# Patient Record
Sex: Female | Born: 1960 | Race: Black or African American | Hispanic: No | Marital: Single | Smoking: Never smoker
Health system: Southern US, Community
[De-identification: ages and names within clinical notes are randomized; demographics above are authoritative.]

## PROBLEM LIST (undated history)

## (undated) DIAGNOSIS — I509 Heart failure, unspecified: Secondary | ICD-10-CM

## (undated) DIAGNOSIS — E119 Type 2 diabetes mellitus without complications: Secondary | ICD-10-CM

## (undated) DIAGNOSIS — I4719 Other supraventricular tachycardia: Secondary | ICD-10-CM

## (undated) DIAGNOSIS — E785 Hyperlipidemia, unspecified: Secondary | ICD-10-CM

## (undated) DIAGNOSIS — B009 Herpesviral infection, unspecified: Secondary | ICD-10-CM

## (undated) DIAGNOSIS — I10 Essential (primary) hypertension: Secondary | ICD-10-CM

## (undated) DIAGNOSIS — R197 Diarrhea, unspecified: Secondary | ICD-10-CM

## (undated) DIAGNOSIS — I519 Heart disease, unspecified: Secondary | ICD-10-CM

## (undated) HISTORY — DX: Heart disease, unspecified: I51.9

## (undated) HISTORY — DX: Diarrhea, unspecified: R19.7

## (undated) HISTORY — DX: Hyperlipidemia, unspecified: E78.5

## (undated) HISTORY — PX: CHOLECYSTECTOMY: SHX55

## (undated) HISTORY — DX: Essential (primary) hypertension: I10

## (undated) HISTORY — DX: Type 2 diabetes mellitus without complications: E11.9

---

## 2001-09-28 ENCOUNTER — Encounter: Payer: Self-pay | Admitting: Orthopaedic Surgery

## 2001-09-28 ENCOUNTER — Ambulatory Visit (HOSPITAL_COMMUNITY): Admission: RE | Admit: 2001-09-28 | Discharge: 2001-09-28 | Payer: Self-pay | Admitting: Orthopaedic Surgery

## 2003-09-20 ENCOUNTER — Emergency Department (HOSPITAL_COMMUNITY): Admission: EM | Admit: 2003-09-20 | Discharge: 2003-09-20 | Payer: Self-pay | Admitting: Emergency Medicine

## 2004-10-30 ENCOUNTER — Emergency Department (HOSPITAL_COMMUNITY): Admission: EM | Admit: 2004-10-30 | Discharge: 2004-10-30 | Payer: Self-pay | Admitting: Emergency Medicine

## 2005-03-28 ENCOUNTER — Ambulatory Visit (HOSPITAL_COMMUNITY): Admission: RE | Admit: 2005-03-28 | Discharge: 2005-03-28 | Payer: Self-pay | Admitting: Family Medicine

## 2006-01-12 ENCOUNTER — Emergency Department (HOSPITAL_COMMUNITY): Admission: EM | Admit: 2006-01-12 | Discharge: 2006-01-12 | Payer: Self-pay | Admitting: Emergency Medicine

## 2006-01-14 ENCOUNTER — Ambulatory Visit (HOSPITAL_COMMUNITY): Admission: RE | Admit: 2006-01-14 | Discharge: 2006-01-14 | Payer: Self-pay | Admitting: Internal Medicine

## 2006-03-31 ENCOUNTER — Ambulatory Visit (HOSPITAL_COMMUNITY): Admission: RE | Admit: 2006-03-31 | Discharge: 2006-03-31 | Payer: Self-pay | Admitting: Internal Medicine

## 2007-04-06 ENCOUNTER — Ambulatory Visit (HOSPITAL_COMMUNITY): Admission: RE | Admit: 2007-04-06 | Discharge: 2007-04-06 | Payer: Self-pay | Admitting: Internal Medicine

## 2007-08-06 ENCOUNTER — Emergency Department (HOSPITAL_COMMUNITY): Admission: EM | Admit: 2007-08-06 | Discharge: 2007-08-06 | Payer: Self-pay | Admitting: Emergency Medicine

## 2007-12-03 ENCOUNTER — Ambulatory Visit (HOSPITAL_COMMUNITY): Admission: RE | Admit: 2007-12-03 | Discharge: 2007-12-03 | Payer: Self-pay | Admitting: Internal Medicine

## 2008-04-06 ENCOUNTER — Ambulatory Visit (HOSPITAL_COMMUNITY): Admission: RE | Admit: 2008-04-06 | Discharge: 2008-04-06 | Payer: Self-pay | Admitting: Internal Medicine

## 2009-04-11 ENCOUNTER — Ambulatory Visit (HOSPITAL_COMMUNITY): Admission: RE | Admit: 2009-04-11 | Discharge: 2009-04-11 | Payer: Self-pay | Admitting: Internal Medicine

## 2010-04-26 ENCOUNTER — Ambulatory Visit (HOSPITAL_COMMUNITY): Admission: RE | Admit: 2010-04-26 | Discharge: 2010-04-26 | Payer: Self-pay | Admitting: Internal Medicine

## 2011-03-27 ENCOUNTER — Emergency Department (HOSPITAL_COMMUNITY)
Admission: EM | Admit: 2011-03-27 | Discharge: 2011-03-27 | Disposition: A | Discharge status: Home / Self Care | Payer: Medicare Other | Attending: Emergency Medicine | Admitting: Emergency Medicine

## 2011-03-27 ENCOUNTER — Emergency Department (HOSPITAL_COMMUNITY): Payer: Medicare Other

## 2011-03-27 DIAGNOSIS — R1013 Epigastric pain: Secondary | ICD-10-CM | POA: Insufficient documentation

## 2011-04-04 ENCOUNTER — Other Ambulatory Visit (HOSPITAL_COMMUNITY): Payer: Self-pay | Admitting: "Endocrinology

## 2011-04-04 DIAGNOSIS — K8020 Calculus of gallbladder without cholecystitis without obstruction: Secondary | ICD-10-CM

## 2011-04-09 ENCOUNTER — Ambulatory Visit (HOSPITAL_COMMUNITY)
Admission: RE | Admit: 2011-04-09 | Discharge: 2011-04-09 | Disposition: A | Discharge status: Home / Self Care | Payer: Medicare Other | Source: Ambulatory Visit | Attending: "Endocrinology | Admitting: "Endocrinology

## 2011-04-09 DIAGNOSIS — K8020 Calculus of gallbladder without cholecystitis without obstruction: Secondary | ICD-10-CM | POA: Insufficient documentation

## 2011-04-09 DIAGNOSIS — R109 Unspecified abdominal pain: Secondary | ICD-10-CM | POA: Insufficient documentation

## 2011-04-10 ENCOUNTER — Other Ambulatory Visit: Payer: Self-pay | Admitting: Obstetrics and Gynecology

## 2011-04-10 ENCOUNTER — Other Ambulatory Visit (HOSPITAL_COMMUNITY)
Admission: RE | Admit: 2011-04-10 | Discharge: 2011-04-10 | Disposition: A | Discharge status: Home / Self Care | Payer: Medicare Other | Source: Ambulatory Visit | Attending: Obstetrics and Gynecology | Admitting: Obstetrics and Gynecology

## 2011-04-10 DIAGNOSIS — Z124 Encounter for screening for malignant neoplasm of cervix: Secondary | ICD-10-CM | POA: Insufficient documentation

## 2011-05-03 ENCOUNTER — Other Ambulatory Visit: Payer: Self-pay | Admitting: General Surgery

## 2011-05-03 ENCOUNTER — Encounter (HOSPITAL_COMMUNITY): Payer: Medicare Other

## 2011-05-03 ENCOUNTER — Other Ambulatory Visit: Payer: Self-pay | Admitting: Infectious Diseases

## 2011-05-03 LAB — BASIC METABOLIC PANEL
CO2: 30 mEq/L (ref 19–32)
Calcium: 9.8 mg/dL (ref 8.4–10.5)
Glucose, Bld: 121 mg/dL — ABNORMAL HIGH (ref 70–99)
Potassium: 4.2 mEq/L (ref 3.5–5.1)
Sodium: 138 mEq/L (ref 135–145)

## 2011-05-03 LAB — CBC
MCH: 29.4 pg (ref 26.0–34.0)
MCHC: 33.3 g/dL (ref 30.0–36.0)
Platelets: 308 10*3/uL (ref 150–400)
RDW: 13.3 % (ref 11.5–15.5)

## 2011-05-03 LAB — HEPATIC FUNCTION PANEL
Albumin: 3.8 g/dL (ref 3.5–5.2)
Indirect Bilirubin: 0.1 mg/dL — ABNORMAL LOW (ref 0.3–0.9)
Total Protein: 7.7 g/dL (ref 6.0–8.3)

## 2011-05-03 LAB — HCG, QUANTITATIVE, PREGNANCY: hCG, Beta Chain, Quant, S: 1 m[IU]/mL (ref <5)

## 2011-05-03 LAB — SURGICAL PCR SCREEN
MRSA, PCR: NEGATIVE
Staphylococcus aureus: POSITIVE — AB

## 2011-05-07 NOTE — H&P (Signed)
  NAMELAURINE, Michelle Morrison                ACCOUNT NO.:  [id number]  MEDICAL RECORD NO.:  [id number]  LOCATION:                                 FACILITY:  PHYSICIAN:  Dalia Heading, M.D.  DATE OF BIRTH:  Sep 26, 1961  DATE OF ADMISSION: DATE OF DISCHARGE:  LH                             HISTORY & PHYSICAL   CHIEF COMPLAINTS:  Gallstones.  HISTORY OF PRESENT ILLNESS:  The patient is a 50 year old black female who presents with worsening right upper quadrant abdominal pain.  This is sometimes made worse with fatty foods.  The pain radiates to the right flank.  No fever, chills, or jaundice have been noted.  PAST MEDICAL HISTORY: 1. Hypertension. 2. High cholesterol levels.  PAST SURGICAL HISTORY:  Unremarkable.  CURRENT MEDICATIONS:  Lisinopril, clonidine, and simvastatin.  ALLERGIES:  No known drug allergies.  REVIEW OF SYSTEMS:  The patient denies drinking or smoking.  She denies any other cardiopulmonary difficulties or bleeding disorders.  FAMILY MEDICAL HISTORY:  Noncontributory.  PHYSICAL EXAMINATION:  GENERAL:  The patient is a well-developed, well- nourished black female in no acute distress. VITAL SIGNS:  She is afebrile with blood pressure that is mildly elevated. HEENT:  No scleral icterus. LUNGS:  Clear to auscultation with equal breath sounds bilaterally. HEART:  Regular rate and rhythm without S3, S4, or murmurs. ABDOMEN:  Soft with tenderness to palpation in the right upper quadrant. No hepatosplenomegaly or hernias are noted.  Ultrasound of the gallbladder reveals cholelithiasis with a normal common bile duct.  IMPRESSION:  Biliary colic, cholelithiasis.  PLAN:  The patient is scheduled for laparoscopic cholecystectomy on May 08, 2011.  Risks and benefits of the procedure including bleeding, infection, hepatobiliary injury, and possibility of an open procedure were fully explained to the patient, gave informed consent.     Dalia Heading,  M.D.     MAJ/MEDQ  D:  04/16/2011  T:  04/17/2011  Job:  [id number]  cc:   Purcell Nails, MD Fax: [phone]  Short Stay at [facility]  Electronically Signed by Franky Macho M.D. on 05/07/2011 12:28:06 PM

## 2011-05-08 ENCOUNTER — Ambulatory Visit (HOSPITAL_COMMUNITY)
Admission: RE | Admit: 2011-05-08 | Discharge: 2011-05-08 | Disposition: A | Discharge status: Home / Self Care | Payer: Medicare Other | Source: Ambulatory Visit | Attending: General Surgery | Admitting: General Surgery

## 2011-05-08 ENCOUNTER — Other Ambulatory Visit: Payer: Self-pay | Admitting: General Surgery

## 2011-05-08 DIAGNOSIS — E7800 Pure hypercholesterolemia, unspecified: Secondary | ICD-10-CM | POA: Insufficient documentation

## 2011-05-08 DIAGNOSIS — Z79899 Other long term (current) drug therapy: Secondary | ICD-10-CM | POA: Insufficient documentation

## 2011-05-08 DIAGNOSIS — Z01812 Encounter for preprocedural laboratory examination: Secondary | ICD-10-CM | POA: Insufficient documentation

## 2011-05-08 DIAGNOSIS — K8010 Calculus of gallbladder with chronic cholecystitis without obstruction: Secondary | ICD-10-CM | POA: Insufficient documentation

## 2011-05-08 DIAGNOSIS — I10 Essential (primary) hypertension: Secondary | ICD-10-CM | POA: Insufficient documentation

## 2011-05-08 DIAGNOSIS — Z01810 Encounter for preprocedural cardiovascular examination: Secondary | ICD-10-CM | POA: Insufficient documentation

## 2011-05-20 NOTE — Op Note (Signed)
NAMESHAMARRA, WARDA                ACCOUNT NO.:  [id number]  MEDICAL RECORD NO.:  [id number]  LOCATION:  [facility]                          FACILITY:  [facility]  PHYSICIAN:  Dalia Heading, M.D.  DATE OF BIRTH:  [birth year]  DATE OF PROCEDURE:  05/08/2011 DATE OF DISCHARGE:                              OPERATIVE REPORT   PREOPERATIVE DIAGNOSES: 1. Cholecystitis. 2. Cholelithiasis.  POSTOPERATIVE DIAGNOSES: 1. Cholecystitis. 2. Cholelithiasis.  PROCEDURE:  Laparoscopic cholecystectomy.  SURGEON:  Dalia Heading, MD  ANESTHESIA:  General endotracheal.  INDICATIONS:  The patient is a 50 year old black female, presents with biliary colic secondary to cholelithiasis.  The risks and benefits of the procedure including bleeding, infection, hepatobiliary injury, and the possibility of an open procedure were fully explained to the patient, gave informed consent.  PROCEDURE NOTE:  The patient was placed in the supine position.  After induction of general endotracheal anesthesia, the abdomen was prepped and draped using the usual sterile technique with DuraPrep.  Surgical site confirmation was performed.  An infraumbilical incision was made down to the fascia.  A Veress needle was introduced into the abdominal cavity and confirmation of placement was done using the saline drop test.  The abdomen was then insufflated to 16 mmHg pressure.  An 11-mm trocar was introduced into the abdominal cavity under direct visualization without difficulty.  The patient was placed in reversed Trendelenburg position.  An additional 11-mm trocar was placed in the epigastric region and 5-mm trocars were placed in the right upper quadrant and right flank regions.  Liver was inspected and noted to be within normal limits.  The gallbladder was retracted superiorly and laterally.  The dissection was begun around the infundibulum of the gallbladder.  The cystic duct was first identified. Its juncture to the  infundibulum was fully identified.  EndoClips were placed proximally and distally on the cystic duct and the cystic duct was divided.  This is likewise done in the cystic artery.  The gallbladder was then freed away from the gallbladder fossa using Bovie electrocautery.  The gallbladder was delivered through the epigastric trocar site using EndoCatch bag.  There was some spillage of stones and these were collected using a second EndoCatch bag.  The gallbladder fossa was inspected.  No abnormal bleeding or bile leakage was noted. Surgicel was placed in the gallbladder fossa.  All fluid and air were then evacuated from the abdominal cavity prior to removal of the trocars.  All wounds were irrigated with normal saline.  All wounds were injected with 0.5 Sensorcaine.  The infraumbilical fascia was reapproximated using an 0 Vicryl interrupted suture.  The epigastric fascia was reapproximated using an 0 Vicryl interrupted suture.  All skin incisions were closed using staples.  Betadine ointment and dry sterile dressings were applied.  All tape and needle counts were correct at the end of the procedure. The patient was extubated in the operating room and went back to recovery room, awake in stable condition.  COMPLICATIONS:  None.  SPECIMEN:  Gallbladder.  ESTIMATED BLOOD LOSS:  Minimal.     Dalia Heading, M.D.     MAJ/MEDQ  D:  05/08/2011  T:  05/08/2011  Job:  [id number]  cc:   Purcell Nails, MD Fax: [phone]  Electronically Signed by Franky Macho M.D. on 05/20/2011 07:27:51 AM

## 2011-09-18 ENCOUNTER — Other Ambulatory Visit (HOSPITAL_COMMUNITY): Payer: Self-pay | Admitting: Internal Medicine

## 2011-09-18 DIAGNOSIS — Z139 Encounter for screening, unspecified: Secondary | ICD-10-CM

## 2011-09-23 ENCOUNTER — Ambulatory Visit (HOSPITAL_COMMUNITY): Payer: Medicare Other

## 2011-09-23 ENCOUNTER — Ambulatory Visit (HOSPITAL_COMMUNITY)
Admission: RE | Admit: 2011-09-23 | Discharge: 2011-09-23 | Disposition: A | Discharge status: Home / Self Care | Payer: Medicare Other | Source: Ambulatory Visit | Attending: Internal Medicine | Admitting: Internal Medicine

## 2011-09-23 DIAGNOSIS — Z1231 Encounter for screening mammogram for malignant neoplasm of breast: Secondary | ICD-10-CM | POA: Insufficient documentation

## 2011-09-23 DIAGNOSIS — Z139 Encounter for screening, unspecified: Secondary | ICD-10-CM

## 2012-04-28 ENCOUNTER — Other Ambulatory Visit (HOSPITAL_COMMUNITY): Payer: Self-pay | Admitting: "Endocrinology

## 2012-04-28 DIAGNOSIS — R109 Unspecified abdominal pain: Secondary | ICD-10-CM | POA: Diagnosis not present

## 2012-04-30 ENCOUNTER — Ambulatory Visit (HOSPITAL_COMMUNITY)
Admission: RE | Admit: 2012-04-30 | Discharge: 2012-04-30 | Disposition: A | Discharge status: Home / Self Care | Payer: Medicare Other | Source: Ambulatory Visit | Attending: "Endocrinology | Admitting: "Endocrinology

## 2012-04-30 DIAGNOSIS — K769 Liver disease, unspecified: Secondary | ICD-10-CM | POA: Diagnosis not present

## 2012-04-30 DIAGNOSIS — R935 Abnormal findings on diagnostic imaging of other abdominal regions, including retroperitoneum: Secondary | ICD-10-CM | POA: Insufficient documentation

## 2012-04-30 DIAGNOSIS — R109 Unspecified abdominal pain: Secondary | ICD-10-CM | POA: Insufficient documentation

## 2012-04-30 DIAGNOSIS — Z9089 Acquired absence of other organs: Secondary | ICD-10-CM | POA: Diagnosis not present

## 2012-05-04 ENCOUNTER — Other Ambulatory Visit (HOSPITAL_COMMUNITY): Payer: Self-pay | Admitting: "Endocrinology

## 2012-05-04 DIAGNOSIS — J309 Allergic rhinitis, unspecified: Secondary | ICD-10-CM | POA: Diagnosis not present

## 2012-05-04 DIAGNOSIS — R109 Unspecified abdominal pain: Secondary | ICD-10-CM

## 2012-05-04 DIAGNOSIS — E119 Type 2 diabetes mellitus without complications: Secondary | ICD-10-CM | POA: Diagnosis not present

## 2012-05-07 ENCOUNTER — Ambulatory Visit (HOSPITAL_COMMUNITY)
Admission: RE | Admit: 2012-05-07 | Discharge: 2012-05-07 | Disposition: A | Discharge status: Home / Self Care | Payer: Medicare Other | Source: Ambulatory Visit | Attending: "Endocrinology | Admitting: "Endocrinology

## 2012-05-07 DIAGNOSIS — R109 Unspecified abdominal pain: Secondary | ICD-10-CM

## 2012-05-07 MED ORDER — IOHEXOL 300 MG/ML  SOLN
100.0000 mL | Freq: Once | INTRAMUSCULAR | Status: AC | PRN
  Administered 2012-05-07: 100 mL via INTRAVENOUS

## 2012-05-20 DIAGNOSIS — M25579 Pain in unspecified ankle and joints of unspecified foot: Secondary | ICD-10-CM | POA: Diagnosis not present

## 2012-08-10 DIAGNOSIS — E7800 Pure hypercholesterolemia, unspecified: Secondary | ICD-10-CM | POA: Diagnosis not present

## 2012-08-10 DIAGNOSIS — I10 Essential (primary) hypertension: Secondary | ICD-10-CM | POA: Diagnosis not present

## 2012-08-10 DIAGNOSIS — Z23 Encounter for immunization: Secondary | ICD-10-CM | POA: Diagnosis not present

## 2012-08-10 DIAGNOSIS — E119 Type 2 diabetes mellitus without complications: Secondary | ICD-10-CM | POA: Diagnosis not present

## 2012-08-26 ENCOUNTER — Other Ambulatory Visit (HOSPITAL_COMMUNITY): Payer: Self-pay | Admitting: Internal Medicine

## 2012-08-26 DIAGNOSIS — Z139 Encounter for screening, unspecified: Secondary | ICD-10-CM

## 2012-09-24 ENCOUNTER — Ambulatory Visit (HOSPITAL_COMMUNITY)
Admission: RE | Admit: 2012-09-24 | Discharge: 2012-09-24 | Disposition: A | Discharge status: Home / Self Care | Payer: Medicare Other | Source: Ambulatory Visit | Attending: Internal Medicine | Admitting: Internal Medicine

## 2012-09-24 DIAGNOSIS — Z1231 Encounter for screening mammogram for malignant neoplasm of breast: Secondary | ICD-10-CM | POA: Diagnosis not present

## 2012-09-24 DIAGNOSIS — Z139 Encounter for screening, unspecified: Secondary | ICD-10-CM

## 2012-11-09 DIAGNOSIS — I10 Essential (primary) hypertension: Secondary | ICD-10-CM | POA: Diagnosis not present

## 2012-11-09 DIAGNOSIS — E119 Type 2 diabetes mellitus without complications: Secondary | ICD-10-CM | POA: Diagnosis not present

## 2012-11-09 DIAGNOSIS — E7800 Pure hypercholesterolemia, unspecified: Secondary | ICD-10-CM | POA: Diagnosis not present

## 2012-12-09 DIAGNOSIS — E119 Type 2 diabetes mellitus without complications: Secondary | ICD-10-CM | POA: Diagnosis not present

## 2013-02-08 DIAGNOSIS — E119 Type 2 diabetes mellitus without complications: Secondary | ICD-10-CM | POA: Diagnosis not present

## 2013-02-08 DIAGNOSIS — I10 Essential (primary) hypertension: Secondary | ICD-10-CM | POA: Diagnosis not present

## 2013-02-08 DIAGNOSIS — E7800 Pure hypercholesterolemia, unspecified: Secondary | ICD-10-CM | POA: Diagnosis not present

## 2013-03-31 ENCOUNTER — Encounter: Payer: Self-pay | Admitting: *Deleted

## 2013-04-01 ENCOUNTER — Encounter: Payer: Self-pay | Admitting: Obstetrics and Gynecology

## 2013-04-01 ENCOUNTER — Other Ambulatory Visit (HOSPITAL_COMMUNITY)
Admission: RE | Admit: 2013-04-01 | Discharge: 2013-04-01 | Disposition: A | Discharge status: Home / Self Care | Payer: Medicare Other | Source: Ambulatory Visit | Attending: Obstetrics and Gynecology | Admitting: Obstetrics and Gynecology

## 2013-04-01 ENCOUNTER — Ambulatory Visit (INDEPENDENT_AMBULATORY_CARE_PROVIDER_SITE_OTHER): Payer: Medicare Other | Admitting: Obstetrics and Gynecology

## 2013-04-01 ENCOUNTER — Telehealth: Payer: Self-pay | Admitting: Internal Medicine

## 2013-04-01 VITALS — BP 170/98 | Ht 62.0 in | Wt 172.4 lb

## 2013-04-01 DIAGNOSIS — Z01419 Encounter for gynecological examination (general) (routine) without abnormal findings: Secondary | ICD-10-CM | POA: Diagnosis not present

## 2013-04-01 DIAGNOSIS — R87810 Cervical high risk human papillomavirus (HPV) DNA test positive: Secondary | ICD-10-CM | POA: Diagnosis not present

## 2013-04-01 DIAGNOSIS — Z1212 Encounter for screening for malignant neoplasm of rectum: Secondary | ICD-10-CM | POA: Diagnosis not present

## 2013-04-01 DIAGNOSIS — Z0000 Encounter for general adult medical examination without abnormal findings: Secondary | ICD-10-CM

## 2013-04-01 DIAGNOSIS — Z124 Encounter for screening for malignant neoplasm of cervix: Secondary | ICD-10-CM

## 2013-04-01 DIAGNOSIS — I10 Essential (primary) hypertension: Secondary | ICD-10-CM

## 2013-04-01 DIAGNOSIS — Z1151 Encounter for screening for human papillomavirus (HPV): Secondary | ICD-10-CM | POA: Insufficient documentation

## 2013-04-01 LAB — HEMOCCULT GUIAC POC 1CARD (OFFICE)

## 2013-04-01 NOTE — Progress Notes (Signed)
  Subjective:     Michelle Morrison is a 52 y.o. female here for a routine exam.  Current complaints: taking BP meds.  Personal health questionnaire reviewed: no.   Gynecologic History No LMP recorded. Patient is postmenopausal. Contraception: abstinence Last Pap: . Results were: normal Last mammogram: . Results were:   Obstetric History OB History   Grav Para Term Preterm Abortions TAB SAB Ect Mult Living   2 2             # Outc Date GA Lbr Len/2nd Wgt Sex Del Anes PTL Lv   1 PAR            2 PAR                  Review of Systems Pertinent items are noted in HPI.    Objective:    General appearance: alert, cooperative and no distress Head: Normocephalic, without obvious abnormality, atraumatic Throat: lips, mucosa, and tongue normal; teeth and gums normal Lungs: clear to auscultation bilaterally Breasts: normal appearance, no masses or tenderness Heart: regular rate and rhythm, S1, S2 normal, no murmur, click, rub or gallop Abdomen: normal findings: bowel sounds normal, no masses palpable and no organomegaly Pelvic: cervix normal in appearance, external genitalia normal, no adnexal masses or tenderness, no cervical motion tenderness, rectovaginal septum normal, uterus normal size, shape, and consistency and vagina normal without discharge Extremities: extremities normal, atraumatic, no cyanosis or edema    Assessment:    Healthy female exam.    Plan:    Follow up in: 2 years.

## 2013-04-01 NOTE — Patient Instructions (Addendum)
Be sure you take your bp meds. Today's BP is 170/98 That is too high.

## 2013-04-04 NOTE — Telephone Encounter (Signed)
Opened in error

## 2013-05-31 DIAGNOSIS — I10 Essential (primary) hypertension: Secondary | ICD-10-CM | POA: Diagnosis not present

## 2013-05-31 DIAGNOSIS — E119 Type 2 diabetes mellitus without complications: Secondary | ICD-10-CM | POA: Diagnosis not present

## 2013-08-26 ENCOUNTER — Other Ambulatory Visit (HOSPITAL_COMMUNITY): Payer: Self-pay | Admitting: Internal Medicine

## 2013-08-26 DIAGNOSIS — Z139 Encounter for screening, unspecified: Secondary | ICD-10-CM

## 2013-09-02 DIAGNOSIS — I10 Essential (primary) hypertension: Secondary | ICD-10-CM | POA: Diagnosis not present

## 2013-09-02 DIAGNOSIS — Z23 Encounter for immunization: Secondary | ICD-10-CM | POA: Diagnosis not present

## 2013-09-02 DIAGNOSIS — E119 Type 2 diabetes mellitus without complications: Secondary | ICD-10-CM | POA: Diagnosis not present

## 2013-09-27 ENCOUNTER — Ambulatory Visit (HOSPITAL_COMMUNITY)
Admission: RE | Admit: 2013-09-27 | Discharge: 2013-09-27 | Disposition: A | Discharge status: Home / Self Care | Payer: Medicare Other | Source: Ambulatory Visit | Attending: Internal Medicine | Admitting: Internal Medicine

## 2013-09-27 DIAGNOSIS — Z139 Encounter for screening, unspecified: Secondary | ICD-10-CM

## 2013-09-27 DIAGNOSIS — Z1231 Encounter for screening mammogram for malignant neoplasm of breast: Secondary | ICD-10-CM | POA: Diagnosis not present

## 2013-12-16 DIAGNOSIS — E669 Obesity, unspecified: Secondary | ICD-10-CM | POA: Diagnosis not present

## 2013-12-16 DIAGNOSIS — E7800 Pure hypercholesterolemia, unspecified: Secondary | ICD-10-CM | POA: Diagnosis not present

## 2013-12-16 DIAGNOSIS — E119 Type 2 diabetes mellitus without complications: Secondary | ICD-10-CM | POA: Diagnosis not present

## 2013-12-16 DIAGNOSIS — I10 Essential (primary) hypertension: Secondary | ICD-10-CM | POA: Diagnosis not present

## 2014-03-17 DIAGNOSIS — E119 Type 2 diabetes mellitus without complications: Secondary | ICD-10-CM | POA: Diagnosis not present

## 2014-03-17 DIAGNOSIS — I10 Essential (primary) hypertension: Secondary | ICD-10-CM | POA: Diagnosis not present

## 2014-06-16 DIAGNOSIS — I10 Essential (primary) hypertension: Secondary | ICD-10-CM | POA: Diagnosis not present

## 2014-06-16 DIAGNOSIS — IMO0001 Reserved for inherently not codable concepts without codable children: Secondary | ICD-10-CM | POA: Diagnosis not present

## 2014-06-20 DIAGNOSIS — L259 Unspecified contact dermatitis, unspecified cause: Secondary | ICD-10-CM | POA: Diagnosis not present

## 2014-07-25 ENCOUNTER — Other Ambulatory Visit: Payer: Self-pay

## 2014-07-25 ENCOUNTER — Telehealth: Payer: Self-pay

## 2014-07-25 DIAGNOSIS — Z1211 Encounter for screening for malignant neoplasm of colon: Secondary | ICD-10-CM

## 2014-07-25 NOTE — Telephone Encounter (Signed)
Pt received letter to be triaged. Please call her back at [phone]

## 2014-07-25 NOTE — Telephone Encounter (Signed)
Gastroenterology Pre-Procedure Review  Request Date: 07/25/2014  Requesting Physician: Dr. Legrand Rams  PATIENT REVIEW QUESTIONS: The patient responded to the following health history questions as indicated:    1. Diabetes Melitis: YES 2. Joint replacements in the past 12 months: no 3. Major health problems in the past 3 months: no 4. Has an artificial valve or MVP: no 5. Has a defibrillator: no 6. Has been advised in past to take antibiotics in advance of a procedure like teeth cleaning: no    MEDICATIONS & ALLERGIES:    Patient reports the following regarding taking any blood thinners:   Plavix? no Aspirin? no Coumadin? no  Patient confirms/reports the following medications:  Current Outpatient Prescriptions  Medication Sig Dispense Refill  . amLODipine (NORVASC) 10 MG tablet Take 10 mg by mouth daily.      . hydrochlorothiazide (HYDRODIURIL) 12.5 MG tablet Take 12.5 mg by mouth daily.      Marland Kitchen lisinopril (PRINIVIL,ZESTRIL) 20 MG tablet Take 20 mg by mouth daily.      . metFORMIN (GLUCOPHAGE) 500 MG tablet Take 500 mg by mouth 2 (two) times daily with a meal.      . simvastatin (ZOCOR) 40 MG tablet Take 40 mg by mouth daily.       No current facility-administered medications for this visit.    Patient confirms/reports the following allergies:  No Known Allergies  No orders of the defined types were placed in this encounter.    AUTHORIZATION INFORMATION Primary Insurance:   ID #:   Group #:  Pre-Cert / Auth required:  Pre-Cert / Auth #:   Secondary Insurance:   ID #:  Group #:  Pre-Cert / Auth required:  Pre-Cert / Auth #:   SCHEDULE INFORMATION: Procedure has been scheduled as follows:  Date:  08/26/2014                       Time: 9:30 AM Location: [facility] Short Stay  This Gastroenterology Pre-Precedure Review Form is being routed to the following provider(s): Barney Drain, MD

## 2014-07-26 NOTE — Telephone Encounter (Signed)
MOVI PREP SPLIT DOSING, REGULAR BREAKFAST. CLEAR LIQUIDS AFTER 9 AM.  CONTINUE GLUCOPHAGE.  HOLD HCTZ ON DAY BEFORE AND DAY OF TCS.

## 2014-07-27 MED ORDER — PEG-KCL-NACL-NASULF-NA ASC-C 100 G PO SOLR: 1.0000 | ORAL | Status: DC

## 2014-07-27 NOTE — Telephone Encounter (Signed)
Rx sent to the pharmacy and instructions mailed to pt.  

## 2014-07-27 NOTE — Addendum Note (Signed)
Addended by: Everardo All on: 07/27/2014 11:43 AM   Modules accepted: Orders

## 2014-08-02 ENCOUNTER — Telehealth: Payer: Self-pay

## 2014-08-02 MED ORDER — PEG 3350-KCL-NA BICARB-NACL 420 G PO SOLR: 4000.0000 mL | ORAL | Status: DC

## 2014-08-02 NOTE — Telephone Encounter (Signed)
Pt cannot afford the Movie Prep. I told her I will send in the [facility] and she will call and let me know if she can afford that and I will mail the instructions for that. She said she may have to reshedule to Nov but she will let me know.

## 2014-08-02 NOTE — Addendum Note (Signed)
Addended by: Everardo All on: 08/02/2014 12:16 PM   Modules accepted: Orders

## 2014-08-02 NOTE — Telephone Encounter (Signed)
REVIEWED. AGREE. NO ADDITIONAL RECOMMENDATIONS. 

## 2014-08-02 NOTE — Telephone Encounter (Signed)
Please call patient regarding her colonoscopy .  She called 08/02/14 [phone] cell [phone]  Has question about a medication she was trying to get filled.

## 2014-08-09 NOTE — OR Nursing (Signed)
Patient stated she was scheduled for a colonoscopy on 10/2. Patient brought medication list to the hospital today to make sure we had the correct list. List updated in [facility].

## 2014-08-22 ENCOUNTER — Encounter (HOSPITAL_COMMUNITY): Payer: Self-pay | Admitting: Pharmacy Technician

## 2014-08-26 ENCOUNTER — Ambulatory Visit (HOSPITAL_COMMUNITY)
Admission: RE | Admit: 2014-08-26 | Discharge: 2014-08-26 | Disposition: A | Discharge status: Home / Self Care | Payer: Medicare Other | Source: Ambulatory Visit | Attending: Gastroenterology | Admitting: Gastroenterology

## 2014-08-26 ENCOUNTER — Encounter (HOSPITAL_COMMUNITY): Payer: Self-pay | Admitting: *Deleted

## 2014-08-26 ENCOUNTER — Encounter (HOSPITAL_COMMUNITY)
Admission: RE | Disposition: A | Discharge status: Home / Self Care | Payer: Self-pay | Source: Ambulatory Visit | Attending: Gastroenterology

## 2014-08-26 DIAGNOSIS — Q438 Other specified congenital malformations of intestine: Secondary | ICD-10-CM | POA: Diagnosis not present

## 2014-08-26 DIAGNOSIS — I10 Essential (primary) hypertension: Secondary | ICD-10-CM | POA: Diagnosis not present

## 2014-08-26 DIAGNOSIS — Z79899 Other long term (current) drug therapy: Secondary | ICD-10-CM | POA: Diagnosis not present

## 2014-08-26 DIAGNOSIS — K635 Polyp of colon: Secondary | ICD-10-CM | POA: Diagnosis not present

## 2014-08-26 DIAGNOSIS — E119 Type 2 diabetes mellitus without complications: Secondary | ICD-10-CM | POA: Insufficient documentation

## 2014-08-26 DIAGNOSIS — Z1211 Encounter for screening for malignant neoplasm of colon: Secondary | ICD-10-CM | POA: Diagnosis not present

## 2014-08-26 DIAGNOSIS — E785 Hyperlipidemia, unspecified: Secondary | ICD-10-CM | POA: Diagnosis not present

## 2014-08-26 DIAGNOSIS — D125 Benign neoplasm of sigmoid colon: Secondary | ICD-10-CM | POA: Insufficient documentation

## 2014-08-26 DIAGNOSIS — K648 Other hemorrhoids: Secondary | ICD-10-CM | POA: Diagnosis not present

## 2014-08-26 HISTORY — PX: COLONOSCOPY: SHX5424

## 2014-08-26 LAB — GLUCOSE, CAPILLARY: Glucose-Capillary: 108 mg/dL — ABNORMAL HIGH (ref 70–99)

## 2014-08-26 SURGERY — COLONOSCOPY
Anesthesia: Moderate Sedation

## 2014-08-26 MED ORDER — SODIUM CHLORIDE 0.9 % IV SOLN
INTRAVENOUS | Status: DC
  Administered 2014-08-26: 08:00:00 via INTRAVENOUS

## 2014-08-26 MED ORDER — PROMETHAZINE HCL 25 MG/ML IJ SOLN
INTRAMUSCULAR | Status: DC | PRN
  Administered 2014-08-26: 12.5 mg via INTRAVENOUS

## 2014-08-26 MED ORDER — PROMETHAZINE HCL 25 MG/ML IJ SOLN
INTRAMUSCULAR | Status: AC
  Filled 2014-08-26: qty 1

## 2014-08-26 MED ORDER — HYDRALAZINE HCL 20 MG/ML IJ SOLN
10.0000 mg | Freq: Once | INTRAMUSCULAR | Status: AC
  Administered 2014-08-26: 10 mg via INTRAVENOUS

## 2014-08-26 MED ORDER — STERILE WATER FOR IRRIGATION IR SOLN
Status: DC | PRN
  Administered 2014-08-26: 09:00:00

## 2014-08-26 MED ORDER — MEPERIDINE HCL 100 MG/ML IJ SOLN
INTRAMUSCULAR | Status: DC | PRN
  Administered 2014-08-26 (×4): 25 mg via INTRAVENOUS

## 2014-08-26 MED ORDER — SODIUM CHLORIDE 0.9 % IJ SOLN
INTRAMUSCULAR | Status: AC
  Filled 2014-08-26: qty 10

## 2014-08-26 MED ORDER — HYDRALAZINE HCL 20 MG/ML IJ SOLN
INTRAMUSCULAR | Status: AC
  Filled 2014-08-26: qty 1

## 2014-08-26 MED ORDER — MIDAZOLAM HCL 5 MG/5ML IJ SOLN
INTRAMUSCULAR | Status: AC
  Filled 2014-08-26: qty 10

## 2014-08-26 MED ORDER — MEPERIDINE HCL 100 MG/ML IJ SOLN
INTRAMUSCULAR | Status: DC
Start: 2014-08-26 — End: 2014-08-26
  Filled 2014-08-26: qty 2

## 2014-08-26 MED ORDER — MIDAZOLAM HCL 5 MG/5ML IJ SOLN
INTRAMUSCULAR | Status: DC | PRN
  Administered 2014-08-26: 1 mg via INTRAVENOUS
  Administered 2014-08-26 (×2): 2 mg via INTRAVENOUS

## 2014-08-26 NOTE — Discharge Instructions (Signed)
You had 1 polyp removed. You have internal hemorrhoids.   FOLLOW A HIGH FIBER DIET. AVOID ITEMS THAT CAUSE BLOATING & GAS. SEE INFO BELOW.  YOUR BIOPSY RESULTS SHOULD BE BACK IN 14 DAYS.  Next colonoscopy in 5-10 years.    Colonoscopy Care After Read the instructions outlined below and refer to this sheet in the next week. These discharge instructions provide you with general information on caring for yourself after you leave the hospital. While your treatment has been planned according to the most current medical practices available, unavoidable complications occasionally occur. If you have any problems or questions after discharge, call DR. Zadrian Mccauley, [phone].  ACTIVITY  You may resume your regular activity, but move at a slower pace for the next 24 hours.   Take frequent rest periods for the next 24 hours.   Walking will help get rid of the air and reduce the bloated feeling in your belly (abdomen).   No driving for 24 hours (because of the medicine (anesthesia) used during the test).   You may shower.   Do not sign any important legal documents or operate any machinery for 24 hours (because of the anesthesia used during the test).    NUTRITION  Drink plenty of fluids.   You may resume your normal diet as instructed by your doctor.   Begin with a light meal and progress to your normal diet. Heavy or fried foods are harder to digest and may make you feel sick to your stomach (nauseated).   Avoid alcoholic beverages for 24 hours or as instructed.    MEDICATIONS  You may resume your normal medications.   WHAT YOU CAN EXPECT TODAY  Some feelings of bloating in the abdomen.   Passage of more gas than usual.   Spotting of blood in your stool or on the toilet paper  .  IF YOU HAD POLYPS REMOVED DURING THE COLONOSCOPY:  Eat a soft diet IF YOU HAVE NAUSEA, BLOATING, ABDOMINAL PAIN, OR VOMITING.    FINDING OUT THE RESULTS OF YOUR TEST Not all test results are  available during your visit. DR. Oneida Alar WILL CALL YOU WITHIN 7 DAYS OF YOUR PROCEDUE WITH YOUR RESULTS. Do not assume everything is normal if you have not heard from DR. Gleen Ripberger IN ONE WEEK, CALL HER OFFICE AT [phone].  SEEK IMMEDIATE MEDICAL ATTENTION AND CALL THE OFFICE: [phone] IF:  You have more than a spotting of blood in your stool.   Your belly is swollen (abdominal distention).   You are nauseated or vomiting.   You have a temperature over 101F.   You have abdominal pain or discomfort that is severe or gets worse throughout the day.   High-Fiber Diet A high-fiber diet changes your normal diet to include more whole grains, legumes, fruits, and vegetables. Changes in the diet involve replacing refined carbohydrates with unrefined foods. The calorie level of the diet is essentially unchanged. The Dietary Reference Intake (recommended amount) for adult males is 38 grams per day. For adult females, it is 25 grams per day. Pregnant and lactating women should consume 28 grams of fiber per day. Fiber is the intact part of a plant that is not broken down during digestion. Functional fiber is fiber that has been isolated from the plant to provide a beneficial effect in the body. PURPOSE  Increase stool bulk.   Ease and regulate bowel movements.   Lower cholesterol.  INDICATIONS THAT YOU NEED MORE FIBER  Constipation and hemorrhoids.   Uncomplicated diverticulosis (  intestine condition) and irritable bowel syndrome.   Weight management.   As a protective measure against hardening of the arteries (atherosclerosis), diabetes, and cancer.   GUIDELINES FOR INCREASING FIBER IN THE DIET  Start adding fiber to the diet slowly. A gradual increase of about 5 more grams (2 slices of whole-wheat bread, 2 servings of most fruits or vegetables, or 1 bowl of high-fiber cereal) per day is best. Too rapid an increase in fiber may result in constipation, flatulence, and bloating.   Drink  enough water and fluids to keep your urine clear or pale yellow. Water, juice, or caffeine-free drinks are recommended. Not drinking enough fluid may cause constipation.   Eat a variety of high-fiber foods rather than one type of fiber.   Try to increase your intake of fiber through using high-fiber foods rather than fiber pills or supplements that contain small amounts of fiber.   The goal is to change the types of food eaten. Do not supplement your present diet with high-fiber foods, but replace foods in your present diet.  INCLUDE A VARIETY OF FIBER SOURCES  Replace refined and processed grains with whole grains, canned fruits with fresh fruits, and incorporate other fiber sources. White rice, white breads, and most bakery goods contain little or no fiber.   Brown whole-grain rice, buckwheat oats, and many fruits and vegetables are all good sources of fiber. These include: broccoli, Brussels sprouts, cabbage, cauliflower, beets, sweet potatoes, white potatoes (skin on), carrots, tomatoes, eggplant, squash, berries, fresh fruits, and dried fruits.   Cereals appear to be the richest source of fiber. Cereal fiber is found in whole grains and bran. Bran is the fiber-rich outer coat of cereal grain, which is largely removed in refining. In whole-grain cereals, the bran remains. In breakfast cereals, the largest amount of fiber is found in those with "bran" in their names. The fiber content is sometimes indicated on the label.   You may need to include additional fruits and vegetables each day.   In baking, for 1 cup white flour, you may use the following substitutions:   1 cup whole-wheat flour minus 2 tablespoons.   1/2 cup white flour plus 1/2 cup whole-wheat flour.   Polyps, Colon  A polyp is extra tissue that grows inside your body. Colon polyps grow in the large intestine. The large intestine, also called the colon, is part of your digestive system. It is a long, hollow tube at the end of  your digestive tract where your body makes and stores stool. Most polyps are not dangerous. They are benign. This means they are not cancerous. But over time, some types of polyps can turn into cancer. Polyps that are smaller than a pea are usually not harmful. But larger polyps could someday become or may already be cancerous. To be safe, doctors remove all polyps and test them.   WHO GETS POLYPS? Anyone can get polyps, but certain people are more likely than others. You may have a greater chance of getting polyps if:  You are over 50.   You have had polyps before.   Someone in your family has had polyps.   Someone in your family has had cancer of the large intestine.   Find out if someone in your family has had polyps. You may also be more likely to get polyps if you:   Eat a lot of fatty foods   Smoke   Drink alcohol   Do not exercise  Eat too much  TREATMENT  The caregiver will remove the polyp during sigmoidoscopy or colonoscopy.    PREVENTION There is not one sure way to prevent polyps. You might be able to lower your risk of getting them if you:  Eat more fruits and vegetables and less fatty food.   Do not smoke.   Avoid alcohol.   Exercise every day.   Lose weight if you are overweight.   Eating more calcium and folate can also lower your risk of getting polyps. Some foods that are rich in calcium are milk, cheese, and broccoli. Some foods that are rich in folate are chickpeas, kidney beans, and spinach.   Hemorrhoids Hemorrhoids are dilated (enlarged) veins around the rectum. Sometimes clots will form in the veins. This makes them swollen and painful. These are called thrombosed hemorrhoids. Causes of hemorrhoids include:  Constipation.   Straining to have a bowel movement.   HEAVY LIFTING HOME CARE INSTRUCTIONS  Eat a well balanced diet and drink 6 to 8 glasses of water every day to avoid constipation. You may also use a bulk laxative.   Avoid  straining to have bowel movements.   Keep anal area dry and clean.   Do not use a donut shaped pillow or sit on the toilet for long periods. This increases blood pooling and pain.   Move your bowels when your body has the urge; this will require less straining and will decrease pain and pressure.

## 2014-08-26 NOTE — H&P (Signed)
  Primary Care Physician:  Rosita Fire, MD Primary Gastroenterologist:  Dr. Oneida Alar  Pre-Procedure History & Physical: HPI:  Michelle Morrison is a 53 y.o. female here for COLON CANCER SCREENING.  Past Medical History  Diagnosis Date  . Hyperlipidemia   . Hypertension   . Diabetes mellitus without complication     Past Surgical History  Procedure Laterality Date  . Cesarean section    . Cholecystectomy      Prior to Admission medications   Medication Sig Start Date End Date Taking? Authorizing Provider  amLODipine (NORVASC) 10 MG tablet Take 10 mg by mouth daily.   Yes Historical Provider, MD  hydrochlorothiazide (HYDRODIURIL) 12.5 MG tablet Take 12.5 mg by mouth daily.   Yes Historical Provider, MD  lisinopril (PRINIVIL,ZESTRIL) 40 MG tablet Take 40 mg by mouth daily.   Yes Historical Provider, MD  metFORMIN (GLUCOPHAGE) 500 MG tablet Take 500 mg by mouth 2 (two) times daily with a meal.   Yes Historical Provider, MD  polyethylene glycol-electrolytes (TRILYTE) 420 G solution Take 4,000 mLs by mouth as directed. 08/02/14  Yes [name], MD  simvastatin (ZOCOR) 40 MG tablet Take 40 mg by mouth daily.   Yes Historical Provider, MD  lisinopril (PRINIVIL,ZESTRIL) 20 MG tablet Take 40 mg by mouth daily.     Historical Provider, MD  peg 3350 powder (MOVIPREP) 100 G SOLR Take 1 kit (200 g total) by mouth as directed. 07/27/14   [name], MD    Allergies as of 07/25/2014  . (No Known Allergies)    Family History  Problem Relation Age of Onset  . Hypertension Mother   . Diabetes Mother   . Colon cancer Neg Hx     History   Social History  . Marital Status: Single    Spouse Name: N/A    Number of Children: N/A  . Years of Education: N/A   Occupational History  . Not on file.   Social History Main Topics  . Smoking status: Never Smoker   . Smokeless tobacco: Not on file  . Alcohol Use: No  . Drug Use: No  . Sexual Activity: No   Other Topics Concern  . Not on  file   Social History Narrative  . No narrative on file    Review of Systems: See HPI, otherwise negative ROS   Physical Exam: BP 208/89  Pulse 85  Temp(Src) 98.2 F (36.8 C) (Oral)  Resp 16  Ht _0  (1.575 m)  Wt 172 lb (78.019 kg)  BMI 31.45 kg/m2  SpO2 100% General:   Alert,  pleasant and cooperative in NAD Head:  Normocephalic and atraumatic. Neck:  Supple; Lungs:  Clear throughout to auscultation.    Heart:  Regular rate and rhythm. Abdomen:  Soft, nontender and nondistended. Normal bowel sounds, without guarding, and without rebound.   Neurologic:  Alert and  oriented x4;  grossly normal neurologically.  Impression/Plan:     SCREENING  Plan:  1. TCS TODAY

## 2014-08-26 NOTE — OR Nursing (Signed)
Dr. Oneida Alar notified by Lurline Del RN of increased BP 208/89 in left arm and 215/97 in right arm. Orders received and carried out.

## 2014-08-27 NOTE — Op Note (Signed)
[facility] [address] [place], [address]   COLONOSCOPY PROCEDURE REPORT  PATIENT: Michelle Morrison, Michelle Morrison  MR#: [id number] BIRTHDATE: [birth year] , 63  yrs. old GENDER: female ENDOSCOPIST: Barney Drain, MD REFERRED YT:[id number] [name], M.D. PROCEDURE DATE:  08/26/2014 PROCEDURE:   Colonoscopy with snare polypectomy INDICATIONS:average risk for colon cancer. MEDICATIONS: Promethazine (Phenergan) 12.5 mg IV, Demerol 100 mg IV, and Versed 5 mg IV  DESCRIPTION OF PROCEDURE:    Physical exam was performed.  Informed consent was obtained from the patient after explaining the benefits, risks, and alternatives to procedure.  The patient was connected to monitor and placed in left lateral position. Continuous oxygen was provided by nasal cannula and IV medicine administered through an indwelling cannula.  After administration of sedation and rectal exam, the patients rectum was intubated and the EC-3890Li ([id number])  colonoscope was advanced under direct visualization to the ileum.  The scope was removed slowly by carefully examining the color, texture, anatomy, and integrity mucosa on the way out.  The patient was recovered in endoscopy and discharged home in satisfactory condition.    COLON FINDINGS: The examined terminal ileum appeared to be normal. , A sessile polyp measuring 6 mm in size was found in the sigmoid colon.  A polypectomy was performed using snare cautery.  The resection was complete, the polyp tissue was completely retrieved and sent to histology, The LEFT colon IS REdundant.  Manual abdominal counter-pressure was used to reach the cecum.  The patient was moved on to their back to reach the cecum, and Moderate sized internal hemorrhoids were found.  PREP QUALITY: good.  CECAL W/D TIME: 15 mins          COMPLICATIONS: None  ENDOSCOPIC IMPRESSION: 1.   ONE COLON POLYP REMOVED 2.   The LEFT colon IS redundant 3.   Moderate sized internal  hemorrhoids  RECOMMENDATIONS: FOLLOW A HIGH FIBER DIET.  AVOID ITEMS THAT CAUSE BLOATING & GAS. BIOPSY RESULTS SHOULD BE BACK IN 14 DAYS. Next colonoscopy in 5-10 years with an overtube.      _______________________________ eSigned:  Barney Drain, MD 09-15-2014 7:42 AM   CPT CODES: ICD CODES:  The ICD and CPT codes recommended by this software are interpretations from the data that the clinical staff has captured with the software.  The verification of the translation of this report to the ICD and CPT codes and modifiers is the sole responsibility of the health care institution and practicing physician where this report was generated.  [employer] will not be held responsible for the validity of the ICD and CPT codes included on this report.  AMA assumes no liability for data contained or not contained herein. CPT is a Designer, television/film set of the [employer].

## 2014-08-29 ENCOUNTER — Encounter (HOSPITAL_COMMUNITY): Payer: Self-pay | Admitting: Gastroenterology

## 2014-09-08 ENCOUNTER — Telehealth: Payer: Self-pay | Admitting: Gastroenterology

## 2014-09-08 NOTE — Telephone Encounter (Signed)
Pt had colonoscopy on 10/2 by SF and was checking on her results. Please call [phone]

## 2014-09-09 NOTE — Telephone Encounter (Signed)
Called and told pt I will call her when Dr. Oneida Alar signs off, probably by first of the week.

## 2014-09-12 NOTE — Telephone Encounter (Signed)
Please call pt. She had HYPERPLASTIC POLYPS removed from her colon. TCS in 10 years. High fiber diet.  

## 2014-09-12 NOTE — Telephone Encounter (Signed)
Reminder in epic °

## 2014-09-12 NOTE — Telephone Encounter (Signed)
LMOM to call.

## 2014-09-14 ENCOUNTER — Telehealth: Payer: Self-pay | Admitting: Gastroenterology

## 2014-09-14 NOTE — Telephone Encounter (Signed)
Pt wanted to know if her results were back. Please call [phone]

## 2014-09-16 ENCOUNTER — Other Ambulatory Visit (HOSPITAL_COMMUNITY): Payer: Self-pay | Admitting: Internal Medicine

## 2014-09-16 DIAGNOSIS — Z1231 Encounter for screening mammogram for malignant neoplasm of breast: Secondary | ICD-10-CM

## 2014-09-19 NOTE — Telephone Encounter (Signed)
Pt is aware of results. 

## 2014-09-19 NOTE — Telephone Encounter (Signed)
SEE TC OCT 15.

## 2014-09-20 DIAGNOSIS — E119 Type 2 diabetes mellitus without complications: Secondary | ICD-10-CM | POA: Diagnosis not present

## 2014-09-20 DIAGNOSIS — I10 Essential (primary) hypertension: Secondary | ICD-10-CM | POA: Diagnosis not present

## 2014-09-20 DIAGNOSIS — Z23 Encounter for immunization: Secondary | ICD-10-CM | POA: Diagnosis not present

## 2014-09-22 ENCOUNTER — Ambulatory Visit (HOSPITAL_COMMUNITY)
Admission: RE | Admit: 2014-09-22 | Discharge: 2014-09-22 | Disposition: A | Discharge status: Home / Self Care | Payer: Medicare Other | Source: Ambulatory Visit | Attending: Internal Medicine | Admitting: Internal Medicine

## 2014-09-22 DIAGNOSIS — Z1231 Encounter for screening mammogram for malignant neoplasm of breast: Secondary | ICD-10-CM

## 2014-09-26 ENCOUNTER — Encounter (HOSPITAL_COMMUNITY): Payer: Self-pay | Admitting: Gastroenterology

## 2014-09-27 ENCOUNTER — Ambulatory Visit: Payer: Medicare Other | Admitting: Obstetrics & Gynecology

## 2014-09-29 ENCOUNTER — Ambulatory Visit: Payer: Medicare Other | Admitting: Obstetrics & Gynecology

## 2014-10-04 ENCOUNTER — Ambulatory Visit (INDEPENDENT_AMBULATORY_CARE_PROVIDER_SITE_OTHER): Payer: Medicare Other | Admitting: Obstetrics & Gynecology

## 2014-10-04 ENCOUNTER — Encounter: Payer: Self-pay | Admitting: Obstetrics & Gynecology

## 2014-10-04 VITALS — BP 140/80 | Ht 61.0 in | Wt 174.0 lb

## 2014-10-04 DIAGNOSIS — R300 Dysuria: Secondary | ICD-10-CM

## 2014-10-04 LAB — POCT URINALYSIS DIPSTICK
Glucose, UA: NEGATIVE
KETONES UA: NEGATIVE
Leukocytes, UA: NEGATIVE
Nitrite, UA: NEGATIVE
PROTEIN UA: 1
RBC UA: NEGATIVE

## 2014-10-04 MED ORDER — CIPROFLOXACIN HCL 500 MG PO TABS: 500.0000 mg | ORAL_TABLET | Freq: Two times a day (BID) | ORAL | Status: DC

## 2014-10-04 NOTE — Progress Notes (Signed)
Patient ID: Michelle Morrison, female   DOB: Aug 24, 1961, 54 y.o.   MRN: [id number]   Chief Complaint  Patient presents with  . gyn visit    burn with urination x2 day.    Pt with burning and frequency urgency for the last 2 days Has history of UTI a few in the past Pain is crampy and moderate  UA is negative today  Blood pressure 140/80, height 5\' 1"  (1.549 m), weight 174 lb (78.926 kg). Gen WDWN female NAD  Cervicitis vs cystitis:  Culture pending:    Cipro 500 BID x 7 days which would cover both possible issues

## 2014-10-05 LAB — URINE CULTURE
COLONY COUNT: NO GROWTH
Organism ID, Bacteria: NO GROWTH

## 2014-10-28 DIAGNOSIS — E1165 Type 2 diabetes mellitus with hyperglycemia: Secondary | ICD-10-CM | POA: Diagnosis not present

## 2014-10-28 DIAGNOSIS — I10 Essential (primary) hypertension: Secondary | ICD-10-CM | POA: Diagnosis not present

## 2014-10-28 DIAGNOSIS — E785 Hyperlipidemia, unspecified: Secondary | ICD-10-CM | POA: Diagnosis not present

## 2014-10-28 DIAGNOSIS — E669 Obesity, unspecified: Secondary | ICD-10-CM | POA: Diagnosis not present

## 2014-11-08 DIAGNOSIS — E119 Type 2 diabetes mellitus without complications: Secondary | ICD-10-CM | POA: Diagnosis not present

## 2014-12-05 DIAGNOSIS — Z0000 Encounter for general adult medical examination without abnormal findings: Secondary | ICD-10-CM | POA: Diagnosis not present

## 2014-12-15 ENCOUNTER — Encounter: Payer: Self-pay | Admitting: Obstetrics & Gynecology

## 2014-12-15 ENCOUNTER — Ambulatory Visit (INDEPENDENT_AMBULATORY_CARE_PROVIDER_SITE_OTHER): Payer: Medicare Other | Admitting: Obstetrics & Gynecology

## 2014-12-15 VITALS — BP 150/100 | Wt 175.0 lb

## 2014-12-15 DIAGNOSIS — K611 Rectal abscess: Secondary | ICD-10-CM

## 2014-12-15 MED ORDER — POLYETHYLENE GLYCOL 3350 17 GM/SCOOP PO POWD: 1.0000 | Freq: Once | ORAL | Status: DC

## 2014-12-15 MED ORDER — SULFAMETHOXAZOLE-TRIMETHOPRIM 800-160 MG PO TABS: 1.0000 | ORAL_TABLET | Freq: Two times a day (BID) | ORAL | Status: DC

## 2014-12-15 NOTE — Progress Notes (Signed)
Patient ID: Michelle Morrison, female   DOB: 20-Mar-1961, 54 y.o.   MRN: [id number] Pt presents complaining of an area around her ectum that is tender, never had before No bleeding  Blood pressure 150/100, weight 175 lb (79.379 kg).   No burning with urination, frequency or urgency No nausea, vomiting or diarrhea Nor fever chills or other constitutional symptoms   Exam Perirectal area small infection, not abscess NEFG Vagina normal  Peri rectal infection  Plan Bactrim DS x 10 days

## 2015-01-05 ENCOUNTER — Ambulatory Visit: Payer: Medicare Other | Admitting: Obstetrics & Gynecology

## 2015-01-10 ENCOUNTER — Encounter: Payer: Self-pay | Admitting: Obstetrics & Gynecology

## 2015-01-10 ENCOUNTER — Ambulatory Visit (INDEPENDENT_AMBULATORY_CARE_PROVIDER_SITE_OTHER): Payer: Medicare Other | Admitting: Obstetrics & Gynecology

## 2015-01-10 VITALS — BP 178/80 | Ht 64.0 in | Wt 171.0 lb

## 2015-01-10 DIAGNOSIS — K611 Rectal abscess: Secondary | ICD-10-CM | POA: Diagnosis not present

## 2015-01-10 MED ORDER — METRONIDAZOLE 500 MG PO TABS: 500.0000 mg | ORAL_TABLET | Freq: Two times a day (BID) | ORAL | Status: DC

## 2015-01-10 MED ORDER — CIPROFLOXACIN HCL 500 MG PO TABS: 500.0000 mg | ORAL_TABLET | Freq: Two times a day (BID) | ORAL | Status: DC

## 2015-01-10 NOTE — Progress Notes (Signed)
Patient ID: Michelle Morrison, female   DOB: [birth year], 54 y.o.   MRN: [id number] Patient ID: Michelle Morrison, female   DOB: 1961-11-22, 54 y.o.   MRN: [id number] See previous note:  Pt presents complaining of an area around her ectum that is tender, never had before No bleeding  Blood pressure 178/80, height 5\' 4"  (1.626 m), weight 171 lb (77.565 kg).   No burning with urination, frequency or urgency No nausea, vomiting or diarrhea Nor fever chills or other constitutional symptoms   Exam Perirectal area small infection, not abscess NEFG Vagina normal  Peri rectal infection  Plan Bactrim DS x 10 days  Was doing well came off antibiotic and has gotten worse again, no drainage  Area is noted and definitely smaller and less tender, but still present Will change to cipro and flagyl and will have see dr Arnoldo Morale in 2 weeks

## 2015-01-16 DIAGNOSIS — I10 Essential (primary) hypertension: Secondary | ICD-10-CM | POA: Diagnosis not present

## 2015-01-16 DIAGNOSIS — E1165 Type 2 diabetes mellitus with hyperglycemia: Secondary | ICD-10-CM | POA: Diagnosis not present

## 2015-01-18 DIAGNOSIS — K029 Dental caries, unspecified: Secondary | ICD-10-CM | POA: Diagnosis not present

## 2015-01-26 ENCOUNTER — Ambulatory Visit (INDEPENDENT_AMBULATORY_CARE_PROVIDER_SITE_OTHER): Payer: Medicare Other | Admitting: Obstetrics & Gynecology

## 2015-01-26 ENCOUNTER — Encounter: Payer: Self-pay | Admitting: Obstetrics & Gynecology

## 2015-01-26 VITALS — BP 140/80 | HR 68 | Wt 168.0 lb

## 2015-01-26 DIAGNOSIS — K611 Rectal abscess: Secondary | ICD-10-CM

## 2015-01-26 DIAGNOSIS — K529 Noninfective gastroenteritis and colitis, unspecified: Secondary | ICD-10-CM | POA: Diagnosis not present

## 2015-01-26 DIAGNOSIS — K521 Toxic gastroenteritis and colitis: Secondary | ICD-10-CM

## 2015-01-26 DIAGNOSIS — T3695XA Adverse effect of unspecified systemic antibiotic, initial encounter: Secondary | ICD-10-CM

## 2015-01-26 NOTE — Progress Notes (Signed)
Patient ID: Michelle Morrison, female   DOB: 07/07/1961, 54 y.o.   MRN: [id number]   Chief Complaint  Patient presents with  . Follow-up     HPI:    Pt presents for re examination of peri rectal abscess.  She has had now 2 courses of antibiotics, Bactrim and most recently flagyl and ciprofloxacin New complaints: watery diarrhea for the last few days, several episodes per day Location:  rectum. Quality:  sharp. Severity:  intense. Timing:  Daily several episodes. Duration:  Last few days. Context:  With bowel movements. Modifying factors:   Signs/Symptoms:    Problem Pertinent ROS:       GU:  No burning with urination, frequency or urgency, no discharge GI:  No nausea, vomiting + diarrhea, no abdominal pain except with diarrhea Constitutional:  No fever chills or other constitutional symptoms, no flu like sx    Extended ROS:     Review of Systems   Respiratory: Negative for cough, hemoptysis, sputum production, shortness of breath, wheezing and stridor.   Cardiovascular: Negative for chest pain, palpitations, orthopnea, claudication, leg swelling and PND.   Musculoskeletal: Negative for myalgias, back pain, joint pain and falls.  Skin: Negative for itching and rash.  Neurological: Negative for dizziness, tingling, tremors, sensory change, speech change, focal weakness, seizures, loss of consciousness, weakness and headaches.    [place]:             Past Medical History  Diagnosis Date  . Hyperlipidemia   . Hypertension   . Diabetes mellitus without complication     Past Surgical History  Procedure Laterality Date  . Cesarean section    . Cholecystectomy    . Colonoscopy N/A 08/26/2014    Procedure: COLONOSCOPY;  Surgeon: [name], MD;  Location: [facility];  Service: Endoscopy;  Laterality: N/A;  9:30 AM - moved to 8:30 - Ginger to notify pt    OB History    Gravida Para Term Preterm AB TAB SAB Ectopic Multiple Living   2 2              No Known  Allergies  History   Social History  . Marital Status: Single    Spouse Name: N/A  . Number of Children: N/A  . Years of Education: N/A   Social History Main Topics  . Smoking status: Never Smoker   . Smokeless tobacco: Never Used  . Alcohol Use: No  . Drug Use: No  . Sexual Activity: No   Other Topics Concern  . None   Social History Narrative    Family History  Problem Relation Age of Onset  . Hypertension Mother   . Diabetes Mother   . Colon cancer Neg Hx      Examination:  Vitals:  Blood pressure 140/80, pulse 68, weight 168 lb (76.204 kg).    Physical Examination:     Rectum no masses or lesions non tender Perineum normal no masses non tender Vulva no masses non tender NEFG    Labs were ordered today:  C dificile toxin Imaging studies were not ordered today:    Lab tests were not reviewed today:    Imaging studies were not reviewed today:    Prescription Drug Management:  New Prescriptions: none Renewed Prescriptions:  none Current prescription changes:  none   Impression/Plan(Problem Based): 1.  Peri rectal abscess      (follow up of a pre-existing problem) : Additional workup is not needed:  If recurs  will have Dr Arnoldo Morale see patient for possible surgical intervention  2.  Antibiotic associated diarrhea      (new problem) : Additional workup is needed:  C dificile toxin will be obtained     Follow Up:   Will follow up after results of the C difficile test

## 2015-01-27 NOTE — Addendum Note (Signed)
Addended by: Florian Buff on: 01/27/2015 09:41 AM   Modules accepted: Level of Service

## 2015-01-30 DIAGNOSIS — R197 Diarrhea, unspecified: Secondary | ICD-10-CM | POA: Diagnosis not present

## 2015-01-30 DIAGNOSIS — K611 Rectal abscess: Secondary | ICD-10-CM | POA: Diagnosis not present

## 2015-01-30 DIAGNOSIS — K529 Noninfective gastroenteritis and colitis, unspecified: Secondary | ICD-10-CM | POA: Diagnosis not present

## 2015-02-02 LAB — CLOSTRIDIUM DIFFICILE EIA: C DIFFICILE TOXINS A+ B, EIA: NEGATIVE

## 2015-02-14 DIAGNOSIS — I10 Essential (primary) hypertension: Secondary | ICD-10-CM | POA: Diagnosis not present

## 2015-02-14 DIAGNOSIS — E1165 Type 2 diabetes mellitus with hyperglycemia: Secondary | ICD-10-CM | POA: Diagnosis not present

## 2015-03-25 DIAGNOSIS — N189 Chronic kidney disease, unspecified: Secondary | ICD-10-CM | POA: Diagnosis not present

## 2015-03-25 DIAGNOSIS — I10 Essential (primary) hypertension: Secondary | ICD-10-CM | POA: Diagnosis not present

## 2015-04-20 ENCOUNTER — Encounter: Payer: Self-pay | Admitting: Obstetrics & Gynecology

## 2015-04-20 ENCOUNTER — Other Ambulatory Visit (HOSPITAL_COMMUNITY)
Admission: RE | Admit: 2015-04-20 | Discharge: 2015-04-20 | Disposition: A | Discharge status: Home / Self Care | Payer: Medicare Other | Source: Ambulatory Visit | Attending: Obstetrics & Gynecology | Admitting: Obstetrics & Gynecology

## 2015-04-20 ENCOUNTER — Ambulatory Visit (INDEPENDENT_AMBULATORY_CARE_PROVIDER_SITE_OTHER): Payer: Medicare Other | Admitting: Obstetrics & Gynecology

## 2015-04-20 VITALS — BP 130/70 | HR 64 | Ht 61.0 in | Wt 170.0 lb

## 2015-04-20 DIAGNOSIS — Z124 Encounter for screening for malignant neoplasm of cervix: Secondary | ICD-10-CM | POA: Diagnosis not present

## 2015-04-20 DIAGNOSIS — Z01419 Encounter for gynecological examination (general) (routine) without abnormal findings: Secondary | ICD-10-CM | POA: Diagnosis not present

## 2015-04-20 DIAGNOSIS — Z1211 Encounter for screening for malignant neoplasm of colon: Secondary | ICD-10-CM

## 2015-04-20 DIAGNOSIS — Z1151 Encounter for screening for human papillomavirus (HPV): Secondary | ICD-10-CM | POA: Insufficient documentation

## 2015-04-20 DIAGNOSIS — R87810 Cervical high risk human papillomavirus (HPV) DNA test positive: Secondary | ICD-10-CM | POA: Diagnosis not present

## 2015-04-20 DIAGNOSIS — Z1212 Encounter for screening for malignant neoplasm of rectum: Secondary | ICD-10-CM | POA: Diagnosis not present

## 2015-04-20 NOTE — Progress Notes (Signed)
Patient ID: Michelle Morrison, female   DOB: [birth year], 54 y.o.   MRN: [id number] Subjective:     Michelle Morrison is a 54 y.o. female here for a routine exam.  No LMP recorded. Patient is postmenopausal. G2P2 Birth Control Method:  Is menopausal Menstrual Calendar(currently): postmenopausal  Current complaints: None.   Current acute medical issues:  Diabetes and hypertension   Recent Gynecologic History No LMP recorded. Patient is postmenopausal. Last Pap: 2015,  normal Last mammogram: 2016,  normal  Past Medical History  Diagnosis Date  . Hyperlipidemia   . Hypertension   . Diabetes mellitus without complication     Past Surgical History  Procedure Laterality Date  . Cesarean section    . Cholecystectomy    . Colonoscopy N/A 08/26/2014    Procedure: COLONOSCOPY;  Surgeon: [name], MD;  Location: [facility];  Service: Endoscopy;  Laterality: N/A;  9:30 AM - moved to 8:30 - Ginger to notify pt    OB History    Gravida Para Term Preterm AB TAB SAB Ectopic Multiple Living   2 2              History   Social History  . Marital Status: Single    Spouse Name: N/A  . Number of Children: N/A  . Years of Education: N/A   Social History Main Topics  . Smoking status: Never Smoker   . Smokeless tobacco: Never Used  . Alcohol Use: No  . Drug Use: No  . Sexual Activity: No   Other Topics Concern  . None   Social History Narrative    Family History  Problem Relation Age of Onset  . Hypertension Mother   . Diabetes Mother   . Colon cancer Neg Hx      Current outpatient prescriptions:  .  amLODipine (NORVASC) 10 MG tablet, Take 10 mg by mouth daily., Disp: , Rfl:  .  hydrochlorothiazide (HYDRODIURIL) 12.5 MG tablet, Take 12.5 mg by mouth daily., Disp: , Rfl:  .  lisinopril (PRINIVIL,ZESTRIL) 40 MG tablet, Take 40 mg by mouth daily., Disp: , Rfl:  .  metFORMIN (GLUCOPHAGE) 500 MG tablet, Take 500 mg by mouth 2 (two) times daily with a meal., Disp: , Rfl:  .   simvastatin (ZOCOR) 40 MG tablet, Take 40 mg by mouth daily., Disp: , Rfl:  .  ciprofloxacin (CIPRO) 500 MG tablet, Take 1 tablet (500 mg total) by mouth 2 (two) times daily. (Patient not taking: Reported on 12/15/2014), Disp: 14 tablet, Rfl: 0 .  ciprofloxacin (CIPRO) 500 MG tablet, Take 1 tablet (500 mg total) by mouth 2 (two) times daily. (Patient not taking: Reported on 01/26/2015), Disp: 20 tablet, Rfl: 0 .  lisinopril (PRINIVIL,ZESTRIL) 20 MG tablet, Take 40 mg by mouth daily. , Disp: , Rfl:  .  metroNIDAZOLE (FLAGYL) 500 MG tablet, Take 1 tablet (500 mg total) by mouth 2 (two) times daily. (Patient not taking: Reported on 01/26/2015), Disp: 20 tablet, Rfl: 0 .  peg 3350 powder (MOVIPREP) 100 G SOLR, Take 1 kit (200 g total) by mouth as directed. (Patient not taking: Reported on 04/20/2015), Disp: 1 kit, Rfl: 0 .  polyethylene glycol powder (GLYCOLAX/MIRALAX) powder, Take 255 g by mouth once. Take 1 scoop daily (Patient not taking: Reported on 01/26/2015), Disp: 255 g, Rfl: 0 .  sulfamethoxazole-trimethoprim (BACTRIM DS,SEPTRA DS) 800-160 MG per tablet, Take 1 tablet by mouth 2 (two) times daily. (Patient not taking: Reported on 04/20/2015), Disp: 20 tablet,  Rfl: 1  Review of Systems  Review of Systems  Constitutional: Negative for fever, chills, weight loss, malaise/fatigue and diaphoresis.  HENT: Negative for hearing loss, ear pain, nosebleeds, congestion, sore throat, neck pain, tinnitus and ear discharge.   Eyes: Negative for blurred vision, double vision, photophobia, pain, discharge and redness.  Respiratory: Negative for cough, hemoptysis, sputum production, shortness of breath, wheezing and stridor.   Cardiovascular: Negative for chest pain, palpitations, orthopnea, claudication, leg swelling and PND.  Gastrointestinal: negative for abdominal pain. Negative for heartburn, nausea, vomiting, diarrhea, constipation, blood in stool and melena.  Genitourinary: Negative for dysuria, urgency,  frequency, hematuria and flank pain.  Musculoskeletal: Negative for myalgias, back pain, joint pain and falls.  Skin: Negative for itching and rash.  Neurological: Negative for dizziness, tingling, tremors, sensory change, speech change, focal weakness, seizures, loss of consciousness, weakness and headaches.  Endo/Heme/Allergies: Negative for environmental allergies and polydipsia. Does not bruise/bleed easily.  Psychiatric/Behavioral: Negative for depression, suicidal ideas, hallucinations, memory loss and substance abuse. The patient is not nervous/anxious and does not have insomnia.        Objective:  Blood pressure 130/70, pulse 64, height $RemoveBe'5\' 1"'[id number]$  (1.549 m), weight 170 lb (77.111 kg).   Physical Exam  Vitals reviewed. Constitutional: She is oriented to person, place, and time. She appears well-developed and well-nourished.  HENT:  Head: Normocephalic and atraumatic.        Right Ear: External ear normal.  Left Ear: External ear normal.  Nose: Nose normal.  Mouth/Throat: Oropharynx is clear and moist.  Eyes: Conjunctivae and EOM are normal. Pupils are equal, round, and reactive to light. Right eye exhibits no discharge. Left eye exhibits no discharge. No scleral icterus.  Neck: Normal range of motion. Neck supple. No tracheal deviation present. No thyromegaly present.  Cardiovascular: Normal rate, regular rhythm, normal heart sounds and intact distal pulses.  Exam reveals no gallop and no friction rub.   No murmur heard. Respiratory: Effort normal and breath sounds normal. No respiratory distress. She has no wheezes. She has no rales. She exhibits no tenderness.  GI: Soft. Bowel sounds are normal. She exhibits no distension and no mass. There is no tenderness. There is no rebound and no guarding.  Genitourinary:  Breasts no masses skin changes or nipple changes bilaterally      Vulva is normal without lesions Vagina is pink moist without discharge Cervix normal in appearance and pap  is done  Uterus is normal size shape and contour Adnexa is negative with normal sized ovaries  {Rectal    hemoccult negative, normal tone, no masses  Musculoskeletal: Normal range of motion. She exhibits no edema and no tenderness.  Neurological: She is alert and oriented to person, place, and time. She has normal reflexes. She displays normal reflexes. No cranial nerve deficit. She exhibits normal muscle tone. Coordination normal.  Skin: Skin is warm and dry. No rash noted. No erythema. No pallor.  Psychiatric: She has a normal mood and affect. Her behavior is normal. Judgment and thought content normal.       Assessment:    Healthy female exam.    Plan:    Mammogram ordered. Follow up in: 1 year.

## 2015-04-20 NOTE — Addendum Note (Signed)
Addended by: Farley Ly on: 04/20/2015 11:41 AM   Modules accepted: Orders

## 2015-04-24 DIAGNOSIS — I10 Essential (primary) hypertension: Secondary | ICD-10-CM | POA: Diagnosis not present

## 2015-04-24 DIAGNOSIS — E1165 Type 2 diabetes mellitus with hyperglycemia: Secondary | ICD-10-CM | POA: Diagnosis not present

## 2015-04-26 LAB — CYTOLOGY - PAP

## 2015-05-02 ENCOUNTER — Telehealth: Payer: Self-pay | Admitting: *Deleted

## 2015-05-02 NOTE — Telephone Encounter (Signed)
-----   Message from Florian Buff, MD sent at 05/01/2015  2:46 PM EDT ----- Please schedule a colposcopy:  ASCUS + HPV  Thanks,  Dr Elonda Husky ----- Message -----    From: [facility] Interface    Sent: 04/28/2015   1:49 PM      To: Florian Buff, MD

## 2015-05-08 ENCOUNTER — Telehealth: Payer: Self-pay | Admitting: Obstetrics and Gynecology

## 2015-05-08 NOTE — Telephone Encounter (Signed)
-----   Message from Florian Buff, MD sent at 05/01/2015  2:46 PM EDT ----- Please schedule a colposcopy:  ASCUS + HPV  Thanks,  Dr Elonda Husky ----- Message -----    From: [facility] Interface    Sent: 04/28/2015   1:49 PM      To: Florian Buff, MD

## 2015-05-08 NOTE — Telephone Encounter (Signed)
Pt informed of abnormal pap (ASCUS and + HPV) from 04/20/2015. Pt informed of Dr. Chipper Oman' recommendation for Colposcopy and call was transferred to front staff to schedule appt. Pt was instructed in the colposcopy procedure and all questions answered.

## 2015-05-09 ENCOUNTER — Encounter: Payer: Self-pay | Admitting: Obstetrics & Gynecology

## 2015-05-09 ENCOUNTER — Ambulatory Visit (INDEPENDENT_AMBULATORY_CARE_PROVIDER_SITE_OTHER): Payer: Medicare Other | Admitting: Obstetrics & Gynecology

## 2015-05-09 VITALS — BP 170/70 | HR 74 | Wt 169.0 lb

## 2015-05-09 DIAGNOSIS — R87611 Atypical squamous cells cannot exclude high grade squamous intraepithelial lesion on cytologic smear of cervix (ASC-H): Secondary | ICD-10-CM | POA: Diagnosis not present

## 2015-05-09 DIAGNOSIS — M79672 Pain in left foot: Secondary | ICD-10-CM | POA: Diagnosis not present

## 2015-05-09 DIAGNOSIS — M7662 Achilles tendinitis, left leg: Secondary | ICD-10-CM | POA: Diagnosis not present

## 2015-05-09 DIAGNOSIS — IMO0002 Reserved for concepts with insufficient information to code with codable children: Secondary | ICD-10-CM

## 2015-05-09 NOTE — Progress Notes (Signed)
Patient ID: Michelle Morrison, female   DOB: [birth year], 54 y.o.   MRN: [id number]   Colposcopy Procedure Note  Indications: Pap smear 1 months ago showed: ASCUS with POSITIVE high risk HPV. The prior pap showed no abnormalities.  Prior cervical/vaginal disease: n/a. Prior cervical treatment: n/a.  Smoker:  Yes.   New sexual partner:  Yes.    : time frame:  Stared 9 months ago  History of abnormal Pap: no  Procedure Details  The risks and benefits of the procedure and Written informed consent obtained.  Speculum placed in vagina and excellent visualization of cervix achieved, cervix swabbed x 3 with acetic acid solution.  Findings: Cervix: no visible lesions, no mosaicism, no punctation and no abnormal vasculature; Marland Kitchen Vaginal inspection: normal without visible lesions. Vulvar colposcopy: vulvar colposcopy not performed.  Specimens: none  Complications: none.  Plan: Follow up pap smear 1 year

## 2015-05-23 ENCOUNTER — Encounter (HOSPITAL_COMMUNITY): Payer: Self-pay | Admitting: *Deleted

## 2015-05-23 ENCOUNTER — Emergency Department (HOSPITAL_COMMUNITY)
Admission: EM | Admit: 2015-05-23 | Discharge: 2015-05-23 | Disposition: A | Discharge status: Home / Self Care | Payer: No Typology Code available for payment source | Attending: Emergency Medicine | Admitting: Emergency Medicine

## 2015-05-23 ENCOUNTER — Emergency Department (HOSPITAL_COMMUNITY): Payer: No Typology Code available for payment source

## 2015-05-23 DIAGNOSIS — Z79899 Other long term (current) drug therapy: Secondary | ICD-10-CM | POA: Insufficient documentation

## 2015-05-23 DIAGNOSIS — M545 Low back pain: Secondary | ICD-10-CM | POA: Diagnosis not present

## 2015-05-23 DIAGNOSIS — Y92410 Unspecified street and highway as the place of occurrence of the external cause: Secondary | ICD-10-CM | POA: Insufficient documentation

## 2015-05-23 DIAGNOSIS — Y998 Other external cause status: Secondary | ICD-10-CM | POA: Diagnosis not present

## 2015-05-23 DIAGNOSIS — E785 Hyperlipidemia, unspecified: Secondary | ICD-10-CM | POA: Diagnosis not present

## 2015-05-23 DIAGNOSIS — E119 Type 2 diabetes mellitus without complications: Secondary | ICD-10-CM | POA: Diagnosis not present

## 2015-05-23 DIAGNOSIS — S233XXA Sprain of ligaments of thoracic spine, initial encounter: Secondary | ICD-10-CM | POA: Diagnosis not present

## 2015-05-23 DIAGNOSIS — S29012A Strain of muscle and tendon of back wall of thorax, initial encounter: Secondary | ICD-10-CM | POA: Diagnosis not present

## 2015-05-23 DIAGNOSIS — I10 Essential (primary) hypertension: Secondary | ICD-10-CM | POA: Diagnosis not present

## 2015-05-23 DIAGNOSIS — M549 Dorsalgia, unspecified: Secondary | ICD-10-CM | POA: Diagnosis not present

## 2015-05-23 DIAGNOSIS — S39012A Strain of muscle, fascia and tendon of lower back, initial encounter: Secondary | ICD-10-CM | POA: Insufficient documentation

## 2015-05-23 DIAGNOSIS — S3992XA Unspecified injury of lower back, initial encounter: Secondary | ICD-10-CM | POA: Diagnosis present

## 2015-05-23 DIAGNOSIS — S199XXA Unspecified injury of neck, initial encounter: Secondary | ICD-10-CM | POA: Diagnosis not present

## 2015-05-23 DIAGNOSIS — S239XXA Sprain of unspecified parts of thorax, initial encounter: Secondary | ICD-10-CM | POA: Diagnosis not present

## 2015-05-23 DIAGNOSIS — S299XXA Unspecified injury of thorax, initial encounter: Secondary | ICD-10-CM | POA: Diagnosis not present

## 2015-05-23 DIAGNOSIS — Y9389 Activity, other specified: Secondary | ICD-10-CM | POA: Insufficient documentation

## 2015-05-23 MED ORDER — IBUPROFEN 800 MG PO TABS
800.0000 mg | ORAL_TABLET | Freq: Once | ORAL | Status: AC
  Administered 2015-05-23: 800 mg via ORAL
  Filled 2015-05-23: qty 1

## 2015-05-23 MED ORDER — OXYCODONE-ACETAMINOPHEN 5-325 MG PO TABS
1.0000 | ORAL_TABLET | Freq: Once | ORAL | Status: AC
  Administered 2015-05-23: 1 via ORAL
  Filled 2015-05-23: qty 1
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
## 2015-05-23 NOTE — Discharge Instructions (Signed)
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
## 2015-05-25 DIAGNOSIS — E1165 Type 2 diabetes mellitus with hyperglycemia: Secondary | ICD-10-CM | POA: Diagnosis not present

## 2015-05-25 DIAGNOSIS — I10 Essential (primary) hypertension: Secondary | ICD-10-CM | POA: Diagnosis not present

## 2015-06-02 DIAGNOSIS — I10 Essential (primary) hypertension: Secondary | ICD-10-CM | POA: Diagnosis not present

## 2015-06-02 DIAGNOSIS — E1165 Type 2 diabetes mellitus with hyperglycemia: Secondary | ICD-10-CM | POA: Diagnosis not present

## 2015-06-27 ENCOUNTER — Ambulatory Visit (HOSPITAL_COMMUNITY): Payer: No Typology Code available for payment source | Attending: Internal Medicine | Admitting: Physical Therapy

## 2015-06-27 DIAGNOSIS — Z658 Other specified problems related to psychosocial circumstances: Secondary | ICD-10-CM | POA: Insufficient documentation

## 2015-06-27 DIAGNOSIS — Z7409 Other reduced mobility: Secondary | ICD-10-CM | POA: Insufficient documentation

## 2015-06-27 DIAGNOSIS — M545 Low back pain: Secondary | ICD-10-CM | POA: Insufficient documentation

## 2015-06-27 DIAGNOSIS — R29898 Other symptoms and signs involving the musculoskeletal system: Secondary | ICD-10-CM | POA: Insufficient documentation

## 2015-07-17 DIAGNOSIS — E669 Obesity, unspecified: Secondary | ICD-10-CM | POA: Diagnosis not present

## 2015-07-17 DIAGNOSIS — M549 Dorsalgia, unspecified: Secondary | ICD-10-CM | POA: Diagnosis not present

## 2015-07-17 DIAGNOSIS — E1165 Type 2 diabetes mellitus with hyperglycemia: Secondary | ICD-10-CM | POA: Diagnosis not present

## 2015-07-17 DIAGNOSIS — E785 Hyperlipidemia, unspecified: Secondary | ICD-10-CM | POA: Diagnosis not present

## 2015-07-17 DIAGNOSIS — I10 Essential (primary) hypertension: Secondary | ICD-10-CM | POA: Diagnosis not present

## 2015-07-20 ENCOUNTER — Ambulatory Visit (HOSPITAL_COMMUNITY): Payer: No Typology Code available for payment source | Admitting: Physical Therapy

## 2015-07-20 DIAGNOSIS — M545 Low back pain, unspecified: Secondary | ICD-10-CM

## 2015-07-20 DIAGNOSIS — Z7409 Other reduced mobility: Secondary | ICD-10-CM

## 2015-07-20 DIAGNOSIS — R6889 Other general symptoms and signs: Secondary | ICD-10-CM

## 2015-07-20 DIAGNOSIS — Z658 Other specified problems related to psychosocial circumstances: Secondary | ICD-10-CM | POA: Diagnosis not present

## 2015-07-20 DIAGNOSIS — R29898 Other symptoms and signs involving the musculoskeletal system: Secondary | ICD-10-CM

## 2015-07-20 DIAGNOSIS — Z789 Other specified health status: Secondary | ICD-10-CM
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
## 2015-07-20 NOTE — Patient Instructions (Signed)
HIP: Abduction - Side-Lying   Lie on side, legs straight and in line with trunk. Squeeze glutes. Raise top leg up and slightly back. Point toes forward. __10_ reps per set, _2__ sets per day, __5_ days per week Bend bottom leg to stabilize pelvis.  Copyright  VHI. All rights reserved.  Knee to Chest   Lying supine, bend involved knee to chest __10_ times, holding for 10 seconds. . Repeat with other leg. Do __2_ times per day.  Copyright  VHI. All rights reserved.  Lower Trunk Rotation Stretch   Keeping back flat and feet together, rotate knees to left side. Hold __3__ seconds. Repeat _10___ times per set. Do __1__ sets per session. Do __2__ sessions per day.  http://orth.exer.us/122   Copyright  VHI. All rights reserved.  Bridging   Slowly raise buttocks from floor, keeping stomach tight. Repeat 10 times per set, 1 set per session, 2 times per day.  http://orth.exer.us/1097   Copyright  VHI. All rights reserved.

## 2015-07-25 ENCOUNTER — Ambulatory Visit (HOSPITAL_COMMUNITY): Payer: No Typology Code available for payment source

## 2015-07-28 ENCOUNTER — Ambulatory Visit (HOSPITAL_COMMUNITY): Payer: No Typology Code available for payment source | Attending: Internal Medicine | Admitting: Physical Therapy

## 2015-08-02 ENCOUNTER — Ambulatory Visit (HOSPITAL_COMMUNITY): Payer: No Typology Code available for payment source | Admitting: Physical Therapy

## 2015-08-04 ENCOUNTER — Encounter (HOSPITAL_COMMUNITY): Payer: Medicare Other

## 2015-08-08 ENCOUNTER — Encounter (HOSPITAL_COMMUNITY): Payer: Medicare Other | Admitting: Physical Therapy

## 2015-08-10 ENCOUNTER — Encounter (HOSPITAL_COMMUNITY): Payer: Medicare Other | Admitting: Physical Therapy

## 2015-08-15 ENCOUNTER — Encounter (HOSPITAL_COMMUNITY): Payer: Medicare Other

## 2015-08-17 ENCOUNTER — Encounter (HOSPITAL_COMMUNITY): Payer: Medicare Other

## 2015-08-18 DIAGNOSIS — I10 Essential (primary) hypertension: Secondary | ICD-10-CM | POA: Diagnosis not present

## 2015-08-18 DIAGNOSIS — E785 Hyperlipidemia, unspecified: Secondary | ICD-10-CM | POA: Diagnosis not present

## 2015-08-18 DIAGNOSIS — E1165 Type 2 diabetes mellitus with hyperglycemia: Secondary | ICD-10-CM | POA: Diagnosis not present

## 2015-08-18 DIAGNOSIS — Z23 Encounter for immunization: Secondary | ICD-10-CM | POA: Diagnosis not present

## 2015-08-22 ENCOUNTER — Encounter (HOSPITAL_COMMUNITY): Payer: Medicare Other | Admitting: Physical Therapy

## 2015-08-22 DIAGNOSIS — M79672 Pain in left foot: Secondary | ICD-10-CM | POA: Diagnosis not present

## 2015-08-22 DIAGNOSIS — M7662 Achilles tendinitis, left leg: Secondary | ICD-10-CM | POA: Diagnosis not present

## 2015-08-28 ENCOUNTER — Other Ambulatory Visit (HOSPITAL_COMMUNITY): Payer: Self-pay | Admitting: Internal Medicine

## 2015-08-28 DIAGNOSIS — Z1231 Encounter for screening mammogram for malignant neoplasm of breast: Secondary | ICD-10-CM

## 2015-09-17 DIAGNOSIS — I10 Essential (primary) hypertension: Secondary | ICD-10-CM | POA: Diagnosis not present

## 2015-09-17 DIAGNOSIS — E1165 Type 2 diabetes mellitus with hyperglycemia: Secondary | ICD-10-CM | POA: Diagnosis not present

## 2015-09-22 ENCOUNTER — Encounter: Payer: Self-pay | Admitting: Obstetrics & Gynecology

## 2015-09-22 ENCOUNTER — Ambulatory Visit (INDEPENDENT_AMBULATORY_CARE_PROVIDER_SITE_OTHER): Payer: Medicare Other | Admitting: Obstetrics & Gynecology

## 2015-09-22 VITALS — BP 190/120 | HR 74 | Wt 166.0 lb

## 2015-09-22 DIAGNOSIS — Z1159 Encounter for screening for other viral diseases: Secondary | ICD-10-CM

## 2015-09-22 DIAGNOSIS — Z113 Encounter for screening for infections with a predominantly sexual mode of transmission: Secondary | ICD-10-CM

## 2015-09-22 DIAGNOSIS — Z118 Encounter for screening for other infectious and parasitic diseases: Secondary | ICD-10-CM | POA: Diagnosis not present

## 2015-09-23 NOTE — Progress Notes (Signed)
Patient ID: Michelle Morrison, female   DOB: 09/23/1961, 54 y.o.   MRN: [id number] Sexually Transmitted Disease Check Patient presents for sexually transmitted disease check. Sexual history reviewed with the patient. STD exposure: contact with individual with STD of unknown type 3 days ago.  Previous history of STD:  trichomonas. Current symptoms include none.  Contraception: post menopausal status.     Face to face time:  10 minutes  Greater than 50% of the visit time was spent in counseling and coordination of care with the patient.  The summary and outline of the counseling and care coordination is summarized in the note above.   All questions were answered.

## 2015-09-25 ENCOUNTER — Ambulatory Visit (HOSPITAL_COMMUNITY)
Admission: RE | Admit: 2015-09-25 | Discharge: 2015-09-25 | Disposition: A | Discharge status: Home / Self Care | Payer: Medicare Other | Source: Ambulatory Visit | Attending: Internal Medicine | Admitting: Internal Medicine

## 2015-09-25 ENCOUNTER — Other Ambulatory Visit: Payer: Medicare Other

## 2015-09-25 DIAGNOSIS — R300 Dysuria: Secondary | ICD-10-CM

## 2015-09-25 DIAGNOSIS — Z1231 Encounter for screening mammogram for malignant neoplasm of breast: Secondary | ICD-10-CM | POA: Diagnosis not present

## 2015-09-25 LAB — GC/CHLAMYDIA PROBE AMP
CHLAMYDIA, DNA PROBE: NEGATIVE
NEISSERIA GONORRHOEAE BY PCR: NEGATIVE

## 2015-09-26 LAB — MICROSCOPIC EXAMINATION
Epithelial Cells (non renal): >10 /hpf — AB (ref 0–10)
RBC, UA: >30 /hpf — AB (ref 0–?)
WBC, UA: >30 /hpf — AB (ref 0–?)

## 2015-09-26 LAB — URINALYSIS, ROUTINE W REFLEX MICROSCOPIC
Bilirubin, UA: NEGATIVE
GLUCOSE, UA: NEGATIVE
KETONES UA: NEGATIVE
Nitrite, UA: NEGATIVE
SPEC GRAV UA: 1.022 (ref 1.005–1.030)
Urobilinogen, Ur: 1 mg/dL (ref 0.2–1.0)
pH, UA: 6.5 (ref 5.0–7.5)

## 2015-09-27 LAB — URINE CULTURE

## 2015-09-28 ENCOUNTER — Telehealth: Payer: Self-pay | Admitting: Adult Health

## 2015-09-28 NOTE — Telephone Encounter (Signed)
Pt aware no growth but has blood in urine make F/U appt with Dr Elonda Husky

## 2015-09-29 ENCOUNTER — Encounter: Payer: Self-pay | Admitting: Obstetrics & Gynecology

## 2015-09-29 ENCOUNTER — Ambulatory Visit (INDEPENDENT_AMBULATORY_CARE_PROVIDER_SITE_OTHER): Payer: Medicare Other | Admitting: Obstetrics & Gynecology

## 2015-09-29 VITALS — BP 150/80 | HR 60 | Wt 166.0 lb

## 2015-09-29 DIAGNOSIS — A6000 Herpesviral infection of urogenital system, unspecified: Secondary | ICD-10-CM | POA: Diagnosis not present

## 2015-09-29 MED ORDER — ACYCLOVIR 400 MG PO TABS: 400.0000 mg | ORAL_TABLET | Freq: Every day | ORAL | Status: DC

## 2015-09-29 NOTE — Progress Notes (Signed)
Patient ID: Michelle Morrison, female   DOB: 16-May-1961, 54 y.o.   MRN: [id number] Chief Complaint  Patient presents with  . Follow-up    urine culture/ had blood in urine.c/c boil inner thigh.    Blood pressure 150/80, pulse 60, weight 166 lb (75.297 kg), last menstrual period 05/23/2007.  54 y.o. G2P2 Patient's last menstrual period was 05/23/2007. The current method of family planning is post menopausal status.  Subjective Pt with lesion on her right labia for a few days Tender and hurts when urinates  Objective Ulcerative weepy lesion right vulva, HSV culture done  Pertinent ROS No burning with urination, frequency or urgency No nausea, vomiting or diarrhea Nor fever chills or other constitutional symptoms   Labs or studies HSV culture    Impression Diagnoses this Encounter::   ICD-9-CM ICD-10-CM   1. Genital herpes 054.10 A60.00 Herpes simplex virus culture    Established relevant diagnosis(es):   Plan/Recommendations: Meds ordered this encounter  Medications  . acyclovir (ZOVIRAX) 400 MG tablet    Sig: Take 1 tablet (400 mg total) by mouth 5 (five) times daily.    Dispense:  50 tablet    Refill:  2    Labs or Scans Ordered: Orders Placed This Encounter  Procedures  . Herpes simplex virus culture      Follow up Return in about 2 weeks (around 10/13/2015) for Follow up, with Dr Elonda Husky.       All questions were answered.

## 2015-10-02 LAB — HERPES SIMPLEX VIRUS CULTURE

## 2015-10-04 ENCOUNTER — Ambulatory Visit (HOSPITAL_COMMUNITY): Payer: No Typology Code available for payment source | Attending: Internal Medicine | Admitting: Physical Therapy

## 2015-10-04 DIAGNOSIS — R262 Difficulty in walking, not elsewhere classified: Secondary | ICD-10-CM

## 2015-10-04 DIAGNOSIS — Z789 Other specified health status: Secondary | ICD-10-CM

## 2015-10-04 DIAGNOSIS — R29898 Other symptoms and signs involving the musculoskeletal system: Secondary | ICD-10-CM | POA: Insufficient documentation

## 2015-10-04 DIAGNOSIS — Z7409 Other reduced mobility: Secondary | ICD-10-CM | POA: Insufficient documentation

## 2015-10-04 DIAGNOSIS — M545 Low back pain, unspecified: Secondary | ICD-10-CM

## 2015-10-04 DIAGNOSIS — Z658 Other specified problems related to psychosocial circumstances: Secondary | ICD-10-CM | POA: Insufficient documentation

## 2015-10-04 DIAGNOSIS — R6889 Other general symptoms and signs: Secondary | ICD-10-CM
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
## 2015-10-10 ENCOUNTER — Ambulatory Visit (INDEPENDENT_AMBULATORY_CARE_PROVIDER_SITE_OTHER): Payer: Medicare Other | Admitting: Obstetrics & Gynecology

## 2015-10-10 ENCOUNTER — Ambulatory Visit (HOSPITAL_COMMUNITY): Payer: No Typology Code available for payment source

## 2015-10-10 ENCOUNTER — Encounter: Payer: Self-pay | Admitting: Obstetrics & Gynecology

## 2015-10-10 VITALS — BP 160/90 | HR 74 | Wt 168.0 lb

## 2015-10-10 DIAGNOSIS — M545 Low back pain, unspecified: Secondary | ICD-10-CM

## 2015-10-10 DIAGNOSIS — R29898 Other symptoms and signs involving the musculoskeletal system: Secondary | ICD-10-CM | POA: Diagnosis not present

## 2015-10-10 DIAGNOSIS — A6000 Herpesviral infection of urogenital system, unspecified: Secondary | ICD-10-CM | POA: Diagnosis not present

## 2015-10-10 DIAGNOSIS — R262 Difficulty in walking, not elsewhere classified: Secondary | ICD-10-CM

## 2015-10-10 DIAGNOSIS — Z789 Other specified health status: Secondary | ICD-10-CM

## 2015-10-10 DIAGNOSIS — Z658 Other specified problems related to psychosocial circumstances: Secondary | ICD-10-CM | POA: Diagnosis not present

## 2015-10-10 DIAGNOSIS — Z7409 Other reduced mobility: Secondary | ICD-10-CM

## 2015-10-10 DIAGNOSIS — R6889 Other general symptoms and signs: Secondary | ICD-10-CM

## 2015-10-10 NOTE — Therapy (Signed)
[place], [place], [address] Phone: [phone]   Fax:  [phone]  Physical Therapy Treatment  Patient Details  Name: Michelle Morrison MRN: [id number] Date of Birth: [birth year] Referring Provider: Legrand Rams  Encounter Date: 10/10/2015      PT End of Session - 10/10/15 0908    Visit Number 2   Number of Visits 8   Date for PT Re-Evaluation 11/03/15   Authorization Type Medicare   Authorization Time Period 10/04/15-12/04/15   Authorization - Visit Number 2   Authorization - Number of Visits 10   PT Start Time 0845   PT Stop Time 0923   PT Time Calculation (min) 38 min   Activity Tolerance Patient tolerated treatment well;No increased pain   Behavior During Therapy [facility] for tasks assessed/performed      Past Medical History  Diagnosis Date  . Hyperlipidemia   . Hypertension   . Diabetes mellitus without complication [facility])     Past Surgical History  Procedure Laterality Date  . Cesarean section    . Cholecystectomy    . Colonoscopy N/A 08/26/2014    Procedure: COLONOSCOPY;  Surgeon: [name], MD;  [facility];  Service: Endoscopy;  Laterality: N/A;  9:30 AM - moved to 8:30 - Ginger to notify pt    There were no vitals filed for this visit.  Visit Diagnosis:  Right-sided low back pain without sciatica  Weakness of both legs  Difficulty navigating stairs  Impaired functional mobility and activity tolerance  Difficulty walking      Subjective Assessment - 10/10/15 0847    Subjective Pt reports she is doing ok today, 8/10 pain, which is typical for her. She reports she has been working on [employer] and has no questions about it at this time. Denies feeling any worse after last session.    Pain Score 8    Pain Location Hip   Pain Orientation Right;Posterior                         [facility] Adult PT Treatment/Exercise - 10/10/15 0001    Exercises   Exercises Lumbar   Lumbar  Exercises: Stretches   Single Knee to Chest Stretch 3 reps;30 seconds  bilat; needs help c counting.    Double Knee to Chest Stretch Other (comment)  AA/ROM rocking 20x   Lower Trunk Rotation 4 reps;20 seconds  AA/ROM    Lumbar Exercises: Seated   Sit to Stand Other (comment)  3x10   Lumbar Exercises: Supine   Bridge Other (comment)  2x10   Lumbar Exercises: Prone   Other Prone Lumbar Exercises abduction heel slides AROM 2x10   Other Prone Lumbar Exercises Prone Hip Ext with knee at 90  1x10 bilat     Seated Marching  2x10 bilat           PT Education - 10/10/15 0907    Education provided Yes   Education Details reviewed goals and findings from exam   Person(s) Educated Patient   Methods Explanation   Comprehension Verbalized understanding          PT Short Term Goals - 10/04/15 1213    PT SHORT TERM GOAL #1   Title Pt will be independent with HEP.    Time 2   Period Weeks   Status New   PT SHORT TERM GOAL #2   Title Pt will complete five time sit to stand  in 35 seconds or less to demonstrate improved BLE strength.    Time 2   Period Weeks   Status New   PT SHORT TERM GOAL #3   Title Improve lumbar ROM by 10 degrees to normalize spinal motion and demonstrate decreased spasm in low back   Time 2   Period Weeks   Status New           PT Long Term Goals - 10/04/15 1213    PT LONG TERM GOAL #1   Title Pt will be independent in advanced HEP.    Time 4   Period Weeks   PT LONG TERM GOAL #2   Title Pt will complete five time sit to stand in 15 seconds to demonstrate improved BLE strength.    Time 4   Period Weeks   Status New   PT LONG TERM GOAL #3   Title Lumbar ROM will be full and painfree to allow for pt to complete functional tasks.    Time 4   Period Weeks   Status New   PT LONG TERM GOAL #4   Title Pt will ascend/descend 12 stairs while carrying 8 pounds with <2/10 pain to simulate carrying groceries upstairs to apartment.    Time 4    Period Weeks   Status New   PT LONG TERM GOAL #5   Title Pt will ambulate 450 feet during 3 minute walk test to demonstrate improved functional activity tolerance.    Time 4   Period Weeks   Status New               Plan - 10/10/15 0910    Clinical Impression Statement Pt responding well today to stretching activities with an initial reduction in soem pain. Movements remains fairly guarded, weak and painful with exercises bu tpatient is abel to complete al slowly and cautiously, with occasional help to assure stretches are meeting the times critera. Will continue to monitor progress and  add soem stretches to HEP if they remain benefitial.    Pt will benefit from skilled therapeutic intervention in order to improve on the following deficits Decreased activity tolerance;Decreased endurance;Decreased range of motion;Decreased strength;Difficulty walking;Postural dysfunction;Pain   Rehab Potential Fair   Clinical Impairments Affecting Rehab Potential Fair due to history of noncompliance   PT Frequency 2x / week   PT Duration 4 weeks   PT Treatment/Interventions ADLs/Self Care Home Management;Moist Heat;Gait training;Stair training;Functional mobility training;Therapeutic activities;Therapeutic exercise;Balance training;Neuromuscular re-education;Patient/family education;Manual techniques   PT Next Visit Plan start ab set progression next visit and review lasting effects of stretches. Add stretches ot HEP if applicable.    PT Home Exercise Plan No change this session.    Consulted and Agree with Plan of Care Patient        Problem List Patient Active Problem List   Diagnosis Date Noted  . HTN (hypertension), benign 04/01/2013    Buccola,Allan C 10/10/2015, 9:18 AM  9:18 AM  Etta Grandchild, PT, DPT [place] License # [id number]       [facility] [place], [place], [address] Phone: [phone]   Fax:  [phone]  Name:  Michelle Morrison MRN: [id number] Date of Birth: [birth year]

## 2015-10-10 NOTE — Progress Notes (Signed)
Patient ID: Michelle Morrison, female   DOB: [birth year], 54 y.o.   MRN: [id number] Chief Complaint  Patient presents with  . Follow-up    Blood pressure 160/90, pulse 74, weight 168 lb (76.204 kg), last menstrual period 05/23/2007.  54 y.o. G2P2 Patient's last menstrual period was 05/23/2007. The current method of family planning is post menopausal status.  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 Genital herpes 054.10 A60.00     Established relevant diagnosis(es):   Plan/Recommendations: No orders of the defined types were placed in this encounter.    Labs or Scans Ordered: No orders of the defined types were placed in this encounter.      Follow up     Face to face time:  10 minutes  Greater than 50% of the visit time was spent in counseling and coordination of care with the patient.  The summary and outline of the counseling and care coordination is summarized in the note above.   All questions were answered.

## 2015-10-12 ENCOUNTER — Ambulatory Visit (HOSPITAL_COMMUNITY): Payer: No Typology Code available for payment source | Admitting: Physical Therapy

## 2015-10-13 ENCOUNTER — Ambulatory Visit: Payer: Medicare Other | Admitting: Obstetrics & Gynecology

## 2015-10-16 ENCOUNTER — Ambulatory Visit (HOSPITAL_COMMUNITY): Payer: No Typology Code available for payment source | Admitting: Physical Therapy

## 2015-10-16 DIAGNOSIS — Z789 Other specified health status: Secondary | ICD-10-CM

## 2015-10-16 DIAGNOSIS — R29898 Other symptoms and signs involving the musculoskeletal system: Secondary | ICD-10-CM

## 2015-10-16 DIAGNOSIS — Z7409 Other reduced mobility: Secondary | ICD-10-CM

## 2015-10-16 DIAGNOSIS — R262 Difficulty in walking, not elsewhere classified: Secondary | ICD-10-CM | POA: Diagnosis not present

## 2015-10-16 DIAGNOSIS — Z658 Other specified problems related to psychosocial circumstances: Secondary | ICD-10-CM | POA: Diagnosis not present

## 2015-10-16 DIAGNOSIS — R6889 Other general symptoms and signs: Secondary | ICD-10-CM

## 2015-10-16 DIAGNOSIS — M545 Low back pain, unspecified: Secondary | ICD-10-CM

## 2015-10-16 NOTE — Therapy (Signed)
[place], [place], [address] Phone: [phone]   Fax:  [phone]  Physical Therapy Treatment  Patient Details  Name: Michelle Morrison MRN: [id number] Date of Birth: [birth year] Referring Provider: Legrand Rams  Encounter Date: 10/16/2015      PT End of Session - 10/16/15 0927    Visit Number 3   Number of Visits 8   Date for PT Re-Evaluation 11/03/15   Authorization Type Medicare   Authorization Time Period 10/04/15-12/04/15   Authorization - Visit Number 3   Authorization - Number of Visits 10   PT Start Time 0845   PT Stop Time 0931   PT Time Calculation (min) 46 min   Activity Tolerance Patient tolerated treatment well;No increased pain   Behavior During Therapy [facility] for tasks assessed/performed      Past Medical History  Diagnosis Date  . Hyperlipidemia   . Hypertension   . Diabetes mellitus without complication [facility])     Past Surgical History  Procedure Laterality Date  . Cesarean section    . Cholecystectomy    . Colonoscopy N/A 08/26/2014    Procedure: COLONOSCOPY;  Surgeon: [name], MD;  [facility];  Service: Endoscopy;  Laterality: N/A;  9:30 AM - moved to 8:30 - Ginger to notify pt    There were no vitals filed for this visit.  Visit Diagnosis:  No diagnosis found.      Subjective Assessment - 10/16/15 0934    Subjective Pt states she is having some pain rated at 6/10 today.  States she's been walking some and doing her exercises.    Currently in Pain? Yes   Pain Score 6    Pain Location Hip                         [facility] Adult PT Treatment/Exercise - 10/16/15 0855    Lumbar Exercises: Stretches   Active Hamstring Stretch 3 reps;20 seconds   Active Hamstring Stretch Limitations with rope in supine   Single Knee to Chest Stretch 3 reps;30 seconds   Lower Trunk Rotation 4 reps;20 seconds   Prone on Elbows Stretch Limitations   Prone on Elbows Stretch  Limitations 1 minute   Press Ups 5 reps   Piriformis Stretch 3 reps;30 seconds;Limitations   Piriformis Stretch Limitations seated bilateral   Lumbar Exercises: Aerobic   Stationary Bike nustep 8 minutes hills level 2 UE/LE    Lumbar Exercises: Supine   Ab Set 10 reps;5 seconds   Bridge 10 reps   Straight Leg Raise 10 reps   Lumbar Exercises: Prone   Straight Leg Raise 10 reps                  PT Short Term Goals - 10/04/15 1213    PT SHORT TERM GOAL #1   Title Pt will be independent with HEP.    Time 2   Period Weeks   Status New   PT SHORT TERM GOAL #2   Title Pt will complete five time sit to stand in 35 seconds or less to demonstrate improved BLE strength.    Time 2   Period Weeks   Status New   PT SHORT TERM GOAL #3   Title Improve lumbar ROM by 10 degrees to normalize spinal motion and demonstrate decreased spasm in low back   Time 2   Period Weeks   Status New  PT Long Term Goals - 10/04/15 1213    PT LONG TERM GOAL #1   Title Pt will be independent in advanced HEP.    Time 4   Period Weeks   PT LONG TERM GOAL #2   Title Pt will complete five time sit to stand in 15 seconds to demonstrate improved BLE strength.    Time 4   Period Weeks   Status New   PT LONG TERM GOAL #3   Title Lumbar ROM will be full and painfree to allow for pt to complete functional tasks.    Time 4   Period Weeks   Status New   PT LONG TERM GOAL #4   Title Pt will ascend/descend 12 stairs while carrying 8 pounds with <2/10 pain to simulate carrying groceries upstairs to apartment.    Time 4   Period Weeks   Status New   PT LONG TERM GOAL #5   Title Pt will ambulate 450 feet during 3 minute walk test to demonstrate improved functional activity tolerance.    Time 4   Period Weeks   Status New               Plan - 10/16/15 [id number]    Clinical Impression Statement Able to progress today with additional stabilization and strengthening exericses.  Pt  reported overall improvement following prone extension/press ups.  Added nustep at end of session to increase actvitiy tolerance and moibility.  Pt without complaints during therex, however required cues to complete in good form/stabilzation.     Clinical Impairments Affecting Rehab Potential Fair due to history of noncompliance   PT Next Visit Plan Continue to progress strength and stability.  Add theraband postural 3 activity next session.   PT Home Exercise Plan No change this session.    Consulted and Agree with Plan of Care Patient        Problem List Patient Active Problem List   Diagnosis Date Noted  . HTN (hypertension), benign 04/01/2013    Teena Irani, PTA/CLT [phone]  10/16/2015, 9:42 AM  [place] [address] [place], [place], [address] Phone: [phone]   Fax:  [phone]  Name: Michelle Morrison MRN: [id number] Date of Birth: Jan 18, 1961

## 2015-10-18 ENCOUNTER — Ambulatory Visit (HOSPITAL_COMMUNITY): Payer: No Typology Code available for payment source | Admitting: Physical Therapy

## 2015-10-20 ENCOUNTER — Encounter (HOSPITAL_COMMUNITY): Payer: Self-pay | Admitting: Emergency Medicine

## 2015-10-20 ENCOUNTER — Emergency Department (HOSPITAL_COMMUNITY)
Admission: EM | Admit: 2015-10-20 | Discharge: 2015-10-20 | Disposition: A | Discharge status: Home / Self Care | Payer: Medicare Other | Attending: Emergency Medicine | Admitting: Emergency Medicine

## 2015-10-20 DIAGNOSIS — E785 Hyperlipidemia, unspecified: Secondary | ICD-10-CM | POA: Diagnosis not present

## 2015-10-20 DIAGNOSIS — A6009 Herpesviral infection of other urogenital tract: Secondary | ICD-10-CM | POA: Insufficient documentation

## 2015-10-20 DIAGNOSIS — Z79899 Other long term (current) drug therapy: Secondary | ICD-10-CM | POA: Diagnosis not present

## 2015-10-20 DIAGNOSIS — Z7984 Long term (current) use of oral hypoglycemic drugs: Secondary | ICD-10-CM | POA: Insufficient documentation

## 2015-10-20 DIAGNOSIS — R102 Pelvic and perineal pain: Secondary | ICD-10-CM | POA: Diagnosis present

## 2015-10-20 DIAGNOSIS — E119 Type 2 diabetes mellitus without complications: Secondary | ICD-10-CM | POA: Diagnosis not present

## 2015-10-20 DIAGNOSIS — A6000 Herpesviral infection of urogenital system, unspecified: Secondary | ICD-10-CM

## 2015-10-20 DIAGNOSIS — I10 Essential (primary) hypertension: Secondary | ICD-10-CM | POA: Insufficient documentation

## 2015-10-20 HISTORY — DX: Herpesviral infection, unspecified: B00.9

## 2015-10-20 LAB — URINALYSIS, ROUTINE W REFLEX MICROSCOPIC
BILIRUBIN URINE: NEGATIVE
Glucose, UA: NEGATIVE mg/dL
KETONES UR: NEGATIVE mg/dL
LEUKOCYTES UA: NEGATIVE
NITRITE: NEGATIVE
PROTEIN: 30 mg/dL — AB
Specific Gravity, Urine: 1.025 (ref 1.005–1.030)
pH: 6 (ref 5.0–8.0)

## 2015-10-20 LAB — URINE MICROSCOPIC-ADD ON: WBC, UA: NONE SEEN WBC/hpf (ref 0–5)

## 2015-10-20 MED ORDER — IBUPROFEN 800 MG PO TABS
800.0000 mg | ORAL_TABLET | Freq: Once | ORAL | Status: AC
  Administered 2015-10-20: 800 mg via ORAL
  Filled 2015-10-20: qty 1

## 2015-10-20 MED ORDER — TRAMADOL HCL 50 MG PO TABS: 50.0000 mg | ORAL_TABLET | Freq: Four times a day (QID) | ORAL | Status: DC | PRN

## 2015-10-20 NOTE — ED Provider Notes (Signed)
CSN: [id number]     Arrival date & time 10/20/15  [id number] History   First MD Initiated Contact with Patient 10/20/15 0801     Chief Complaint  Patient presents with  . Vaginal Pain     (Consider location/radiation/quality/duration/timing/severity/associated sxs/prior Treatment) HPI   Michelle Morrison is a 54 y.o. female who presents to the Emergency Department complaining of persistent burning with urination for nearly one month. She states that she was seen by her GYN and was recently diagnosed with herpes and currently has a lesion on her left labia for which she is taking acyclovir. She comes to the ER for evaluation of her urine and recheck of the lesion. She states initially she was not taking her medicine correctly, but now taking it 5 times a day as prescribed. She denies abdominal pain, fever, back pain, rash or vaginal discharge.  Patient is hypertensive, but states that she just took her blood pressure medications prior to coming into the emergency department.   Past Medical History  Diagnosis Date  . Hyperlipidemia   . Hypertension   . Diabetes mellitus without complication ([place])   . Herpes simplex infection    Past Surgical History  Procedure Laterality Date  . Cesarean section    . Cholecystectomy    . Colonoscopy N/A 08/26/2014    Procedure: COLONOSCOPY;  Surgeon: [name], MD;  [facility];  Service: Endoscopy;  Laterality: N/A;  9:30 AM - moved to 8:30 - Ginger to notify pt   Family History  Problem Relation Age of Onset  . Hypertension Mother   . Diabetes Mother   . Colon cancer Neg Hx    Social History  Substance Use Topics  . Smoking status: Never Smoker   . Smokeless tobacco: Never Used  . Alcohol Use: No   OB History    Gravida Para Term Preterm AB TAB SAB Ectopic Multiple Living   2 2             Review of Systems  Constitutional: Negative for fever, chills, activity change and appetite change.  Respiratory: Negative for chest  tightness and shortness of breath.   Cardiovascular: Negative for chest pain.  Gastrointestinal: Negative for nausea, vomiting and abdominal pain.  Genitourinary: Positive for genital sores and vaginal pain (sharp burning pain to left labia). Negative for frequency, vaginal bleeding, vaginal discharge and difficulty urinating.  Musculoskeletal: Negative for back pain.  Skin: Negative for rash.  Neurological: Negative for dizziness and headaches.      Allergies  Review of patient's allergies indicates no known allergies.  Home Medications   Prior to Admission medications   Medication Sig Start Date End Date Taking? Authorizing Provider  acyclovir (ZOVIRAX) 400 MG tablet Take 1 tablet (400 mg total) by mouth 5 (five) times daily. 09/29/15   Florian Buff, MD  amLODipine (NORVASC) 10 MG tablet Take 10 mg by mouth daily.    Historical Provider, MD  hydrochlorothiazide (MICROZIDE) 12.5 MG capsule Take 12.5 mg by mouth daily.    Historical Provider, MD  lisinopril (PRINIVIL,ZESTRIL) 40 MG tablet Take 40 mg by mouth daily.    Historical Provider, MD  metFORMIN (GLUCOPHAGE) 500 MG tablet Take 500 mg by mouth 2 (two) times daily with a meal.    Historical Provider, MD  simvastatin (ZOCOR) 40 MG tablet Take 40 mg by mouth daily.    Historical Provider, MD   BP 224/104 mmHg  Pulse 66  Temp(Src) 97.6 F (36.4 C) (Oral)  Resp 16  Ht 5\' 1"  (1.549 m)  Wt 75.297 kg  BMI 31.38 kg/m2  SpO2 100%  LMP 05/23/2007 Physical Exam  Constitutional: She is oriented to person, place, and time. She appears well-developed and well-nourished. No distress.  HENT:  Head: Normocephalic.  Mouth/Throat: Oropharynx is clear and moist.  Neck: Normal range of motion. Neck supple.  Cardiovascular: Normal rate, regular rhythm and normal heart sounds.   No murmur heard. Pulmonary/Chest: Effort normal and breath sounds normal. No respiratory distress.  Abdominal: Soft. She exhibits no distension. There is no  tenderness. There is no rebound.  Genitourinary:  Small open lesion to the left labia majora. No erythema, drainage or edema  Musculoskeletal: Normal range of motion.  Neurological: She is alert and oriented to person, place, and time. She exhibits normal muscle tone. Coordination normal.  Skin: Skin is warm and dry.  Psychiatric: She has a normal mood and affect.  Nursing note and vitals reviewed.   ED Course  Procedures (including critical care time) Labs Review Labs Reviewed  URINALYSIS, ROUTINE W REFLEX MICROSCOPIC (NOT AT [facility]) - Abnormal; Notable for the following:    Hgb urine dipstick TRACE (*)    Protein, ur 30 (*)    All other components within normal limits  URINE MICROSCOPIC-ADD ON - Abnormal; Notable for the following:    Squamous Epithelial / LPF 0-5 (*)    Bacteria, UA FEW (*)    All other components within normal limits    I have personally reviewed and evaluated these images and lab results as part of my medical decision-making.     Reviewed pt's herpes culture report from 09/29/15.  Positive for HSV-2   MDM   Final diagnoses:  Herpes genitalis  Essential hypertension   Pt is well appearing, vitals stable.  BP improved.  Pt had taken her BP meds just prior to arrival.  U/A wnml, dysuria sx likely related to pain associated with the vaginal lesion.  Pt was advised to take her medications daily as directed.  She agrees to plan and appears stable for d/c     Kem Parkinson, PA-C 10/22/15 2234  Milton Ferguson, MD 10/31/15 2316

## 2015-10-20 NOTE — ED Notes (Signed)
Pt states that she has had burning with urination for over a month.  Has herpes lesion on left labia and is currently being treated for that.  Misunderstood the instructions on the medication and was not taking enough.  Took bp meds just before walking into ER.

## 2015-10-20 NOTE — Discharge Instructions (Signed)

## 2015-10-25 ENCOUNTER — Ambulatory Visit (HOSPITAL_COMMUNITY): Payer: No Typology Code available for payment source | Admitting: Physical Therapy

## 2015-10-25 DIAGNOSIS — R6889 Other general symptoms and signs: Secondary | ICD-10-CM

## 2015-10-25 DIAGNOSIS — M545 Low back pain, unspecified: Secondary | ICD-10-CM

## 2015-10-25 DIAGNOSIS — Z7409 Other reduced mobility: Secondary | ICD-10-CM | POA: Diagnosis not present

## 2015-10-25 DIAGNOSIS — R262 Difficulty in walking, not elsewhere classified: Secondary | ICD-10-CM | POA: Diagnosis not present

## 2015-10-25 DIAGNOSIS — R29898 Other symptoms and signs involving the musculoskeletal system: Secondary | ICD-10-CM

## 2015-10-25 DIAGNOSIS — Z789 Other specified health status: Secondary | ICD-10-CM

## 2015-10-25 DIAGNOSIS — Z658 Other specified problems related to psychosocial circumstances: Secondary | ICD-10-CM | POA: Diagnosis not present

## 2015-10-25 NOTE — Therapy (Signed)
[place], [place], [address] Phone: [phone]   Fax:  [phone]  Physical Therapy Treatment  Patient Details  Name: Michelle Morrison MRN: [id number] Date of Birth: [birth year] Referring Provider: Legrand Rams  Encounter Date: 10/25/2015      PT End of Session - 10/25/15 1059    Visit Number 4   Number of Visits 8   Date for PT Re-Evaluation 11/03/15   Authorization Type Medicare   Authorization Time Period 10/04/15-12/04/15   Authorization - Visit Number 4   Authorization - Number of Visits 10   PT Start Time 1022   PT Stop Time 1100   PT Time Calculation (min) 38 min   Activity Tolerance Patient tolerated treatment well;No increased pain   Behavior During Therapy [facility] for tasks assessed/performed      Past Medical History  Diagnosis Date  . Hyperlipidemia   . Hypertension   . Diabetes mellitus without complication ([place])   . Herpes simplex infection     Past Surgical History  Procedure Laterality Date  . Cesarean section    . Cholecystectomy    . Colonoscopy N/A 08/26/2014    Procedure: COLONOSCOPY;  Surgeon: [name], MD;  [facility];  Service: Endoscopy;  Laterality: N/A;  9:30 AM - moved to 8:30 - Ginger to notify pt    There were no vitals filed for this visit.  Visit Diagnosis:  Right-sided low back pain without sciatica  Weakness of both legs  Difficulty navigating stairs  Impaired functional mobility and activity tolerance  Difficulty walking      Subjective Assessment - 10/25/15 1047    Subjective Pt states she is having 6/10 pain today.  compliance reported with therex and walking 1 mile every other day.    Currently in Pain? Yes   Pain Score 6    Pain Location Hip   Pain Orientation Right   Pain Descriptors / Indicators Aching;Throbbing                         [facility] Adult PT Treatment/Exercise - 10/25/15 1024    Lumbar Exercises: Stretches   Active Hamstring Stretch 3 reps;20 seconds   Active Hamstring Stretch Limitations with rope in supine   Single Knee to Chest Stretch 3 reps;30 seconds   Lower Trunk Rotation 4 reps;20 seconds   Prone on Elbows Stretch Limitations   Prone on Elbows Stretch Limitations 1 minute   Press Ups 5 reps   Lumbar Exercises: Standing   Scapular Retraction Both;10 reps   Theraband Level (Scapular Retraction) Level 3 (Green)   Row Both;10 reps   Theraband Level (Row) Level 3 (Green)   Shoulder Extension Both;10 reps   Theraband Level (Shoulder Extension) Level 3 (Green)   Lumbar Exercises: Seated   Sit to Stand 10 reps   Sit to Stand Limitations no UE's   Lumbar Exercises: Supine   Bridge 15 reps   Straight Leg Raise 15 reps   Lumbar Exercises: Sidelying   Hip Abduction 10 reps   Lumbar Exercises: Prone   Straight Leg Raise 15 reps                  PT Short Term Goals - 10/04/15 1213    PT SHORT TERM GOAL #1   Title Pt will be independent with HEP.    Time 2   Period Weeks   Status New   PT SHORT  TERM GOAL #2   Title Pt will complete five time sit to stand in 35 seconds or less to demonstrate improved BLE strength.    Time 2   Period Weeks   Status New   PT SHORT TERM GOAL #3   Title Improve lumbar ROM by 10 degrees to normalize spinal motion and demonstrate decreased spasm in low back   Time 2   Period Weeks   Status New           PT Long Term Goals - 10/04/15 1213    PT LONG TERM GOAL #1   Title Pt will be independent in advanced HEP.    Time 4   Period Weeks   PT LONG TERM GOAL #2   Title Pt will complete five time sit to stand in 15 seconds to demonstrate improved BLE strength.    Time 4   Period Weeks   Status New   PT LONG TERM GOAL #3   Title Lumbar ROM will be full and painfree to allow for pt to complete functional tasks.    Time 4   Period Weeks   Status New   PT LONG TERM GOAL #4   Title Pt will ascend/descend 12 stairs while carrying 8 pounds  with <2/10 pain to simulate carrying groceries upstairs to apartment.    Time 4   Period Weeks   Status New   PT LONG TERM GOAL #5   Title Pt will ambulate 450 feet during 3 minute walk test to demonstrate improved functional activity tolerance.    Time 4   Period Weeks   Status New               Plan - 10/25/15 1059    Clinical Impression Statement IMproving form with decreased need for cues today.  Progressed reps and added postural 3 theraband exercises.  Also added sidelying hip abduction with noted weakness in hip abductors.  Pt declined nustep at end of session today.  Pt without increased pain or complaints with any actvitieis today.  REady to progress to standing functinoal strengthening.    Clinical Impairments Affecting Rehab Potential Fair due to history of noncompliance   PT Next Visit Plan Continue to progress strength and stability.  Progress standing functional strengthening   PT Home Exercise Plan No change this session.    Consulted and Agree with Plan of Care Patient        Problem List Patient Active Problem List   Diagnosis Date Noted  . HTN (hypertension), benign 04/01/2013    Teena Irani, PTA/CLT [phone]  10/25/2015, 11:02 AM  [facility] [address] [place], [place], [address] Phone: [phone]   Fax:  [phone]  Name: Michelle Morrison MRN: [id number] Date of Birth: 09-02-1961

## 2015-10-26 DIAGNOSIS — E1165 Type 2 diabetes mellitus with hyperglycemia: Secondary | ICD-10-CM | POA: Diagnosis not present

## 2015-10-26 DIAGNOSIS — L02511 Cutaneous abscess of right hand: Secondary | ICD-10-CM | POA: Diagnosis not present

## 2015-10-27 ENCOUNTER — Ambulatory Visit (HOSPITAL_COMMUNITY): Payer: No Typology Code available for payment source | Admitting: Physical Therapy

## 2015-11-01 ENCOUNTER — Ambulatory Visit (HOSPITAL_COMMUNITY): Payer: No Typology Code available for payment source | Admitting: Physical Therapy

## 2015-11-03 ENCOUNTER — Ambulatory Visit (HOSPITAL_COMMUNITY): Payer: No Typology Code available for payment source | Admitting: Physical Therapy

## 2015-11-06 ENCOUNTER — Ambulatory Visit (HOSPITAL_COMMUNITY): Payer: No Typology Code available for payment source | Attending: Internal Medicine | Admitting: Physical Therapy

## 2015-11-06 DIAGNOSIS — M545 Low back pain, unspecified: Secondary | ICD-10-CM

## 2015-11-06 DIAGNOSIS — Z658 Other specified problems related to psychosocial circumstances: Secondary | ICD-10-CM | POA: Diagnosis not present

## 2015-11-06 DIAGNOSIS — Z789 Other specified health status: Secondary | ICD-10-CM

## 2015-11-06 DIAGNOSIS — R262 Difficulty in walking, not elsewhere classified: Secondary | ICD-10-CM | POA: Diagnosis not present

## 2015-11-06 DIAGNOSIS — R29898 Other symptoms and signs involving the musculoskeletal system: Secondary | ICD-10-CM | POA: Insufficient documentation

## 2015-11-06 DIAGNOSIS — R6889 Other general symptoms and signs: Secondary | ICD-10-CM

## 2015-11-06 DIAGNOSIS — Z7409 Other reduced mobility: Secondary | ICD-10-CM | POA: Diagnosis not present

## 2015-11-06 NOTE — Therapy (Signed)
[place], [place], [address] Phone: [phone]   Fax:  [phone]  Physical Therapy Treatment  Patient Details  Name: Michelle Morrison MRN: [id number] Date of Birth: 1961/06/15 Referring Provider: Legrand Rams  Encounter Date: 11/06/2015      PT End of Session - 11/06/15 1217    Visit Number 5   Number of Visits 8   Authorization Type Medicare   Authorization Time Period 10/04/15-12/04/15   Authorization - Visit Number 5   Authorization - Number of Visits 10   PT Start Time 0856  pt 9 minutes late for treatment   PT Stop Time 0929   PT Time Calculation (min) 33 min   Activity Tolerance Patient tolerated treatment well   Behavior During Therapy [facility] for tasks assessed/performed      Past Medical History  Diagnosis Date  . Hyperlipidemia   . Hypertension   . Diabetes mellitus without complication ([place])   . Herpes simplex infection     Past Surgical History  Procedure Laterality Date  . Cesarean section    . Cholecystectomy    . Colonoscopy N/A 08/26/2014    Procedure: COLONOSCOPY;  Surgeon: [name], MD;  [facility];  Service: Endoscopy;  Laterality: N/A;  9:30 AM - moved to 8:30 - Ginger to notify pt    There were no vitals filed for this visit.  Visit Diagnosis:  Right-sided low back pain without sciatica  Weakness of both legs  Difficulty navigating stairs  Impaired functional mobility and activity tolerance  Difficulty walking      Subjective Assessment - 11/06/15 0900    Subjective Pt reports that she has been feeling pretty good. She has noticed improvements in her walking, and has had decreased pain levels. She reports that she still has some pain when the weather gets cold.    How long can you sit comfortably? 2-3 hours   How long can you stand comfortably? 2 hours   How long can you walk comfortably? no limitations   Currently in Pain? Yes   Pain Score 6    Pain  Location Hip   Pain Orientation Right            [facility] PT Assessment - 11/06/15 0001    Observation/Other Assessments   Focus on Therapeutic Outcomes (FOTO)  28% limited   AROM   Lumbar Flexion 86   Lumbar Extension 26   Lumbar - Right Side Bend 15   Lumbar - Left Side Bend 25   PROM   Right Hip External Rotation  28   Right Hip Internal Rotation  38   Left Hip External Rotation  32   Left Hip Internal Rotation  28   Strength   Right Hip Flexion 4-/5   Right Hip Extension 4-/5   Right Hip ABduction 4-/5   Left Hip Flexion 4-/5   Left Hip Extension 4-/5   Left Hip ABduction 4-/5   Right Knee Flexion 4/5   Right Knee Extension 4/5   Left Knee Flexion 4/5   Left Knee Extension 4/5   Flexibility   Hamstrings 56 degrees LLE, 52 degrees RLE   Transfers   Five time sit to stand comments  18.32"   6 minute walk test results    Aerobic Endurance Distance Walked 631   Endurance additional comments 3 minute walk test  [facility] Adult PT Treatment/Exercise - 11-29-15 0001    Lumbar Exercises: Stretches   Active Hamstring Stretch 2 reps;30 seconds   Active Hamstring Stretch Limitations 12" step                PT Education - 11-29-2015 1217    Education provided Yes   Education Details reviewed goals and HEP   Person(s) Educated Patient   Methods Explanation   Comprehension Verbalized understanding          PT Short Term Goals - November 29, 2015 1220    PT SHORT TERM GOAL #1   Title Pt will be independent with HEP.    Time 2   Period Weeks   Status Achieved   PT SHORT TERM GOAL #2   Title Pt will complete five time sit to stand in 35 seconds or less to demonstrate improved BLE strength.    Time 2   Period Weeks   Status Achieved   PT SHORT TERM GOAL #3   Title Improve lumbar ROM by 10 degrees to normalize spinal motion and demonstrate decreased spasm in low back   Time 2   Period Weeks   Status Achieved           PT Long  Term Goals - 2015/11/29 1220    PT LONG TERM GOAL #1   Title Pt will be independent in advanced HEP.    Time 4   Period Weeks   Status Achieved   PT LONG TERM GOAL #2   Title Pt will complete five time sit to stand in 15 seconds to demonstrate improved BLE strength.    Time 4   Period Weeks   Status Achieved   PT LONG TERM GOAL #3   Title Lumbar ROM will be full and painfree to allow for pt to complete functional tasks.    Time 4   Period Weeks   Status Achieved   PT LONG TERM GOAL #4   Title Pt will ascend/descend 12 stairs while carrying 8 pounds with <2/10 pain to simulate carrying groceries upstairs to apartment.    Time 4   Period Weeks   Status Achieved   PT LONG TERM GOAL #5   Title Pt will ambulate 450 feet during 3 minute walk test to demonstrate improved functional activity tolerance.    Time 4   Period Weeks   Status Achieved               Plan - 2015/11/29 1218    Clinical Impression Statement Pt late for treatment session today. Reassessment was completed in today's session. Pt demonstrates improvements in lumbar ROM, hip ROM, BLE strength, functional mobility, and functional activity tolerance. She has met all LTGs, and reports that she is compliant with her HEP at home. Pt is being d/c at this time to continue independently with HEP.    PT Next Visit Plan Pt d/c to HEP          G-Codes - 11/29/15 1222    Functional Assessment Tool Used FOTO   Functional Limitation Mobility: Walking and moving around   Mobility: Walking and Moving Around Goal Status [phone]) At least 20 percent but less than 40 percent impaired, limited or restricted   Mobility: Walking and Moving Around Discharge Status [phone]) At least 20 percent but less than 40 percent impaired, limited or restricted      Problem List Patient Active Problem List   Diagnosis Date Noted  . HTN (hypertension), benign 04/01/2013  PHYSICAL THERAPY DISCHARGE SUMMARY  Visits from Start of Care:  5  Current functional level related to goals / functional outcomes: Pt demonstrates improvements in BLE strength, hip and lumbar ROM, functional mobility, and functional activity tolerance.   Remaining deficits: None at this time   Education / Equipment: HEP  Plan: Patient agrees to discharge.  Patient goals were met. Patient is being discharged due to meeting the stated rehab goals.  ?????        Hilma Favors, PT, DPT [phone] 11/06/2015, 12:23 PM  [place] [address] [place], [place], [address] Phone: [phone]   Fax:  [phone]  Name: Michelle Morrison MRN: [id number] Date of Birth: Jul 13, 1961

## 2015-11-08 ENCOUNTER — Encounter (HOSPITAL_COMMUNITY): Payer: Medicare Other | Admitting: Physical Therapy

## 2015-11-09 ENCOUNTER — Encounter (HOSPITAL_COMMUNITY): Payer: Medicare Other | Admitting: Physical Therapy

## 2015-11-10 ENCOUNTER — Ambulatory Visit (HOSPITAL_COMMUNITY): Payer: No Typology Code available for payment source

## 2015-11-15 ENCOUNTER — Encounter (HOSPITAL_COMMUNITY): Payer: Medicare Other | Admitting: Physical Therapy

## 2015-11-21 ENCOUNTER — Telehealth: Payer: Self-pay | Admitting: *Deleted

## 2015-11-21 ENCOUNTER — Other Ambulatory Visit: Payer: Self-pay | Admitting: Obstetrics & Gynecology

## 2015-11-21 DIAGNOSIS — B009 Herpesviral infection, unspecified: Secondary | ICD-10-CM | POA: Insufficient documentation

## 2015-11-21 NOTE — Telephone Encounter (Signed)
RX done. 

## 2015-11-22 ENCOUNTER — Encounter (HOSPITAL_COMMUNITY): Payer: Medicare Other | Admitting: Occupational Therapy

## 2015-11-22 DIAGNOSIS — L03116 Cellulitis of left lower limb: Secondary | ICD-10-CM | POA: Diagnosis not present

## 2015-11-28 DIAGNOSIS — L03116 Cellulitis of left lower limb: Secondary | ICD-10-CM | POA: Diagnosis not present

## 2015-11-28 DIAGNOSIS — E1165 Type 2 diabetes mellitus with hyperglycemia: Secondary | ICD-10-CM | POA: Diagnosis not present

## 2016-01-16 DIAGNOSIS — E119 Type 2 diabetes mellitus without complications: Secondary | ICD-10-CM | POA: Diagnosis not present

## 2016-02-13 DIAGNOSIS — M7662 Achilles tendinitis, left leg: Secondary | ICD-10-CM | POA: Diagnosis not present

## 2016-02-13 DIAGNOSIS — M79672 Pain in left foot: Secondary | ICD-10-CM | POA: Diagnosis not present

## 2016-04-30 ENCOUNTER — Other Ambulatory Visit (HOSPITAL_COMMUNITY)
Admission: RE | Admit: 2016-04-30 | Discharge: 2016-04-30 | Disposition: A | Discharge status: Home / Self Care | Payer: Medicare Other | Source: Ambulatory Visit | Attending: Obstetrics & Gynecology | Admitting: Obstetrics & Gynecology

## 2016-04-30 ENCOUNTER — Ambulatory Visit (INDEPENDENT_AMBULATORY_CARE_PROVIDER_SITE_OTHER): Payer: Medicare Other | Admitting: Obstetrics & Gynecology

## 2016-04-30 ENCOUNTER — Encounter: Payer: Self-pay | Admitting: Obstetrics & Gynecology

## 2016-04-30 VITALS — BP 170/90 | HR 72 | Ht 61.0 in | Wt 167.0 lb

## 2016-04-30 DIAGNOSIS — Z01419 Encounter for gynecological examination (general) (routine) without abnormal findings: Secondary | ICD-10-CM | POA: Diagnosis not present

## 2016-04-30 DIAGNOSIS — Z1211 Encounter for screening for malignant neoplasm of colon: Secondary | ICD-10-CM

## 2016-04-30 DIAGNOSIS — Z01411 Encounter for gynecological examination (general) (routine) with abnormal findings: Secondary | ICD-10-CM | POA: Diagnosis not present

## 2016-04-30 DIAGNOSIS — Z1151 Encounter for screening for human papillomavirus (HPV): Secondary | ICD-10-CM | POA: Insufficient documentation

## 2016-04-30 DIAGNOSIS — Z1212 Encounter for screening for malignant neoplasm of rectum: Secondary | ICD-10-CM | POA: Diagnosis not present

## 2016-04-30 DIAGNOSIS — Z0000 Encounter for general adult medical examination without abnormal findings: Secondary | ICD-10-CM | POA: Diagnosis not present

## 2016-04-30 NOTE — Progress Notes (Signed)
Patient ID: Michelle Morrison, female   DOB: 1961-08-18, 55 y.o.   MRN: [id number] Subjective:     Michelle Morrison is a 55 y.o. female here for a routine exam.  Patient's last menstrual period was 05/23/2007. G2P2 Birth Control Method:  menoapausal Menstrual Calendar(currently): amenorrhea x 10 years  Current complaints: none.   Current acute medical issues:  none   Recent Gynecologic History Patient's last menstrual period was 05/23/2007. Last Pap: 2016,  normal Last mammogram: 2016,  normal       Past Medical History  Diagnosis Date  . Hyperlipidemia   . Hypertension   . Diabetes mellitus without complication ([place])   . Herpes simplex infection     Past Surgical History  Procedure Laterality Date  . Cesarean section    . Cholecystectomy    . Colonoscopy N/A 08/26/2014    Procedure: COLONOSCOPY;  Surgeon: [name], MD;  [facility];  Service: Endoscopy;  Laterality: N/A;  9:30 AM - moved to 8:30 - Ginger to notify pt    OB History    Gravida Para Term Preterm AB TAB SAB Ectopic Multiple Living   2 2              Social History   Social History  . Marital Status: Single    Spouse Name: N/A  . Number of Children: N/A  . Years of Education: N/A   Social History Main Topics  . Smoking status: Never Smoker   . Smokeless tobacco: Never Used  . Alcohol Use: No  . Drug Use: No  . Sexual Activity: No   Other Topics Concern  . None   Social History Narrative    Family History  Problem Relation Age of Onset  . Hypertension Mother   . Diabetes Mother   . Colon cancer Neg Hx      Current outpatient prescriptions:  .  acyclovir (ZOVIRAX) 400 MG tablet, TAKE 1 TABLET(400 MG) BY MOUTH FIVE TIMES DAILY, Disp: 50 tablet, Rfl: 0 .  amLODipine (NORVASC) 10 MG tablet, Take 10 mg by mouth daily., Disp: , Rfl:  .  hydrochlorothiazide (MICROZIDE) 12.5 MG capsule, Take 12.5 mg by mouth daily., Disp: , Rfl:  .  lisinopril (PRINIVIL,ZESTRIL) 40 MG tablet,  Take 40 mg by mouth daily., Disp: , Rfl:  .  metFORMIN (GLUCOPHAGE) 500 MG tablet, Take 500 mg by mouth 2 (two) times daily with a meal., Disp: , Rfl:  .  simvastatin (ZOCOR) 40 MG tablet, Take 40 mg by mouth daily., Disp: , Rfl:  .  traMADol (ULTRAM) 50 MG tablet, Take 1 tablet (50 mg total) by mouth every 6 (six) hours as needed., Disp: 15 tablet, Rfl: 0  Review of Systems  Review of Systems  Constitutional: Negative for fever, chills, weight loss, malaise/fatigue and diaphoresis.  HENT: Negative for hearing loss, ear pain, nosebleeds, congestion, sore throat, neck pain, tinnitus and ear discharge.   Eyes: Negative for blurred vision, double vision, photophobia, pain, discharge and redness.  Respiratory: Negative for cough, hemoptysis, sputum production, shortness of breath, wheezing and stridor.   Cardiovascular: Negative for chest pain, palpitations, orthopnea, claudication, leg swelling and PND.  Gastrointestinal: negative for abdominal pain. Negative for heartburn, nausea, vomiting, diarrhea, constipation, blood in stool and melena.  Genitourinary: Negative for dysuria, urgency, frequency, hematuria and flank pain.  Musculoskeletal: Negative for myalgias, back pain, joint pain and falls.  Skin: Negative for itching and rash.  Neurological: Negative for dizziness, tingling, tremors, sensory  change, speech change, focal weakness, seizures, loss of consciousness, weakness and headaches.  Endo/Heme/Allergies: Negative for environmental allergies and polydipsia. Does not bruise/bleed easily.  Psychiatric/Behavioral: Negative for depression, suicidal ideas, hallucinations, memory loss and substance abuse. The patient is not nervous/anxious and does not have insomnia.        Objective:  Blood pressure 170/90, pulse 72, height 5\' 1"  (1.549 m), weight 167 lb (75.751 kg), last menstrual period 05/23/2007.   Physical Exam  Vitals reviewed. Constitutional: She is oriented to person, place, and  time. She appears well-developed and well-nourished.  HENT:  Head: Normocephalic and atraumatic.        Right Ear: External ear normal.  Left Ear: External ear normal.  Nose: Nose normal.  Mouth/Throat: Oropharynx is clear and moist.  Eyes: Conjunctivae and EOM are normal. Pupils are equal, round, and reactive to light. Right eye exhibits no discharge. Left eye exhibits no discharge. No scleral icterus.  Neck: Normal range of motion. Neck supple. No tracheal deviation present. No thyromegaly present.  Cardiovascular: Normal rate, regular rhythm, normal heart sounds and intact distal pulses.  Exam reveals no gallop and no friction rub.   No murmur heard. Respiratory: Effort normal and breath sounds normal. No respiratory distress. She has no wheezes. She has no rales. She exhibits no tenderness.  GI: Soft. Bowel sounds are normal. She exhibits no distension and no mass. There is no tenderness. There is no rebound and no guarding.  Genitourinary:  Breasts no masses skin changes or nipple changes bilaterally      Vulva is normal without lesions Vagina is pink moist without discharge Cervix normal in appearance and pap is done Uterus is normal size shape and contour Adnexa is negative with normal sized ovaries  {Rectal    hemoccult negative, normal tone, no masses  Musculoskeletal: Normal range of motion. She exhibits no edema and no tenderness.  Neurological: She is alert and oriented to person, place, and time. She has normal reflexes. She displays normal reflexes. No cranial nerve deficit. She exhibits normal muscle tone. Coordination normal.  Skin: Skin is warm and dry. No rash noted. No erythema. No pallor.  Psychiatric: She has a normal mood and affect. Her behavior is normal. Judgment and thought content normal.       Medications Ordered at today's visit: No orders of the defined types were placed in this encounter.    Other orders placed at today's visit: No orders of the defined  types were placed in this encounter.      Assessment:    Healthy female exam.    Plan:    mammogram to be done in October      Return in about 1 year (around 04/30/2017) for yearly, with Dr Elonda Husky.

## 2016-05-01 LAB — CYTOLOGY - PAP

## 2016-05-02 DIAGNOSIS — E785 Hyperlipidemia, unspecified: Secondary | ICD-10-CM | POA: Diagnosis not present

## 2016-05-02 DIAGNOSIS — Z6828 Body mass index (BMI) 28.0-28.9, adult: Secondary | ICD-10-CM | POA: Diagnosis not present

## 2016-05-02 DIAGNOSIS — E1165 Type 2 diabetes mellitus with hyperglycemia: Secondary | ICD-10-CM | POA: Diagnosis not present

## 2016-05-02 DIAGNOSIS — I10 Essential (primary) hypertension: Secondary | ICD-10-CM | POA: Diagnosis not present

## 2016-05-03 DIAGNOSIS — I10 Essential (primary) hypertension: Secondary | ICD-10-CM | POA: Diagnosis not present

## 2016-05-03 DIAGNOSIS — Z0000 Encounter for general adult medical examination without abnormal findings: Secondary | ICD-10-CM | POA: Diagnosis not present

## 2016-05-03 DIAGNOSIS — E1165 Type 2 diabetes mellitus with hyperglycemia: Secondary | ICD-10-CM | POA: Diagnosis not present

## 2016-05-09 ENCOUNTER — Telehealth: Payer: Self-pay | Admitting: *Deleted

## 2016-05-09 NOTE — Telephone Encounter (Signed)
Pt informed of abnormal pap results (ASCUS with +HPV) from 04/30/2016. Colposcopy procedure scheduled and discussed and all questions answered with pt verbalizing understanding.

## 2016-05-20 ENCOUNTER — Encounter: Payer: Self-pay | Admitting: Obstetrics & Gynecology

## 2016-05-31 ENCOUNTER — Encounter: Payer: Self-pay | Admitting: Obstetrics & Gynecology

## 2016-05-31 ENCOUNTER — Ambulatory Visit (INDEPENDENT_AMBULATORY_CARE_PROVIDER_SITE_OTHER): Payer: Medicare Other | Admitting: Obstetrics & Gynecology

## 2016-05-31 VITALS — BP 150/100 | HR 76 | Wt 164.0 lb

## 2016-05-31 DIAGNOSIS — R896 Abnormal cytological findings in specimens from other organs, systems and tissues: Secondary | ICD-10-CM | POA: Diagnosis not present

## 2016-05-31 DIAGNOSIS — IMO0002 Reserved for concepts with insufficient information to code with codable children: Secondary | ICD-10-CM

## 2016-05-31 DIAGNOSIS — R87610 Atypical squamous cells of undetermined significance on cytologic smear of cervix (ASC-US): Secondary | ICD-10-CM | POA: Diagnosis not present

## 2016-05-31 DIAGNOSIS — R87810 Cervical high risk human papillomavirus (HPV) DNA test positive: Secondary | ICD-10-CM

## 2016-05-31 NOTE — Progress Notes (Signed)
Patient ID: Michelle Morrison, female   DOB: 08/24/1961, 55 y.o.   MRN: [id number] Colposcopy Procedure Note:  Colposcopy Procedure Note  Indications: Pap smear 1 months ago showed: ASCUS with POSITIVE high risk HPV. The prior pap showed ASCUS with POSITIVE high risk HPV.  Prior cervical/vaginal disease: normal exam without visible pathology. Prior cervical treatment: no treatment.  Smoker:  No. New sexual partner:  Yes.    : time frame:  6 months  History of abnormal Pap: yes  Procedure Details  The risks and benefits of the procedure and Written informed consent obtained.  Speculum placed in vagina and excellent visualization of cervix achieved, cervix swabbed x 3 with acetic acid solution.  Findings: Cervix: no visible lesions, no mosaicism, no punctation and no abnormal vasculature; no biopsies taken. Vaginal inspection: normal without visible lesions. Vulvar colposcopy: vulvar colposcopy not performed.  Specimens: none  Complications: none.  Plan: Follow up Pap in 1 year

## 2016-06-18 DIAGNOSIS — E1165 Type 2 diabetes mellitus with hyperglycemia: Secondary | ICD-10-CM | POA: Diagnosis not present

## 2016-06-18 DIAGNOSIS — I10 Essential (primary) hypertension: Secondary | ICD-10-CM | POA: Diagnosis not present

## 2016-06-21 DIAGNOSIS — E785 Hyperlipidemia, unspecified: Secondary | ICD-10-CM | POA: Diagnosis not present

## 2016-06-21 DIAGNOSIS — Z7984 Long term (current) use of oral hypoglycemic drugs: Secondary | ICD-10-CM | POA: Diagnosis not present

## 2016-06-21 DIAGNOSIS — E669 Obesity, unspecified: Secondary | ICD-10-CM | POA: Diagnosis not present

## 2016-06-21 DIAGNOSIS — E1165 Type 2 diabetes mellitus with hyperglycemia: Secondary | ICD-10-CM | POA: Diagnosis not present

## 2016-06-21 DIAGNOSIS — I10 Essential (primary) hypertension: Secondary | ICD-10-CM | POA: Diagnosis not present

## 2016-06-21 DIAGNOSIS — Z6828 Body mass index (BMI) 28.0-28.9, adult: Secondary | ICD-10-CM | POA: Diagnosis not present

## 2016-06-26 DIAGNOSIS — E785 Hyperlipidemia, unspecified: Secondary | ICD-10-CM | POA: Diagnosis not present

## 2016-06-26 DIAGNOSIS — Z6828 Body mass index (BMI) 28.0-28.9, adult: Secondary | ICD-10-CM | POA: Diagnosis not present

## 2016-06-26 DIAGNOSIS — E669 Obesity, unspecified: Secondary | ICD-10-CM | POA: Diagnosis not present

## 2016-06-26 DIAGNOSIS — I10 Essential (primary) hypertension: Secondary | ICD-10-CM | POA: Diagnosis not present

## 2016-06-26 DIAGNOSIS — E1165 Type 2 diabetes mellitus with hyperglycemia: Secondary | ICD-10-CM | POA: Diagnosis not present

## 2016-06-26 DIAGNOSIS — Z7984 Long term (current) use of oral hypoglycemic drugs: Secondary | ICD-10-CM | POA: Diagnosis not present

## 2016-07-03 DIAGNOSIS — Z7984 Long term (current) use of oral hypoglycemic drugs: Secondary | ICD-10-CM | POA: Diagnosis not present

## 2016-07-03 DIAGNOSIS — E669 Obesity, unspecified: Secondary | ICD-10-CM | POA: Diagnosis not present

## 2016-07-03 DIAGNOSIS — E785 Hyperlipidemia, unspecified: Secondary | ICD-10-CM | POA: Diagnosis not present

## 2016-07-03 DIAGNOSIS — E1165 Type 2 diabetes mellitus with hyperglycemia: Secondary | ICD-10-CM | POA: Diagnosis not present

## 2016-07-03 DIAGNOSIS — Z6828 Body mass index (BMI) 28.0-28.9, adult: Secondary | ICD-10-CM | POA: Diagnosis not present

## 2016-07-03 DIAGNOSIS — I10 Essential (primary) hypertension: Secondary | ICD-10-CM | POA: Diagnosis not present

## 2016-07-05 DIAGNOSIS — E1165 Type 2 diabetes mellitus with hyperglycemia: Secondary | ICD-10-CM | POA: Diagnosis not present

## 2016-07-05 DIAGNOSIS — Z6828 Body mass index (BMI) 28.0-28.9, adult: Secondary | ICD-10-CM | POA: Diagnosis not present

## 2016-07-05 DIAGNOSIS — E669 Obesity, unspecified: Secondary | ICD-10-CM | POA: Diagnosis not present

## 2016-07-05 DIAGNOSIS — Z7984 Long term (current) use of oral hypoglycemic drugs: Secondary | ICD-10-CM | POA: Diagnosis not present

## 2016-07-05 DIAGNOSIS — I10 Essential (primary) hypertension: Secondary | ICD-10-CM | POA: Diagnosis not present

## 2016-07-05 DIAGNOSIS — E785 Hyperlipidemia, unspecified: Secondary | ICD-10-CM | POA: Diagnosis not present

## 2016-07-09 DIAGNOSIS — E1165 Type 2 diabetes mellitus with hyperglycemia: Secondary | ICD-10-CM | POA: Diagnosis not present

## 2016-07-09 DIAGNOSIS — Z7984 Long term (current) use of oral hypoglycemic drugs: Secondary | ICD-10-CM | POA: Diagnosis not present

## 2016-07-09 DIAGNOSIS — Z6828 Body mass index (BMI) 28.0-28.9, adult: Secondary | ICD-10-CM | POA: Diagnosis not present

## 2016-07-09 DIAGNOSIS — E669 Obesity, unspecified: Secondary | ICD-10-CM | POA: Diagnosis not present

## 2016-07-09 DIAGNOSIS — E785 Hyperlipidemia, unspecified: Secondary | ICD-10-CM | POA: Diagnosis not present

## 2016-07-09 DIAGNOSIS — I10 Essential (primary) hypertension: Secondary | ICD-10-CM | POA: Diagnosis not present

## 2016-07-15 DIAGNOSIS — Z6828 Body mass index (BMI) 28.0-28.9, adult: Secondary | ICD-10-CM | POA: Diagnosis not present

## 2016-07-15 DIAGNOSIS — E1100 Type 2 diabetes mellitus with hyperosmolarity without nonketotic hyperglycemic-hyperosmolar coma (NKHHC): Secondary | ICD-10-CM | POA: Diagnosis not present

## 2016-07-15 DIAGNOSIS — E785 Hyperlipidemia, unspecified: Secondary | ICD-10-CM | POA: Diagnosis not present

## 2016-07-15 DIAGNOSIS — E119 Type 2 diabetes mellitus without complications: Secondary | ICD-10-CM | POA: Diagnosis not present

## 2016-07-15 DIAGNOSIS — I10 Essential (primary) hypertension: Secondary | ICD-10-CM | POA: Diagnosis not present

## 2016-09-09 ENCOUNTER — Other Ambulatory Visit: Payer: Self-pay | Admitting: Internal Medicine

## 2016-09-09 DIAGNOSIS — Z1231 Encounter for screening mammogram for malignant neoplasm of breast: Secondary | ICD-10-CM

## 2016-09-18 DIAGNOSIS — J069 Acute upper respiratory infection, unspecified: Secondary | ICD-10-CM | POA: Diagnosis not present

## 2016-09-18 DIAGNOSIS — J309 Allergic rhinitis, unspecified: Secondary | ICD-10-CM | POA: Diagnosis not present

## 2016-09-18 DIAGNOSIS — E1165 Type 2 diabetes mellitus with hyperglycemia: Secondary | ICD-10-CM | POA: Diagnosis not present

## 2016-09-24 ENCOUNTER — Encounter: Payer: Self-pay | Admitting: Adult Health

## 2016-09-24 ENCOUNTER — Ambulatory Visit: Payer: Medicare Other | Admitting: Adult Health

## 2016-09-24 ENCOUNTER — Encounter (INDEPENDENT_AMBULATORY_CARE_PROVIDER_SITE_OTHER): Payer: Self-pay

## 2016-09-24 ENCOUNTER — Ambulatory Visit (INDEPENDENT_AMBULATORY_CARE_PROVIDER_SITE_OTHER): Payer: Medicare Other | Admitting: Adult Health

## 2016-09-24 VITALS — BP 150/62 | HR 64 | Ht 61.0 in | Wt 162.5 lb

## 2016-09-24 DIAGNOSIS — R109 Unspecified abdominal pain: Secondary | ICD-10-CM

## 2016-09-24 DIAGNOSIS — R159 Full incontinence of feces: Secondary | ICD-10-CM

## 2016-09-24 DIAGNOSIS — K649 Unspecified hemorrhoids: Secondary | ICD-10-CM | POA: Diagnosis not present

## 2016-09-24 MED ORDER — HYDROCORTISONE ACE-PRAMOXINE 1-1 % RE FOAM: 1.0000 | Freq: Three times a day (TID) | RECTAL | 1 refills | Status: DC

## 2016-09-24 MED ORDER — DICYCLOMINE HCL 10 MG PO CAPS: 10.0000 mg | ORAL_CAPSULE | Freq: Three times a day (TID) | ORAL | 1 refills | Status: DC

## 2016-09-24 NOTE — Progress Notes (Signed)
Subjective:     Patient ID: Michelle Morrison, female   DOB: 08-09-1961, 55 y.o.   MRN: [id number]  HPI Michelle Morrison is a 55 year old black female in complaining of stomach cramps and loss of stool at times with sex and orgasm and if stomach cramps really bad. Had colonoscopy 08/27/14, by Dr Eden Lathe and GYN physical in June with Dr Elonda Husky, and this problem has started since then.   Review of Systems + stomach cramps + loss of stool at times with sex/orgasm and when stomach cramps bad Reviewed past medical,surgical, social and family history. Reviewed medications and allergies.     Objective:   Physical Exam BP (!) 150/62 (BP Location: Left Arm, Patient Position: Sitting, Cuff Size: Normal)   Pulse 64   Ht 5\' 1"  (1.549 m)   Wt 162 lb 8 oz (73.7 kg)   LMP 05/23/2007   BMI 30.70 kg/m PHQ 2 score 1. Skin warm and dry.Pelvic: external genitalia is normal in appearance no lesions, vagina: scant discharge without odor,urethra has no lesions or masses noted, cervix:smooth, uterus: normal size, shape and contour, non tender, no masses felt, adnexa: no masses or tenderness noted. Bladder is non tender and no masses felt.   On rectal exam has good tone, no polyps, has + hemorrhoid internal and external.  Assessment:     1. Stomach cramps   2. Incontinence of feces, unspecified fecal incontinence type   3. Hemorrhoids, unspecified hemorrhoid type       Plan:     Rx bentyl 10 mg #90 take with meals and HS with 1 refill Rx proctofoam HC use tid prn with 1 refill No sex Follow up in 2 weeks,if not better refer to Dr Oneida Alar

## 2016-09-24 NOTE — Patient Instructions (Addendum)
Take bentyl 10 mg with meals and bedtime Use proctofoam on hemorrhoids Follow up in 2 weeks  No sex

## 2016-09-27 ENCOUNTER — Ambulatory Visit: Payer: Medicare Other

## 2016-09-30 ENCOUNTER — Telehealth: Payer: Self-pay | Admitting: *Deleted

## 2016-09-30 NOTE — Telephone Encounter (Signed)
I spoke with pt letting her know her insurance has denied covering Proctofoam. I spoke with JAG and let her know. JAG advised pt can pay cash for Proctofoam, she can come by and pick up sample of Analpram, or she can try Prep H. Pt to come by and pick up sample of Analpram, we just have 1 tube, and then I advised to try Prep H. Pt voiced understanding. Analpram lot # [id number] exp 6/18. Pt to pick up at front desk. [place]

## 2016-09-30 NOTE — Telephone Encounter (Signed)
Spoke with pt letting her know insurance has denied covering Proctofoam. Pt to come by and pick up sample of Analpram and then try Prep H per JAG. Pt voiced understanding. Michelle Morrison

## 2016-10-08 ENCOUNTER — Encounter: Payer: Self-pay | Admitting: Adult Health

## 2016-10-08 ENCOUNTER — Ambulatory Visit (INDEPENDENT_AMBULATORY_CARE_PROVIDER_SITE_OTHER): Payer: Medicare Other | Admitting: Adult Health

## 2016-10-08 VITALS — BP 140/62 | HR 76 | Ht 61.0 in | Wt 160.0 lb

## 2016-10-08 DIAGNOSIS — K649 Unspecified hemorrhoids: Secondary | ICD-10-CM

## 2016-10-08 DIAGNOSIS — R252 Cramp and spasm: Secondary | ICD-10-CM | POA: Diagnosis not present

## 2016-10-08 MED ORDER — DICYCLOMINE HCL 10 MG PO CAPS: 10.0000 mg | ORAL_CAPSULE | Freq: Three times a day (TID) | ORAL | 3 refills | Status: DC

## 2016-10-08 NOTE — Patient Instructions (Addendum)
Continue bentyl  Follow up with me in 3 months

## 2016-10-08 NOTE — Progress Notes (Signed)
Subjective:     Patient ID: Michelle Morrison, female   DOB: [birth year], 55 y.o.   MRN: [id number]  HPI Michelle Morrison is a 55 year old black female in for follow up on starting bentyl  For stomach cramp and using cortisone cream on hemorrhoids, and has not had sex.  Review of Systems  Stomach cramps better no fecal incontinence since last visit  Reviewed past medical,surgical, social and family history. Reviewed medications and allergies.     Objective:   Physical Exam BP 140/62 (BP Location: Left Arm, Patient Position: Sitting, Cuff Size: Normal)   Pulse 76   Ht 5\' 1"  (1.549 m)   Wt 160 lb (72.6 kg)   LMP 05/23/2007   BMI 30.23 kg/m Skin warm and dry, hemorrhoid, not swollen or irritated now,PHQ 2 score 1.    Assessment:     Hemorrhoids     Plan:    Use cream on hemorrhoids  If has sex, no rectal sex, and have him withdraw before ejaculation  Refilled bentyl 10 mg 1 qid #90 with 3 refills Follow up in 3 months or before if needed

## 2016-10-09 ENCOUNTER — Ambulatory Visit (HOSPITAL_COMMUNITY): Payer: Medicare Other

## 2016-10-09 ENCOUNTER — Telehealth: Payer: Self-pay | Admitting: Adult Health

## 2016-10-09 DIAGNOSIS — R159 Full incontinence of feces: Secondary | ICD-10-CM

## 2016-10-09 DIAGNOSIS — R197 Diarrhea, unspecified: Secondary | ICD-10-CM

## 2016-10-09 NOTE — Telephone Encounter (Signed)
Pt had episode last night and this morning of diarrhea and fecal incontinence  will refer to Dr Oneida Alar

## 2016-10-10 ENCOUNTER — Encounter: Payer: Self-pay | Admitting: Gastroenterology

## 2016-10-14 ENCOUNTER — Telehealth: Payer: Self-pay | Admitting: Adult Health

## 2016-10-14 NOTE — Telephone Encounter (Signed)
Pt called stating that she would like a call back from [place], [facility] did not state the reason why,please contact pt.

## 2016-10-14 NOTE — Telephone Encounter (Signed)
busy

## 2016-10-28 ENCOUNTER — Ambulatory Visit: Payer: Medicare Other | Admitting: Nurse Practitioner

## 2016-10-30 ENCOUNTER — Ambulatory Visit (HOSPITAL_COMMUNITY): Payer: Medicare Other

## 2016-11-04 ENCOUNTER — Ambulatory Visit: Payer: Medicare Other | Admitting: Nurse Practitioner

## 2016-11-04 NOTE — Progress Notes (Unsigned)
Referring Provider: Estill Dooms, NP Primary Care Physician:  Rosita Fire, MD Primary GI:  Dr. Oneida Alar  No chief complaint on file.   HPI:   Michelle Morrison is a 55 y.o. female who presents for evaluation of diarrhea and incontinence. The patient was last seen by our service for screening colonoscopy on 08/26/2014. Colonoscopy found 6 mm sessile polyp in the sigmoid colon, left colon redundant, moderate size internal hemorrhoids. Surgical pathology found the polyp to be hyperplastic. Recommended high-fiber diet, repeat screening colonoscopy in 10 years.  Today she states   Past Medical History:  Diagnosis Date  . Diabetes mellitus without complication ([place])   . Herpes simplex infection   . Hyperlipidemia   . Hypertension     Past Surgical History:  Procedure Laterality Date  . CESAREAN SECTION    . CHOLECYSTECTOMY    . COLONOSCOPY N/A 08/26/2014   Procedure: COLONOSCOPY;  Surgeon: [name], MD;  [facility]: [facility];  Service: Endoscopy;  Laterality: N/A;  9:30 AM - moved to 8:30 - Ginger to notify pt    Current Outpatient Prescriptions  Medication Sig Dispense Refill  . amLODipine (NORVASC) 10 MG tablet Take 10 mg by mouth daily.    Marland Kitchen dicyclomine (BENTYL) 10 MG capsule Take 1 capsule (10 mg total) by mouth 4 (four) times daily -  before meals and at bedtime. 90 capsule 3  . fluticasone (FLONASE) 50 MCG/ACT nasal spray Place 1 spray into both nostrils daily.     . hydrochlorothiazide (MICROZIDE) 12.5 MG capsule Take 12.5 mg by mouth daily.    . hydrocortisone-pramoxine (ANALPRAM-HC) 2.5-1 % rectal cream Place 1 application rectally daily.    Marland Kitchen lisinopril (PRINIVIL,ZESTRIL) 40 MG tablet Take 40 mg by mouth daily.    . predniSONE (DELTASONE) 10 MG tablet Take 10 mg by mouth daily with breakfast.     . simvastatin (ZOCOR) 40 MG tablet Take 40 mg by mouth daily.    . TRADJENTA 5 MG TABS tablet Take 5 mg by mouth daily.      No current facility-administered  medications for this visit.     Allergies as of 11/04/2016  . (No Known Allergies)    Family History  Problem Relation Age of Onset  . Hypertension Mother   . Diabetes Mother   . Colon cancer Neg Hx     Social History   Social History  . Marital status: Single    Spouse name: N/A  . Number of children: N/A  . Years of education: N/A   Social History Main Topics  . Smoking status: Former Smoker    Types: Cigarettes  . Smokeless tobacco: Never Used  . Alcohol use No  . Drug use: No  . Sexual activity: Not Currently    Birth control/ protection: Post-menopausal   Other Topics Concern  . Not on file   Social History Narrative  . No narrative on file    Review of Systems: Complete ROS negative except as per HPI.   Physical Exam: LMP 05/23/2007  General:   Alert and oriented. Pleasant and cooperative. Well-nourished and well-developed.  Head:  Normocephalic and atraumatic. Eyes:  Without icterus, sclera clear and conjunctiva pink.  Ears:  Normal auditory acuity. Mouth:  No deformity or lesions, oral mucosa pink.  Throat/Neck:  Supple, without mass or thyromegaly. Cardiovascular:  S1, S2 present without murmurs appreciated. Normal pulses noted. Extremities without clubbing or edema. Respiratory:  Clear to auscultation bilaterally. No wheezes, rales, or rhonchi.  No distress.  Gastrointestinal:  +BS, soft, non-tender and non-distended. No HSM noted. No guarding or rebound. No masses appreciated.  Rectal:  Deferred  Musculoskalatal:  Symmetrical without gross deformities. Normal posture. Skin:  Intact without significant lesions or rashes. Neurologic:  Alert and oriented x4;  grossly normal neurologically. Psych:  Alert and cooperative. Normal mood and affect. Heme/Lymph/Immune: No significant cervical adenopathy. No excessive bruising noted.    11/04/2016 9:13 AM   Disclaimer: This note was dictated with voice recognition software. Similar sounding words can  inadvertently be transcribed and may not be corrected upon review.

## 2016-11-06 ENCOUNTER — Ambulatory Visit (HOSPITAL_COMMUNITY)
Admission: RE | Admit: 2016-11-06 | Discharge: 2016-11-06 | Disposition: A | Discharge status: Home / Self Care | Payer: Medicare Other | Source: Ambulatory Visit | Attending: Internal Medicine | Admitting: Internal Medicine

## 2016-11-06 DIAGNOSIS — Z1231 Encounter for screening mammogram for malignant neoplasm of breast: Secondary | ICD-10-CM | POA: Diagnosis not present

## 2016-12-09 ENCOUNTER — Encounter: Payer: Self-pay | Admitting: Nurse Practitioner

## 2016-12-09 ENCOUNTER — Ambulatory Visit (HOSPITAL_COMMUNITY)
Admission: RE | Admit: 2016-12-09 | Discharge: 2016-12-09 | Disposition: A | Discharge status: Home / Self Care | Payer: Medicare Other | Source: Ambulatory Visit | Attending: Nurse Practitioner | Admitting: Nurse Practitioner

## 2016-12-09 ENCOUNTER — Ambulatory Visit (INDEPENDENT_AMBULATORY_CARE_PROVIDER_SITE_OTHER): Payer: Medicare Other | Admitting: Nurse Practitioner

## 2016-12-09 DIAGNOSIS — R1030 Lower abdominal pain, unspecified: Secondary | ICD-10-CM | POA: Diagnosis not present

## 2016-12-09 DIAGNOSIS — R197 Diarrhea, unspecified: Secondary | ICD-10-CM | POA: Diagnosis not present

## 2016-12-09 DIAGNOSIS — K9089 Other intestinal malabsorption: Secondary | ICD-10-CM | POA: Insufficient documentation

## 2016-12-09 DIAGNOSIS — R109 Unspecified abdominal pain: Secondary | ICD-10-CM | POA: Insufficient documentation

## 2016-12-09 NOTE — Progress Notes (Signed)
Referring Provider: Estill Dooms, NP Primary Care Physician:  Rosita Fire, MD Primary GI:  Dr. Oneida Alar  Chief Complaint  Patient presents with  . Diarrhea    incontinence at times  . Abdominal Pain    mid lower abd cramps    HPI:   Michelle Morrison is a 56 y.o. female who presents With complaints of abdominal pain and diarrhea. The patient was last seen by our service on 08/26/2014 for colonoscopy for average risk colon cancer screening. Findings included normal terminal ileum, sessile polyp 6 mm in the sigmoid colon, redundant colon, moderate sized internal hemorrhoids. Recommended high-fiber diet, avoid foods that cause bloating and gas, next colonoscopy in 10 years with an overtube. Surgical pathology as per below.   Reviewed PCP notes, last saw primary care 10/08/2016 at which point she was seen for follow-up for Bentyl for stomach cramps and hydrocortisone rectal cream. Noted stomach cramps better, no fecal incontinence since previous visit. Recommended 3 month follow-up. A review of her med list today indicates she is not currently taking Bentyl or rectal cream for hemorrhoids.  Patient is a difficult historian. Today she states she's having diarrhea, abdominal pain, and fecal incontinence. This has been "going on for a while" and unable to quantify. Abdominal pain is lower and described as crampy, intermittent. Abdominal pain improves with bowel movement. Most of the time soft stools to diarrhea. She has had these symptoms since having her gallbladder out. Bentyl helped the pain for a little bit but then stopped working. Denies hematochezia, melena, nausea, vomiting, fever, chills, unintentional weight loss. Denies chest pain, dyspnea, dizziness, lightheadedness, syncope, near syncope. Denies any other upper or lower GI symptoms.     Past Medical History:  Diagnosis Date  . Diabetes mellitus without complication ([place])   . Herpes simplex infection   . Hyperlipidemia   .  Hypertension     Past Surgical History:  Procedure Laterality Date  . CESAREAN SECTION    . CHOLECYSTECTOMY    . COLONOSCOPY N/A 08/26/2014   Procedure: COLONOSCOPY;  Surgeon: [name], MD;  [facility];  Service: Endoscopy;  Laterality: N/A;  9:30 AM - moved to 8:30 - Ginger to notify pt    Current Outpatient Prescriptions  Medication Sig Dispense Refill  . amLODipine (NORVASC) 10 MG tablet Take 10 mg by mouth daily.    . hydrochlorothiazide (MICROZIDE) 12.5 MG capsule Take 12.5 mg by mouth daily.    Marland Kitchen lisinopril (PRINIVIL,ZESTRIL) 40 MG tablet Take 40 mg by mouth daily.    . simvastatin (ZOCOR) 40 MG tablet Take 40 mg by mouth daily.    . TRADJENTA 5 MG TABS tablet Take 5 mg by mouth daily.     Marland Kitchen dicyclomine (BENTYL) 10 MG capsule Take 1 capsule (10 mg total) by mouth 4 (four) times daily -  before meals and at bedtime. (Patient not taking: Reported on 12/09/2016) 90 capsule 3  . fluticasone (FLONASE) 50 MCG/ACT nasal spray Place 1 spray into both nostrils daily.     . hydrocortisone-pramoxine (ANALPRAM-HC) 2.5-1 % rectal cream Place 1 application rectally daily.    . predniSONE (DELTASONE) 10 MG tablet Take 10 mg by mouth daily with breakfast.      No current facility-administered medications for this visit.     Allergies as of 12/09/2016  . (No Known Allergies)    Family History  Problem Relation Age of Onset  . Hypertension Mother   . Diabetes Mother   .  Colon cancer Neg Hx     Social History   Social History  . Marital status: Single    Spouse name: N/A  . Number of children: N/A  . Years of education: N/A   Social History Main Topics  . Smoking status: Former Smoker    Types: Cigarettes  . Smokeless tobacco: Never Used  . Alcohol use No  . Drug use: No  . Sexual activity: Not Currently    Birth control/ protection: Post-menopausal   Other Topics Concern  . None   Social History Narrative  . None    Review of Systems: Complete ROS  negative except as per HPI.   Physical Exam: BP (!) 177/73   Pulse (!) 59   Temp 97.6 F (36.4 C) (Oral)   Ht 5\' 1"  (1.549 m)   Wt 158 lb 12.8 oz (72 kg)   LMP 05/23/2007   BMI 30.00 kg/m  General:   Alert and oriented. Pleasant and cooperative. Well-nourished and well-developed.  Head:  Normocephalic and atraumatic. Eyes:  Without icterus, sclera clear and conjunctiva pink.  Ears:  Normal auditory acuity. Cardiovascular:  S1, S2 present without murmurs appreciated. Extremities without clubbing or edema. Respiratory:  Clear to auscultation bilaterally. No wheezes, rales, or rhonchi. No distress.  Gastrointestinal:  +BS, soft, and non-distended. Minimal lower abdominal TTP. No HSM noted. No guarding or rebound. No masses appreciated.  Rectal:  Deferred  Musculoskalatal:  Symmetrical without gross deformities. Neurologic:  Alert and oriented x4;  grossly normal neurologically. Psych:  Alert and cooperative. Normal mood and affect. Heme/Lymph/Immune: No excessive bruising noted.    12/09/2016 11:27 AM   Disclaimer: This note was dictated with voice recognition software. Similar sounding words can inadvertently be transcribed and may not be corrected upon review.

## 2016-12-09 NOTE — Patient Instructions (Signed)
1. Have your x-ray done when you're able to. 2. We will give you cups to collect a stool sample. When you collect them bring it to the lab, not to our office. 3. When we get the results of your tests we can decide what medication to try to help with your symptoms. 4. Return for follow-up in 6-8 weeks.

## 2016-12-13 NOTE — Assessment & Plan Note (Signed)
The patient has persistent/chronic soft/pasty stools to diarrhea. She states 203 times this has been going on since her cholecystectomy. Also associated lower abdominal cramping relieved after bowel movement. No red flag/warning signs. Differentials include exocrine pancreatic insufficiency, IBS, overflow diarrhea, bile salt diarrhea; much less likely infection given length of chronic symptoms. I will check abdominal XRay, pancreatic fecal elastase. Treatment options based on results. Return for follow-up in 6-8 weeks.

## 2016-12-13 NOTE — Assessment & Plan Note (Signed)
Abdominal pain in conjunction with her soft to loose stools, relieved after bowel movement. Differentials as per above, further workup as per above. Return for follow up in 6 weeks. Call with any worsening/severe symptoms. No red flag/warning signs or symptoms today.

## 2016-12-16 NOTE — Progress Notes (Signed)
LMOM to call.

## 2016-12-16 NOTE — Progress Notes (Signed)
Pt is aware.  

## 2016-12-16 NOTE — Progress Notes (Signed)
CC'ED TO PCP 

## 2016-12-16 NOTE — Progress Notes (Signed)
Pt called and left VM , I called her and got VM and I left the result message on her vm and told her to do the stool test and call with questions.

## 2016-12-17 ENCOUNTER — Other Ambulatory Visit: Payer: Self-pay | Admitting: Nurse Practitioner

## 2016-12-17 DIAGNOSIS — R1030 Lower abdominal pain, unspecified: Secondary | ICD-10-CM | POA: Diagnosis not present

## 2016-12-17 DIAGNOSIS — R197 Diarrhea, unspecified: Secondary | ICD-10-CM | POA: Diagnosis not present

## 2016-12-20 LAB — PANCREATIC ELASTASE, FECAL

## 2016-12-24 NOTE — Progress Notes (Signed)
PT is aware.

## 2017-01-08 ENCOUNTER — Encounter: Payer: Self-pay | Admitting: Adult Health

## 2017-01-08 ENCOUNTER — Ambulatory Visit (INDEPENDENT_AMBULATORY_CARE_PROVIDER_SITE_OTHER): Payer: Medicare Other | Admitting: Adult Health

## 2017-01-08 VITALS — BP 160/80 | HR 62 | Ht 61.0 in | Wt 157.0 lb

## 2017-01-08 DIAGNOSIS — R109 Unspecified abdominal pain: Secondary | ICD-10-CM

## 2017-01-08 DIAGNOSIS — R197 Diarrhea, unspecified: Secondary | ICD-10-CM

## 2017-01-08 NOTE — Progress Notes (Signed)
Subjective:     Patient ID: Michelle Morrison, female   DOB: May 22, 1961, 56 y.o.   MRN: [id number]  HPI Michelle Morrison is a 56 year old black female in complaining of stomach cramps and diarrhea at times and gets on panties.She is seeing Walden Field, NP and has had labs.This is chronic for her since having cholecystectomy. PCP is Dr Legrand Rams.   Review of Systems +cramps and diarrhea at times,and gets on panties  Reviewed past medical,surgical, social and family history. Reviewed medications and allergies.     Objective:   Physical Exam BP (!) 160/80 (BP Location: Right Arm, Patient Position: Sitting, Cuff Size: Normal)   Pulse 62   Ht 5\' 1"  (1.549 m)   Wt 157 lb (71.2 kg)   LMP 05/23/2007   BMI 29.66 kg/m    Skin warm and dry.Pelvic: external genitalia is normal in appearance no lesions, vagina: pink, with loss of moisture and rugae,urethra has no lesions or masses noted, cervix:smooth and bulbous, uterus: normal size, shape and contour, non tender, no masses felt, adnexa: no masses or tenderness noted. Bladder is non tender and no masses felt.  PHQ 2 score 1.  Assessment:     1. Stomach cramps   2. Diarrhea, unspecified type       Plan:     Try cotton ball at anal area for seepage  Follow up prn F/U with Eliberto Ivory as scheduled

## 2017-01-08 NOTE — Patient Instructions (Signed)
F/U with Michelle Morrison as scheduled

## 2017-01-20 ENCOUNTER — Ambulatory Visit (INDEPENDENT_AMBULATORY_CARE_PROVIDER_SITE_OTHER): Payer: Medicare Other | Admitting: Nurse Practitioner

## 2017-01-20 ENCOUNTER — Encounter: Payer: Self-pay | Admitting: Nurse Practitioner

## 2017-01-20 VITALS — BP 205/80 | HR 52 | Temp 97.9°F | Ht 61.0 in | Wt 158.4 lb

## 2017-01-20 DIAGNOSIS — R1030 Lower abdominal pain, unspecified: Secondary | ICD-10-CM | POA: Diagnosis not present

## 2017-01-20 DIAGNOSIS — R197 Diarrhea, unspecified: Secondary | ICD-10-CM

## 2017-01-20 MED ORDER — CHOLESTYRAMINE 4 G PO PACK: 4.0000 g | PACK | Freq: Three times a day (TID) | ORAL | 12 refills | Status: DC

## 2017-01-20 NOTE — Patient Instructions (Addendum)
1. We will help you schedule your CT scan of your abdomen. We will work on getting this approved by your insurance. 2. You can start taking cholestyramine 4 g, 3 times a day as needed for diarrhea. 3. Return for follow-up in 4 weeks. 4. Call us and let us know if the new medicine is not working after at least 2 weeks of trying it.

## 2017-01-20 NOTE — Assessment & Plan Note (Addendum)
Into knees with diarrhea most bowel movements. States this started after her cholecystectomy. Because of cholecystectomy she is not a candidate for Viberzi. Bentyl has failed. 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 I will have her follow-up in 6 weeks to further evaluate. ER precautions given.

## 2017-01-20 NOTE — Progress Notes (Signed)
Referring Provider: Rosita Fire, MD Primary Care Physician:  Rosita Fire, MD Primary GI:  Dr. Oneida Alar  Chief Complaint  Patient presents with  . Diarrhea  . Abdominal Pain    lower abd cramps    HPI:   Michelle Morrison is a 56 y.o. female who presents For follow-up on diarrhea and lower abdominal pain. The patient was last seen in our office 12/09/2016 for the same. At that time she was noted to be a difficult historian. However, she stated she was having diarrhea, abdominal pain, and fecal incontinence "for some while" and unable to quantify. Abdominal pain was lower in crampy, intermittent but improves a bowel movement. Most the time soft stools to diarrhea. Has had these symptoms and having her gallbladder out, Bentyl helped a little initially but then stopped working. At that time we checked an abdominal x-ray, pancreatic fecal elastase. Fecal elastase was found to be normal. X-ray found nonobstructed bowel gas pattern and mild colonic stool burden.  Colonoscopy up-to-date completed 08/26/2014 with a single polyp removed and hyperplastic on pathology recommended 10 year repeat.  Patient is a difficult historian. Today she states she's doing well. Still with diarrhea most of the time, but most of the times. Also with lower abdominal pain, sometimes improves with bowel movement. States symptoms started after cholecystectomy. Denies hematochezia, melena, fever, chills, unintentional weight loss. Objectively weight is stable. Bentyl not effective anymore. Denies chest pain, dyspnea, dizziness, lightheadedness, syncope, near syncope. Denies any other upper or lower GI symptoms.  Past Medical History:  Diagnosis Date  . Diabetes mellitus without complication ([place])   . Diarrhea   . Herpes simplex infection   . Hyperlipidemia   . Hypertension     Past Surgical History:  Procedure Laterality Date  . CESAREAN SECTION    . CHOLECYSTECTOMY    . COLONOSCOPY N/A 08/26/2014   Procedure:  COLONOSCOPY;  Surgeon: [name], MD;  [facility];  Service: Endoscopy;  Laterality: N/A;  9:30 AM - moved to 8:30 - Ginger to notify pt    Current Outpatient Prescriptions  Medication Sig Dispense Refill  . amLODipine (NORVASC) 10 MG tablet Take 10 mg by mouth daily.    . hydrochlorothiazide (MICROZIDE) 12.5 MG capsule Take 12.5 mg by mouth daily.    Marland Kitchen lisinopril (PRINIVIL,ZESTRIL) 40 MG tablet Take 40 mg by mouth daily.    . simvastatin (ZOCOR) 40 MG tablet Take 40 mg by mouth daily.    . TRADJENTA 5 MG TABS tablet Take 5 mg by mouth daily.      No current facility-administered medications for this visit.     Allergies as of 01/20/2017  . (No Known Allergies)    Family History  Problem Relation Age of Onset  . Hypertension Mother   . Diabetes Mother   . Colon cancer Neg Hx     Social History   Social History  . Marital status: Single    Spouse name: N/A  . Number of children: N/A  . Years of education: N/A   Social History Main Topics  . Smoking status: Former Smoker    Types: Cigarettes  . Smokeless tobacco: Never Used  . Alcohol use No  . Drug use: No  . Sexual activity: Not Currently    Birth control/ protection: Post-menopausal   Other Topics Concern  . None   Social History Narrative  . None    Review of Systems: Complete ROS negative except as per HPI.   Physical  Exam: BP (!) 205/80   Pulse (!) 52   Temp 97.9 F (36.6 C) (Oral)   Ht 5\' 1"  (1.549 m)   Wt 158 lb 6.4 oz (71.8 kg)   LMP 05/23/2007   BMI 29.93 kg/m  General:   Obese female. Alert and oriented. Pleasant and cooperative. Well-nourished and well-developed.  Head:  Normocephalic and atraumatic. Eyes:  Without icterus, sclera clear and conjunctiva pink.  Ears:  Normal auditory acuity. Cardiovascular:  S1, S2 present without murmurs appreciated. Extremities without clubbing or edema. Respiratory:  Clear to auscultation bilaterally. No wheezes, rales, or rhonchi. No  distress.  Gastrointestinal:  +BS, soft, and non-distended. Moderate TTP lower abdomen and mid-abdomen. No HSM noted. No guarding or rebound. No masses appreciated.  Rectal:  Deferred  Musculoskalatal:  Symmetrical without gross deformities. Neurologic:  Alert and oriented x4;  grossly normal neurologically. Psych:  Alert and cooperative. Normal mood and affect. Heme/Lymph/Immune: No excessive bruising noted.    01/20/2017 10:03 AM   Disclaimer: This note was dictated with voice recognition software. Similar sounding words can inadvertently be transcribed and may not be corrected upon review.

## 2017-01-20 NOTE — Assessment & Plan Note (Signed)
Continues with lower abdominal pain and mid abdominal pain/periumbilical region. On exam her pain seems worse and she describes with noted wincing and other objective findings to suggest significant pain. Abdominal x-ray has been completed without obvious pathology. Fecal pancreatic elastase normal. Continues with diarrhea as per above. At this time I feel she would be best served with a CT of the abdomen and pelvis to ensure no significant pathology, given the severity of her pain on exam. Colonoscopy is up-to-date in 2015 and not due again until 2025. Return for follow-up in.

## 2017-01-22 ENCOUNTER — Encounter: Payer: Self-pay | Admitting: Gastroenterology

## 2017-01-23 ENCOUNTER — Ambulatory Visit (HOSPITAL_COMMUNITY): Payer: Medicare Other

## 2017-01-23 NOTE — Progress Notes (Signed)
cc'ed to pcp °

## 2017-02-04 ENCOUNTER — Ambulatory Visit (HOSPITAL_COMMUNITY): Payer: Medicare Other

## 2017-02-17 ENCOUNTER — Ambulatory Visit (HOSPITAL_COMMUNITY)
Admission: RE | Admit: 2017-02-17 | Discharge: 2017-02-17 | Disposition: A | Discharge status: Home / Self Care | Payer: Medicare Other | Source: Ambulatory Visit | Attending: Nurse Practitioner | Admitting: Nurse Practitioner

## 2017-02-17 DIAGNOSIS — I700 Atherosclerosis of aorta: Secondary | ICD-10-CM | POA: Diagnosis not present

## 2017-02-17 DIAGNOSIS — R1030 Lower abdominal pain, unspecified: Secondary | ICD-10-CM | POA: Diagnosis not present

## 2017-02-17 DIAGNOSIS — R197 Diarrhea, unspecified: Secondary | ICD-10-CM | POA: Insufficient documentation

## 2017-02-17 DIAGNOSIS — R109 Unspecified abdominal pain: Secondary | ICD-10-CM | POA: Diagnosis not present

## 2017-02-17 LAB — POCT I-STAT CREATININE: CREATININE: 0.7 mg/dL (ref 0.44–1.00)

## 2017-02-17 MED ORDER — IOPAMIDOL (ISOVUE-300) INJECTION 61%
100.0000 mL | Freq: Once | INTRAVENOUS | Status: AC | PRN
  Administered 2017-02-17: 100 mL via INTRAVENOUS

## 2017-02-24 NOTE — Progress Notes (Signed)
Called, phone busy. Mailing a letter with results.

## 2017-02-26 ENCOUNTER — Ambulatory Visit: Payer: Medicare Other | Admitting: Gastroenterology

## 2017-03-05 ENCOUNTER — Ambulatory Visit (INDEPENDENT_AMBULATORY_CARE_PROVIDER_SITE_OTHER): Payer: Medicare Other | Admitting: Gastroenterology

## 2017-03-05 ENCOUNTER — Encounter: Payer: Self-pay | Admitting: Gastroenterology

## 2017-03-05 DIAGNOSIS — K9089 Other intestinal malabsorption: Secondary | ICD-10-CM

## 2017-03-05 MED ORDER — DICYCLOMINE HCL 10 MG PO CAPS: ORAL_CAPSULE | ORAL | 11 refills | Status: DC

## 2017-03-05 NOTE — Progress Notes (Signed)
cc'ed to pcp °

## 2017-03-05 NOTE — Assessment & Plan Note (Signed)
SYMPTOMS NOT IDEALLY CONTROLLED. [facility]STR[facility] HELPED SOME.  DRINK WATER TO KEEP YOUR URINE LIGHT YELLOW. DO NOT DRINK JUICE OR FRUIT PUNCH.  1.STRESSED NEED TO FOLLOW A LOW FAT DIET. MEATS SHOULD BE BAKED, BROILED, OR BOILED. AVOID FRIED FOODS.  HANDOUT GIVEN.  2. CHEW ONE TUMS WITH MEALS UP TO FOUR TIMES A  DAY TO PREVENT DIARRHEA.  3. START BENTYL 10 MG 30 MINUTES PRIOR TO BREAKFAST AND LUNCH. IT MAY CAUSE DRY MOUTH/EYES, DROWSINESS, DIFFICULTY URINATING, OR BLURRY VISION.  4. BEFORE INTERCOURSE TAKE 2 BENTYL.  FOLLOW UP IN 3 MOS.

## 2017-03-05 NOTE — Progress Notes (Addendum)
   Subjective:    Patient ID: Michelle Morrison, female    DOB: 12-25-1960, 56 y.o.   MRN: [id number]  [name],TESFAYE, MD  HPI Concerned due to stool on panties and watery stools. May accidents: 2-3 times a a week. Milk: not much. CHEESE: NOT THAT OFTEN: < 2-3X/WEEK. ICE CREAM; RARE. BREAKFAST YESTERDAY: NONE LUNCH: POTATOES/RICE/FRIED CHICKEN-?GRAVY/GREEN BEANS/CORNBREAD DINNER: GRAPES/[place] & CHEESE/GREEN BEANS.-NOT MUCH. DRINKS: JUICE (FRUIT PUNCH, OJ, SPRITE, COKE). QUESTRAN HELPED A LITTLE. NO TUMS OR COLESTID.  LAST NL FORMED BM: THIS AM(X2). BOWEL HAVE BEEN IRREGULAR FOR OVER A YEAR. MAY HAVE ABDOMINAL PAIN(TWINGE, @NAVEL  AND IN LOWER PARTS, EVER NOW AND THEN)PAIN AND THEN HAS A BM. PAIN GETS A LITTLE BETTER AFTER BM. CONCERNED ABOUT LOSING CONTROL OF HER BOWELS DURING INTERCOURSE.  PT DENIES FEVER, CHILLS, HEMATOCHEZIA, HEMATEMESIS, nausea, vomiting, melena, CHEST PAIN, SHORTNESS OF BREATH, CHANGE IN BOWEL IN HABITS, constipation,  problems swallowing, OR heartburn or indigestion.  Past Medical History:  Diagnosis Date  . Diabetes mellitus without complication ([place])   . Diarrhea   . Herpes simplex infection   . Hyperlipidemia   . Hypertension     Past Surgical History:  Procedure Laterality Date  . CESAREAN SECTION    . CHOLECYSTECTOMY    . COLONOSCOPY N/A 08/26/2014   Procedure: COLONOSCOPY;  Surgeon: [name], MD;  Location: [facility];  Service: Endoscopy;  Laterality: N/A;  9:30 AM - moved to 8:30 - Ginger to notify pt   No Known Allergies  Current Outpatient Prescriptions  Medication Sig Dispense Refill  . amLODipine (NORVASC) 10 MG tablet Take 10 mg by mouth daily.    . hydrochlorothiazide (MICROZIDE) 12.5 MG capsule Take 12.5 mg by mouth daily.    Marland Kitchen lisinopril (PRINIVIL,ZESTRIL) 40 MG tablet Take 40 mg by mouth daily.    . simvastatin (ZOCOR) 40 MG tablet Take 40 mg by mouth daily.    . TRADJENTA 5 MG TABS tablet Take 5 mg by mouth daily.     .       Review  of Systems PER HPI OTHERWISE ALL SYSTEMS ARE NEGATIVE.    Objective:   Physical Exam  Constitutional: She is oriented to person, place, and time. She appears well-developed and well-nourished. No distress.  HENT:  Head: Normocephalic and atraumatic.  Mouth/Throat: Oropharynx is clear and moist. No oropharyngeal exudate.  Eyes: Pupils are equal, round, and reactive to light. No scleral icterus.  Neck: Normal range of motion. Neck supple.  Cardiovascular: Normal rate, regular rhythm and normal heart sounds.   Pulmonary/Chest: Effort normal and breath sounds normal. No respiratory distress.  Abdominal: Soft. Bowel sounds are normal. She exhibits no distension. There is tenderness. There is no rebound and no guarding.  MILD TTP IN THE EPIGASTRIUM, MILD TTP IN THE EPIGASTRIUM & BUQS.  Musculoskeletal: She exhibits no edema.  Lymphadenopathy:    She has no cervical adenopathy.  Neurological: She is alert and oriented to person, place, and time.  NO FOCAL DEFICITS  Psychiatric: She has a normal mood and affect.  Vitals reviewed.     Assessment & Plan:

## 2017-03-05 NOTE — Progress Notes (Signed)
ON RECALL  °

## 2017-03-05 NOTE — Patient Instructions (Addendum)
DRINK WATER TO KEEP YOUR URINE LIGHT YELLOW. DO NOT DRINK JUICE OR FRUIT PUNCH.  TO PREVENT DIARRHEA:  1.FOLLOW A LOW FAT DIET. MEATS SHOULD BE BAKED, BROILED, OR BOILED. AVOID FRIED FOODS. SEE INFO BELOW.  2. CHEW ONE TUMS WITH MEALS UP TO FOUR TIMES A  DAY TO PREVENT DIARRHEA.  3. START BENTYL 10 MG 30 MINUTES PRIOR TO BREAKFAST AND LUNCH. IT MAY CAUSE DRY MOUTH/EYES, DROWSINESS, DIFFICULTY URINATING, OR BLURRY VISION.  4. BEFORE INTERCOURSE TAKE 2 BENTYL.  FOLLOW UP IN 3 MOS.   Low-Fat Diet BREADS, CEREALS, PASTA, RICE, DRIED PEAS, AND BEANS These products are high in carbohydrates and most are low in fat. Therefore, they can be increased in the diet as substitutes for fatty foods. They too, however, contain calories and should not be eaten in excess. Cereals can be eaten for snacks as well as for breakfast.   FRUITS AND VEGETABLES It is good to eat fruits and vegetables. Besides being sources of fiber, both are rich in vitamins and some minerals. They help you get the daily allowances of these nutrients. Fruits and vegetables can be used for snacks and desserts.  MEATS Limit lean meat, chicken, [place], and fish to no more than 6 ounces per day. Beef, Pork, and Lamb Use lean cuts of beef, pork, and lamb. Lean cuts include:  Extra-lean ground beef.  Arm roast.  Sirloin tip.  Center-cut ham.  Round steak.  Loin chops.  Rump roast.  Tenderloin.  Trim all fat off the outside of meats before cooking. It is not necessary to severely decrease the intake of red meat, but lean choices should be made. Lean meat is rich in protein and contains a highly absorbable form of iron. Premenopausal women, in particular, should avoid reducing lean red meat because this could increase the risk for low red blood cells (iron-deficiency anemia).  Chicken and [place] These are good sources of protein. The fat of poultry can be reduced by removing the skin and underlying fat layers before cooking. Chicken  and [place] can be substituted for lean red meat in the diet. Poultry should not be fried or covered with high-fat sauces. Fish and Shellfish Fish is a good source of protein. Shellfish contain cholesterol, but they usually are low in saturated fatty acids. The preparation of fish is important. Like chicken and [place], they should not be fried or covered with high-fat sauces. EGGS Egg whites contain no fat or cholesterol. They can be eaten often. Try 1 to 2 egg whites instead of whole eggs in recipes or use egg substitutes that do not contain yolk. MILK AND DAIRY PRODUCTS Use skim or 1% milk instead of 2% or whole milk. Decrease whole milk, natural, and processed cheeses. Use nonfat or low-fat (2%) cottage cheese or low-fat cheeses made from vegetable oils. Choose nonfat or low-fat (1 to 2%) yogurt. Experiment with evaporated skim milk in recipes that call for heavy cream. Substitute low-fat yogurt or low-fat cottage cheese for sour cream in dips and salad dressings. Have at least 2 servings of low-fat dairy products, such as 2 glasses of skim (or 1%) milk each day to help get your daily calcium intake. FATS AND OILS Reduce the total intake of fats, especially saturated fat. Butterfat, lard, and beef fats are high in saturated fat and cholesterol. These should be avoided as much as possible. Vegetable fats do not contain cholesterol, but certain vegetable fats, such as coconut oil, palm oil, and palm kernel oil are very high  in saturated fats. These should be limited. These fats are often used in bakery goods, processed foods, popcorn, oils, and nondairy creamers. Vegetable shortenings and some peanut butters contain hydrogenated oils, which are also saturated fats. Read the labels on these foods and check for saturated vegetable oils. Unsaturated vegetable oils and fats do not raise blood cholesterol. However, they should be limited because they are fats and are high in calories. Total fat should still be  limited to 30% of your daily caloric intake. Desirable liquid vegetable oils are corn oil, cottonseed oil, olive oil, canola oil, safflower oil, soybean oil, and sunflower oil. Peanut oil is not as good, but small amounts are acceptable. Buy a heart-healthy tub margarine that has no partially hydrogenated oils in the ingredients. Mayonnaise and salad dressings often are made from unsaturated fats, but they should also be limited because of their high calorie and fat content. Seeds, nuts, peanut butter, olives, and avocados are high in fat, but the fat is mainly the unsaturated type. These foods should be limited mainly to avoid excess calories and fat. OTHER EATING TIPS Snacks  Most sweets should be limited as snacks. They tend to be rich in calories and fats, and their caloric content outweighs their nutritional value. Some good choices in snacks are graham crackers, melba toast, soda crackers, bagels (no egg), English muffins, fruits, and vegetables. These snacks are preferable to snack crackers, [place] fries, TORTILLA CHIPS, and POTATO chips. Popcorn should be air-popped or cooked in small amounts of liquid vegetable oil. Desserts Eat fruit, low-fat yogurt, and fruit ices instead of pastries, cake, and cookies. Sherbet, angel food cake, gelatin dessert, frozen low-fat yogurt, or other frozen products that do not contain saturated fat (pure fruit juice bars, frozen ice pops) are also acceptable.  COOKING METHODS Choose those methods that use little or no fat. They include: Poaching.  Braising.  Steaming.  Grilling.  Baking.  Stir-frying.  Broiling.  Microwaving.  Foods can be cooked in a nonstick pan without added fat, or use a nonfat cooking spray in regular cookware. Limit fried foods and avoid frying in saturated fat. Add moisture to lean meats by using water, broth, cooking wines, and other nonfat or low-fat sauces along with the cooking methods mentioned above. Soups and stews should be  chilled after cooking. The fat that forms on top after a few hours in the refrigerator should be skimmed off. When preparing meals, avoid using excess salt. Salt can contribute to raising blood pressure in some people.  EATING AWAY FROM HOME Order entres, potatoes, and vegetables without sauces or butter. When meat exceeds the size of a deck of cards (3 to 4 ounces), the rest can be taken home for another meal. Choose vegetable or fruit salads and ask for low-calorie salad dressings to be served on the side. Use dressings sparingly. Limit high-fat toppings, such as bacon, crumbled eggs, cheese, sunflower seeds, and olives. Ask for heart-healthy tub margarine instead of butter.

## 2017-03-20 NOTE — Progress Notes (Signed)
REVIEWED.  

## 2017-04-28 ENCOUNTER — Encounter: Payer: Self-pay | Admitting: Gastroenterology

## 2017-05-01 ENCOUNTER — Encounter: Payer: Self-pay | Admitting: Obstetrics & Gynecology

## 2017-05-01 ENCOUNTER — Other Ambulatory Visit (HOSPITAL_COMMUNITY)
Admission: RE | Admit: 2017-05-01 | Discharge: 2017-05-01 | Disposition: A | Discharge status: Home / Self Care | Payer: Medicare Other | Source: Ambulatory Visit | Attending: Obstetrics & Gynecology | Admitting: Obstetrics & Gynecology

## 2017-05-01 ENCOUNTER — Ambulatory Visit (INDEPENDENT_AMBULATORY_CARE_PROVIDER_SITE_OTHER): Payer: Medicare Other | Admitting: Obstetrics & Gynecology

## 2017-05-01 VITALS — BP 160/110 | HR 52 | Ht 61.0 in | Wt 167.0 lb

## 2017-05-01 DIAGNOSIS — Z1212 Encounter for screening for malignant neoplasm of rectum: Secondary | ICD-10-CM | POA: Insufficient documentation

## 2017-05-01 DIAGNOSIS — Z01419 Encounter for gynecological examination (general) (routine) without abnormal findings: Secondary | ICD-10-CM | POA: Insufficient documentation

## 2017-05-01 DIAGNOSIS — Z0000 Encounter for general adult medical examination without abnormal findings: Secondary | ICD-10-CM | POA: Diagnosis not present

## 2017-05-01 DIAGNOSIS — Z1211 Encounter for screening for malignant neoplasm of colon: Secondary | ICD-10-CM | POA: Diagnosis not present

## 2017-05-01 LAB — HEMOCCULT GUIAC POC 1CARD (OFFICE): FECAL OCCULT BLD: NEGATIVE

## 2017-05-01 NOTE — Progress Notes (Signed)
Subjective:     Michelle Morrison is a 56 y.o. female here for a routine exam.  Patient's last menstrual period was 05/23/2007. [id number] Birth Control Method:  menopausal Menstrual Calendar(currently): amenorrheic  Current complaints: none.   Current acute medical issues:  none   Recent Gynecologic History Patient's last menstrual period was 05/23/2007. Last Pap: 04/30/2016,  ASCUS + HPV Last mammogram: 11/06/2017,  normal  Past Medical History:  Diagnosis Date  . Diabetes mellitus without complication ([place])   . Diarrhea   . Herpes simplex infection   . Hyperlipidemia   . Hypertension     Past Surgical History:  Procedure Laterality Date  . CESAREAN SECTION    . CHOLECYSTECTOMY    . COLONOSCOPY N/A 08/26/2014   Procedure: COLONOSCOPY;  Surgeon: [name], MD;  Location: [facility];  Service: Endoscopy;  Laterality: N/A;  9:30 AM - moved to 8:30 - Ginger to notify pt    OB History    Gravida Para Term Preterm AB Living   2 2 1 1   2    SAB TAB Ectopic Multiple Live Births           2      Social History   Social History  . Marital status: Single    Spouse name: N/A  . Number of children: N/A  . Years of education: N/A   Social History Main Topics  . Smoking status: Former Smoker    Types: Cigarettes  . Smokeless tobacco: Never Used  . Alcohol use No  . Drug use: No  . Sexual activity: Not Currently    Birth control/ protection: Post-menopausal   Other Topics Concern  . None   Social History Narrative  . None    Family History  Problem Relation Age of Onset  . Hypertension Mother   . Diabetes Mother   . Colon cancer Neg Hx      Current Outpatient Prescriptions:  .  amLODipine (NORVASC) 10 MG tablet, Take 10 mg by mouth daily., Disp: , Rfl:  .  dicyclomine (BENTYL) 10 MG capsule, 1 PO 30 MINUTES PRIOR TO BREAKFAST AND LUNCH AND 2 IF NEEDED, Disp: 90 capsule, Rfl: 11 .  hydrochlorothiazide (MICROZIDE) 12.5 MG capsule, Take 12.5 mg by mouth daily.,  Disp: , Rfl:  .  lisinopril (PRINIVIL,ZESTRIL) 40 MG tablet, Take 40 mg by mouth daily., Disp: , Rfl:  .  simvastatin (ZOCOR) 40 MG tablet, Take 40 mg by mouth daily., Disp: , Rfl:  .  TRADJENTA 5 MG TABS tablet, Take 5 mg by mouth daily. , Disp: , Rfl:  .  cholestyramine (QUESTRAN) 4 g packet, Take 1 packet (4 g total) by mouth 3 (three) times daily with meals. (Patient not taking: Reported on 03/05/2017), Disp: 60 each, Rfl: 12  Review of Systems  Review of Systems  Constitutional: Negative for fever, chills, weight loss, malaise/fatigue and diaphoresis.  HENT: Negative for hearing loss, ear pain, nosebleeds, congestion, sore throat, neck pain, tinnitus and ear discharge.   Eyes: Negative for blurred vision, double vision, photophobia, pain, discharge and redness.  Respiratory: Negative for cough, hemoptysis, sputum production, shortness of breath, wheezing and stridor.   Cardiovascular: Negative for chest pain, palpitations, orthopnea, claudication, leg swelling and PND.  Gastrointestinal: negative for abdominal pain. Negative for heartburn, nausea, vomiting, diarrhea, constipation, blood in stool and melena.  Genitourinary: Negative for dysuria, urgency, frequency, hematuria and flank pain.  Musculoskeletal: Negative for myalgias, back pain, joint pain and falls.  Skin:  Negative for itching and rash.  Neurological: Negative for dizziness, tingling, tremors, sensory change, speech change, focal weakness, seizures, loss of consciousness, weakness and headaches.  Endo/Heme/Allergies: Negative for environmental allergies and polydipsia. Does not bruise/bleed easily.  Psychiatric/Behavioral: Negative for depression, suicidal ideas, hallucinations, memory loss and substance abuse. The patient is not nervous/anxious and does not have insomnia.        Objective:  Blood pressure (!) 160/110, pulse (!) 52, height 5\' 1"  (1.549 m), weight 167 lb (75.8 kg), last menstrual period 05/23/2007.    Physical Exam  Vitals reviewed. Constitutional: She is oriented to person, place, and time. She appears well-developed and well-nourished.  HENT:  Head: Normocephalic and atraumatic.        Right Ear: External ear normal.  Left Ear: External ear normal.  Nose: Nose normal.  Mouth/Throat: Oropharynx is clear and moist.  Eyes: Conjunctivae and EOM are normal. Pupils are equal, round, and reactive to light. Right eye exhibits no discharge. Left eye exhibits no discharge. No scleral icterus.  Neck: Normal range of motion. Neck supple. No tracheal deviation present. No thyromegaly present.  Cardiovascular: Normal rate, regular rhythm, normal heart sounds and intact distal pulses.  Exam reveals no gallop and no friction rub.   No murmur heard. Respiratory: Effort normal and breath sounds normal. No respiratory distress. She has no wheezes. She has no rales. She exhibits no tenderness.  GI: Soft. Bowel sounds are normal. She exhibits no distension and no mass. There is no tenderness. There is no rebound and no guarding.  Genitourinary:  Breasts no masses skin changes or nipple changes bilaterally      Vulva is normal without lesions Vagina is pink moist without discharge Cervix normal in appearance and pap is done Uterus is normal size shape and contour Adnexa is negative with normal sized ovaries  {Rectal    hemoccult negative, normal tone, no masses  Musculoskeletal: Normal range of motion. She exhibits no edema and no tenderness.  Neurological: She is alert and oriented to person, place, and time. She has normal reflexes. She displays normal reflexes. No cranial nerve deficit. She exhibits normal muscle tone. Coordination normal.  Skin: Skin is warm and dry. No rash noted. No erythema. No pallor.  Psychiatric: She has a normal mood and affect. Her behavior is normal. Judgment and thought content normal.       Medications Ordered at today's visit: No orders of the defined types were placed  in this encounter.   Other orders placed at today's visit: Orders Placed This Encounter  Procedures  . POCT occult blood stool      Assessment:    Healthy female exam.    Plan:    Mammogram ordered. Follow up in: 1 year. Pap pending     Return in about 1 year (around 05/01/2018) for yearly, with Dr Elonda Husky.

## 2017-05-06 LAB — CYTOLOGY - PAP
DIAGNOSIS: NEGATIVE
HPV (WINDOPATH): DETECTED — AB

## 2017-05-13 DIAGNOSIS — E119 Type 2 diabetes mellitus without complications: Secondary | ICD-10-CM | POA: Diagnosis not present

## 2017-06-09 DIAGNOSIS — E1165 Type 2 diabetes mellitus with hyperglycemia: Secondary | ICD-10-CM | POA: Diagnosis not present

## 2017-06-09 DIAGNOSIS — I10 Essential (primary) hypertension: Secondary | ICD-10-CM | POA: Diagnosis not present

## 2017-06-09 DIAGNOSIS — E669 Obesity, unspecified: Secondary | ICD-10-CM | POA: Diagnosis not present

## 2017-06-09 DIAGNOSIS — Z0000 Encounter for general adult medical examination without abnormal findings: Secondary | ICD-10-CM | POA: Diagnosis not present

## 2017-06-09 DIAGNOSIS — E785 Hyperlipidemia, unspecified: Secondary | ICD-10-CM | POA: Diagnosis not present

## 2017-06-09 DIAGNOSIS — Z6828 Body mass index (BMI) 28.0-28.9, adult: Secondary | ICD-10-CM | POA: Diagnosis not present

## 2017-06-09 DIAGNOSIS — Z1389 Encounter for screening for other disorder: Secondary | ICD-10-CM | POA: Diagnosis not present

## 2017-09-08 DIAGNOSIS — I10 Essential (primary) hypertension: Secondary | ICD-10-CM | POA: Diagnosis not present

## 2017-09-08 DIAGNOSIS — E1165 Type 2 diabetes mellitus with hyperglycemia: Secondary | ICD-10-CM | POA: Diagnosis not present

## 2017-09-08 DIAGNOSIS — J0100 Acute maxillary sinusitis, unspecified: Secondary | ICD-10-CM | POA: Diagnosis not present

## 2017-09-22 ENCOUNTER — Other Ambulatory Visit (HOSPITAL_COMMUNITY): Payer: Self-pay | Admitting: Internal Medicine

## 2017-09-22 DIAGNOSIS — Z1231 Encounter for screening mammogram for malignant neoplasm of breast: Secondary | ICD-10-CM

## 2017-11-10 ENCOUNTER — Ambulatory Visit (HOSPITAL_COMMUNITY): Payer: Medicare Other

## 2017-11-12 ENCOUNTER — Ambulatory Visit (HOSPITAL_COMMUNITY): Payer: Medicare Other

## 2017-11-17 ENCOUNTER — Ambulatory Visit (HOSPITAL_COMMUNITY): Payer: Medicare Other

## 2017-11-19 ENCOUNTER — Ambulatory Visit (HOSPITAL_COMMUNITY)
Admission: RE | Admit: 2017-11-19 | Discharge: 2017-11-19 | Disposition: A | Discharge status: Home / Self Care | Payer: Medicare Other | Source: Ambulatory Visit | Attending: Internal Medicine | Admitting: Internal Medicine

## 2017-11-19 DIAGNOSIS — Z1231 Encounter for screening mammogram for malignant neoplasm of breast: Secondary | ICD-10-CM | POA: Insufficient documentation

## 2017-12-08 DIAGNOSIS — E11610 Type 2 diabetes mellitus with diabetic neuropathic arthropathy: Secondary | ICD-10-CM | POA: Diagnosis not present

## 2017-12-08 DIAGNOSIS — E785 Hyperlipidemia, unspecified: Secondary | ICD-10-CM | POA: Diagnosis not present

## 2017-12-08 DIAGNOSIS — Z6828 Body mass index (BMI) 28.0-28.9, adult: Secondary | ICD-10-CM | POA: Diagnosis not present

## 2017-12-08 DIAGNOSIS — E1165 Type 2 diabetes mellitus with hyperglycemia: Secondary | ICD-10-CM | POA: Diagnosis not present

## 2017-12-08 DIAGNOSIS — Z23 Encounter for immunization: Secondary | ICD-10-CM | POA: Diagnosis not present

## 2017-12-08 DIAGNOSIS — I10 Essential (primary) hypertension: Secondary | ICD-10-CM | POA: Diagnosis not present

## 2018-03-09 DIAGNOSIS — I10 Essential (primary) hypertension: Secondary | ICD-10-CM | POA: Diagnosis not present

## 2018-03-09 DIAGNOSIS — E785 Hyperlipidemia, unspecified: Secondary | ICD-10-CM | POA: Diagnosis not present

## 2018-03-09 DIAGNOSIS — E1165 Type 2 diabetes mellitus with hyperglycemia: Secondary | ICD-10-CM | POA: Diagnosis not present

## 2018-06-09 DIAGNOSIS — E669 Obesity, unspecified: Secondary | ICD-10-CM | POA: Diagnosis not present

## 2018-06-09 DIAGNOSIS — E785 Hyperlipidemia, unspecified: Secondary | ICD-10-CM | POA: Diagnosis not present

## 2018-06-09 DIAGNOSIS — I10 Essential (primary) hypertension: Secondary | ICD-10-CM | POA: Diagnosis not present

## 2018-06-09 DIAGNOSIS — Z1389 Encounter for screening for other disorder: Secondary | ICD-10-CM | POA: Diagnosis not present

## 2018-06-09 DIAGNOSIS — E1165 Type 2 diabetes mellitus with hyperglycemia: Secondary | ICD-10-CM | POA: Diagnosis not present

## 2018-06-09 DIAGNOSIS — Z6830 Body mass index (BMI) 30.0-30.9, adult: Secondary | ICD-10-CM | POA: Diagnosis not present

## 2018-06-09 DIAGNOSIS — Z1331 Encounter for screening for depression: Secondary | ICD-10-CM | POA: Diagnosis not present

## 2018-06-09 DIAGNOSIS — Z0000 Encounter for general adult medical examination without abnormal findings: Secondary | ICD-10-CM | POA: Diagnosis not present

## 2018-09-09 DIAGNOSIS — Z23 Encounter for immunization: Secondary | ICD-10-CM | POA: Diagnosis not present

## 2018-09-09 DIAGNOSIS — E785 Hyperlipidemia, unspecified: Secondary | ICD-10-CM | POA: Diagnosis not present

## 2018-09-09 DIAGNOSIS — E1165 Type 2 diabetes mellitus with hyperglycemia: Secondary | ICD-10-CM | POA: Diagnosis not present

## 2018-09-09 DIAGNOSIS — I10 Essential (primary) hypertension: Secondary | ICD-10-CM | POA: Diagnosis not present

## 2018-09-10 ENCOUNTER — Encounter

## 2018-09-10 ENCOUNTER — Ambulatory Visit: Payer: Medicare Other | Admitting: Nurse Practitioner

## 2018-10-02 DIAGNOSIS — I10 Essential (primary) hypertension: Secondary | ICD-10-CM | POA: Diagnosis not present

## 2018-10-02 DIAGNOSIS — E1165 Type 2 diabetes mellitus with hyperglycemia: Secondary | ICD-10-CM | POA: Diagnosis not present

## 2018-10-02 DIAGNOSIS — H6091 Unspecified otitis externa, right ear: Secondary | ICD-10-CM | POA: Diagnosis not present

## 2018-10-26 ENCOUNTER — Other Ambulatory Visit (HOSPITAL_COMMUNITY): Payer: Self-pay | Admitting: Internal Medicine

## 2018-10-26 DIAGNOSIS — Z1231 Encounter for screening mammogram for malignant neoplasm of breast: Secondary | ICD-10-CM

## 2018-11-23 ENCOUNTER — Encounter (HOSPITAL_COMMUNITY): Payer: Self-pay

## 2018-11-23 ENCOUNTER — Ambulatory Visit (HOSPITAL_COMMUNITY)
Admission: RE | Admit: 2018-11-23 | Discharge: 2018-11-23 | Disposition: A | Discharge status: Home / Self Care | Payer: Medicare Other | Source: Ambulatory Visit | Attending: Internal Medicine | Admitting: Internal Medicine

## 2018-11-23 DIAGNOSIS — Z1231 Encounter for screening mammogram for malignant neoplasm of breast: Secondary | ICD-10-CM

## 2018-12-10 DIAGNOSIS — E785 Hyperlipidemia, unspecified: Secondary | ICD-10-CM | POA: Diagnosis not present

## 2018-12-10 DIAGNOSIS — E1165 Type 2 diabetes mellitus with hyperglycemia: Secondary | ICD-10-CM | POA: Diagnosis not present

## 2018-12-10 DIAGNOSIS — I10 Essential (primary) hypertension: Secondary | ICD-10-CM | POA: Diagnosis not present

## 2018-12-18 ENCOUNTER — Encounter: Payer: Self-pay | Admitting: Obstetrics & Gynecology

## 2018-12-18 ENCOUNTER — Other Ambulatory Visit (HOSPITAL_COMMUNITY)
Admission: RE | Admit: 2018-12-18 | Discharge: 2018-12-18 | Disposition: A | Discharge status: Home / Self Care | Payer: Medicare Other | Source: Ambulatory Visit | Attending: Obstetrics & Gynecology | Admitting: Obstetrics & Gynecology

## 2018-12-18 ENCOUNTER — Ambulatory Visit (INDEPENDENT_AMBULATORY_CARE_PROVIDER_SITE_OTHER): Payer: Medicare Other | Admitting: Obstetrics & Gynecology

## 2018-12-18 ENCOUNTER — Other Ambulatory Visit: Payer: Self-pay

## 2018-12-18 ENCOUNTER — Encounter (INDEPENDENT_AMBULATORY_CARE_PROVIDER_SITE_OTHER): Payer: Self-pay

## 2018-12-18 VITALS — BP 160/69 | HR 56 | Ht 62.0 in | Wt 174.0 lb

## 2018-12-18 DIAGNOSIS — Z124 Encounter for screening for malignant neoplasm of cervix: Secondary | ICD-10-CM | POA: Insufficient documentation

## 2018-12-18 DIAGNOSIS — Z8619 Personal history of other infectious and parasitic diseases: Secondary | ICD-10-CM

## 2018-12-18 NOTE — Progress Notes (Signed)
Chief Complaint  Patient presents with  . repeat pap      58 y.o. [id number] Patient's last menstrual period was 05/23/2007. The current method of family planning is post menopausal status.  Outpatient Encounter Medications as of 12/18/2018  Medication Sig Note  . amLODipine (NORVASC) 10 MG tablet Take 10 mg by mouth daily.   Marland Kitchen dicyclomine (BENTYL) 10 MG capsule 1 PO 30 MINUTES PRIOR TO BREAKFAST AND LUNCH AND 2 IF NEEDED   . hydrochlorothiazide (MICROZIDE) 12.5 MG capsule Take 12.5 mg by mouth daily.   Marland Kitchen lisinopril (PRINIVIL,ZESTRIL) 40 MG tablet Take 40 mg by mouth daily.   . simvastatin (ZOCOR) 40 MG tablet Take 40 mg by mouth daily.   . TRADJENTA 5 MG TABS tablet Take 5 mg by mouth daily.  09/24/2016: Received from: External Pharmacy  . [DISCONTINUED] cholestyramine (QUESTRAN) 4 g packet Take 1 packet (4 g total) by mouth 3 (three) times daily with meals. (Patient not taking: Reported on 03/05/2017)    No facility-administered encounter medications on file as of 12/18/2018.     Subjective Pt is here for follow up Pap 6/18 Negative cytology + HPV No complaints today Past Medical History:  Diagnosis Date  . Diabetes mellitus without complication ([place])   . Diarrhea   . Herpes simplex infection   . Hyperlipidemia   . Hypertension     Past Surgical History:  Procedure Laterality Date  . CESAREAN SECTION    . CHOLECYSTECTOMY    . COLONOSCOPY N/A 08/26/2014   Procedure: COLONOSCOPY;  Surgeon: [name], MD;  [facility]: [facility];  Service: Endoscopy;  Laterality: N/A;  9:30 AM - moved to 8:30 - Ginger to notify pt    OB History    Gravida  2   Para  2   Term  1   Preterm  1   AB      Living  2     SAB      TAB      Ectopic      Multiple      Live Births  2           No Known Allergies  Social History   Socioeconomic History  . Marital status: Single    Spouse name: Not on file  . Number of children: Not on file  . Years of  education: Not on file  . Highest education level: Not on file  Occupational History  . Not on file  Social Needs  . Financial resource strain: Not on file  . Food insecurity:    Worry: Not on file    Inability: Not on file  . Transportation needs:    Medical: Not on file    Non-medical: Not on file  Tobacco Use  . Smoking status: Former Smoker    Types: Cigarettes  . Smokeless tobacco: Never Used  Substance and Sexual Activity  . Alcohol use: No    Alcohol/week: 0.0 standard drinks  . Drug use: No  . Sexual activity: Not Currently    Birth control/protection: Post-menopausal  Lifestyle  . Physical activity:    Days per week: Not on file    Minutes per session: Not on file  . Stress: Not on file  Relationships  . Social connections:    Talks on phone: Not on file    Gets together: Not on file    Attends religious service: Not on file    Active member of  club or organization: Not on file    Attends meetings of clubs or organizations: Not on file    Relationship status: Not on file  Other Topics Concern  . Not on file  Social History Narrative  . Not on file    Family History  Problem Relation Age of Onset  . Hypertension Mother   . Diabetes Mother   . Colon cancer Neg Hx     Medications:       Current Outpatient Medications:  .  amLODipine (NORVASC) 10 MG tablet, Take 10 mg by mouth daily., Disp: , Rfl:  .  dicyclomine (BENTYL) 10 MG capsule, 1 PO 30 MINUTES PRIOR TO BREAKFAST AND LUNCH AND 2 IF NEEDED, Disp: 90 capsule, Rfl: 11 .  hydrochlorothiazide (MICROZIDE) 12.5 MG capsule, Take 12.5 mg by mouth daily., Disp: , Rfl:  .  lisinopril (PRINIVIL,ZESTRIL) 40 MG tablet, Take 40 mg by mouth daily., Disp: , Rfl:  .  simvastatin (ZOCOR) 40 MG tablet, Take 40 mg by mouth daily., Disp: , Rfl:  .  TRADJENTA 5 MG TABS tablet, Take 5 mg by mouth daily. , Disp: , Rfl:   Objective Blood pressure (!) 160/69, pulse (!) 56, height 5\' 2"  (1.575 m), weight 174 lb (78.9 kg),  last menstrual period 05/23/2007.  General WDWN female NAD Vulva:  Normal no lesions or abnormalities Vagina:  normal mucosa, no discharge Cervix:  no bleeding following Pap, no cervical motion tenderness and no lesions Uterus:  normal size, contour, position, consistency, mobility, non-tender Adnexa: ovaries:present,  normal adnexa in size, nontender and no masses   Pertinent ROS No burning with urination, frequency or urgency No nausea, vomiting or diarrhea Nor fever chills or other constitutional symptoms   Labs or studies     Impression Diagnoses this Encounter::   ICD-10-CM   1. 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 Routine cervical smear Z12.4 Cytology - PAP    Established relevant diagnosis(es):   Plan/Recommendations: No orders of the defined types were placed in this encounter.   Labs or Scans Ordered: No orders of the defined types were placed in this encounter.   Management:: >repeat Pap is done  Follow up Return in about 1 year (around 12/19/2019), or if symptoms worsen or fail to improve.       All questions were answered.

## 2018-12-22 LAB — CYTOLOGY - PAP
ADEQUACY: ABSENT
Diagnosis: NEGATIVE
HPV: DETECTED — AB

## 2018-12-23 ENCOUNTER — Telehealth: Payer: Self-pay | Admitting: *Deleted

## 2018-12-23 NOTE — Telephone Encounter (Signed)
Patient informed Pap normal but HPV last 2 paps thus requiring colpo.  All questions answered and patient scheduled.

## 2019-01-05 ENCOUNTER — Ambulatory Visit (INDEPENDENT_AMBULATORY_CARE_PROVIDER_SITE_OTHER): Payer: Medicare Other | Admitting: Obstetrics & Gynecology

## 2019-01-05 VITALS — Ht 63.0 in | Wt 174.0 lb

## 2019-01-05 DIAGNOSIS — R87810 Cervical high risk human papillomavirus (HPV) DNA test positive: Secondary | ICD-10-CM | POA: Diagnosis not present

## 2019-01-05 NOTE — Progress Notes (Signed)
Pap test 2 consecutive years negative cytology with +HPV  Colposcopy Procedure Note:  Colposcopy Procedure Note  Indications: Pap smear 1 months ago showed: +HPV. The prior pap showed +HPV.  Prior cervical/vaginal disease: . Prior cervical treatment: .  Smoker:  No. New sexual partner:  no    History of abnormal Pap: yes  Procedure Details  The risks and benefits of the procedure and Written informed consent obtained.  Speculum placed in vagina and excellent visualization of cervix achieved, cervix swabbed x 3 with acetic acid solution.  Findings: Cervix: no visible lesions, no mosaicism, no punctation and no abnormal vasculature; SCJ visualized 360 degrees without lesions and no biopsies taken. Vaginal inspection: normal without visible lesions. Vulvar colposcopy: vulvar colposcopy not performed.  Specimens: none  Complications: none.  Plan: Follow up 1 year

## 2019-01-12 ENCOUNTER — Encounter: Payer: Self-pay | Admitting: Obstetrics & Gynecology

## 2019-03-11 DIAGNOSIS — E1165 Type 2 diabetes mellitus with hyperglycemia: Secondary | ICD-10-CM | POA: Diagnosis not present

## 2019-03-11 DIAGNOSIS — E785 Hyperlipidemia, unspecified: Secondary | ICD-10-CM | POA: Diagnosis not present

## 2019-03-11 DIAGNOSIS — I10 Essential (primary) hypertension: Secondary | ICD-10-CM | POA: Diagnosis not present

## 2019-05-17 DIAGNOSIS — E785 Hyperlipidemia, unspecified: Secondary | ICD-10-CM | POA: Diagnosis not present

## 2019-05-17 DIAGNOSIS — E1165 Type 2 diabetes mellitus with hyperglycemia: Secondary | ICD-10-CM | POA: Diagnosis not present

## 2019-05-17 DIAGNOSIS — Z1389 Encounter for screening for other disorder: Secondary | ICD-10-CM | POA: Diagnosis not present

## 2019-05-17 DIAGNOSIS — Z1331 Encounter for screening for depression: Secondary | ICD-10-CM | POA: Diagnosis not present

## 2019-05-17 DIAGNOSIS — I10 Essential (primary) hypertension: Secondary | ICD-10-CM | POA: Diagnosis not present

## 2019-05-19 DIAGNOSIS — Z6828 Body mass index (BMI) 28.0-28.9, adult: Secondary | ICD-10-CM | POA: Diagnosis not present

## 2019-05-19 DIAGNOSIS — I10 Essential (primary) hypertension: Secondary | ICD-10-CM | POA: Diagnosis not present

## 2019-05-19 DIAGNOSIS — E1165 Type 2 diabetes mellitus with hyperglycemia: Secondary | ICD-10-CM | POA: Diagnosis not present

## 2019-05-19 DIAGNOSIS — E785 Hyperlipidemia, unspecified: Secondary | ICD-10-CM | POA: Diagnosis not present

## 2019-05-19 DIAGNOSIS — Z0001 Encounter for general adult medical examination with abnormal findings: Secondary | ICD-10-CM | POA: Diagnosis not present

## 2019-06-16 DIAGNOSIS — E1165 Type 2 diabetes mellitus with hyperglycemia: Secondary | ICD-10-CM | POA: Diagnosis not present

## 2019-06-16 DIAGNOSIS — I10 Essential (primary) hypertension: Secondary | ICD-10-CM | POA: Diagnosis not present

## 2019-07-17 DIAGNOSIS — E1165 Type 2 diabetes mellitus with hyperglycemia: Secondary | ICD-10-CM | POA: Diagnosis not present

## 2019-07-17 DIAGNOSIS — I10 Essential (primary) hypertension: Secondary | ICD-10-CM | POA: Diagnosis not present

## 2019-08-13 ENCOUNTER — Other Ambulatory Visit: Payer: Self-pay

## 2019-08-13 DIAGNOSIS — Z20822 Contact with and (suspected) exposure to covid-19: Secondary | ICD-10-CM

## 2019-08-13 DIAGNOSIS — R6889 Other general symptoms and signs: Secondary | ICD-10-CM | POA: Diagnosis not present

## 2019-08-14 LAB — NOVEL CORONAVIRUS, NAA: SARS-CoV-2, NAA: NOT DETECTED

## 2019-08-17 DIAGNOSIS — E1165 Type 2 diabetes mellitus with hyperglycemia: Secondary | ICD-10-CM | POA: Diagnosis not present

## 2019-08-17 DIAGNOSIS — E785 Hyperlipidemia, unspecified: Secondary | ICD-10-CM | POA: Diagnosis not present

## 2019-09-16 DIAGNOSIS — I10 Essential (primary) hypertension: Secondary | ICD-10-CM | POA: Diagnosis not present

## 2019-09-16 DIAGNOSIS — E1165 Type 2 diabetes mellitus with hyperglycemia: Secondary | ICD-10-CM | POA: Diagnosis not present

## 2019-10-18 DIAGNOSIS — E785 Hyperlipidemia, unspecified: Secondary | ICD-10-CM | POA: Diagnosis not present

## 2019-10-18 DIAGNOSIS — I10 Essential (primary) hypertension: Secondary | ICD-10-CM | POA: Diagnosis not present

## 2019-10-18 DIAGNOSIS — E1165 Type 2 diabetes mellitus with hyperglycemia: Secondary | ICD-10-CM | POA: Diagnosis not present

## 2019-10-18 DIAGNOSIS — Z23 Encounter for immunization: Secondary | ICD-10-CM | POA: Diagnosis not present

## 2019-11-02 DIAGNOSIS — E113293 Type 2 diabetes mellitus with mild nonproliferative diabetic retinopathy without macular edema, bilateral: Secondary | ICD-10-CM | POA: Diagnosis not present

## 2019-11-10 ENCOUNTER — Other Ambulatory Visit (HOSPITAL_COMMUNITY): Payer: Self-pay | Admitting: Internal Medicine

## 2019-11-10 DIAGNOSIS — Z1231 Encounter for screening mammogram for malignant neoplasm of breast: Secondary | ICD-10-CM

## 2019-11-17 DIAGNOSIS — I10 Essential (primary) hypertension: Secondary | ICD-10-CM | POA: Diagnosis not present

## 2019-11-17 DIAGNOSIS — E1165 Type 2 diabetes mellitus with hyperglycemia: Secondary | ICD-10-CM | POA: Diagnosis not present

## 2019-11-25 ENCOUNTER — Other Ambulatory Visit: Payer: Self-pay

## 2019-11-25 ENCOUNTER — Ambulatory Visit (HOSPITAL_COMMUNITY)
Admission: RE | Admit: 2019-11-25 | Discharge: 2019-11-25 | Disposition: A | Discharge status: Home / Self Care | Payer: Medicare Other | Source: Ambulatory Visit | Attending: Internal Medicine | Admitting: Internal Medicine

## 2019-11-25 DIAGNOSIS — Z1231 Encounter for screening mammogram for malignant neoplasm of breast: Secondary | ICD-10-CM | POA: Insufficient documentation

## 2019-12-28 DIAGNOSIS — E1165 Type 2 diabetes mellitus with hyperglycemia: Secondary | ICD-10-CM | POA: Diagnosis not present

## 2019-12-28 DIAGNOSIS — Z1389 Encounter for screening for other disorder: Secondary | ICD-10-CM | POA: Diagnosis not present

## 2019-12-28 DIAGNOSIS — I10 Essential (primary) hypertension: Secondary | ICD-10-CM | POA: Diagnosis not present

## 2019-12-28 DIAGNOSIS — Z0001 Encounter for general adult medical examination with abnormal findings: Secondary | ICD-10-CM | POA: Diagnosis not present

## 2020-01-18 DIAGNOSIS — E1165 Type 2 diabetes mellitus with hyperglycemia: Secondary | ICD-10-CM | POA: Diagnosis not present

## 2020-01-18 DIAGNOSIS — I10 Essential (primary) hypertension: Secondary | ICD-10-CM | POA: Diagnosis not present

## 2020-01-18 DIAGNOSIS — E785 Hyperlipidemia, unspecified: Secondary | ICD-10-CM | POA: Diagnosis not present

## 2020-01-25 DIAGNOSIS — E1165 Type 2 diabetes mellitus with hyperglycemia: Secondary | ICD-10-CM | POA: Diagnosis not present

## 2020-01-25 DIAGNOSIS — I10 Essential (primary) hypertension: Secondary | ICD-10-CM | POA: Diagnosis not present

## 2020-02-17 ENCOUNTER — Ambulatory Visit: Payer: Medicare Other | Attending: Internal Medicine

## 2020-02-17 DIAGNOSIS — Z23 Encounter for immunization: Secondary | ICD-10-CM

## 2020-02-17 NOTE — Progress Notes (Signed)
   Covid-19 Vaccination Clinic  Name:  Michelle Morrison    MRN: [id number] DOB: 20-Jan-1961  02/17/2020  Michelle Morrison was observed post Covid-19 immunization for 15 minutes without incident. She was provided with Vaccine Information Sheet and instruction to access the V-Safe system.   Michelle Morrison was instructed to call 911 with any severe reactions post vaccine: Marland Kitchen Difficulty breathing  . Swelling of face and throat  . A fast heartbeat  . A bad rash all over body  . Dizziness and weakness   Immunizations Administered    Name Date Dose VIS Date Route   Moderna COVID-19 Vaccine 02/17/2020 10:29 AM 0.5 mL 10/26/2019 Intramuscular   Manufacturer: Moderna   Lot: [id number]   [id number]

## 2020-02-25 DIAGNOSIS — E1165 Type 2 diabetes mellitus with hyperglycemia: Secondary | ICD-10-CM | POA: Diagnosis not present

## 2020-02-25 DIAGNOSIS — I10 Essential (primary) hypertension: Secondary | ICD-10-CM | POA: Diagnosis not present

## 2020-03-16 ENCOUNTER — Ambulatory Visit: Payer: Medicare Other | Attending: Internal Medicine

## 2020-03-16 DIAGNOSIS — Z23 Encounter for immunization: Secondary | ICD-10-CM

## 2020-03-16 NOTE — Progress Notes (Signed)
   Covid-19 Vaccination Clinic  Name:  CHAKARA IRUEGAS    MRN: [id number] DOB: 1961-06-12  03/16/2020  Ms. Lazaro was observed post Covid-19 immunization for 15 minutes without incident. She was provided with Vaccine Information Sheet and instruction to access the V-Safe system.   Ms. Joffe was instructed to call 911 with any severe reactions post vaccine: Marland Kitchen Difficulty breathing  . Swelling of face and throat  . A fast heartbeat  . A bad rash all over body  . Dizziness and weakness   Immunizations Administered    Name Date Dose VIS Date Route   Moderna COVID-19 Vaccine 03/16/2020 10:02 AM 0.5 mL 10/2019 Intramuscular   Manufacturer: Moderna   Lot: [id number]   [id number]

## 2020-03-26 DIAGNOSIS — E1165 Type 2 diabetes mellitus with hyperglycemia: Secondary | ICD-10-CM | POA: Diagnosis not present

## 2020-03-26 DIAGNOSIS — I10 Essential (primary) hypertension: Secondary | ICD-10-CM | POA: Diagnosis not present

## 2020-04-13 ENCOUNTER — Encounter (HOSPITAL_COMMUNITY): Payer: Self-pay | Admitting: Emergency Medicine

## 2020-04-13 ENCOUNTER — Inpatient Hospital Stay (HOSPITAL_COMMUNITY)
Admission: EM | Admit: 2020-04-13 | Discharge: 2020-04-15 | DRG: 286 | Disposition: A | Discharge status: Home / Self Care | Payer: Medicare Other | Attending: Internal Medicine | Admitting: Internal Medicine

## 2020-04-13 ENCOUNTER — Emergency Department (HOSPITAL_COMMUNITY): Payer: Medicare Other

## 2020-04-13 ENCOUNTER — Observation Stay (HOSPITAL_BASED_OUTPATIENT_CLINIC_OR_DEPARTMENT_OTHER): Payer: Medicare Other

## 2020-04-13 ENCOUNTER — Other Ambulatory Visit: Payer: Self-pay

## 2020-04-13 DIAGNOSIS — I110 Hypertensive heart disease with heart failure: Principal | ICD-10-CM | POA: Diagnosis present

## 2020-04-13 DIAGNOSIS — I509 Heart failure, unspecified: Secondary | ICD-10-CM | POA: Diagnosis not present

## 2020-04-13 DIAGNOSIS — R092 Respiratory arrest: Secondary | ICD-10-CM | POA: Diagnosis not present

## 2020-04-13 DIAGNOSIS — I428 Other cardiomyopathies: Secondary | ICD-10-CM | POA: Diagnosis present

## 2020-04-13 DIAGNOSIS — I471 Supraventricular tachycardia: Secondary | ICD-10-CM | POA: Diagnosis present

## 2020-04-13 DIAGNOSIS — Z20822 Contact with and (suspected) exposure to covid-19: Secondary | ICD-10-CM | POA: Diagnosis not present

## 2020-04-13 DIAGNOSIS — Z743 Need for continuous supervision: Secondary | ICD-10-CM | POA: Diagnosis not present

## 2020-04-13 DIAGNOSIS — Z87891 Personal history of nicotine dependence: Secondary | ICD-10-CM | POA: Diagnosis not present

## 2020-04-13 DIAGNOSIS — I10 Essential (primary) hypertension: Secondary | ICD-10-CM | POA: Diagnosis present

## 2020-04-13 DIAGNOSIS — R778 Other specified abnormalities of plasma proteins: Secondary | ICD-10-CM

## 2020-04-13 DIAGNOSIS — I169 Hypertensive crisis, unspecified: Secondary | ICD-10-CM

## 2020-04-13 DIAGNOSIS — N179 Acute kidney failure, unspecified: Secondary | ICD-10-CM | POA: Diagnosis not present

## 2020-04-13 DIAGNOSIS — Z8249 Family history of ischemic heart disease and other diseases of the circulatory system: Secondary | ICD-10-CM | POA: Diagnosis not present

## 2020-04-13 DIAGNOSIS — J9601 Acute respiratory failure with hypoxia: Secondary | ICD-10-CM | POA: Diagnosis present

## 2020-04-13 DIAGNOSIS — E1165 Type 2 diabetes mellitus with hyperglycemia: Secondary | ICD-10-CM | POA: Diagnosis present

## 2020-04-13 DIAGNOSIS — Z833 Family history of diabetes mellitus: Secondary | ICD-10-CM | POA: Diagnosis not present

## 2020-04-13 DIAGNOSIS — R0602 Shortness of breath: Secondary | ICD-10-CM | POA: Diagnosis not present

## 2020-04-13 DIAGNOSIS — R7989 Other specified abnormal findings of blood chemistry: Secondary | ICD-10-CM

## 2020-04-13 DIAGNOSIS — I447 Left bundle-branch block, unspecified: Secondary | ICD-10-CM | POA: Diagnosis not present

## 2020-04-13 DIAGNOSIS — I5043 Acute on chronic combined systolic (congestive) and diastolic (congestive) heart failure: Secondary | ICD-10-CM | POA: Diagnosis present

## 2020-04-13 DIAGNOSIS — Z79899 Other long term (current) drug therapy: Secondary | ICD-10-CM

## 2020-04-13 DIAGNOSIS — R197 Diarrhea, unspecified: Secondary | ICD-10-CM | POA: Diagnosis not present

## 2020-04-13 DIAGNOSIS — E785 Hyperlipidemia, unspecified: Secondary | ICD-10-CM | POA: Diagnosis not present

## 2020-04-13 DIAGNOSIS — R0902 Hypoxemia: Secondary | ICD-10-CM | POA: Diagnosis not present

## 2020-04-13 DIAGNOSIS — I5022 Chronic systolic (congestive) heart failure: Secondary | ICD-10-CM | POA: Diagnosis not present

## 2020-04-13 DIAGNOSIS — R404 Transient alteration of awareness: Secondary | ICD-10-CM | POA: Diagnosis not present

## 2020-04-13 LAB — BASIC METABOLIC PANEL
Anion gap: 13 (ref 5–15)
BUN: 19 mg/dL (ref 6–20)
CO2: 27 mmol/L (ref 22–32)
Calcium: 8.9 mg/dL (ref 8.9–10.3)
Chloride: 98 mmol/L (ref 98–111)
Creatinine, Ser: 0.97 mg/dL (ref 0.44–1.00)
GFR calc Af Amer: >60 mL/min (ref >60)
GFR calc non Af Amer: >60 mL/min (ref >60)
Glucose, Bld: 157 mg/dL — ABNORMAL HIGH (ref 70–99)
Potassium: 4.5 mmol/L (ref 3.5–5.1)
Sodium: 138 mmol/L (ref 135–145)

## 2020-04-13 LAB — TROPONIN I (HIGH SENSITIVITY)
Troponin I (High Sensitivity): 39 ng/L — ABNORMAL HIGH (ref <18)
Troponin I (High Sensitivity): 82 ng/L — ABNORMAL HIGH (ref <18)
Troponin I (High Sensitivity): 344 ng/L (ref <18)

## 2020-04-13 LAB — COMPREHENSIVE METABOLIC PANEL
ALT: 42 U/L (ref 0–44)
AST: 62 U/L — ABNORMAL HIGH (ref 15–41)
Albumin: 3.7 g/dL (ref 3.5–5.0)
Alkaline Phosphatase: 115 U/L (ref 38–126)
Anion gap: 13 (ref 5–15)
BUN: 15 mg/dL (ref 6–20)
CO2: 21 mmol/L — ABNORMAL LOW (ref 22–32)
Calcium: 8.3 mg/dL — ABNORMAL LOW (ref 8.9–10.3)
Chloride: 100 mmol/L (ref 98–111)
Creatinine, Ser: 1.11 mg/dL — ABNORMAL HIGH (ref 0.44–1.00)
GFR calc Af Amer: >60 mL/min (ref >60)
GFR calc non Af Amer: 55 mL/min — ABNORMAL LOW (ref >60)
Glucose, Bld: 382 mg/dL — ABNORMAL HIGH (ref 70–99)
Potassium: 3.7 mmol/L (ref 3.5–5.1)
Sodium: 134 mmol/L — ABNORMAL LOW (ref 135–145)
Total Bilirubin: 0.6 mg/dL (ref 0.3–1.2)
Total Protein: 7.9 g/dL (ref 6.5–8.1)

## 2020-04-13 LAB — CBC WITH DIFFERENTIAL/PLATELET
Abs Immature Granulocytes: 0.12 10*3/uL — ABNORMAL HIGH (ref 0.00–0.07)
Basophils Absolute: 0.1 10*3/uL (ref 0.0–0.1)
Basophils Relative: 1 %
Eosinophils Absolute: 0.2 10*3/uL (ref 0.0–0.5)
Eosinophils Relative: 2 %
HCT: 42.8 % (ref 36.0–46.0)
Hemoglobin: 13.5 g/dL (ref 12.0–15.0)
Immature Granulocytes: 1 %
Lymphocytes Relative: 39 %
Lymphs Abs: 4.7 10*3/uL — ABNORMAL HIGH (ref 0.7–4.0)
MCH: 29.3 pg (ref 26.0–34.0)
MCHC: 31.5 g/dL (ref 30.0–36.0)
MCV: 92.8 fL (ref 80.0–100.0)
Monocytes Absolute: 0.5 10*3/uL (ref 0.1–1.0)
Monocytes Relative: 4 %
Neutro Abs: 6.4 10*3/uL (ref 1.7–7.7)
Neutrophils Relative %: 53 %
Platelets: 314 10*3/uL (ref 150–400)
RBC: 4.61 MIL/uL (ref 3.87–5.11)
RDW: 14.6 % (ref 11.5–15.5)
WBC: 12 10*3/uL — ABNORMAL HIGH (ref 4.0–10.5)
nRBC: 0 % (ref 0.0–0.2)

## 2020-04-13 LAB — GLUCOSE, CAPILLARY
Glucose-Capillary: 134 mg/dL — ABNORMAL HIGH (ref 70–99)
Glucose-Capillary: 144 mg/dL — ABNORMAL HIGH (ref 70–99)
Glucose-Capillary: 123 mg/dL — ABNORMAL HIGH (ref 70–99)
Glucose-Capillary: 166 mg/dL — ABNORMAL HIGH (ref 70–99)

## 2020-04-13 LAB — URINALYSIS, ROUTINE W REFLEX MICROSCOPIC
Bilirubin Urine: NEGATIVE
Glucose, UA: NEGATIVE mg/dL
Ketones, ur: NEGATIVE mg/dL
Leukocytes,Ua: NEGATIVE
Nitrite: NEGATIVE
Protein, ur: 30 mg/dL — AB
Specific Gravity, Urine: 1.012 (ref 1.005–1.030)
pH: 7 (ref 5.0–8.0)

## 2020-04-13 LAB — HEPARIN LEVEL (UNFRACTIONATED): Heparin Unfractionated: 0.71 IU/mL — ABNORMAL HIGH (ref 0.30–0.70)

## 2020-04-13 LAB — SARS CORONAVIRUS 2 BY RT PCR (HOSPITAL ORDER, PERFORMED IN ~~LOC~~ HOSPITAL LAB): SARS Coronavirus 2: NEGATIVE

## 2020-04-13 LAB — LACTIC ACID, PLASMA
Lactic Acid, Venous: 5.2 mmol/L (ref 0.5–1.9)
Lactic Acid, Venous: 3.7 mmol/L (ref 0.5–1.9)

## 2020-04-13 LAB — MAGNESIUM: Magnesium: 2 mg/dL (ref 1.7–2.4)

## 2020-04-13 LAB — HEMOGLOBIN A1C
Hgb A1c MFr Bld: 7.3 % — ABNORMAL HIGH (ref 4.8–5.6)
Mean Plasma Glucose: 162.81 mg/dL

## 2020-04-13 LAB — RAPID URINE DRUG SCREEN, HOSP PERFORMED
Amphetamines: NOT DETECTED
Barbiturates: NOT DETECTED
Benzodiazepines: NOT DETECTED
Cocaine: NOT DETECTED
Opiates: NOT DETECTED
Tetrahydrocannabinol: NOT DETECTED

## 2020-04-13 LAB — MRSA PCR SCREENING: MRSA by PCR: NEGATIVE

## 2020-04-13 LAB — ECHOCARDIOGRAM COMPLETE
Height: 64 in
Weight: 2790.14 oz

## 2020-04-13 LAB — BRAIN NATRIURETIC PEPTIDE: B Natriuretic Peptide: 364 pg/mL — ABNORMAL HIGH (ref 0.0–100.0)

## 2020-04-13 LAB — HIV ANTIBODY (ROUTINE TESTING W REFLEX): HIV Screen 4th Generation wRfx: NONREACTIVE

## 2020-04-13 LAB — TSH: TSH: 1.277 u[IU]/mL (ref 0.350–4.500)

## 2020-04-13 MED ORDER — LABETALOL HCL 5 MG/ML IV SOLN: 10.0000 mg | INTRAVENOUS | Status: DC | PRN

## 2020-04-13 MED ORDER — SODIUM CHLORIDE 0.9% FLUSH: 3.0000 mL | INTRAVENOUS | Status: DC | PRN

## 2020-04-13 MED ORDER — ORAL CARE MOUTH RINSE
15.0000 mL | Freq: Two times a day (BID) | OROMUCOSAL | Status: DC
  Administered 2020-04-13 – 2020-04-14 (×3): 15 mL via OROMUCOSAL

## 2020-04-13 MED ORDER — CARVEDILOL 3.125 MG PO TABS
3.1250 mg | ORAL_TABLET | Freq: Two times a day (BID) | ORAL | Status: DC
  Administered 2020-04-13 – 2020-04-15 (×3): 3.125 mg via ORAL
  Filled 2020-04-13 (×3): qty 1

## 2020-04-13 MED ORDER — INSULIN ASPART 100 UNIT/ML ~~LOC~~ SOLN: 0.0000 [IU] | Freq: Every day | SUBCUTANEOUS | Status: DC

## 2020-04-13 MED ORDER — ASPIRIN 81 MG PO CHEW
81.0000 mg | CHEWABLE_TABLET | Freq: Every day | ORAL | Status: DC
  Administered 2020-04-14 – 2020-04-15 (×2): 81 mg via ORAL
  Filled 2020-04-13 (×2): qty 1

## 2020-04-13 MED ORDER — CHLORHEXIDINE GLUCONATE CLOTH 2 % EX PADS
6.0000 | MEDICATED_PAD | Freq: Every day | CUTANEOUS | Status: DC
  Administered 2020-04-13 – 2020-04-14 (×2): 6 via TOPICAL

## 2020-04-13 MED ORDER — ONDANSETRON HCL 4 MG/2ML IJ SOLN: 4.0000 mg | Freq: Four times a day (QID) | INTRAMUSCULAR | Status: DC | PRN

## 2020-04-13 MED ORDER — NITROGLYCERIN IN D5W 200-5 MCG/ML-% IV SOLN
5.0000 ug/min | INTRAVENOUS | Status: DC
  Administered 2020-04-13: 5 ug/min via INTRAVENOUS

## 2020-04-13 MED ORDER — SODIUM CHLORIDE 0.9% FLUSH
3.0000 mL | Freq: Two times a day (BID) | INTRAVENOUS | Status: DC
  Administered 2020-04-13 – 2020-04-14 (×3): 3 mL via INTRAVENOUS

## 2020-04-13 MED ORDER — SODIUM CHLORIDE 0.9 % IV SOLN: INTRAVENOUS | Status: DC

## 2020-04-13 MED ORDER — LISINOPRIL 10 MG PO TABS
40.0000 mg | ORAL_TABLET | Freq: Every day | ORAL | Status: DC
  Administered 2020-04-13: 40 mg via ORAL
  Filled 2020-04-13: qty 4

## 2020-04-13 MED ORDER — SODIUM CHLORIDE 0.9 % IV SOLN: 250.0000 mL | INTRAVENOUS | Status: DC | PRN

## 2020-04-13 MED ORDER — CHLORTHALIDONE 25 MG PO TABS
12.5000 mg | ORAL_TABLET | Freq: Every day | ORAL | Status: DC
  Administered 2020-04-13: 12.5 mg via ORAL
  Filled 2020-04-13: qty 0.5
  Filled 2020-04-13: qty 1
  Filled 2020-04-13: qty 0.5

## 2020-04-13 MED ORDER — HEPARIN (PORCINE) 25000 UT/250ML-% IV SOLN
750.0000 [IU]/h | INTRAVENOUS | Status: DC
  Administered 2020-04-13: 900 [IU]/h via INTRAVENOUS
  Filled 2020-04-13: qty 250

## 2020-04-13 MED ORDER — ISOSORBIDE MONONITRATE ER 30 MG PO TB24
30.0000 mg | ORAL_TABLET | Freq: Every day | ORAL | Status: DC
  Administered 2020-04-13 – 2020-04-14 (×2): 30 mg via ORAL
  Filled 2020-04-13 (×2): qty 1

## 2020-04-13 MED ORDER — ATORVASTATIN CALCIUM 80 MG PO TABS
80.0000 mg | ORAL_TABLET | Freq: Every day | ORAL | Status: DC
  Administered 2020-04-13 – 2020-04-15 (×3): 80 mg via ORAL
  Filled 2020-04-13: qty 1
  Filled 2020-04-13: qty 2
  Filled 2020-04-13: qty 1

## 2020-04-13 MED ORDER — ASPIRIN 81 MG PO CHEW
81.0000 mg | CHEWABLE_TABLET | ORAL | Status: AC
  Administered 2020-04-14: 81 mg via ORAL
  Filled 2020-04-13: qty 1

## 2020-04-13 MED ORDER — HEPARIN BOLUS VIA INFUSION
4000.0000 [IU] | Freq: Once | INTRAVENOUS | Status: AC
  Administered 2020-04-13: 4000 [IU] via INTRAVENOUS
  Filled 2020-04-13: qty 4000

## 2020-04-13 MED ORDER — FUROSEMIDE 10 MG/ML IJ SOLN
80.0000 mg | Freq: Once | INTRAMUSCULAR | Status: AC
  Administered 2020-04-13: 80 mg via INTRAVENOUS

## 2020-04-13 MED ORDER — ACETAMINOPHEN 325 MG PO TABS
650.0000 mg | ORAL_TABLET | ORAL | Status: DC | PRN
  Administered 2020-04-13 – 2020-04-14 (×2): 650 mg via ORAL
  Filled 2020-04-13 (×2): qty 2

## 2020-04-13 MED ORDER — INSULIN ASPART 100 UNIT/ML ~~LOC~~ SOLN
0.0000 [IU] | Freq: Three times a day (TID) | SUBCUTANEOUS | Status: DC
  Administered 2020-04-13 – 2020-04-15 (×5): 2 [IU] via SUBCUTANEOUS

## 2020-04-13 MED ORDER — AMLODIPINE BESYLATE 10 MG PO TABS
10.0000 mg | ORAL_TABLET | Freq: Every day | ORAL | Status: DC
  Administered 2020-04-13: 10 mg via ORAL
  Filled 2020-04-13: qty 2

## 2020-04-13 MED ORDER — ASPIRIN 81 MG PO CHEW
324.0000 mg | CHEWABLE_TABLET | Freq: Once | ORAL | Status: AC
  Administered 2020-04-13: 324 mg via ORAL
  Filled 2020-04-13: qty 4

## 2020-04-13 MED ORDER — HEPARIN SODIUM (PORCINE) 5000 UNIT/ML IJ SOLN
5000.0000 [IU] | Freq: Three times a day (TID) | INTRAMUSCULAR | Status: DC
  Administered 2020-04-13: 5000 [IU] via SUBCUTANEOUS
  Filled 2020-04-13: qty 1

## 2020-04-13 NOTE — ED Notes (Signed)
Date and time results received: 04/13/20 0150 (use smartphrase ".now" to insert current time)  Test: Lactic acid Critical Value: 5.2  Name of Provider Notified: Dr Tomi Bamberger  Orders Received? Or Actions Taken?: Actions Taken: no orders received

## 2020-04-13 NOTE — Progress Notes (Signed)
  Echocardiogram 2D Echocardiogram has been performed.  Bobbye [place] 04/13/2020, 9:49 AM

## 2020-04-13 NOTE — Progress Notes (Addendum)
Spoke with patient. Risks and benefits of cardiac catheterization have been discussed with the patient.  These include bleeding, infection, kidney damage, stroke, heart attack, death.  The patient understands these risks and is willing to proceed. Have scheduled @ [facility] tomorrow for 10am with Dr. Fletcher Anon. Confirmed with Dr. Harl Bowie he wants [facility] only. Orders written per our discussion.  He indicated IM plans to keep patient on their service and they will facilitate transfer. We discussed current medication regimen and he recommends to continue present meds pre-cath, and add low dose carvedilol starting this evening.  Sarabeth Benton PA-C

## 2020-04-13 NOTE — Progress Notes (Signed)
ANTICOAGULATION CONSULT NOTE  Pharmacy Consult for heparin Indication: chest pain/ACS  Patient Measurements: Height: 5\' 4"  (162.6 cm) Weight: 79.1 kg (174 lb 6.1 oz) IBW/kg (Calculated) : 54.7 Heparin Dosing Weight: 71.6 kg  Vital Signs: Temp: 98.8 F (37.1 C) (05/20 1800) Temp Source: Oral (05/20 1631) BP: 142/75 (05/20 1800) Pulse Rate: 78 (05/20 1800)  Labs: Recent Labs    04/13/20 0053 04/13/20 0232 04/13/20 0805 04/13/20 1839  HGB 13.5  --   --   --   HCT 42.8  --   --   --   PLT 314  --   --   --   HEPARINUNFRC  --   --   --  0.71*  CREATININE 1.11*  --  0.97  --   TROPONINIHS 39* 82* 344*  --     Medical History: Past Medical History:  Diagnosis Date  . Diabetes mellitus without complication ([place])   . Diarrhea   . Herpes simplex infection   . Hyperlipidemia   . Hypertension     Assessment: Pharmacy consulted to dose heparin in patient with chest pain/ACS.  Patient is not on anticoagulation prior to admission. The initial heparin rate returned slightly supratherapeutic.   Goal of Therapy:  Heparin level 0.3-0.7 units/ml Monitor platelets by anticoagulation protocol: Yes   Plan:  -Reduce heparin to 750 units/hr -Daily HL, CBC  -Next level with AM labs   Tashonna Descoteaux, Jake Church 04/13/2020,7:31 PM

## 2020-04-13 NOTE — ED Provider Notes (Signed)
[facility] EMERGENCY DEPARTMENT Provider Note   CSN: [id number] Arrival date & time: 04/13/20  0050   Time seen 12:44 AM on arrival  History Chief Complaint  Patient presents with  . Respiratory Distress   Level 5 caveat for respiratory distress  Michelle Morrison is a 59 y.o. female.  HPI   EMS states they were called for respiratory distress.  They report her initial pulse ox was 38%.  They describe rales in all lung fields.  She was sweaty and cyanotic.  They put her on CPAP and her pulse ox improved to 94%.  They gave her 3 nitroglycerin and the last blood pressure they got before the third nitroglycerin was blood pressure of [id number] systolic.  They states she had shook her head no that she was not having chest pain.  PCP Rosita Fire, MD   Past Medical History:  Diagnosis Date  . Diabetes mellitus without complication ([place])   . Diarrhea   . Herpes simplex infection   . Hyperlipidemia   . Hypertension     Patient Active Problem List   Diagnosis Date Noted  . 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 Herpes simplex infection 11/21/2015  . HTN (hypertension), benign 04/01/2013    Past Surgical History:  Procedure Laterality Date  . CESAREAN SECTION    . CHOLECYSTECTOMY    . COLONOSCOPY N/A 08/26/2014   Procedure: COLONOSCOPY;  Surgeon: [name], MD;  Location: [facility];  Service: Endoscopy;  Laterality: N/A;  9:30 AM - moved to 8:30 - Ginger to notify pt     OB History    Gravida  2   Para  2   Term  1   Preterm  1   AB      Living  2     SAB      TAB      Ectopic      Multiple      Live Births  2           Family History  Problem Relation Age of Onset  . Hypertension Mother   . Diabetes Mother   . Colon cancer Neg Hx     Social History   Tobacco Use  . Smoking status: Former Smoker    Types: Cigarettes  . Smokeless tobacco: Never Used  Substance Use Topics  . Alcohol use: No    Alcohol/week: 0.0 standard drinks  .  Drug use: No  lives at home  Home Medications Prior to Admission medications   Medication Sig Start Date End Date Taking? Authorizing Provider  amLODipine (NORVASC) 10 MG tablet Take 10 mg by mouth daily.    [provider]  dicyclomine (BENTYL) 10 MG capsule 1 PO 30 MINUTES PRIOR TO BREAKFAST AND LUNCH AND 2 IF NEEDED 03/05/17   Fields, Sandi L, MD  hydrochlorothiazide (MICROZIDE) 12.5 MG capsule Take 12.5 mg by mouth daily.    [provider]  lisinopril (PRINIVIL,ZESTRIL) 40 MG tablet Take 40 mg by mouth daily.    [provider]  simvastatin (ZOCOR) 40 MG tablet Take 40 mg by mouth daily.    [provider]  TRADJENTA 5 MG TABS tablet Take 5 mg by mouth daily.  07/28/16   [provider]    Allergies    Patient has no known allergies.  Review of Systems   Review of Systems  Unable to perform ROS: Severe respiratory distress    Physical Exam Updated Vital Signs BP Marland Kitchen)  141/79   Pulse 90   Resp (!) 23   Wt 79 kg   LMP 05/23/2007   SpO2 98%   BMI 30.85 kg/m   Physical Exam Vitals and nursing note reviewed.  Constitutional:      General: She is in acute distress.     Appearance: She is obese.  HENT:     Head: Normocephalic and atraumatic.  Eyes:     Extraocular Movements: Extraocular movements intact.     Conjunctiva/sclera: Conjunctivae normal.     Pupils: Pupils are equal, round, and reactive to light.     Comments: Eyes are open, patient does not make eye contact  Cardiovascular:     Rate and Rhythm: Regular rhythm. Tachycardia present.  Pulmonary:     Effort: Tachypnea, accessory muscle usage, prolonged expiration, respiratory distress and retractions present.     Breath sounds: Decreased air movement present. Examination of the right-upper field reveals rhonchi. Examination of the left-upper field reveals rhonchi. Examination of the right-middle field reveals rhonchi. Examination of the left-middle field reveals rhonchi.  Examination of the right-lower field reveals rhonchi. Examination of the left-lower field reveals rhonchi. Rhonchi present.     Comments: Rales were audibly heard Musculoskeletal:     Cervical back: Normal range of motion.     Right lower leg: No edema.     Left lower leg: No edema.  Skin:    General: Skin is warm and dry.  Neurological:     Mental Status: She is alert.     Comments: Unable to assess  Psychiatric:        Speech: She is noncommunicative.     ED Results / Procedures / Treatments   Labs (all labs ordered are listed, but only abnormal results are displayed) Results for orders placed or performed during the hospital encounter of 04/13/20  SARS Coronavirus 2 by RT PCR (hospital order, performed in [place] hospital lab) Nasopharyngeal Nasopharyngeal Swab   Specimen: Nasopharyngeal Swab  Result Value Ref Range   SARS Coronavirus 2 NEGATIVE NEGATIVE  Comprehensive metabolic panel  Result Value Ref Range   Sodium 134 (L) 135 - 145 mmol/L   Potassium 3.7 3.5 - 5.1 mmol/L   Chloride 100 98 - 111 mmol/L   CO2 21 (L) 22 - 32 mmol/L   Glucose, Bld 382 (H) 70 - 99 mg/dL   BUN 15 6 - 20 mg/dL   Creatinine, Ser 1.11 (H) 0.44 - 1.00 mg/dL   Calcium 8.3 (L) 8.9 - 10.3 mg/dL   Total Protein 7.9 6.5 - 8.1 g/dL   Albumin 3.7 3.5 - 5.0 g/dL   AST 62 (H) 15 - 41 U/L   ALT 42 0 - 44 U/L   Alkaline Phosphatase 115 38 - 126 U/L   Total Bilirubin 0.6 0.3 - 1.2 mg/dL   GFR calc non Af Amer 55 (L) >60 mL/min   GFR calc Af Amer >60 >60 mL/min   Anion gap 13 5 - 15  Brain natriuretic peptide  Result Value Ref Range   B Natriuretic Peptide 364.0 (H) 0.0 - 100.0 pg/mL  Lactic acid, plasma  Result Value Ref Range   Lactic Acid, Venous 5.2 (HH) 0.5 - 1.9 mmol/L  CBC with Differential  Result Value Ref Range   WBC 12.0 (H) 4.0 - 10.5 K/uL   RBC 4.61 3.87 - 5.11 MIL/uL   Hemoglobin 13.5 12.0 - 15.0 g/dL   HCT 42.8 36.0 - 46.0 %   MCV 92.8 80.0 - 100.0  fL   MCH 29.3 26.0 - 34.0  pg   MCHC 31.5 30.0 - 36.0 g/dL   RDW 14.6 11.5 - 15.5 %   Platelets 314 150 - 400 K/uL   nRBC 0.0 0.0 - 0.2 %   Neutrophils Relative % 53 %   Neutro Abs 6.4 1.7 - 7.7 K/uL   Lymphocytes Relative 39 %   Lymphs Abs 4.7 (H) 0.7 - 4.0 K/uL   Monocytes Relative 4 %   Monocytes Absolute 0.5 0.1 - 1.0 K/uL   Eosinophils Relative 2 %   Eosinophils Absolute 0.2 0.0 - 0.5 K/uL   Basophils Relative 1 %   Basophils Absolute 0.1 0.0 - 0.1 K/uL   Immature Granulocytes 1 %   Abs Immature Granulocytes 0.12 (H) 0.00 - 0.07 K/uL  Troponin I (High Sensitivity)  Result Value Ref Range   Troponin I (High Sensitivity) 39 (H) <18 ng/L   Laboratory interpretation all normal except hyperglycemia, leukocytosis, initial troponin positive, mild elevation of BNP, renal insufficiency    EKG EKG Interpretation  Date/Time:  Thursday Apr 13 2020 00:52:28 EDT Ventricular Rate:  119 PR Interval:    QRS Duration: 135 QT Interval:  397 QTC Calculation: 559 R Axis:   112 Text Interpretation: Sinus tachycardia Left atrial enlargement Consider left ventricular hypertrophy Since last tracing rate faster 03 May 2011 Lateral infarct, acute Anterior ST elevation, probably due to LVH Prolonged QT interval Confirmed by Rolland Porter [phone]) on 04/13/2020 12:55:05 AM   Radiology DG Chest Port 1 View  Result Date: 04/13/2020 CLINICAL DATA:  Hypoxia. EXAM: PORTABLE CHEST 1 VIEW COMPARISON:  01/14/2006 FINDINGS: There are prominent interstitial lung markings bilaterally with hazy airspace opacities lung bases bilaterally as well as in the right upper lung zone. The heart size is enlarged. There are probable small bilateral pleural effusions without evidence for pneumothorax. Aortic calcifications are noted. There is no acute osseous abnormality. IMPRESSION: Constellation of findings which can be seen in patients with pulmonary edema or an atypical infectious process. Electronically Signed   By: Constance Holster M.D.   On:  04/13/2020 01:36    Procedures .Critical Care Performed by: Rolland Porter, MD Authorized by: Rolland Porter, MD   Critical care provider statement:    Critical care time (minutes):  40   Critical care was necessary to treat or prevent imminent or life-threatening deterioration of the following conditions:  Respiratory failure   Critical care was time spent personally by me on the following activities:  Discussions with consultants, examination of patient, obtaining history from patient or surrogate, ordering and review of laboratory studies, ordering and review of radiographic studies, pulse oximetry and re-evaluation of patient's condition   (including critical care time)  Medications Ordered in ED Medications  nitroGLYCERIN 50 mg in dextrose 5 % 250 mL (0.2 mg/mL) infusion (20 mcg/min Intravenous Rate/Dose Verify 04/13/20 0138)  furosemide (LASIX) injection 80 mg (80 mg Intravenous Given 04/13/20 0059)    ED Course  I have reviewed the triage vital signs and the nursing notes.  Pertinent labs & imaging results that were available during my care of the patient were reviewed by me and considered in my medical decision making (see chart for details).    MDM Rules/Calculators/A&P                      Initial vital signs were blood pressure 202/124, heart rate 122, pulse ox 93% on BiPAP, respiratory rate 29  Patient was placed on the  monitor.  She was placed on BiPAP.  She was given Lasix 80 mg IV, on exam she appears to be in congestive heart failure.  She also was started on a nitroglycerin drip due to her hypotension.  Recheck at 1:00 AM patient now is able to communicate.  She indicates she did not have any chest pain.  She states the shortness of breath happened suddenly.  She denies being on any medications for hypertension.  Her blood pressure now is 186/129 on nitroglycerin drip, heart rate 107, pulse ox 97% on BiPAP.  Recheck 2:00 AM blood pressure is 141/79 on nitroglycerin drip,  heart rate 90, pulse ox 98% on BiPAP.  Her respiratory rate is 23.  Patient states she is feeling much better.  When I listen to her she still has rales at the bases.  She has 350 cc urinary output in her catheter.  Mother is at bedside and explained that if feel like she probably had some uncontrolled hypertension which put her into congestive heart failure.  We also discussed need for admission.  2:21 AM Dr. Humphrey Rolls, hospitalist will admit.  Review of patient's chart shows she got the Materna vaccine on March 25 and April 22 Final Clinical Impression(s) / ED Diagnoses Final diagnoses:  Acute respiratory failure with hypoxia ([place])  Hypertensive crisis  Acute congestive heart failure, unspecified heart failure type [facility])    Rx / DC Orders  Plan admission   Rolland Porter, MD, Barbette Or, MD 04/13/20 [phone]

## 2020-04-13 NOTE — Progress Notes (Signed)
PT Cancellation Note  Patient Details Name: Michelle Morrison MRN: [id number] DOB: 1961/06/12   Cancelled Treatment:    Reason Eval/Treat Not Completed: Medical issues which prohibited therapy.  PT held per RN and cardiologist MD request due to high troponin.  Will check back when patient medically stable.   12:34 PM, 04/13/20 Lonell Grandchild, MPT Physical Therapist with [facility] [phone] office [phone] mobile phone

## 2020-04-13 NOTE — H&P (Addendum)
History and Physical    Michelle Morrison [id number] DOB: 08/29/1961 DOA: 04/13/2020  PCP: Rosita Fire, MD (Confirm with patient/family/NH records and if not entered, this has to be entered at [facility] point of entry) Patient coming from: Home  I have personally briefly reviewed patient's old medical records in [place]  Chief Complaint: Dyspnea  HPI: Michelle Morrison is a 59 y.o. female with medical history significant of hypertension, hyperlipidemia and diabetes mellitus brought to ED by EMS for evaluation of severe respiratory distress.  Patient states that the problem started earlier today suddenly and she also felt heaviness in her lower extremities.  Patient reported that she is having severe shortness of breath with exertion and she was feeling that she is going very low in oxygen therefore she called EMS for help.  Patient otherwise denies fever, chills, cough, chest pain, nausea, vomiting, abdominal pain and urinary symptoms.  Patient denies previous history of heart failure and also denied history of COPD.  Patient states that she quit smoking many years ago.  When the EMS arrived, patient was severely hypoxic with oxygen saturation of 38% and she was started on oxygen supplementation with CPAP and they also gave her glycine because of very high blood pressure.  During my evaluation patient was on BiPAP but was able to talk and requested that BiPAP should be removed.  RT was called and BiPAP deescalated to nasal cannula and patient was in no respiratory distress and saturated well on 2 L of oxygen with nasal cannula  ED Course: On arrival to ED patient had blood pressure of 202/124, heart rate 115, respiratory rate 26 and started on BiPAP with FiO2 of 40.  Blood work showed WBC 12, hemoglobin 13.5, sodium 134, potassium 3.7, BUN 15, creatinine 1.1, blood glucose 382, BNP 364.  First set of troponin was 39.  Chest x-ray showed enlarged heart consistent with pulmonary edema or an atypical  infectious process.  Started on nitroglycerin drip in the ED and given 1 dose of IV Lasix 80 mg.  Patient produced about 600 mL of urine in the ED.  Review of Systems: As per HPI otherwise 10 point review of systems negative.  Unacceptable ROS statements: "10 systems reviewed," "Extensive" (without elaboration).  Acceptable ROS statements: "All others negative," "All others reviewed and are negative," and "All others unremarkable," with at [place] documented Can't double dip - if using for HPI can't use for ROS  Past Medical History:  Diagnosis Date  . Diabetes mellitus without complication ([place])   . Diarrhea   . Herpes simplex infection   . Hyperlipidemia   . Hypertension     Past Surgical History:  Procedure Laterality Date  . CESAREAN SECTION    . CHOLECYSTECTOMY    . COLONOSCOPY N/A 08/26/2014   Procedure: COLONOSCOPY;  Surgeon: [name], MD;  Location: [facility];  Service: Endoscopy;  Laterality: N/A;  9:30 AM - moved to 8:30 - Ginger to notify pt     reports that she has quit smoking. Her smoking use included cigarettes. She has never used smokeless tobacco. She reports that she does not drink alcohol or use drugs.  No Known Allergies  Family History  Problem Relation Age of Onset  . Hypertension Mother   . Diabetes Mother   . Colon cancer Neg Hx     Unacceptable: Noncontributory, unremarkable, or negative. Acceptable: (example)Family history negative for heart disease  Prior to Admission medications   Medication Sig Start  Date End Date Taking? Authorizing Provider  amLODipine (NORVASC) 10 MG tablet Take 10 mg by mouth daily.    [provider]  dicyclomine (BENTYL) 10 MG capsule 1 PO 30 MINUTES PRIOR TO BREAKFAST AND LUNCH AND 2 IF NEEDED 03/05/17   Fields, Sandi L, MD  hydrochlorothiazide (MICROZIDE) 12.5 MG capsule Take 12.5 mg by mouth daily.    [provider]  lisinopril (PRINIVIL,ZESTRIL) 40 MG tablet Take 40 mg by mouth daily.     [provider]  simvastatin (ZOCOR) 40 MG tablet Take 40 mg by mouth daily.    [provider]  TRADJENTA 5 MG TABS tablet Take 5 mg by mouth daily.  07/28/16   [provider]    Physical Exam: Vitals:   04/13/20 0530 04/13/20 0600 04/13/20 0630 04/13/20 0700  BP: 120/70 112/63 126/75 (!) 141/73  Pulse: 84 81 77 77  Resp: 20 16 16 16   Temp: 97.7 F (36.5 C) (!) 97.5 F (36.4 C) (!) 97.5 F (36.4 C) (!) 97.5 F (36.4 C)  TempSrc: Bladder Bladder Bladder   SpO2: 95% 96% 98% 99%  Weight:      Height:        Constitutional: NAD, calm, comfortable Vitals:   04/13/20 0530 04/13/20 0600 04/13/20 0630 04/13/20 0700  BP: 120/70 112/63 126/75 (!) 141/73  Pulse: 84 81 77 77  Resp: 20 16 16 16   Temp: 97.7 F (36.5 C) (!) 97.5 F (36.4 C) (!) 97.5 F (36.4 C) (!) 97.5 F (36.4 C)  TempSrc: Bladder Bladder Bladder   SpO2: 95% 96% 98% 99%  Weight:      Height:        General: Obese 59 year old African-American female on BiPAP. Eyes: PERRL, lids and conjunctivae normal ENMT: Mucous membranes are moist. Posterior pharynx clear of any exudate or lesions.Normal dentition.  Neck: normal, supple, no masses, no thyromegaly Respiratory: Patient is on BiPAP at this time but requesting to remove the BiPAP.  Chest is clear to auscultation bilaterally, no wheezing, no crackles. Normal respiratory effort. No accessory muscle use.  Cardiovascular: Regular rate and rhythm, no murmurs / rubs / gallops. No extremity edema. 2+ pedal pulses. No carotid bruits.  Abdomen: no tenderness, no masses palpated. No hepatosplenomegaly. Bowel sounds positive.  Musculoskeletal: no clubbing / cyanosis. No joint deformity upper and lower extremities. Good ROM, no contractures. Normal muscle tone.  Skin: no rashes, lesions, ulcers. No induration Neurologic: CN 2-12 grossly intact. Sensation intact, DTR normal. Strength 5/5 in all 4.  Psychiatric: Normal judgment and insight. Alert and  oriented x 3. Normal mood.   (Anything < 9 systems with 2 bullets each down codes to level 1) (If patient refuses exam can't bill higher level) (Make sure to document decubitus ulcers present on admission -- if possible -- and whether patient has chronic indwelling catheter at time of admission)  Labs on Admission: I have personally reviewed following labs and imaging studies  CBC: Recent Labs  Lab 04/13/20 0053  WBC 12.0*  NEUTROABS 6.4  HGB 13.5  HCT 42.8  MCV 92.8  PLT [id number]   Basic Metabolic Panel: Recent Labs  Lab 04/13/20 0053  NA 134*  K 3.7  CL 100  CO2 21*  GLUCOSE 382*  BUN 15  CREATININE 1.11*  CALCIUM 8.3*   GFR: Estimated Creatinine Clearance: 56.3 mL/min (A) (by C-G formula based on SCr of 1.11 mg/dL (H)). Liver Function Tests: Recent Labs  Lab 04/13/20 0053  AST 62*  ALT 42  ALKPHOS 115  BILITOT 0.6  PROT 7.9  ALBUMIN 3.7   No results for input(s): LIPASE, AMYLASE in the last 168 hours. No results for input(s): AMMONIA in the last 168 hours. Coagulation Profile: No results for input(s): INR, PROTIME in the last 168 hours. Cardiac Enzymes: No results for input(s): CKTOTAL, CKMB, CKMBINDEX, TROPONINI in the last 168 hours. BNP (last 3 results) No results for input(s): PROBNP in the last 8760 hours. HbA1C: No results for input(s): HGBA1C in the last 72 hours. CBG: No results for input(s): GLUCAP in the last 168 hours. Lipid Profile: No results for input(s): CHOL, HDL, LDLCALC, TRIG, CHOLHDL, LDLDIRECT in the last 72 hours. Thyroid Function Tests: No results for input(s): TSH, T4TOTAL, FREET4, T3FREE, THYROIDAB in the last 72 hours. Anemia Panel: No results for input(s): VITAMINB12, FOLATE, FERRITIN, TIBC, IRON, RETICCTPCT in the last 72 hours. Urine analysis:    Component Value Date/Time   COLORURINE YELLOW 10/20/2015 0827   APPEARANCEUR CLEAR 10/20/2015 0827   APPEARANCEUR Cloudy (A) 09/25/2015 1230   LABSPEC 1.025 10/20/2015 0827    PHURINE 6.0 10/20/2015 0827   GLUCOSEU NEGATIVE 10/20/2015 0827   HGBUR TRACE (A) 10/20/2015 0827   BILIRUBINUR NEGATIVE 10/20/2015 0827   BILIRUBINUR Negative 09/25/2015 1230   KETONESUR NEGATIVE 10/20/2015 0827   PROTEINUR 30 (A) 10/20/2015 0827   NITRITE NEGATIVE 10/20/2015 0827   LEUKOCYTESUR NEGATIVE 10/20/2015 0827   LEUKOCYTESUR 3+ (A) 09/25/2015 1230    Radiological Exams on Admission: DG Chest Port 1 View  Result Date: 04/13/2020 CLINICAL DATA:  Hypoxia. EXAM: PORTABLE CHEST 1 VIEW COMPARISON:  01/14/2006 FINDINGS: There are prominent interstitial lung markings bilaterally with hazy airspace opacities lung bases bilaterally as well as in the right upper lung zone. The heart size is enlarged. There are probable small bilateral pleural effusions without evidence for pneumothorax. Aortic calcifications are noted. There is no acute osseous abnormality. IMPRESSION: Constellation of findings which can be seen in patients with pulmonary edema or an atypical infectious process. Electronically Signed   By: Constance Holster M.D.   On: 04/13/2020 01:36    EKG: I  Assessment/Plan Principal Problem:   Dyspnea due to congestive heart failure (HCC) Dyspnea with exertion in the setting of chest x-ray showing cardiomegaly and suspected pulmonary edema was most likely secondary to acute CHF exacerbation because of breath and dyspnea improved after patient was given 80 mg of IV Lasix.  Ejection fraction was 364.  Patient has no history of heart failure in the past and never had an echocardiogram done. Cardiology consult ordered. Echocardiogram ordered Patient also counseled about using low-salt diet and encouraged to lose some weight. Patient was also having accelerated hypertension and was started on nitroglycerin drip.  Active Problems:   Accelerated hypertension Patient had a blood pressure of 202/124 on arrival to the hospital and she was started on nitroglycerin drip and given a dose of  IV Lasix 80 mg.  Blood pressure during my evaluation is 156/90.  Continue nitroglycerin drip at this time.  Patient will be started on her home medication after nitro drip is discontinued and confirmation of home medications by pharmacy. . Elevated troponin First set of troponin was 39 and the repeated troponin was 82 but patient is continuously denying any chest pain.  Showing sinus tachycardia with left atrial enlargement and left ventricular hypertrophy.  Continue to trend the troponin. Cardiology consult ordered  Hyperglycemia Moderate dose sliding scale insulin ordered. Blood glucose monitoring and hypoglycemic protocol in place.  Acute hypoxic respiratory  failure Patient was severely hypoxic on arrival to the hospital and needed BiPAP.  Acute hypoxia is improved at this time and patient is saturating well on 2 L of oxygen with nasal cannula.  Patient does not use any oxygen at home.  Code Status: Full code Family Communication: Mother is present at the bedside Consults called: Cardiology Admission status: Observation/stepdown   Edmonia Lynch MD [facility] Pager [phone]   If 7PM-7AM, please contact night-coverage [email or web address] Password [email or web address]  04/13/2020, 7:21 AM

## 2020-04-13 NOTE — Progress Notes (Signed)
ANTICOAGULATION CONSULT NOTE - Initial Consult  Pharmacy Consult for heparin Indication: chest pain/ACS  No Known Allergies  Patient Measurements: Height: 5\' 4"  (162.6 cm) Weight: 79.1 kg (174 lb 6.1 oz) IBW/kg (Calculated) : 54.7 Heparin Dosing Weight: 71.6 kg  Vital Signs: Temp: 98 F (36.7 C) (05/20 1141) Temp Source: Oral (05/20 1141) BP: 149/71 (05/20 1130) Pulse Rate: 72 (05/20 1130)  Labs: Recent Labs    04/13/20 0053 04/13/20 0232 04/13/20 0805  HGB 13.5  --   --   HCT 42.8  --   --   PLT 314  --   --   CREATININE 1.11*  --  0.97  TROPONINIHS 39* 82* 344*    Estimated Creatinine Clearance: 64.4 mL/min (by C-G formula based on SCr of 0.97 mg/dL).   Medical History: Past Medical History:  Diagnosis Date  . Diabetes mellitus without complication ([place])   . Diarrhea   . Herpes simplex infection   . Hyperlipidemia   . Hypertension     Assessment: Pharmacy consulted to dose heparin in patient with chest pain/ACS.  Patient is not on anticoagulation prior to admission.  Troponin 82 > 344 CBC WNL  Goal of Therapy:  Heparin level 0.3-0.7 units/ml Monitor platelets by anticoagulation protocol: Yes   Plan:  Give 4000 units bolus x 1 Start heparin infusion at 900 units/hr Check anti-Xa level in 6 hours and daily while on heparin Continue to monitor H&H and platelets  Revonda Standard Hurth 04/13/2020,11:55 AM

## 2020-04-13 NOTE — Progress Notes (Signed)
Foley catheter noted to have some leaking. Dr Joesph Fillers made aware. Foley catheter discontinued per verbal order from Dr Joesph Fillers. Nurse to Nurse report called and given to M. Hawk on [facility]. Patient and family updated on transfer to [facility]. [facility] called. Patient alert and oriented x4 with no complaints of pain, shortness of breath, chest pain, dizziness, nausea or vomiting.

## 2020-04-13 NOTE — Progress Notes (Signed)
Dr. Chancy Milroy paged and made aware that patient is diabetic- glucose on lab work was 382 and no orders for accu checks or insulin ordered. Waiting for call back/orders.

## 2020-04-13 NOTE — Progress Notes (Signed)
CRITICAL VALUE ALERT  Critical Value:  High sensitivity Troponin 344  Date & Time Notied:  04/13/20 @ [id number]  Provider Notified: Joesph Fillers, MD and Cardiologist Branch, MD. Attending RN also made aware.   Orders Received/Actions taken: Awaiting new orders.

## 2020-04-13 NOTE — Progress Notes (Addendum)
Echo shows LVEF 45%, anteroseptal wall severely hypokinetic to kinetic, more pronounced finding than would be seen due to her LBBB alone.. Initially given her severe HTN thought to be HTN emergency with acute pulm edema, however with LBBB, trop uptrend, and echo findings increased concern for ACS. Perhaps the severe HTN was a systemic response to her acute symptoms as opposed to the acute inciting event. Start hep gtt, discus with primary team transfer to [facility] and cath likely tomorrow   Zandra Abts MD

## 2020-04-13 NOTE — Consult Note (Addendum)
Cardiology Consultation:   Patient ID: Michelle Morrison MRN: [id number]; DOB: 02-11-1961  Admit date: 04/13/2020 Date of Consult: 04/13/2020  Primary Care Provider: Rosita Fire, MD Primary Cardiologist: New to Dr. Harl Bowie Primary Electrophysiologist:  None    Patient Profile:   Michelle Morrison is a 59 y.o. female with a hx of DM, HTN, HLD, former tobacco abuse who is being seen today for the evaluation of suspected heart failure at the request of Dr. Humphrey Rolls.  History of Present Illness:   Ms. Fiedor has no prior cardiac history and has been in [place] recently. She went to work and came home as usual yesterday. She was resting talking to a friend on the phone when she suddenly became very hot, short of breath with labored breathing. She felt tight across her chest associated with the dyspnea but no overt chest pain. She tried to breathe through it in hopes that it would resolve but it did not so she called EMS. When they arrived her initial pulse ox was reportedly 38% on RA with diaphoresis and cyanosis. They put her on CPAP and pulse ox improved to 94%. She was markedly hypertensive so received 3 SL NTG. Labs reveal Cr of 1.11, hyponatremia of 134 (corrects to normal given glucose 382), BNP 364, hsTroponin 39-82, lactic acidosis of 5.2, leukocytosis of 12.0, Covid swab negative. CXR with constellation of findings which can be seen in patients with pulmonary edema or an atypical infectious process; enlarged heart and probable small bilateral pleural effusions with aortic calcifications. She was placed on NTG drip and received one dose of IV Lasix 80mg . She is now much more comfortable with normal O2 sat on 2L of [place] - BP nadir was 112/63 overnight and uptrending to the 160s. She otherwise denies any recent ominous symptoms. No exertional angina, fever, chills, cough, weight changes or anything else out of the ordinary. Denies tobacco or illicit drug use.  Initial EKG showed sinus tach with LBBB pattern,  diffuse STTW changes and prolonged QT. EKG in 2012 was also abnormal with prolonged QT although QRS is now wider and there is TWI in I, avL.   Past Medical History:  Diagnosis Date  . Diabetes mellitus without complication ([place])   . Diarrhea   . Herpes simplex infection   . Hyperlipidemia   . Hypertension     Past Surgical History:  Procedure Laterality Date  . CESAREAN SECTION    . CHOLECYSTECTOMY    . COLONOSCOPY N/A 08/26/2014   Procedure: COLONOSCOPY;  Surgeon: [name], MD;  Location: [facility];  Service: Endoscopy;  Laterality: N/A;  9:30 AM - moved to 8:30 - Ginger to notify pt     Home Medications:  Prior to Admission medications   Medication Sig Start Date End Date Taking? Authorizing Provider  amLODipine (NORVASC) 10 MG tablet Take 10 mg by mouth daily.    [provider]  dicyclomine (BENTYL) 10 MG capsule 1 PO 30 MINUTES PRIOR TO BREAKFAST AND LUNCH AND 2 IF NEEDED 03/05/17   Fields, Sandi L, MD  hydrochlorothiazide (MICROZIDE) 12.5 MG capsule Take 12.5 mg by mouth daily.    [provider]  lisinopril (PRINIVIL,ZESTRIL) 40 MG tablet Take 40 mg by mouth daily.    [provider]  simvastatin (ZOCOR) 40 MG tablet Take 40 mg by mouth daily.    [provider]  TRADJENTA 5 MG TABS tablet Take 5 mg by mouth daily.  07/28/16   [provider]  Inpatient Medications: Scheduled Meds: . heparin  5,000 Units Subcutaneous Q8H  . insulin aspart  0-15 Units Subcutaneous TID WC  . insulin aspart  0-5 Units Subcutaneous QHS  . lisinopril  40 mg Oral Daily  . mouth rinse  15 mL Mouth Rinse BID  . sodium chloride flush  3 mL Intravenous Q12H   Continuous Infusions: . sodium chloride    . nitroGLYCERIN Stopped (04/13/20 0651)   PRN Meds: sodium chloride, acetaminophen, ondansetron (ZOFRAN) IV, sodium chloride flush  Allergies:   No Known Allergies  Social History:   Social History   Socioeconomic History  . Marital  status: Single    Spouse name: Not on file  . Number of children: Not on file  . Years of education: Not on file  . Highest education level: Not on file  Occupational History  . Not on file  Tobacco Use  . Smoking status: Former Smoker    Types: Cigarettes  . Smokeless tobacco: Never Used  Substance and Sexual Activity  . Alcohol use: No    Alcohol/week: 0.0 standard drinks  . Drug use: No  . Sexual activity: Not Currently    Birth control/protection: Post-menopausal  Other Topics Concern  . Not on file  Social History Narrative  . Not on file   Social Determinants of Health   Financial Resource Strain:   . Difficulty of Paying Living Expenses:   Food Insecurity:   . Worried About Charity fundraiser in the Last Year:   . Arboriculturist in the Last Year:   Transportation Needs:   . Film/video editor (Medical):   Marland Kitchen Lack of Transportation (Non-Medical):   Physical Activity:   . Days of Exercise per Week:   . Minutes of Exercise per Session:   Stress:   . Feeling of Stress :   Social Connections:   . Frequency of Communication with Friends and Family:   . Frequency of Social Gatherings with Friends and Family:   . Attends Religious Services:   . Active Member of Clubs or Organizations:   . Attends Archivist Meetings:   Marland Kitchen Marital Status:   Intimate Partner Violence:   . Fear of Current or Ex-Partner:   . Emotionally Abused:   Marland Kitchen Physically Abused:   . Sexually Abused:     Family History:    Family History  Problem Relation Age of Onset  . Hypertension Mother   . Diabetes Mother   . Colon cancer Neg Hx      ROS:  Please see the history of present illness.   All other ROS reviewed and negative.     Physical Exam/Data:   Vitals:   04/13/20 0600 04/13/20 0630 04/13/20 0700 04/13/20 0800  BP: 112/63 126/75 (!) 141/73   Pulse: 81 77 77   Resp: 16 16 16    Temp: (!) 97.5 F (36.4 C) (!) 97.5 F (36.4 C) (!) 97.5 F (36.4 C) 97.6 F (36.4  C)  TempSrc: Bladder Bladder  Oral  SpO2: 96% 98% 99%   Weight:      Height:        Intake/Output Summary (Last 24 hours) at 04/13/2020 0814 Last data filed at 04/13/2020 0400 Gross per 24 hour  Intake 242.5 ml  Output 850 ml  Net -607.5 ml   Last 3 Weights 04/13/2020 04/13/2020 01/05/2019  Weight (lbs) 174 lb 6.1 oz 174 lb 2.6 oz 174 lb  Weight (kg) 79.1 kg 79 kg 78.926 kg  Body mass index is 29.93 kg/m.  General: Well developed, well nourished AAF, in no acute distress. Head: Normocephalic, atraumatic, sclera non-icteric, no xanthomas, nares are without discharge. Neck: Negative for carotid bruits. JVP not elevated. Lungs: Diffusely diminished without wheezes, rales, or rhonchi. Breathing is unlabored. Heart: RRR S1 S2 without murmurs, rubs, or gallops.  Abdomen: Soft, non-tender, non-distended with normoactive bowel sounds. No rebound/guarding. Extremities: No clubbing or cyanosis. No edema. Distal pedal pulses are 2+ and equal bilaterally. Neuro: Alert and oriented X 3. Moves all extremities spontaneously. Psych:  Responds to questions appropriately with a normal affect.   EKG:  The EKG was personally reviewed and demonstrates:  sinus tach with LBBB pattern, diffuse STTW changes with probable LVH, J point elevation in I, avL, V1, V2 with deep TWI inferiorly and V4-V6 with QTc 570ms  Telemetry:  Telemetry was personally reviewed and demonstrates:  NSR with brief run of SVT  Relevant CV Studies: None  Laboratory Data:  High Sensitivity Troponin:   Recent Labs  Lab 04/13/20 0053 04/13/20 0232  TROPONINIHS 39* 82*     Chemistry Recent Labs  Lab 04/13/20 0053  NA 134*  K 3.7  CL 100  CO2 21*  GLUCOSE 382*  BUN 15  CREATININE 1.11*  CALCIUM 8.3*  GFRNONAA 55*  GFRAA >60  ANIONGAP 13    Recent Labs  Lab 04/13/20 0053  PROT 7.9  ALBUMIN 3.7  AST 62*  ALT 42  ALKPHOS 115  BILITOT 0.6   Hematology Recent Labs  Lab 04/13/20 0053  WBC 12.0*  RBC  4.61  HGB 13.5  HCT 42.8  MCV 92.8  MCH 29.3  MCHC 31.5  RDW 14.6  PLT 314   BNP Recent Labs  Lab 04/13/20 0053  BNP 364.0*    DDimer No results for input(s): DDIMER in the last 168 hours.   Radiology/Studies:  DG Chest Port 1 View  Result Date: 04/13/2020 CLINICAL DATA:  Hypoxia. EXAM: PORTABLE CHEST 1 VIEW COMPARISON:  01/14/2006 FINDINGS: There are prominent interstitial lung markings bilaterally with hazy airspace opacities lung bases bilaterally as well as in the right upper lung zone. The heart size is enlarged. There are probable small bilateral pleural effusions without evidence for pneumothorax. Aortic calcifications are noted. There is no acute osseous abnormality. IMPRESSION: Constellation of findings which can be seen in patients with pulmonary edema or an atypical infectious process. Electronically Signed   By: Constance Holster M.D.   On: 04/13/2020 01:36    Assessment and Plan:   1. Acute hypoxic respiratory failure suspected secondary to acute heart failure (type not yet determined) - improved quickly with supplemental O2, NTG (SL + drip) then one dose of IV Lasix. 2D echocardiogram is pending. Etiology of abrupt nature is unknown. Will check TSH with labs and discuss renal duplex with MD. Check UA for proteinuria and UDS for completeness. No specific signs of VTE on exam otherwise but will need to keep in differential if she is unable to wean O2 completely. Will discuss next steps with MD.   2. Elevated troponin with new LBBB - she had chest tightness with her acute dyspnea/hypoxia but otherwise does not complain of any recent angina. Given DM and aortic calcification seen on CXR she is at increased risk of underlying CAD. EKG shows progressive LBBB and diffuse STTW changes. Thus far however, troponin seems out of proportion to initial event but f/u value pending. Will give ASA 324mg  this AM and start 81mg  daily otherwise. Check lipids  in AM. Start high intensity statin  given DM and aortic calcification. Pending further review with MD, have relayed EKG result. She did have some degree of QRS widening back in 2012 as well.  3. Accelerated HTN - BP improved but likely needs augmented control - consider beta blocker therapy given other presenting issues. Await echocardiogram to help guide next steps. Remains on NTG drip and lisinopril at this time. See above regarding HTN workup.  4. Prolonged QTc - question due to acute metabolic shifts with distress, although was noted on EKG in 2012 as well. Appeared improved to 417ms by telemetry but EKG shows value is still 534ms. Will obtain f/u BMET/Mg this morning. D/c Zofran - has not received yet. Avoid QT prolonging agents.  5. Diabetes mellitus - per primary team.  6. Brief run of SVT on telemetry - labs pending as above. Consider addition of carvedilol to regimen.   7. AKI - Cr 1.11. Previous value 2018 was 0.70. F/u labs in process.  For questions or updates, please contact [place] Please consult [email or web address] for contact info under     Signed, Charlie Pitter, PA-C  04/13/2020 8:14 AM  Attending note  Patient seen and discussed with PA Dunn, I agree with her documentation. 59 yo female history of HTN, HL< DM2 admitted with SOB. Sudden onset of SOB and some chest heaviness. Reports had been feeling fine over the last several weeks. Yesterday sudden onset of wheezing, SOB. Had some central chest heaviness lasting just a few seconds that resolved, but SOB progressed.   From ER notes severely hypoxic on evaluation by EMS, reportedly O2 sats 38%. In ER initially on bipap, however quickly was able to be weaned to 2L [place]. In ER SBP 200s   K 3.7 Cr 1.11 BUN 15 BNP 364 Lactic acid 5.2-->3.7 WBC 12 Hgb 13.5 Plt 314 TSH 1.277 hstrop 39-->82-->344 EKG initially nonspecific conduction delay, f/u more traditional LBBB COVID neg CXR pulm edema vs atypical infection  Patient presents with flash pulmonary edema in  setting of severe HTN. She reports strict medication compliance Severe hypoxia on presentation that rapidly improved with bp controle arguing against a PE, she is actually on RA now. Was on NG gtt now off. She got 80mg  of IV lasix early this AM and neg 677mL thus far. R estart her home oral regimen with lisinopril 40, norvasc 10, but change HCTZ to chlorthlaidone 12.5mg  daily.. I think this is likely HTN emergency with flash pulmonary edema, from presentation and trop trend do not think her LBBB is acute. Follow trop trend and echo, depending on findings may raise concern for possible ACS. I do not see strong indication for heparin at this time, would start ASA. Pending bp trends on home regimen may warrant further evaluation for secondary HTN, add renin/aldo to labs.    Carlyle Dolly MD

## 2020-04-13 NOTE — Progress Notes (Deleted)
Pt stated, "Give me a beer. As soon as I leave here I am going to stop at the first place I see and get a beer."

## 2020-04-13 NOTE — Progress Notes (Addendum)
  Patient seen and evaluated, chart reviewed, please see EMR for updated orders. Please see full H&P dictated by admitting physician Dr. Humphrey Rolls for same date of service.    Brief Summary:- 59 y.o. female with medical history significant of hypertension, hyperlipidemia and diabetes mellitus admitted on 04/13/2020 with dyspnea and severely elevated BP as well as acute hypoxic respiratory failure--initially required BiPAP    A/p  1)Possible NSTEMI---- Echo shows LVEF 45%, anteroseptal wall severely hypokinetic to kinetic, more pronounced finding than would be seen due to her LBBB alone.. Initially given her severe HTN thought to be HTN emergency with acute pulm edema, however with LBBB, trop uptrend, and echo findings increased concern for ACS. Perhaps the severe HTN was a systemic response to her acute symptoms as opposed to the acute inciting event. Start hep gtt, discus with Dr. Harl Bowie from cardiology service, transfer to [facility] and cath likely 04/14/20 -Continue aspirin and IV heparin, Lipitor and Coreg  2)Hypertensive urgency----initially required nitro drip, BP improved off drip now -Continue amlodipine and Coreg -Hold chlorthalidone -Hold lisinopril pending LHC  3) acute hypoxic respiratory failure--- initially required BiPAP overall improved currently on nasal cannula  4)HFrEF--patient with combined systolic and diastolic dysfunction CHF--Echo with grade 2 diastolic dysfunction and EF of 45% with significant wall motion abnormalities---LHC at [facility] on 04/14/20 --Hold chlorthalidone -Hold lisinopril pending LHC   Social/Ethics--- Discussed with pt and mom , questions answered -Patient is a full code  Patient seen and evaluated, chart reviewed, please see EMR for updated orders. Please see full H&P dictated by admitting physician Dr. Humphrey Rolls for same date of service.    Roxan Hockey, MD

## 2020-04-13 NOTE — H&P (View-Only) (Signed)
Cardiology Consultation:   Patient ID: Michelle Morrison MRN: [id number]; DOB: 09-Aug-1961  Admit date: 04/13/2020 Date of Consult: 04/13/2020  Primary Care Provider: Rosita Fire, MD Primary Cardiologist: New to Dr. Harl Bowie Primary Electrophysiologist:  None    Patient Profile:   Michelle Morrison is a 59 y.o. female with a hx of DM, HTN, HLD, former tobacco abuse who is being seen today for the evaluation of suspected heart failure at the request of Dr. Humphrey Rolls.  History of Present Illness:   Michelle Morrison has no prior cardiac history and has been in [place] recently. She went to work and came home as usual yesterday. She was resting talking to a friend on the phone when she suddenly became very hot, short of breath with labored breathing. She felt tight across her chest associated with the dyspnea but no overt chest pain. She tried to breathe through it in hopes that it would resolve but it did not so she called EMS. When they arrived her initial pulse ox was reportedly 38% on RA with diaphoresis and cyanosis. They put her on CPAP and pulse ox improved to 94%. She was markedly hypertensive so received 3 SL NTG. Labs reveal Cr of 1.11, hyponatremia of 134 (corrects to normal given glucose 382), BNP 364, hsTroponin 39-82, lactic acidosis of 5.2, leukocytosis of 12.0, Covid swab negative. CXR with constellation of findings which can be seen in patients with pulmonary edema or an atypical infectious process; enlarged heart and probable small bilateral pleural effusions with aortic calcifications. She was placed on NTG drip and received one dose of IV Lasix 80mg . She is now much more comfortable with normal O2 sat on 2L of [place] - BP nadir was 112/63 overnight and uptrending to the 160s. She otherwise denies any recent ominous symptoms. No exertional angina, fever, chills, cough, weight changes or anything else out of the ordinary. Denies tobacco or illicit drug use.  Initial EKG showed sinus tach with LBBB pattern,  diffuse STTW changes and prolonged QT. EKG in 2012 was also abnormal with prolonged QT although QRS is now wider and there is TWI in I, avL.   Past Medical History:  Diagnosis Date  . Diabetes mellitus without complication ([place])   . Diarrhea   . Herpes simplex infection   . Hyperlipidemia   . Hypertension     Past Surgical History:  Procedure Laterality Date  . CESAREAN SECTION    . CHOLECYSTECTOMY    . COLONOSCOPY N/A 08/26/2014   Procedure: COLONOSCOPY;  Surgeon: [name], MD;  Location: [facility];  Service: Endoscopy;  Laterality: N/A;  9:30 AM - moved to 8:30 - Ginger to notify pt     Home Medications:  Prior to Admission medications   Medication Sig Start Date End Date Taking? Authorizing Provider  amLODipine (NORVASC) 10 MG tablet Take 10 mg by mouth daily.    [provider]  dicyclomine (BENTYL) 10 MG capsule 1 PO 30 MINUTES PRIOR TO BREAKFAST AND LUNCH AND 2 IF NEEDED 03/05/17   Fields, Sandi L, MD  hydrochlorothiazide (MICROZIDE) 12.5 MG capsule Take 12.5 mg by mouth daily.    [provider]  lisinopril (PRINIVIL,ZESTRIL) 40 MG tablet Take 40 mg by mouth daily.    [provider]  simvastatin (ZOCOR) 40 MG tablet Take 40 mg by mouth daily.    [provider]  TRADJENTA 5 MG TABS tablet Take 5 mg by mouth daily.  07/28/16   [provider]  Inpatient Medications: Scheduled Meds: . heparin  5,000 Units Subcutaneous Q8H  . insulin aspart  0-15 Units Subcutaneous TID WC  . insulin aspart  0-5 Units Subcutaneous QHS  . lisinopril  40 mg Oral Daily  . mouth rinse  15 mL Mouth Rinse BID  . sodium chloride flush  3 mL Intravenous Q12H   Continuous Infusions: . sodium chloride    . nitroGLYCERIN Stopped (04/13/20 0651)   PRN Meds: sodium chloride, acetaminophen, ondansetron (ZOFRAN) IV, sodium chloride flush  Allergies:   No Known Allergies  Social History:   Social History   Socioeconomic History  . Marital  status: Single    Spouse name: Not on file  . Number of children: Not on file  . Years of education: Not on file  . Highest education level: Not on file  Occupational History  . Not on file  Tobacco Use  . Smoking status: Former Smoker    Types: Cigarettes  . Smokeless tobacco: Never Used  Substance and Sexual Activity  . Alcohol use: No    Alcohol/week: 0.0 standard drinks  . Drug use: No  . Sexual activity: Not Currently    Birth control/protection: Post-menopausal  Other Topics Concern  . Not on file  Social History Narrative  . Not on file   Social Determinants of Health   Financial Resource Strain:   . Difficulty of Paying Living Expenses:   Food Insecurity:   . Worried About Charity fundraiser in the Last Year:   . Arboriculturist in the Last Year:   Transportation Needs:   . Film/video editor (Medical):   Marland Kitchen Lack of Transportation (Non-Medical):   Physical Activity:   . Days of Exercise per Week:   . Minutes of Exercise per Session:   Stress:   . Feeling of Stress :   Social Connections:   . Frequency of Communication with Friends and Family:   . Frequency of Social Gatherings with Friends and Family:   . Attends Religious Services:   . Active Member of Clubs or Organizations:   . Attends Archivist Meetings:   Marland Kitchen Marital Status:   Intimate Partner Violence:   . Fear of Current or Ex-Partner:   . Emotionally Abused:   Marland Kitchen Physically Abused:   . Sexually Abused:     Family History:    Family History  Problem Relation Age of Onset  . Hypertension Mother   . Diabetes Mother   . Colon cancer Neg Hx      ROS:  Please see the history of present illness.   All other ROS reviewed and negative.     Physical Exam/Data:   Vitals:   04/13/20 0600 04/13/20 0630 04/13/20 0700 04/13/20 0800  BP: 112/63 126/75 (!) 141/73   Pulse: 81 77 77   Resp: 16 16 16    Temp: (!) 97.5 F (36.4 C) (!) 97.5 F (36.4 C) (!) 97.5 F (36.4 C) 97.6 F (36.4  C)  TempSrc: Bladder Bladder  Oral  SpO2: 96% 98% 99%   Weight:      Height:        Intake/Output Summary (Last 24 hours) at 04/13/2020 0814 Last data filed at 04/13/2020 0400 Gross per 24 hour  Intake 242.5 ml  Output 850 ml  Net -607.5 ml   Last 3 Weights 04/13/2020 04/13/2020 01/05/2019  Weight (lbs) 174 lb 6.1 oz 174 lb 2.6 oz 174 lb  Weight (kg) 79.1 kg 79 kg 78.926 kg  Body mass index is 29.93 kg/m.  General: Well developed, well nourished AAF, in no acute distress. Head: Normocephalic, atraumatic, sclera non-icteric, no xanthomas, nares are without discharge. Neck: Negative for carotid bruits. JVP not elevated. Lungs: Diffusely diminished without wheezes, rales, or rhonchi. Breathing is unlabored. Heart: RRR S1 S2 without murmurs, rubs, or gallops.  Abdomen: Soft, non-tender, non-distended with normoactive bowel sounds. No rebound/guarding. Extremities: No clubbing or cyanosis. No edema. Distal pedal pulses are 2+ and equal bilaterally. Neuro: Alert and oriented X 3. Moves all extremities spontaneously. Psych:  Responds to questions appropriately with a normal affect.   EKG:  The EKG was personally reviewed and demonstrates:  sinus tach with LBBB pattern, diffuse STTW changes with probable LVH, J point elevation in I, avL, V1, V2 with deep TWI inferiorly and V4-V6 with QTc 557ms  Telemetry:  Telemetry was personally reviewed and demonstrates:  NSR with brief run of SVT  Relevant CV Studies: None  Laboratory Data:  High Sensitivity Troponin:   Recent Labs  Lab 04/13/20 0053 04/13/20 0232  TROPONINIHS 39* 82*     Chemistry Recent Labs  Lab 04/13/20 0053  NA 134*  K 3.7  CL 100  CO2 21*  GLUCOSE 382*  BUN 15  CREATININE 1.11*  CALCIUM 8.3*  GFRNONAA 55*  GFRAA >60  ANIONGAP 13    Recent Labs  Lab 04/13/20 0053  PROT 7.9  ALBUMIN 3.7  AST 62*  ALT 42  ALKPHOS 115  BILITOT 0.6   Hematology Recent Labs  Lab 04/13/20 0053  WBC 12.0*  RBC  4.61  HGB 13.5  HCT 42.8  MCV 92.8  MCH 29.3  MCHC 31.5  RDW 14.6  PLT 314   BNP Recent Labs  Lab 04/13/20 0053  BNP 364.0*    DDimer No results for input(s): DDIMER in the last 168 hours.   Radiology/Studies:  DG Chest Port 1 View  Result Date: 04/13/2020 CLINICAL DATA:  Hypoxia. EXAM: PORTABLE CHEST 1 VIEW COMPARISON:  01/14/2006 FINDINGS: There are prominent interstitial lung markings bilaterally with hazy airspace opacities lung bases bilaterally as well as in the right upper lung zone. The heart size is enlarged. There are probable small bilateral pleural effusions without evidence for pneumothorax. Aortic calcifications are noted. There is no acute osseous abnormality. IMPRESSION: Constellation of findings which can be seen in patients with pulmonary edema or an atypical infectious process. Electronically Signed   By: Constance Holster M.D.   On: 04/13/2020 01:36    Assessment and Plan:   1. Acute hypoxic respiratory failure suspected secondary to acute heart failure (type not yet determined) - improved quickly with supplemental O2, NTG (SL + drip) then one dose of IV Lasix. 2D echocardiogram is pending. Etiology of abrupt nature is unknown. Will check TSH with labs and discuss renal duplex with MD. Check UA for proteinuria and UDS for completeness. No specific signs of VTE on exam otherwise but will need to keep in differential if she is unable to wean O2 completely. Will discuss next steps with MD.   2. Elevated troponin with new LBBB - she had chest tightness with her acute dyspnea/hypoxia but otherwise does not complain of any recent angina. Given DM and aortic calcification seen on CXR she is at increased risk of underlying CAD. EKG shows progressive LBBB and diffuse STTW changes. Thus far however, troponin seems out of proportion to initial event but f/u value pending. Will give ASA 324mg  this AM and start 81mg  daily otherwise. Check lipids  in AM. Start high intensity statin  given DM and aortic calcification. Pending further review with MD, have relayed EKG result. She did have some degree of QRS widening back in 2012 as well.  3. Accelerated HTN - BP improved but likely needs augmented control - consider beta blocker therapy given other presenting issues. Await echocardiogram to help guide next steps. Remains on NTG drip and lisinopril at this time. See above regarding HTN workup.  4. Prolonged QTc - question due to acute metabolic shifts with distress, although was noted on EKG in 2012 as well. Appeared improved to 464ms by telemetry but EKG shows value is still 559ms. Will obtain f/u BMET/Mg this morning. D/c Zofran - has not received yet. Avoid QT prolonging agents.  5. Diabetes mellitus - per primary team.  6. Brief run of SVT on telemetry - labs pending as above. Consider addition of carvedilol to regimen.   7. AKI - Cr 1.11. Previous value 2018 was 0.70. F/u labs in process.  For questions or updates, please contact [place] Please consult [email or web address] for contact info under     Signed, Michelle Pitter, Michelle Morrison  04/13/2020 8:14 AM  Attending note  Patient seen and discussed with PA Dunn, I agree with her documentation. 59 yo female history of HTN, HL< DM2 admitted with SOB. Sudden onset of SOB and some chest heaviness. Reports had been feeling fine over the last several weeks. Yesterday sudden onset of wheezing, SOB. Had some central chest heaviness lasting just a few seconds that resolved, but SOB progressed.   From ER notes severely hypoxic on evaluation by EMS, reportedly O2 sats 38%. In ER initially on bipap, however quickly was able to be weaned to 2L [place]. In ER SBP 200s   K 3.7 Cr 1.11 BUN 15 BNP 364 Lactic acid 5.2-->3.7 WBC 12 Hgb 13.5 Plt 314 TSH 1.277 hstrop 39-->82-->344 EKG initially nonspecific conduction delay, f/u more traditional LBBB COVID neg CXR pulm edema vs atypical infection  Patient presents with flash pulmonary edema in  setting of severe HTN. She reports strict medication compliance Severe hypoxia on presentation that rapidly improved with bp controle arguing against a PE, she is actually on RA now. Was on NG gtt now off. She got 80mg  of IV lasix early this AM and neg 682mL thus far. R estart her home oral regimen with lisinopril 40, norvasc 10, but change HCTZ to chlorthlaidone 12.5mg  daily.. I think this is likely HTN emergency with flash pulmonary edema, from presentation and trop trend do not think her LBBB is acute. Follow trop trend and echo, depending on findings may raise concern for possible ACS. I do not see strong indication for heparin at this time, would start ASA. Pending bp trends on home regimen may warrant further evaluation for secondary HTN, add renin/aldo to labs.    Carlyle Dolly MD

## 2020-04-13 NOTE — ED Triage Notes (Signed)
Pt called ems for sob on arrival pt was 38% on room air. Pt was placed on c-pap then 94%. Pt was given 3 nitroglycerin

## 2020-04-14 ENCOUNTER — Encounter (HOSPITAL_COMMUNITY)
Admission: EM | Disposition: A | Discharge status: Home / Self Care | Payer: Self-pay | Source: Home / Self Care | Attending: Internal Medicine

## 2020-04-14 DIAGNOSIS — Z8249 Family history of ischemic heart disease and other diseases of the circulatory system: Secondary | ICD-10-CM | POA: Diagnosis not present

## 2020-04-14 DIAGNOSIS — I509 Heart failure, unspecified: Secondary | ICD-10-CM | POA: Diagnosis not present

## 2020-04-14 DIAGNOSIS — I471 Supraventricular tachycardia: Secondary | ICD-10-CM | POA: Diagnosis not present

## 2020-04-14 DIAGNOSIS — N179 Acute kidney failure, unspecified: Secondary | ICD-10-CM | POA: Diagnosis not present

## 2020-04-14 DIAGNOSIS — J9601 Acute respiratory failure with hypoxia: Secondary | ICD-10-CM

## 2020-04-14 DIAGNOSIS — Z833 Family history of diabetes mellitus: Secondary | ICD-10-CM | POA: Diagnosis not present

## 2020-04-14 DIAGNOSIS — I428 Other cardiomyopathies: Secondary | ICD-10-CM | POA: Diagnosis present

## 2020-04-14 DIAGNOSIS — Z87891 Personal history of nicotine dependence: Secondary | ICD-10-CM | POA: Diagnosis not present

## 2020-04-14 DIAGNOSIS — E785 Hyperlipidemia, unspecified: Secondary | ICD-10-CM | POA: Diagnosis present

## 2020-04-14 DIAGNOSIS — I10 Essential (primary) hypertension: Secondary | ICD-10-CM

## 2020-04-14 DIAGNOSIS — I447 Left bundle-branch block, unspecified: Secondary | ICD-10-CM | POA: Diagnosis present

## 2020-04-14 DIAGNOSIS — R778 Other specified abnormalities of plasma proteins: Secondary | ICD-10-CM | POA: Diagnosis not present

## 2020-04-14 DIAGNOSIS — I5022 Chronic systolic (congestive) heart failure: Secondary | ICD-10-CM

## 2020-04-14 DIAGNOSIS — Z79899 Other long term (current) drug therapy: Secondary | ICD-10-CM | POA: Diagnosis not present

## 2020-04-14 DIAGNOSIS — I5043 Acute on chronic combined systolic (congestive) and diastolic (congestive) heart failure: Secondary | ICD-10-CM | POA: Diagnosis present

## 2020-04-14 DIAGNOSIS — E1165 Type 2 diabetes mellitus with hyperglycemia: Secondary | ICD-10-CM | POA: Diagnosis present

## 2020-04-14 DIAGNOSIS — Z20822 Contact with and (suspected) exposure to covid-19: Secondary | ICD-10-CM | POA: Diagnosis not present

## 2020-04-14 DIAGNOSIS — I110 Hypertensive heart disease with heart failure: Secondary | ICD-10-CM | POA: Diagnosis not present

## 2020-04-14 HISTORY — PX: LEFT HEART CATH AND CORONARY ANGIOGRAPHY: CATH118249

## 2020-04-14 LAB — CBC
HCT: 37.9 % (ref 36.0–46.0)
Hemoglobin: 12.6 g/dL (ref 12.0–15.0)
MCH: 29.9 pg (ref 26.0–34.0)
MCHC: 33.2 g/dL (ref 30.0–36.0)
MCV: 90 fL (ref 80.0–100.0)
Platelets: 281 10*3/uL (ref 150–400)
RBC: 4.21 MIL/uL (ref 3.87–5.11)
RDW: 14.6 % (ref 11.5–15.5)
WBC: 10.1 10*3/uL (ref 4.0–10.5)
nRBC: 0 % (ref 0.0–0.2)

## 2020-04-14 LAB — GLUCOSE, CAPILLARY
Glucose-Capillary: 148 mg/dL — ABNORMAL HIGH (ref 70–99)
Glucose-Capillary: 98 mg/dL (ref 70–99)
Glucose-Capillary: 109 mg/dL — ABNORMAL HIGH (ref 70–99)
Glucose-Capillary: 161 mg/dL — ABNORMAL HIGH (ref 70–99)

## 2020-04-14 LAB — LIPID PANEL
Cholesterol: 172 mg/dL (ref 0–200)
HDL: 62 mg/dL (ref >40)
LDL Cholesterol: 100 mg/dL — ABNORMAL HIGH (ref 0–99)
Total CHOL/HDL Ratio: 2.8 RATIO
Triglycerides: 52 mg/dL (ref <150)
VLDL: 10 mg/dL (ref 0–40)

## 2020-04-14 LAB — BASIC METABOLIC PANEL
Anion gap: 10 (ref 5–15)
BUN: 17 mg/dL (ref 6–20)
CO2: 25 mmol/L (ref 22–32)
Calcium: 8.3 mg/dL — ABNORMAL LOW (ref 8.9–10.3)
Chloride: 95 mmol/L — ABNORMAL LOW (ref 98–111)
Creatinine, Ser: 1.03 mg/dL — ABNORMAL HIGH (ref 0.44–1.00)
GFR calc Af Amer: >60 mL/min (ref >60)
GFR calc non Af Amer: 60 mL/min — ABNORMAL LOW (ref >60)
Glucose, Bld: 149 mg/dL — ABNORMAL HIGH (ref 70–99)
Potassium: 3.6 mmol/L (ref 3.5–5.1)
Sodium: 130 mmol/L — ABNORMAL LOW (ref 135–145)

## 2020-04-14 LAB — TROPONIN I (HIGH SENSITIVITY): Troponin I (High Sensitivity): 159 ng/L (ref <18)

## 2020-04-14 SURGERY — LEFT HEART CATH AND CORONARY ANGIOGRAPHY
Anesthesia: LOCAL

## 2020-04-14 MED ORDER — HEPARIN (PORCINE) IN NACL 1000-0.9 UT/500ML-% IV SOLN
INTRAVENOUS | Status: AC
  Filled 2020-04-14: qty 1000

## 2020-04-14 MED ORDER — LIDOCAINE HCL (PF) 1 % IJ SOLN
INTRAMUSCULAR | Status: DC | PRN
  Administered 2020-04-14: 2 mL

## 2020-04-14 MED ORDER — HEPARIN SODIUM (PORCINE) 1000 UNIT/ML IJ SOLN
INTRAMUSCULAR | Status: DC | PRN
  Administered 2020-04-14: 4000 [IU] via INTRAVENOUS

## 2020-04-14 MED ORDER — HEPARIN (PORCINE) IN NACL 1000-0.9 UT/500ML-% IV SOLN
INTRAVENOUS | Status: DC | PRN
  Administered 2020-04-14 (×2): 500 mL

## 2020-04-14 MED ORDER — ONDANSETRON HCL 4 MG/2ML IJ SOLN: 4.0000 mg | Freq: Four times a day (QID) | INTRAMUSCULAR | Status: DC | PRN

## 2020-04-14 MED ORDER — FENTANYL CITRATE (PF) 100 MCG/2ML IJ SOLN
INTRAMUSCULAR | Status: DC | PRN
  Administered 2020-04-14: 25 ug via INTRAVENOUS

## 2020-04-14 MED ORDER — LOSARTAN POTASSIUM 50 MG PO TABS
50.0000 mg | ORAL_TABLET | Freq: Every day | ORAL | Status: DC
  Administered 2020-04-15: 50 mg via ORAL
  Filled 2020-04-14: qty 1

## 2020-04-14 MED ORDER — ACETAMINOPHEN 325 MG PO TABS: 650.0000 mg | ORAL_TABLET | ORAL | Status: DC | PRN

## 2020-04-14 MED ORDER — HEPARIN SODIUM (PORCINE) 1000 UNIT/ML IJ SOLN
INTRAMUSCULAR | Status: AC
  Filled 2020-04-14: qty 1

## 2020-04-14 MED ORDER — SODIUM CHLORIDE 0.9% FLUSH: 3.0000 mL | INTRAVENOUS | Status: DC | PRN

## 2020-04-14 MED ORDER — VERAPAMIL HCL 2.5 MG/ML IV SOLN
INTRAVENOUS | Status: DC | PRN
  Administered 2020-04-14: 10 mL via INTRA_ARTERIAL

## 2020-04-14 MED ORDER — SODIUM CHLORIDE 0.9% FLUSH
3.0000 mL | Freq: Two times a day (BID) | INTRAVENOUS | Status: DC
  Administered 2020-04-14: 3 mL via INTRAVENOUS

## 2020-04-14 MED ORDER — IOHEXOL 350 MG/ML SOLN
INTRAVENOUS | Status: DC | PRN
  Administered 2020-04-14: 70 mL

## 2020-04-14 MED ORDER — SODIUM CHLORIDE 0.9 % WEIGHT BASED INFUSION
1.0000 mL/kg/h | INTRAVENOUS | Status: AC
  Administered 2020-04-14: 1 mL/kg/h via INTRAVENOUS

## 2020-04-14 MED ORDER — VERAPAMIL HCL 2.5 MG/ML IV SOLN
INTRAVENOUS | Status: AC
  Filled 2020-04-14: qty 2

## 2020-04-14 MED ORDER — SODIUM CHLORIDE 0.9 % IV SOLN: 250.0000 mL | INTRAVENOUS | Status: DC | PRN

## 2020-04-14 MED ORDER — POTASSIUM CHLORIDE CRYS ER 20 MEQ PO TBCR
20.0000 meq | EXTENDED_RELEASE_TABLET | Freq: Once | ORAL | Status: AC
  Administered 2020-04-14: 20 meq via ORAL
  Filled 2020-04-14: qty 1

## 2020-04-14 MED ORDER — FENTANYL CITRATE (PF) 100 MCG/2ML IJ SOLN
INTRAMUSCULAR | Status: AC
  Filled 2020-04-14: qty 2

## 2020-04-14 MED ORDER — LIDOCAINE HCL (PF) 1 % IJ SOLN
INTRAMUSCULAR | Status: AC
  Filled 2020-04-14: qty 30

## 2020-04-14 MED ORDER — MIDAZOLAM HCL 2 MG/2ML IJ SOLN
INTRAMUSCULAR | Status: DC | PRN
  Administered 2020-04-14: 1 mg via INTRAVENOUS

## 2020-04-14 MED ORDER — MIDAZOLAM HCL 2 MG/2ML IJ SOLN
INTRAMUSCULAR | Status: AC
  Filled 2020-04-14: qty 2

## 2020-04-14 SURGICAL SUPPLY — 11 items
CATH 5FR JL3.5 JR4 ANG PIG MP (CATHETERS) ×1 IMPLANT
CATH LAUNCHER 5F JL3 (CATHETERS) IMPLANT
CATHETER LAUNCHER 5F JL3 (CATHETERS) ×2
DEVICE RAD COMP TR BAND LRG (VASCULAR PRODUCTS) ×1 IMPLANT
GLIDESHEATH SLEND SS 6F .021 (SHEATH) ×1 IMPLANT
GUIDEWIRE INQWIRE 1.5J.035X260 (WIRE) IMPLANT
INQWIRE 1.5J .035X260CM (WIRE) ×2
KIT HEART LEFT (KITS) ×2 IMPLANT
PACK CARDIAC CATHETERIZATION (CUSTOM PROCEDURE TRAY) ×2 IMPLANT
TRANSDUCER W/STOPCOCK (MISCELLANEOUS) ×2 IMPLANT
TUBING CIL FLEX 10 FLL-RA (TUBING) ×2 IMPLANT

## 2020-04-14 NOTE — Progress Notes (Signed)
Pt arrived on the unit from [facility]. AOx4. No complains of chest pain. VS taken. Tele monitor in place. Pt's brother updated. All questions answered. Will monitor.

## 2020-04-14 NOTE — Progress Notes (Signed)
PROGRESS NOTE    Michelle Morrison  [id number] DOB: October 18, 1961 DOA: 04/13/2020 PCP: Rosita Fire, MD    Chief Complaint  Patient presents with  . Respiratory Distress    Brief Narrative:   59 year old lady with prior history of hypertension, hyperlipidemia, former tobacco abuse, diabetes mellitus she was initially admitted to [facility] for shortness of breath was found to have acute respiratory failure with hypoxia secondary to acute heart failure in addition to accelerated hypertension.  Further work-up showed elevated troponin.  Left bundle branch block EKG.  Echocardiogram was done and showed left ventricular ejection fraction of 45% with moderate LVH and grade 2 diastolic dysfunction.  Patient was transferred to health  Assessment & Plan:   Principal Problem:   Dyspnea due to congestive heart failure (HCC) Active Problems:   Accelerated hypertension   Hyperglycemia due to diabetes mellitus (HCC)   Elevated troponin      Acute on chronic diastolic heart failure Much improved.    Patient underwent cardiac catheterization on 04/14/2020 reveals LV end diastolic pressure is normal.   Normal coronary anatomy. Normal LVEDP 10 mm Hg Cardiology recommends medical management with BP control.  Resume aspirin 81 mg, Lipitor 80 mg daily, Coreg 3.125 mg twice daily   Accelerated hypertension Blood pressure parameters improved. Resume Coreg and Cozaar 50 mg daily.  DC amlodipine.     Diabetes mellitus with hyperglycemia Continue with sliding scale insulin. Hemoglobin A1c at 7.3. CBG (last 3)  Recent Labs    04/13/20 1636 04/13/20 2155 04/14/20 0607  GLUCAP 123* 166* 148*    AKI Probably from IV diuresis.    Prolonged QTC Keep potassium greater than 4 and magnesium greater than 2.   Hyperlipidemia:  Resume lipitor.  LDL is 100.    DVT prophylaxis: Lovenox  code Status: Full code Family Communication: None at bedside Disposition:   Status is:  Observation  The patient will require care spanning > 2 midnights and should be moved to inpatient because: Unsafe d/c plan  Dispo: The patient is from: Home              Anticipated d/c is to: Home              Anticipated d/c date is: 1 day              Patient currently is not medically stable to d/c.       Consultants:   Cardiology   Procedures: Cardiac catheterization 04/14/2020 showing normal coronaries  Antimicrobials: none.   Subjective: No chest pain or sob, no nausea, or vomiting.   Objective: Vitals:   04/14/20 0804 04/14/20 0809 04/14/20 0829 04/14/20 0903  BP: (!) 145/67 125/63 (!) 101/44 (!) 110/54  Pulse: 76 74 65   Resp: (!) 35 18 16   Temp:   97.6 F (36.4 C)   TempSrc:   Oral   SpO2: 96% (!) 89% 94%   Weight:      Height:        Intake/Output Summary (Last 24 hours) at 04/14/2020 1158 Last data filed at 04/14/2020 0959 Gross per 24 hour  Intake 680.17 ml  Output 1500 ml  Net -819.83 ml   Filed Weights   04/13/20 0055 04/13/20 0342 04/14/20 0436  Weight: 79 kg 79.1 kg 53.1 kg    Examination:  General exam: Appears calm and comfortable  Respiratory system: Clear to auscultation. Respiratory effort normal. Cardiovascular system: S1 & S2 heard, RRR. No JVD,. No pedal edema. Gastrointestinal  system: Abdomen is nondistended, soft and nontender.  Normal bowel sounds heard. Central nervous system: Alert and oriented. No focal neurological deficits. Extremities: Symmetric 5 x 5 power. Skin: No rashes, lesions or ulcers Psychiatry:  Mood & affect appropriate.     Data Reviewed: I have personally reviewed following labs and imaging studies  CBC: Recent Labs  Lab 04/13/20 0053 04/14/20 0903  WBC 12.0* 10.1  NEUTROABS 6.4  --   HGB 13.5 12.6  HCT 42.8 37.9  MCV 92.8 90.0  PLT 314 [id number]    Basic Metabolic Panel: Recent Labs  Lab 04/13/20 0053 04/13/20 0805 04/14/20 0903  NA 134* 138 130*  K 3.7 4.5 3.6  CL 100 98 95*  CO2 21* 27  25  GLUCOSE 382* 157* 149*  BUN 15 19 17   CREATININE 1.11* 0.97 1.03*  CALCIUM 8.3* 8.9 8.3*  MG  --  2.0  --     GFR: Estimated Creatinine Clearance: 49.9 mL/min (A) (by C-G formula based on SCr of 1.03 mg/dL (H)).  Liver Function Tests: Recent Labs  Lab 04/13/20 0053  AST 62*  ALT 42  ALKPHOS 115  BILITOT 0.6  PROT 7.9  ALBUMIN 3.7    CBG: Recent Labs  Lab 04/13/20 0812 04/13/20 1114 04/13/20 1636 04/13/20 2155 04/14/20 0607  GLUCAP 134* 144* 123* 166* 148*     Recent Results (from the past 240 hour(s))  SARS Coronavirus 2 by RT PCR (hospital order, performed in [facility] hospital lab) Nasopharyngeal Nasopharyngeal Swab     Status: None   Collection Time: 04/13/20 12:54 AM   Specimen: Nasopharyngeal Swab  Result Value Ref Range Status   SARS Coronavirus 2 NEGATIVE NEGATIVE Final    Comment: (NOTE) SARS-CoV-2 target nucleic acids are NOT DETECTED. The SARS-CoV-2 RNA is generally detectable in upper and lower respiratory specimens during the acute phase of infection. The lowest concentration of SARS-CoV-2 viral copies this assay can detect is 250 copies / mL. A negative result does not preclude SARS-CoV-2 infection and should not be used as the sole basis for treatment or other patient management decisions.  A negative result may occur with improper specimen collection / handling, submission of specimen other than nasopharyngeal swab, presence of viral mutation(s) within the areas targeted by this assay, and inadequate number of viral copies (<250 copies / mL). A negative result must be combined with clinical observations, patient history, and epidemiological information. Fact Sheet for Patients:   StrictlyIdeas.no Fact Sheet for Healthcare Providers: BankingDealers.co.za This test is not yet approved or cleared  by the [place] FDA and has been authorized for detection and/or diagnosis of SARS-CoV-2  by FDA under an Emergency Use Authorization (EUA).  This EUA will remain in effect (meaning this test can be used) for the duration of the COVID-19 declaration under Section 564(b)(1) of the Act, 21 U.S.C. section 360bbb-3(b)(1), unless the authorization is terminated or revoked sooner. Performed at [facility], [address] [place], [place] [address]   MRSA PCR Screening     Status: None   Collection Time: 04/13/20  3:33 AM   Specimen: Nasal Mucosa; Nasopharyngeal  Result Value Ref Range Status   MRSA by PCR NEGATIVE NEGATIVE Final    Comment:        The GeneXpert MRSA Assay (FDA approved for NASAL specimens only), is one component of a comprehensive MRSA colonization surveillance program. It is not intended to diagnose MRSA infection nor to guide or monitor treatment for MRSA infections. Performed at [facility]  [facility], [address] [place], [place] [address]          Radiology Studies: CARDIAC CATHETERIZATION  Result Date: 0000000  LV end diastolic pressure is normal.  1. Normal coronary anatomy. 2. Normal LVEDP 10 mm Hg Plan: medical management with BP control. Anticipate DC today.   DG Chest Port 1 View  Result Date: 04/13/2020 CLINICAL DATA:  Hypoxia. EXAM: PORTABLE CHEST 1 VIEW COMPARISON:  01/14/2006 FINDINGS: There are prominent interstitial lung markings bilaterally with hazy airspace opacities lung bases bilaterally as well as in the right upper lung zone. The heart size is enlarged. There are probable small bilateral pleural effusions without evidence for pneumothorax. Aortic calcifications are noted. There is no acute osseous abnormality. IMPRESSION: Constellation of findings which can be seen in patients with pulmonary edema or an atypical infectious process. Electronically Signed   By: Constance Holster M.D.   On: 04/13/2020 01:36   ECHOCARDIOGRAM COMPLETE  Result Date: 04/13/2020    ECHOCARDIOGRAM REPORT   [name] Date of Exam:  04/13/2020 Medical Rec #:  [id number]      Height:       64.0 in Accession #:    [id number]     Weight:       174.4 lb Date of Birth:  1960/12/02      BSA:          1.846 m Patient Age:    37 years       BP:           141/73 mmHg Patient Gender: F              HR:           86 bpm. Exam Location:  [facility] Procedure: 2D Echo, Cardiac Doppler and Color Doppler Indications:    R06.02 SOB  History:        Patient has no prior history of Echocardiogram examinations.                 CHF, Abnormal ECG, Arrythmias:LBBB, Signs/Symptoms:Dyspnea; Risk                 Factors:Hypertension, Diabetes and Former Smoker. Cyanosis.  Sonographer:    Roseanna Rainbow RDCS (AE) Referring Phys: [id number] [facility]  Sonographer Comments: Patient is morbidly obese. Image acquisition challenging due to patient body habitus. IMPRESSIONS  1. Right ventricular systolic function is normal. The right ventricular size is normal.  2. The anteroseptal wall is severely hypokinetic to akinetic.More pronounced finding that would be seen with her LBBB alone. Left ventricular ejection fraction, by estimation, is 45%. The left ventricle has mildly decreased function. The left ventricle demonstrates regional wall motion abnormalities (see scoring diagram/findings for description). There is moderate left ventricular hypertrophy. Left ventricular diastolic parameters are consistent with Grade II diastolic dysfunction (pseudonormalization).  3. Left atrial size was mildly dilated.  4. The mitral valve is normal in structure. No evidence of mitral valve regurgitation. No evidence of mitral stenosis.  5. The aortic valve is tricuspid. Aortic valve regurgitation is not visualized. No aortic stenosis is present.  6. The inferior vena cava is normal in size with greater than 50% respiratory variability, suggesting right atrial pressure of 3 mmHg. FINDINGS  Left Ventricle: The anteroseptal wall is severely hypokinetic to akinetic. Left ventricular ejection  fraction, by estimation, is 45%. The left ventricle has mildly decreased function. The left ventricle demonstrates regional wall motion abnormalities. The left ventricular internal cavity  size was normal in size. There is moderate left ventricular hypertrophy. Left ventricular diastolic parameters are consistent with Grade II diastolic dysfunction (pseudonormalization). Right Ventricle: The right ventricular size is normal. No increase in right ventricular wall thickness. Right ventricular systolic function is normal. Left Atrium: Left atrial size was mildly dilated. Right Atrium: Right atrial size was normal in size. Pericardium: There is no evidence of pericardial effusion. Mitral Valve: The mitral valve is normal in structure. No evidence of mitral valve regurgitation. No evidence of mitral valve stenosis. Tricuspid Valve: The tricuspid valve is normal in structure. Tricuspid valve regurgitation is trivial. No evidence of tricuspid stenosis. Aortic Valve: The aortic valve is tricuspid. Aortic valve regurgitation is not visualized. No aortic stenosis is present. Aortic valve mean gradient measures 4.0 mmHg. Aortic valve peak gradient measures 7.6 mmHg. Aortic valve area, by VTI measures 1.87 cm. Pulmonic Valve: The pulmonic valve was not well visualized. Pulmonic valve regurgitation is not visualized. No evidence of pulmonic stenosis. Aorta: The aortic root is normal in size and structure. Pulmonary Artery: Indeterminant PASP, inadequate TR jet. Venous: The inferior vena cava is normal in size with greater than 50% respiratory variability, suggesting right atrial pressure of 3 mmHg. IAS/Shunts: No atrial level shunt detected by color flow Doppler.  LEFT VENTRICLE PLAX 2D LVIDd:         3.86 cm     Diastology LVIDs:         2.92 cm     LV e' lateral:   5.22 cm/s LV PW:         1.30 cm     LV E/e' lateral: 17.4 LV IVS:        1.30 cm     LV e' medial:    4.05 cm/s LVOT diam:     1.90 cm     LV E/e' medial:  22.4 LV  SV:         56 LV SV Index:   30 LVOT Area:     2.84 cm  LV Volumes (MOD) LV vol d, MOD A2C: 61.5 ml LV vol d, MOD A4C: 69.2 ml LV vol s, MOD A2C: 44.0 ml LV vol s, MOD A4C: 34.9 ml LV SV MOD A2C:     17.5 ml LV SV MOD A4C:     69.2 ml LV SV MOD BP:      28.5 ml RIGHT VENTRICLE            IVC RV S prime:     8.59 cm/s  IVC diam: 1.28 cm TAPSE (M-mode): 1.8 cm LEFT ATRIUM             Index       RIGHT ATRIUM           Index LA diam:        4.50 cm 2.44 cm/m  RA Area:     11.20 cm LA Vol (A2C):   46.6 ml 25.25 ml/m RA Volume:   19.50 ml  10.57 ml/m LA Vol (A4C):   62.7 ml 33.97 ml/m LA Biplane Vol: 53.8 ml 29.15 ml/m  AORTIC VALVE AV Area (Vmax):    2.04 cm AV Area (Vmean):   1.98 cm AV Area (VTI):     1.87 cm AV Vmax:           138.16 cm/s AV Vmean:          94.858 cm/s AV VTI:            0.297  m AV Peak Grad:      7.6 mmHg AV Mean Grad:      4.0 mmHg LVOT Vmax:         99.20 cm/s LVOT Vmean:        66.300 cm/s LVOT VTI:          0.196 m LVOT/AV VTI ratio: 0.66  AORTA Ao Root diam: 2.70 cm Ao Asc diam:  2.70 cm MITRAL VALVE MV Area (PHT): 4.89 cm    SHUNTS MV Decel Time: 155 msec    Systemic VTI:  0.20 m MV E velocity: 90.80 cm/s  Systemic Diam: 1.90 cm MV A velocity: 68.90 cm/s MV E/A ratio:  1.32 Carlyle Dolly MD Electronically signed by Carlyle Dolly MD Signature Date/Time: 04/13/2020/11:50:58 AM    Final         Scheduled Meds: . aspirin  81 mg Oral Daily  . atorvastatin  80 mg Oral Daily  . carvedilol  3.125 mg Oral BID WC  . Chlorhexidine Gluconate Cloth  6 each Topical Daily  . insulin aspart  0-15 Units Subcutaneous TID WC  . insulin aspart  0-5 Units Subcutaneous QHS  . isosorbide mononitrate  30 mg Oral Daily  . [START ON 04/15/2020] losartan  50 mg Oral Daily  . mouth rinse  15 mL Mouth Rinse BID  . sodium chloride flush  3 mL Intravenous Q12H  . sodium chloride flush  3 mL Intravenous Q12H  . sodium chloride flush  3 mL Intravenous Q12H   Continuous Infusions: . sodium  chloride    . sodium chloride    . sodium chloride 1 mL/kg/hr (04/14/20 0857)     LOS: 0 days        Hosie Poisson, MD [facility]   To contact the attending provider between 7A-7P or the covering provider during after hours 7P-7A, please log into the web site [email or web address] and access using universal  password for that web site. If you do not have the password, please call the hospital operator.  04/14/2020, 11:58 AM

## 2020-04-14 NOTE — Progress Notes (Signed)
Per CCMD, pt had a non-sustained HR of 45, pt asymptomatic. Current HR 69. No complains at this time. Will continue to monitor

## 2020-04-14 NOTE — Progress Notes (Signed)
Troponin 159 this am. Pt has no complains of chest pain overnight. Will relay to day team.

## 2020-04-14 NOTE — Plan of Care (Signed)

## 2020-04-14 NOTE — Evaluation (Signed)
Physical Therapy Evaluation Patient Details Name: Michelle Morrison MRN: [id number] DOB: 21-Jul-1961 Today's Date: 04/14/2020   History of Present Illness  59 yo female with onset of severe SOB and heaviness in legs was admitted to hosp.  Had hypoxia and low O2 sat 38% upon arrival, covid (-), given bipap with nasal cannula and recovered sats.  Pt received heart cath on 5/21 after dx of heart failure, elevated troponin and new L BBB, EF 45%.  PMHx:  HTN, DM, SVT, AKI, tobacco abuse,   Clinical Impression  Pt was seen for mobility of gait with HHA, and note her stability with BP in pregait check, O2 sats up to 96% with effort.  Her plan is to go home with family to assist her, with all expectations to be doing well.  Her R wrist is restricted for a few days, and will get her up stairs tomorrow with all expectation of a quick therapy session.  Follow up and may be ready to DC after that.    Follow Up Recommendations Home health PT;Supervision for mobility/OOB    Equipment Recommendations  None recommended by PT    Recommendations for Other Services       Precautions / Restrictions Precautions Precautions: Fall Precaution Comments: no flexion of R wrist Restrictions Weight Bearing Restrictions: No Other Position/Activity Restrictions: no flexion R wrist      Mobility  Bed Mobility Overal bed mobility: Needs Assistance Bed Mobility: Supine to Sit;Sit to Supine     Supine to sit: Min assist Sit to supine: Min assist   General bed mobility comments: min assist to avoid using R hand getting in and out of bed  Transfers Overall transfer level: Needs assistance Equipment used: 1 person hand held assist Transfers: Sit to/from Stand Sit to Stand: Min guard         General transfer comment: min guard to support the effort and steady pt  Ambulation/Gait Ambulation/Gait assistance: Min guard Gait Distance (Feet): 140 Feet Assistive device: 1 person hand held assist Gait  Pattern/deviations: Step-through pattern;Decreased stride length;Wide base of support Gait velocity: reduced Gait velocity interpretation: <1.31 ft/sec, indicative of household ambulator General Gait Details: pt is mildly unsteady but no outright LOB, has been to heart cath today  Stairs            Wheelchair Mobility    Modified Rankin (Stroke Patients Only)       Balance Overall balance assessment: Needs assistance Sitting-balance support: Feet supported Sitting balance-Leahy Scale: Good     Standing balance support: Single extremity supported Standing balance-Leahy Scale: Fair Standing balance comment: fair dynamic balance but did not attempt stairs                             Pertinent Vitals/Pain Pain Assessment: No/denies pain    Home Living Family/patient expects to be discharged to:: Private residence Living Arrangements: Children Available Help at Discharge: Family;Available PRN/intermittently Type of Home: Apartment Home Access: Stairs to enter Entrance Stairs-Rails: Right;Left;Can reach both Entrance Stairs-Number of Steps: 13 Home Layout: One level Home Equipment: None Additional Comments: has not needed a walker previously    Prior Function Level of Independence: Independent         Comments: home with daughter who works     Engineer, [employer]   Dominant Hand: Right    Extremity/Trunk Assessment   Upper Extremity Assessment Upper Extremity Assessment: RUE deficits/detail RUE Deficits / Details: R arm restrictions  from cath today RUE Coordination: decreased gross motor    Lower Extremity Assessment Lower Extremity Assessment: RLE deficits/detail RLE Deficits / Details: mild RLE weakness RLE Coordination: decreased gross motor    Cervical / Trunk Assessment Cervical / Trunk Assessment: Normal  Communication   Communication: Expressive difficulties(mild speech changes)  Cognition Arousal/Alertness: Awake/alert Behavior  During Therapy: WFL for tasks assessed/performed Overall Cognitive Status: No family/caregiver present to determine baseline cognitive functioning                                 General Comments: repetitive cues needed to avoid using R wrist in flexed posure      General Comments General comments (skin integrity, edema, etc.): pt was seen for evaluation of mobility with HR and sats controlled during efort, monitored orthostatics with supine 118/63, sittting 124/65, standing 118/67    Exercises     Assessment/Plan    PT Assessment Patient needs continued PT services  PT Problem List Decreased strength;Decreased activity tolerance;Decreased balance;Cardiopulmonary status limiting activity       PT Treatment Interventions DME instruction;Gait training;Stair training;Functional mobility training;Therapeutic activities;Therapeutic exercise;Balance training;Neuromuscular re-education;Patient/family education    PT Goals (Current goals can be found in the Care Plan section)  Acute Rehab PT Goals Patient Stated Goal: to get home tomorrow PT Goal Formulation: With patient/family Time For Goal Achievement: 04/21/20 Potential to Achieve Goals: Good    Frequency Min 3X/week   Barriers to discharge Inaccessible home environment home with full flight of stairs to enter house    Co-evaluation               AM-PAC PT "6 Clicks" Mobility  Outcome Measure Help needed turning from your back to your side while in a flat bed without using bedrails?: None Help needed moving from lying on your back to sitting on the side of a flat bed without using bedrails?: None Help needed moving to and from a bed to a chair (including a wheelchair)?: A Little Help needed standing up from a chair using your arms (e.g., wheelchair or bedside chair)?: A Little Help needed to walk in hospital room?: A Little Help needed climbing 3-5 steps with a railing? : A Lot 6 Click Score: 19    End  of Session Equipment Utilized During Treatment: Gait belt;Other (comment)(O2 sat monitor) Activity Tolerance: Patient limited by fatigue Patient left: in bed;with call bell/phone within reach;with bed alarm set;with family/visitor present Nurse Communication: Mobility status PT Visit Diagnosis: Unsteadiness on feet (R26.81)    Time: [id number] PT Time Calculation (min) (ACUTE ONLY): 32 min   Charges:   PT Evaluation $PT Eval Moderate Complexity: 1 Mod PT Treatments $Gait Training: 8-22 mins       Ramond Dial 04/14/2020, 4:32 PM  Mee Hives, PT MS Acute Rehab Dept. Number: [place] and [place]

## 2020-04-14 NOTE — Interval H&P Note (Signed)
History and Physical Interval Note:  04/14/2020 7:34 AM  Jerrell Mylar  has presented today for surgery, with the diagnosis of systolic hf.  The various methods of treatment have been discussed with the patient and family. After consideration of risks, benefits and other options for treatment, the patient has consented to  Procedure(s): LEFT HEART CATH AND CORONARY ANGIOGRAPHY (N/A) as a surgical intervention.  The patient's history has been reviewed, patient examined, no change in status, stable for surgery.  I have reviewed the patient's chart and labs.  Questions were answered to the patient's satisfaction.    Cath Lab Visit (complete for each Cath Lab visit)  Clinical Evaluation Leading to the Procedure:   ACS: Yes.    Non-ACS:    Anginal Classification: CCS III  Anti-ischemic medical therapy: Minimal Therapy (1 class of medications)  Non-Invasive Test Results: No non-invasive testing performed  Prior CABG: No previous CABG       Collier Salina [facility] 04/14/2020 7:34 AM

## 2020-04-14 NOTE — TOC Progression Note (Signed)
Transition of Care [facility]) - Progression Note    Patient Details  Name: Michelle Morrison MRN: [id number] Date of Birth: [birth year]  [facility]) CM/SW Contact  Zenon Mayo, RN Phone Number: 04/14/2020, 4:18 PM  Clinical Narrative:    NCM spoke with patient, asked if she would like a HHRN for CHF disease management , she states she is not homebound .  She will not qualify for [facility], she is still working and driving and plans to go back to work.  She states she has no issues with getting her medications. She has transportation at discharge.        Expected Discharge Plan and Services                                                 Social Determinants of Health (SDOH) Interventions    Readmission Risk Interventions No flowsheet data found.

## 2020-04-14 NOTE — Progress Notes (Signed)
  Pt was transferred to [facility] campus 04/13/20  -Spoke with Flow Manager Larene Beach who will assign pt to a [place] [facility] at [facility] as pt has Not been at [facility] since 04/13/20   Cardiology is consulting  [place] is Primary  -Pt is currently going to Cath Lab for [facility]   Roxan Hockey, MD

## 2020-04-14 NOTE — Progress Notes (Addendum)
Progress Note  Patient Name: Michelle Morrison Date of Encounter: 04/14/2020  Primary Cardiologist: Carlyle Dolly, MD   Subjective   59 y.o. female with a hx of DM, HTN, HLD, former tobacco abuse, no prior cardiac hx.  Admitted to [facility], with acute hypoxic respiratory failure due to acute HF in combination with accelerated HTN, improved,  She has LBBB and elevated troponin - echo was done and EF 45%, moderate LVH, G2DD.  [facility] for further eval with cardiac cath.   Today post cath no complaints  Inpatient Medications    Scheduled Meds: . amLODipine  10 mg Oral Daily  . aspirin  81 mg Oral Daily  . atorvastatin  80 mg Oral Daily  . carvedilol  3.125 mg Oral BID WC  . Chlorhexidine Gluconate Cloth  6 each Topical Daily  . insulin aspart  0-15 Units Subcutaneous TID WC  . insulin aspart  0-5 Units Subcutaneous QHS  . isosorbide mononitrate  30 mg Oral Daily  . mouth rinse  15 mL Mouth Rinse BID  . sodium chloride flush  3 mL Intravenous Q12H  . sodium chloride flush  3 mL Intravenous Q12H  . sodium chloride flush  3 mL Intravenous Q12H   Continuous Infusions: . sodium chloride    . sodium chloride    . sodium chloride 1 mL/kg/hr (04/14/20 0857)   PRN Meds: sodium chloride, sodium chloride, acetaminophen, labetalol, ondansetron (ZOFRAN) IV, sodium chloride flush, sodium chloride flush   Vital Signs    Vitals:   04/14/20 0759 04/14/20 0804 04/14/20 0809 04/14/20 0829  BP: 138/63 (!) 145/67 125/63 (!) 101/44  Pulse: 81 76 74 65  Resp: 18 (!) 35 18 16  Temp:    97.6 F (36.4 C)  TempSrc:    Oral  SpO2: 96% 96% (!) 89% 94%  Weight:      Height:        Intake/Output Summary (Last 24 hours) at 04/14/2020 0900 Last data filed at 04/14/2020 0436 Gross per 24 hour  Intake 320.17 ml  Output 1500 ml  Net -1179.83 ml   Last 3 Weights 04/14/2020 04/13/2020 04/13/2020  Weight (lbs) 117 lb 1.6 oz 174 lb 6.1 oz 174 lb 2.6 oz  Weight (kg) 53.116 kg 79.1 kg 79 kg       Telemetry    SR to SB -Junct rhythm at 45  - Personally Reviewed  ECG    Improved compared to admit with LVH, LBBB QtC of 531 and LPFB - Personally Reviewed  Physical Exam   GEN: No acute distress.   Neck: No JVD Cardiac: RRR, no murmurs, rubs, or gallops. Rt wrist with clamp post cath Respiratory: Clear to diminished to auscultation bilaterally. GI: Soft, nontender, non-distended  MS: No edema; No deformity. Neuro:  Nonfocal  Psych: Normal affect   Labs    High Sensitivity Troponin:   Recent Labs  Lab 04/13/20 0053 04/13/20 0232 04/13/20 0805 04/14/20 0506  TROPONINIHS 39* 82* 344* 159*      Chemistry Recent Labs  Lab 04/13/20 0053 04/13/20 0805  NA 134* 138  K 3.7 4.5  CL 100 98  CO2 21* 27  GLUCOSE 382* 157*  BUN 15 19  CREATININE 1.11* 0.97  CALCIUM 8.3* 8.9  PROT 7.9  --   ALBUMIN 3.7  --   AST 62*  --   ALT 42  --   ALKPHOS 115  --   BILITOT 0.6  --   GFRNONAA 55* >60  GFRAA >60 >60  ANIONGAP 13 13     Hematology Recent Labs  Lab 04/13/20 0053  WBC 12.0*  RBC 4.61  HGB 13.5  HCT 42.8  MCV 92.8  MCH 29.3  MCHC 31.5  RDW 14.6  PLT 314    BNP Recent Labs  Lab 04/13/20 0053  BNP 364.0*     DDimer No results for input(s): DDIMER in the last 168 hours.   Radiology    CARDIAC CATHETERIZATION  Result Date: 0000000  LV end diastolic pressure is normal.  1. Normal coronary anatomy. 2. Normal LVEDP 10 mm Hg Plan: medical management with BP control. Anticipate DC today.   DG Chest Port 1 View  Result Date: 04/13/2020 CLINICAL DATA:  Hypoxia. EXAM: PORTABLE CHEST 1 VIEW COMPARISON:  01/14/2006 FINDINGS: There are prominent interstitial lung markings bilaterally with hazy airspace opacities lung bases bilaterally as well as in the right upper lung zone. The heart size is enlarged. There are probable small bilateral pleural effusions without evidence for pneumothorax. Aortic calcifications are noted. There is no acute osseous  abnormality. IMPRESSION: Constellation of findings which can be seen in patients with pulmonary edema or an atypical infectious process. Electronically Signed   By: Constance Holster M.D.   On: 04/13/2020 01:36   ECHOCARDIOGRAM COMPLETE  Result Date: 04/13/2020    ECHOCARDIOGRAM REPORT   [name] Date of Exam: 04/13/2020 Medical Rec #:  [id number]      Height:       64.0 in Accession #:    [id number]     Weight:       174.4 lb Date of Birth:  1961-11-24      BSA:          1.846 m Patient Age:    8 years       BP:           141/73 mmHg Patient Gender: F              HR:           86 bpm. Exam Location:  [facility] Procedure: 2D Echo, Cardiac Doppler and Color Doppler Indications:    R06.02 SOB  History:        Patient has no prior history of Echocardiogram examinations.                 CHF, Abnormal ECG, Arrythmias:LBBB, Signs/Symptoms:Dyspnea; Risk                 Factors:Hypertension, Diabetes and Former Smoker. Cyanosis.  Sonographer:    Roseanna Rainbow RDCS (AE) Referring Phys: [id number] [facility]  Sonographer Comments: Patient is morbidly obese. Image acquisition challenging due to patient body habitus. IMPRESSIONS  1. Right ventricular systolic function is normal. The right ventricular size is normal.  2. The anteroseptal wall is severely hypokinetic to akinetic.More pronounced finding that would be seen with her LBBB alone. Left ventricular ejection fraction, by estimation, is 45%. The left ventricle has mildly decreased function. The left ventricle demonstrates regional wall motion abnormalities (see scoring diagram/findings for description). There is moderate left ventricular hypertrophy. Left ventricular diastolic parameters are consistent with Grade II diastolic dysfunction (pseudonormalization).  3. Left atrial size was mildly dilated.  4. The mitral valve is normal in structure. No evidence of mitral valve regurgitation. No evidence of mitral stenosis.  5. The aortic valve is  tricuspid. Aortic valve regurgitation is not visualized. No aortic stenosis is present.  6. The  inferior vena cava is normal in size with greater than 50% respiratory variability, suggesting right atrial pressure of 3 mmHg. FINDINGS  Left Ventricle: The anteroseptal wall is severely hypokinetic to akinetic. Left ventricular ejection fraction, by estimation, is 45%. The left ventricle has mildly decreased function. The left ventricle demonstrates regional wall motion abnormalities. The left ventricular internal cavity size was normal in size. There is moderate left ventricular hypertrophy. Left ventricular diastolic parameters are consistent with Grade II diastolic dysfunction (pseudonormalization). Right Ventricle: The right ventricular size is normal. No increase in right ventricular wall thickness. Right ventricular systolic function is normal. Left Atrium: Left atrial size was mildly dilated. Right Atrium: Right atrial size was normal in size. Pericardium: There is no evidence of pericardial effusion. Mitral Valve: The mitral valve is normal in structure. No evidence of mitral valve regurgitation. No evidence of mitral valve stenosis. Tricuspid Valve: The tricuspid valve is normal in structure. Tricuspid valve regurgitation is trivial. No evidence of tricuspid stenosis. Aortic Valve: The aortic valve is tricuspid. Aortic valve regurgitation is not visualized. No aortic stenosis is present. Aortic valve mean gradient measures 4.0 mmHg. Aortic valve peak gradient measures 7.6 mmHg. Aortic valve area, by VTI measures 1.87 cm. Pulmonic Valve: The pulmonic valve was not well visualized. Pulmonic valve regurgitation is not visualized. No evidence of pulmonic stenosis. Aorta: The aortic root is normal in size and structure. Pulmonary Artery: Indeterminant PASP, inadequate TR jet. Venous: The inferior vena cava is normal in size with greater than 50% respiratory variability, suggesting right atrial pressure of 3 mmHg.  IAS/Shunts: No atrial level shunt detected by color flow Doppler.  LEFT VENTRICLE PLAX 2D LVIDd:         3.86 cm     Diastology LVIDs:         2.92 cm     LV e' lateral:   5.22 cm/s LV PW:         1.30 cm     LV E/e' lateral: 17.4 LV IVS:        1.30 cm     LV e' medial:    4.05 cm/s LVOT diam:     1.90 cm     LV E/e' medial:  22.4 LV SV:         56 LV SV Index:   30 LVOT Area:     2.84 cm  LV Volumes (MOD) LV vol d, MOD A2C: 61.5 ml LV vol d, MOD A4C: 69.2 ml LV vol s, MOD A2C: 44.0 ml LV vol s, MOD A4C: 34.9 ml LV SV MOD A2C:     17.5 ml LV SV MOD A4C:     69.2 ml LV SV MOD BP:      28.5 ml RIGHT VENTRICLE            IVC RV S prime:     8.59 cm/s  IVC diam: 1.28 cm TAPSE (M-mode): 1.8 cm LEFT ATRIUM             Index       RIGHT ATRIUM           Index LA diam:        4.50 cm 2.44 cm/m  RA Area:     11.20 cm LA Vol (A2C):   46.6 ml 25.25 ml/m RA Volume:   19.50 ml  10.57 ml/m LA Vol (A4C):   62.7 ml 33.97 ml/m LA Biplane Vol: 53.8 ml 29.15 ml/m  AORTIC VALVE AV Area (Vmax):  2.04 cm AV Area (Vmean):   1.98 cm AV Area (VTI):     1.87 cm AV Vmax:           138.16 cm/s AV Vmean:          94.858 cm/s AV VTI:            0.297 m AV Peak Grad:      7.6 mmHg AV Mean Grad:      4.0 mmHg LVOT Vmax:         99.20 cm/s LVOT Vmean:        66.300 cm/s LVOT VTI:          0.196 m LVOT/AV VTI ratio: 0.66  AORTA Ao Root diam: 2.70 cm Ao Asc diam:  2.70 cm MITRAL VALVE MV Area (PHT): 4.89 cm    SHUNTS MV Decel Time: 155 msec    Systemic VTI:  0.20 m MV E velocity: 90.80 cm/s  Systemic Diam: 1.90 cm MV A velocity: 68.90 cm/s MV E/A ratio:  1.32 Carlyle Dolly MD Electronically signed by Carlyle Dolly MD Signature Date/Time: 04/13/2020/11:50:58 AM    Final     Cardiac Studies   Echo 04/13/20 FINDINGS  Left Ventricle: The anteroseptal wall is severely hypokinetic to  akinetic. Left ventricular ejection fraction, by estimation, is 45%. The  left ventricle has mildly decreased function. The left ventricle    demonstrates regional wall motion abnormalities.  The left ventricular internal cavity size was normal in size. There is  moderate left ventricular hypertrophy. Left ventricular diastolic  parameters are consistent with Grade II diastolic dysfunction  (pseudonormalization).   Right Ventricle: The right ventricular size is normal. No increase in  right ventricular wall thickness. Right ventricular systolic function is  normal.   Left Atrium: Left atrial size was mildly dilated.   Right Atrium: Right atrial size was normal in size.   Pericardium: There is no evidence of pericardial effusion.   Mitral Valve: The mitral valve is normal in structure. No evidence of  mitral valve regurgitation. No evidence of mitral valve stenosis.   Tricuspid Valve: The tricuspid valve is normal in structure. Tricuspid  valve regurgitation is trivial. No evidence of tricuspid stenosis.   Aortic Valve: The aortic valve is tricuspid. Aortic valve regurgitation is  not visualized. No aortic stenosis is present. Aortic valve mean gradient  measures 4.0 mmHg. Aortic valve peak gradient measures 7.6 mmHg. Aortic  valve area, by VTI measures 1.87  cm.   Pulmonic Valve: The pulmonic valve was not well visualized. Pulmonic valve  regurgitation is not visualized. No evidence of pulmonic stenosis.   Aorta: The aortic root is normal in size and structure.   Pulmonary Artery: Indeterminant PASP, inadequate TR jet.   Venous: The inferior vena cava is normal in size with greater than 50%  respiratory variability, suggesting right atrial pressure of 3 mmHg.   IAS/Shunts: No atrial level shunt detected by color flow Doppler.   Cardiac cath [id number]  LV end diastolic pressure is normal.    Most Recent Value  AO Systolic Pressure [id number] mmHg  AO Diastolic Pressure 64 mmHg  AO Mean 89 mmHg  LV Systolic Pressure [id number] mmHg  LV Diastolic Pressure 6 mmHg  LV EDP 10 mmHg  AOp Systolic Pressure [id number] mmHg  AOp  Diastolic Pressure 65 mmHg  AOp Mean Pressure 95 mmHg  LVp Systolic Pressure [id number] mmHg  LVp Diastolic Pressure 5 mmHg  LVp EDP Pressure 10 mmHg     1. Normal  coronary anatomy.  2. Normal LVEDP 10 mm Hg  Plan: medical management with BP control. Anticipate DC today. Patient Profile     59 y.o. 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  She is neg 1787 wt is inaccurate.  Now off lasix and LVEDP is normal.     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    Possible discharge today but may need medication adjustment and d/c tomorrow  Accelerated HTN now improved but varies from 142/77 to 101/44   LBBB new this admit   HLD on lipitor 80 with normal cors would decrease though waiting on lipid panel.    Prolonged Qtc.  Waiting on BMP and has been long this admit  DM-2 per IM   SVT in [facility]   AKI with prior Cr 0.70  BMP pending       For questions or updates, please contact [place] Please consult [email or web address] for contact info under        Signed, Cecilie Kicks, NP  04/14/2020, 9:00 AM     Attending Note:   The patient was seen and examined.  Agree with assessment and plan as noted above.  Changes made to the above note as needed.  Patient seen and independently examined with Cecilie Kicks, NP .   We discussed all aspects of the encounter. I agree with the assessment and plan as stated above.  1.   Mild systolic CHF:   Has normal coronaries.   Her mild troponin elevation was due to CHF and not ACS. Agree with transitioning form amlodipine to ARB.   BP is low today,  rec holding BP meds for this  am. We can give later today if needed.   She is stable from a cardiac standpoint and can go home. Will DC amlodipine Start losartan 50 mg a day starting tomorrow .   2.  HTN:   See above.  DC amlodipine due to chronic systolic CHF.   Start Losartan 50  Mg a day   3.   SVT:  Cont coreg  4.  LBBB:  Stable    I have spent a total of 40 minutes with patient reviewing hospital  notes , telemetry, EKGs, labs and examining patient as well as establishing an assessment and plan that was discussed with the patient. > 50% of time was spent in direct patient care.   Thayer Headings, Brooke Bonito., MD, [facility] 04/14/2020, 10:41 AM [address]  [place] Pager [phone]

## 2020-04-15 ENCOUNTER — Other Ambulatory Visit: Payer: Self-pay | Admitting: Cardiology

## 2020-04-15 DIAGNOSIS — I471 Supraventricular tachycardia: Secondary | ICD-10-CM

## 2020-04-15 LAB — GLUCOSE, CAPILLARY
Glucose-Capillary: 125 mg/dL — ABNORMAL HIGH (ref 70–99)
Glucose-Capillary: 115 mg/dL — ABNORMAL HIGH (ref 70–99)

## 2020-04-15 LAB — CBC
HCT: 40.6 % (ref 36.0–46.0)
Hemoglobin: 13.3 g/dL (ref 12.0–15.0)
MCH: 29.4 pg (ref 26.0–34.0)
MCHC: 32.8 g/dL (ref 30.0–36.0)
MCV: 89.6 fL (ref 80.0–100.0)
Platelets: 298 10*3/uL (ref 150–400)
RBC: 4.53 MIL/uL (ref 3.87–5.11)
RDW: 15 % (ref 11.5–15.5)
WBC: 8.6 10*3/uL (ref 4.0–10.5)
nRBC: 0 % (ref 0.0–0.2)

## 2020-04-15 LAB — BASIC METABOLIC PANEL
Anion gap: 8 (ref 5–15)
BUN: 19 mg/dL (ref 6–20)
CO2: 28 mmol/L (ref 22–32)
Calcium: 9.2 mg/dL (ref 8.9–10.3)
Chloride: 101 mmol/L (ref 98–111)
Creatinine, Ser: 1.12 mg/dL — ABNORMAL HIGH (ref 0.44–1.00)
GFR calc Af Amer: >60 mL/min (ref >60)
GFR calc non Af Amer: 54 mL/min — ABNORMAL LOW (ref >60)
Glucose, Bld: 127 mg/dL — ABNORMAL HIGH (ref 70–99)
Potassium: 4.3 mmol/L (ref 3.5–5.1)
Sodium: 137 mmol/L (ref 135–145)

## 2020-04-15 MED ORDER — LOSARTAN POTASSIUM 50 MG PO TABS: 50.0000 mg | ORAL_TABLET | Freq: Every day | ORAL | 0 refills | Status: DC

## 2020-04-15 MED ORDER — CARVEDILOL 3.125 MG PO TABS: 3.1250 mg | ORAL_TABLET | Freq: Two times a day (BID) | ORAL | 1 refills | Status: DC

## 2020-04-15 MED ORDER — ISOSORBIDE MONONITRATE ER 30 MG PO TB24: 30.0000 mg | ORAL_TABLET | Freq: Every day | ORAL | 1 refills | Status: DC

## 2020-04-15 MED ORDER — ATORVASTATIN CALCIUM 80 MG PO TABS: 80.0000 mg | ORAL_TABLET | Freq: Every day | ORAL | 1 refills | Status: DC

## 2020-04-15 MED ORDER — ASPIRIN 81 MG PO CHEW: 81.0000 mg | CHEWABLE_TABLET | Freq: Every day | ORAL | 1 refills | Status: DC

## 2020-04-15 NOTE — Discharge Summary (Signed)
Physician Discharge Summary  Michelle Morrison [id number] DOB: 25-Jan-1961 DOA: 04/13/2020  PCP: Rosita Fire, MD  Admit date: 04/13/2020 Discharge date: 04/15/2020  Admitted From: Home. Disposition:  Home.   Recommendations for Outpatient Follow-up:  1. Follow up with PCP in 1-2 weeks 2. Please obtain BMP/CBC in one week Please follow up with cardiology at [facility] cardiology when scheduled.   Discharge Condition:stable.  CODE STATUS:full code.  Diet recommendation: Heart Healthy Brief/Interim Summary: 59 year old lady with prior history of hypertension, hyperlipidemia, former tobacco abuse, diabetes mellitus she was initially admitted to [facility] for shortness of breath was found to have acute respiratory failure with hypoxia secondary to acute heart failure in addition to accelerated hypertension.  Further work-up showed elevated troponin.  Left bundle branch block EKG.  Echocardiogram was done and showed left ventricular ejection fraction of 45% with moderate LVH and grade 2 diastolic dysfunction.  Discharge Diagnoses:  Principal Problem:   Dyspnea due to congestive heart failure (HCC) Active Problems:   Accelerated hypertension   Hyperglycemia due to diabetes mellitus (HCC)   Elevated troponin   Acute on chronic diastolic heart failure Much improved.    Patient underwent cardiac catheterization on 04/14/2020 reveals LV end diastolic pressure is normal.  Normal coronary anatomy. Normal LVEDP 10 mm Hg Cardiology recommends medical management with BP control.  Resume aspirin 81 mg, Coreg 3.125 mg twice daily and cozaar 50 mg daily.   Accelerated hypertension Blood pressure parameters improved. Resume Coreg and Cozaar 50 mg daily.  DC amlodipine.     Diabetes mellitus with hyperglycemia Continue with sliding scale insulin. Hemoglobin A1c at 7.3. Continue with home meds on discharge.   AKI Improved. Recheck BMP in one week.    Prolonged QTC Keep potassium  greater than 4 and magnesium greater than 2.   Hyperlipidemia:  Cardiology recommends stopping the statin.  LDL is 100.    Discharge Instructions  Discharge Instructions    Diet - low sodium heart healthy   Complete by: As directed    Discharge instructions   Complete by: As directed    Please follow up with PCP in one week.     Allergies as of 04/15/2020   No Known Allergies     Medication List    STOP taking these medications   amLODipine 10 MG tablet Commonly known as: NORVASC   dicyclomine 10 MG capsule Commonly known as: BENTYL   hydrochlorothiazide 12.5 MG capsule Commonly known as: MICROZIDE   lisinopril 40 MG tablet Commonly known as: ZESTRIL   simvastatin 40 MG tablet Commonly known as: ZOCOR     TAKE these medications   aspirin 81 MG chewable tablet Chew 1 tablet (81 mg total) by mouth daily. Start taking on: Apr 16, 2020   carvedilol 3.125 MG tablet Commonly known as: COREG Take 1 tablet (3.125 mg total) by mouth 2 (two) times daily with a meal.   isosorbide mononitrate 30 MG 24 hr tablet Commonly known as: IMDUR Take 1 tablet (30 mg total) by mouth daily.   losartan 50 MG tablet Commonly known as: COZAAR Take 1 tablet (50 mg total) by mouth daily. Start taking on: Apr 16, 2020   Tradjenta 5 MG Tabs tablet Generic drug: linagliptin Take 5 mg by mouth daily.      Follow-up Information    Rosita Fire, MD. Schedule an appointment as soon as possible for a visit in 1 week(s).   Specialty: Internal Medicine Contact information: [address] [place] [address]  [id number]        Arnoldo Lenis, MD .   Specialty: Cardiology Contact information: [address] [place] [address] [phone]          No Known Allergies  Consultations:  Cardiology.    Procedures/Studies: CARDIAC CATHETERIZATION  Result Date: 0000000  LV end diastolic pressure is normal.  1. Normal coronary anatomy. 2.  Normal LVEDP 10 mm Hg Plan: medical management with BP control. Anticipate DC today.   DG Chest Port 1 View  Result Date: 04/13/2020 CLINICAL DATA:  Hypoxia. EXAM: PORTABLE CHEST 1 VIEW COMPARISON:  01/14/2006 FINDINGS: There are prominent interstitial lung markings bilaterally with hazy airspace opacities lung bases bilaterally as well as in the right upper lung zone. The heart size is enlarged. There are probable small bilateral pleural effusions without evidence for pneumothorax. Aortic calcifications are noted. There is no acute osseous abnormality. IMPRESSION: Constellation of findings which can be seen in patients with pulmonary edema or an atypical infectious process. Electronically Signed   By: Constance Holster M.D.   On: 04/13/2020 01:36   ECHOCARDIOGRAM COMPLETE  Result Date: 04/13/2020    ECHOCARDIOGRAM REPORT   [name] Date of Exam: 04/13/2020 Medical Rec #:  [id number]      Height:       64.0 in Accession #:    [id number]     Weight:       174.4 lb Date of Birth:  01-23-1961      BSA:          1.846 m Patient Age:    30 years       BP:           141/73 mmHg Patient Gender: F              HR:           86 bpm. Exam Location:  [facility] Procedure: 2D Echo, Cardiac Doppler and Color Doppler Indications:    R06.02 SOB  History:        Patient has no prior history of Echocardiogram examinations.                 CHF, Abnormal ECG, Arrythmias:LBBB, Signs/Symptoms:Dyspnea; Risk                 Factors:Hypertension, Diabetes and Former Smoker. Cyanosis.  Sonographer:    Roseanna Rainbow RDCS (AE) Referring Phys: [id number] [facility]  Sonographer Comments: Patient is morbidly obese. Image acquisition challenging due to patient body habitus. IMPRESSIONS  1. Right ventricular systolic function is normal. The right ventricular size is normal.  2. The anteroseptal wall is severely hypokinetic to akinetic.More pronounced finding that would be seen with her LBBB alone. Left ventricular  ejection fraction, by estimation, is 45%. The left ventricle has mildly decreased function. The left ventricle demonstrates regional wall motion abnormalities (see scoring diagram/findings for description). There is moderate left ventricular hypertrophy. Left ventricular diastolic parameters are consistent with Grade II diastolic dysfunction (pseudonormalization).  3. Left atrial size was mildly dilated.  4. The mitral valve is normal in structure. No evidence of mitral valve regurgitation. No evidence of mitral stenosis.  5. The aortic valve is tricuspid. Aortic valve regurgitation is not visualized. No aortic stenosis is present.  6. The inferior vena cava is normal in size with greater than 50% respiratory variability, suggesting right atrial pressure of 3 mmHg. FINDINGS  Left Ventricle: The anteroseptal wall is severely hypokinetic to akinetic.  Left ventricular ejection fraction, by estimation, is 45%. The left ventricle has mildly decreased function. The left ventricle demonstrates regional wall motion abnormalities. The left ventricular internal cavity size was normal in size. There is moderate left ventricular hypertrophy. Left ventricular diastolic parameters are consistent with Grade II diastolic dysfunction (pseudonormalization). Right Ventricle: The right ventricular size is normal. No increase in right ventricular wall thickness. Right ventricular systolic function is normal. Left Atrium: Left atrial size was mildly dilated. Right Atrium: Right atrial size was normal in size. Pericardium: There is no evidence of pericardial effusion. Mitral Valve: The mitral valve is normal in structure. No evidence of mitral valve regurgitation. No evidence of mitral valve stenosis. Tricuspid Valve: The tricuspid valve is normal in structure. Tricuspid valve regurgitation is trivial. No evidence of tricuspid stenosis. Aortic Valve: The aortic valve is tricuspid. Aortic valve regurgitation is not visualized. No aortic  stenosis is present. Aortic valve mean gradient measures 4.0 mmHg. Aortic valve peak gradient measures 7.6 mmHg. Aortic valve area, by VTI measures 1.87 cm. Pulmonic Valve: The pulmonic valve was not well visualized. Pulmonic valve regurgitation is not visualized. No evidence of pulmonic stenosis. Aorta: The aortic root is normal in size and structure. Pulmonary Artery: Indeterminant PASP, inadequate TR jet. Venous: The inferior vena cava is normal in size with greater than 50% respiratory variability, suggesting right atrial pressure of 3 mmHg. IAS/Shunts: No atrial level shunt detected by color flow Doppler.  LEFT VENTRICLE PLAX 2D LVIDd:         3.86 cm     Diastology LVIDs:         2.92 cm     LV e' lateral:   5.22 cm/s LV PW:         1.30 cm     LV E/e' lateral: 17.4 LV IVS:        1.30 cm     LV e' medial:    4.05 cm/s LVOT diam:     1.90 cm     LV E/e' medial:  22.4 LV SV:         56 LV SV Index:   30 LVOT Area:     2.84 cm  LV Volumes (MOD) LV vol d, MOD A2C: 61.5 ml LV vol d, MOD A4C: 69.2 ml LV vol s, MOD A2C: 44.0 ml LV vol s, MOD A4C: 34.9 ml LV SV MOD A2C:     17.5 ml LV SV MOD A4C:     69.2 ml LV SV MOD BP:      28.5 ml RIGHT VENTRICLE            IVC RV S prime:     8.59 cm/s  IVC diam: 1.28 cm TAPSE (M-mode): 1.8 cm LEFT ATRIUM             Index       RIGHT ATRIUM           Index LA diam:        4.50 cm 2.44 cm/m  RA Area:     11.20 cm LA Vol (A2C):   46.6 ml 25.25 ml/m RA Volume:   19.50 ml  10.57 ml/m LA Vol (A4C):   62.7 ml 33.97 ml/m LA Biplane Vol: 53.8 ml 29.15 ml/m  AORTIC VALVE AV Area (Vmax):    2.04 cm AV Area (Vmean):   1.98 cm AV Area (VTI):     1.87 cm AV Vmax:           138.16  cm/s AV Vmean:          94.858 cm/s AV VTI:            0.297 m AV Peak Grad:      7.6 mmHg AV Mean Grad:      4.0 mmHg LVOT Vmax:         99.20 cm/s LVOT Vmean:        66.300 cm/s LVOT VTI:          0.196 m LVOT/AV VTI ratio: 0.66  AORTA Ao Root diam: 2.70 cm Ao Asc diam:  2.70 cm MITRAL VALVE MV Area  (PHT): 4.89 cm    SHUNTS MV Decel Time: 155 msec    Systemic VTI:  0.20 m MV E velocity: 90.80 cm/s  Systemic Diam: 1.90 cm MV A velocity: 68.90 cm/s MV E/A ratio:  1.32 Carlyle Dolly MD Electronically signed by Carlyle Dolly MD Signature Date/Time: 04/13/2020/11:50:58 AM    Final        Subjective: No chest pain or sob.   Discharge Exam: Vitals:   04/14/20 1922 04/15/20 0430  BP: (!) 117/59 127/73  Pulse: (!) 57 71  Resp: 19 19  Temp: 97.8 F (36.6 C) (!) 97.4 F (36.3 C)  SpO2: 96% 96%   Vitals:   04/14/20 1256 04/14/20 1720 04/14/20 1922 04/15/20 0430  BP: 118/63 135/61 (!) 117/59 127/73  Pulse: 63  (!) 57 71  Resp:   19 19  Temp:   97.8 F (36.6 C) (!) 97.4 F (36.3 C)  TempSrc:   Oral Oral  SpO2: 96%  96% 96%  Weight:    78.1 kg  Height:        General: Pt is alert, awake, not in acute distress Cardiovascular: RRR, S1/S2 +, no rubs, no gallops Respiratory: CTA bilaterally, no wheezing, no rhonchi Abdominal: Soft, NT, ND, bowel sounds + Extremities: no edema, no cyanosis    The results of significant diagnostics from this hospitalization (including imaging, microbiology, ancillary and laboratory) are listed below for reference.     Microbiology: Recent Results (from the past 240 hour(s))  SARS Coronavirus 2 by RT PCR (hospital order, performed in [facility] hospital lab) Nasopharyngeal Nasopharyngeal Swab     Status: None   Collection Time: 04/13/20 12:54 AM   Specimen: Nasopharyngeal Swab  Result Value Ref Range Status   SARS Coronavirus 2 NEGATIVE NEGATIVE Final    Comment: (NOTE) SARS-CoV-2 target nucleic acids are NOT DETECTED. The SARS-CoV-2 RNA is generally detectable in upper and lower respiratory specimens during the acute phase of infection. The lowest concentration of SARS-CoV-2 viral copies this assay can detect is 250 copies / mL. A negative result does not preclude SARS-CoV-2 infection and should not be used as the sole basis for  treatment or other patient management decisions.  A negative result may occur with improper specimen collection / handling, submission of specimen other than nasopharyngeal swab, presence of viral mutation(s) within the areas targeted by this assay, and inadequate number of viral copies (<250 copies / mL). A negative result must be combined with clinical observations, patient history, and epidemiological information. Fact Sheet for Patients:   StrictlyIdeas.no Fact Sheet for Healthcare Providers: BankingDealers.co.za This test is not yet approved or cleared  by the [place] FDA and has been authorized for detection and/or diagnosis of SARS-CoV-2 by FDA under an Emergency Use Authorization (EUA).  This EUA will remain in effect (meaning this test can be used) for the duration of the COVID-19  declaration under Section 564(b)(1) of the Act, 21 U.S.C. section 360bbb-3(b)(1), unless the authorization is terminated or revoked sooner. Performed at [facility], [address], [place], [place] [address]   MRSA PCR Screening     Status: None   Collection Time: 04/13/20  3:33 AM   Specimen: Nasal Mucosa; Nasopharyngeal  Result Value Ref Range Status   MRSA by PCR NEGATIVE NEGATIVE Final    Comment:        The GeneXpert MRSA Assay (FDA approved for NASAL specimens only), is one component of a comprehensive MRSA colonization surveillance program. It is not intended to diagnose MRSA infection nor to guide or monitor treatment for MRSA infections. Performed at [facility], [address] [place], [place] [address]      Labs: BNP (last 3 results) Recent Labs    04/13/20 0053  BNP 123XX123*   Basic Metabolic Panel: Recent Labs  Lab 04/13/20 0053 04/13/20 0805 04/14/20 0903 04/15/20 0546  NA 134* 138 130* 137  K 3.7 4.5 3.6 4.3  CL 100 98 95* 101  CO2 21* 27 25 28   GLUCOSE 382* 157* 149* 127*  BUN 15 19 17 19   CREATININE 1.11*  0.97 1.03* 1.12*  CALCIUM 8.3* 8.9 8.3* 9.2  MG  --  2.0  --   --    Liver Function Tests: Recent Labs  Lab 04/13/20 0053  AST 62*  ALT 42  ALKPHOS 115  BILITOT 0.6  PROT 7.9  ALBUMIN 3.7   No results for input(s): LIPASE, AMYLASE in the last 168 hours. No results for input(s): AMMONIA in the last 168 hours. CBC: Recent Labs  Lab 04/13/20 0053 04/14/20 0903 04/15/20 0546  WBC 12.0* 10.1 8.6  NEUTROABS 6.4  --   --   HGB 13.5 12.6 13.3  HCT 42.8 37.9 40.6  MCV 92.8 90.0 89.6  PLT 314 281 298   Cardiac Enzymes: No results for input(s): CKTOTAL, CKMB, CKMBINDEX, TROPONINI in the last 168 hours. BNP: Invalid input(s): POCBNP CBG: Recent Labs  Lab 04/14/20 1236 04/14/20 1637 04/14/20 2114 04/15/20 0629 04/15/20 1058  GLUCAP 98 109* 161* 125* 115*   D-Dimer No results for input(s): DDIMER in the last 72 hours. Hgb A1c Recent Labs    04/13/20 0053  HGBA1C 7.3*   Lipid Profile Recent Labs    04/14/20 0903  CHOL 172  HDL 62  LDLCALC 100*  TRIG 52  CHOLHDL 2.8   Thyroid function studies Recent Labs    04/13/20 0805  TSH 1.277   Anemia work up No results for input(s): VITAMINB12, FOLATE, FERRITIN, TIBC, IRON, RETICCTPCT in the last 72 hours. Urinalysis    Component Value Date/Time   COLORURINE STRAW (A) 04/13/2020 1113   APPEARANCEUR CLEAR 04/13/2020 1113   APPEARANCEUR Cloudy (A) 09/25/2015 1230   LABSPEC 1.012 04/13/2020 1113   PHURINE 7.0 04/13/2020 1113   GLUCOSEU NEGATIVE 04/13/2020 1113   HGBUR MODERATE (A) 04/13/2020 [place] 04/13/2020 1113   BILIRUBINUR Negative 09/25/2015 1230   KETONESUR NEGATIVE 04/13/2020 1113   PROTEINUR 30 (A) 04/13/2020 1113   NITRITE NEGATIVE 04/13/2020 1113   LEUKOCYTESUR NEGATIVE 04/13/2020 1113   Sepsis Labs Invalid input(s): PROCALCITONIN,  WBC,  LACTICIDVEN Microbiology Recent Results (from the past 240 hour(s))  SARS Coronavirus 2 by RT PCR (hospital order, performed in [place] hospital lab) Nasopharyngeal Nasopharyngeal Swab     Status: None   Collection Time: 04/13/20 12:54 AM   Specimen: Nasopharyngeal Swab  Result Value  Ref Range Status   SARS Coronavirus 2 NEGATIVE NEGATIVE Final    Comment: (NOTE) SARS-CoV-2 target nucleic acids are NOT DETECTED. The SARS-CoV-2 RNA is generally detectable in upper and lower respiratory specimens during the acute phase of infection. The lowest concentration of SARS-CoV-2 viral copies this assay can detect is 250 copies / mL. A negative result does not preclude SARS-CoV-2 infection and should not be used as the sole basis for treatment or other patient management decisions.  A negative result may occur with improper specimen collection / handling, submission of specimen other than nasopharyngeal swab, presence of viral mutation(s) within the areas targeted by this assay, and inadequate number of viral copies (<250 copies / mL). A negative result must be combined with clinical observations, patient history, and epidemiological information. Fact Sheet for Patients:   StrictlyIdeas.no Fact Sheet for Healthcare Providers: BankingDealers.co.za This test is not yet approved or cleared  by the [place] FDA and has been authorized for detection and/or diagnosis of SARS-CoV-2 by FDA under an Emergency Use Authorization (EUA).  This EUA will remain in effect (meaning this test can be used) for the duration of the COVID-19 declaration under Section 564(b)(1) of the Act, 21 U.S.C. section 360bbb-3(b)(1), unless the authorization is terminated or revoked sooner. Performed at [facility], [address], [place], [place] [address]   MRSA PCR Screening     Status: None   Collection Time: 04/13/20  3:33 AM   Specimen: Nasal Mucosa; Nasopharyngeal  Result Value Ref Range Status   MRSA by PCR NEGATIVE NEGATIVE Final    Comment:        The GeneXpert MRSA Assay (FDA approved  for NASAL specimens only), is one component of a comprehensive MRSA colonization surveillance program. It is not intended to diagnose MRSA infection nor to guide or monitor treatment for MRSA infections. Performed at [facility], [address] [place], [place] [address]      Time coordinating discharge: 32 minutes  SIGNED:   Hosie Poisson, MD  [facility] 04/15/2020, 11:46 AM

## 2020-04-15 NOTE — Progress Notes (Signed)
Progress Note  Patient Name: Michelle Morrison Date of Encounter: 04/15/2020  Primary Cardiologist:   Carlyle Dolly, MD   Subjective   Feels good.  No pain.  No SOB.   Inpatient Medications    Scheduled Meds: . aspirin  81 mg Oral Daily  . atorvastatin  80 mg Oral Daily  . carvedilol  3.125 mg Oral BID WC  . Chlorhexidine Gluconate Cloth  6 each Topical Daily  . insulin aspart  0-15 Units Subcutaneous TID WC  . insulin aspart  0-5 Units Subcutaneous QHS  . isosorbide mononitrate  30 mg Oral Daily  . losartan  50 mg Oral Daily  . mouth rinse  15 mL Mouth Rinse BID  . sodium chloride flush  3 mL Intravenous Q12H  . sodium chloride flush  3 mL Intravenous Q12H  . sodium chloride flush  3 mL Intravenous Q12H   Continuous Infusions: . sodium chloride    . sodium chloride     PRN Meds: sodium chloride, sodium chloride, acetaminophen, labetalol, ondansetron (ZOFRAN) IV, sodium chloride flush, sodium chloride flush   Vital Signs    Vitals:   04/14/20 1256 04/14/20 1720 04/14/20 1922 04/15/20 0430  BP: 118/63 135/61 (!) 117/59 127/73  Pulse: 63  (!) 57 71  Resp:   19 19  Temp:   97.8 F (36.6 C) (!) 97.4 F (36.3 C)  TempSrc:   Oral Oral  SpO2: 96%  96% 96%  Weight:    78.1 kg  Height:        Intake/Output Summary (Last 24 hours) at 04/15/2020 0938 Last data filed at 04/15/2020 0835 Gross per 24 hour  Intake 1409.75 ml  Output 1325 ml  Net 84.75 ml   Filed Weights   04/13/20 0342 04/14/20 0436 04/15/20 0430  Weight: 79.1 kg 77.6 kg 78.1 kg    Telemetry    NSR, SVT, runs of junctional bradycardia - Personally Reviewed  ECG    NA - Personally Reviewed  Physical Exam   GEN: No acute distress.   Neck: No  JVD Cardiac: RRR, no murmurs, rubs, or gallops.  Respiratory: Clear  to auscultation bilaterally. GI: Soft, nontender, non-distended  MS: No  edema; No deformity. Neuro:  Nonfocal  Psych: Normal affect   Labs    Chemistry Recent Labs  Lab  04/13/20 0053 04/13/20 0053 04/13/20 0805 04/14/20 0903 04/15/20 0546  NA 134*   < > 138 130* 137  K 3.7   < > 4.5 3.6 4.3  CL 100   < > 98 95* 101  CO2 21*   < > 27 25 28   GLUCOSE 382*   < > 157* 149* 127*  BUN 15   < > 19 17 19   CREATININE 1.11*   < > 0.97 1.03* 1.12*  CALCIUM 8.3*   < > 8.9 8.3* 9.2  PROT 7.9  --   --   --   --   ALBUMIN 3.7  --   --   --   --   AST 62*  --   --   --   --   ALT 42  --   --   --   --   ALKPHOS 115  --   --   --   --   BILITOT 0.6  --   --   --   --   GFRNONAA 55*   < > >60 60* 54*  GFRAA >60   < > >60 >60 >60  ANIONGAP 13   < > 13 10 8    < > = values in this interval not displayed.     Hematology Recent Labs  Lab 04/13/20 0053 04/14/20 0903 04/15/20 0546  WBC 12.0* 10.1 8.6  RBC 4.61 4.21 4.53  HGB 13.5 12.6 13.3  HCT 42.8 37.9 40.6  MCV 92.8 90.0 89.6  MCH 29.3 29.9 29.4  MCHC 31.5 33.2 32.8  RDW 14.6 14.6 15.0  PLT 314 281 298    Cardiac EnzymesNo results for input(s): TROPONINI in the last 168 hours. No results for input(s): TROPIPOC in the last 168 hours.   BNP Recent Labs  Lab 04/13/20 0053  BNP 364.0*     DDimer No results for input(s): DDIMER in the last 168 hours.   Radiology    CARDIAC CATHETERIZATION  Result Date: 0000000  LV end diastolic pressure is normal.  1. Normal coronary anatomy. 2. Normal LVEDP 10 mm Hg Plan: medical management with BP control. Anticipate DC today.   ECHOCARDIOGRAM COMPLETE  Result Date: 04/13/2020    ECHOCARDIOGRAM REPORT   [name] Date of Exam: 04/13/2020 Medical Rec #:  [id number]      Height:       64.0 in Accession #:    [id number]     Weight:       174.4 lb Date of Birth:  07-04-1961      BSA:          1.846 m Patient Age:    59 years       BP:           141/73 mmHg Patient Gender: F              HR:           86 bpm. Exam Location:  [facility] Procedure: 2D Echo, Cardiac Doppler and Color Doppler Indications:    R06.02 SOB  History:        Patient has  no prior history of Echocardiogram examinations.                 CHF, Abnormal ECG, Arrythmias:LBBB, Signs/Symptoms:Dyspnea; Risk                 Factors:Hypertension, Diabetes and Former Smoker. Cyanosis.  Sonographer:    Roseanna Rainbow RDCS (AE) Referring Phys: [id number] [facility]  Sonographer Comments: Patient is morbidly obese. Image acquisition challenging due to patient body habitus. IMPRESSIONS  1. Right ventricular systolic function is normal. The right ventricular size is normal.  2. The anteroseptal wall is severely hypokinetic to akinetic.More pronounced finding that would be seen with her LBBB alone. Left ventricular ejection fraction, by estimation, is 45%. The left ventricle has mildly decreased function. The left ventricle demonstrates regional wall motion abnormalities (see scoring diagram/findings for description). There is moderate left ventricular hypertrophy. Left ventricular diastolic parameters are consistent with Grade II diastolic dysfunction (pseudonormalization).  3. Left atrial size was mildly dilated.  4. The mitral valve is normal in structure. No evidence of mitral valve regurgitation. No evidence of mitral stenosis.  5. The aortic valve is tricuspid. Aortic valve regurgitation is not visualized. No aortic stenosis is present.  6. The inferior vena cava is normal in size with greater than 50% respiratory variability, suggesting right atrial pressure of 3 mmHg. FINDINGS  Left Ventricle: The anteroseptal wall is severely hypokinetic to akinetic. Left ventricular ejection fraction, by estimation, is 45%. The left ventricle has mildly decreased function. The  left ventricle demonstrates regional wall motion abnormalities. The left ventricular internal cavity size was normal in size. There is moderate left ventricular hypertrophy. Left ventricular diastolic parameters are consistent with Grade II diastolic dysfunction (pseudonormalization). Right Ventricle: The right ventricular size is  normal. No increase in right ventricular wall thickness. Right ventricular systolic function is normal. Left Atrium: Left atrial size was mildly dilated. Right Atrium: Right atrial size was normal in size. Pericardium: There is no evidence of pericardial effusion. Mitral Valve: The mitral valve is normal in structure. No evidence of mitral valve regurgitation. No evidence of mitral valve stenosis. Tricuspid Valve: The tricuspid valve is normal in structure. Tricuspid valve regurgitation is trivial. No evidence of tricuspid stenosis. Aortic Valve: The aortic valve is tricuspid. Aortic valve regurgitation is not visualized. No aortic stenosis is present. Aortic valve mean gradient measures 4.0 mmHg. Aortic valve peak gradient measures 7.6 mmHg. Aortic valve area, by VTI measures 1.87 cm. Pulmonic Valve: The pulmonic valve was not well visualized. Pulmonic valve regurgitation is not visualized. No evidence of pulmonic stenosis. Aorta: The aortic root is normal in size and structure. Pulmonary Artery: Indeterminant PASP, inadequate TR jet. Venous: The inferior vena cava is normal in size with greater than 50% respiratory variability, suggesting right atrial pressure of 3 mmHg. IAS/Shunts: No atrial level shunt detected by color flow Doppler.  LEFT VENTRICLE PLAX 2D LVIDd:         3.86 cm     Diastology LVIDs:         2.92 cm     LV e' lateral:   5.22 cm/s LV PW:         1.30 cm     LV E/e' lateral: 17.4 LV IVS:        1.30 cm     LV e' medial:    4.05 cm/s LVOT diam:     1.90 cm     LV E/e' medial:  22.4 LV SV:         56 LV SV Index:   30 LVOT Area:     2.84 cm  LV Volumes (MOD) LV vol d, MOD A2C: 61.5 ml LV vol d, MOD A4C: 69.2 ml LV vol s, MOD A2C: 44.0 ml LV vol s, MOD A4C: 34.9 ml LV SV MOD A2C:     17.5 ml LV SV MOD A4C:     69.2 ml LV SV MOD BP:      28.5 ml RIGHT VENTRICLE            IVC RV S prime:     8.59 cm/s  IVC diam: 1.28 cm TAPSE (M-mode): 1.8 cm LEFT ATRIUM             Index       RIGHT ATRIUM            Index LA diam:        4.50 cm 2.44 cm/m  RA Area:     11.20 cm LA Vol (A2C):   46.6 ml 25.25 ml/m RA Volume:   19.50 ml  10.57 ml/m LA Vol (A4C):   62.7 ml 33.97 ml/m LA Biplane Vol: 53.8 ml 29.15 ml/m  AORTIC VALVE AV Area (Vmax):    2.04 cm AV Area (Vmean):   1.98 cm AV Area (VTI):     1.87 cm AV Vmax:           138.16 cm/s AV Vmean:          94.858 cm/s AV  VTI:            0.297 m AV Peak Grad:      7.6 mmHg AV Mean Grad:      4.0 mmHg LVOT Vmax:         99.20 cm/s LVOT Vmean:        66.300 cm/s LVOT VTI:          0.196 m LVOT/AV VTI ratio: 0.66  AORTA Ao Root diam: 2.70 cm Ao Asc diam:  2.70 cm MITRAL VALVE MV Area (PHT): 4.89 cm    SHUNTS MV Decel Time: 155 msec    Systemic VTI:  0.20 m MV E velocity: 90.80 cm/s  Systemic Diam: 1.90 cm MV A velocity: 68.90 cm/s MV E/A ratio:  1.32 Carlyle Dolly MD Electronically signed by Carlyle Dolly MD Signature Date/Time: 04/13/2020/11:50:58 AM    Final     Cardiac Studies    Diagnostic Dominance: Right      Most Recent Value  AO Systolic Pressure [id number] mmHg  AO Diastolic Pressure 64 mmHg  AO Mean 89 mmHg  LV Systolic Pressure Q000111Q mmHg  LV Diastolic Pressure 6 mmHg  LV EDP 10 mmHg  AOp Systolic Pressure Q000111Q mmHg  AOp Diastolic Pressure 65 mmHg  AOp Mean Pressure 95 mmHg  LVp Systolic Pressure Q000111Q mmHg  LVp Diastolic Pressure 5 mmHg  LVp EDP Pressure 10 mmHg  E  Patient Profile   59 y.o. female with a hx of DM, HTN, HLD, former tobacco abuse, no prior cardiac hx.  Admitted to [facility], with acute hypoxic respiratory failure due to acute HF in combination with accelerated HTN, improved,  She has LBBB and elevated troponin - echo was done and EF 45%, moderate LVH, G2DD.  transferred [facility] for further eval with cardiac cath.  Normal coronaries.    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  Ok to go home on meds as listed from our standpoint.   No further cardiac work up.     Non ischemic Cardiomyopathy:   Now on ACE and beta blocker.  Continue current therapy.    Accelerated HTN:   BP is much improved.  This is being managed in the context of treating his CHF .  No change in therapy.     LBBB:  New.  No further work up.   HLD   Normal coronaries.  I would not suggest a statin at discharge.    SVT:  Brief runs of this and bradycardia on tele.  No sustained arrhythmias.  I will arrange follow up monitoring as an outpatient and follow up in our clinic in [place].     For questions or updates, please contact [place] Please consult [email or web address] for contact info under Cardiology/STEMI.   Signed, Minus Breeding, MD  04/15/2020, 9:38 AM

## 2020-04-17 ENCOUNTER — Encounter: Payer: Self-pay | Admitting: *Deleted

## 2020-04-17 NOTE — Progress Notes (Signed)
Patient ID: Michelle Morrison, female   DOB: 1960/12/07, 59 y.o.   MRN: [id number] Patient enrolled for [employer] to ship a 30 day cardiac event monitor to her home.  Instructions mailed to patient and will also be included in her monitor kit.

## 2020-04-18 LAB — ALDOSTERONE + RENIN ACTIVITY W/ RATIO
ALDO / PRA Ratio: 5.8 (ref 0.0–30.0)
Aldosterone: 3.8 ng/dL (ref 0.0–30.0)
PRA LC/MS/MS: 0.651 ng/mL/hr (ref 0.167–5.380)

## 2020-04-21 ENCOUNTER — Telehealth: Payer: Self-pay | Admitting: Cardiology

## 2020-04-21 ENCOUNTER — Ambulatory Visit (INDEPENDENT_AMBULATORY_CARE_PROVIDER_SITE_OTHER): Payer: Medicare Other

## 2020-04-21 DIAGNOSIS — I471 Supraventricular tachycardia: Secondary | ICD-10-CM

## 2020-04-21 NOTE — Telephone Encounter (Signed)
Pt walked into office, was assisted and left

## 2020-04-21 NOTE — Telephone Encounter (Signed)
Please give pt a call concerning monitor [phone]

## 2020-04-24 NOTE — Progress Notes (Addendum)
Cardiology Office Note  Date: 04/25/2020   ID: Michelle Morrison, DOB 1961/06/21, MRN [id number]  PCP:  Rosita Fire, MD  Cardiologist:  Carlyle Dolly, MD Electrophysiologist:  None   Chief Complaint: F/U CHF, Dyspnea,  HTN, DM2,   History of Present Illness: Michelle Morrison is a 59 y.o. female with a history of hypertension, hyperlipidemia, former tobacco abuse, diabetes.    Admitted to [facility] 04/13/2020 for shortness of breath and found to have acute respiratory failure with hypoxia secondary to acute heart failure with elevated troponins.  EKG showed left bundle branch block, echocardiogram showed LVEF of 45% with moderate LVH and grade 2 diastolic dysfunction.  She had a cardiac catheterization on 04/14/2020 showing LVEDP was normal.  Normal coronary anatomy LVEDP 10 mmHg. She had brief runs of SVT on telemetry and was placed on 30 day event monitor  LDL was 100 and cardiology recommended stopping the statin.  The following medications were discontinued; simvastatin, lisinopril, HCTZ, dicyclomine, amlodipine.  Aspirin, carvedilol, Imdur, losartan, Tradjenta, were continued.  Patient was placed on a 30-day event monitor for suspicion of SVT.  She continues to wear the event monitor . She denies any recent issues with progressive anginal or exertional symptoms.  She only took her blood pressure medication approximately 30 minutes prior to arrival.  Blood pressure remains elevated.  Advised her she needs to take her blood pressure medication earlier in the day assuming she wakes up.  She recently procured a blood pressure cuff and will start taking her blood pressures every day.  She denies any other stroke or TIA-like symptoms, orthostatic symptoms, palpitations, bleeding issues, PND, orthopnea, claudication, DVT or PE-like symptoms, or lower extremity edema.  She states she has been rushing today doing several things prior to arrival which may have contributed to her elevated blood  pressure.  States she is weighing herself every day and watching her fluid intake and salt intake.  Past Medical History:  Diagnosis Date  . Diabetes mellitus without complication ([place])   . Diarrhea   . Herpes simplex infection   . Hyperlipidemia   . Hypertension     Past Surgical History:  Procedure Laterality Date  . CESAREAN SECTION    . CHOLECYSTECTOMY    . COLONOSCOPY N/A 08/26/2014   Procedure: COLONOSCOPY;  Surgeon: [name], MD;  Location: [facility];  Service: Endoscopy;  Laterality: N/A;  9:30 AM - moved to 8:30 - Ginger to notify pt  . LEFT HEART CATH AND CORONARY ANGIOGRAPHY N/A 04/14/2020   Procedure: LEFT HEART CATH AND CORONARY ANGIOGRAPHY;  Surgeon: [name], [name], MD;  Location: [facility] CV LAB;  Service: Cardiovascular;  Laterality: N/A;    Current Outpatient Medications  Medication Sig Dispense Refill  . aspirin 81 MG chewable tablet Chew 1 tablet (81 mg total) by mouth daily. 30 tablet 1  . atorvastatin (LIPITOR) 80 MG tablet Take 80 mg by mouth daily.    . carvedilol (COREG) 3.125 MG tablet Take 1 tablet (3.125 mg total) by mouth 2 (two) times daily with a meal. 60 tablet 1  . isosorbide mononitrate (IMDUR) 30 MG 24 hr tablet Take 1 tablet (30 mg total) by mouth daily. 30 tablet 1  . losartan (COZAAR) 50 MG tablet Take 1 tablet (50 mg total) by mouth daily. 30 tablet 0  . TRADJENTA 5 MG TABS tablet Take 5 mg by mouth daily.      No current facility-administered medications for this visit.  Allergies:  Patient has no known allergies.   Social History: The patient  reports that she has quit smoking. Her smoking use included cigarettes. She has never used smokeless tobacco. She reports that she does not drink alcohol or use drugs.   Family History: The patient's family history includes Diabetes in her mother; Hypertension in her mother.   ROS:  Please see the history of present illness. Otherwise, complete review of systems is positive for none.   All other systems are reviewed and negative.   Physical Exam: VS:  BP (!) 160/96   Pulse 82   Ht 5\' 5"  (1.651 m)   Wt 174 lb (78.9 kg)   LMP 05/23/2007   SpO2 98%   BMI 28.96 kg/m , BMI Body mass index is 28.96 kg/m.  Wt Readings from Last 3 Encounters:  04/25/20 174 lb (78.9 kg)  04/15/20 172 lb 1.6 oz (78.1 kg)  01/05/19 174 lb (78.9 kg)    General: Patient appears comfortable at rest. Neck: Supple, no elevated JVP or carotid bruits, no thyromegaly. Lungs: Clear to auscultation, nonlabored breathing at rest. Cardiac: Regular rate and rhythm, no S3 or significant systolic murmur, no pericardial rub. Extremities: No pitting edema, distal pulses 2+. Skin: Warm and dry. Musculoskeletal: No kyphosis. Neuropsychiatric: Alert and oriented x3, affect grossly appropriate.  ECG:  Recent EKG on Apr 14, 2020 showed normal sinus rhythm rate of 78, left posterior fascicular block, abnormal QRS/T angle, consider primary T wave abnormality.  Axis shift and IVCD instead of LBBB  Recent Labwork: 04/13/2020: ALT 42; AST 62; B Natriuretic Peptide 364.0; Magnesium 2.0; TSH 1.277 04/15/2020: BUN 19; Creatinine, Ser 1.12; Hemoglobin 13.3; Platelets 298; Potassium 4.3; Sodium 137     Component Value Date/Time   CHOL 172 04/14/2020 0903   TRIG 52 04/14/2020 0903   HDL 62 04/14/2020 0903   CHOLHDL 2.8 04/14/2020 0903   VLDL 10 04/14/2020 0903   LDLCALC 100 (H) 04/14/2020 0903    Other Studies Reviewed Today:  Echocardiogram 04/13/2020  1. Right ventricular systolic function is normal. The right ventricular size is normal. 2. The anteroseptal wall is severely hypokinetic to akinetic.More pronounced finding that would be seen with her LBBB alone. Left ventricular ejection fraction, by estimation, is 45%. The left ventricle has mildly decreased function. The left ventricle demonstrates regional wall motion abnormalities (see scoring diagram/findings for description). There is moderate  left ventricular hypertrophy. Left ventricular diastolic parameters are consistent with Grade II diastolic dysfunction (pseudonormalization). 3. Left atrial size was mildly dilated. 4. The mitral valve is normal in structure. No evidence of mitral valve regurgitation. No evidence of mitral stenosis. 5. The aortic valve is tricuspid. Aortic valve regurgitation is not visualized. No aortic stenosis is present. 6. The inferior vena cava is normal in size with greater than 50% respiratory variability, suggesting right atrial pressure of 3 mmHg.   Cardiac catheterization 0000000  LV end diastolic pressure is normal.   1. Normal coronary anatomy.  2. Normal LVEDP 10 mm Hg  Plan: medical management with BP control. Anticipate DC today.   Assessment and Plan:  1. Acute on chronic combined systolic (congestive) and diastolic (congestive) heart failure ([place])   2. NICM (nonischemic cardiomyopathy) ([place])   3. HTN (hypertension), benign   4. SVT (supraventricular tachycardia) ([place])   5. LBBB (left bundle branch block)    1. Acute on chronic combined systolic (congestive) and diastolic (congestive) heart failure ([place]) Recent admission for acute combined systolic and diastolic heart failure.  Recent echo showed EF of 45% anteroseptal wall was severely hypokinetic to akinetic.,  With regional wall motion abnormalities.  Moderate LVH, grade 2 diastolic dysfunction.  Continue carvedilol 3.125 mg p.o. twice daily.  Continue losartan 50 mg daily.  Advised patient to continue to monitor weight and report weight increase of 3 pounds in 24 hours or 5 pounds in 1 week.  Restrict salt intake, fluid intake of at 60 cc/day.  2. NICM (nonischemic cardiomyopathy) ([place]) Recent echo as mentioned above with EF of 45%.  Recent cardiac catheterization showed normal cardiac anatomy.  3. HTN (hypertension), benign Blood pressure elevated today on arrival at 160/96.  Patient states she only took her  antihypertensive medication approximately 30 minutes before arriving.  States she has been rushing to get here.  This may have contributed to her blood pressure being elevated.  Recheck in left arm was 160/97.  Advised patient to start taking her antihypertensive medication earlier in the day.  4. SVT (supraventricular tachycardia) (H[facility]) Patient is currently wearing a 30-day event monitor.  She changes out the first monitor on June 11.  The second placement will occur on the 11th and last through 25 June.  She denies any recent palpitations.  5. LBBB (left bundle branch block) Recent EKG on Apr 14, 2020 showed normal sinus rhythm rate of 78, left posterior fascicular block, abnormal QRS/T angle, consider primary T wave abnormality.  Axis shift and IVCD instead of LBBB   Medication Adjustments/Labs and Tests Ordered: Current medicines are reviewed at length with the patient today.  Concerns regarding medicines are outlined above.   Disposition: Follow-up with Dr. Harl Bowie or APP 3 months  Signed, Levell July, NP 04/25/2020 12:45 PM    [facility] [place], [place], [place] [address] Phone: [phone]; Fax: [phone]

## 2020-04-25 ENCOUNTER — Encounter: Payer: Self-pay | Admitting: Family Medicine

## 2020-04-25 ENCOUNTER — Other Ambulatory Visit: Payer: Self-pay

## 2020-04-25 ENCOUNTER — Ambulatory Visit (INDEPENDENT_AMBULATORY_CARE_PROVIDER_SITE_OTHER): Payer: Medicare Other | Admitting: Family Medicine

## 2020-04-25 VITALS — BP 160/96 | HR 82 | Ht 65.0 in | Wt 174.0 lb

## 2020-04-25 DIAGNOSIS — I471 Supraventricular tachycardia: Secondary | ICD-10-CM

## 2020-04-25 DIAGNOSIS — I428 Other cardiomyopathies: Secondary | ICD-10-CM

## 2020-04-25 DIAGNOSIS — I10 Essential (primary) hypertension: Secondary | ICD-10-CM | POA: Diagnosis not present

## 2020-04-25 DIAGNOSIS — I447 Left bundle-branch block, unspecified: Secondary | ICD-10-CM | POA: Diagnosis not present

## 2020-04-25 DIAGNOSIS — I5043 Acute on chronic combined systolic (congestive) and diastolic (congestive) heart failure: Secondary | ICD-10-CM | POA: Diagnosis not present

## 2020-04-25 NOTE — Patient Instructions (Signed)
Medication Instructions:  Your physician recommends that you continue on your current medications as directed. Please refer to the Current Medication list given to you today.  *If you need a refill on your cardiac medications before your next appointment, please call your pharmacy*   Lab Work: None ordered   If you have labs (blood work) drawn today and your tests are completely normal, you will receive your results only by: Marland Kitchen MyChart Message (if you have MyChart) OR . A paper copy in the mail If you have any lab test that is abnormal or we need to change your treatment, we will call you to review the results.   Testing/Procedures: None ordered    Follow-Up: At [facility], you and your health needs are our priority.  As part of our continuing mission to provide you with exceptional heart care, we have created designated Provider Care Teams.  These Care Teams include your primary Cardiologist (physician) and Advanced Practice Providers (APPs -  Physician Assistants and Nurse Practitioners) who all work together to provide you with the care you need, when you need it.  We recommend signing up for the patient portal called "MyChart".  Sign up information is provided on this After Visit Summary.  MyChart is used to connect with patients for Virtual Visits (Telemedicine).  Patients are able to view lab/test results, encounter notes, upcoming appointments, etc.  Non-urgent messages can be sent to your provider as well.   To learn more about what you can do with MyChart, go to [email or web address].    Your next appointment:   3 month(s)  The format for your next appointment:   In Person  Provider:   You may see Carlyle Dolly, MD or one of the following Advanced Practice Providers on your designated Care Team:    Bernerd Pho, PA-C   Ermalinda Barrios, [place]     Other Instructions None

## 2020-04-27 DIAGNOSIS — E1165 Type 2 diabetes mellitus with hyperglycemia: Secondary | ICD-10-CM | POA: Diagnosis not present

## 2020-04-27 DIAGNOSIS — Z79899 Other long term (current) drug therapy: Secondary | ICD-10-CM | POA: Diagnosis not present

## 2020-04-27 DIAGNOSIS — I5021 Acute systolic (congestive) heart failure: Secondary | ICD-10-CM | POA: Diagnosis not present

## 2020-04-27 DIAGNOSIS — I10 Essential (primary) hypertension: Secondary | ICD-10-CM | POA: Diagnosis not present

## 2020-04-27 DIAGNOSIS — Z0001 Encounter for general adult medical examination with abnormal findings: Secondary | ICD-10-CM | POA: Diagnosis not present

## 2020-05-01 ENCOUNTER — Ambulatory Visit: Payer: Medicare Other | Admitting: Family Medicine

## 2020-05-01 ENCOUNTER — Other Ambulatory Visit: Payer: Self-pay | Admitting: *Deleted

## 2020-05-01 NOTE — Patient Outreach (Addendum)
[facility]) Care Management  05/01/2020  Michelle Morrison Mar 21, 1961 [id number]   Subjective: Telephone call to patient's home / mobile number, spoke with patient, and HIPAA verified.  Patient states she is doing alright, currently at work, requested call back at a later time, before 12:00pm Monday - Friday, or after 6:00pm Monday - Friday.      Objective: Per KPN (Knowledge Performance Now, point of care tool) and chart review, patient hospitalized 04/13/2020 - 04/15/2020 for  Dyspnea due to congestive heart failure, Acute on chronic diastolic heart failure.   Patient has a history of hypertension, hyperlipidemia, former tobacco abuse,  and diabetes ( A1C 7.3 on 04/13/2020) .       Assessment: Received [facility] EMMI General Discharge Red Flag Alert follow up referral on 04/21/2020.  Red Flag Alert Trigger, Day # 4, patient answered yes to the following question: Lost interest in things?  [facility] EMMI follow up pending patient contact.      Plan: RNCM will send unsuccessful outreach letter, [facility] pamphlet, handout: Know Before You Go, will call patient for 2nd telephone outreach attempt within 4 business days, [facility] EMMI follow up, and proceed with case closure, within 10 business days if no return call.      Ezeriah Luty H. Annia Friendly, BSN, [place] [facility] Telephonic CM Phone: [phone] Fax: [phone]

## 2020-05-02 ENCOUNTER — Ambulatory Visit: Payer: Self-pay | Admitting: *Deleted

## 2020-05-03 ENCOUNTER — Other Ambulatory Visit: Payer: Self-pay | Admitting: *Deleted

## 2020-05-03 ENCOUNTER — Encounter: Payer: Self-pay | Admitting: *Deleted

## 2020-05-03 NOTE — Patient Outreach (Signed)
[place] [facility]) Care Management  05/03/2020  Michelle Morrison Jun 17, 1961 [id number]   Subjective: Telephone call to patient's home / mobile number, spoke with patient, and HIPAA verified.  Discussed [place] Medicare EMMI General Discharge Red Flag Alert follow up, patient voiced understanding, and is in agreement to follow up.   Patient states she does not remember receiving EMMI automated calls and does not have a lost interest in things.   Patient states she is doing good, had a follow up appointment with primary MD on 04/27/2020, appointment went well, and next appointment will be on 07/04/2020.   States she also had a follow up appointment with cardiologist on 04/25/2020, appointment went well, and next appointment will be on 07/27/2020. Patient states she is able to manage self care and has assistance as needed. Patient voices understanding of medical diagnosis and treatment plan. States she is accessing her [employer] benefits as needed via member services number on back of card. Patient states she does not have any education material, EMMI follow up, care coordination, care management, disease monitoring, transportation, community resource, or pharmacy needs at this time. States she is very appreciative of the follow up, is in agreement  to receive 1 additional follow up call to assess for further CM needs, and is in agreement to receive [place] Management EMMI follow up calls as needed.  Patient states she will contact RNCM if needed prior to next patient outreach.    Objective: Per KPN (Knowledge Performance Now, point of care tool) and chart review, patient hospitalized 04/13/2020 - 04/15/2020 for  Dyspnea due to congestive heart failure, Acute on chronic diastolic heart failure.   Patient has a history of hypertension, hyperlipidemia, former tobacco abuse,  and diabetes ( A1C 7.3 on 04/13/2020) .       Assessment: Received [employer] EMMI General Discharge Red Flag Alert follow up referral on 04/21/2020.  Red Flag Alert Trigger, Day # 4, patient answered yes to the following question: Lost interest in things?   EMMI follow up completed and will follow up to assess further care management needs.     Plan: RNCM will call patient for telephone outreach attempt, within 21 business days, EMMI follow up, to assess for further CM needs, and proceed with case closure, within 10 business days if no return call, after 4th unsuccessful outreach.      Erynne Kealey H. Annia Friendly, BSN, [place] Management [facility] Telephonic CM Phone: [phone] Fax: [phone]

## 2020-05-19 ENCOUNTER — Telehealth: Payer: Self-pay | Admitting: *Deleted

## 2020-05-19 NOTE — Telephone Encounter (Signed)
Call received from [employer] stating that pt had 14 sec. Run of V-Tach. Strip given to Dr. Harl Bowie to review.

## 2020-05-19 NOTE — Telephone Encounter (Signed)
Received critical report from [employer] cardiac monitoring about an episode of vtach. Telemetry reviewed, shows atrial tachycardia with her known chronic conduction delay, this is not ventricular tachcyardia   Carlyle Dolly MD

## 2020-05-23 ENCOUNTER — Ambulatory Visit: Payer: Self-pay | Admitting: *Deleted

## 2020-05-23 ENCOUNTER — Ambulatory Visit: Payer: Medicare Other

## 2020-05-23 ENCOUNTER — Other Ambulatory Visit: Payer: Self-pay

## 2020-05-23 DIAGNOSIS — I471 Supraventricular tachycardia: Secondary | ICD-10-CM

## 2020-05-24 ENCOUNTER — Other Ambulatory Visit: Payer: Self-pay | Admitting: *Deleted

## 2020-05-24 NOTE — Patient Outreach (Signed)
[name]  05/24/2020  Michelle Morrison June 04, 1961 [id number]   Subjective: Telephone call to patient's home  / mobile number, no answer, left HIPAA compliant voicemail message, and requested call back.    Objective:Per KPN (Knowledge Performance Now, point of care tool) and chart review,patient hospitalized 04/13/2020 - 04/15/2020 forDyspnea due to congestive heart failure,Acute on chronic diastolic heart failure. Patient has a history of hypertension,hyperlipidemia, former tobacco abuse,and diabetes ( A1C 7.3 on 04/13/2020).      Assessment: Received [employer] EMMI General Discharge Red Flag Alert follow up referral on 04/21/2020. Red Flag Alert Trigger, Day # 4, patient answered yes to the following question: Lost interest in things? EMMI follow up completed and will follow up to assess further care management needs.     Plan:RNCM will call patient for 2nd telephone outreach attempt, within 4 business days, EMMI follow up, to assess for further CM needs, and proceed with case closure, within 10 business days if no return call, after 4th unsuccessful outreach.     Michelle Morrison H. Annia Friendly, BSN, [place] Management [facility] Telephonic CM Phone: [phone] Fax: [phone]

## 2020-05-27 DIAGNOSIS — I10 Essential (primary) hypertension: Secondary | ICD-10-CM | POA: Diagnosis not present

## 2020-05-27 DIAGNOSIS — E1165 Type 2 diabetes mellitus with hyperglycemia: Secondary | ICD-10-CM | POA: Diagnosis not present

## 2020-05-31 ENCOUNTER — Ambulatory Visit: Payer: Self-pay | Admitting: *Deleted

## 2020-06-02 ENCOUNTER — Other Ambulatory Visit: Payer: Self-pay | Admitting: *Deleted

## 2020-06-02 NOTE — Patient Outreach (Signed)
[place] [facility]) Care Management  06/02/2020  Michelle Morrison 10-Feb-1961 [id number]  Subjective: Telephone call to patient's home / mobile number, spoke with patient, and HIPAA verified.  Patient states she is fine, is currently at work, and requested call back at a later time.    Objective:Per KPN (Knowledge Performance Now, point of care tool) and chart review,patient hospitalized 04/13/2020 - 04/15/2020 forDyspnea due to congestive heart failure,Acute on chronic diastolic heart failure. Patient has a history of hypertension,hyperlipidemia, former tobacco abuse,and diabetes ( A1C 7.3 on 04/13/2020).      Assessment: Received [employer] EMMI General Discharge Red Flag Alert follow up referral on 04/21/2020. Red Flag Alert Trigger, Day # 4, patient answered yes to the following question: Lost interest in things?EMMI follow up completed and will follow up to assess further care management needs.     Plan:RNCM will call patient for 3rd telephone outreach attempt, within4business days, EMMI follow up, to assess for further CM needs, and proceed with case closure, within 10 business days if no return call, after 4th unsuccessful outreach.     Tyanna Hach H. Annia Friendly, BSN, [place] Management [facility] Telephonic CM Phone: [phone] Fax: [phone]

## 2020-06-07 ENCOUNTER — Other Ambulatory Visit: Payer: Self-pay | Admitting: *Deleted

## 2020-06-07 NOTE — Patient Outreach (Signed)
[place] [facility]) Care Management  06/07/2020  Michelle Morrison 1961-05-15 [id number]   Subjective: Telephone call to patient's home / mobile number, spoke with patient, and HIPAA verified.  Patient states she is doing great, no issues, has gone to all hospital follow up appointments, will see primary MD on 07/04/2020, and cardiologist on 08/15/2020.   Patient states she does not have any education material, EMMI follow up, care coordination, care management, disease monitoring, transportation, community resource, or pharmacy needs at this time.  Patient is aware 30 day EMMI automated calls completed and states she has no further care management needs at this time.  States she is very appreciative of the follow up and will notify [name] if services needed in the future.       Objective:Per KPN (Knowledge Performance Now, point of care tool) and chart review,patient hospitalized 04/13/2020 - 04/15/2020 forDyspnea due to congestive heart failure,Acute on chronic diastolic heart failure. Patient has a history of hypertension,hyperlipidemia, former tobacco abuse,and diabetes ( A1C 7.3 on 04/13/2020).      Assessment: Received [employer] EMMI General Discharge Red Flag Alert follow up referral on 04/21/2020. Red Flag Alert Trigger, Day # 4, patient answered yes to the following question: Lost interest in things?EMMI follow up completed and no further care management needs.      Plan: RNCM will complete case closure due to follow up completed / no care management needs.  RNCM will send MD case closure letter.        [name], BSN, [place] Management [name] Telephonic CM Phone: [phone] Fax: [phone]

## 2020-06-09 ENCOUNTER — Emergency Department (HOSPITAL_COMMUNITY): Payer: Medicare Other

## 2020-06-09 ENCOUNTER — Other Ambulatory Visit: Payer: Self-pay

## 2020-06-09 ENCOUNTER — Encounter (HOSPITAL_COMMUNITY): Payer: Self-pay | Admitting: Emergency Medicine

## 2020-06-09 ENCOUNTER — Observation Stay (HOSPITAL_COMMUNITY)
Admission: EM | Admit: 2020-06-09 | Discharge: 2020-06-10 | Disposition: A | Discharge status: Home / Self Care | Payer: Medicare Other | Attending: Internal Medicine | Admitting: Internal Medicine

## 2020-06-09 DIAGNOSIS — Z79899 Other long term (current) drug therapy: Secondary | ICD-10-CM | POA: Insufficient documentation

## 2020-06-09 DIAGNOSIS — I5042 Chronic combined systolic (congestive) and diastolic (congestive) heart failure: Secondary | ICD-10-CM

## 2020-06-09 DIAGNOSIS — I160 Hypertensive urgency: Secondary | ICD-10-CM | POA: Diagnosis not present

## 2020-06-09 DIAGNOSIS — Z87891 Personal history of nicotine dependence: Secondary | ICD-10-CM | POA: Diagnosis not present

## 2020-06-09 DIAGNOSIS — R000 Tachycardia, unspecified: Secondary | ICD-10-CM

## 2020-06-09 DIAGNOSIS — I471 Supraventricular tachycardia: Secondary | ICD-10-CM | POA: Diagnosis not present

## 2020-06-09 DIAGNOSIS — E119 Type 2 diabetes mellitus without complications: Secondary | ICD-10-CM | POA: Insufficient documentation

## 2020-06-09 DIAGNOSIS — I5043 Acute on chronic combined systolic (congestive) and diastolic (congestive) heart failure: Secondary | ICD-10-CM

## 2020-06-09 DIAGNOSIS — I110 Hypertensive heart disease with heart failure: Secondary | ICD-10-CM | POA: Diagnosis not present

## 2020-06-09 DIAGNOSIS — Z7982 Long term (current) use of aspirin: Secondary | ICD-10-CM | POA: Insufficient documentation

## 2020-06-09 DIAGNOSIS — Z20822 Contact with and (suspected) exposure to covid-19: Secondary | ICD-10-CM | POA: Insufficient documentation

## 2020-06-09 DIAGNOSIS — R0602 Shortness of breath: Secondary | ICD-10-CM | POA: Diagnosis not present

## 2020-06-09 DIAGNOSIS — E1165 Type 2 diabetes mellitus with hyperglycemia: Secondary | ICD-10-CM | POA: Diagnosis not present

## 2020-06-09 DIAGNOSIS — R7989 Other specified abnormal findings of blood chemistry: Secondary | ICD-10-CM

## 2020-06-09 DIAGNOSIS — F17200 Nicotine dependence, unspecified, uncomplicated: Secondary | ICD-10-CM | POA: Diagnosis not present

## 2020-06-09 DIAGNOSIS — R778 Other specified abnormalities of plasma proteins: Secondary | ICD-10-CM

## 2020-06-09 HISTORY — DX: Heart failure, unspecified: I50.9

## 2020-06-09 LAB — CBC WITH DIFFERENTIAL/PLATELET
Abs Immature Granulocytes: 0.04 10*3/uL (ref 0.00–0.07)
Basophils Absolute: 0.1 10*3/uL (ref 0.0–0.1)
Basophils Relative: 0 %
Eosinophils Absolute: 0.1 10*3/uL (ref 0.0–0.5)
Eosinophils Relative: 1 %
HCT: 42.9 % (ref 36.0–46.0)
Hemoglobin: 13.6 g/dL (ref 12.0–15.0)
Immature Granulocytes: 0 %
Lymphocytes Relative: 8 %
Lymphs Abs: 1 10*3/uL (ref 0.7–4.0)
MCH: 29.1 pg (ref 26.0–34.0)
MCHC: 31.7 g/dL (ref 30.0–36.0)
MCV: 91.9 fL (ref 80.0–100.0)
Monocytes Absolute: 0.5 10*3/uL (ref 0.1–1.0)
Monocytes Relative: 4 %
Neutro Abs: 10.7 10*3/uL — ABNORMAL HIGH (ref 1.7–7.7)
Neutrophils Relative %: 87 %
Platelets: 271 10*3/uL (ref 150–400)
RBC: 4.67 MIL/uL (ref 3.87–5.11)
RDW: 13.7 % (ref 11.5–15.5)
WBC: 12.4 10*3/uL — ABNORMAL HIGH (ref 4.0–10.5)
nRBC: 0 % (ref 0.0–0.2)

## 2020-06-09 LAB — URINALYSIS, COMPLETE (UACMP) WITH MICROSCOPIC
Bacteria, UA: NONE SEEN
Bilirubin Urine: NEGATIVE
Glucose, UA: NEGATIVE mg/dL
Ketones, ur: NEGATIVE mg/dL
Nitrite: NEGATIVE
Protein, ur: 100 mg/dL — AB
Specific Gravity, Urine: 1.011 (ref 1.005–1.030)
pH: 5 (ref 5.0–8.0)

## 2020-06-09 LAB — RAPID URINE DRUG SCREEN, HOSP PERFORMED
Amphetamines: NOT DETECTED
Barbiturates: NOT DETECTED
Benzodiazepines: NOT DETECTED
Cocaine: NOT DETECTED
Opiates: NOT DETECTED
Tetrahydrocannabinol: NOT DETECTED

## 2020-06-09 LAB — BASIC METABOLIC PANEL
Anion gap: 10 (ref 5–15)
BUN: 16 mg/dL (ref 6–20)
CO2: 25 mmol/L (ref 22–32)
Calcium: 8.8 mg/dL — ABNORMAL LOW (ref 8.9–10.3)
Chloride: 102 mmol/L (ref 98–111)
Creatinine, Ser: 0.74 mg/dL (ref 0.44–1.00)
GFR calc Af Amer: >60 mL/min (ref >60)
GFR calc non Af Amer: >60 mL/min (ref >60)
Glucose, Bld: 122 mg/dL — ABNORMAL HIGH (ref 70–99)
Potassium: 3.9 mmol/L (ref 3.5–5.1)
Sodium: 137 mmol/L (ref 135–145)

## 2020-06-09 LAB — BRAIN NATRIURETIC PEPTIDE: B Natriuretic Peptide: 260 pg/mL — ABNORMAL HIGH (ref 0.0–100.0)

## 2020-06-09 LAB — SARS CORONAVIRUS 2 BY RT PCR (HOSPITAL ORDER, PERFORMED IN ~~LOC~~ HOSPITAL LAB): SARS Coronavirus 2: NEGATIVE

## 2020-06-09 LAB — GLUCOSE, CAPILLARY
Glucose-Capillary: 89 mg/dL (ref 70–99)
Glucose-Capillary: 98 mg/dL (ref 70–99)
Glucose-Capillary: 120 mg/dL — ABNORMAL HIGH (ref 70–99)

## 2020-06-09 LAB — TROPONIN I (HIGH SENSITIVITY)
Troponin I (High Sensitivity): 34 ng/L — ABNORMAL HIGH (ref <18)
Troponin I (High Sensitivity): 46 ng/L — ABNORMAL HIGH (ref <18)

## 2020-06-09 MED ORDER — LABETALOL HCL 5 MG/ML IV SOLN
20.0000 mg | Freq: Once | INTRAVENOUS | Status: AC
  Administered 2020-06-09: 20 mg via INTRAVENOUS
  Filled 2020-06-09: qty 4

## 2020-06-09 MED ORDER — CARVEDILOL 3.125 MG PO TABS: 3.1250 mg | ORAL_TABLET | Freq: Two times a day (BID) | ORAL | Status: DC

## 2020-06-09 MED ORDER — ISOSORBIDE MONONITRATE ER 60 MG PO TB24
30.0000 mg | ORAL_TABLET | Freq: Every day | ORAL | Status: DC
  Administered 2020-06-09 – 2020-06-10 (×2): 30 mg via ORAL
  Filled 2020-06-09 (×5): qty 1

## 2020-06-09 MED ORDER — SODIUM CHLORIDE 0.9 % IV SOLN: 250.0000 mL | INTRAVENOUS | Status: DC | PRN

## 2020-06-09 MED ORDER — SODIUM CHLORIDE 0.9% FLUSH
3.0000 mL | Freq: Two times a day (BID) | INTRAVENOUS | Status: DC
  Administered 2020-06-09 – 2020-06-10 (×2): 3 mL via INTRAVENOUS

## 2020-06-09 MED ORDER — LOSARTAN POTASSIUM 50 MG PO TABS: 50.0000 mg | ORAL_TABLET | Freq: Every day | ORAL | Status: DC

## 2020-06-09 MED ORDER — FUROSEMIDE 10 MG/ML IJ SOLN
40.0000 mg | Freq: Once | INTRAMUSCULAR | Status: AC
  Administered 2020-06-09: 40 mg via INTRAVENOUS
  Filled 2020-06-09: qty 4

## 2020-06-09 MED ORDER — ISOSORBIDE MONONITRATE ER 60 MG PO TB24: 30.0000 mg | ORAL_TABLET | Freq: Every day | ORAL | Status: DC

## 2020-06-09 MED ORDER — CARVEDILOL 3.125 MG PO TABS
3.1250 mg | ORAL_TABLET | Freq: Two times a day (BID) | ORAL | Status: DC
  Filled 2020-06-09 (×7): qty 1

## 2020-06-09 MED ORDER — CARVEDILOL 3.125 MG PO TABS: 6.2500 mg | ORAL_TABLET | Freq: Two times a day (BID) | ORAL | Status: DC

## 2020-06-09 MED ORDER — INSULIN ASPART 100 UNIT/ML ~~LOC~~ SOLN
0.0000 [IU] | Freq: Three times a day (TID) | SUBCUTANEOUS | Status: DC
  Administered 2020-06-10: 1 [IU] via SUBCUTANEOUS

## 2020-06-09 MED ORDER — FUROSEMIDE 10 MG/ML IJ SOLN
60.0000 mg | Freq: Once | INTRAMUSCULAR | Status: AC
  Administered 2020-06-09: 60 mg via INTRAVENOUS
  Filled 2020-06-09: qty 6

## 2020-06-09 MED ORDER — ASPIRIN 81 MG PO CHEW
81.0000 mg | CHEWABLE_TABLET | Freq: Once | ORAL | Status: AC
  Administered 2020-06-09: 81 mg via ORAL
  Filled 2020-06-09: qty 1

## 2020-06-09 MED ORDER — HYDRALAZINE HCL 20 MG/ML IJ SOLN: 10.0000 mg | Freq: Four times a day (QID) | INTRAMUSCULAR | Status: DC | PRN

## 2020-06-09 MED ORDER — ATORVASTATIN CALCIUM 40 MG PO TABS
80.0000 mg | ORAL_TABLET | Freq: Every day | ORAL | Status: DC
  Administered 2020-06-09 – 2020-06-10 (×2): 80 mg via ORAL
  Filled 2020-06-09: qty 2

## 2020-06-09 MED ORDER — LINAGLIPTIN 5 MG PO TABS
5.0000 mg | ORAL_TABLET | Freq: Every day | ORAL | Status: DC
  Administered 2020-06-09 – 2020-06-10 (×2): 5 mg via ORAL
  Filled 2020-06-09 (×5): qty 1

## 2020-06-09 MED ORDER — LOSARTAN POTASSIUM 50 MG PO TABS
50.0000 mg | ORAL_TABLET | Freq: Every day | ORAL | Status: DC
  Administered 2020-06-09 – 2020-06-10 (×2): 50 mg via ORAL
  Filled 2020-06-09: qty 1
  Filled 2020-06-09: qty 2

## 2020-06-09 MED ORDER — CARVEDILOL 3.125 MG PO TABS
6.2500 mg | ORAL_TABLET | Freq: Two times a day (BID) | ORAL | Status: DC
  Administered 2020-06-09 – 2020-06-10 (×2): 6.25 mg via ORAL
  Filled 2020-06-09 (×2): qty 2

## 2020-06-09 MED ORDER — FUROSEMIDE 10 MG/ML IJ SOLN
20.0000 mg | Freq: Every day | INTRAMUSCULAR | Status: DC
  Administered 2020-06-10: 20 mg via INTRAVENOUS
  Filled 2020-06-09: qty 2

## 2020-06-09 MED ORDER — ENOXAPARIN SODIUM 40 MG/0.4ML ~~LOC~~ SOLN
40.0000 mg | SUBCUTANEOUS | Status: DC
  Administered 2020-06-09: 40 mg via SUBCUTANEOUS
  Filled 2020-06-09 (×2): qty 0.4

## 2020-06-09 MED ORDER — ASPIRIN 81 MG PO CHEW
81.0000 mg | CHEWABLE_TABLET | Freq: Every day | ORAL | Status: DC
  Administered 2020-06-10: 81 mg via ORAL
  Filled 2020-06-09: qty 1

## 2020-06-09 MED ORDER — SODIUM CHLORIDE 0.9% FLUSH: 3.0000 mL | INTRAVENOUS | Status: DC | PRN

## 2020-06-09 MED ORDER — ACETAMINOPHEN 325 MG PO TABS: 650.0000 mg | ORAL_TABLET | ORAL | Status: DC | PRN

## 2020-06-09 MED ORDER — CARVEDILOL 3.125 MG PO TABS: 9.3750 mg | ORAL_TABLET | Freq: Two times a day (BID) | ORAL | Status: DC

## 2020-06-09 MED ORDER — ONDANSETRON HCL 4 MG/2ML IJ SOLN: 4.0000 mg | Freq: Four times a day (QID) | INTRAMUSCULAR | Status: DC | PRN

## 2020-06-09 NOTE — ED Notes (Signed)
Cardiology at bedside.

## 2020-06-09 NOTE — Consult Note (Signed)
Cardiology Consult    Patient ID: DEAN WONDER; [id number]; 10/30/1961   Admit date: 06/09/2020 Date of Consult: 06/09/2020  Primary Care Provider: Rosita Fire, MD Primary Cardiologist: Carlyle Dolly, MD   Patient Profile    Michelle Morrison is a 59 y.o. female with past medical history of chronic combined systolic and diastolic CHF/NICM (EF 40% by echo in 03/2020 with normal cors by cath), atrial tachycardia by recent event monitor, LBBB, HTN, HLD and Type 2 DM who is being seen today for Michelle evaluation of CHF at Michelle request of Dr. [name].   History of Present Illness    Michelle Morrison was examined by Katina Dung, NP on 04/25/2020 for hospital follow-up from her recent CHF exacerbation during which her EF was found to be reduced at 45% and she was found to have a new LBBB. Cardiac catheterization during  that admission showed normal coronary arteries. She was discharged on ASA 81mg  daily, Coreg 3.125mg  BID, Imdur 30mg  daily, and Losartan 50mg  daily. BP was elevated at 160/96 at Michelle time of her visit but she reported having just taken her medications prior to arrival and her medications were not adjusted at Michelle time of her visit.   Michelle Morrison says she had been doing OK   Works in day care  Busy. Takes her BP daily at home   BPs 140s - 150s  (did not take it yesterday when she was feeling bad) ALso weighs herself daily and this has been stable (170lb, was 172 at d/c in May)   Watchs salt  Yesterday she got a little SOB   Took extra inhaler   Still not feeling right    SOB with minmal activity  New  Not as bad as when she went to hsop earlier this spring   She presented to [facility] ED during Michelle early morning hours of 06/09/2020 for evaluation of worsening dyspnea which started yesterday. Denied any associated chest pain or palpitations. No specific orthopnea, PND or edema.     In ED, BP was significantly elevated at 222/115.  . Initial labs show WBC 12.4, Hgb 13.6, platelets 271, Na+ 137, K+ 3.9  and creatinine 0.74 (at 1.12 at Michelle time of hospital discharge in 03/2020). BNP 260. Initial and delta HS Troponin values have been flat at 34 and 46. COVID pending. CXR shows no acute cardiopulmonary abnormalities. EKG shows sinus tachycardia, HR 115 with LVH and known LBBB.   She received IV Labetalol 20mg  and her PTA Coreg 3.125mg  BID, Imdur 30mg  and Losartan 50mg  have been ordered. Also received IV Lasix 40mg . BP improved to 168/76 on most recent check.   Again, she says she has not missed doses   Past Medical History:  Diagnosis Date  . CHF (congestive heart failure) ([place])    a. EF 45% by echo in 03/2020 with normal cors by cath  . Diabetes mellitus without complication ([place])   . Diarrhea   . Herpes simplex infection   . Hyperlipidemia   . Hypertension     Past Surgical History:  Procedure Laterality Date  . CESAREAN SECTION    . CHOLECYSTECTOMY    . COLONOSCOPY N/A 08/26/2014   Procedure: COLONOSCOPY;  Surgeon: [name], MD;  Location: [facility]ITE;  Service: Endoscopy;  Laterality: N/A;  9:30 AM - moved to 8:30 - [place]inger to notify Morrison  . LEFT HEART CATH AND CORONARY ANGIOGRAPHY N/A 04/14/2020   Procedure: LEFT HEART CATH AND CORONARY ANGIOGRAPHY;  Surgeon: [name], [name], MD;  Location: [place] CV LAB;  Service: Cardiovascular;  Laterality: N/A;     Home Medications:  Prior to Admission medications   Medication Sig Start Date End Date Taking? Authorizing Provider  albuterol (VENTOLIN HFA) 108 (90 Base) MCG/ACT inhaler Inhale 1-2 puffs into Michelle lungs every 4 (four) hours as needed. 05/30/20  Yes [provider]  aspirin 81 MG chewable tablet Chew 1 tablet (81 mg total) by mouth daily. 04/16/20  Yes Hosie Poisson, MD  atorvastatin (LIPITOR) 80 MG tablet Take 80 mg by mouth daily. 04/15/20  Yes [provider]  carvedilol (COREG) 3.125 MG tablet Take 1 tablet (3.125 mg total) by mouth 2 (two) times daily with a meal. 04/15/20  Yes Hosie Poisson, MD  isosorbide  mononitrate (IMDUR) 30 MG 24 hr tablet Take 1 tablet (30 mg total) by mouth daily. 04/15/20  Yes Hosie Poisson, MD  losartan (COZAAR) 50 MG tablet Take 1 tablet (50 mg total) by mouth daily. 04/16/20  Yes Hosie Poisson, MD  TRADJENTA 5 MG TABS tablet Take 5 mg by mouth daily.  07/28/16  Yes [provider]    Inpatient Medications: Scheduled Meds: . atorvastatin  80 mg Oral Daily  . carvedilol  3.125 mg Oral BID WC  . isosorbide mononitrate  30 mg Oral Daily  . linagliptin  5 mg Oral Daily  . losartan  50 mg Oral Daily   Continuous Infusions:  PRN Meds:   Allergies:   No Known Allergies  Social History:   Social History   Socioeconomic History  . Marital status: Single    Spouse name: Not on file  . Number of children: Not on file  . Years of education: Not on file  . Highest education level: Not on file  Occupational History  . Not on file  Tobacco Use  . Smoking status: Former Smoker    Types: Cigarettes  . Smokeless tobacco: Never Used  Vaping Use  . Vaping Use: Never used  Substance and Sexual Activity  . Alcohol use: No    Alcohol/week: 0.0 standard drinks  . Drug use: No  . Sexual activity: Not Currently    Birth control/protection: Post-menopausal  Other Topics Concern  . Not on file  Social History Narrative  . Not on file   Social Determinants of Health   Financial Resource Strain:   . Difficulty of Paying Living Expenses:   Food Insecurity:   . Worried About Charity fundraiser in Michelle Last Year:   . Arboriculturist in Michelle Last Year:   Transportation Needs: No Transportation Needs  . Lack of Transportation (Medical): No  . Lack of Transportation (Non-Medical): No  Physical Activity:   . Days of Exercise per Week:   . Minutes of Exercise per Session:   Stress:   . Feeling of Stress :   Social Connections:   . Frequency of Communication with Friends and Family:   . Frequency of Social Gatherings with Friends and Family:   . Attends  Religious Services:   . Active Member of Clubs or Organizations:   . Attends Archivist Meetings:   Marland Kitchen Marital Status:   Intimate Partner Violence:   . Fear of Current or Ex-Partner:   . Emotionally Abused:   Marland Kitchen Physically Abused:   . Sexually Abused:      Family History:    Family History  Problem Relation Age of Onset  . Hypertension Mother   .  Diabetes Mother   . Colon cancer Neg Hx       Review of Systems    General:  No chills, fever, night sweats or weight changes.  Cardiovascular:  No chest pain, edema, orthopnea, palpitations, paroxysmal nocturnal dyspnea. Positive for dyspnea on exertion.  Dermatological: No rash, lesions/masses Respiratory: No cough. Urologic: No hematuria, dysuria Abdominal:   No nausea, vomiting, diarrhea, bright red blood per rectum, melena, or hematemesis Neurologic:  No visual changes, wkns, changes in mental status. All other systems reviewed and are otherwise negative except as noted above.  Physical Exam/Data    Vitals:   06/09/20 0837 06/09/20 0901 06/09/20 0957 06/09/20 1000  BP: (!) 147/69 (!) 168/76 (!) 179/74 (!) 180/94  Pulse: (!) 53 68 62 69  Resp:  16 13 20   Temp:      TempSrc:      SpO2:  96% 96% 100%  Weight:      Height:       No intake or output data in Michelle 24 hours ending 06/09/20 1007 Filed Weights   06/09/20 0222  Weight: 77.1 kg   Body mass index is 32.12 kg/m.   General: Pleasant female appearing in NAD Psych: Normal affect. Neuro: Alert and oriented X 3. Moves all extremities spontaneously. HEENT: Normal  Neck: JVP is normal  NO bruits  Lungs:  MOving air well  Mild wheez  No rales   Heart: RRR no s3, s4, or murmurs. Abdomen: Soft, non-tender, non-distended, BS + x 4.  Extremities: No clubbing, cyanosis or edema. DP/Morrison/Radials 2+ and equal bilaterally.   EKG:  Michelle EKG was personally reviewed and demonstrates: Sinus tachycardia, HR 115 with LVH and known LBBB.   Tele:  SR     Labs/Studies       Relevant CV Studies:  Echocardiogram: 03/2020 IMPRESSIONS    1. Right ventricular systolic function is normal. Michelle right ventricular  size is normal.  2. Michelle anteroseptal wall is severely hypokinetic to akinetic.More  pronounced finding that would be seen with her LBBB alone. Left  ventricular ejection fraction, by estimation, is 45%. Michelle left ventricle  has mildly decreased function. Michelle left ventricle  demonstrates regional wall motion abnormalities (see scoring  diagram/findings for description). There is moderate left ventricular  hypertrophy. Left ventricular diastolic parameters are consistent with  Grade II diastolic dysfunction  (pseudonormalization).  3. Left atrial size was mildly dilated.  4. Michelle mitral valve is normal in structure. No evidence of mitral valve  regurgitation. No evidence of mitral stenosis.  5. Michelle aortic valve is tricuspid. Aortic valve regurgitation is not  visualized. No aortic stenosis is present.  6. Michelle inferior vena cava is normal in size with greater than 50%  respiratory variability, suggesting right atrial pressure of 3 mmHg.   Cardiac Catheterization: 04/2375  LV end diastolic pressure is normal.   1. Normal coronary anatomy.  2. Normal LVEDP 10 mm Hg  Plan: medical management with BP control. Anticipate DC today.  Event Monitor: 03/2020  30 day event monitor  Min HR 38, Max HR 163, Avg HR 72. Min HR occurred in early AM hours presumably while sleeping  Telemetry tracings show sinus rhythm and sinus bradycardia,several runs of atach with aberrancy up to 30 seconds.. Runs of atach followed by postconversion pauses up to 1.4 seconds.  5 beat run of NSVT  No symptoms reported   Laboratory Data:  Chemistry Recent Labs  Lab 06/09/20 0358  NA 137  K 3.9  CL  102  CO2 25  GLUCOSE 122*  BUN 16  CREATININE 0.74  CALCIUM 8.8*  GFRNONAA >60  GFRAA >60  ANIONGAP 10    No results for input(s): PROT, ALBUMIN, AST,  ALT, ALKPHOS, BILITOT in Michelle last 168 hours. Hematology Recent Labs  Lab 06/09/20 0358  WBC 12.4*  RBC 4.67  HGB 13.6  HCT 42.9  MCV 91.9  MCH 29.1  MCHC 31.7  RDW 13.7  PLT 271   Cardiac EnzymesNo results for input(s): TROPONINI in Michelle last 168 hours. No results for input(s): TROPIPOC in Michelle last 168 hours.  BNP Recent Labs  Lab 06/09/20 0358  BNP 260.0*    DDimer No results for input(s): DDIMER in Michelle last 168 hours.  Radiology/Studies:  DG Chest Port 1 View  Result Date: 06/09/2020 CLINICAL DATA:  59 year old female with shortness of breath since yesterday. Former smoker. EXAM: PORTABLE CHEST 1 VIEW COMPARISON:  Portable chest 04/13/2020 and earlier. FINDINGS: Portable AP upright view at 0345 hours. Lung volumes and mediastinal contours are within normal limits. Allowing for portable technique Michelle lungs are clear. No pneumothorax or pleural effusion. Paucity of bowel gas in Michelle upper abdomen. Cholecystectomy clips. No acute osseous abnormality identified. IMPRESSION: No acute cardiopulmonary abnormality. Electronically Signed   By: Genevie Ann M.D.   On: 06/09/2020 03:58     Assessment & Plan    1. Hypertensive Urgency  Morrison reports that he BP had been under fair control at home      Checks daily in AM    Yesterday SOB  When came to ED was severeely elevated   Question if BP lead to presentation or if BP elevated due to SOB Currenly her BP is improving with increased meds (labetalol added intravenously) I would recomm using meds and advancing doses to get tighter control at home (149F systolic) I have also asked Michelle Morrison to take her BP when she has any sysmptoms (SOB)  To see if elevated at home     May be able to treat on a prn basis.    For now would increase Coreg to 6.25 bid  Continue losartin  Not sure how much imdur is adding    Watch HR   May need to work with losartan instead    2. Chronic Combined Systolic and Diastolic CHF - EF was found to be reduced at 45% by  echo in 03/2020 with catheterization at that time showing normal cors as outlined above.  Morrison's still with mild wheeze on exam.   BNP minimally elevatd at 56   May reflect diastolic dysfunction in setting of severe HTN   May be primary I would give additional dose of IV lasix later doday   Follow     Discussed Na and fluid intake  Continue to weigh daily    3. LBBB - Present on last admit  Normal cath   4  Elevated trop   Trivial  Flat  Prob due to diastolic dysfunction in setting of severe HTN   4. Atrial Tachycardia - seen on recnet monitor   Nothing prolonged   COntinue tele    For questions or updates, please contact [place] Please consult [email or web address] for contact info under Cardiology/STEMI.   Dorris Carnes MD

## 2020-06-09 NOTE — Plan of Care (Signed)
  Problem: Education: Goal: Knowledge of General Education information will improve Description Including pain rating scale, medication(s)/side effects and non-pharmacologic comfort measures Outcome: Progressing   Problem: Health Behavior/Discharge Planning: Goal: Ability to manage health-related needs will improve Outcome: Progressing   

## 2020-06-09 NOTE — ED Provider Notes (Signed)
[facility] EMERGENCY DEPARTMENT Provider Note   CSN: [id number] Arrival date & time: 06/09/20  0151   History Chief Complaint  Patient presents with  . Shortness of Breath    Michelle Morrison is a 59 y.o. female.  The history is provided by the patient.  Shortness of Breath She has history of hypertension, diabetes, hyperlipidemia, combined systolic and diastolic heart failure and comes in because of shortness of breath.  She noticed that she was short of breath when talking on the telephone today.  She had been fine during the day and had no difficulty last night.  She denies any chest pain, heaviness, tightness, or pressure.  There has been no cough.  She denies fever or chills.  She has been compliant with her medications.  Past Medical History:  Diagnosis Date  . Diabetes mellitus without complication ([place])   . Diarrhea   . Herpes simplex infection   . Hyperlipidemia   . Hypertension     Patient Active Problem List   Diagnosis Date Noted  . Elevated troponin   . Dyspnea due to congestive heart failure ([place]) 04/13/2020  . Accelerated hypertension 04/13/2020  . Hyperglycemia due to diabetes mellitus ([place]) 04/13/2020  . 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 Herpes simplex infection 11/21/2015  . HTN (hypertension), benign 04/01/2013    Past Surgical History:  Procedure Laterality Date  . CESAREAN SECTION    . CHOLECYSTECTOMY    . COLONOSCOPY N/A 08/26/2014   Procedure: COLONOSCOPY;  Surgeon: [name], MD;  [facility];  Service: Endoscopy;  Laterality: N/A;  9:30 AM - moved to 8:30 - Ginger to notify pt  . LEFT HEART CATH AND CORONARY ANGIOGRAPHY N/A 04/14/2020   Procedure: LEFT HEART CATH AND CORONARY ANGIOGRAPHY;  Surgeon: [name], [name], MD;  Location: [place] CV LAB;  Service: Cardiovascular;  Laterality: N/A;     OB History    Gravida  2   Para  2   Term  1   Preterm  1   AB      Living  2     SAB      TAB      Ectopic       Multiple      Live Births  2           Family History  Problem Relation Age of Onset  . Hypertension Mother   . Diabetes Mother   . Colon cancer Neg Hx     Social History   Tobacco Use  . Smoking status: Former Smoker    Types: Cigarettes  . Smokeless tobacco: Never Used  Vaping Use  . Vaping Use: Never used  Substance Use Topics  . Alcohol use: No    Alcohol/week: 0.0 standard drinks  . Drug use: No    Home Medications Prior to Admission medications   Medication Sig Start Date End Date Taking? Authorizing Provider  aspirin 81 MG chewable tablet Chew 1 tablet (81 mg total) by mouth daily. 04/16/20   Hosie Poisson, MD  atorvastatin (LIPITOR) 80 MG tablet Take 80 mg by mouth daily. 04/15/20   [provider]  carvedilol (COREG) 3.125 MG tablet Take 1 tablet (3.125 mg total) by mouth 2 (two) times daily with a meal. 04/15/20   Hosie Poisson, MD  isosorbide mononitrate (IMDUR) 30 MG 24 hr tablet Take 1 tablet (30 mg total) by mouth daily. 04/15/20   Hosie Poisson, MD  losartan (COZAAR) 50 MG tablet  Take 1 tablet (50 mg total) by mouth daily. 04/16/20   Hosie Poisson, MD  TRADJENTA 5 MG TABS tablet Take 5 mg by mouth daily.  07/28/16   [provider]    Allergies    Patient has no known allergies.  Review of Systems   Review of Systems  Respiratory: Positive for shortness of breath.   All other systems reviewed and are negative.   Physical Exam Updated Vital Signs BP (S) (!) 222/115 Comment: Pt states she took her bp med prior to arrival.  Pulse (!) 106   Temp 98.1 F (36.7 C) (Oral)   Resp (!) 22   Ht 5\' 1"  (1.549 m)   Wt 77.1 kg   LMP 05/23/2007   SpO2 91%   BMI 32.12 kg/m   Physical Exam Vitals and nursing note reviewed.   59 year old female, resting comfortably and in no acute distress. Vital signs are significant for markedly elevated blood pressure and mildly elevated heart rate and respiratory rate. Oxygen saturation is 91%, which  is borderline hypoxic. Head is normocephalic and atraumatic. PERRLA, EOMI. Oropharynx is clear. Neck is nontender and supple without adenopathy or JVD. Back is nontender and there is no CVA tenderness. Lungs have bibasilar rales without wheezes or rhonchi. Chest is nontender. Heart has regular rate and rhythm without murmur. Abdomen is soft, flat, nontender without masses or hepatosplenomegaly and peristalsis is normoactive. Extremities have trace edema, full range of motion is present. Skin is warm and dry without rash. Neurologic: Mental status is normal, cranial nerves are intact, there are no motor or sensory deficits.  ED Results / Procedures / Treatments   Labs (all labs ordered are listed, but only abnormal results are displayed) Labs Reviewed  BASIC METABOLIC PANEL - Abnormal; Notable for the following components:      Result Value   Glucose, Bld 122 (*)    Calcium 8.8 (*)    All other components within normal limits  BRAIN NATRIURETIC PEPTIDE - Abnormal; Notable for the following components:   B Natriuretic Peptide 260.0 (*)    All other components within normal limits  CBC WITH DIFFERENTIAL/PLATELET - Abnormal; Notable for the following components:   WBC 12.4 (*)    Neutro Abs 10.7 (*)    All other components within normal limits  TROPONIN I (HIGH SENSITIVITY) - Abnormal; Notable for the following components:   Troponin I (High Sensitivity) 34 (*)    All other components within normal limits  TROPONIN I (HIGH SENSITIVITY) - Abnormal; Notable for the following components:   Troponin I (High Sensitivity) 46 (*)    All other components within normal limits    EKG EKG Interpretation  Date/Time:  Friday June 09 2020 02:30:09 EDT Ventricular Rate:  115 PR Interval:    QRS Duration: 127 QT Interval:  403 QTC Calculation: 558 R Axis:   89 Text Interpretation: Sinus tachycardia Left bundle branch block Baseline wander in lead(s) III When compared with ECG of 04/14/2020,  Left bundle branch block has replaced Left posterior fasicular block Confirmed by Delora Fuel ([id number]) on 06/09/2020 2:45:54 AM   Radiology DG Chest Port 1 View  Result Date: 06/09/2020 CLINICAL DATA:  59 year old female with shortness of breath since yesterday. Former smoker. EXAM: PORTABLE CHEST 1 VIEW COMPARISON:  Portable chest 04/13/2020 and earlier. FINDINGS: Portable AP upright view at 0345 hours. Lung volumes and mediastinal contours are within normal limits. Allowing for portable technique the lungs are clear. No pneumothorax or pleural effusion.  Paucity of bowel gas in the upper abdomen. Cholecystectomy clips. No acute osseous abnormality identified. IMPRESSION: No acute cardiopulmonary abnormality. Electronically Signed   By: Genevie Ann M.D.   On: 06/09/2020 03:58    Procedures Procedures  CRITICAL CARE Performed by: Delora Fuel Total critical care time: 40 minutes Critical care time was exclusive of separately billable procedures and treating other patients. Critical care was necessary to treat or prevent imminent or life-threatening deterioration. Critical care was time spent personally by me on the following activities: development of treatment plan with patient and/or surrogate as well as nursing, discussions with consultants, evaluation of patient's response to treatment, examination of patient, obtaining history from patient or surrogate, ordering and performing treatments and interventions, ordering and review of laboratory studies, ordering and review of radiographic studies, pulse oximetry and re-evaluation of patient's condition.  Medications Ordered in ED Medications  aspirin chewable tablet 81 mg (has no administration in time range)  carvedilol (COREG) tablet 3.125 mg (has no administration in time range)  isosorbide mononitrate (IMDUR) 24 hr tablet 30 mg (has no administration in time range)  losartan (COZAAR) tablet 50 mg (has no administration in time range)    linagliptin (TRADJENTA) tablet 5 mg (has no administration in time range)  atorvastatin (LIPITOR) tablet 80 mg (has no administration in time range)  furosemide (LASIX) injection 40 mg (40 mg Intravenous Given 06/09/20 0543)  labetalol (NORMODYNE) injection 20 mg (20 mg Intravenous Given 06/09/20 0160)    ED Course  I have reviewed the triage vital signs and the nursing notes.  Pertinent labs & imaging results that were available during my care of the patient were reviewed by me and considered in my medical decision making (see chart for details).  MDM Rules/Calculators/A&P Dyspnea and patient with history of heart failure.  There is minimal edema on exam but bibasilar rales and I suspect that this is CHF exacerbation possibly worsened because of elevated blood pressure.  Patient states that she does take her blood pressure daily and blood pressure this morning was in the 109N systolic.  Will check chest x-ray, ECG, screening labs.  Old records are reviewed confirming history of combined systolic and diastolic heart failure.  She had a hospital admission in May for similar problems.  Labs do show mildly elevated BNP and she is given a dose of furosemide.  Initial troponin is mildly elevated and will need to be trended.  Chest x-ray does not show overt pulmonary edema.  ECG shows left bundle branch block which has been present intermittently.  Patient had good diuresis with above-noted treatment, and she is now maintaining good oxygen saturations off of nasal oxygen.  She has been ambulatory in the department without difficulty.  However, blood pressure continues to stay markedly elevated.  Repeat troponin is trending upward.  This is felt to be due to demand ischemia and not due to ACS.  I do not see this is an indication for anticoagulation.  However, she is given a dose of labetalol for blood pressure control and will need to be admitted.  Case is discussed with Dr. Carles Collet of [facility], who  agrees to admit the patient.  Final Clinical Impression(s) / ED Diagnoses Final diagnoses:  Acute on chronic combined systolic (congestive) and diastolic (congestive) heart failure (HCC)  Hypertensive urgency  Elevated troponin    Rx / DC Orders ED Discharge Orders    None       Delora Fuel, MD 23/55/73 [phone]

## 2020-06-09 NOTE — ED Notes (Signed)
Hospitalist at bedside. Will administer AM medication after hospitalist assessment.

## 2020-06-09 NOTE — H&P (Signed)
History and Physical  Michelle Morrison TKP:[id number] DOB: Dec 19, 1960 DOA: 06/09/2020   PCP: Rosita Fire, MD   Patient coming from: Home  Chief Complaint: sob  HPI:  Michelle Morrison is a 59 y.o. female with medical history of systolic and diastolic CHF/NICM, diabetes mellitus type 2, hypertension, SVT presenting with 1 day history of shortness of breath that began on 06/08/2020.  Patient denied any headache, fevers, chills, cough, hemoptysis, chest pain nausea, vomiting, diarrhea.  She endorses compliance with all her medications.  She states that she has been weighing herself and states that her weight has been fluctuating between 170-172 pounds.  She denies any worsening lower extremity edema but does state that her clothes have been fitting tighter.  She denies any orthopnea or PND.  Patient had recent hospital admission from 04/13/2020 to 04/10/16 for acute systolic and diastolic CHF.  She underwent cardiac catheterization which showed normal coronaries.  The patient was discharged home with carvedilol, losartan, Imdur, and aspirin.  Her discharge weight was 70.1 kg. In the emergency department, the patient was afebrile hemodynamically stable with blood pressure up to 222/115.  The patient was given labetalol.  Oxygen saturation was 98-100% on room air.  BMP was unremarkable.  WBC was 12.4 with hemoglobin 13.6, platelets 271,000.  Troponin was 34>>> 46.  EKG shows sinus rhythm with left bundle branch block.  Chest x-ray showed increased vascular markings.  The patient was given furosemide 40 mg IV x1 with improvement of her shortness of breath.  Assessment/Plan: Acute on chronic systolic and diastolic CHF -Likely precipitated by the patient's uncontrolled hypertension -Continue IV furosemide -Daily weights -Accurate I's and O's -04/13/2020 echo EF 45%, grade 2 DD -Continue carvedilol, Imdur  Leukocytosis -She is afebrile hemodynamically stable -Likely stress demargination -Monitor  clinically -Obtain UA  Hypertensive urgency -Patient endorsed compliance with her medications -Continue carvedilol and Imdur and losartan -Hydralazine prn SBP >180 -May need titration of her antihypertensive medications -Urine drug screen  Diabetes mellitus type 2 -04/13/2020 hemoglobin A1c 7.3 -Holding Tradjenta -NovoLog sliding scale  SVT/atrial tachycardia -Monitor on telemetry -Patient had event monitor interpreted on 05/24/2020 showing several runs of atrial tachycardia and one 5 beat run of SVT        Past Medical History:  Diagnosis Date  . Diabetes mellitus without complication ([place])   . Diarrhea   . Herpes simplex infection   . Hyperlipidemia   . Hypertension    Past Surgical History:  Procedure Laterality Date  . CESAREAN SECTION    . CHOLECYSTECTOMY    . COLONOSCOPY N/A 08/26/2014   Procedure: COLONOSCOPY;  Surgeon: [name], MD;  [facility];  Service: Endoscopy;  Laterality: N/A;  9:30 AM - moved to 8:30 - Ginger to notify pt  . LEFT HEART CATH AND CORONARY ANGIOGRAPHY N/A 04/14/2020   Procedure: LEFT HEART CATH AND CORONARY ANGIOGRAPHY;  Surgeon: [name], [name], MD;  Location: [place] CV LAB;  Service: Cardiovascular;  Laterality: N/A;   Social History:  reports that she has quit smoking. Her smoking use included cigarettes. She has never used smokeless tobacco. She reports that she does not drink alcohol and does not use drugs.   Family History  Problem Relation Age of Onset  . Hypertension Mother   . Diabetes Mother   . Colon cancer Neg Hx      No Known Allergies   Prior to Admission medications   Medication Sig Start Date End Date Taking?  Authorizing Provider  aspirin 81 MG chewable tablet Chew 1 tablet (81 mg total) by mouth daily. 04/16/20   Hosie Poisson, MD  atorvastatin (LIPITOR) 80 MG tablet Take 80 mg by mouth daily. 04/15/20   [provider]  carvedilol (COREG) 3.125 MG tablet Take 1 tablet (3.125 mg  total) by mouth 2 (two) times daily with a meal. 04/15/20   Hosie Poisson, MD  isosorbide mononitrate (IMDUR) 30 MG 24 hr tablet Take 1 tablet (30 mg total) by mouth daily. 04/15/20   Hosie Poisson, MD  losartan (COZAAR) 50 MG tablet Take 1 tablet (50 mg total) by mouth daily. 04/16/20   Hosie Poisson, MD  TRADJENTA 5 MG TABS tablet Take 5 mg by mouth daily.  07/28/16   [provider]    Review of Systems:  Constitutional:  No weight loss, night sweats, Fevers, chills, fatigue.  Head&Eyes: No headache.  No vision loss.  No eye pain or scotoma ENT:  No Difficulty swallowing,Tooth/dental problems,Sore throat,  No ear ache, post nasal drip,  Cardio-vascular:  No chest pain, Orthopnea, PND, swelling in lower extremities,  dizziness, palpitations  GI:  No  abdominal pain, nausea, vomiting, diarrhea, loss of appetite, hematochezia, melena, heartburn, indigestion, Resp:  No cough. No coughing up of blood .No wheezing.No chest wall deformity  Skin:  no rash or lesions.  GU:  no dysuria, change in color of urine, no urgency or frequency. No flank pain.  Musculoskeletal:  No joint pain or swelling. No decreased range of motion. No back pain.  Psych:  No change in mood or affect. No depression or anxiety. Neurologic: No headache, no dysesthesia, no focal weakness, no vision loss. No syncope  Physical Exam: Vitals:   06/09/20 0300 06/09/20 0400 06/09/20 0531 06/09/20 0701  BP: (!) 184/104 (!) 199/110 (!) 214/98 (!) 220/114  Pulse: 97 92 97 86  Resp: 18 19 20 16   Temp:      TempSrc:      SpO2: 98% 100% 100% 95%  Weight:      Height:       General:  A&O x 3, NAD, nontoxic, pleasant/cooperative Head/Eye: No conjunctival hemorrhage, no icterus, [place]/AT, No nystagmus ENT:  No icterus,  No thrush, good dentition, no pharyngeal exudate Neck:  No masses, no lymphadenpathy, no bruits CV:  RRR, no rub, no gallop, no S3 Lung:  Bibasilar crackles. No wheeze Abdomen: soft/NT, +BS,  nondistended, no peritoneal signs Ext: No cyanosis, No rashes, No petechiae, No lymphangitis, No edema Neuro: CNII-XII intact, strength 4/5 in bilateral upper and lower extremities, no dysmetria  Labs on Admission:  Basic Metabolic Panel: Recent Labs  Lab 06/09/20 0358  NA 137  K 3.9  CL 102  CO2 25  GLUCOSE 122*  BUN 16  CREATININE 0.74  CALCIUM 8.8*   Liver Function Tests: No results for input(s): AST, ALT, ALKPHOS, BILITOT, PROT, ALBUMIN in the last 168 hours. No results for input(s): LIPASE, AMYLASE in the last 168 hours. No results for input(s): AMMONIA in the last 168 hours. CBC: Recent Labs  Lab 06/09/20 0358  WBC 12.4*  NEUTROABS 10.7*  HGB 13.6  HCT 42.9  MCV 91.9  PLT 271   Coagulation Profile: No results for input(s): INR, PROTIME in the last 168 hours. Cardiac Enzymes: No results for input(s): CKTOTAL, CKMB, CKMBINDEX, TROPONINI in the last 168 hours. BNP: Invalid input(s): POCBNP CBG: No results for input(s): GLUCAP in the last 168 hours. Urine analysis:    Component Value Date/Time  COLORURINE STRAW (A) 04/13/2020 1113   APPEARANCEUR CLEAR 04/13/2020 1113   APPEARANCEUR Cloudy (A) 09/25/2015 1230   LABSPEC 1.012 04/13/2020 1113   PHURINE 7.0 04/13/2020 1113   GLUCOSEU NEGATIVE 04/13/2020 1113   HGBUR MODERATE (A) 04/13/2020 1113   BILIRUBINUR NEGATIVE 04/13/2020 1113   BILIRUBINUR Negative 09/25/2015 [place] 04/13/2020 1113   PROTEINUR 30 (A) 04/13/2020 1113   NITRITE NEGATIVE 04/13/2020 1113   LEUKOCYTESUR NEGATIVE 04/13/2020 1113   Sepsis Labs: @LABRCNTIP (procalcitonin:4,lacticidven:4) )No results found for this or any previous visit (from the past 240 hour(s)).   Radiological Exams on Admission: DG Chest Port 1 View  Result Date: 06/09/2020 CLINICAL DATA:  59 year old female with shortness of breath since yesterday. Former smoker. EXAM: PORTABLE CHEST 1 VIEW COMPARISON:  Portable chest 04/13/2020 and earlier.  FINDINGS: Portable AP upright view at 0345 hours. Lung volumes and mediastinal contours are within normal limits. Allowing for portable technique the lungs are clear. No pneumothorax or pleural effusion. Paucity of bowel gas in the upper abdomen. Cholecystectomy clips. No acute osseous abnormality identified. IMPRESSION: No acute cardiopulmonary abnormality. Electronically Signed   By: Genevie Ann M.D.   On: 06/09/2020 03:58    EKG: Independently reviewed. Sinus, LBBB    Time spent:60 minutes Code Status:   FULL Family Communication:  No Family at bedside Disposition Plan: expect 1-2 day hospitalization Consults called: cardiology DVT Prophylaxis: [place] Lovenox  Orson Eva, DO  [facility] Pager [phone]  If 7PM-7AM, please contact night-coverage [email or web address] Password [facility] 06/09/2020, 7:48 AM

## 2020-06-09 NOTE — TOC Initial Note (Signed)
Transition of Care [facility]) - Initial/Assessment Note   Patient Details  Name: Michelle Morrison MRN: [id number] Date of Birth: [birth year]  Transition of Care [facility]) CM/SW Contact:    Sherie Don, LCSW Phone Number: 06/09/2020, 10:05 AM  Clinical Narrative: Patient is a 59 year old female who was admitted for acute on chronic combined systolic and diastolic CHF. TOC received consult for CHF screening. CSW met with patient in ED to complete assessment. Per patient, she lives in an apartment with her daughter. Patient reported she does not have any DME in the home nor does she have a history of [facility] services. Patient reported she is independent with her ADLs and is able to afford her medications, which she takes at prescribed. Per CHF screening, patient reported she does not follow a heart healthy diet, but she consumes no salt. Patient stated she does restrict her fluid intake and weighs herself daily to monitor fluid retention. TOC to follow for possible needs.  Expected Discharge Plan: Home/Self Care Barriers to Discharge: Continued Medical Work up  Patient Goals and CMS Choice Patient states their goals for this hospitalization and ongoing recovery are:: Discharge home CMS Medicare.gov Compare Post Acute Care list provided to:: Patient Choice offered to / list presented to : Patient  Expected Discharge Plan and Services Expected Discharge Plan: Home/Self Care In-house Referral: Clinical Social Work Living arrangements for the past 2 months: Apartment  Prior Living Arrangements/Services Living arrangements for the past 2 months: Apartment Lives with:: Adult Children Patient language and need for interpreter reviewed:: Yes Do you feel safe going back to the place where you live?: Yes      Need for Family Participation in Patient Care: No (Comment) Care giver support system in place?: Yes (comment) (Nellie Jenne (mother)) Criminal Activity/Legal Involvement Pertinent to Current  Situation/Hospitalization: No - Comment as needed  Emotional Assessment Appearance:: Appears stated age Attitude/Demeanor/Rapport: Engaged Affect (typically observed): Accepting Orientation: : Oriented to Self, Oriented to Place, Oriented to  Time, Oriented to Situation Alcohol / Substance Use: Not Applicable Psych Involvement: No (comment)  Admission diagnosis:  Acute on chronic combined systolic and diastolic CHF (congestive heart failure) ([place]) [I50.43] Patient Active Problem List   Diagnosis Date Noted  . Acute on chronic combined systolic and diastolic CHF (congestive heart failure) ([place]) 06/09/2020  . Hypertensive urgency   . Elevated troponin   . Dyspnea due to congestive heart failure ([place]) 04/13/2020  . Accelerated hypertension 04/13/2020  . Hyperglycemia due to diabetes mellitus ([place]) 04/13/2020  . 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 Herpes simplex infection 11/21/2015  . HTN (hypertension), benign 04/01/2013   PCP:  Rosita Fire, MD Pharmacy:   [place] [address] [place] [address] Phone: [phone] Fax: [phone]  Readmission Risk Interventions No flowsheet data found.

## 2020-06-09 NOTE — ED Triage Notes (Signed)
Pt c/o sob since yesterday. Pt is winded in triage answering questions.

## 2020-06-09 NOTE — ED Notes (Addendum)
Report given. COVID pending. Per receiving unit, pt can go up once covid results. [facility] ED Charge aware.

## 2020-06-10 DIAGNOSIS — I160 Hypertensive urgency: Secondary | ICD-10-CM | POA: Diagnosis not present

## 2020-06-10 DIAGNOSIS — I110 Hypertensive heart disease with heart failure: Secondary | ICD-10-CM | POA: Diagnosis not present

## 2020-06-10 DIAGNOSIS — I5043 Acute on chronic combined systolic (congestive) and diastolic (congestive) heart failure: Secondary | ICD-10-CM | POA: Diagnosis not present

## 2020-06-10 DIAGNOSIS — R778 Other specified abnormalities of plasma proteins: Secondary | ICD-10-CM | POA: Diagnosis not present

## 2020-06-10 LAB — BASIC METABOLIC PANEL
Anion gap: 11 (ref 5–15)
BUN: 19 mg/dL (ref 6–20)
CO2: 27 mmol/L (ref 22–32)
Calcium: 8.5 mg/dL — ABNORMAL LOW (ref 8.9–10.3)
Chloride: 98 mmol/L (ref 98–111)
Creatinine, Ser: 0.91 mg/dL (ref 0.44–1.00)
GFR calc Af Amer: >60 mL/min (ref >60)
GFR calc non Af Amer: >60 mL/min (ref >60)
Glucose, Bld: 122 mg/dL — ABNORMAL HIGH (ref 70–99)
Potassium: 3.6 mmol/L (ref 3.5–5.1)
Sodium: 136 mmol/L (ref 135–145)

## 2020-06-10 LAB — GLUCOSE, CAPILLARY
Glucose-Capillary: 137 mg/dL — ABNORMAL HIGH (ref 70–99)
Glucose-Capillary: 108 mg/dL — ABNORMAL HIGH (ref 70–99)

## 2020-06-10 MED ORDER — LOSARTAN POTASSIUM 100 MG PO TABS: 100.0000 mg | ORAL_TABLET | Freq: Every day | ORAL | 3 refills | Status: DC

## 2020-06-10 MED ORDER — LOSARTAN POTASSIUM 50 MG PO TABS: 100.0000 mg | ORAL_TABLET | Freq: Every day | ORAL | Status: DC

## 2020-06-10 MED ORDER — FUROSEMIDE 40 MG PO TABS: 40.0000 mg | ORAL_TABLET | Freq: Every day | ORAL | Status: DC

## 2020-06-10 MED ORDER — LOSARTAN POTASSIUM 50 MG PO TABS
50.0000 mg | ORAL_TABLET | Freq: Once | ORAL | Status: AC
  Administered 2020-06-10: 50 mg via ORAL

## 2020-06-10 MED ORDER — FUROSEMIDE 40 MG PO TABS: 40.0000 mg | ORAL_TABLET | Freq: Every day | ORAL | 3 refills | Status: DC

## 2020-06-10 MED ORDER — CARVEDILOL 6.25 MG PO TABS: 6.2500 mg | ORAL_TABLET | Freq: Two times a day (BID) | ORAL | 3 refills | Status: DC

## 2020-06-10 NOTE — Care Management Obs Status (Deleted)
[facility] NOTIFICATION   Patient Details  Name: ILIANA HUTT MRN: [id number] Date of Birth: 10/28/1961   Medicare Observation Status Notification Given:  Yes    Natasha Bence, LCSW 06/10/2020, 12:19 PM

## 2020-06-10 NOTE — Care Management CC44 (Signed)
Condition Code 44 Documentation Completed  Patient Details  Name: PHILANA YOUNIS MRN: [id number] Date of Birth: 05-Aug-1961   Condition Code 44 given:  Yes Patient signature on Condition Code 44 notice:  Yes (verbal signature) Documentation of 2 MD's agreement:  Yes Code 44 added to claim:  Yes    Natasha Bence, LCSW 06/10/2020, 1:39 PM

## 2020-06-10 NOTE — Care Management Obs Status (Signed)
[facility] NOTIFICATION   Patient Details  Name: Michelle Morrison MRN: [id number] Date of Birth: [birth year]   Medicare Observation Status Notification Given:  Yes    Natasha Bence, LCSW 06/10/2020, 12:52 PM

## 2020-06-10 NOTE — Care Management Obs Status (Deleted)
[place] NOTIFICATION   Patient Details  Name: Michelle Morrison MRN: [id number] Date of Birth: July 24, 1961   Medicare Observation Status Notification Given:  Yes    Natasha Bence, LCSW 06/10/2020, 12:15 PM

## 2020-06-10 NOTE — Care Management CC44 (Deleted)
Condition Code 44 Documentation Completed  Patient Details  Name: Michelle Morrison MRN: [id number] Date of Birth: 1961-07-04   Condition Code 44 given:  Yes Patient signature on Condition Code 44 notice:  Yes (verbal signature) Documentation of 2 MD's agreement:  Yes Code 44 added to claim:  Yes    Natasha Bence, LCSW 06/10/2020, 12:16 PM

## 2020-06-10 NOTE — Discharge Summary (Signed)
Physician Discharge Summary   Patient ID: GIANNI FUCHS MRN: [id number] DOB/AGE: [birth year] 58 y.o.  Admit date: 06/09/2020 Discharge date: 06/10/2020  Primary Care Physician:  Rosita Fire, MD   Recommendations for Outpatient Follow-up:  1. Follow up with PCP in 1-2 weeks 2. Patient will need bmet in 1 week 3. Started on Lasix 40 mg daily, Coreg increased to 6.25 mg twice daily 4. Started on Cozaar 100 mg daily  Home Health: Ambulating without any difficulty Equipment/Devices:   Discharge Condition: stable  CODE STATUS: FULL   Diet recommendation: Low-sodium heart healthy diet   Discharge Diagnoses:    . Acute on chronic combined systolic and diastolic CHF (congestive heart failure) (HCC) Leukocytosis Hypertensive urgency Diabetes mellitus type 2, NIDDM SVT/atrial tachycardia   Consults: Cardiology    Allergies:  No Known Allergies   DISCHARGE MEDICATIONS: Allergies as of 06/10/2020   No Known Allergies     Medication List    STOP taking these medications   isosorbide mononitrate 30 MG 24 hr tablet Commonly known as: IMDUR     TAKE these medications   albuterol 108 (90 Base) MCG/ACT inhaler Commonly known as: VENTOLIN HFA Inhale 1-2 puffs into the lungs every 4 (four) hours as needed.   aspirin 81 MG chewable tablet Chew 1 tablet (81 mg total) by mouth daily.   atorvastatin 80 MG tablet Commonly known as: LIPITOR Take 80 mg by mouth daily.   carvedilol 6.25 MG tablet Commonly known as: COREG Take 1 tablet (6.25 mg total) by mouth 2 (two) times daily with a meal. What changed:   medication strength  how much to take   furosemide 40 MG tablet Commonly known as: LASIX Take 1 tablet (40 mg total) by mouth daily. Start taking on: June 11, 2020   losartan 100 MG tablet Commonly known as: COZAAR Take 1 tablet (100 mg total) by mouth daily. Start taking on: June 11, 2020 What changed:   medication strength  how much to take    Tradjenta 5 MG Tabs tablet Generic drug: linagliptin Take 5 mg by mouth daily.        Brief H and P: For complete details please refer to admission H and P, but in brief *PHILOMENA BUTTERMORE is a 59 y.o. female with medical history of systolic and diastolic CHF/NICM, diabetes mellitus type 2, hypertension, SVT presenting with 1 day history of shortness of breath that began on 06/08/2020.  Patient denied any headache, fevers, chills, cough, hemoptysis, chest pain nausea, vomiting, diarrhea.  She endorses compliance with all her medications.  She states that she has been weighing herself and states that her weight has been fluctuating between 170-172 pounds.  She denies any worsening lower extremity edema but does state that her clothes have been fitting tighter.  She denies any orthopnea or PND.  Patient had recent hospital admission from 04/13/2020 to 2/83/6629 for acute systolic and diastolic CHF.  She underwent cardiac catheterization which showed normal coronaries.  The patient was discharged home with carvedilol, losartan, Imdur, and aspirin.  Her discharge weight was 70.1 kg. In the emergency department, the patient was afebrile hemodynamically stable with blood pressure up to 222/115.  The patient was given labetalol.  Oxygen saturation was 98-100% on room air.  BMP was unremarkable.  WBC was 12.4 with hemoglobin 13.6, platelets 271,000.  Troponin was 34>>> 46.  EKG shows sinus rhythm with left bundle branch block.  Chest x-ray showed increased vascular markings.  The patient was given furosemide  40 mg IV x1 with improvement of her shortness of breath.   Hospital Course:     Acute on chronic combined systolic and diastolic CHF (congestive heart failure) ([place]) -Likely precipitated by uncontrolled hypertension.  Patient was placed on IV Lasix for diuresis -2D echo 04/13/2020 had shown EF of 45%, grade 2 DD -Currently no dyspnea, ambulating in the room without any difficulty, discussed with  cardiology per patient's request. -Per Dr. Harrington Challenger, okay to discharge home with Lasix 40 mg daily, Coreg 6.25 mg twice daily, losartan 100 mg daily.  DC'd Imdur.  Patient will need to follow with cardiology in 7 to 10 days for further adjustments of her medications.  Leukocytosis Afebrile, hemodynamically stable, likely stress demargination No symptoms or signs of infection.  Hypertensive urgency, elevated troponin Continue Coreg, increase dose, losartan increased to 100 mg daily Cardiology was consulted, elevated troponin likely due to diastolic dysfunction in setting of severe hypertension Patient had cardiac cath which was normal in 03/2020  Atrial tachycardia -Continue Coreg, no acute issues  Diabetes mellitus type 2 Hemoglobin A1c 7.3 patient was placed on sliding scale insulin while inpatient   Day of Discharge S: No acute complaints, shortness of breath is improved, ambulating in the room without any difficulty, wants to go home  BP (!) 154/79   Pulse (!) 49   Temp 97.8 F (36.6 C) (Oral)   Resp 16   Ht 5\' 1"  (1.549 m)   Wt 78.1 kg   LMP 05/23/2007   SpO2 97%   BMI 32.53 kg/m   Physical Exam: General: Alert and awake oriented x3 not in any acute distress. HEENT: anicteric sclera, pupils reactive to light and accommodation CVS: S1-S2 clear no murmur rubs or gallops Chest: clear to auscultation bilaterally, no wheezing rales or rhonchi Abdomen: soft nontender, nondistended, normal bowel sounds Extremities: no cyanosis, clubbing or edema noted bilaterally Neuro: Cranial nerves II-XII intact, no focal neurological deficits    Get Medicines reviewed and adjusted: Please take all your medications with you for your next visit with your Primary MD  Please request your Primary MD to go over all hospital tests and procedure/radiological results at the follow up. Please ask your Primary MD to get all Hospital records sent to his/her office.  If you experience worsening of  your admission symptoms, develop shortness of breath, life threatening emergency, suicidal or homicidal thoughts you must seek medical attention immediately by calling 911 or calling your MD immediately  if symptoms less severe.  You must read complete instructions/literature along with all the possible adverse reactions/side effects for all the Medicines you take and that have been prescribed to you. Take any new Medicines after you have completely understood and accept all the possible adverse reactions/side effects.   Do not drive when taking pain medications.   Do not take more than prescribed Pain, Sleep and Anxiety Medications  Special Instructions: If you have smoked or chewed Tobacco  in the last 2 yrs please stop smoking, stop any regular Alcohol  and or any Recreational drug use.  Wear Seat belts while driving.  Please note  You were cared for by a hospitalist during your hospital stay. Once you are discharged, your primary care physician will handle any further medical issues. Please note that NO REFILLS for any discharge medications will be authorized once you are discharged, as it is imperative that you return to your primary care physician (or establish a relationship with a primary care physician if you do not have  one) for your aftercare needs so that they can reassess your need for medications and monitor your lab values.   The results of significant diagnostics from this hospitalization (including imaging, microbiology, ancillary and laboratory) are listed below for reference.      Procedures/Studies:  CARDIAC EVENT MONITOR  Result Date: 06/06/2020  30 day event monitor  Min HR 38, Max HR 163, Avg HR 72. Min HR occurred in early AM hours presumably while sleeping  Telemetry tracings show sinus rhythm and sinus bradycardia,several runs of atach with aberrancy up to 30 seconds.. Runs of atach followed by postconversion pauses up to 1.4 seconds.  5 beat run of NSVT  No  symptoms reported    DG Chest Port 1 View  Result Date: 06/09/2020 CLINICAL DATA:  59 year old female with shortness of breath since yesterday. Former smoker. EXAM: PORTABLE CHEST 1 VIEW COMPARISON:  Portable chest 04/13/2020 and earlier. FINDINGS: Portable AP upright view at 0345 hours. Lung volumes and mediastinal contours are within normal limits. Allowing for portable technique the lungs are clear. No pneumothorax or pleural effusion. Paucity of bowel gas in the upper abdomen. Cholecystectomy clips. No acute osseous abnormality identified. IMPRESSION: No acute cardiopulmonary abnormality. Electronically Signed   By: Genevie Ann M.D.   On: 06/09/2020 03:58       LAB RESULTS: Basic Metabolic Panel: Recent Labs  Lab 06/09/20 0358 06/10/20 0640  NA 137 136  K 3.9 3.6  CL 102 98  CO2 25 27  GLUCOSE 122* 122*  BUN 16 19  CREATININE 0.74 0.91  CALCIUM 8.8* 8.5*   Liver Function Tests: No results for input(s): AST, ALT, ALKPHOS, BILITOT, PROT, ALBUMIN in the last 168 hours. No results for input(s): LIPASE, AMYLASE in the last 168 hours. No results for input(s): AMMONIA in the last 168 hours. CBC: Recent Labs  Lab 06/09/20 0358  WBC 12.4*  NEUTROABS 10.7*  HGB 13.6  HCT 42.9  MCV 91.9  PLT 271   Cardiac Enzymes: No results for input(s): CKTOTAL, CKMB, CKMBINDEX, TROPONINI in the last 168 hours. BNP: Invalid input(s): POCBNP CBG: Recent Labs  Lab 06/10/20 0717 06/10/20 1113  GLUCAP 137* 108*       Disposition and Follow-up: Discharge Instructions    (HEART FAILURE PATIENTS) Call MD:  Anytime you have any of the following symptoms: 1) 3 pound weight gain in 24 hours or 5 pounds in 1 week 2) shortness of breath, with or without a dry hacking cough 3) swelling in the hands, feet or stomach 4) if you have to sleep on extra pillows at night in order to breathe.   Complete by: As directed    Diet - low sodium heart healthy   Complete by: As directed    Increase activity  slowly   Complete by: As directed        DISPOSITION: Home  DISCHARGE FOLLOW-UP  Follow-up Information    Rosita Fire, MD. Schedule an appointment as soon as possible for a visit in 2 week(s).   Specialty: Internal Medicine Contact information: [address] [place] [address] [phone]        Arnoldo Lenis, MD. Schedule an appointment as soon as possible for a visit in 1 week(s).   Specialty: Cardiology Contact information: [address] [place] [address] [phone]                Time coordinating discharge:  35 minutes  Signed:   Estill Cotta M.D. [facility] 06/10/2020, 12:26  PM        

## 2020-06-11 LAB — URINE CULTURE

## 2020-06-20 DIAGNOSIS — I5042 Chronic combined systolic (congestive) and diastolic (congestive) heart failure: Secondary | ICD-10-CM | POA: Diagnosis not present

## 2020-06-20 DIAGNOSIS — I10 Essential (primary) hypertension: Secondary | ICD-10-CM | POA: Diagnosis not present

## 2020-06-20 DIAGNOSIS — E1165 Type 2 diabetes mellitus with hyperglycemia: Secondary | ICD-10-CM | POA: Diagnosis not present

## 2020-06-21 DIAGNOSIS — I10 Essential (primary) hypertension: Secondary | ICD-10-CM | POA: Diagnosis not present

## 2020-06-21 DIAGNOSIS — E1165 Type 2 diabetes mellitus with hyperglycemia: Secondary | ICD-10-CM | POA: Diagnosis not present

## 2020-07-04 DIAGNOSIS — E1165 Type 2 diabetes mellitus with hyperglycemia: Secondary | ICD-10-CM | POA: Diagnosis not present

## 2020-07-04 DIAGNOSIS — E785 Hyperlipidemia, unspecified: Secondary | ICD-10-CM | POA: Diagnosis not present

## 2020-07-04 DIAGNOSIS — I10 Essential (primary) hypertension: Secondary | ICD-10-CM | POA: Diagnosis not present

## 2020-07-04 DIAGNOSIS — I5042 Chronic combined systolic (congestive) and diastolic (congestive) heart failure: Secondary | ICD-10-CM | POA: Diagnosis not present

## 2020-08-04 DIAGNOSIS — E1165 Type 2 diabetes mellitus with hyperglycemia: Secondary | ICD-10-CM | POA: Diagnosis not present

## 2020-08-04 DIAGNOSIS — I10 Essential (primary) hypertension: Secondary | ICD-10-CM | POA: Diagnosis not present

## 2020-08-07 ENCOUNTER — Encounter: Payer: Self-pay | Admitting: Cardiology

## 2020-08-07 ENCOUNTER — Other Ambulatory Visit: Payer: Self-pay

## 2020-08-07 ENCOUNTER — Ambulatory Visit (INDEPENDENT_AMBULATORY_CARE_PROVIDER_SITE_OTHER): Payer: Medicare Other | Admitting: Cardiology

## 2020-08-07 VITALS — BP 190/98 | HR 84 | Ht 65.0 in | Wt 172.0 lb

## 2020-08-07 DIAGNOSIS — I10 Essential (primary) hypertension: Secondary | ICD-10-CM | POA: Diagnosis not present

## 2020-08-07 DIAGNOSIS — I5042 Chronic combined systolic (congestive) and diastolic (congestive) heart failure: Secondary | ICD-10-CM

## 2020-08-07 MED ORDER — LOSARTAN POTASSIUM 100 MG PO TABS
100.0000 mg | ORAL_TABLET | Freq: Every day | ORAL | 3 refills | Status: DC
Start: 2020-08-07 — End: 2021-02-20

## 2020-08-07 NOTE — Patient Instructions (Signed)
Medication Instructions:  INCREASE Losartan to 100 mg daily   Take your blood pressure daily and call us Thursday, Sept 16 th with readings   *If you need a refill on your cardiac medications before your next appointment, please call your pharmacy*   Lab Work: None today If you have labs (blood work) drawn today and your tests are completely normal, you will receive your results only by: Marland Kitchen MyChart Message (if you have MyChart) OR . A paper copy in the mail If you have any lab test that is abnormal or we need to change your treatment, we will call you to review the results.   Testing/Procedures: None today   Follow-Up: At [facility], you and your health needs are our priority.  As part of our continuing mission to provide you with exceptional heart care, we have created designated Provider Care Teams.  These Care Teams include your primary Cardiologist (physician) and Advanced Practice Providers (APPs -  Physician Assistants and Nurse Practitioners) who all work together to provide you with the care you need, when you need it.  We recommend signing up for the patient portal called "MyChart".  Sign up information is provided on this After Visit Summary.  MyChart is used to connect with patients for Virtual Visits (Telemedicine).  Patients are able to view lab/test results, encounter notes, upcoming appointments, etc.  Non-urgent messages can be sent to your provider as well.   To learn more about what you can do with MyChart, go to [email or web address].    Your next appointment:   2 month(s)  The format for your next appointment:   In Person  Provider:   Bernerd Pho, PA-C or Ermalinda Barrios, PA-C   Other Instructions None       Thank you for choosing [facility] !

## 2020-08-07 NOTE — Progress Notes (Signed)
Clinical Summary Ms. Dittrich is a 59 y.o.female seen today for follow up of the following medical problem.s   1. Chronic systolic/diastolic HF - admit 12/9935 with pulmonary edema - EKG with LBBB, had WMA on echo referred for cath - 03/2020 cath: normal coronaries, LVEDP 10 - 03/2020 echo: LVEF 45%, anteroseptal wall hypokinesis, grade II diastolic dysfunction  - recurrent admission 05/2020 with HTN, volume overload - no recent edema. No SOB or DOE.   2. HTN - recurrent issues. Significant HTN during 03/2020 and 05/2020 admissions - SBP at home 150 -she reports losartan was lowered to 50mg  daily by pcp due to stomach upset     3. Hyperlipidemia - compliant with meds.    4. PSVT - had some runs during prior hospital admission  - 04/2020 monitor Min HR 38, Max HR 163, Avg HR 72. Min HR occurred in early AM hours presumably while sleeping  Telemetry tracings show sinus rhythm and sinus bradycardia,several runs of atach with aberrancy up to 30 seconds.. Runs of atach followed by postconversion pauses up to 1.4 seconds.  5 beat run of NSVT  No symptoms reported  - no recent palpitations.    Past Medical History:  Diagnosis Date  . CHF (congestive heart failure) ([place])    a. EF 45% by echo in 03/2020 with normal cors by cath  . Diabetes mellitus without complication ([place])   . Diarrhea   . Herpes simplex infection   . Hyperlipidemia   . Hypertension      No Known Allergies   Current Outpatient Medications  Medication Sig Dispense Refill  . albuterol (VENTOLIN HFA) 108 (90 Base) MCG/ACT inhaler Inhale 1-2 puffs into the lungs every 4 (four) hours as needed.    Marland Kitchen aspirin 81 MG chewable tablet Chew 1 tablet (81 mg total) by mouth daily. 30 tablet 1  . atorvastatin (LIPITOR) 80 MG tablet Take 80 mg by mouth daily.    . carvedilol (COREG) 6.25 MG tablet Take 1 tablet (6.25 mg total) by mouth 2 (two) times daily with a meal. 60 tablet 3  . furosemide (LASIX) 40 MG tablet  Take 1 tablet (40 mg total) by mouth daily. 30 tablet 3  . losartan (COZAAR) 100 MG tablet Take 1 tablet (100 mg total) by mouth daily. 30 tablet 3  . TRADJENTA 5 MG TABS tablet Take 5 mg by mouth daily.      No current facility-administered medications for this visit.     Past Surgical History:  Procedure Laterality Date  . CESAREAN SECTION    . CHOLECYSTECTOMY    . COLONOSCOPY N/A 08/26/2014   Procedure: COLONOSCOPY;  Surgeon: [name], MD;  Location[facility];  Service: Endoscopy;  Laterality: N/A;  9:30 AM - moved to 8:30 - Ginger to notify pt  . LEFT HEART CATH AND CORONARY ANGIOGRAPHY N/A 04/14/2020   Procedure: LEFT HEART CATH AND CORONARY ANGIOGRAPHY;  Surgeon: [name], [name], MD;  Location: [facility];  Service: Cardiovascular;  Laterality: N/A;     No Known Allergies    Family History  Problem Relation Age of Onset  . Hypertension Mother   . Diabetes Mother   . Colon cancer Neg Hx      Social History Ms. Javier reports that she has quit smoking. Her smoking use included cigarettes. She has never used smokeless tobacco. Ms. Woodring reports no history of alcohol use.   Review of Systems CONSTITUTIONAL: No weight loss, fever, chills,  weakness or fatigue.  HEENT: Eyes: No visual loss, blurred vision, double vision or yellow sclerae.No hearing loss, sneezing, congestion, runny nose or sore throat.  SKIN: No rash or itching.  CARDIOVASCULAR: per hpi RESPIRATORY: No shortness of breath, cough or sputum.  GASTROINTESTINAL: No anorexia, nausea, vomiting or diarrhea. No abdominal pain or blood.  GENITOURINARY: No burning on urination, no polyuria NEUROLOGICAL: No headache, dizziness, syncope, paralysis, ataxia, numbness or tingling in the extremities. No change in bowel or bladder control.  MUSCULOSKELETAL: No muscle, back pain, joint pain or stiffness.  LYMPHATICS: No enlarged nodes. No history of splenectomy.  PSYCHIATRIC: No history of depression or  anxiety.  ENDOCRINOLOGIC: No reports of sweating, cold or heat intolerance. No polyuria or polydipsia.  Marland Kitchen   Physical Examination Today's Vitals   08/07/20 0854  BP: (!) 190/98  Pulse: 84  SpO2: 98%  Weight: 172 lb (78 kg)  Height: 5\' 5"  (1.651 m)   Body mass index is 28.62 kg/m.  Gen: resting comfortably, no acute distress HEENT: no scleral icterus, pupils equal round and reactive, no palptable cervical adenopathy,  CV Resp: Clear to auscultation bilaterally GI: abdomen is soft, non-tender, non-distended, normal bowel sounds, no hepatosplenomegaly MSK: extremities are warm, no edema.  Skin: warm, no rash Neuro:  no focal deficits Psych: appropriate affect     Assessment and Plan  1. Chronic systolic/diastolic HF - euvolemic, weights stable and no symptoms - will increase her losartan back to 100mg  daily  2. HTN - bp elevated today, manual recheck 160/90 - increase losartan back to 100mg  daily, unclear this was the cause of stomach upset. If recurrent can consider alternative option but certaintly needs better bp control - call Thursday with home bp's       Arnoldo Lenis, M.D

## 2020-08-15 ENCOUNTER — Ambulatory Visit: Payer: Medicare Other | Admitting: Cardiology

## 2020-09-03 DIAGNOSIS — I5042 Chronic combined systolic (congestive) and diastolic (congestive) heart failure: Secondary | ICD-10-CM | POA: Diagnosis not present

## 2020-09-03 DIAGNOSIS — E1165 Type 2 diabetes mellitus with hyperglycemia: Secondary | ICD-10-CM | POA: Diagnosis not present

## 2020-09-06 DIAGNOSIS — Z23 Encounter for immunization: Secondary | ICD-10-CM | POA: Diagnosis not present

## 2020-09-14 ENCOUNTER — Other Ambulatory Visit (HOSPITAL_COMMUNITY): Payer: Self-pay | Admitting: Internal Medicine

## 2020-09-14 DIAGNOSIS — Z1231 Encounter for screening mammogram for malignant neoplasm of breast: Secondary | ICD-10-CM

## 2020-09-26 NOTE — Progress Notes (Signed)
Cardiology Office Note    Date:  10/03/2020   ID:  Michelle Morrison, DOB [birth year], MRN [id number]  PCP:  Rosita Fire, MD  Cardiologist: Carlyle Dolly, MD EPS: None  Chief Complaint  Patient presents with  . Follow-up  . Pre-op Exam    History of Present Illness:  Michelle Morrison is a 59 y.o. female with history of chronic combined systolic and diastolic CHF LVEF 08% with anterolateral wall hypokinesis and grade 2 DD on echo 03/2020, cardiac cath 04/14/2020 normal coronary anatomy, hypertension, HLD, PSVT on monitor 04/2020 see below.  Patient last saw Dr. Harl Bowie 08/07/2020 at which time her blood pressure was elevated and losartan increased to 100 mg daily.   Patient comes in for f/u. Feeling much better. BP normal at home. It was up yesterday at the dentist because she was having a tooth ache. Denies chest pain, dyspnea, dizziness or presyncope. Very active, walks dog about 1 mile daily, takes care of mother, cleans. Needs clearance for dental work and possible root canal.   Past Medical History:  Diagnosis Date  . CHF (congestive heart failure) ([place])    a. EF 45% by echo in 03/2020 with normal cors by cath  . Diabetes mellitus without complication ([place])   . Diarrhea   . Herpes simplex infection   . Hyperlipidemia   . Hypertension     Past Surgical History:  Procedure Laterality Date  . CESAREAN SECTION    . CHOLECYSTECTOMY    . COLONOSCOPY N/A 08/26/2014   Procedure: COLONOSCOPY;  Surgeon: [name], MD;  Location: [facility];  Service: Endoscopy;  Laterality: N/A;  9:30 AM - moved to 8:30 - Ginger to notify pt  . LEFT HEART CATH AND CORONARY ANGIOGRAPHY N/A 04/14/2020   Procedure: LEFT HEART CATH AND CORONARY ANGIOGRAPHY;  Surgeon: [name], [name], MD;  Location: [facility];  Service: Cardiovascular;  Laterality: N/A;    Current Medications: Current Meds  Medication Sig  . albuterol (VENTOLIN HFA) 108 (90 Base) MCG/ACT inhaler Inhale 1-2 puffs into  the lungs every 4 (four) hours as needed.  Marland Kitchen aspirin 81 MG chewable tablet Chew 1 tablet (81 mg total) by mouth daily.  Marland Kitchen atorvastatin (LIPITOR) 80 MG tablet Take 80 mg by mouth daily.  . carvedilol (COREG) 6.25 MG tablet Take 1 tablet (6.25 mg total) by mouth 2 (two) times daily with a meal.  . furosemide (LASIX) 40 MG tablet Take 1 tablet (40 mg total) by mouth daily.  Marland Kitchen losartan (COZAAR) 100 MG tablet Take 1 tablet (100 mg total) by mouth daily.  . TRADJENTA 5 MG TABS tablet Take 5 mg by mouth daily.      Allergies:   Patient has no known allergies.   Social History   Socioeconomic History  . Marital status: Single    Spouse name: Not on file  . Number of children: Not on file  . Years of education: Not on file  . Highest education level: Not on file  Occupational History  . Not on file  Tobacco Use  . Smoking status: Former Smoker    Types: Cigarettes  . Smokeless tobacco: Never Used  Vaping Use  . Vaping Use: Never used  Substance and Sexual Activity  . Alcohol use: No    Alcohol/week: 0.0 standard drinks  . Drug use: No  . Sexual activity: Not Currently    Birth control/protection: Post-menopausal  Other Topics Concern  . Not on file  Social  History Narrative  . Not on file   Social Determinants of Health   Financial Resource Strain:   . Difficulty of Paying Living Expenses: Not on file  Food Insecurity:   . Worried About Charity fundraiser in the Last Year: Not on file  . Ran Out of Food in the Last Year: Not on file  Transportation Needs: No Transportation Needs  . Lack of Transportation (Medical): No  . Lack of Transportation (Non-Medical): No  Physical Activity:   . Days of Exercise per Week: Not on file  . Minutes of Exercise per Session: Not on file  Stress:   . Feeling of Stress : Not on file  Social Connections:   . Frequency of Communication with Friends and Family: Not on file  . Frequency of Social Gatherings with Friends and Family: Not on  file  . Attends Religious Services: Not on file  . Active Member of Clubs or Organizations: Not on file  . Attends Archivist Meetings: Not on file  . Marital Status: Not on file     Family History:  The patient's family history includes Diabetes in her mother; Hypertension in her mother.   ROS:   Please see the history of present illness.    ROS All other systems reviewed and are negative.   PHYSICAL EXAM:   VS:  BP 138/80   Pulse 62   Ht 5\' 3"  (1.6 m)   Wt 173 lb 12.8 oz (78.8 kg)   LMP 05/23/2007   SpO2 96%   BMI 30.79 kg/m   Physical Exam  GEN: Well nourished, well developed, in no acute distress  Neck: no JVD, carotid bruits, or masses Cardiac:RRR; 2/6 systolic murmur at the left sternal border Respiratory:  clear to auscultation bilaterally, normal work of breathing GI: soft, nontender, nondistended, + BS Ext: without cyanosis, clubbing, or edema, Good distal pulses bilaterally Neuro:  Alert and Oriented x 3 Psych: euthymic mood, full affect  Wt Readings from Last 3 Encounters:  10/03/20 173 lb 12.8 oz (78.8 kg)  08/07/20 172 lb (78 kg)  06/10/20 172 lb 2.9 oz (78.1 kg)      Studies/Labs Reviewed:   EKG:  EKG is not ordered today.   Recent Labs: 04/13/2020: ALT 42; Magnesium 2.0; TSH 1.277 06/09/2020: B Natriuretic Peptide 260.0; Hemoglobin 13.6; Platelets 271 06/10/2020: BUN 19; Creatinine, Ser 0.91; Potassium 3.6; Sodium 136   Lipid Panel    Component Value Date/Time   CHOL 172 04/14/2020 0903   TRIG 52 04/14/2020 0903   HDL 62 04/14/2020 0903   CHOLHDL 2.8 04/14/2020 0903   VLDL 10 04/14/2020 0903   LDLCALC 100 (H) 04/14/2020 0903    Additional studies/ records that were reviewed today include:   Holter monitor 05/24/2020  IMPRESSIONS     1. Right ventricular systolic function is normal. The right ventricular  size is normal.   2. The anteroseptal wall is severely hypokinetic to akinetic.More  pronounced finding that would be seen  with her LBBB alone. Left  ventricular ejection fraction, by estimation, is 45%. The left ventricle  has mildly decreased function. The left ventricle  demonstrates regional wall motion abnormalities (see scoring  diagram/findings for description). There is moderate left ventricular  hypertrophy. Left ventricular diastolic parameters are consistent with  Grade II diastolic dysfunction  (pseudonormalization).   3. Left atrial size was mildly dilated.   4. The mitral valve is normal in structure. No evidence of mitral valve  regurgitation. No evidence  of mitral stenosis.   5. The aortic valve is tricuspid. Aortic valve regurgitation is not  visualized. No aortic stenosis is present.   6. The inferior vena cava is normal in size with greater than 50%  respiratory variability, suggesting right atrial pressure of 3 mmHg.   FINDINGS    Echo 5/20/2021IMPRESSIONS     1. Right ventricular systolic function is normal. The right ventricular  size is normal.   2. The anteroseptal wall is severely hypokinetic to akinetic.More  pronounced finding that would be seen with her LBBB alone. Left  ventricular ejection fraction, by estimation, is 45%. The left ventricle  has mildly decreased function. The left ventricle  demonstrates regional wall motion abnormalities (see scoring  diagram/findings for description). There is moderate left ventricular  hypertrophy. Left ventricular diastolic parameters are consistent with  Grade II diastolic dysfunction  (pseudonormalization).   3. Left atrial size was mildly dilated.   4. The mitral valve is normal in structure. No evidence of mitral valve  regurgitation. No evidence of mitral stenosis.   5. The aortic valve is tricuspid. Aortic valve regurgitation is not  visualized. No aortic stenosis is present.   6. The inferior vena cava is normal in size with greater than 50%  respiratory variability, suggesting right atrial pressure of 3 mmHg.     Cardiac  cath 3/26/7124PY end diastolic pressure is normal.   1. Normal coronary anatomy.  2. Normal LVEDP 10 mm Hg   Plan: medical management with BP control. Anticipate DC today.        ASSESSMENT:    1. Chronic combined systolic and diastolic CHF (congestive heart failure) ([place])   2. Ischemic cardiomyopathy   3. Essential hypertension   4. Hyperlipidemia, unspecified hyperlipidemia type   5. PSVT (paroxysmal supraventricular tachycardia) ([facility])      PLAN:  In order of problems listed above:  Preoperative clearance before undergoing dental work with tooth pulling and possible root canal.  She is stable to proceed with dental work without any prior work-up.  Patient is very active and is asymptomatic from a cardiac standpoint. PSVT with post conversion pauses discussed with Dr. Domenic Polite. No problem using lidocaine for dental work.  NICM/chronic combined systolic and diastolic CHF LVEF 09% with anterolateral wall hypokinesis and grade 2 DD 03/2020 compensated without evidence of heart failure  Essential hypertension losartan increased to 100 mg daily for elevated blood pressures at office visit 08/07/2020.  Blood pressure controlled today.  Was up when she had some tooth pain but it is better today.  Lab work drawn by Dr. Legrand Rams recently.  Will not repeat.  HLD continue Lipitor managed by PCP  PSVT monitor 04/2020 several runs of atrial tachycardia with aberrancy up to 30 seconds followed by postconversion pauses up to 1.4 seconds and 5 beat run of NSVT.  Asymptomatic at follow-up 08/07/2020   Medication Adjustments/Labs and Tests Ordered: Current medicines are reviewed at length with the patient today.  Concerns regarding medicines are outlined above.  Medication changes, Labs and Tests ordered today are listed in the Patient Instructions below. Patient Instructions  Medication Instructions:  Your physician recommends that you continue on your current medications as directed. Please refer to  the Current Medication list given to you today.  *If you need a refill on your cardiac medications before your next appointment, please call your pharmacy*   Lab Work: NONE   If you have labs (blood work) drawn today and your tests are completely normal, you will  receive your results only by: Marland Kitchen MyChart Message (if you have MyChart) OR . A paper copy in the mail If you have any lab test that is abnormal or we need to change your treatment, we will call you to review the results.   Testing/Procedures: NONE    Follow-Up: At [facility], you and your health needs are our priority.  As part of our continuing mission to provide you with exceptional heart care, we have created designated Provider Care Teams.  These Care Teams include your primary Cardiologist (physician) and Advanced Practice Providers (APPs -  Physician Assistants and Nurse Practitioners) who all work together to provide you with the care you need, when you need it.  We recommend signing up for the patient portal called "MyChart".  Sign up information is provided on this After Visit Summary.  MyChart is used to connect with patients for Virtual Visits (Telemedicine).  Patients are able to view lab/test results, encounter notes, upcoming appointments, etc.  Non-urgent messages can be sent to your provider as well.   To learn more about what you can do with MyChart, go to [email or web address].    Your next appointment:   6 month(s)  The format for your next appointment:   In Person  Provider:   Carlyle Dolly, MD   Other Instructions Thank you for choosing [facility]!       Sumner Boast, PA-C  10/03/2020 12:07 PM    [facility] [address], [place], [place]  [address] Phone: [phone]; Fax: [phone]

## 2020-10-03 ENCOUNTER — Encounter: Payer: Self-pay | Admitting: Physician Assistant

## 2020-10-03 ENCOUNTER — Other Ambulatory Visit: Payer: Self-pay

## 2020-10-03 ENCOUNTER — Ambulatory Visit (INDEPENDENT_AMBULATORY_CARE_PROVIDER_SITE_OTHER): Payer: Medicare Other | Admitting: Physician Assistant

## 2020-10-03 VITALS — BP 138/80 | HR 62 | Ht 63.0 in | Wt 173.8 lb

## 2020-10-03 DIAGNOSIS — I255 Ischemic cardiomyopathy: Secondary | ICD-10-CM

## 2020-10-03 DIAGNOSIS — I5042 Chronic combined systolic (congestive) and diastolic (congestive) heart failure: Secondary | ICD-10-CM | POA: Diagnosis not present

## 2020-10-03 DIAGNOSIS — E785 Hyperlipidemia, unspecified: Secondary | ICD-10-CM | POA: Diagnosis not present

## 2020-10-03 DIAGNOSIS — I471 Supraventricular tachycardia: Secondary | ICD-10-CM

## 2020-10-03 DIAGNOSIS — I10 Essential (primary) hypertension: Secondary | ICD-10-CM | POA: Diagnosis not present

## 2020-10-03 NOTE — Patient Instructions (Signed)
Medication Instructions:  Your physician recommends that you continue on your current medications as directed. Please refer to the Current Medication list given to you today.  *If you need a refill on your cardiac medications before your next appointment, please call your pharmacy*   Lab Work: NONE   If you have labs (blood work) drawn today and your tests are completely normal, you will receive your results only by: . MyChart Message (if you have MyChart) OR . A paper copy in the mail If you have any lab test that is abnormal or we need to change your treatment, we will call you to review the results.   Testing/Procedures: NONE    Follow-Up: At [facility], you and your health needs are our priority.  As part of our continuing mission to provide you with exceptional heart care, we have created designated Provider Care Teams.  These Care Teams include your primary Cardiologist (physician) and Advanced Practice Providers (APPs -  Physician Assistants and Nurse Practitioners) who all work together to provide you with the care you need, when you need it.  We recommend signing up for the patient portal called "MyChart".  Sign up information is provided on this After Visit Summary.  MyChart is used to connect with patients for Virtual Visits (Telemedicine).  Patients are able to view lab/test results, encounter notes, upcoming appointments, etc.  Non-urgent messages can be sent to your provider as well.   To learn more about what you can do with MyChart, go to [email or web address].    Your next appointment:   6 month(s)  The format for your next appointment:   In Person  Provider:   Jonathan Branch, MD   Other Instructions Thank you for choosing [facility]!    

## 2020-10-04 DIAGNOSIS — E1165 Type 2 diabetes mellitus with hyperglycemia: Secondary | ICD-10-CM | POA: Diagnosis not present

## 2020-10-04 DIAGNOSIS — I10 Essential (primary) hypertension: Secondary | ICD-10-CM | POA: Diagnosis not present

## 2020-10-17 DIAGNOSIS — M2011 Hallux valgus (acquired), right foot: Secondary | ICD-10-CM | POA: Diagnosis not present

## 2020-10-17 DIAGNOSIS — M79671 Pain in right foot: Secondary | ICD-10-CM | POA: Diagnosis not present

## 2020-10-17 DIAGNOSIS — M10079 Idiopathic gout, unspecified ankle and foot: Secondary | ICD-10-CM | POA: Diagnosis not present

## 2020-11-03 DIAGNOSIS — I10 Essential (primary) hypertension: Secondary | ICD-10-CM | POA: Diagnosis not present

## 2020-11-03 DIAGNOSIS — E11319 Type 2 diabetes mellitus with unspecified diabetic retinopathy without macular edema: Secondary | ICD-10-CM | POA: Diagnosis not present

## 2020-11-03 DIAGNOSIS — E1165 Type 2 diabetes mellitus with hyperglycemia: Secondary | ICD-10-CM | POA: Diagnosis not present

## 2020-11-27 ENCOUNTER — Ambulatory Visit (HOSPITAL_COMMUNITY)
Admission: RE | Admit: 2020-11-27 | Discharge: 2020-11-27 | Disposition: A | Discharge status: Home / Self Care | Payer: Medicare Other | Source: Ambulatory Visit | Attending: Internal Medicine | Admitting: Internal Medicine

## 2020-11-27 ENCOUNTER — Other Ambulatory Visit: Payer: Self-pay

## 2020-11-27 DIAGNOSIS — Z1231 Encounter for screening mammogram for malignant neoplasm of breast: Secondary | ICD-10-CM | POA: Diagnosis not present

## 2020-11-30 ENCOUNTER — Other Ambulatory Visit: Payer: Medicare Other

## 2020-11-30 DIAGNOSIS — Z20822 Contact with and (suspected) exposure to covid-19: Secondary | ICD-10-CM | POA: Diagnosis not present

## 2020-12-01 LAB — SARS-COV-2, NAA 2 DAY TAT

## 2020-12-01 LAB — NOVEL CORONAVIRUS, NAA: SARS-CoV-2, NAA: NOT DETECTED

## 2020-12-04 DIAGNOSIS — E1165 Type 2 diabetes mellitus with hyperglycemia: Secondary | ICD-10-CM | POA: Diagnosis not present

## 2020-12-04 DIAGNOSIS — I5042 Chronic combined systolic (congestive) and diastolic (congestive) heart failure: Secondary | ICD-10-CM | POA: Diagnosis not present

## 2020-12-19 DIAGNOSIS — I5042 Chronic combined systolic (congestive) and diastolic (congestive) heart failure: Secondary | ICD-10-CM | POA: Diagnosis not present

## 2020-12-19 DIAGNOSIS — I10 Essential (primary) hypertension: Secondary | ICD-10-CM | POA: Diagnosis not present

## 2020-12-19 DIAGNOSIS — E1165 Type 2 diabetes mellitus with hyperglycemia: Secondary | ICD-10-CM | POA: Diagnosis not present

## 2021-01-04 ENCOUNTER — Other Ambulatory Visit (INDEPENDENT_AMBULATORY_CARE_PROVIDER_SITE_OTHER): Payer: Medicare Other | Admitting: *Deleted

## 2021-01-04 ENCOUNTER — Other Ambulatory Visit: Payer: Self-pay

## 2021-01-04 VITALS — BP 184/85 | HR 69

## 2021-01-04 DIAGNOSIS — R399 Unspecified symptoms and signs involving the genitourinary system: Secondary | ICD-10-CM | POA: Diagnosis not present

## 2021-01-04 LAB — POCT URINALYSIS DIPSTICK OB
Glucose, UA: NEGATIVE
Ketones, UA: NEGATIVE
Nitrite, UA: NEGATIVE
POC,PROTEIN,UA: NEGATIVE

## 2021-01-04 MED ORDER — SULFAMETHOXAZOLE-TRIMETHOPRIM 800-160 MG PO TABS
1.0000 | ORAL_TABLET | Freq: Two times a day (BID) | ORAL | 0 refills | Status: DC
Start: 2021-01-04 — End: 2021-02-20

## 2021-01-04 MED ORDER — PHENAZOPYRIDINE HCL 200 MG PO TABS
200.0000 mg | ORAL_TABLET | Freq: Three times a day (TID) | ORAL | 0 refills | Status: DC | PRN
Start: 2021-01-04 — End: 2021-02-20

## 2021-01-04 NOTE — Progress Notes (Addendum)
   NURSE VISIT- UTI SYMPTOMS   SUBJECTIVE:  Michelle Morrison is a 60 y.o. [id number] female here for UTI symptoms. She is a GYN patient. She reports lower abdominal pain and burning with urination.  OBJECTIVE:  BP (!) 184/85 (BP Location: Left Arm, Patient Position: Sitting, Cuff Size: Normal)   Pulse 69   LMP 05/23/2007   Appears well, in no apparent distress  Results for orders placed or performed in visit on 01/04/21 (from the past 24 hour(s))  POC Urinalysis Dipstick OB   Collection Time: 01/04/21  3:21 PM  Result Value Ref Range   Color, UA     Clarity, UA     Glucose, UA Negative Negative   Bilirubin, UA     Ketones, UA neg    Spec Grav, UA     Blood, UA mod    pH, UA     POC,PROTEIN,UA Negative Negative, Trace, Small (1+), Moderate (2+), Large (3+), 4+   Urobilinogen, UA     Nitrite, UA neg    Leukocytes, UA Large (3+) (A) Negative   Appearance     Odor      ASSESSMENT: GYN patient with UTI symptoms and negative nitrites  PLAN: Discussed with Nigel Berthold, CNM   Rx sent by provider today: Yes Urine culture sent Call or return to clinic prn if these symptoms worsen or fail to improve as anticipated. Follow-up: as needed   Levy Pupa  01/04/2021 3:37 PM    Has a home BP cuff, says BP was "good" this am.  Asked to bring cuff here to make sure it is accurate, will need to let PCP know about high BP.  Given rx for pyridum and septra d/t sx and + blood/leuks.

## 2021-01-07 LAB — URINE CULTURE

## 2021-01-11 DIAGNOSIS — E1136 Type 2 diabetes mellitus with diabetic cataract: Secondary | ICD-10-CM | POA: Diagnosis not present

## 2021-01-11 DIAGNOSIS — H2513 Age-related nuclear cataract, bilateral: Secondary | ICD-10-CM | POA: Diagnosis not present

## 2021-01-11 DIAGNOSIS — H35033 Hypertensive retinopathy, bilateral: Secondary | ICD-10-CM | POA: Diagnosis not present

## 2021-01-11 DIAGNOSIS — Z7984 Long term (current) use of oral hypoglycemic drugs: Secondary | ICD-10-CM | POA: Diagnosis not present

## 2021-01-11 DIAGNOSIS — E119 Type 2 diabetes mellitus without complications: Secondary | ICD-10-CM | POA: Diagnosis not present

## 2021-01-15 DIAGNOSIS — E1165 Type 2 diabetes mellitus with hyperglycemia: Secondary | ICD-10-CM | POA: Diagnosis not present

## 2021-01-15 DIAGNOSIS — I10 Essential (primary) hypertension: Secondary | ICD-10-CM | POA: Diagnosis not present

## 2021-01-15 DIAGNOSIS — Z1389 Encounter for screening for other disorder: Secondary | ICD-10-CM | POA: Diagnosis not present

## 2021-01-15 DIAGNOSIS — I5042 Chronic combined systolic (congestive) and diastolic (congestive) heart failure: Secondary | ICD-10-CM | POA: Diagnosis not present

## 2021-01-15 DIAGNOSIS — Z0001 Encounter for general adult medical examination with abnormal findings: Secondary | ICD-10-CM | POA: Diagnosis not present

## 2021-02-11 ENCOUNTER — Other Ambulatory Visit: Payer: Self-pay

## 2021-02-11 ENCOUNTER — Observation Stay (HOSPITAL_COMMUNITY): Payer: Medicare Other

## 2021-02-11 ENCOUNTER — Encounter (HOSPITAL_COMMUNITY): Payer: Self-pay

## 2021-02-11 ENCOUNTER — Emergency Department (HOSPITAL_COMMUNITY): Payer: Medicare Other

## 2021-02-11 ENCOUNTER — Inpatient Hospital Stay (HOSPITAL_COMMUNITY)
Admission: EM | Admit: 2021-02-11 | Discharge: 2021-02-20 | DRG: 286 | Disposition: A | Discharge status: Home / Self Care | Payer: Medicare Other | Attending: Cardiology | Admitting: Cardiology

## 2021-02-11 DIAGNOSIS — I480 Paroxysmal atrial fibrillation: Secondary | ICD-10-CM | POA: Diagnosis not present

## 2021-02-11 DIAGNOSIS — R110 Nausea: Secondary | ICD-10-CM | POA: Diagnosis not present

## 2021-02-11 DIAGNOSIS — I4892 Unspecified atrial flutter: Secondary | ICD-10-CM

## 2021-02-11 DIAGNOSIS — I4891 Unspecified atrial fibrillation: Secondary | ICD-10-CM | POA: Diagnosis not present

## 2021-02-11 DIAGNOSIS — Z452 Encounter for adjustment and management of vascular access device: Secondary | ICD-10-CM

## 2021-02-11 DIAGNOSIS — I5082 Biventricular heart failure: Secondary | ICD-10-CM | POA: Diagnosis not present

## 2021-02-11 DIAGNOSIS — I10 Essential (primary) hypertension: Secondary | ICD-10-CM | POA: Diagnosis not present

## 2021-02-11 DIAGNOSIS — I472 Ventricular tachycardia: Secondary | ICD-10-CM | POA: Diagnosis not present

## 2021-02-11 DIAGNOSIS — I495 Sick sinus syndrome: Secondary | ICD-10-CM | POA: Diagnosis present

## 2021-02-11 DIAGNOSIS — I071 Rheumatic tricuspid insufficiency: Secondary | ICD-10-CM | POA: Diagnosis present

## 2021-02-11 DIAGNOSIS — E876 Hypokalemia: Secondary | ICD-10-CM | POA: Diagnosis present

## 2021-02-11 DIAGNOSIS — Z8249 Family history of ischemic heart disease and other diseases of the circulatory system: Secondary | ICD-10-CM | POA: Diagnosis not present

## 2021-02-11 DIAGNOSIS — I110 Hypertensive heart disease with heart failure: Principal | ICD-10-CM | POA: Diagnosis present

## 2021-02-11 DIAGNOSIS — I447 Left bundle-branch block, unspecified: Secondary | ICD-10-CM | POA: Diagnosis not present

## 2021-02-11 DIAGNOSIS — Z833 Family history of diabetes mellitus: Secondary | ICD-10-CM

## 2021-02-11 DIAGNOSIS — I5043 Acute on chronic combined systolic (congestive) and diastolic (congestive) heart failure: Secondary | ICD-10-CM | POA: Diagnosis present

## 2021-02-11 DIAGNOSIS — D649 Anemia, unspecified: Secondary | ICD-10-CM | POA: Diagnosis not present

## 2021-02-11 DIAGNOSIS — N179 Acute kidney failure, unspecified: Secondary | ICD-10-CM | POA: Diagnosis not present

## 2021-02-11 DIAGNOSIS — Z20822 Contact with and (suspected) exposure to covid-19: Secondary | ICD-10-CM | POA: Diagnosis present

## 2021-02-11 DIAGNOSIS — R319 Hematuria, unspecified: Secondary | ICD-10-CM | POA: Diagnosis not present

## 2021-02-11 DIAGNOSIS — I471 Supraventricular tachycardia: Secondary | ICD-10-CM | POA: Diagnosis present

## 2021-02-11 DIAGNOSIS — E1165 Type 2 diabetes mellitus with hyperglycemia: Secondary | ICD-10-CM | POA: Diagnosis present

## 2021-02-11 DIAGNOSIS — I959 Hypotension, unspecified: Secondary | ICD-10-CM | POA: Diagnosis present

## 2021-02-11 DIAGNOSIS — R109 Unspecified abdominal pain: Secondary | ICD-10-CM | POA: Diagnosis not present

## 2021-02-11 DIAGNOSIS — E118 Type 2 diabetes mellitus with unspecified complications: Secondary | ICD-10-CM | POA: Diagnosis not present

## 2021-02-11 DIAGNOSIS — E871 Hypo-osmolality and hyponatremia: Secondary | ICD-10-CM | POA: Diagnosis not present

## 2021-02-11 DIAGNOSIS — I5023 Acute on chronic systolic (congestive) heart failure: Secondary | ICD-10-CM | POA: Diagnosis not present

## 2021-02-11 DIAGNOSIS — Z7982 Long term (current) use of aspirin: Secondary | ICD-10-CM | POA: Diagnosis not present

## 2021-02-11 DIAGNOSIS — I428 Other cardiomyopathies: Secondary | ICD-10-CM | POA: Diagnosis present

## 2021-02-11 DIAGNOSIS — I4719 Other supraventricular tachycardia: Secondary | ICD-10-CM | POA: Diagnosis present

## 2021-02-11 DIAGNOSIS — R0602 Shortness of breath: Secondary | ICD-10-CM | POA: Diagnosis not present

## 2021-02-11 DIAGNOSIS — J9811 Atelectasis: Secondary | ICD-10-CM | POA: Diagnosis not present

## 2021-02-11 DIAGNOSIS — R000 Tachycardia, unspecified: Secondary | ICD-10-CM | POA: Diagnosis not present

## 2021-02-11 DIAGNOSIS — Z79899 Other long term (current) drug therapy: Secondary | ICD-10-CM

## 2021-02-11 DIAGNOSIS — E785 Hyperlipidemia, unspecified: Secondary | ICD-10-CM | POA: Diagnosis present

## 2021-02-11 DIAGNOSIS — I517 Cardiomegaly: Secondary | ICD-10-CM | POA: Diagnosis not present

## 2021-02-11 DIAGNOSIS — R0600 Dyspnea, unspecified: Secondary | ICD-10-CM | POA: Diagnosis not present

## 2021-02-11 DIAGNOSIS — I484 Atypical atrial flutter: Secondary | ICD-10-CM | POA: Diagnosis not present

## 2021-02-11 DIAGNOSIS — I509 Heart failure, unspecified: Secondary | ICD-10-CM | POA: Diagnosis not present

## 2021-02-11 LAB — CBC
HCT: 34.6 % — ABNORMAL LOW (ref 36.0–46.0)
Hemoglobin: 11.3 g/dL — ABNORMAL LOW (ref 12.0–15.0)
MCH: 30.1 pg (ref 26.0–34.0)
MCHC: 32.7 g/dL (ref 30.0–36.0)
MCV: 92 fL (ref 80.0–100.0)
Platelets: 272 10*3/uL (ref 150–400)
RBC: 3.76 MIL/uL — ABNORMAL LOW (ref 3.87–5.11)
RDW: 15.5 % (ref 11.5–15.5)
WBC: 9.1 10*3/uL (ref 4.0–10.5)
nRBC: 0 % (ref 0.0–0.2)

## 2021-02-11 LAB — COMPREHENSIVE METABOLIC PANEL
ALT: 29 U/L (ref 0–44)
AST: 29 U/L (ref 15–41)
Albumin: 3.4 g/dL — ABNORMAL LOW (ref 3.5–5.0)
Alkaline Phosphatase: 76 U/L (ref 38–126)
Anion gap: 11 (ref 5–15)
BUN: 18 mg/dL (ref 6–20)
CO2: 29 mmol/L (ref 22–32)
Calcium: 8.3 mg/dL — ABNORMAL LOW (ref 8.9–10.3)
Chloride: 98 mmol/L (ref 98–111)
Creatinine, Ser: 1.04 mg/dL — ABNORMAL HIGH (ref 0.44–1.00)
GFR, Estimated: >60 mL/min (ref >60)
Glucose, Bld: 128 mg/dL — ABNORMAL HIGH (ref 70–99)
Potassium: 3.4 mmol/L — ABNORMAL LOW (ref 3.5–5.1)
Sodium: 138 mmol/L (ref 135–145)
Total Bilirubin: 1.1 mg/dL (ref 0.3–1.2)
Total Protein: 7.1 g/dL (ref 6.5–8.1)

## 2021-02-11 LAB — URINALYSIS, ROUTINE W REFLEX MICROSCOPIC
Bacteria, UA: NONE SEEN
Bilirubin Urine: NEGATIVE
Glucose, UA: NEGATIVE mg/dL
Ketones, ur: NEGATIVE mg/dL
Nitrite: NEGATIVE
Protein, ur: 100 mg/dL — AB
Specific Gravity, Urine: 1.012 (ref 1.005–1.030)
pH: 7 (ref 5.0–8.0)

## 2021-02-11 LAB — RAPID URINE DRUG SCREEN, HOSP PERFORMED
Amphetamines: NOT DETECTED
Barbiturates: NOT DETECTED
Benzodiazepines: NOT DETECTED
Cocaine: NOT DETECTED
Opiates: NOT DETECTED
Tetrahydrocannabinol: NOT DETECTED

## 2021-02-11 LAB — TROPONIN I (HIGH SENSITIVITY): Troponin I (High Sensitivity): 22 ng/L — ABNORMAL HIGH (ref <18)

## 2021-02-11 LAB — GLUCOSE, CAPILLARY: Glucose-Capillary: 120 mg/dL — ABNORMAL HIGH (ref 70–99)

## 2021-02-11 LAB — MAGNESIUM: Magnesium: 1.7 mg/dL (ref 1.7–2.4)

## 2021-02-11 LAB — BRAIN NATRIURETIC PEPTIDE: B Natriuretic Peptide: 1760 pg/mL — ABNORMAL HIGH (ref 0.0–100.0)

## 2021-02-11 LAB — RESP PANEL BY RT-PCR (FLU A&B, COVID) ARPGX2
Influenza A by PCR: NEGATIVE
Influenza B by PCR: NEGATIVE
SARS Coronavirus 2 by RT PCR: NEGATIVE

## 2021-02-11 LAB — TSH: TSH: 3.08 u[IU]/mL (ref 0.350–4.500)

## 2021-02-11 LAB — LIPASE, BLOOD: Lipase: 26 U/L (ref 11–51)

## 2021-02-11 MED ORDER — CHLORHEXIDINE GLUCONATE CLOTH 2 % EX PADS
6.0000 | MEDICATED_PAD | Freq: Every day | CUTANEOUS | Status: DC
  Administered 2021-02-11 – 2021-02-18 (×7): 6 via TOPICAL

## 2021-02-11 MED ORDER — POLYETHYLENE GLYCOL 3350 17 G PO PACK
17.0000 g | PACK | Freq: Every day | ORAL | Status: DC | PRN
  Filled 2021-02-11: qty 1

## 2021-02-11 MED ORDER — PANTOPRAZOLE SODIUM 40 MG PO TBEC
40.0000 mg | DELAYED_RELEASE_TABLET | Freq: Every day | ORAL | Status: DC
  Administered 2021-02-11 – 2021-02-20 (×10): 40 mg via ORAL
  Filled 2021-02-11 (×10): qty 1

## 2021-02-11 MED ORDER — INSULIN ASPART 100 UNIT/ML ~~LOC~~ SOLN
0.0000 [IU] | Freq: Three times a day (TID) | SUBCUTANEOUS | Status: DC
  Administered 2021-02-12 – 2021-02-14 (×3): 2 [IU] via SUBCUTANEOUS
  Administered 2021-02-15 – 2021-02-18 (×8): 1 [IU] via SUBCUTANEOUS

## 2021-02-11 MED ORDER — DILTIAZEM LOAD VIA INFUSION
10.0000 mg | Freq: Once | INTRAVENOUS | Status: AC
  Administered 2021-02-11: 10 mg via INTRAVENOUS
  Filled 2021-02-11: qty 10

## 2021-02-11 MED ORDER — ACETAMINOPHEN 325 MG PO TABS
650.0000 mg | ORAL_TABLET | Freq: Four times a day (QID) | ORAL | Status: DC | PRN
  Administered 2021-02-12 (×2): 650 mg via ORAL
  Filled 2021-02-11 (×2): qty 2

## 2021-02-11 MED ORDER — LOSARTAN POTASSIUM 50 MG PO TABS
100.0000 mg | ORAL_TABLET | Freq: Every day | ORAL | Status: DC
  Administered 2021-02-12: 100 mg via ORAL
  Filled 2021-02-11: qty 2

## 2021-02-11 MED ORDER — ONDANSETRON HCL 4 MG/2ML IJ SOLN
4.0000 mg | Freq: Four times a day (QID) | INTRAMUSCULAR | Status: DC | PRN
  Administered 2021-02-12 – 2021-02-17 (×3): 4 mg via INTRAVENOUS
  Filled 2021-02-11 (×4): qty 2

## 2021-02-11 MED ORDER — SODIUM CHLORIDE 0.9% FLUSH
3.0000 mL | Freq: Two times a day (BID) | INTRAVENOUS | Status: DC
  Administered 2021-02-12 – 2021-02-19 (×11): 3 mL via INTRAVENOUS

## 2021-02-11 MED ORDER — POTASSIUM CHLORIDE CRYS ER 20 MEQ PO TBCR
40.0000 meq | EXTENDED_RELEASE_TABLET | Freq: Once | ORAL | Status: AC
  Administered 2021-02-11: 40 meq via ORAL
  Filled 2021-02-11: qty 2

## 2021-02-11 MED ORDER — SODIUM CHLORIDE 0.9% FLUSH
3.0000 mL | Freq: Two times a day (BID) | INTRAVENOUS | Status: DC
  Administered 2021-02-12 – 2021-02-15 (×6): 3 mL via INTRAVENOUS

## 2021-02-11 MED ORDER — DILTIAZEM HCL-DEXTROSE 125-5 MG/125ML-% IV SOLN (PREMIX)
5.0000 mg/h | INTRAVENOUS | Status: DC
  Administered 2021-02-11: 5 mg/h via INTRAVENOUS
  Filled 2021-02-11 (×2): qty 125

## 2021-02-11 MED ORDER — ASPIRIN 81 MG PO CHEW: 81.0000 mg | CHEWABLE_TABLET | Freq: Every day | ORAL | Status: DC

## 2021-02-11 MED ORDER — METOPROLOL SUCCINATE ER 25 MG PO TB24
25.0000 mg | ORAL_TABLET | Freq: Every day | ORAL | Status: DC
  Administered 2021-02-11: 25 mg via ORAL
  Filled 2021-02-11: qty 1

## 2021-02-11 MED ORDER — TRAZODONE HCL 50 MG PO TABS
50.0000 mg | ORAL_TABLET | Freq: Every evening | ORAL | Status: DC | PRN
  Administered 2021-02-12 – 2021-02-19 (×6): 50 mg via ORAL
  Filled 2021-02-11 (×6): qty 1

## 2021-02-11 MED ORDER — FUROSEMIDE 10 MG/ML IJ SOLN
40.0000 mg | Freq: Two times a day (BID) | INTRAMUSCULAR | Status: DC
  Administered 2021-02-11 – 2021-02-12 (×2): 40 mg via INTRAVENOUS
  Filled 2021-02-11 (×2): qty 4

## 2021-02-11 MED ORDER — ACETAMINOPHEN 650 MG RE SUPP: 650.0000 mg | Freq: Four times a day (QID) | RECTAL | Status: DC | PRN

## 2021-02-11 MED ORDER — INSULIN ASPART 100 UNIT/ML ~~LOC~~ SOLN: 0.0000 [IU] | Freq: Every day | SUBCUTANEOUS | Status: DC

## 2021-02-11 MED ORDER — ONDANSETRON HCL 4 MG PO TABS: 4.0000 mg | ORAL_TABLET | Freq: Four times a day (QID) | ORAL | Status: DC | PRN

## 2021-02-11 MED ORDER — METOPROLOL TARTRATE 5 MG/5ML IV SOLN
5.0000 mg | Freq: Once | INTRAVENOUS | Status: AC
  Administered 2021-02-11: 5 mg via INTRAVENOUS
  Filled 2021-02-11: qty 5

## 2021-02-11 MED ORDER — BISACODYL 10 MG RE SUPP: 10.0000 mg | Freq: Every day | RECTAL | Status: DC | PRN

## 2021-02-11 MED ORDER — ATORVASTATIN CALCIUM 80 MG PO TABS
80.0000 mg | ORAL_TABLET | Freq: Every day | ORAL | Status: DC
  Administered 2021-02-12 – 2021-02-20 (×9): 80 mg via ORAL
  Filled 2021-02-11: qty 2
  Filled 2021-02-11 (×6): qty 1
  Filled 2021-02-11: qty 2
  Filled 2021-02-11 (×2): qty 1

## 2021-02-11 MED ORDER — APIXABAN 5 MG PO TABS
5.0000 mg | ORAL_TABLET | Freq: Two times a day (BID) | ORAL | Status: DC
  Administered 2021-02-11 – 2021-02-12 (×2): 5 mg via ORAL
  Filled 2021-02-11 (×2): qty 1

## 2021-02-11 MED ORDER — ALBUTEROL SULFATE HFA 108 (90 BASE) MCG/ACT IN AERS
1.0000 | INHALATION_SPRAY | RESPIRATORY_TRACT | Status: DC | PRN
  Filled 2021-02-11: qty 6.7

## 2021-02-11 MED ORDER — CARVEDILOL 3.125 MG PO TABS: 6.2500 mg | ORAL_TABLET | Freq: Two times a day (BID) | ORAL | Status: DC

## 2021-02-11 MED ORDER — SODIUM CHLORIDE 0.9 % IV SOLN: 250.0000 mL | INTRAVENOUS | Status: DC | PRN

## 2021-02-11 MED ORDER — SODIUM CHLORIDE 0.9% FLUSH: 3.0000 mL | INTRAVENOUS | Status: DC | PRN

## 2021-02-11 NOTE — H&P (Signed)
Patient Demographics:    Michelle Morrison, is a 60 y.o. female  MRN: [id number]   DOB - March 10, 1961  Admit Date - 02/11/2021  Outpatient Primary MD for the patient is Rosita Fire, MD   Assessment & Plan:    Principal Problem:   Paroxysmal A-fib/Flutter(HCC) Active Problems:   Diabetes mellitus type 2 with complications (HCC)   HTN (hypertension), benign   Acute on chronic combined systolic and diastolic CHF (congestive heart failure) (HCC)   Atrial fibrillation with RVR ([place])  1)Paroxysmal Atrial Tachycardia--- Prior H/o PSVT monitor 04/2020 several runs of atrial tachycardia with aberrancy up to 30 seconds followed by postconversion pauses up to 1.4 seconds and 5 beat run of NSVT -- in the ED today on the monitor patient with recurrent atrial tachyarrhythmias, QRS complexes not narrow at times suggesting aberrant conduction--- -TSH is 3.0, potassium is 3.4, check magnesium and replace potassium --EDP tried metoprolol with no respond, after discussion with on-call cardiologist EDP advised to use IV Cardizem for rate control -EDP also initiated patient on Eliquis -Serial EKGs in the ED reviewed again mostly atrial tachycardia, episodes of A. fib as well as episodes of sinus tachycardia Currently on Coreg may consider switching to metoprolol for better rate control -Also given borderline low EF of 45% metoprolol probably be a better option for rate controlled on Cardizem in the long run -Denies chest pains  2)NICM/chronic combined systolic and diastolic CHF LVEF 33% with anterolateral wall hypokinesis and grade 2 DD 03/2020 compensated without evidence of heart failure -LHC from 04/14/2020 without obstructive CAD -Clinically appears to have mild CHF exacerbation at this time most likely due to tachycardia- -switch from  oral Lasix to IV Lasix -Daily weights and fluid input and output charting -Chest x-ray with vascular congestion -Troponin is 22, will check BNP -No hypoxia  3)HTN--- okay to continue losartan 100 mg daily, Coreg 6.25 mg twice daily -Patient has been started on IV Cardizem for rate control additional also help with BP  4)DM2-A1c was 7.3 about 8 months ago reflecting uncontrolled DM with hyperglycemia PTA -Hold Tradjenta Use Novolog/Humalog Sliding scale insulin with Accu-Cheks/Fingersticks as ordered  5) acute anemia--hemoglobin at 11.3, appears normocytic and normochromic  baseline usually around 13 -Denies dark stools or GI bleed concerns -Check stool for occult blood especially with EDP initiating Eliquis -Anemia work-up requested   Disposition/Need for in-Hospital Stay- patient unable to be discharged at this time due to --atrial tachycardia, symptomatic requiring IV Cardizem for rate control after failing metoprolol  Dispo: The patient is from: Home              Anticipated d/c is to: Home              Anticipated d/c date is: 2 days              Patient currently is not medically stable to d/c. Barriers: Not Clinically Stable-   With History of - Reviewed  by me  Past Medical History:  Diagnosis Date  . CHF (congestive heart failure) ([place])    a. EF 45% by echo in 03/2020 with normal cors by cath  . Diabetes mellitus without complication ([address])   . Diarrhea   . Herpes simplex infection   . Hyperlipidemia   . Hypertension       Past Surgical History:  Procedure Laterality Date  . CESAREAN SECTION    . CHOLECYSTECTOMY    . COLONOSCOPY N/A 08/26/2014   Procedure: COLONOSCOPY;  Surgeon: [name], MD;  Location: [facility];  Service: Endoscopy;  Laterality: N/A;  9:30 AM - moved to 8:30 - Ginger to notify pt  . LEFT HEART CATH AND CORONARY ANGIOGRAPHY N/A 04/14/2020   Procedure: LEFT HEART CATH AND CORONARY ANGIOGRAPHY;  Surgeon: [name], [name], MD;  Location: [facility];  Service: Cardiovascular;  Laterality: N/A;    Chief Complaint  Patient presents with  . Abdominal Pain      HPI:    Michelle Morrison  is a 60 y.o. female with past medical history relevant for DM2, chronic combined systolic and diastolic CHF LVEF 00% with anterolateral wall hypokinesis and grade 2 DD on echo 03/2020, cardiac cath 04/14/2020 normal coronary anatomy, hypertension, HLD, Prior H/o PSVT monitor 04/2020 several runs of atrial tachycardia with aberrancy up to 30 seconds followed by postconversion pauses up to 1.4 seconds and 5 beat run of NSVT -- in the ED today on the monitor patient with recurrent atrial tachyarrhythmias, QRS complexes not narrow at times suggesting aberrant conduction--- -TSH is 3.0, potassium is 3.4, check magnesium --EDP tried metoprolol with no respond, after discussion with on-call cardiologist EDP advised to use IV Cardizem for rate control -EDP also initiated patient on Eliquis -Serial EKGs in the ED reviewed again mostly atrial tachycardia, episodes of A. fib as well as episodes of sinus tachycardia  -Chest x-ray consistent with vascular congestion, BNP pending, troponin 22 -Patient complains of abdominal discomfort no frank chest pains, she does have some nausea, but no emesis -Last BM was this morning No fever  Or chills  -No cough no pleuritic symptoms no leg swelling  -hemoglobin at 11.3, appears normocytic and normochromic  baseline usually around 13 -Denies GI bleed -Abdominal x-rays without acute findings   Review of systems:    In addition to the HPI above,   A full Review of  Systems was done, all other systems reviewed are negative except as noted above in HPI , .    Social History:  Reviewed by me   Social History   Tobacco Use  . Smoking status: Never Smoker  . Smokeless tobacco: Never Used  Substance Use Topics  . Alcohol use: No    Alcohol/week: 0.0 standard drinks     Family History :  Reviewed by me     Family History  Problem Relation Age of Onset  . Hypertension Mother   . Diabetes Mother   . Colon cancer Neg Hx      Home Medications:   Prior to Admission medications   Medication Sig Start Date End Date Taking? Authorizing Provider  albuterol (VENTOLIN HFA) 108 (90 Base) MCG/ACT inhaler Inhale 1-2 puffs into the lungs every 4 (four) hours as needed. 05/30/20  Yes [provider]  aspirin 81 MG chewable tablet Chew 1 tablet (81 mg total) by mouth daily. 04/16/20  Yes Hosie Poisson, MD  atorvastatin (LIPITOR) 80 MG tablet Take 80 mg by mouth  daily. 04/15/20  Yes [provider]  carvedilol (COREG) 6.25 MG tablet Take 1 tablet (6.25 mg total) by mouth 2 (two) times daily with a meal. 06/10/20  Yes Rai, Ripudeep K, MD  furosemide (LASIX) 40 MG tablet Take 1 tablet (40 mg total) by mouth daily. 06/11/20  Yes Rai, Ripudeep K, MD  ibuprofen (ADVIL) 600 MG tablet Take 600 mg by mouth every 6 (six) hours as needed. 01/16/21  Yes [provider]  losartan (COZAAR) 100 MG tablet Take 1 tablet (100 mg total) by mouth daily. 08/07/20 11/05/20 Yes Branch, Alphonse Guild, MD  TRADJENTA 5 MG TABS tablet Take 5 mg by mouth daily.  07/28/16  Yes [provider]  phenazopyridine (PYRIDIUM) 200 MG tablet Take 1 tablet (200 mg total) by mouth 3 (three) times daily as needed for pain. Patient not taking: No sig reported 01/04/21   Cresenzo-Dishmon, Joaquim Lai, CNM  sulfamethoxazole-trimethoprim (BACTRIM DS) 800-160 MG tablet Take 1 tablet by mouth 2 (two) times daily. Patient not taking: No sig reported 01/04/21   Christin Fudge, CNM    Allergies:    No Known Allergies   Physical Exam:   Vitals  Blood pressure (!) 145/104, pulse (!) 146, temperature 97.9 F (36.6 C), temperature source Oral, resp. rate 18, height 5\' 5"  (1.651 m), weight 74.4 kg, last menstrual period 05/23/2007, SpO2 100 %.  Physical Examination: General appearance - alert,   and in no distress  Mental  status - alert, oriented to person, place, and time,  Eyes - sclera anicteric Neck - supple, no JVD elevation , Chest -diminished breath sounds, no wheezing  heart - S1 and S2 normal, irregular and tachycardic Abdomen - soft, nontender, nondistended, no masses or organomegaly Neurological - screening mental status exam normal, neck supple without rigidity, cranial nerves II through XII intact, DTR's normal and symmetric Extremities - no pedal edema noted, intact peripheral pulses  Skin - warm, dry    Data Review:    CBC Recent Labs  Lab 02/11/21 1741  WBC 9.1  HGB 11.3*  HCT 34.6*  PLT 272  MCV 92.0  MCH 30.1  MCHC 32.7  RDW 15.5   Chemistries  Recent Labs  Lab 02/11/21 1741  NA 138  K 3.4*  CL 98  CO2 29  GLUCOSE 128*  BUN 18  CREATININE 1.04*  CALCIUM 8.3*  AST 29  ALT 29  ALKPHOS 76  BILITOT 1.1   ------------------------------------------------------------------------------------------------------------------ estimated creatinine clearance is 59.6 mL/min (A) (by C-G formula based on SCr of 1.04 mg/dL (H)). ------------------------------------------------------------------------------------------------------------------ Recent Labs    02/11/21 1741  TSH 3.080     Coagulation profile No results for input(s): INR, PROTIME in the last 168 hours. ------------------------------------------------------------------------------------------------------------------- No results for input(s): DDIMER in the last 72 hours. -------------------------------------------------------------------------------------------------------------------  Cardiac Enzymes No results for input(s): CKMB, TROPONINI, MYOGLOBIN in the last 168 hours.  Invalid input(s): CK ------------------------------------------------------------------------------------------------------------------    Component Value Date/Time   BNP 260.0 (H) 06/09/2020 0358    Urinalysis    Component Value  Date/Time   COLORURINE YELLOW 02/11/2021 2018   APPEARANCEUR HAZY (A) 02/11/2021 2018   APPEARANCEUR Cloudy (A) 09/25/2015 1230   LABSPEC 1.012 02/11/2021 2018   PHURINE 7.0 02/11/2021 2018   GLUCOSEU NEGATIVE 02/11/2021 2018   HGBUR SMALL (A) 02/11/2021 2018   BILIRUBINUR NEGATIVE 02/11/2021 2018   BILIRUBINUR Negative 09/25/2015 [place] 02/11/2021 2018   PROTEINUR 100 (A) 02/11/2021 2018   NITRITE NEGATIVE 02/11/2021 2018   LEUKOCYTESUR SMALL (  A) 02/11/2021 2018     Imaging Results:    DG Chest Port 1 View  Result Date: 02/11/2021 CLINICAL DATA:  Shortness of breath EXAM: PORTABLE CHEST 1 VIEW COMPARISON:  06/09/2020 FINDINGS: Cardiomegaly, vascular congestion. No overt edema. Bibasilar opacities, likely atelectasis. No visible significant effusions or pneumothorax. No acute bony abnormality. IMPRESSION: Cardiomegaly, vascular congestion. Bibasilar atelectasis. Electronically Signed   By: Rolm Baptise M.D.   On: 02/11/2021 18:46    Radiological Exams on Admission: DG Chest Port 1 View  Result Date: 02/11/2021 CLINICAL DATA:  Shortness of breath EXAM: PORTABLE CHEST 1 VIEW COMPARISON:  06/09/2020 FINDINGS: Cardiomegaly, vascular congestion. No overt edema. Bibasilar opacities, likely atelectasis. No visible significant effusions or pneumothorax. No acute bony abnormality. IMPRESSION: Cardiomegaly, vascular congestion. Bibasilar atelectasis. Electronically Signed   By: Rolm Baptise M.D.   On: 02/11/2021 18:46    DVT Prophylaxis -SCD /eliquis AM Labs Ordered, also please review Full Orders  Family Communication: Admission, patients condition and plan of care including tests being ordered have been discussed with the patient  who indicate understanding and agree with the plan   Code Status - Full Code  Likely DC to  Home after improvement in CHF and resolution of tachycardia  Condition   stable Roxan Hockey M.D on 02/11/2021 at 8:58 PM Go to [email or web address] -   for contact info  [facility] - Office  [phone]

## 2021-02-11 NOTE — ED Triage Notes (Signed)
Pt to er, pt states that she is here for abd pain, states that her abd pain started about two days ago. States that her pain seems to come and go.  States that she tried to eat, but then the pain was worse.  Pt also c/o nausea.

## 2021-02-11 NOTE — ED Notes (Signed)
Patient sitting in bed comfortable and in no acute distress, talking on phone with family. HR ranging from 160's and will drop into 80's, then return to 160's within a few seconds. Pt not symptomatic at this time and is resting comfortable. MD aware of status. Bed locked and in lowest position. Call light in reach. Will continue to monitor.

## 2021-02-11 NOTE — ED Provider Notes (Signed)
[facility] Provider Note   CSN: [id number] Arrival date & time: 02/11/21  1712     History Chief Complaint  Patient presents with  . Abdominal Pain    Michelle Morrison is a 60 y.o. female.  HPI   This patient is a 60 year old female, she has a history of congestive heart failure, according to the medical record which I have thoroughly reviewed she had an ejection fraction of 45% in May 2021 on echocardiogram and had normal coronary arteries based on a heart catheterization shortly after that.  She is known to have grade 2 diastolic dysfunction.  She presents to the hospital today with a complaint of upper abdominal pain and epigastric pain radiating into the left upper quadrant, this seems to also radiate to her back, this has been going on for some time, she thinks that it may have been going on for the better part of the last week but today it was increased in intensity.  She had no associated shortness of breath coughing fevers or swelling of the legs and has had no dysuria diarrhea or change in appetite.  She denies any history of anemia.  According to the medical record the patient currently takes a baby aspirin, Lipitor, Coreg, Lasix, Cozaar and Tradjenta.  The symptoms are intermittent, nothing seems to make it better or worse, possibly something to do with eating but she is unsure.  She reports that she has already had her gallbladder out but no other abdominal surgery.  Past Medical History:  Diagnosis Date  . CHF (congestive heart failure) ([place])    a. EF 45% by echo in 03/2020 with normal cors by cath  . Diabetes mellitus without complication ([place])   . Diarrhea   . Herpes simplex infection   . Hyperlipidemia   . Hypertension     Patient Active Problem List   Diagnosis Date Noted  . Paroxysmal A-fib/Flutter(H[facility]) 02/11/2021  . Diabetes mellitus type 2 with complications ([place]) 34/19/3790  . Acute on chronic combined systolic and diastolic CHF  (congestive heart failure) ([place]) 06/09/2020  . Hypertensive urgency   . Elevated troponin   . Dyspnea due to congestive heart failure ([place]) 04/13/2020  . Accelerated hypertension 04/13/2020  . Hyperglycemia due to diabetes mellitus ([place]) 04/13/2020  . 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 Herpes simplex infection 11/21/2015  . HTN (hypertension), benign 04/01/2013    Past Surgical History:  Procedure Laterality Date  . CESAREAN SECTION    . CHOLECYSTECTOMY    . COLONOSCOPY N/A 08/26/2014   Procedure: COLONOSCOPY;  Surgeon: [name], MD;  Location: [facility];  Service: Endoscopy;  Laterality: N/A;  9:30 AM - moved to 8:30 - [place] to notify pt  . LEFT HEART CATH AND CORONARY ANGIOGRAPHY N/A 04/14/2020   Procedure: LEFT HEART CATH AND CORONARY ANGIOGRAPHY;  Surgeon: [name], [name], MD;  Location: [facility];  Service: Cardiovascular;  Laterality: N/A;     OB History    Gravida  2   Para  2   Term  1   Preterm  1   AB      Living  2     SAB      IAB      Ectopic      Multiple      Live Births  2           Family History  Problem Relation Age of Onset  . Hypertension Mother   .  Diabetes Mother   . Colon cancer Neg Hx     Social History   Tobacco Use  . Smoking status: Never Smoker  . Smokeless tobacco: Never Used  Vaping Use  . Vaping Use: Never used  Substance Use Topics  . Alcohol use: No    Alcohol/week: 0.0 standard drinks  . Drug use: No    Home Medications Prior to Admission medications   Medication Sig Start Date End Date Taking? Authorizing Provider  albuterol (VENTOLIN HFA) 108 (90 Base) MCG/ACT inhaler Inhale 1-2 puffs into the lungs every 4 (four) hours as needed. 05/30/20  Yes [provider]  aspirin 81 MG chewable tablet Chew 1 tablet (81 mg total) by mouth daily. 04/16/20  Yes Hosie Poisson, MD  atorvastatin (LIPITOR) 80 MG tablet Take 80 mg by mouth daily. 04/15/20  Yes [provider]   carvedilol (COREG) 6.25 MG tablet Take 1 tablet (6.25 mg total) by mouth 2 (two) times daily with a meal. 06/10/20  Yes Rai, Ripudeep K, MD  furosemide (LASIX) 40 MG tablet Take 1 tablet (40 mg total) by mouth daily. 06/11/20  Yes Rai, Ripudeep K, MD  ibuprofen (ADVIL) 600 MG tablet Take 600 mg by mouth every 6 (six) hours as needed. 01/16/21  Yes [provider]  losartan (COZAAR) 100 MG tablet Take 1 tablet (100 mg total) by mouth daily. 08/07/20 11/05/20 Yes Branch, Alphonse Guild, MD  TRADJENTA 5 MG TABS tablet Take 5 mg by mouth daily.  07/28/16  Yes [provider]  phenazopyridine (PYRIDIUM) 200 MG tablet Take 1 tablet (200 mg total) by mouth 3 (three) times daily as needed for pain. Patient not taking: No sig reported 01/04/21   Cresenzo-Dishmon, Joaquim Lai, CNM  sulfamethoxazole-trimethoprim (BACTRIM DS) 800-160 MG tablet Take 1 tablet by mouth 2 (two) times daily. Patient not taking: No sig reported 01/04/21   Christin Fudge, CNM    Allergies    Patient has no known allergies.  Review of Systems   Review of Systems  All other systems reviewed and are negative.   Physical Exam Updated Vital Signs BP (!) 145/104   Pulse (!) 146   Temp 97.9 F (36.6 C) (Oral)   Resp 18   Ht 1.651 m (5\' 5" )   Wt 74.4 kg   LMP 05/23/2007   SpO2 100%   BMI 27.29 kg/m   Physical Exam Vitals and nursing note reviewed.  Constitutional:      General: She is in acute distress.     Appearance: She is well-developed.  HENT:     Head: Normocephalic and atraumatic.     Mouth/Throat:     Mouth: Mucous membranes are moist.     Pharynx: No oropharyngeal exudate.     Comments: Pale MM Eyes:     General: No scleral icterus.       Right eye: No discharge.        Left eye: No discharge.     Pupils: Pupils are equal, round, and reactive to light.     Comments: Pale conj  Neck:     Thyroid: No thyromegaly.     Vascular: No JVD.     Comments: No JVD Cardiovascular:     Rate and  Rhythm: Regular rhythm. Tachycardia present.     Heart sounds: Normal heart sounds. No murmur heard. No friction rub. No gallop.      Comments: HR varies from a NSR type rhythm to a tachycardia which seems very regular and then back  to a sinus rhythm. Pulmonary:     Effort: Pulmonary effort is normal. No respiratory distress.     Breath sounds: Normal breath sounds. No wheezing or rales.  Abdominal:     General: Bowel sounds are normal. There is no distension.     Palpations: Abdomen is soft. There is no mass.     Tenderness: There is no abdominal tenderness.     Comments: There is no abdominal tenderness, the patient does have a high body mass index but no focal tenderness or guarding  Musculoskeletal:        General: No tenderness. Normal range of motion.     Cervical back: Normal range of motion and neck supple.  Lymphadenopathy:     Cervical: No cervical adenopathy.  Skin:    General: Skin is warm and dry.     Findings: No erythema or rash.  Neurological:     General: No focal deficit present.     Mental Status: She is alert.     Coordination: Coordination normal.  Psychiatric:        Behavior: Behavior normal.     ED Results / Procedures / Treatments   Labs (all labs ordered are listed, but only abnormal results are displayed) Labs Reviewed  COMPREHENSIVE METABOLIC PANEL - Abnormal; Notable for the following components:      Result Value   Potassium 3.4 (*)    Glucose, Bld 128 (*)    Creatinine, Ser 1.04 (*)    Calcium 8.3 (*)    Albumin 3.4 (*)    All other components within normal limits  CBC - Abnormal; Notable for the following components:   RBC 3.76 (*)    Hemoglobin 11.3 (*)    HCT 34.6 (*)    All other components within normal limits  TROPONIN I (HIGH SENSITIVITY) - Abnormal; Notable for the following components:   Troponin I (High Sensitivity) 22 (*)    All other components within normal limits  RESP PANEL BY RT-PCR (FLU A&B, COVID) ARPGX2  LIPASE, BLOOD   TSH  URINALYSIS, ROUTINE W REFLEX MICROSCOPIC  RAPID URINE DRUG SCREEN, HOSP PERFORMED  HEMOGLOBIN A1C    EKG EKG Interpretation  Date/Time:  Sunday February 11 2021 17:43:54 EDT Ventricular Rate:  137 PR Interval:    QRS Duration: 118 QT Interval:  310 QTC Calculation: 468 R Axis:   91 Text Interpretation: Atrial fibrillation with rapid ventricular response  vs flutter - appears to have a conversion to sinus rhythm. Rightward axis Low voltage QRS Cannot rule out Anterior infarct , age undetermined Abnormal ECG Confirmed by Noemi Chapel [phone]) on 02/11/2021 5:46:16 PM   Radiology DG Chest Port 1 View  Result Date: 02/11/2021 CLINICAL DATA:  Shortness of breath EXAM: PORTABLE CHEST 1 VIEW COMPARISON:  06/09/2020 FINDINGS: Cardiomegaly, vascular congestion. No overt edema. Bibasilar opacities, likely atelectasis. No visible significant effusions or pneumothorax. No acute bony abnormality. IMPRESSION: Cardiomegaly, vascular congestion. Bibasilar atelectasis. Electronically Signed   By: Rolm Baptise M.D.   On: 02/11/2021 18:46    Procedures .Critical Care Performed by: Noemi Chapel, MD Authorized by: Noemi Chapel, MD   Critical care provider statement:    Critical care time (minutes):  35   Critical care time was exclusive of:  Separately billable procedures and treating other patients and teaching time   Critical care was necessary to treat or prevent imminent or life-threatening deterioration of the following conditions:  Cardiac failure   Critical care was time spent personally by me  on the following activities:  Blood draw for specimens, development of treatment plan with patient or surrogate, discussions with consultants, evaluation of patient's response to treatment, examination of patient, obtaining history from patient or surrogate, ordering and performing treatments and interventions, ordering and review of laboratory studies, ordering and review of radiographic studies,  pulse oximetry, re-evaluation of patient's condition and review of old charts     Medications Ordered in ED Medications  metoprolol succinate (TOPROL-XL) 24 hr tablet 25 mg (25 mg Oral Given 02/11/21 1831)  diltiazem (CARDIZEM) 1 mg/mL load via infusion 10 mg (has no administration in time range)    And  diltiazem (CARDIZEM) 125 mg in dextrose 5% 125 mL (1 mg/mL) infusion (5 mg/hr Intravenous New Bag/Given 02/11/21 2020)  apixaban (ELIQUIS) tablet 5 mg (has no administration in time range)  insulin aspart (novoLOG) injection 0-9 Units (has no administration in time range)  insulin aspart (novoLOG) injection 0-5 Units (has no administration in time range)  metoprolol tartrate (LOPRESSOR) injection 5 mg (5 mg Intravenous Given 02/11/21 1805)    ED Course  I have reviewed the triage vital signs and the nursing notes.  Pertinent labs & imaging results that were available during my care of the patient were reviewed by me and considered in my medical decision making (see chart for details).  Clinical Course as of 02/11/21 2021  Sun Feb 11, 2021  2683 Discussed with on-call cardiology - reccomends that the pat be started on Metoprolol XL 25mg  and avoid anticoagulation - with close f/u outpt if she responds well. [BM]    Clinical Course User Index [BM] Noemi Chapel, MD   MDM Rules/Calculators/A&P                          The patient's initial EKG on arrival showed what appeared to be a rapid wide-complex tachycardia which converted to sinus rhythm during the course of the EKG.  Review of old EKGs show that she has a wide bundle branch block at baseline.  In the room during evaluation it was clear that the patient was having bouts of normal sinus rhythm mixed with bouts of tachycardia which all appeared to be regular and rapid.  She does not have any history of these types of arrhythmias that I can tell.  Her abdominal exam is benign, of note the patient did seem to have increasing abdominal pain  when her heart rate would go rapid suggesting that this may be related.  At this time the patient will need cardiac evaluation monitoring rate control and consultation with cardiology.  She does not use alcohol / nsaids and has no hx of GERD / gastrities  Pancreatitis  The patient's labs have thankfully been very reassuring, lipase metabolic panel and CBC.  Troponin was measurable at 22 but this is lower than it has been in the past.  TSH was normal, chest x-ray did not show any acute finding, and unfortunately the patient despite getting both IV and oral beta-blockers continue to be tachycardic averaging heart rates between 140 and 160 bpm.  Because of this I elected to place the patient on a Cardizem drip, she will need to be admitted to the hospital, discussed with Dr. Roxan Hockey, he will admit the patient to hospital  Final Clinical Impression(s) / ED Diagnoses Final diagnoses:  Atrial flutter with rapid ventricular response ([place])      Noemi Chapel, MD 02/11/21 2021

## 2021-02-12 ENCOUNTER — Inpatient Hospital Stay: Payer: Self-pay

## 2021-02-12 ENCOUNTER — Inpatient Hospital Stay (HOSPITAL_COMMUNITY): Payer: Medicare Other

## 2021-02-12 DIAGNOSIS — E876 Hypokalemia: Secondary | ICD-10-CM | POA: Diagnosis present

## 2021-02-12 DIAGNOSIS — N179 Acute kidney failure, unspecified: Secondary | ICD-10-CM | POA: Diagnosis not present

## 2021-02-12 DIAGNOSIS — I071 Rheumatic tricuspid insufficiency: Secondary | ICD-10-CM | POA: Diagnosis present

## 2021-02-12 DIAGNOSIS — I959 Hypotension, unspecified: Secondary | ICD-10-CM | POA: Diagnosis present

## 2021-02-12 DIAGNOSIS — I4891 Unspecified atrial fibrillation: Secondary | ICD-10-CM

## 2021-02-12 DIAGNOSIS — I428 Other cardiomyopathies: Secondary | ICD-10-CM | POA: Diagnosis not present

## 2021-02-12 DIAGNOSIS — Z20822 Contact with and (suspected) exposure to covid-19: Secondary | ICD-10-CM | POA: Diagnosis not present

## 2021-02-12 DIAGNOSIS — I110 Hypertensive heart disease with heart failure: Secondary | ICD-10-CM | POA: Diagnosis not present

## 2021-02-12 DIAGNOSIS — Z7982 Long term (current) use of aspirin: Secondary | ICD-10-CM | POA: Diagnosis not present

## 2021-02-12 DIAGNOSIS — Z452 Encounter for adjustment and management of vascular access device: Secondary | ICD-10-CM | POA: Diagnosis not present

## 2021-02-12 DIAGNOSIS — I5043 Acute on chronic combined systolic (congestive) and diastolic (congestive) heart failure: Secondary | ICD-10-CM | POA: Diagnosis not present

## 2021-02-12 DIAGNOSIS — I447 Left bundle-branch block, unspecified: Secondary | ICD-10-CM | POA: Diagnosis not present

## 2021-02-12 DIAGNOSIS — I10 Essential (primary) hypertension: Secondary | ICD-10-CM | POA: Diagnosis not present

## 2021-02-12 DIAGNOSIS — E785 Hyperlipidemia, unspecified: Secondary | ICD-10-CM | POA: Diagnosis present

## 2021-02-12 DIAGNOSIS — E118 Type 2 diabetes mellitus with unspecified complications: Secondary | ICD-10-CM

## 2021-02-12 DIAGNOSIS — I471 Supraventricular tachycardia: Secondary | ICD-10-CM | POA: Diagnosis present

## 2021-02-12 DIAGNOSIS — I509 Heart failure, unspecified: Secondary | ICD-10-CM | POA: Diagnosis not present

## 2021-02-12 DIAGNOSIS — I495 Sick sinus syndrome: Secondary | ICD-10-CM | POA: Diagnosis not present

## 2021-02-12 DIAGNOSIS — Z8249 Family history of ischemic heart disease and other diseases of the circulatory system: Secondary | ICD-10-CM | POA: Diagnosis not present

## 2021-02-12 DIAGNOSIS — I480 Paroxysmal atrial fibrillation: Secondary | ICD-10-CM | POA: Diagnosis not present

## 2021-02-12 DIAGNOSIS — R0600 Dyspnea, unspecified: Secondary | ICD-10-CM

## 2021-02-12 DIAGNOSIS — I5023 Acute on chronic systolic (congestive) heart failure: Secondary | ICD-10-CM | POA: Diagnosis not present

## 2021-02-12 DIAGNOSIS — Z79899 Other long term (current) drug therapy: Secondary | ICD-10-CM | POA: Diagnosis not present

## 2021-02-12 DIAGNOSIS — E1165 Type 2 diabetes mellitus with hyperglycemia: Secondary | ICD-10-CM | POA: Diagnosis not present

## 2021-02-12 DIAGNOSIS — I5082 Biventricular heart failure: Secondary | ICD-10-CM | POA: Diagnosis present

## 2021-02-12 DIAGNOSIS — Z833 Family history of diabetes mellitus: Secondary | ICD-10-CM | POA: Diagnosis not present

## 2021-02-12 DIAGNOSIS — R319 Hematuria, unspecified: Secondary | ICD-10-CM | POA: Diagnosis not present

## 2021-02-12 DIAGNOSIS — I472 Ventricular tachycardia: Secondary | ICD-10-CM | POA: Diagnosis not present

## 2021-02-12 DIAGNOSIS — I484 Atypical atrial flutter: Secondary | ICD-10-CM | POA: Diagnosis not present

## 2021-02-12 DIAGNOSIS — E871 Hypo-osmolality and hyponatremia: Secondary | ICD-10-CM | POA: Diagnosis not present

## 2021-02-12 DIAGNOSIS — R110 Nausea: Secondary | ICD-10-CM | POA: Diagnosis present

## 2021-02-12 DIAGNOSIS — D649 Anemia, unspecified: Secondary | ICD-10-CM | POA: Diagnosis present

## 2021-02-12 LAB — ECHOCARDIOGRAM COMPLETE
AR max vel: 2.2 cm2
AV Area VTI: 2.03 cm2
AV Area mean vel: 2.18 cm2
AV Mean grad: 2.5 mmHg
AV Peak grad: 4.4 mmHg
Ao pk vel: 1.05 m/s
Area-P 1/2: 6.6 cm2
Height: 65 in
S' Lateral: 3.8 cm
Weight: 2698.43 oz

## 2021-02-12 LAB — BASIC METABOLIC PANEL WITH GFR
Anion gap: 12 (ref 5–15)
BUN: 18 mg/dL (ref 6–20)
CO2: 28 mmol/L (ref 22–32)
Calcium: 8.3 mg/dL — ABNORMAL LOW (ref 8.9–10.3)
Chloride: 98 mmol/L (ref 98–111)
Creatinine, Ser: 0.89 mg/dL (ref 0.44–1.00)
GFR, Estimated: >60 mL/min
Glucose, Bld: 101 mg/dL — ABNORMAL HIGH (ref 70–99)
Potassium: 3.6 mmol/L (ref 3.5–5.1)
Sodium: 138 mmol/L (ref 135–145)

## 2021-02-12 LAB — COOXEMETRY PANEL
Carboxyhemoglobin: 0.7 % (ref 0.5–1.5)
Methemoglobin: 1.1 % (ref 0.0–1.5)
O2 Saturation: 56.7 %
Total hemoglobin: 10.3 g/dL — ABNORMAL LOW (ref 12.0–16.0)

## 2021-02-12 LAB — CBC
HCT: 33.3 % — ABNORMAL LOW (ref 36.0–46.0)
Hemoglobin: 10.8 g/dL — ABNORMAL LOW (ref 12.0–15.0)
MCH: 29.8 pg (ref 26.0–34.0)
MCHC: 32.4 g/dL (ref 30.0–36.0)
MCV: 91.7 fL (ref 80.0–100.0)
Platelets: 275 10*3/uL (ref 150–400)
RBC: 3.63 MIL/uL — ABNORMAL LOW (ref 3.87–5.11)
RDW: 15.5 % (ref 11.5–15.5)
WBC: 9.4 10*3/uL (ref 4.0–10.5)
nRBC: 0 % (ref 0.0–0.2)

## 2021-02-12 LAB — FOLATE: Folate: 10.8 ng/mL (ref >5.9)

## 2021-02-12 LAB — GLUCOSE, CAPILLARY
Glucose-Capillary: 96 mg/dL (ref 70–99)
Glucose-Capillary: 187 mg/dL — ABNORMAL HIGH (ref 70–99)
Glucose-Capillary: 120 mg/dL — ABNORMAL HIGH (ref 70–99)
Glucose-Capillary: 152 mg/dL — ABNORMAL HIGH (ref 70–99)

## 2021-02-12 LAB — IRON AND TIBC
Iron: 55 ug/dL (ref 28–170)
Saturation Ratios: 17 % (ref 10.4–31.8)
TIBC: 325 ug/dL (ref 250–450)
UIBC: 270 ug/dL

## 2021-02-12 LAB — VITAMIN B12: Vitamin B-12: 228 pg/mL (ref 180–914)

## 2021-02-12 LAB — HEMOGLOBIN A1C
Hgb A1c MFr Bld: 7.1 % — ABNORMAL HIGH (ref 4.8–5.6)
Mean Plasma Glucose: 157.07 mg/dL

## 2021-02-12 LAB — OCCULT BLOOD X 1 CARD TO LAB, STOOL: Fecal Occult Bld: NEGATIVE

## 2021-02-12 LAB — FERRITIN: Ferritin: 233 ng/mL (ref 11–307)

## 2021-02-12 LAB — MRSA PCR SCREENING: MRSA by PCR: NEGATIVE

## 2021-02-12 MED ORDER — NOREPINEPHRINE 4 MG/250ML-% IV SOLN
INTRAVENOUS | Status: AC
  Administered 2021-02-12: 2 ug/min via INTRAVENOUS
  Filled 2021-02-12: qty 250

## 2021-02-12 MED ORDER — ATROPINE SULFATE 1 MG/10ML IJ SOSY
PREFILLED_SYRINGE | INTRAMUSCULAR | Status: AC
  Filled 2021-02-12: qty 10

## 2021-02-12 MED ORDER — NOREPINEPHRINE 4 MG/250ML-% IV SOLN
0.0000 ug/min | INTRAVENOUS | Status: DC
  Filled 2021-02-12: qty 250

## 2021-02-12 MED ORDER — HEPARIN (PORCINE) 25000 UT/250ML-% IV SOLN
1100.0000 [IU]/h | INTRAVENOUS | Status: DC
  Administered 2021-02-12: 1100 [IU]/h via INTRAVENOUS
  Filled 2021-02-12: qty 250

## 2021-02-12 MED ORDER — AMIODARONE HCL IN DEXTROSE 360-4.14 MG/200ML-% IV SOLN
60.0000 mg/h | INTRAVENOUS | Status: DC
  Administered 2021-02-12 (×2): 60 mg/h via INTRAVENOUS
  Filled 2021-02-12: qty 200

## 2021-02-12 MED ORDER — SODIUM CHLORIDE 0.9% FLUSH
10.0000 mL | Freq: Two times a day (BID) | INTRAVENOUS | Status: DC
  Administered 2021-02-12 – 2021-02-19 (×13): 10 mL

## 2021-02-12 MED ORDER — SODIUM CHLORIDE 0.9 % IV BOLUS
500.0000 mL | Freq: Once | INTRAVENOUS | Status: AC
  Administered 2021-02-12: 500 mL via INTRAVENOUS

## 2021-02-12 MED ORDER — MAGNESIUM SULFATE 2 GM/50ML IV SOLN
2.0000 g | Freq: Once | INTRAVENOUS | Status: AC
  Administered 2021-02-12: 2 g via INTRAVENOUS
  Filled 2021-02-12: qty 50

## 2021-02-12 MED ORDER — AMIODARONE HCL IN DEXTROSE 360-4.14 MG/200ML-% IV SOLN
30.0000 mg/h | INTRAVENOUS | Status: DC
  Filled 2021-02-12: qty 200

## 2021-02-12 MED ORDER — FUROSEMIDE 10 MG/ML IJ SOLN
40.0000 mg | Freq: Once | INTRAMUSCULAR | Status: AC
  Administered 2021-02-12: 40 mg via INTRAVENOUS
  Filled 2021-02-12: qty 4

## 2021-02-12 MED ORDER — DOPAMINE-DEXTROSE 3.2-5 MG/ML-% IV SOLN
INTRAVENOUS | Status: AC
  Filled 2021-02-12: qty 250

## 2021-02-12 MED ORDER — AMIODARONE LOAD VIA INFUSION
150.0000 mg | Freq: Once | INTRAVENOUS | Status: AC
  Administered 2021-02-12: 150 mg via INTRAVENOUS
  Filled 2021-02-12: qty 83.34

## 2021-02-12 MED ORDER — SODIUM CHLORIDE 0.9% FLUSH
10.0000 mL | INTRAVENOUS | Status: DC | PRN
  Administered 2021-02-12 – 2021-02-19 (×2): 10 mL

## 2021-02-12 NOTE — Consult Note (Addendum)
Cardiology Consultation:   Patient ID: Michelle Morrison MRN: [id number]; DOB: [birth year]  Admit date: 02/11/2021 Date of Consult: 02/12/2021  PCP:  Michelle Morrison, [place]  Cardiologist:  Michelle Dolly, MD  Advanced Practice Provider:  No care team member to display Electrophysiologist:  None  [phone]    Patient Profile:   Michelle Morrison is a 60 y.o. female with a hx of  NICM LVEF 45%, PSVT with post conversion pauses up to 1.4 sec, NSVT who is being seen today for the evaluation of atrial arrhythmias at the request of Dr. Wynetta Morrison.  History of Present Illness:   Michelle Morrison is a 59 yo female patient with history of NICM LVEF 45%,normal cath 03/5020 HTN, HLD, PSVT monitor 04/2020 with atrial tachycardia with aberrancy up to 30 seconds followed by postcoversion pauses up to 1.4 sec and 5 beat NSVT.  Patient comes in yesterday with abdominal pain and swelling. In ER was having runs of SVT the the notes say coincided with her abdominal pain. She says she didn't come in for her heart. She gets very short of breath going up 13 stairs to her apartment and has to stop. When she gets to the top she gets a little dizzy and sits down. She denies palpitations, chest pain, syncope. K 3.4 BNP 1760   Past Medical History:  Diagnosis Date  . CHF (congestive heart failure) ([place])    a. EF 45% by echo in 03/2020 with normal cors by cath  . Diabetes mellitus without complication ([place])   . Diarrhea   . Herpes simplex infection   . Hyperlipidemia   . Hypertension     Past Surgical History:  Procedure Laterality Date  . CESAREAN SECTION    . CHOLECYSTECTOMY    . COLONOSCOPY N/A 08/26/2014   Procedure: COLONOSCOPY;  Surgeon: [name], MD;  Location: [facility];  Service: Endoscopy;  Laterality: N/A;  9:30 AM - moved to 8:30 - Ginger to notify pt  . LEFT HEART CATH AND CORONARY ANGIOGRAPHY N/A 04/14/2020   Procedure: LEFT HEART CATH AND CORONARY  ANGIOGRAPHY;  Surgeon: [name], [name], MD;  Location: [facility];  Service: Cardiovascular;  Laterality: N/A;     Home Medications:  Prior to Admission medications   Medication Sig Start Date End Date Taking? Authorizing Provider  albuterol (VENTOLIN HFA) 108 (90 Base) MCG/ACT inhaler Inhale 1-2 puffs into the lungs every 4 (four) hours as needed. 05/30/20  Yes [provider]  aspirin 81 MG chewable tablet Chew 1 tablet (81 mg total) by mouth daily. 04/16/20  Yes Hosie Poisson, MD  atorvastatin (LIPITOR) 80 MG tablet Take 80 mg by mouth daily. 04/15/20  Yes [provider]  carvedilol (COREG) 6.25 MG tablet Take 1 tablet (6.25 mg total) by mouth 2 (two) times daily with a meal. 06/10/20  Yes Rai, Ripudeep K, MD  furosemide (LASIX) 40 MG tablet Take 1 tablet (40 mg total) by mouth daily. 06/11/20  Yes Rai, Ripudeep K, MD  ibuprofen (ADVIL) 600 MG tablet Take 600 mg by mouth every 6 (six) hours as needed. 01/16/21  Yes [provider]  losartan (COZAAR) 100 MG tablet Take 1 tablet (100 mg total) by mouth daily. 08/07/20 11/05/20 Yes Branch, Alphonse Guild, MD  TRADJENTA 5 MG TABS tablet Take 5 mg by mouth daily.  07/28/16  Yes [provider]  phenazopyridine (PYRIDIUM) 200 MG tablet Take 1 tablet (200 mg total) by mouth  3 (three) times daily as needed for pain. Patient not taking: No sig reported 01/04/21   Cresenzo-Dishmon, Joaquim Lai, CNM  sulfamethoxazole-trimethoprim (BACTRIM DS) 800-160 MG tablet Take 1 tablet by mouth 2 (two) times daily. Patient not taking: No sig reported 01/04/21   Christin Fudge, CNM    Inpatient Medications: Scheduled Meds: . apixaban  5 mg Oral BID  . atorvastatin  80 mg Oral Daily  . Chlorhexidine Gluconate Cloth  6 each Topical Daily  . furosemide  40 mg Intravenous Q12H  . insulin aspart  0-5 Units Subcutaneous QHS  . insulin aspart  0-9 Units Subcutaneous TID WC  . losartan  100 mg Oral Daily  . pantoprazole  40 mg  Oral Daily  . sodium chloride flush  3 mL Intravenous Q12H  . sodium chloride flush  3 mL Intravenous Q12H   Continuous Infusions: . sodium chloride    . diltiazem (CARDIZEM) infusion 5 mg/hr (02/12/21 0602)   PRN Meds: sodium chloride, acetaminophen **OR** acetaminophen, albuterol, bisacodyl, ondansetron **OR** ondansetron (ZOFRAN) IV, polyethylene glycol, sodium chloride flush, traZODone  Allergies:   No Known Allergies  Social History:   Social History   Socioeconomic History  . Marital status: Single    Spouse name: Not on file  . Number of children: Not on file  . Years of education: Not on file  . Highest education level: Not on file  Occupational History  . Not on file  Tobacco Use  . Smoking status: Never Smoker  . Smokeless tobacco: Never Used  Vaping Use  . Vaping Use: Never used  Substance and Sexual Activity  . Alcohol use: No    Alcohol/week: 0.0 standard drinks  . Drug use: No  . Sexual activity: Not Currently    Birth control/protection: Post-menopausal  Other Topics Concern  . Not on file  Social History Narrative  . Not on file   Social Determinants of Health   Financial Resource Strain: Not on file  Food Insecurity: Not on file  Transportation Needs: No Transportation Needs  . Lack of Transportation (Medical): No  . Lack of Transportation (Non-Medical): No  Physical Activity: Not on file  Stress: Not on file  Social Connections: Not on file  Intimate Partner Violence: Not on file    Family History:     Family History  Problem Relation Age of Onset  . Hypertension Mother   . Diabetes Mother   . Colon cancer Neg Hx      ROS:  Please see the history of present illness.  Review of Systems  Constitutional: Negative.  HENT: Negative.   Eyes: Negative.   Cardiovascular: Positive for dyspnea on exertion.  Respiratory: Negative.   Hematologic/Lymphatic: Negative.   Musculoskeletal: Negative.  Negative for joint pain.  Gastrointestinal:  Positive for bloating and abdominal pain.  Genitourinary: Negative.   Neurological: Positive for dizziness.    All other ROS reviewed and negative.     Physical Exam/Data:   Vitals:   02/12/21 0421 02/12/21 0500 02/12/21 0600 02/12/21 0718  BP:  (!) 151/100    Pulse:  (!) 128 (!) 44 (!) 56  Resp:  19 15 19   Temp: (!) 97.5 F (36.4 C)   (!) 97.4 F (36.3 C)  TempSrc: Oral   Oral  SpO2:  97% 91% 100%  Weight: 76.5 kg     Height:        Intake/Output Summary (Last 24 hours) at 02/12/2021 0835 Last data filed at 02/12/2021 0327 Gross per 24 hour  Intake 35.16 ml  Output -  Net 35.16 ml   Last 3 Weights 02/12/2021 02/11/2021 10/03/2020  Weight (lbs) 168 lb 10.4 oz 164 lb 173 lb 12.8 oz  Weight (kg) 76.5 kg 74.39 kg 78.835 kg     Body mass index is 28.07 kg/m.  General:  Well nourished, well developed, in no acute distress  HEENT: normal Lymph: no adenopathy Neck: no JVD Endocrine:  No thryomegaly Vascular: No carotid bruits; FA pulses 2+ bilaterally without bruits  Cardiac:  normal S1, S2; RRR; no murmur   Lungs:  clear to auscultation bilaterally, no wheezing, rhonchi or rales  Abd: soft, nontender, no hepatomegaly  Ext: no edema Musculoskeletal:  No deformities, BUE and BLE strength normal and equal Skin: warm and dry  Neuro:  CNs 2-12 intact, no focal abnormalities noted Psych:  Normal affect   EKG:  The EKG was personally reviewed and demonstrates:  Multiple EKG's most showing SVT Telemetry:  Telemetry was personally reviewed and demonstrates:  SVT with post conversion pauses, NSVT, a couple strips possibly Afib  Relevant CV Studies:  30 day event monitor 04/2020  Min HR 38, Max HR 163, Avg HR 72. Min HR occurred in early AM hours presumably while sleeping  Telemetry tracings show sinus rhythm and sinus bradycardia,several runs of atach with aberrancy up to 30 seconds.. Runs of atach followed by postconversion pauses up to 1.4 seconds.  5 beat run of NSVT  No  symptoms reported   Echo 05/24/20 IMPRESSIONS     1. Right ventricular systolic function is normal. The right ventricular  size is normal.   2. The anteroseptal wall is severely hypokinetic to akinetic.More  pronounced finding that would be seen with her LBBB alone. Left  ventricular ejection fraction, by estimation, is 45%. The left ventricle  has mildly decreased function. The left ventricle  demonstrates regional wall motion abnormalities (see scoring  diagram/findings for description). There is moderate left ventricular  hypertrophy. Left ventricular diastolic parameters are consistent with  Grade II diastolic dysfunction  (pseudonormalization).   3. Left atrial size was mildly dilated.   4. The mitral valve is normal in structure. No evidence of mitral valve  regurgitation. No evidence of mitral stenosis.   5. The aortic valve is tricuspid. Aortic valve regurgitation is not  visualized. No aortic stenosis is present.   6. The inferior vena cava is normal in size with greater than 50%  respiratory variability, suggesting right atrial pressure of 3 mmHg.   FINDINGS     Echo 5/20/2021IMPRESSIONS     1. Right ventricular systolic function is normal. The right ventricular  size is normal.   2. The anteroseptal wall is severely hypokinetic to akinetic.More  pronounced finding that would be seen with her LBBB alone. Left  ventricular ejection fraction, by estimation, is 45%. The left ventricle  has mildly decreased function. The left ventricle  demonstrates regional wall motion abnormalities (see scoring  diagram/findings for description). There is moderate left ventricular  hypertrophy. Left ventricular diastolic parameters are consistent with  Grade II diastolic dysfunction  (pseudonormalization).   3. Left atrial size was mildly dilated.   4. The mitral valve is normal in structure. No evidence of mitral valve  regurgitation. No evidence of mitral stenosis.   5. The  aortic valve is tricuspid. Aortic valve regurgitation is not  visualized. No aortic stenosis is present.   6. The inferior vena cava is normal in size with greater than 50%  respiratory variability, suggesting right  atrial pressure of 3 mmHg.     Cardiac cath 6/76/1950DT end diastolic pressure is normal.   1. Normal coronary anatomy.  2. Normal LVEDP 10 mm Hg   Plan: medical management with BP control. Anticipate DC today.           Laboratory Data:  High Sensitivity Troponin:   Recent Labs  Lab 02/11/21 1741  TROPONINIHS 22*     Chemistry Recent Labs  Lab 02/11/21 1741 02/12/21 0422  NA 138 138  K 3.4* 3.6  CL 98 98  CO2 29 28  GLUCOSE 128* 101*  BUN 18 18  CREATININE 1.04* 0.89  CALCIUM 8.3* 8.3*  GFRNONAA >60 >60  ANIONGAP 11 12    Recent Labs  Lab 02/11/21 1741  PROT 7.1  ALBUMIN 3.4*  AST 29  ALT 29  ALKPHOS 76  BILITOT 1.1   Hematology Recent Labs  Lab 02/11/21 1741 02/12/21 0422  WBC 9.1 9.4  RBC 3.76* 3.63*  HGB 11.3* 10.8*  HCT 34.6* 33.3*  MCV 92.0 91.7  MCH 30.1 29.8  MCHC 32.7 32.4  RDW 15.5 15.5  PLT 272 275   BNP Recent Labs  Lab 02/11/21 2130  BNP 1,760.0*    DDimer No results for input(s): DDIMER in the last 168 hours.   Radiology/Studies:  DG Abd 1 View  Result Date: 02/11/2021 CLINICAL DATA:  60 year old female with nausea and abdominal pain. EXAM: ABDOMEN - 1 VIEW COMPARISON:  None. FINDINGS: Support wires overlie the patient. There is no bowel dilatation or evidence of obstruction. No free air or radiopaque calculi. The osseous structures are intact. Degenerative changes of the spine. IMPRESSION: Negative. Electronically Signed   By: Anner Crete M.D.   On: 02/11/2021 21:03   DG Chest Port 1 View  Result Date: 02/11/2021 CLINICAL DATA:  Shortness of breath EXAM: PORTABLE CHEST 1 VIEW COMPARISON:  06/09/2020 FINDINGS: Cardiomegaly, vascular congestion. No overt edema. Bibasilar opacities, likely atelectasis. No  visible significant effusions or pneumothorax. No acute bony abnormality. IMPRESSION: Cardiomegaly, vascular congestion. Bibasilar atelectasis. Electronically Signed   By: Rolm Baptise M.D.   On: 02/11/2021 18:46     Assessment and Plan:   PSVT with post conversion pauses and question of some Afib/flutter now on IV diltiazem 5 mg. Chronic DOE possibly secondary to arrhythmias. Dr. Harl Bowie to review telemetry-a few strips possible Afib. Will most likely need EPS evaluation either inpatient or outpatient. Eliquis started. Will stop ASA  Acute on chronic systolic and diastolic CHF BNP [id number] with abdominal swelling/discomfort I/O's positive 35cc on lasix 40 mg IV BID  NICM LVEF 45% echo 04/2020 normal cath 03/2020  HTN BP low this am but was up.  HLD on lipitor   Risk Assessment/Risk Scores:        New York Heart Association (NYHA) Functional Class NYHA Class III  CHA2DS2-VASc Score = 4  This indicates a 4.8% annual risk of stroke. The patient's score is based upon: CHF History: Yes HTN History: Yes Diabetes History: Yes Stroke History: No Vascular Disease History: No Age Score: 0 Gender Score: 1       For questions or updates, please contact [place] Please consult [email or web address] for contact info under    Signed, Ermalinda Barrios, PA-C  02/12/2021 8:35 AM   Attending note Patient seen and discussed with PA Bonnell Public, I agree with her documentation above. 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 In ER found  to have runs of tachcyardia, cardiology consulted   Lipase 26 K 3.4 Cr 1.04 BUN 18 WBC 9.1 Hgb 11.3 Plt 272 TSH 3 Mg 1.7 BNP 1760 Trop 22--> COVID neg CXR cardiomegaly, vascular congestion EKG SVT, then Sr with chronic LBBB   Episodes of tachycardia this admission. Telemetry reviewed with varisous rhythms. At times sinus rhythm/sinus rhythm, ectopic atrial  rhythm, afib along with paroxysms of likely aflutter. Rate control has been difficult due to intermittent bradycardia. Currently on dilt gtt at 5 with tachy-brady episodes. Will try  IV amio to see if better able to control her tachy-brady episodes. She has been started on eliquis.    Acute on chronic combined systolic/diastolic HF. Significantly elevated BNP, CXR with vascular congestion. Exam does not appear that impressive for volume overload. She is on IV lasix 40mg  bid. Limited I/Os data thus far, downtrend in Cr with diuresis consistent with venous congestion and CHF. Continue IV diuresis. Will repeat echo, risk for tachy mediated CM  Michelle Dolly MD

## 2021-02-12 NOTE — Discharge Instructions (Signed)

## 2021-02-12 NOTE — Progress Notes (Signed)
*  PRELIMINARY RESULTS* Echocardiogram 2D Echocardiogram has been performed.  Michelle Morrison 02/12/2021, 3:12 PM

## 2021-02-12 NOTE — Progress Notes (Signed)
Peripherally Inserted Central Catheter Placement  The IV Nurse has discussed with the patient and/or persons authorized to consent for the patient, the purpose of this procedure and the potential benefits and risks involved with this procedure.  The benefits include less needle sticks, lab draws from the catheter, and the patient may be discharged home with the catheter. Risks include, but not limited to, infection, bleeding, blood clot (thrombus formation), and puncture of an artery; nerve damage and irregular heartbeat and possibility to perform a PICC exchange if needed/ordered by physician.  Alternatives to this procedure were also discussed.  Bard Power PICC patient education guide, fact sheet on infection prevention and patient information card has been provided to patient /or left at bedside.    PICC Placement Documentation  PICC Triple Lumen 02/12/21 PICC Right Brachial 37 cm 0 cm (Active)  Indication for Insertion or Continuance of Line Vasoactive infusions 02/12/21 1737  Exposed Catheter (cm) 0 cm 02/12/21 1737  Site Assessment Clean;Dry;Intact 02/12/21 1737  Lumen #1 Status Flushed;Saline locked;Blood return noted 02/12/21 1737  Lumen #2 Status Flushed;Saline locked;Blood return noted 02/12/21 1737  Lumen #3 Status Flushed;Saline locked;Blood return noted 02/12/21 1737  Dressing Type Transparent 02/12/21 1737  Dressing Status Clean;Dry;Intact 02/12/21 1737  Antimicrobial disc in place? Yes 02/12/21 1737  Dressing Intervention New dressing 02/12/21 1737  Dressing Change Due 02/19/21 02/12/21 1737       Gordan Payment 02/12/2021, 5:40 PM

## 2021-02-12 NOTE — Progress Notes (Addendum)
Patient with progressing hypotension in the afternoon, SBPs down to 70s. Bolused a total of 1L NS without significant improvement, amio stopped. Echo obtained which shows new findings of significant LV and RV systolic dysfunction. Hypotension likely related to biventricular dysfunction in setting of tachy-brady episodes. Peripheral levophed started, significant improvement in bp's on low dose, a PICC line is ordered and we will obtain COOX and CVP. Pending COOX/CVP/bp's would anticipate repeat attempt for diuresis on vasopressor support. Wither her tachy brady unclear if would tolerate intotrope if potentially indicated. Regarding her tachy brady amio now on hold, if recurrent significant tachy would try back low dose amio at 51m/hr, she ultimately may require a pacemaker.  Would look to get PICC line here today, f/u coox and CVP. Likely will need consideration for transfer to [facility] tomorrow for evaluation potentially by CHF team and EP.  Will stop eliquis, start hep gtt in case invasive procedures are indicated.    630pm addendum CVP 15, coox 57%. Borderline low coox, with her intermittent tachy would not start inotrope at this time, if further decline on AM coox may consider trial. With elevated CVP and now stable bp's on very low dose levophed dose IV lasix 40mg  now and follow diuresis.   Carlyle Dolly MD

## 2021-02-12 NOTE — Progress Notes (Signed)
ANTICOAGULATION CONSULT NOTE - Initial Consult  Pharmacy Consult for heparin Indication: atrial fibrillation  No Known Allergies  Patient Measurements: Height: 5\' 5"  (165.1 cm) Weight: 76.5 kg (168 lb 10.4 oz) IBW/kg (Calculated) : 57 Heparin Dosing Weight: 72 kg  Vital Signs: Temp: 95.5 F (35.3 C) (03/21 1610) Temp Source: Rectal (03/21 1610) BP: 127/49 (03/21 1500) Pulse Rate: 113 (03/21 1500)  Labs: Recent Labs    02/11/21 1741 02/12/21 0422  HGB 11.3* 10.8*  HCT 34.6* 33.3*  PLT 272 275  CREATININE 1.04* 0.89  TROPONINIHS 22*  --     Estimated Creatinine Clearance: 70.5 mL/min (by C-G formula based on SCr of 0.89 mg/dL).   Medical History: Past Medical History:  Diagnosis Date  . CHF (congestive heart failure) ([place])    a. EF 45% by echo in 03/2020 with normal cors by cath  . Diabetes mellitus without complication ([place])   . Diarrhea   . Herpes simplex infection   . Hyperlipidemia   . Hypertension     Medications:  Medications Prior to Admission  Medication Sig Dispense Refill Last Dose  . albuterol (VENTOLIN HFA) 108 (90 Base) MCG/ACT inhaler Inhale 1-2 puffs into the lungs every 4 (four) hours as needed.   02/11/2021 at Unknown time  . aspirin 81 MG chewable tablet Chew 1 tablet (81 mg total) by mouth daily. 30 tablet 1 02/11/2021 at Unknown time  . atorvastatin (LIPITOR) 80 MG tablet Take 80 mg by mouth daily.   02/11/2021 at Unknown time  . carvedilol (COREG) 6.25 MG tablet Take 1 tablet (6.25 mg total) by mouth 2 (two) times daily with a meal. 60 tablet 3 02/11/2021 at 1300  . furosemide (LASIX) 40 MG tablet Take 1 tablet (40 mg total) by mouth daily. 30 tablet 3 02/11/2021 at Unknown time  . ibuprofen (ADVIL) 600 MG tablet Take 600 mg by mouth every 6 (six) hours as needed.     Marland Kitchen losartan (COZAAR) 100 MG tablet Take 1 tablet (100 mg total) by mouth daily. 90 tablet 3 02/11/2021 at Unknown time  . TRADJENTA 5 MG TABS tablet Take 5 mg by mouth daily.     02/11/2021 at Unknown time  . phenazopyridine (PYRIDIUM) 200 MG tablet Take 1 tablet (200 mg total) by mouth 3 (three) times daily as needed for pain. (Patient not taking: No sig reported) 10 tablet 0 Not Taking at Unknown time  . sulfamethoxazole-trimethoprim (BACTRIM DS) 800-160 MG tablet Take 1 tablet by mouth 2 (two) times daily. (Patient not taking: No sig reported) 10 tablet 0 Completed Course at Unknown time    Assessment: Pharmacy consulted to dose heparin on patient with atrial fibrillation. Patient started on Eliquis with last dose given 3/21 @ 0930.  Will need to monitor based on aPTT until correlation with heparin levels.  Goal of Therapy:  Heparin level 0.3-0.7 units/ml aPTT 66-102 seconds Monitor platelets by anticoagulation protocol: Yes   Plan:  Start heparin infusion @ 2200 Start heparin infusion at 1100 units/hr Check anti-Xa level in 6 hours and daily while on heparin Continue to monitor H&H and platelets  Margot Ables, PharmD Clinical Pharmacist 02/12/2021 4:22 PM

## 2021-02-12 NOTE — Progress Notes (Signed)
PROGRESS NOTE   Michelle Morrison  PVV:[id number] DOB: July 28, 1961 DOA: 02/11/2021 PCP: Rosita Fire, MD   Chief Complaint  Patient presents with  . Abdominal Pain   Level of care: Stepdown  Brief Admission History:  60 year old female with chronic combined systolic and diastolic heart failure with an EF of 45% and grade 2 diastolic dysfunction presented to the emergency department with complaints of abdominal pain and noted to have multiple atrial tachycardias and aberrant conduction presented on EKGs.  She was hypokalemic.  Initially treated with metoprolol and after ED discussion with on-call cardiologist treated with IV Cardizem for rate control she has been having intermittent episodes of tachybradycardia.  She also had findings of vascular congestion and markedly elevated BNP.  Assessment & Plan:   Principal Problem:   Paroxysmal A-fib/Flutter(HCC) Active Problems:   HTN (hypertension), benign   Acute on chronic combined systolic and diastolic CHF (congestive heart failure) (HCC)   Diabetes mellitus type 2 with complications (HCC)   Atrial fibrillation with RVR (HCC)   Atrial tachycardia -Patient has been maintained on IV Cardizem infusion but having episodes of bradycardia.  Cardiology is seeing her today and starting her on IV amiodarone infusion.  She has been started on full anticoagulation with apixaban.  Electrolytes are being addressed.  Carvedilol was discontinued due to bradycardia. -Urine toxicology screen negative  Hypokalemia -This is been repleted. -Follow daily BMP.  Also check magnesium.  Cardiomyopathy -2D echo ordered today by cardiology team to reassess systolic and diastolic function her most recent echo showed grade 2 diastolic dysfunction and LVEF 45% with anterolateral wall hypokinesis  Essential hypertension -Blood pressures better controlled we will continue to follow closely  Type 2 diabetes mellitus uncontrolled as evidenced by hemoglobin A1c of  7.3%.  This was an old A1c.  We will check a new A1c test for most recent blood glucose readings. -Patient remains on SSI coverage and frequent CBG testing.  Normocytic anemia -Iron panel came back within normal limits -Follow CBC   DVT prophylaxis: Apixaban Code Status: Full Family Communication:  Disposition: Home  Status is: Observation  The patient will require care spanning > 2 midnights and should be moved to inpatient because: IV treatments appropriate due to intensity of illness or inability to take PO and Inpatient level of care appropriate due to severity of illness  Dispo: The patient is from: Home              Anticipated d/c is to: Home              Patient currently is not medically stable to d/c.   Difficult to place patient No   Consultants:   cardiology  Procedures:     Antimicrobials:     Subjective: Pt says that she is already starting to feel better, tolerated breakfast with no abdominal pain, no CP, no SOB.   Objective: Vitals:   02/12/21 0421 02/12/21 0500 02/12/21 0600 02/12/21 0718  BP:  (!) 151/100    Pulse:  (!) 128 (!) 44 (!) 56  Resp:  19 15 19   Temp: (!) 97.5 F (36.4 C)   (!) 97.4 F (36.3 C)  TempSrc: Oral   Oral  SpO2:  97% 91% 100%  Weight: 76.5 kg     Height:        Intake/Output Summary (Last 24 hours) at 02/12/2021 1056 Last data filed at 02/12/2021 0847 Gross per 24 hour  Intake 35.16 ml  Output 200 ml  Net -164.84 ml  Filed Weights   02/11/21 1737 02/12/21 0421  Weight: 74.4 kg 76.5 kg    Examination:  General exam: sitting up in bed, awake, alert, Appears calm and comfortable  Respiratory system: no increased work of breathing. Cardiovascular system: normal S1 & S2 heard. Irregularly irregular.  No JVD, murmurs, rubs, gallops or clicks. trace pedal edema. Gastrointestinal system: Abdomen is nondistended, soft and nontender. No organomegaly or masses felt. Normal bowel sounds heard. Central nervous system: Alert  and oriented. No focal neurological deficits. Extremities: Symmetric 5 x 5 power. Skin: No rashes, lesions or ulcers Psychiatry: Judgement and insight appear normal. Mood & affect appropriate.   Data Reviewed: I have personally reviewed following labs and imaging studies  CBC: Recent Labs  Lab 02/11/21 1741 02/12/21 0422  WBC 9.1 9.4  HGB 11.3* 10.8*  HCT 34.6* 33.3*  MCV 92.0 91.7  PLT 272 973    Basic Metabolic Panel: Recent Labs  Lab 02/11/21 1741 02/11/21 1757 02/12/21 0422  NA 138  --  138  K 3.4*  --  3.6  CL 98  --  98  CO2 29  --  28  GLUCOSE 128*  --  101*  BUN 18  --  18  CREATININE 1.04*  --  0.89  CALCIUM 8.3*  --  8.3*  MG  --  1.7  --     GFR: Estimated Creatinine Clearance: 70.5 mL/min (by C-G formula based on SCr of 0.89 mg/dL).  Liver Function Tests: Recent Labs  Lab 02/11/21 1741  AST 29  ALT 29  ALKPHOS 76  BILITOT 1.1  PROT 7.1  ALBUMIN 3.4*    CBG: Recent Labs  Lab 02/11/21 2239 02/12/21 0718  GLUCAP 120* 96    Recent Results (from the past 240 hour(s))  Resp Panel by RT-PCR (Flu A&B, Covid) Nasopharyngeal Swab     Status: None   Collection Time: 02/11/21  7:55 PM   Specimen: Nasopharyngeal Swab; Nasopharyngeal(NP) swabs in vial transport medium  Result Value Ref Range Status   SARS Coronavirus 2 by RT PCR NEGATIVE NEGATIVE Final    Comment: (NOTE) SARS-CoV-2 target nucleic acids are NOT DETECTED.  The SARS-CoV-2 RNA is generally detectable in upper respiratory specimens during the acute phase of infection. The lowest concentration of SARS-CoV-2 viral copies this assay can detect is 138 copies/mL. A negative result does not preclude SARS-Cov-2 infection and should not be used as the sole basis for treatment or other patient management decisions. A negative result may occur with  improper specimen collection/handling, submission of specimen other than nasopharyngeal swab, presence of viral mutation(s) within the areas  targeted by this assay, and inadequate number of viral copies(<138 copies/mL). A negative result must be combined with clinical observations, patient history, and epidemiological information. The expected result is Negative.  Fact Sheet for Patients:  EntrepreneurPulse.com.au  Fact Sheet for Healthcare Providers:  IncredibleEmployment.be  This test is no t yet approved or cleared by the [place] FDA and  has been authorized for detection and/or diagnosis of SARS-CoV-2 by FDA under an Emergency Use Authorization (EUA). This EUA will remain  in effect (meaning this test can be used) for the duration of the COVID-19 declaration under Section 564(b)(1) of the Act, 21 U.S.C.section 360bbb-3(b)(1), unless the authorization is terminated  or revoked sooner.       Influenza A by PCR NEGATIVE NEGATIVE Final   Influenza B by PCR NEGATIVE NEGATIVE Final    Comment: (NOTE) The Xpert Xpress SARS-CoV-2/FLU/RSV plus assay is  intended as an aid in the diagnosis of influenza from Nasopharyngeal swab specimens and should not be used as a sole basis for treatment. Nasal washings and aspirates are unacceptable for Xpert Xpress SARS-CoV-2/FLU/RSV testing.  Fact Sheet for Patients: EntrepreneurPulse.com.au  Fact Sheet for Healthcare Providers: IncredibleEmployment.be  This test is not yet approved or cleared by the [place] FDA and has been authorized for detection and/or diagnosis of SARS-CoV-2 by FDA under an Emergency Use Authorization (EUA). This EUA will remain in effect (meaning this test can be used) for the duration of the COVID-19 declaration under Section 564(b)(1) of the Act, 21 U.S.C. section 360bbb-3(b)(1), unless the authorization is terminated or revoked.  Performed at [facility], [address] [place], [place] [address]   MRSA PCR Screening     Status: None   Collection Time: 02/11/21  9:58 PM    Specimen: Nasal Mucosa; Nasopharyngeal  Result Value Ref Range Status   MRSA by PCR NEGATIVE NEGATIVE Final    Comment:        The GeneXpert MRSA Assay (FDA approved for NASAL specimens only), is one component of a comprehensive MRSA colonization surveillance program. It is not intended to diagnose MRSA infection nor to guide or monitor treatment for MRSA infections. Performed at [facility], [address] [place], [place] [address]      Radiology Studies: DG Abd 1 View  Result Date: 02/11/2021 CLINICAL DATA:  60 year old female with nausea and abdominal pain. EXAM: ABDOMEN - 1 VIEW COMPARISON:  None. FINDINGS: Support wires overlie the patient. There is no bowel dilatation or evidence of obstruction. No free air or radiopaque calculi. The osseous structures are intact. Degenerative changes of the spine. IMPRESSION: Negative. Electronically Signed   By: Anner Crete M.D.   On: 02/11/2021 21:03   DG Chest Port 1 View  Result Date: 02/11/2021 CLINICAL DATA:  Shortness of breath EXAM: PORTABLE CHEST 1 VIEW COMPARISON:  06/09/2020 FINDINGS: Cardiomegaly, vascular congestion. No overt edema. Bibasilar opacities, likely atelectasis. No visible significant effusions or pneumothorax. No acute bony abnormality. IMPRESSION: Cardiomegaly, vascular congestion. Bibasilar atelectasis. Electronically Signed   By: Rolm Baptise M.D.   On: 02/11/2021 18:46    Scheduled Meds: . apixaban  5 mg Oral BID  . atorvastatin  80 mg Oral Daily  . Chlorhexidine Gluconate Cloth  6 each Topical Daily  . furosemide  40 mg Intravenous Q12H  . insulin aspart  0-5 Units Subcutaneous QHS  . insulin aspart  0-9 Units Subcutaneous TID WC  . losartan  100 mg Oral Daily  . pantoprazole  40 mg Oral Daily  . sodium chloride flush  3 mL Intravenous Q12H  . sodium chloride flush  3 mL Intravenous Q12H   Continuous Infusions: . sodium chloride    . amiodarone 60 mg/hr (02/12/21 1008)   Followed by  .  amiodarone       LOS: 0 days   Time spent: 35 mins   Yanelli Zapanta Wynetta Emery, MD How to contact the [facility] Attending or Consulting provider [place] or covering provider during after hours [place], for this patient?  1. Check the care team in [facility] and look for a) attending/consulting [facility] provider listed and b) the [facility] team listed 2. Log into [email or web address] and use [place] universal password to access. If you do not have the password, please contact the hospital operator. 3. Locate the [facility] provider you are looking for under [facility] and page to a number that you can be  directly reached. 4. If you still have difficulty reaching the provider, please page the [facility] (Director on Call) for the Hospitalists listed on amion for assistance.  02/12/2021, 10:56 AM

## 2021-02-12 NOTE — Progress Notes (Signed)
Cross-coverage note:   Called regarding transient and asymptomatic drops in HR to 30s. Potassium was replaced last night and TSH was normal. Plan to hold Coreg, supplement magnesium, continue cardiac monitoring.

## 2021-02-13 ENCOUNTER — Other Ambulatory Visit: Payer: Medicare Other | Admitting: Obstetrics & Gynecology

## 2021-02-13 LAB — BASIC METABOLIC PANEL
Anion gap: 11 (ref 5–15)
Anion gap: 11 (ref 5–15)
BUN: 27 mg/dL — ABNORMAL HIGH (ref 6–20)
BUN: 30 mg/dL — ABNORMAL HIGH (ref 6–20)
CO2: 26 mmol/L (ref 22–32)
CO2: 25 mmol/L (ref 22–32)
Calcium: 8 mg/dL — ABNORMAL LOW (ref 8.9–10.3)
Calcium: 8.2 mg/dL — ABNORMAL LOW (ref 8.9–10.3)
Chloride: 100 mmol/L (ref 98–111)
Chloride: 100 mmol/L (ref 98–111)
Creatinine, Ser: 1.91 mg/dL — ABNORMAL HIGH (ref 0.44–1.00)
Creatinine, Ser: 2.21 mg/dL — ABNORMAL HIGH (ref 0.44–1.00)
GFR, Estimated: 30 mL/min — ABNORMAL LOW (ref >60)
GFR, Estimated: 25 mL/min — ABNORMAL LOW (ref >60)
Glucose, Bld: 111 mg/dL — ABNORMAL HIGH (ref 70–99)
Glucose, Bld: 170 mg/dL — ABNORMAL HIGH (ref 70–99)
Potassium: 3.6 mmol/L (ref 3.5–5.1)
Potassium: 3.3 mmol/L — ABNORMAL LOW (ref 3.5–5.1)
Sodium: 137 mmol/L (ref 135–145)
Sodium: 136 mmol/L (ref 135–145)

## 2021-02-13 LAB — CBC WITH DIFFERENTIAL/PLATELET
Abs Immature Granulocytes: 0.04 10*3/uL (ref 0.00–0.07)
Basophils Absolute: 0 10*3/uL (ref 0.0–0.1)
Basophils Relative: 0 %
Eosinophils Absolute: 0.1 10*3/uL (ref 0.0–0.5)
Eosinophils Relative: 1 %
HCT: 29.9 % — ABNORMAL LOW (ref 36.0–46.0)
Hemoglobin: 9.7 g/dL — ABNORMAL LOW (ref 12.0–15.0)
Immature Granulocytes: 0 %
Lymphocytes Relative: 16 %
Lymphs Abs: 1.7 10*3/uL (ref 0.7–4.0)
MCH: 29.8 pg (ref 26.0–34.0)
MCHC: 32.4 g/dL (ref 30.0–36.0)
MCV: 92 fL (ref 80.0–100.0)
Monocytes Absolute: 0.7 10*3/uL (ref 0.1–1.0)
Monocytes Relative: 6 %
Neutro Abs: 8.1 10*3/uL — ABNORMAL HIGH (ref 1.7–7.7)
Neutrophils Relative %: 77 %
Platelets: 292 10*3/uL (ref 150–400)
RBC: 3.25 MIL/uL — ABNORMAL LOW (ref 3.87–5.11)
RDW: 15.7 % — ABNORMAL HIGH (ref 11.5–15.5)
WBC: 10.5 10*3/uL (ref 4.0–10.5)
nRBC: 0 % (ref 0.0–0.2)

## 2021-02-13 LAB — GLUCOSE, CAPILLARY
Glucose-Capillary: 90 mg/dL (ref 70–99)
Glucose-Capillary: 180 mg/dL — ABNORMAL HIGH (ref 70–99)
Glucose-Capillary: 117 mg/dL — ABNORMAL HIGH (ref 70–99)
Glucose-Capillary: 141 mg/dL — ABNORMAL HIGH (ref 70–99)

## 2021-02-13 LAB — MAGNESIUM
Magnesium: 1.9 mg/dL (ref 1.7–2.4)
Magnesium: 2 mg/dL (ref 1.7–2.4)

## 2021-02-13 LAB — HEPARIN LEVEL (UNFRACTIONATED): Heparin Unfractionated: >2.2 IU/mL — ABNORMAL HIGH (ref 0.30–0.70)

## 2021-02-13 LAB — COOXEMETRY PANEL
Carboxyhemoglobin: 0.7 % (ref 0.5–1.5)
Carboxyhemoglobin: 0.7 % (ref 0.5–1.5)
Methemoglobin: 1.2 % (ref 0.0–1.5)
Methemoglobin: 1.2 % (ref 0.0–1.5)
O2 Saturation: 70.3 %
O2 Saturation: 56.9 %
Total hemoglobin: 10.4 g/dL — ABNORMAL LOW (ref 12.0–16.0)
Total hemoglobin: 9.9 g/dL — ABNORMAL LOW (ref 12.0–16.0)

## 2021-02-13 LAB — APTT
aPTT: 188 seconds (ref 24–36)
aPTT: >200 seconds (ref 24–36)
aPTT: 28 seconds (ref 24–36)

## 2021-02-13 MED ORDER — HEPARIN (PORCINE) 25000 UT/250ML-% IV SOLN
1100.0000 [IU]/h | INTRAVENOUS | Status: DC
  Administered 2021-02-13: 900 [IU]/h via INTRAVENOUS
  Filled 2021-02-13: qty 250

## 2021-02-13 MED ORDER — POTASSIUM CHLORIDE CRYS ER 20 MEQ PO TBCR
20.0000 meq | EXTENDED_RELEASE_TABLET | Freq: Once | ORAL | Status: AC
  Administered 2021-02-13: 20 meq via ORAL
  Filled 2021-02-13: qty 1

## 2021-02-13 MED ORDER — AMIODARONE HCL IN DEXTROSE 360-4.14 MG/200ML-% IV SOLN
30.0000 mg/h | INTRAVENOUS | Status: AC
  Administered 2021-02-13: 30 mg/h via INTRAVENOUS
  Filled 2021-02-13: qty 200

## 2021-02-13 MED ORDER — SODIUM CHLORIDE 0.9 % IV BOLUS
250.0000 mL | Freq: Once | INTRAVENOUS | Status: AC
  Administered 2021-02-13: 250 mL via INTRAVENOUS

## 2021-02-13 MED ORDER — ALBUMIN HUMAN 25 % IV SOLN
12.5000 g | Freq: Once | INTRAVENOUS | Status: AC
  Administered 2021-02-13: 12.5 g via INTRAVENOUS
  Filled 2021-02-13: qty 50

## 2021-02-13 MED ORDER — AMIODARONE HCL IN DEXTROSE 360-4.14 MG/200ML-% IV SOLN
30.0000 mg/h | INTRAVENOUS | Status: DC
  Administered 2021-02-13 – 2021-02-17 (×7): 30 mg/h via INTRAVENOUS
  Filled 2021-02-13 (×8): qty 200

## 2021-02-13 MED ORDER — MAGNESIUM SULFATE 2 GM/50ML IV SOLN
2.0000 g | Freq: Once | INTRAVENOUS | Status: AC
  Administered 2021-02-13: 2 g via INTRAVENOUS
  Filled 2021-02-13: qty 50

## 2021-02-13 NOTE — Progress Notes (Signed)
Progress Note  Patient Name: Michelle Morrison Date of Encounter: 02/13/2021  Primary Cardiologist: Carlyle Dolly, MD   Subjective   Developed significant bradycardia and hypotension yesterday afternoon which led to discontinuation of Amiodarone and initiation of Levophed. Levophed weaned this AM and HR in the 140's to 150's. Not currently on any AV nodal blocking agents.   She reports her breathing has improved. No chest pain or palpitations. Unaware of her arrhythmia. Having episodes of hematuria overnight and this AM.   Inpatient Medications    Scheduled Meds: . atorvastatin  80 mg Oral Daily  . Chlorhexidine Gluconate Cloth  6 each Topical Daily  . insulin aspart  0-5 Units Subcutaneous QHS  . insulin aspart  0-9 Units Subcutaneous TID WC  . pantoprazole  40 mg Oral Daily  . sodium chloride flush  10-40 mL Intracatheter Q12H  . sodium chloride flush  3 mL Intravenous Q12H  . sodium chloride flush  3 mL Intravenous Q12H   Continuous Infusions: . sodium chloride    . amiodarone    . amiodarone 30 mg/hr (02/13/21 1212)  . heparin 900 Units/hr (02/13/21 1000)  . magnesium sulfate bolus IVPB    . norepinephrine (LEVOPHED) Adult infusion Stopped (02/13/21 1025)   PRN Meds: sodium chloride, acetaminophen **OR** acetaminophen, albuterol, bisacodyl, ondansetron **OR** ondansetron (ZOFRAN) IV, polyethylene glycol, sodium chloride flush, sodium chloride flush, traZODone   Vital Signs    Vitals:   02/13/21 0945 02/13/21 1000 02/13/21 1015 02/13/21 1030  BP: (!) 161/112 132/80 (!) 156/105 (!) 147/87  Pulse: (!) 150 (!) 139 (!) 139 (!) 138  Resp: (!) 21 (!) 21 (!) 22 (!) 29  Temp:      TempSrc:      SpO2: 98% 100% 100% 100%  Weight:      Height:        Intake/Output Summary (Last 24 hours) at 02/13/2021 1229 Last data filed at 02/12/2021 2252 Gross per 24 hour  Intake 88.39 ml  Output 200 ml  Net -111.61 ml    Last 3 Weights 02/12/2021 02/11/2021 10/03/2020  Weight  (lbs) 168 lb 10.4 oz 164 lb 173 lb 12.8 oz  Weight (kg) 76.5 kg 74.39 kg 78.835 kg      Telemetry    Sinus bradycardia into the 40's overnight. In atrial flutter/fibrillation this AM with HR in the 140's to 150's.  - Personally Reviewed  ECG    No new tracings.   Physical Exam   General: Well developed, well nourished, female appearing in no acute distress. Head: Normocephalic, atraumatic.  Neck: Supple without bruits, JVD at 9 cm Lungs:  Resp regular and unlabored, decreased breath sounds along bases bilaterally Heart: Irregularly, irregular. Tachycardiac.  Abdomen: Soft, non-tender. Appears distended.  Extremities: No clubbing  cyanosis, or edema. Distal pedal pulses are 2+ bilaterally. Neuro: Alert and oriented X 3. Moves all extremities spontaneously. Psych: Normal affect.  Labs    Chemistry Recent Labs  Lab 02/11/21 1741 02/12/21 0422 02/13/21 0441  NA 138 138 137  K 3.4* 3.6 3.6  CL 98 98 100  CO2 29 28 26   GLUCOSE 128* 101* 111*  BUN 18 18 27*  CREATININE 1.04* 0.89 1.91*  CALCIUM 8.3* 8.3* 8.0*  PROT 7.1  --   --   ALBUMIN 3.4*  --   --   AST 29  --   --   ALT 29  --   --   ALKPHOS 76  --   --   BILITOT  1.1  --   --   GFRNONAA >60 >60 30*  ANIONGAP 11 12 11      Hematology Recent Labs  Lab 02/11/21 1741 02/12/21 0422 02/13/21 0441  WBC 9.1 9.4 10.5  RBC 3.76* 3.63* 3.25*  HGB 11.3* 10.8* 9.7*  HCT 34.6* 33.3* 29.9*  MCV 92.0 91.7 92.0  MCH 30.1 29.8 29.8  MCHC 32.7 32.4 32.4  RDW 15.5 15.5 15.7*  PLT 272 275 292    Cardiac EnzymesNo results for input(s): TROPONINI in the last 168 hours. No results for input(s): TROPIPOC in the last 168 hours.   BNP Recent Labs  Lab 02/11/21 2130  BNP 1,760.0*     DDimer No results for input(s): DDIMER in the last 168 hours.   Radiology    DG Abd 1 View  Result Date: 02/11/2021 CLINICAL DATA:  60 year old female with nausea and abdominal pain. EXAM: ABDOMEN - 1 VIEW COMPARISON:  None. FINDINGS:  Support wires overlie the patient. There is no bowel dilatation or evidence of obstruction. No free air or radiopaque calculi. The osseous structures are intact. Degenerative changes of the spine. IMPRESSION: Negative. Electronically Signed   By: Anner Crete M.D.   On: 02/11/2021 21:03   DG Chest Port 1 View  Result Date: 02/11/2021 CLINICAL DATA:  Shortness of breath EXAM: PORTABLE CHEST 1 VIEW COMPARISON:  06/09/2020 FINDINGS: Cardiomegaly, vascular congestion. No overt edema. Bibasilar opacities, likely atelectasis. No visible significant effusions or pneumothorax. No acute bony abnormality. IMPRESSION: Cardiomegaly, vascular congestion. Bibasilar atelectasis. Electronically Signed   By: Rolm Baptise M.D.   On: 02/11/2021 18:46       [place] EKG SITE RITE  Result Date: 02/12/2021 If Site Rite image not attached, placement could not be confirmed due to current cardiac rhythm.   Cardiac Studies   Echocardiogram: 03/2020 IMPRESSIONS    1. Right ventricular systolic function is normal. The right ventricular  size is normal.  2. The anteroseptal wall is severely hypokinetic to akinetic.More  pronounced finding that would be seen with her LBBB alone. Left  ventricular ejection fraction, by estimation, is 45%. The left ventricle  has mildly decreased function. The left ventricle  demonstrates regional wall motion abnormalities (see scoring  diagram/findings for description). There is moderate left ventricular  hypertrophy. Left ventricular diastolic parameters are consistent with  Grade II diastolic dysfunction  (pseudonormalization).  3. Left atrial size was mildly dilated.  4. The mitral valve is normal in structure. No evidence of mitral valve  regurgitation. No evidence of mitral stenosis.  5. The aortic valve is tricuspid. Aortic valve regurgitation is not  visualized. No aortic stenosis is present.  6. The inferior vena cava is normal in size with greater than 50%   respiratory variability, suggesting right atrial pressure of 3 mmHg.   Cardiac Catheterization: 18/5631  LV end diastolic pressure is normal.   1. Normal coronary anatomy.  2. Normal LVEDP 10 mm Hg  Plan: medical management with BP control. Anticipate DC today.   Echocardiogram: 02/12/2021 IMPRESSIONS    1. Global hypokinesis with more prominent anteroseptal hypokinesis. .  Left ventricular ejection fraction, by estimation, is 25 to 30%. The left  ventricle has severely decreased function. The left ventricle demonstrates  global hypokinesis. There is  moderate left ventricular hypertrophy. Left ventricular diastolic  parameters are indeterminate.  2. Right ventricular systolic function moderately to severely reduced.  The right ventricular size is severely enlarged. There is moderately  elevated pulmonary artery systolic pressure.  3. Left atrial  size was severely dilated.  4. Right atrial size was severely dilated.  5. The mitral valve is normal in structure. No evidence of mitral valve  regurgitation. No evidence of mitral stenosis.  6. By color TR looks moderate. Hepatic systolic flow reversal would  suggest more severe TR. . The tricuspid valve is abnormal. Tricuspid valve  regurgitation is moderate to severe.  7. The aortic valve has an indeterminant number of cusps. Aortic valve  regurgitation is not visualized. No aortic stenosis is present.  8. The inferior vena cava is normal in size with <50% respiratory  variability, suggesting right atrial pressure of 8 mmHg.    Patient Profile     60 y.o. female w/ PMH of HFrEF/NICM (EF 45% by echo in 03/2020 with cath in 03/2020 showing normal cors), pSVT, HTN, HLD and Type 2 DM who is currently admitted for an acute CHF exacerbation.   Assessment & Plan    1. Acute Combined Systolic and Diastolic CHF/Biventricular Failure - Presented with abdominal pain/distension and found to have an acute CHF exacerbation with  BNP at 1760. Repeat echo this admission shows EF at 25-30% with global HK and RV function moderately to severely reduced. She did receive Lasix 40mg  BID yesterday with a recorded output of less than 500 mL. Creatinine also increased from 0.89 to 1.91 this AM. Weight not recorded for this AM.  - She was on low-dose Levophed but this has now been stopped. Co-ox was 56 on 3/21, improved to 70 this AM. Will plan for transfer to [facility] for evaluation by the Advanced Heart Failure Team. Will likely need initiation of IV Milrinone and possible RHC. Reviewed with Dr. Paticia Stack and will hold on IV Lasix for now until repeat BMET and Mg obtained (given brief runs of NSVT on telemetry). PTA Losartan and Coreg currently held due to hypotension yesterday. Further medication recommendations pending reassessment of renal function as we can hopefully start Entresto prior to d/c once renal function and BP improve.   2. Atrial Fibrillation/Flutter with RVR - Was initially on IV Cardizem but this was discontinued previously given her reduced EF. Did convert to junctional bradycardia yesterday afternoon with HR in the 40's but had recurrent tachycardia. Another episode of sinus bradycardia overnight but HR in the 140's to 150's this AM. Discussed with Dr. Paticia Stack and will restart Amiodarone at a lower dose of 30 mg/hr. Plan for transfer to [facility] as outlined above and would consider EP evaluation for tachy-brady syndrome if she continues to have episodes of this on telemetry.  - This patients CHA2DS2-VASc Score and unadjusted Ischemic Stroke Rate (% per year) is equal to 4.8 % stroke rate/year from a score of 4 (CHF, HTN, DM, Female). On Heparin at this time in case she requires invasive procedures. She has been having some hematuria this AM so will recheck a CBC in the AM.   3. Tricuspid Regurgitation - Moderate to severe by echo this admission. Consider a repeat limited echo prior to d/c once volume status has improved.   4.  Type 2 DM - Hgb A1c at 7.1 this admission. Consider addition of SGLT2 inhibitor once renal function stabilizes. Currently receiving SSI.   5. AKI -  Creatinine at 1.04 on admission, trending up to 1.91 today. Likely due to hypotension and bradycardia yesterday. Will recheck BMET now.    For questions or updates, please contact [place] Please consult [email or web address] for contact info under Cardiology/STEMI.   Signed, Erma Heritage , PA-C  12:29 PM 02/13/2021 Pager: [phone]

## 2021-02-13 NOTE — Progress Notes (Signed)
ANTICOAGULATION CONSULT NOTE -   Pharmacy Consult for heparin Indication: atrial fibrillation  No Known Allergies  Patient Measurements: Height: 5\' 5"  (165.1 cm) Weight: 76.5 kg (168 lb 10.4 oz) IBW/kg (Calculated) : 57 Heparin Dosing Weight: 72 kg  Vital Signs: Temp: 97.8 F (36.6 C) (03/22 0731) Temp Source: Oral (03/22 0731) BP: 163/97 (03/22 0731) Pulse Rate: 107 (03/22 0731)  Labs: Recent Labs    02/11/21 1741 02/12/21 0422 02/13/21 0441 02/13/21 0724  HGB 11.3* 10.8* 9.7*  --   HCT 34.6* 33.3* 29.9*  --   PLT 272 275 292  --   APTT  --   --  188* >200*  HEPARINUNFRC  --   --  >2.20*  --   CREATININE 1.04* 0.89 1.91*  --   TROPONINIHS 22*  --   --   --     Estimated Creatinine Clearance: 32.4 mL/min (A) (by C-G formula based on SCr of 1.91 mg/dL (H)).   Medical History: Past Medical History:  Diagnosis Date  . CHF (congestive heart failure) ([place])    a. EF 45% by echo in 03/2020 with normal cors by cath  . Diabetes mellitus without complication ([place])   . Diarrhea   . Herpes simplex infection   . Hyperlipidemia   . Hypertension     Medications:  Medications Prior to Admission  Medication Sig Dispense Refill Last Dose  . albuterol (VENTOLIN HFA) 108 (90 Base) MCG/ACT inhaler Inhale 1-2 puffs into the lungs every 4 (four) hours as needed.   02/11/2021 at Unknown time  . aspirin 81 MG chewable tablet Chew 1 tablet (81 mg total) by mouth daily. 30 tablet 1 02/11/2021 at Unknown time  . atorvastatin (LIPITOR) 80 MG tablet Take 80 mg by mouth daily.   02/11/2021 at Unknown time  . carvedilol (COREG) 6.25 MG tablet Take 1 tablet (6.25 mg total) by mouth 2 (two) times daily with a meal. 60 tablet 3 02/11/2021 at 1300  . furosemide (LASIX) 40 MG tablet Take 1 tablet (40 mg total) by mouth daily. 30 tablet 3 02/11/2021 at Unknown time  . ibuprofen (ADVIL) 600 MG tablet Take 600 mg by mouth every 6 (six) hours as needed.     Marland Kitchen losartan (COZAAR) 100 MG tablet Take 1  tablet (100 mg total) by mouth daily. 90 tablet 3 02/11/2021 at Unknown time  . TRADJENTA 5 MG TABS tablet Take 5 mg by mouth daily.    02/11/2021 at Unknown time  . phenazopyridine (PYRIDIUM) 200 MG tablet Take 1 tablet (200 mg total) by mouth 3 (three) times daily as needed for pain. (Patient not taking: No sig reported) 10 tablet 0 Not Taking at Unknown time  . sulfamethoxazole-trimethoprim (BACTRIM DS) 800-160 MG tablet Take 1 tablet by mouth 2 (two) times daily. (Patient not taking: No sig reported) 10 tablet 0 Completed Course at Unknown time    Assessment: Pharmacy consulted to dose heparin on patient with atrial fibrillation. Patient started on Eliquis with last dose given 3/21 @ 0930.  Will need to monitor based on aPTT until correlation with heparin levels. HL >2.2 (apixaban effects), PTT 188> and repeat> 200 sec. RN moved heparin to peripheral line so that future labs can be drawn from central line. Adjust heparin infusion  Goal of Therapy:  Heparin level 0.3-0.7 units/ml aPTT 66-102 seconds Monitor platelets by anticoagulation protocol: Yes   Plan:  Hold heparin for 1 hour and  Decrease heparin infusion at 900 units/hr Check anti-Xa level in 6  hours and daily while on heparin Continue to monitor H&H and platelets  Isac Sarna, BS [place], [place] Clinical Pharmacist Pager [phone] 02/13/2021 8:36 AM

## 2021-02-13 NOTE — Progress Notes (Addendum)
ANTICOAGULATION CONSULT NOTE - Follow Up Consult  Pharmacy Consult for IV Heparin Indication: atrial fibrillation  No Known Allergies  Patient Measurements: Height: 5\' 5"  (165.1 cm) Weight: 76.5 kg (168 lb 10.4 oz) IBW/kg (Calculated) : 57 Heparin Dosing Weight: 72.2 kg  Vital Signs: Temp: 98.4 F (36.9 C) (03/22 1640) Temp Source: Oral (03/22 1640) BP: 127/74 (03/22 1600) Pulse Rate: 113 (03/22 1640)  Labs: Recent Labs    02/11/21 1741 02/12/21 0422 02/13/21 0441 02/13/21 0724 02/13/21 1250 02/13/21 1658  HGB 11.3* 10.8* 9.7*  --   --   --   HCT 34.6* 33.3* 29.9*  --   --   --   PLT 272 275 292  --   --   --   APTT  --   --  188* >200*  --  28  HEPARINUNFRC  --   --  >2.20*  --   --   --   CREATININE 1.04* 0.89 1.91*  --  2.21*  --   TROPONINIHS 22*  --   --   --   --   --     Estimated Creatinine Clearance: 28 mL/min (A) (by C-G formula based on SCr of 2.21 mg/dL (H)).   Medical History: Past Medical History:  Diagnosis Date  . CHF (congestive heart failure) ([place])    a. EF 45% by echo in 03/2020 with normal cors by cath  . Diabetes mellitus without complication ([place])   . Diarrhea   . Herpes simplex infection   . Hyperlipidemia   . Hypertension     Assessment: Pharmacy was consulted to dose IV heparin in this 60 yr old female patient with atrial fibrillation. Patient started on apixaban during this hospitalization, with last dose given at 0938 AM on 02/12/21. Given recent apixaban use, will monitor anticoagulation using aPTT until aPTT and heparin levels correlate.    HL >2.2 units/ml (high due to apixaban effects), aPTT 188 sec; repeat aPTT >200 sec. RN moved heparin infusion to peripheral line so that future labs can be drawn from central line. Follow-up aPTT drawn ~7 hrs after heparin infusion was held and infusion rate was decreased to 900 units/hr was 28 sec, which is below the goal range for this pt. H/H 9.7/29.9 (trending down), plt 292. Per RN, latest  aPTT was drawn from central line while heparin was infusing into peripheral vein. Also, pt was having hematuria today (also noted in cardiology provider note).  Pt has now been transferred to [facility] for further care. RN on unit 2C said that pt still having pinkish urine in foley. She will monitor closely for bleeding  Goal of Therapy:  Heparin level: 0.3-0.7 units/ml aPTT 66-102 sec Will aim for lower end of goal ranges, due to hematuria Monitor platelets by anticoagulation protocol: Yes   Plan:  Increase heparin infusion to 1100 units/hr Check aPTT in 8 hrs Monitor daily aPTT, heparin level, CBC Monitor for bleeding  Gillermina Hu, PharmD, BCPS, [facility] Clinical Phamacist 02/13/2021 5:43 PM

## 2021-02-13 NOTE — Progress Notes (Signed)
PROGRESS NOTE   Michelle Morrison  EYC:[id number] DOB: August 11, 1961 DOA: 02/11/2021 PCP: Rosita Fire, MD   Chief Complaint  Patient presents with  . Abdominal Pain   Level of care: Stepdown  Brief Admission History:  60 year old female with chronic combined systolic and diastolic heart failure with an EF of 45% and grade 2 diastolic dysfunction presented to the emergency department with complaints of abdominal pain and noted to have multiple atrial tachycardias and aberrant conduction presented on EKGs.  She was hypokalemic.  Initially treated with metoprolol and after ED discussion with on-call cardiologist treated with IV Cardizem for rate control she has been having intermittent episodes of tachybradycardia.  She also had findings of vascular congestion and markedly elevated BNP.  Assessment & Plan:   Principal Problem:   Paroxysmal A-fib/Flutter(HCC) Active Problems:   HTN (hypertension), benign   Acute on chronic combined systolic and diastolic CHF (congestive heart failure) (HCC)   Diabetes mellitus type 2 with complications (HCC)   Atrial fibrillation with RVR (HCC)   Atrial tachycardia, paroxysmal (HCC)  Atrial tachycardia -Patient was initially started on IV Cardizem infusion but was having episodes of bradycardia and subsequently started on  IV amiodarone infusion.  She then started having severe hypotension and bradycardia.  I discussed with cardiology and patient started on low dose levophed and PICC line placed 3/22.  2D Echo with findings of EF 25-30% with global hypokinesis and multiple other abnormalities (see report below).  -Urine toxicology screen negative  Hypokalemia -This is been repleted. -Follow daily BMP and magnesium.  Cardiomyopathy -2D echo ordered with EF 25-30% with global hypokinesis.  Possible transfer to [facility] for heart failure team consult.   Hypotension  -Pt responded well to IV levophed infusion.    Type 2 diabetes mellitus uncontrolled as  evidenced by hemoglobin A1c of 7.1%.  -Patient remains on SSI coverage and frequent CBG testing.  Normocytic anemia -Iron panel came back within normal limits -Follow CBC   DVT prophylaxis: Apixaban Code Status: Full Family Communication:  Disposition: Home  Status is: Inpatient   The patient will require care spanning > 2 midnights and should be moved to inpatient because: IV treatments appropriate due to intensity of illness or inability to take PO and Inpatient level of care appropriate due to severity of illness  Dispo: The patient is from: Home              Anticipated d/c is to: Home              Patient currently is not medically stable to d/c.   Difficult to place patient No   Consultants:   cardiology  Procedures:     Antimicrobials:     Subjective: Pt reports feeling much better this morning.     Objective: Vitals:   02/13/21 0830 02/13/21 0845 02/13/21 0900 02/13/21 0912  BP: (!) 158/107   (!) 127/94  Pulse: (!) 135 (!) 137 73 (!) 135  Resp: (!) 24 (!) 22 (!) 22 (!) 22  Temp:      TempSrc:      SpO2: 100% 96% 100% 100%  Weight:      Height:        Intake/Output Summary (Last 24 hours) at 02/13/2021 0959 Last data filed at 02/12/2021 2252 Gross per 24 hour  Intake 88.39 ml  Output 400 ml  Net -311.61 ml   Filed Weights   02/11/21 1737 02/12/21 0421  Weight: 74.4 kg 76.5 kg    Examination:  General exam: sitting up in bed, awake, alert, Appears calm and comfortable  Respiratory system: no increased work of breathing. Cardiovascular system: normal S1 & S2 heard. Irregularly irregular.  1+ JVD, trace pedal edema. Gastrointestinal system: Abdomen is nondistended, soft and nontender. No organomegaly or masses felt. Normal bowel sounds heard. Central nervous system: Alert and oriented. No focal neurological deficits. Extremities: Symmetric 5 x 5 power. Skin: No rashes, lesions or ulcers.  Psychiatry: Judgement and insight appear normal. Mood &  affect appropriate.   Data Reviewed: I have personally reviewed following labs and imaging studies  CBC: Recent Labs  Lab 02/11/21 1741 02/12/21 0422 02/13/21 0441  WBC 9.1 9.4 10.5  NEUTROABS  --   --  8.1*  HGB 11.3* 10.8* 9.7*  HCT 34.6* 33.3* 29.9*  MCV 92.0 91.7 92.0  PLT 272 275 093    Basic Metabolic Panel: Recent Labs  Lab 02/11/21 1741 02/11/21 1757 02/12/21 0422 02/13/21 0441  NA 138  --  138 137  K 3.4*  --  3.6 3.6  CL 98  --  98 100  CO2 29  --  28 26  GLUCOSE 128*  --  101* 111*  BUN 18  --  18 27*  CREATININE 1.04*  --  0.89 1.91*  CALCIUM 8.3*  --  8.3* 8.0*  MG  --  1.7  --  1.9    GFR: Estimated Creatinine Clearance: 32.4 mL/min (A) (by C-G formula based on SCr of 1.91 mg/dL (H)).  Liver Function Tests: Recent Labs  Lab 02/11/21 1741  AST 29  ALT 29  ALKPHOS 76  BILITOT 1.1  PROT 7.1  ALBUMIN 3.4*    CBG: Recent Labs  Lab 02/12/21 0718 02/12/21 1113 02/12/21 1605 02/12/21 2152 02/13/21 0733  GLUCAP 96 187* 120* 152* 90    Recent Results (from the past 240 hour(s))  Resp Panel by RT-PCR (Flu A&B, Covid) Nasopharyngeal Swab     Status: None   Collection Time: 02/11/21  7:55 PM   Specimen: Nasopharyngeal Swab; Nasopharyngeal(NP) swabs in vial transport medium  Result Value Ref Range Status   SARS Coronavirus 2 by RT PCR NEGATIVE NEGATIVE Final    Comment: (NOTE) SARS-CoV-2 target nucleic acids are NOT DETECTED.  The SARS-CoV-2 RNA is generally detectable in upper respiratory specimens during the acute phase of infection. The lowest concentration of SARS-CoV-2 viral copies this assay can detect is 138 copies/mL. A negative result does not preclude SARS-Cov-2 infection and should not be used as the sole basis for treatment or other patient management decisions. A negative result may occur with  improper specimen collection/handling, submission of specimen other than nasopharyngeal swab, presence of viral mutation(s) within  the areas targeted by this assay, and inadequate number of viral copies(<138 copies/mL). A negative result must be combined with clinical observations, patient history, and epidemiological information. The expected result is Negative.  Fact Sheet for Patients:  EntrepreneurPulse.com.au  Fact Sheet for Healthcare Providers:  IncredibleEmployment.be  This test is no t yet approved or cleared by the [place] FDA and  has been authorized for detection and/or diagnosis of SARS-CoV-2 by FDA under an Emergency Use Authorization (EUA). This EUA will remain  in effect (meaning this test can be used) for the duration of the COVID-19 declaration under Section 564(b)(1) of the Act, 21 U.S.C.section 360bbb-3(b)(1), unless the authorization is terminated  or revoked sooner.       Influenza A by PCR NEGATIVE NEGATIVE Final   Influenza B by PCR  NEGATIVE NEGATIVE Final    Comment: (NOTE) The Xpert Xpress SARS-CoV-2/FLU/RSV plus assay is intended as an aid in the diagnosis of influenza from Nasopharyngeal swab specimens and should not be used as a sole basis for treatment. Nasal washings and aspirates are unacceptable for Xpert Xpress SARS-CoV-2/FLU/RSV testing.  Fact Sheet for Patients: EntrepreneurPulse.com.au  Fact Sheet for Healthcare Providers: IncredibleEmployment.be  This test is not yet approved or cleared by the [place] FDA and has been authorized for detection and/or diagnosis of SARS-CoV-2 by FDA under an Emergency Use Authorization (EUA). This EUA will remain in effect (meaning this test can be used) for the duration of the COVID-19 declaration under Section 564(b)(1) of the Act, 21 U.S.C. section 360bbb-3(b)(1), unless the authorization is terminated or revoked.  Performed at [facility], [address], [place], [place] [address]   MRSA PCR Screening     Status: None   Collection Time:  02/11/21  9:58 PM   Specimen: Nasal Mucosa; Nasopharyngeal  Result Value Ref Range Status   MRSA by PCR NEGATIVE NEGATIVE Final    Comment:        The GeneXpert MRSA Assay (FDA approved for NASAL specimens only), is one component of a comprehensive MRSA colonization surveillance program. It is not intended to diagnose MRSA infection nor to guide or monitor treatment for MRSA infections. Performed at [facility], [address] [place],  [address]      Radiology Studies: DG Abd 1 View  Result Date: 02/11/2021 CLINICAL DATA:  60 year old female with nausea and abdominal pain. EXAM: ABDOMEN - 1 VIEW COMPARISON:  None. FINDINGS: Support wires overlie the patient. There is no bowel dilatation or evidence of obstruction. No free air or radiopaque calculi. The osseous structures are intact. Degenerative changes of the spine. IMPRESSION: Negative. Electronically Signed   By: Anner Crete M.D.   On: 02/11/2021 21:03   DG Chest Port 1 View  Result Date: 02/11/2021 CLINICAL DATA:  Shortness of breath EXAM: PORTABLE CHEST 1 VIEW COMPARISON:  06/09/2020 FINDINGS: Cardiomegaly, vascular congestion. No overt edema. Bibasilar opacities, likely atelectasis. No visible significant effusions or pneumothorax. No acute bony abnormality. IMPRESSION: Cardiomegaly, vascular congestion. Bibasilar atelectasis. Electronically Signed   By: Rolm Baptise M.D.   On: 02/11/2021 18:46   ECHOCARDIOGRAM COMPLETE  Result Date: 02/12/2021    ECHOCARDIOGRAM REPORT   [name] Date of Exam: 02/12/2021 Medical Rec #:  [id number]      Height:       65.0 in Accession #:    [id number]     Weight:       168.7 lb Date of Birth:  02/09/1961      BSA:          1.840 m Patient Age:    57 years       BP:           75/54 mmHg Patient Gender: F              HR:           42 bpm. Exam Location:  [facility] Procedure: 2D Echo, Cardiac Doppler and Color Doppler Indications:    Dyspnea R06.00  History:         Patient has prior history of Echocardiogram examinations, most                 recent 04/13/2020. CHF, Arrythmias:LBBB; Risk  Factors:Hypertension, Diabetes, Former Smoker and Dyslipidemia.                 Paroxysmal A-fib/Flutter, Atrial tachycardia, paroxysmal.  Sonographer:    Alvino Chapel RCS Referring Phys: [id number] [place]  1. Global hypokinesis with more prominent anteroseptal hypokinesis. . Left ventricular ejection fraction, by estimation, is 25 to 30%. The left ventricle has severely decreased function. The left ventricle demonstrates global hypokinesis. There is moderate left ventricular hypertrophy. Left ventricular diastolic parameters are indeterminate.  2. Right ventricular systolic function moderately to severely reduced. The right ventricular size is severely enlarged. There is moderately elevated pulmonary artery systolic pressure.  3. Left atrial size was severely dilated.  4. Right atrial size was severely dilated.  5. The mitral valve is normal in structure. No evidence of mitral valve regurgitation. No evidence of mitral stenosis.  6. By color TR looks moderate. Hepatic systolic flow reversal would suggest more severe TR. . The tricuspid valve is abnormal. Tricuspid valve regurgitation is moderate to severe.  7. The aortic valve has an indeterminant number of cusps. Aortic valve regurgitation is not visualized. No aortic stenosis is present.  8. The inferior vena cava is normal in size with <50% respiratory variability, suggesting right atrial pressure of 8 mmHg. FINDINGS  Left Ventricle: Global hypokinesis with more prominent anteroseptal hypokinesis. Left ventricular ejection fraction, by estimation, is 25 to 30%. The left ventricle has severely decreased function. The left ventricle demonstrates global hypokinesis. The  left ventricular internal cavity size was normal in size. There is moderate left ventricular hypertrophy. Left ventricular diastolic  parameters are indeterminate. Right Ventricle: The ventricular septum is flattened in systole and diastole consistent with RV pressure and volume overload. The right ventricular size is severely enlarged. Right vetricular wall thickness was not assessed. Right ventricular systolic function moderately to severely reduced. There is moderately elevated pulmonary artery systolic pressure. The tricuspid regurgitant velocity is 2.99 m/s, and with an assumed right atrial pressure of 15 mmHg, the estimated right ventricular systolic pressure is 09.3 mmHg. Left Atrium: Left atrial size was severely dilated. Right Atrium: Right atrial size was severely dilated. Pericardium: There is no evidence of pericardial effusion. Mitral Valve: The mitral valve is normal in structure. No evidence of mitral valve regurgitation. No evidence of mitral valve stenosis. Tricuspid Valve: By color TR looks moderate. Hepatic systolic flow reversal would suggest more severe TR. The tricuspid valve is abnormal. Tricuspid valve regurgitation is moderate to severe. No evidence of tricuspid stenosis. Aortic Valve: The aortic valve has an indeterminant number of cusps. Aortic valve regurgitation is not visualized. No aortic stenosis is present. Aortic valve mean gradient measures 2.5 mmHg. Aortic valve peak gradient measures 4.4 mmHg. Aortic valve area, by VTI measures 2.03 cm. Pulmonic Valve: The pulmonic valve was not well visualized. Pulmonic valve regurgitation is not visualized. No evidence of pulmonic stenosis. Aorta: The aortic root is normal in size and structure. Pulmonary Artery: Moderate pulmonary HTN, PASP is 44 mmHg. Venous: The inferior vena cava is normal in size with less than 50% respiratory variability, suggesting right atrial pressure of 8 mmHg. IAS/Shunts: No atrial level shunt detected by color flow Doppler.  LEFT VENTRICLE PLAX 2D LVIDd:         4.40 cm LVIDs:         3.80 cm LV PW:         1.40 cm LV IVS:        1.30 cm LVOT  diam:  1.90 cm LV SV:         41 LV SV Index:   22 LVOT Area:     2.84 cm  RIGHT VENTRICLE TAPSE (M-mode): 1.4 cm LEFT ATRIUM             Index       RIGHT ATRIUM           Index LA diam:        4.40 cm 2.39 cm/m  RA Area:     22.80 cm LA Vol (A2C):   84.2 ml 45.76 ml/m RA Volume:   74.60 ml  40.55 ml/m LA Vol (A4C):   63.7 ml 34.62 ml/m LA Biplane Vol: 74.0 ml 40.22 ml/m  AORTIC VALVE AV Area (Vmax):    2.20 cm AV Area (Vmean):   2.18 cm AV Area (VTI):     2.03 cm AV Vmax:           105.20 cm/s AV Vmean:          73.642 cm/s AV VTI:            0.202 m AV Peak Grad:      4.4 mmHg AV Mean Grad:      2.5 mmHg LVOT Vmax:         81.60 cm/s LVOT Vmean:        56.500 cm/s LVOT VTI:          0.145 m LVOT/AV VTI ratio: 0.72  AORTA Ao Root diam: 2.60 cm MITRAL VALVE                TRICUSPID VALVE MV Area (PHT): 6.60 cm     TR Peak grad:   35.8 mmHg MV Decel Time: 115 msec     TR Vmax:        299.00 cm/s MV E velocity: 108.00 cm/s                             SHUNTS                             Systemic VTI:  0.14 m                             Systemic Diam: 1.90 cm Carlyle Dolly MD Electronically signed by Carlyle Dolly MD Signature Date/Time: 02/12/2021/3:43:57 PM    Final    [place] EKG SITE RITE  Result Date: 02/12/2021 If Site Rite image not attached, placement could not be confirmed due to current cardiac rhythm.   Scheduled Meds: . atorvastatin  80 mg Oral Daily  . Chlorhexidine Gluconate Cloth  6 each Topical Daily  . insulin aspart  0-5 Units Subcutaneous QHS  . insulin aspart  0-9 Units Subcutaneous TID WC  . pantoprazole  40 mg Oral Daily  . sodium chloride flush  10-40 mL Intracatheter Q12H  . sodium chloride flush  3 mL Intravenous Q12H  . sodium chloride flush  3 mL Intravenous Q12H   Continuous Infusions: . sodium chloride    . heparin    . norepinephrine (LEVOPHED) Adult infusion 2 mcg/min (02/12/21 2150)     LOS: 1 day   Time spent: 35 mins   Amara Manalang Wynetta Emery, MD How to  contact the [facility] Attending or Consulting provider [place] or covering provider during after hours [place], for  this patient?  1. Check the care team in [facility] and look for a) attending/consulting [facility] provider listed and b) the [facility] team listed 2. Log into [email or web address] and use [place]'s universal password to access. If you do not have the password, please contact the hospital operator. 3. Locate the [facility] provider you are looking for under [facility] and page to a number that you can be directly reached. 4. If you still have difficulty reaching the provider, please page the [facility] (Director on Call) for the Hospitalists listed on amion for assistance.  02/13/2021, 9:59 AM

## 2021-02-13 NOTE — Progress Notes (Signed)
Michelle Morrison is a pleasant 60 year old female with past medical history of nonischemic cardiomyopathy, PSVT, hypertension, hyperlipidemia and a history of atrial tachycardia.  Patient recently presented to [facility] on 02/11/2021 with abdominal pain and swelling.  She was noted to be in PSVT with postconversion pauses and possible atrial fibrillation of atrial flutter.  She was also volume overloaded consistent with acute on chronic combined systolic and diastolic heart failure.  Patient was placed on IV Lasix with good diuresis however renal function subsequently worsened.  She also had intermittent hypotension requiring pressors.  Admission creatinine was 1.04, her creatinine trended up to 2.21 today.  Given her deteriorating renal function and uncontrolled heart rate, she was eventually transferred to [facility] for further evaluation of biventricular heart failure.  On arrival to [facility], she appears to be alert and oriented.  I interviewed the patient while she was eating.  She denies any significant chest discomfort or or shortness of breath.  On exam she has no lower extremity edema.  Majority of her lungs is clear.  Heart rate is tachycardic in the 150s range.  Unclear if she is currently in sinus tachycardia or 2-1 atrial flutter.  Overall, patient is hemodynamically stable.  Heart failure service to see the patient tomorrow.  Patient has been instructed to contact nurse if she has any cardiac discomfort.  Only complaint for the patient is she still have some lower abdominal tightness.  Will you follow overnight fellow to keep eye on her.

## 2021-02-14 DIAGNOSIS — N179 Acute kidney failure, unspecified: Secondary | ICD-10-CM | POA: Diagnosis not present

## 2021-02-14 DIAGNOSIS — I471 Supraventricular tachycardia: Secondary | ICD-10-CM

## 2021-02-14 DIAGNOSIS — I484 Atypical atrial flutter: Secondary | ICD-10-CM

## 2021-02-14 DIAGNOSIS — I5023 Acute on chronic systolic (congestive) heart failure: Secondary | ICD-10-CM

## 2021-02-14 DIAGNOSIS — I480 Paroxysmal atrial fibrillation: Secondary | ICD-10-CM

## 2021-02-14 LAB — CBC WITH DIFFERENTIAL/PLATELET
Abs Immature Granulocytes: 0.04 10*3/uL (ref 0.00–0.07)
Basophils Absolute: 0.1 10*3/uL (ref 0.0–0.1)
Basophils Relative: 1 %
Eosinophils Absolute: 0.2 10*3/uL (ref 0.0–0.5)
Eosinophils Relative: 2 %
HCT: 29.2 % — ABNORMAL LOW (ref 36.0–46.0)
Hemoglobin: 9.5 g/dL — ABNORMAL LOW (ref 12.0–15.0)
Immature Granulocytes: 1 %
Lymphocytes Relative: 16 %
Lymphs Abs: 1.3 10*3/uL (ref 0.7–4.0)
MCH: 30 pg (ref 26.0–34.0)
MCHC: 32.5 g/dL (ref 30.0–36.0)
MCV: 92.1 fL (ref 80.0–100.0)
Monocytes Absolute: 0.6 10*3/uL (ref 0.1–1.0)
Monocytes Relative: 7 %
Neutro Abs: 6.3 10*3/uL (ref 1.7–7.7)
Neutrophils Relative %: 73 %
Platelets: 266 10*3/uL (ref 150–400)
RBC: 3.17 MIL/uL — ABNORMAL LOW (ref 3.87–5.11)
RDW: 15.9 % — ABNORMAL HIGH (ref 11.5–15.5)
WBC: 8.5 10*3/uL (ref 4.0–10.5)
nRBC: 0 % (ref 0.0–0.2)

## 2021-02-14 LAB — GLUCOSE, CAPILLARY
Glucose-Capillary: 120 mg/dL — ABNORMAL HIGH (ref 70–99)
Glucose-Capillary: 115 mg/dL — ABNORMAL HIGH (ref 70–99)
Glucose-Capillary: 173 mg/dL — ABNORMAL HIGH (ref 70–99)
Glucose-Capillary: 90 mg/dL (ref 70–99)
Glucose-Capillary: 121 mg/dL — ABNORMAL HIGH (ref 70–99)

## 2021-02-14 LAB — COOXEMETRY PANEL
Carboxyhemoglobin: 0.8 % (ref 0.5–1.5)
Methemoglobin: 0.9 % (ref 0.0–1.5)
O2 Saturation: 77.6 %
Total hemoglobin: 9.8 g/dL — ABNORMAL LOW (ref 12.0–16.0)

## 2021-02-14 LAB — BASIC METABOLIC PANEL
Anion gap: 10 (ref 5–15)
Anion gap: 6 (ref 5–15)
Anion gap: 10 (ref 5–15)
BUN: 25 mg/dL — ABNORMAL HIGH (ref 6–20)
BUN: 24 mg/dL — ABNORMAL HIGH (ref 6–20)
BUN: 24 mg/dL — ABNORMAL HIGH (ref 6–20)
CO2: 25 mmol/L (ref 22–32)
CO2: 29 mmol/L (ref 22–32)
CO2: 31 mmol/L (ref 22–32)
Calcium: 7.7 mg/dL — ABNORMAL LOW (ref 8.9–10.3)
Calcium: 8.6 mg/dL — ABNORMAL LOW (ref 8.9–10.3)
Calcium: 8.3 mg/dL — ABNORMAL LOW (ref 8.9–10.3)
Chloride: 97 mmol/L — ABNORMAL LOW (ref 98–111)
Chloride: 102 mmol/L (ref 98–111)
Chloride: 96 mmol/L — ABNORMAL LOW (ref 98–111)
Creatinine, Ser: 1.82 mg/dL — ABNORMAL HIGH (ref 0.44–1.00)
Creatinine, Ser: 1.75 mg/dL — ABNORMAL HIGH (ref 0.44–1.00)
Creatinine, Ser: 1.57 mg/dL — ABNORMAL HIGH (ref 0.44–1.00)
GFR, Estimated: 32 mL/min — ABNORMAL LOW (ref >60)
GFR, Estimated: 33 mL/min — ABNORMAL LOW (ref >60)
GFR, Estimated: 38 mL/min — ABNORMAL LOW (ref >60)
Glucose, Bld: 289 mg/dL — ABNORMAL HIGH (ref 70–99)
Glucose, Bld: 140 mg/dL — ABNORMAL HIGH (ref 70–99)
Glucose, Bld: 201 mg/dL — ABNORMAL HIGH (ref 70–99)
Potassium: 4 mmol/L (ref 3.5–5.1)
Potassium: 4.2 mmol/L (ref 3.5–5.1)
Potassium: 3.7 mmol/L (ref 3.5–5.1)
Sodium: 132 mmol/L — ABNORMAL LOW (ref 135–145)
Sodium: 137 mmol/L (ref 135–145)
Sodium: 137 mmol/L (ref 135–145)

## 2021-02-14 LAB — APTT
aPTT: >200 seconds (ref 24–36)
aPTT: 132 seconds — ABNORMAL HIGH (ref 24–36)
aPTT: 77 seconds — ABNORMAL HIGH (ref 24–36)

## 2021-02-14 LAB — HEPARIN LEVEL (UNFRACTIONATED): Heparin Unfractionated: 1.98 IU/mL — ABNORMAL HIGH (ref 0.30–0.70)

## 2021-02-14 LAB — MAGNESIUM: Magnesium: 2.4 mg/dL (ref 1.7–2.4)

## 2021-02-14 MED ORDER — POTASSIUM CHLORIDE CRYS ER 20 MEQ PO TBCR
20.0000 meq | EXTENDED_RELEASE_TABLET | Freq: Once | ORAL | Status: AC
  Administered 2021-02-14: 20 meq via ORAL
  Filled 2021-02-14: qty 1

## 2021-02-14 MED ORDER — FUROSEMIDE 10 MG/ML IJ SOLN
80.0000 mg | Freq: Once | INTRAMUSCULAR | Status: AC
  Administered 2021-02-14: 80 mg via INTRAVENOUS
  Filled 2021-02-14: qty 8

## 2021-02-14 MED ORDER — HEPARIN (PORCINE) 25000 UT/250ML-% IV SOLN
900.0000 [IU]/h | INTRAVENOUS | Status: DC
  Administered 2021-02-14: 900 [IU]/h via INTRAVENOUS
  Filled 2021-02-14: qty 250

## 2021-02-14 MED ORDER — HEPARIN (PORCINE) 25000 UT/250ML-% IV SOLN
800.0000 [IU]/h | INTRAVENOUS | Status: DC
  Administered 2021-02-14: 750 [IU]/h via INTRAVENOUS
  Filled 2021-02-14: qty 250

## 2021-02-14 MED ORDER — ISOSORB DINITRATE-HYDRALAZINE 20-37.5 MG PO TABS
0.5000 | ORAL_TABLET | Freq: Three times a day (TID) | ORAL | Status: DC
  Administered 2021-02-14 – 2021-02-17 (×12): 0.5 via ORAL
  Filled 2021-02-14 (×12): qty 1

## 2021-02-14 MED ORDER — ALBUTEROL SULFATE (2.5 MG/3ML) 0.083% IN NEBU: 2.5000 mg | INHALATION_SOLUTION | RESPIRATORY_TRACT | Status: DC | PRN

## 2021-02-14 MED ORDER — FUROSEMIDE 10 MG/ML IJ SOLN
10.0000 mg/h | INTRAVENOUS | Status: DC
  Administered 2021-02-14 – 2021-02-16 (×3): 10 mg/h via INTRAVENOUS
  Filled 2021-02-14 (×4): qty 20

## 2021-02-14 NOTE — Consult Note (Signed)
Advanced Heart Failure Team Consult Note   Primary Physician: Rosita Fire, MD PCP-Cardiologist:  Carlyle Dolly, MD  Reason for Consultation: CHF/atrial flutter  HPI:    Michelle Morrison is seen today for evaluation of CHF/atrial flutter vs atrial tachycardia at the request of Dr. Harl Bowie.   Patient has history of HTN, SVT, and mild nonischemic cardiomopathy.  In 5/21, echo showed EF 45% with severe anteroseptal hypokinesis.  LHC in 5/21 showed normal coronaries.  She has LBBB at baseline.  She has been noted to have SVT in the past.  On 02/11/21, she went to the ER at [facility] with abdominal pain and distention.  She was noted to be in atypical flutter versus atrial tachy with rate around 150.  She was hypotensive and got IVF, was also transiently on norepinephrine.  HR dropped transiently to the 40s with junctional bradycardia per report but I do not see any strips. AKI with creatinine up to 2.21.  Echo this admission showed EF 25-30%, moderate RV enlargement and moderately decreased RV systolic function with D-shaped septum, biatrial enlargement.  Concern for tachy-brady syndrome, she was transferred to [facility] for further evaluation.    Initially, HR 150 in AFL vs atypical flutter.  Amiodarone gtt started, now in NSR in 100s.  She feels better today, not short of breath at rest.  However, CVP 19-20 on my read.  Co-ox excellent at 78%.  Creatinine lower at 1.8.   Review of Systems: All systems reviewed and negative except as per HPI.   Home Medications Prior to Admission medications   Medication Sig Start Date End Date Taking? Authorizing Provider  albuterol (VENTOLIN HFA) 108 (90 Base) MCG/ACT inhaler Inhale 1-2 puffs into the lungs every 4 (four) hours as needed. 05/30/20  Yes [provider]  aspirin 81 MG chewable tablet Chew 1 tablet (81 mg total) by mouth daily. 04/16/20  Yes Hosie Poisson, MD  atorvastatin (LIPITOR) 80 MG tablet Take 80 mg by mouth daily. 04/15/20  Yes  [provider]  carvedilol (COREG) 6.25 MG tablet Take 1 tablet (6.25 mg total) by mouth 2 (two) times daily with a meal. 06/10/20  Yes Rai, Ripudeep K, MD  furosemide (LASIX) 40 MG tablet Take 1 tablet (40 mg total) by mouth daily. 06/11/20  Yes Rai, Ripudeep K, MD  ibuprofen (ADVIL) 600 MG tablet Take 600 mg by mouth every 6 (six) hours as needed. 01/16/21  Yes [provider]  losartan (COZAAR) 100 MG tablet Take 1 tablet (100 mg total) by mouth daily. 08/07/20 11/05/20 Yes Branch, Alphonse Guild, MD  TRADJENTA 5 MG TABS tablet Take 5 mg by mouth daily.  07/28/16  Yes [provider]  phenazopyridine (PYRIDIUM) 200 MG tablet Take 1 tablet (200 mg total) by mouth 3 (three) times daily as needed for pain. Patient not taking: No sig reported 01/04/21   Cresenzo-Dishmon, Joaquim Lai, CNM  sulfamethoxazole-trimethoprim (BACTRIM DS) 800-160 MG tablet Take 1 tablet by mouth 2 (two) times daily. Patient not taking: No sig reported 01/04/21   Christin Fudge, CNM    Past Medical History: 1. SVT 2. HTN 3. Nonischemic cardiomyopathy: Echo in 5/21 with EF 45%, severe anteroseptal HK.  - LHC (5/21): normal coronaries - Echo (3/22): EF 25-30%, moderate RV enlargement and moderately decreased RV systolic function with D-shaped septum, biatrial enlargement.   Past Surgical History: Past Surgical History:  Procedure Laterality Date  . CESAREAN SECTION    . CHOLECYSTECTOMY    . COLONOSCOPY N/A 08/26/2014  Procedure: COLONOSCOPY;  Surgeon: [name], MD;  Location: [facility];  Service: Endoscopy;  Laterality: N/A;  9:30 AM - moved to 8:30 - Ginger to notify pt  . LEFT HEART CATH AND CORONARY ANGIOGRAPHY N/A 04/14/2020   Procedure: LEFT HEART CATH AND CORONARY ANGIOGRAPHY;  Surgeon: [name], [name], MD;  Location: [facility];  Service: Cardiovascular;  Laterality: N/A;    Family History: Family History  Problem Relation Age of Onset  . Hypertension Mother   .  Diabetes Mother   . Colon cancer Neg Hx     Social History: Social History   Socioeconomic History  . Marital status: Single    Spouse name: Not on file  . Number of children: Not on file  . Years of education: Not on file  . Highest education level: Not on file  Occupational History  . Not on file  Tobacco Use  . Smoking status: Never Smoker  . Smokeless tobacco: Never Used  Vaping Use  . Vaping Use: Never used  Substance and Sexual Activity  . Alcohol use: No    Alcohol/week: 0.0 standard drinks  . Drug use: No  . Sexual activity: Not Currently    Birth control/protection: Post-menopausal  Other Topics Concern  . Not on file  Social History Narrative  . Not on file   Social Determinants of Health   Financial Resource Strain: Not on file  Food Insecurity: Not on file  Transportation Needs: No Transportation Needs  . Lack of Transportation (Medical): No  . Lack of Transportation (Non-Medical): No  Physical Activity: Not on file  Stress: Not on file  Social Connections: Not on file    Allergies:  No Known Allergies  Objective:    Vital Signs:   Temp:  [97.6 F (36.4 C)-98.5 F (36.9 C)] 98.1 F (36.7 C) (03/23 0830) Pulse Rate:  [61-150] 111 (03/23 0830) Resp:  [16-29] 18 (03/23 0830) BP: (77-161)/(54-114) 151/99 (03/23 0830) SpO2:  [92 %-100 %] 97 % (03/23 0830) Weight:  [80.8 kg] 80.8 kg (03/23 0500) Last BM Date: 02/12/21  Weight change: Filed Weights   02/11/21 1737 02/12/21 0421 02/14/21 0500  Weight: 74.4 kg 76.5 kg 80.8 kg    Intake/Output:   Intake/Output Summary (Last 24 hours) at 02/14/2021 0917 Last data filed at 02/13/2021 1500 Gross per 24 hour  Intake 300 ml  Output --  Net 300 ml      Physical Exam    General:  Well appearing. No resp difficulty HEENT: normal Neck: supple. JVP 16+. Carotids 2+ bilat; no bruits. No lymphadenopathy or thyromegaly appreciated. Cor: PMI nondisplaced. Tachy, regular rate & rhythm. No rubs,  gallops or murmurs. Lungs: Crackles at bases.  Abdomen: soft, nontender, mildly distended. No hepatosplenomegaly. No bruits or masses. Good bowel sounds. Extremities: no cyanosis, clubbing, rash, edema Neuro: alert & orientedx3, cranial nerves grossly intact. moves all 4 extremities w/o difficulty. Affect pleasant   Telemetry   Sinus tachy 100s (personally reviewed)  EKG    Atrial tachy vs atypical flutter rate 150s with LBBB => Sinus tachy 109 with LBBB (personally reviewed)  Labs   Basic Metabolic Panel: Recent Labs  Lab 02/11/21 1741 02/11/21 1757 02/12/21 0422 02/13/21 0441 02/13/21 1250 02/14/21 0735  NA 138  --  138 137 136 132*  K 3.4*  --  3.6 3.6 3.3* 4.0  CL 98  --  98 100 100 97*  CO2 29  --  28 26 25 25   GLUCOSE  128*  --  101* 111* 170* 289*  BUN 18  --  18 27* 30* 25*  CREATININE 1.04*  --  0.89 1.91* 2.21* 1.82*  CALCIUM 8.3*  --  8.3* 8.0* 8.2* 7.7*  MG  --  1.7  --  1.9 2.0 2.4    Liver Function Tests: Recent Labs  Lab 02/11/21 1741  AST 29  ALT 29  ALKPHOS 76  BILITOT 1.1  PROT 7.1  ALBUMIN 3.4*   Recent Labs  Lab 02/11/21 1741  LIPASE 26   No results for input(s): AMMONIA in the last 168 hours.  CBC: Recent Labs  Lab 02/11/21 1741 02/12/21 0422 02/13/21 0441 02/14/21 0735  WBC 9.1 9.4 10.5 8.5  NEUTROABS  --   --  8.1* 6.3  HGB 11.3* 10.8* 9.7* 9.5*  HCT 34.6* 33.3* 29.9* 29.2*  MCV 92.0 91.7 92.0 92.1  PLT 272 275 292 266    Cardiac Enzymes: No results for input(s): CKTOTAL, CKMB, CKMBINDEX, TROPONINI in the last 168 hours.  BNP: BNP (last 3 results) Recent Labs    04/13/20 0053 06/09/20 0358 02/11/21 2130  BNP 364.0* 260.0* 1,760.0*    ProBNP (last 3 results) No results for input(s): PROBNP in the last 8760 hours.   CBG: Recent Labs  Lab 02/13/21 1144 02/13/21 1642 02/13/21 2105 02/14/21 0528 02/14/21 0828  GLUCAP 180* 117* 141* 120* 115*    Coagulation Studies: No results for input(s): LABPROT, INR  in the last 72 hours.   Imaging    No results found.   Medications:     Current Medications: . atorvastatin  80 mg Oral Daily  . Chlorhexidine Gluconate Cloth  6 each Topical Daily  . furosemide  80 mg Intravenous Once  . insulin aspart  0-5 Units Subcutaneous QHS  . insulin aspart  0-9 Units Subcutaneous TID WC  . isosorbide-hydrALAZINE  0.5 tablet Oral TID  . pantoprazole  40 mg Oral Daily  . sodium chloride flush  10-40 mL Intracatheter Q12H  . sodium chloride flush  3 mL Intravenous Q12H  . sodium chloride flush  3 mL Intravenous Q12H     Infusions: . sodium chloride    . amiodarone 30 mg/hr (02/13/21 1817)  . furosemide (LASIX) 200 mg in dextrose 5% 100 mL (2mg /mL) infusion    . heparin 900 Units/hr (02/14/21 0503)        Assessment/Plan   1. Acute on chronic systolic CHF:  Nonischemic cardiomyopathy based on 5/21 cath but echo at that time showed EF higher at 45%.  She was admitted 3/20 with volume overload and AFL versus AT with HR in 150s.  Echo this admission with EF 25-30%, moderate RV enlargement and moderately decreased RV systolic function with D-shaped septum, biatrial enlargement. Initial hypotension and AKI in setting of tachycardia.  HR now improved in ST with CVP 19-20, co-ox 78%.  - She does not need inotrope at this time.  - Lasix 80 mg IV x 1 then 10 mg/hr today with K replacement.  - BP stable, add Bidil 0.5 tab tid.  - Maintain NSR with amiodarone gtt.  - Will need RHC after more diuresis, depending on creatinine may repeat coronary angiography given significant drop in EF.  - Once diuresed, will also arrange for cardiac MRI for cardiomyopathy.  2. Rhythm: Atypical atrial flutter versus atrial tachycardia.  Also with post-termination pauses and episodes of junctional brady apparently at [facility]. Currently in NSR on amiodarone gtt.  - As she may have flutter,  will keep anticoagulated (heparin gtt for now).  - Continue amiodarone gtt.  - Will  have EP evaluate her with AFL vs AT and possible tachy-brady syndrome.  3. AKI: Baseline creatinine around 1.  Suspect AKI due to tachycardia/hypotension at [facility].  Creatinine as high as 2.2, now 1.8.  Hopefully will continue to trend down as we diurese if she stays in NSR.  4. LBBB: has at baseline, QRS around 130 msec.   Length of Stay: 2  Loralie Champagne, MD  02/14/2021, 9:17 AM  Advanced Heart Failure Team Pager [phone] (M-F; 7a - 5p)  Please contact [facility] for night-coverage after hours (4p -7a ) and weekends on [email or web address]

## 2021-02-14 NOTE — Consult Note (Addendum)
ELECTROPHYSIOLOGY CONSULT NOTE    Patient ID: Michelle Morrison MRN: [id number], DOB/AGE: September 30, 1961 60 y.o.  Admit date: 02/11/2021 Date of Consult: 02/14/2021  Primary Physician: Rosita Fire, MD Primary Cardiologist: Carlyle Dolly, MD  Electrophysiologist:  New  Referring Provider: Dr. [name]  Patient Profile: Michelle Morrison is a 61 y.o. female with a history of HTN, SVT, mild NICM (Echo 03/2020 LVEF 45%), LHC 03/2020 normal cors, and LBBB who is being seen today for the evaluation of unclear atrial arrhyhtmia and ? Tachy-brady at the request of Dr. [name].  HPI:  Michelle Morrison is a 60 y.o. female with medical history above.   She presented to [facility] 02/11/21 with abdominal pain and distention. Noted to be in atypical aflutter vs AT with rates in 150s. Hypotensive and received IVF, transiently on NE.  She converted to NSR and apparently had junctional bradycardia per reports, though no strips available.   Pertinent work up showed Cr up to 2.21 and Echo this admit with EF 25-30% and moderately enlarged and dysfunctional RV with D shaped septum. She was transferred to [facility] for evaluation by CHF team and EP.   Here, Her HRs have been 70-100s with what appears to be SVT / slow atrial tach. On IV amio.   She remains markedly volume overloaded with CVP 19-20 per CHF note.   She is feeling better. Symptoms started just earlier this week with abdominal pain and distention. Denies SOB at rest. K 4.0 and Cr 1.82 today.   Past Medical History:  Diagnosis Date   CHF (congestive heart failure) ([place])    a. EF 45% by echo in 03/2020 with normal cors by cath   Diabetes mellitus without complication ([place])    Diarrhea    Herpes simplex infection    Hyperlipidemia    Hypertension      Surgical History:  Past Surgical History:  Procedure Laterality Date   CESAREAN SECTION     CHOLECYSTECTOMY     COLONOSCOPY N/A 08/26/2014   Procedure: COLONOSCOPY;  Surgeon: [name], MD;  Location: [facility]  ENDO SUITE;  Service: Endoscopy;  Laterality: N/A;  9:30 AM - moved to 8:30 - [place] to notify pt   LEFT HEART CATH AND CORONARY ANGIOGRAPHY N/A 04/14/2020   Procedure: LEFT HEART CATH AND CORONARY ANGIOGRAPHY;  Surgeon: [name], [name], MD;  Location: [facility];  Service: Cardiovascular;  Laterality: N/A;     Medications Prior to Admission  Medication Sig Dispense Refill Last Dose   albuterol (VENTOLIN HFA) 108 (90 Base) MCG/ACT inhaler Inhale 1-2 puffs into the lungs every 4 (four) hours as needed.   02/11/2021 at Unknown time   aspirin 81 MG chewable tablet Chew 1 tablet (81 mg total) by mouth daily. 30 tablet 1 02/11/2021 at Unknown time   atorvastatin (LIPITOR) 80 MG tablet Take 80 mg by mouth daily.   02/11/2021 at Unknown time   carvedilol (COREG) 6.25 MG tablet Take 1 tablet (6.25 mg total) by mouth 2 (two) times daily with a meal. 60 tablet 3 02/11/2021 at 1300   furosemide (LASIX) 40 MG tablet Take 1 tablet (40 mg total) by mouth daily. 30 tablet 3 02/11/2021 at Unknown time   ibuprofen (ADVIL) 600 MG tablet Take 600 mg by mouth every 6 (six) hours as needed.      losartan (COZAAR) 100 MG tablet Take 1 tablet (100 mg total) by mouth daily. 90 tablet 3 02/11/2021 at Unknown time   TRADJENTA 5 MG  TABS tablet Take 5 mg by mouth daily.    02/11/2021 at Unknown time   phenazopyridine (PYRIDIUM) 200 MG tablet Take 1 tablet (200 mg total) by mouth 3 (three) times daily as needed for pain. (Patient not taking: No sig reported) 10 tablet 0 Not Taking at Unknown time   sulfamethoxazole-trimethoprim (BACTRIM DS) 800-160 MG tablet Take 1 tablet by mouth 2 (two) times daily. (Patient not taking: No sig reported) 10 tablet 0 Completed Course at Unknown time    Inpatient Medications:   atorvastatin  80 mg Oral Daily   Chlorhexidine Gluconate Cloth  6 each Topical Daily   insulin aspart  0-5 Units Subcutaneous QHS   insulin aspart  0-9 Units Subcutaneous TID WC   isosorbide-hydrALAZINE  0.5 tablet  Oral TID   pantoprazole  40 mg Oral Daily   sodium chloride flush  10-40 mL Intracatheter Q12H   sodium chloride flush  3 mL Intravenous Q12H   sodium chloride flush  3 mL Intravenous Q12H    Allergies: No Known Allergies  Social History   Socioeconomic History   Marital status: Single    Spouse name: Not on file   Number of children: Not on file   Years of education: Not on file   Highest education level: Not on file  Occupational History   Not on file  Tobacco Use   Smoking status: Never Smoker   Smokeless tobacco: Never Used  Vaping Use   Vaping Use: Never used  Substance and Sexual Activity   Alcohol use: No    Alcohol/week: 0.0 standard drinks   Drug use: No   Sexual activity: Not Currently    Birth control/protection: Post-menopausal  Other Topics Concern   Not on file  Social History Narrative   Not on file   Social Determinants of Health   Financial Resource Strain: Not on file  Food Insecurity: Not on file  Transportation Needs: No Transportation Needs   Lack of Transportation (Medical): No   Lack of Transportation (Non-Medical): No  Physical Activity: Not on file  Stress: Not on file  Social Connections: Not on file  Intimate Partner Violence: Not on file     Family History  Problem Relation Age of Onset   Hypertension Mother    Diabetes Mother    Colon cancer Neg Hx      Review of Systems: All other systems reviewed and are otherwise negative except as noted above.  Physical Exam: Vitals:   02/13/21 2300 02/14/21 0300 02/14/21 0500 02/14/21 0830  BP: 133/83 115/72  (!) 151/99  Pulse: (!) 112 (!) 105  (!) 111  Resp: 20 17  18   Temp: 97.6 F (36.4 C) 98.5 F (36.9 C)  98.1 F (36.7 C)  TempSrc: Oral Oral  Oral  SpO2: 98% 99%  97%  Weight:   80.8 kg   Height:        GEN- The patient is well appearing, alert and oriented x 3 today.   HEENT: normocephalic, atraumatic; sclera clear, conjunctiva pink; hearing intact; oropharynx clear;  neck supple Lungs- Clear to ausculation bilaterally, normal work of breathing.  No wheezes, rales, rhonchi Heart- Regular rate and rhythm, no murmurs, rubs or gallops GI- soft, non-tender, non-distended, bowel sounds present Extremities- no clubbing, cyanosis, or edema; DP/PT/radial pulses 2+ bilaterally MS- no significant deformity or atrophy Skin- warm and dry, no rash or lesion Psych- euthymic mood, full affect Neuro- strength and sensation are intact  Labs:   Lab Results  Component  Value Date   WBC 8.5 02/14/2021   HGB 9.5 (L) 02/14/2021   HCT 29.2 (L) 02/14/2021   MCV 92.1 02/14/2021   PLT 266 02/14/2021    Recent Labs  Lab 02/11/21 1741 02/12/21 0422 02/14/21 0735  NA 138   < > 132*  K 3.4*   < > 4.0  CL 98   < > 97*  CO2 29   < > 25  BUN 18   < > 25*  CREATININE 1.04*   < > 1.82*  CALCIUM 8.3*   < > 7.7*  PROT 7.1  --   --   BILITOT 1.1  --   --   ALKPHOS 76  --   --   ALT 29  --   --   AST 29  --   --   GLUCOSE 128*   < > 289*   < > = values in this interval not displayed.      Radiology/Studies: DG Abd 1 View  Result Date: 02/11/2021 CLINICAL DATA:  60 year old female with nausea and abdominal pain. EXAM: ABDOMEN - 1 VIEW COMPARISON:  None. FINDINGS: Support wires overlie the patient. There is no bowel dilatation or evidence of obstruction. No free air or radiopaque calculi. The osseous structures are intact. Degenerative changes of the spine. IMPRESSION: Negative. Electronically Signed   By: Anner Crete M.D.   On: 02/11/2021 21:03   DG Chest Port 1 View  Result Date: 02/11/2021 CLINICAL DATA:  Shortness of breath EXAM: PORTABLE CHEST 1 VIEW COMPARISON:  06/09/2020 FINDINGS: Cardiomegaly, vascular congestion. No overt edema. Bibasilar opacities, likely atelectasis. No visible significant effusions or pneumothorax. No acute bony abnormality. IMPRESSION: Cardiomegaly, vascular congestion. Bibasilar atelectasis. Electronically Signed   By: Rolm Baptise M.D.    On: 02/11/2021 18:46   ECHOCARDIOGRAM COMPLETE  Result Date: 02/12/2021    ECHOCARDIOGRAM REPORT   Patient Name:   JODEL MAYHALL Date of Exam: 02/12/2021 Medical Rec #:  [id number]      Height:       65.0 in Accession #:    [id number]     Weight:       168.7 lb Date of Birth:  Apr 26, 1961      BSA:          1.840 m Patient Age:    47 years       BP:           75/54 mmHg Patient Gender: F              HR:           42 bpm. Exam Location:  [facility] Procedure: 2D Echo, Cardiac Doppler and Color Doppler Indications:    Dyspnea R06.00  History:        Patient has prior history of Echocardiogram examinations, most                 recent 04/13/2020. CHF, Arrythmias:LBBB; Risk                 Factors:Hypertension, Diabetes, Former Smoker and Dyslipidemia.                 Paroxysmal A-fib/Flutter, Atrial tachycardia, paroxysmal.  Sonographer:    Alvino Chapel RCS Referring Phys: [id number] [place]  1. Global hypokinesis with more prominent anteroseptal hypokinesis. . Left ventricular ejection fraction, by estimation, is 25 to 30%. The left ventricle has severely decreased function. The left ventricle demonstrates global hypokinesis. There is moderate  left ventricular hypertrophy. Left ventricular diastolic parameters are indeterminate.  2. Right ventricular systolic function moderately to severely reduced. The right ventricular size is severely enlarged. There is moderately elevated pulmonary artery systolic pressure.  3. Left atrial size was severely dilated.  4. Right atrial size was severely dilated.  5. The mitral valve is normal in structure. No evidence of mitral valve regurgitation. No evidence of mitral stenosis.  6. By color TR looks moderate. Hepatic systolic flow reversal would suggest more severe TR. . The tricuspid valve is abnormal. Tricuspid valve regurgitation is moderate to severe.  7. The aortic valve has an indeterminant number of cusps. Aortic valve regurgitation is not visualized.  No aortic stenosis is present.  8. The inferior vena cava is normal in size with <50% respiratory variability, suggesting right atrial pressure of 8 mmHg. FINDINGS  Left Ventricle: Global hypokinesis with more prominent anteroseptal hypokinesis. Left ventricular ejection fraction, by estimation, is 25 to 30%. The left ventricle has severely decreased function. The left ventricle demonstrates global hypokinesis. The  left ventricular internal cavity size was normal in size. There is moderate left ventricular hypertrophy. Left ventricular diastolic parameters are indeterminate. Right Ventricle: The ventricular septum is flattened in systole and diastole consistent with RV pressure and volume overload. The right ventricular size is severely enlarged. Right vetricular wall thickness was not assessed. Right ventricular systolic function moderately to severely reduced. There is moderately elevated pulmonary artery systolic pressure. The tricuspid regurgitant velocity is 2.99 m/s, and with an assumed right atrial pressure of 15 mmHg, the estimated right ventricular systolic pressure is 16.9 mmHg. Left Atrium: Left atrial size was severely dilated. Right Atrium: Right atrial size was severely dilated. Pericardium: There is no evidence of pericardial effusion. Mitral Valve: The mitral valve is normal in structure. No evidence of mitral valve regurgitation. No evidence of mitral valve stenosis. Tricuspid Valve: By color TR looks moderate. Hepatic systolic flow reversal would suggest more severe TR. The tricuspid valve is abnormal. Tricuspid valve regurgitation is moderate to severe. No evidence of tricuspid stenosis. Aortic Valve: The aortic valve has an indeterminant number of cusps. Aortic valve regurgitation is not visualized. No aortic stenosis is present. Aortic valve mean gradient measures 2.5 mmHg. Aortic valve peak gradient measures 4.4 mmHg. Aortic valve area, by VTI measures 2.03 cm. Pulmonic Valve: The pulmonic  valve was not well visualized. Pulmonic valve regurgitation is not visualized. No evidence of pulmonic stenosis. Aorta: The aortic root is normal in size and structure. Pulmonary Artery: Moderate pulmonary HTN, PASP is 44 mmHg. Venous: The inferior vena cava is normal in size with less than 50% respiratory variability, suggesting right atrial pressure of 8 mmHg. IAS/Shunts: No atrial level shunt detected by color flow Doppler.  LEFT VENTRICLE PLAX 2D LVIDd:         4.40 cm LVIDs:         3.80 cm LV PW:         1.40 cm LV IVS:        1.30 cm LVOT diam:     1.90 cm LV SV:         41 LV SV Index:   22 LVOT Area:     2.84 cm  RIGHT VENTRICLE TAPSE (M-mode): 1.4 cm LEFT ATRIUM             Index       RIGHT ATRIUM           Index LA diam:  4.40 cm 2.39 cm/m  RA Area:     22.80 cm LA Vol (A2C):   84.2 ml 45.76 ml/m RA Volume:   74.60 ml  40.55 ml/m LA Vol (A4C):   63.7 ml 34.62 ml/m LA Biplane Vol: 74.0 ml 40.22 ml/m  AORTIC VALVE AV Area (Vmax):    2.20 cm AV Area (Vmean):   2.18 cm AV Area (VTI):     2.03 cm AV Vmax:           105.20 cm/s AV Vmean:          73.642 cm/s AV VTI:            0.202 m AV Peak Grad:      4.4 mmHg AV Mean Grad:      2.5 mmHg LVOT Vmax:         81.60 cm/s LVOT Vmean:        56.500 cm/s LVOT VTI:          0.145 m LVOT/AV VTI ratio: 0.72  AORTA Ao Root diam: 2.60 cm MITRAL VALVE                TRICUSPID VALVE MV Area (PHT): 6.60 cm     TR Peak grad:   35.8 mmHg MV Decel Time: 115 msec     TR Vmax:        299.00 cm/s MV E velocity: 108.00 cm/s                             SHUNTS                             Systemic VTI:  0.14 m                             Systemic Diam: 1.90 cm Carlyle Dolly MD Electronically signed by Carlyle Dolly MD Signature Date/Time: 02/12/2021/3:43:57 PM    Final    [place] EKG SITE RITE  Result Date: 02/12/2021 If Site Rite image not attached, placement could not be confirmed due to current cardiac rhythm.   EKG: on arrival to [facility] showed SVT/Atrial tach  in 140s and short termination pause (personally reviewed) EKG today shows Sinus/Atrial tach at 109 bpm  TELEMETRY: NSR 70s -> AT/SVT 90-100s (personally reviewed)  Personally reviewed "CV strips" no strips of bradycardia available  Assessment/Plan: 1.  SVT/Atrial tach In setting of acute illness / biventricular heart failure Continue amiodarone IV for now.  Tiffane Sheldon likely continue to improve with management of her HF.  Would follow for true fib/flutter, but at this point she does NOT require anticoagulation.  Additionally, 30 day monitor 04/2020 showed runs of AT with postconversion pauses of up to 1.4 seconds. Likely this just exacerbated by her significant HF exacerbation.   2. 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  3. ? Junctional brady Unfortunately, we have no viewable strips of this.  Her HR has stabilized and now in 70-100 range (both sinus and AT noted).  We Mashelle Busick follow for watchful waiting for further bradycardia or significant pauses.   For questions or updates, please contact [place] Please consult [email or web address] for contact info under Cardiology/STEMI.  Signed, Shirley Friar, PA-C  02/14/2021 10:14 AM      I have seen and examined this patient  with Oda Kilts.  Agree with above, note added to reflect my findings.  On exam, tachycardic, no murmurs.  Patient presented to the hospital with shortness of breath, found to be tachycardic and what appears to be an atrial tachycardia.  She also has acute on chronic systolic heart failure.  Her CVP is 18.  She is currently on amiodarone IV.  At her initial hospital, there are reports of postconversion pauses when she goes back into sinus rhythm.  In review of telemetry, I do not see any further postconversion pauses.  At this point, would hold off on pacemaker implant.  We Orelia Brandstetter continue with IV diuresis.  If she gets diuresed, this may make control of her  atrial tachycardia easier.  If she does start having pauses, pacemaker implantation can be revisited.  Jull Harral M. Brantley Naser MD 02/14/2021 11:44 AM

## 2021-02-14 NOTE — Progress Notes (Addendum)
ANTICOAGULATION CONSULT NOTE - Follow Up Consult  Pharmacy Consult for IV Heparin Indication: atrial fibrillation  No Known Allergies  Patient Measurements: Height: 5\' 5"  (165.1 cm) Weight: 80.8 kg (178 lb 2.1 oz) IBW/kg (Calculated) : 57 Heparin Dosing Weight: 72.2 kg  Vital Signs: Temp: 98.3 F (36.8 C) (03/23 1100) Temp Source: Oral (03/23 1100) BP: 132/84 (03/23 1100) Pulse Rate: 107 (03/23 1100)  Labs: Recent Labs    02/11/21 1741 02/12/21 0422 02/13/21 0441 02/13/21 0724 02/13/21 1250 02/13/21 1658 02/14/21 0208 02/14/21 0735 02/14/21 1300  HGB 11.3* 10.8* 9.7*  --   --   --   --  9.5*  --   HCT 34.6* 33.3* 29.9*  --   --   --   --  29.2*  --   PLT 272 275 292  --   --   --   --  266  --   APTT  --   --  188*   < >  --  28 >200*  --  132*  HEPARINUNFRC  --   --  >2.20*  --   --   --  1.98*  --   --   CREATININE 1.04* 0.89 1.91*  --  2.21*  --   --  1.82* 1.75*  TROPONINIHS 22*  --   --   --   --   --   --   --   --    < > = values in this interval not displayed.    Estimated Creatinine Clearance: 36.3 mL/min (A) (by C-G formula based on SCr of 1.75 mg/dL (H)).  Assessment: Pharmacy was consulted to dose IV heparin in this 60 yr old female patient with atrial fibrillation. Patient started on apixaban during this hospitalization, with last dose given at 0938 AM on 02/12/21. Given recent apixaban use, will monitor anticoagulation using aPTT until aPTT and heparin levels correlate.    aPTT 132 sec (supratherapeutic). aPTT was drawn from central line while heparin was infusing into peripheral vein. Pt continues with hematuria - now improving. No infusion issues.   Goal of Therapy:  Heparin level 0.3-0.7 units/ml aPTT 66-102 sec Will aim for lower end of goal ranges, due to hematuria Monitor platelets by anticoagulation protocol: Yes   Plan:  Hold heparin x 1 hour Restart heparin at 750 units/hr Check aPTT in 8 hrs Monitor daily HL/aPTT until correlate, CBC,  and s/sx of bleeding   Antonietta Jewel, PharmD, [place] Clinical Pharmacist  Phone: [phone] 02/14/2021 2:08 PM  Please check AMION for all [place] phone numbers After 10:00 PM, call [place] [phone]

## 2021-02-14 NOTE — Progress Notes (Signed)
ANTICOAGULATION CONSULT NOTE - Follow Up Consult  Pharmacy Consult for IV Heparin Indication: atrial fibrillation  No Known Allergies  Patient Measurements: Height: 5\' 5"  (165.1 cm) Weight: 76.5 kg (168 lb 10.4 oz) IBW/kg (Calculated) : 57 Heparin Dosing Weight: 72.2 kg  Vital Signs: Temp: 98.5 F (36.9 C) (03/23 0300) Temp Source: Oral (03/23 0300) BP: 115/72 (03/23 0300) Pulse Rate: 105 (03/23 0300)  Labs: Recent Labs    02/11/21 1741 02/12/21 0422 02/13/21 0441 02/13/21 0441 02/13/21 0724 02/13/21 1250 02/13/21 1658 02/14/21 0208  HGB 11.3* 10.8* 9.7*  --   --   --   --   --   HCT 34.6* 33.3* 29.9*  --   --   --   --   --   PLT 272 275 292  --   --   --   --   --   APTT  --   --  188*   < > >200*  --  28 >200*  HEPARINUNFRC  --   --  >2.20*  --   --   --   --  1.98*  CREATININE 1.04* 0.89 1.91*  --   --  2.21*  --   --   TROPONINIHS 22*  --   --   --   --   --   --   --    < > = values in this interval not displayed.    Estimated Creatinine Clearance: 28 mL/min (A) (by C-G formula based on SCr of 2.21 mg/dL (H)).  Assessment: Pharmacy was consulted to dose IV heparin in this 60 yr old female patient with atrial fibrillation. Patient started on apixaban during this hospitalization, with last dose given at 0938 AM on 02/12/21. Given recent apixaban use, will monitor anticoagulation using aPTT until aPTT and heparin levels correlate.    HL 1.98 (high due to apixaban effects), aPTT >200 sec (supratherapeutic). aPTT was drawn from central line while heparin was infusing into peripheral vein. Pt continues with hematuria - not worsening.  Goal of Therapy:  Heparin level 0.3-0.7 units/ml aPTT 66-102 sec Will aim for lower end of goal ranges, due to hematuria Monitor platelets by anticoagulation protocol: Yes   Plan:  Hold heparin x 1 hour Restart heparin at 900 units/hr Check aPTT in 8 hrs  Sherlon Handing, PharmD, BCPS Please see amion for complete clinical  pharmacist phone list 02/14/2021 3:46 AM

## 2021-02-14 NOTE — Progress Notes (Signed)
ANTICOAGULATION CONSULT NOTE - Follow Up Consult  Pharmacy Consult for IV Heparin Indication: atrial fibrillation  No Known Allergies  Patient Measurements: Height: 5\' 5"  (165.1 cm) Weight: 80.8 kg (178 lb 2.1 oz) IBW/kg (Calculated) : 57 Heparin Dosing Weight: 72.2 kg  Vital Signs: Temp: 98.1 F (36.7 C) (03/23 1931) Temp Source: Oral (03/23 1931) BP: 130/76 (03/23 1931) Pulse Rate: 115 (03/23 1931)  Labs: Recent Labs    02/12/21 0422 02/13/21 0441 02/13/21 0724 02/13/21 1250 02/13/21 1658 02/14/21 0208 02/14/21 0735 02/14/21 1300 02/14/21 2155  HGB 10.8* 9.7*  --   --   --   --  9.5*  --   --   HCT 33.3* 29.9*  --   --   --   --  29.2*  --   --   PLT 275 292  --   --   --   --  266  --   --   APTT  --  188*   < >  --    < > >200*  --  132* 77*  HEPARINUNFRC  --  >2.20*  --   --   --  1.98*  --   --   --   CREATININE 0.89 1.91*  --  2.21*  --   --  1.82* 1.75*  --    < > = values in this interval not displayed.    Estimated Creatinine Clearance: 36.3 mL/min (A) (by C-G formula based on SCr of 1.75 mg/dL (H)).  Assessment: Pharmacy was consulted to dose IV heparin in this 60 yr old female patient with atrial fibrillation. Patient started on apixaban during this hospitalization, with last dose given at 0938 AM on 02/12/21. Given recent apixaban use, will monitor anticoagulation using aPTT until aPTT and heparin levels correlate.    aPTT 77 sec (therapeutic) on heparin drip 750 uts/hr   Goal of Therapy:  Heparin level 0.3-0.7 units/ml aPTT 66-102 sec Will aim for lower end of goal ranges, due to hematuria Monitor platelets by anticoagulation protocol: Yes   Plan:  Continue heparin at 750 units/hr Monitor daily HL/aPTT until correlate, CBC, and s/sx of bleeding    Bonnita Nasuti Pharm.D. CPP, BCPS Clinical Pharmacist [phone] 02/14/2021 10:34 PM    Please check AMION for all [place] phone numbers After 10:00 PM, call [place] [phone]

## 2021-02-15 ENCOUNTER — Inpatient Hospital Stay (HOSPITAL_COMMUNITY): Payer: Medicare Other

## 2021-02-15 DIAGNOSIS — I5023 Acute on chronic systolic (congestive) heart failure: Secondary | ICD-10-CM

## 2021-02-15 LAB — GLUCOSE, CAPILLARY
Glucose-Capillary: 122 mg/dL — ABNORMAL HIGH (ref 70–99)
Glucose-Capillary: 125 mg/dL — ABNORMAL HIGH (ref 70–99)
Glucose-Capillary: 142 mg/dL — ABNORMAL HIGH (ref 70–99)
Glucose-Capillary: 116 mg/dL — ABNORMAL HIGH (ref 70–99)

## 2021-02-15 LAB — COOXEMETRY PANEL
Carboxyhemoglobin: 1 % (ref 0.5–1.5)
Methemoglobin: 1.3 % (ref 0.0–1.5)
O2 Saturation: 61.3 %
Total hemoglobin: 10.3 g/dL — ABNORMAL LOW (ref 12.0–16.0)

## 2021-02-15 LAB — BASIC METABOLIC PANEL
Anion gap: 6 (ref 5–15)
Anion gap: 11 (ref 5–15)
BUN: 20 mg/dL (ref 6–20)
BUN: 16 mg/dL (ref 6–20)
CO2: 34 mmol/L — ABNORMAL HIGH (ref 22–32)
CO2: 32 mmol/L (ref 22–32)
Calcium: 8.3 mg/dL — ABNORMAL LOW (ref 8.9–10.3)
Calcium: 8.4 mg/dL — ABNORMAL LOW (ref 8.9–10.3)
Chloride: 95 mmol/L — ABNORMAL LOW (ref 98–111)
Chloride: 85 mmol/L — ABNORMAL LOW (ref 98–111)
Creatinine, Ser: 1.42 mg/dL — ABNORMAL HIGH (ref 0.44–1.00)
Creatinine, Ser: 1.23 mg/dL — ABNORMAL HIGH (ref 0.44–1.00)
GFR, Estimated: 43 mL/min — ABNORMAL LOW (ref >60)
GFR, Estimated: 51 mL/min — ABNORMAL LOW (ref >60)
Glucose, Bld: 155 mg/dL — ABNORMAL HIGH (ref 70–99)
Glucose, Bld: 246 mg/dL — ABNORMAL HIGH (ref 70–99)
Potassium: 3.7 mmol/L (ref 3.5–5.1)
Potassium: 3.3 mmol/L — ABNORMAL LOW (ref 3.5–5.1)
Sodium: 135 mmol/L (ref 135–145)
Sodium: 128 mmol/L — ABNORMAL LOW (ref 135–145)

## 2021-02-15 LAB — CBC WITH DIFFERENTIAL/PLATELET
Abs Immature Granulocytes: 0.04 10*3/uL (ref 0.00–0.07)
Basophils Absolute: 0.1 10*3/uL (ref 0.0–0.1)
Basophils Relative: 1 %
Eosinophils Absolute: 0.2 10*3/uL (ref 0.0–0.5)
Eosinophils Relative: 2 %
HCT: 30.8 % — ABNORMAL LOW (ref 36.0–46.0)
Hemoglobin: 10.2 g/dL — ABNORMAL LOW (ref 12.0–15.0)
Immature Granulocytes: 0 %
Lymphocytes Relative: 19 %
Lymphs Abs: 1.9 10*3/uL (ref 0.7–4.0)
MCH: 29.9 pg (ref 26.0–34.0)
MCHC: 33.1 g/dL (ref 30.0–36.0)
MCV: 90.3 fL (ref 80.0–100.0)
Monocytes Absolute: 0.9 10*3/uL (ref 0.1–1.0)
Monocytes Relative: 9 %
Neutro Abs: 6.7 10*3/uL (ref 1.7–7.7)
Neutrophils Relative %: 69 %
Platelets: 280 10*3/uL (ref 150–400)
RBC: 3.41 MIL/uL — ABNORMAL LOW (ref 3.87–5.11)
RDW: 16.3 % — ABNORMAL HIGH (ref 11.5–15.5)
WBC: 9.8 10*3/uL (ref 4.0–10.5)
nRBC: 0 % (ref 0.0–0.2)

## 2021-02-15 LAB — APTT: aPTT: 68 seconds — ABNORMAL HIGH (ref 24–36)

## 2021-02-15 LAB — MAGNESIUM: Magnesium: 1.7 mg/dL (ref 1.7–2.4)

## 2021-02-15 LAB — HEPARIN LEVEL (UNFRACTIONATED): Heparin Unfractionated: 0.67 IU/mL (ref 0.30–0.70)

## 2021-02-15 MED ORDER — MAGNESIUM SULFATE 2 GM/50ML IV SOLN
2.0000 g | Freq: Once | INTRAVENOUS | Status: AC
  Administered 2021-02-15: 2 g via INTRAVENOUS
  Filled 2021-02-15: qty 50

## 2021-02-15 MED ORDER — SPIRONOLACTONE 12.5 MG HALF TABLET
12.5000 mg | ORAL_TABLET | Freq: Every day | ORAL | Status: DC
  Administered 2021-02-15: 12.5 mg via ORAL
  Filled 2021-02-15: qty 1

## 2021-02-15 MED ORDER — HEPARIN SODIUM (PORCINE) 5000 UNIT/ML IJ SOLN
5000.0000 [IU] | Freq: Three times a day (TID) | INTRAMUSCULAR | Status: DC
  Administered 2021-02-15 – 2021-02-19 (×13): 5000 [IU] via SUBCUTANEOUS
  Filled 2021-02-15 (×13): qty 1

## 2021-02-15 MED ORDER — POTASSIUM CHLORIDE CRYS ER 20 MEQ PO TBCR
40.0000 meq | EXTENDED_RELEASE_TABLET | Freq: Four times a day (QID) | ORAL | Status: AC
  Administered 2021-02-15 (×2): 40 meq via ORAL
  Filled 2021-02-15 (×2): qty 2

## 2021-02-15 MED ORDER — POTASSIUM CHLORIDE CRYS ER 20 MEQ PO TBCR
40.0000 meq | EXTENDED_RELEASE_TABLET | Freq: Once | ORAL | Status: AC
  Administered 2021-02-15: 40 meq via ORAL
  Filled 2021-02-15: qty 2

## 2021-02-15 MED ORDER — GADOBUTROL 1 MMOL/ML IV SOLN
10.0000 mL | Freq: Once | INTRAVENOUS | Status: AC | PRN
  Administered 2021-02-15: 10 mL via INTRAVENOUS

## 2021-02-15 NOTE — Progress Notes (Addendum)
ANTICOAGULATION CONSULT NOTE - Follow Up Consult  Pharmacy Consult for IV Heparin Indication: atrial fibrillation  No Known Allergies  Patient Measurements: Height: 5\' 5"  (165.1 cm) Weight: 74.2 kg (163 lb 9.3 oz) IBW/kg (Calculated) : 57 Heparin Dosing Weight: 72.2 kg  Vital Signs: Temp: 98.8 F (37.1 C) (03/24 0322) Temp Source: Oral (03/24 0322) BP: 127/77 (03/24 0322) Pulse Rate: 112 (03/24 0322)  Labs: Recent Labs    02/13/21 0441 02/13/21 0724 02/14/21 0208 02/14/21 0735 02/14/21 1300 02/14/21 2155 02/15/21 0331  HGB 9.7*  --   --  9.5*  --   --  10.2*  HCT 29.9*  --   --  29.2*  --   --  30.8*  PLT 292  --   --  266  --   --  280  APTT 188*   < > >200*  --  132* 77* 68*  HEPARINUNFRC >2.20*  --  1.98*  --   --   --  0.67  CREATININE 1.91*   < >  --  1.82* 1.75* 1.57* 1.42*   < > = values in this interval not displayed.    Estimated Creatinine Clearance: 43 mL/min (A) (by C-G formula based on SCr of 1.42 mg/dL (H)).  Assessment: Pharmacy was consulted to dose IV heparin in this 60 yr old female patient with atrial fibrillation. Patient started on apixaban during this hospitalization, with last dose given at 0938 AM on 02/12/21. Given recent apixaban use, will monitor anticoagulation using aPTT until aPTT and heparin levels correlate.    aPTT 68 sec (therapeutic), heparin level came back at 0.67 (therapeutic), on heparin drip at 750 units/hr. No s/sx of bleeding or infusion issues.   Goal of Therapy:  Heparin level 0.3-0.7 units/ml aPTT 66-102 sec Will aim for lower end of goal ranges, due to hematuria Monitor platelets by anticoagulation protocol: Yes   Plan:  Increase heparin to 800 units/hr to keep in goal range  Monitor daily HL/aPTT until correlate, CBC, and s/sx of bleeding    Antonietta Jewel, PharmD, [place] Pharmacist  Phone: [phone] 02/15/2021 7:31 AM  Please check AMION for all [place] phone numbers After 10:00 PM, call [place] [phone]  ADDENDUM Patient is in atrial tachycardia - on review by EP and HF team, no atrial fib or flutter noted. Was started on apixaban at [facility] - not on prior. Will discontinue anticoagulation and monitor.  Antonietta Jewel, PharmD, [place] Clinical Pharmacist

## 2021-02-15 NOTE — Progress Notes (Signed)
Reviewed tele with Dr. Curt Bears.   Pt continues to go in and out of likely atrial tach, sinus rates 60-70s AT rates 100-110s.   Continue IV amiodarone while diuresing.   Can re-challenge with beta blocker once stable to do some from a HF exacerbation perspective. Will monitor closely for bradycardia.   Hopefully rate control will continue to improve as she diureses.   Legrand Como [address] [place], PA-C  02/15/2021 8:02 AM

## 2021-02-15 NOTE — Progress Notes (Addendum)
Advanced Heart Failure Rounding Note  PCP-Cardiologist: Carlyle Dolly, MD   Subjective:   Remains on amio drip + heparin drip.   CO-OX 61%.   Diuresing with lasix drip. Negative >5 liters. Creatinine 1.7>1.5>1.4  Feeling better today. Denies sob.    Objective:   Weight Range: 74.2 kg Body mass index is 27.22 kg/m.   Vital Signs:   Temp:  [98.1 F (36.7 C)-98.8 F (37.1 C)] 98.8 F (37.1 C) (03/24 0322) Pulse Rate:  [107-120] 112 (03/24 0322) Resp:  [16-18] 16 (03/24 0322) BP: (111-151)/(75-99) 127/77 (03/24 0322) SpO2:  [91 %-97 %] 97 % (03/24 0322) Weight:  [74.2 kg] 74.2 kg (03/24 0458) Last BM Date: 02/12/21  Weight change: Filed Weights   02/12/21 0421 02/14/21 0500 02/15/21 0458  Weight: 76.5 kg 80.8 kg 74.2 kg    Intake/Output:   Intake/Output Summary (Last 24 hours) at 02/15/2021 0718 Last data filed at 02/15/2021 0335 Gross per 24 hour  Intake 1865.7 ml  Output 7400 ml  Net -5534.3 ml      Physical Exam   CVP 9-10 personally checked General:  Well appearing. No resp difficulty HEENT: Normal Neck: Supple. JVP 9-10  . Carotids 2+ bilat; no bruits. No lymphadenopathy or thyromegaly appreciated. Cor: PMI nondisplaced. Regular rate & rhythm. No rubs, gallops or murmurs. Lungs: Clear Abdomen: Soft, nontender, nondistended. No hepatosplenomegaly. No bruits or masses. Good bowel sounds. Extremities: No cyanosis, clubbing, rash, edema. RUE PICC Neuro: Alert & orientedx3, cranial nerves grossly intact. moves all 4 extremities w/o difficulty. Affect pleasant   Telemetry   AT this morning overnight had SB with rate in 60s.   EKG   n/a  Labs    CBC Recent Labs    02/14/21 0735 02/15/21 0331  WBC 8.5 9.8  NEUTROABS 6.3 6.7  HGB 9.5* 10.2*  HCT 29.2* 30.8*  MCV 92.1 90.3  PLT 266 287   Basic Metabolic Panel Recent Labs    02/14/21 0735 02/14/21 1300 02/14/21 2155 02/15/21 0331  NA 132*   < > 137 135  K 4.0   < > 3.7 3.7  CL  97*   < > 96* 95*  CO2 25   < > 31 34*  GLUCOSE 289*   < > 201* 155*  BUN 25*   < > 24* 20  CREATININE 1.82*   < > 1.57* 1.42*  CALCIUM 7.7*   < > 8.3* 8.3*  MG 2.4  --   --  1.7   < > = values in this interval not displayed.   Liver Function Tests No results for input(s): AST, ALT, ALKPHOS, BILITOT, PROT, ALBUMIN in the last 72 hours. No results for input(s): LIPASE, AMYLASE in the last 72 hours. Cardiac Enzymes No results for input(s): CKTOTAL, CKMB, CKMBINDEX, TROPONINI in the last 72 hours.  BNP: BNP (last 3 results) Recent Labs    04/13/20 0053 06/09/20 0358 02/11/21 2130  BNP 364.0* 260.0* 1,760.0*    ProBNP (last 3 results) No results for input(s): PROBNP in the last 8760 hours.   D-Dimer No results for input(s): DDIMER in the last 72 hours. Hemoglobin A1C No results for input(s): HGBA1C in the last 72 hours. Fasting Lipid Panel No results for input(s): CHOL, HDL, LDLCALC, TRIG, CHOLHDL, LDLDIRECT in the last 72 hours. Thyroid Function Tests No results for input(s): TSH, T4TOTAL, T3FREE, THYROIDAB in the last 72 hours.  Invalid input(s): FREET3  Other results:   Imaging     No  results found.   Medications:     Scheduled Medications: . atorvastatin  80 mg Oral Daily  . Chlorhexidine Gluconate Cloth  6 each Topical Daily  . insulin aspart  0-5 Units Subcutaneous QHS  . insulin aspart  0-9 Units Subcutaneous TID WC  . isosorbide-hydrALAZINE  0.5 tablet Oral TID  . pantoprazole  40 mg Oral Daily  . sodium chloride flush  10-40 mL Intracatheter Q12H  . sodium chloride flush  3 mL Intravenous Q12H  . sodium chloride flush  3 mL Intravenous Q12H     Infusions: . sodium chloride    . amiodarone 30 mg/hr (02/15/21 0322)  . furosemide (LASIX) 200 mg in dextrose 5% 100 mL (2mg /mL) infusion 10 mg/hr (02/15/21 0659)  . heparin 750 Units/hr (02/15/21 0322)     PRN Medications:  sodium chloride, acetaminophen **OR** acetaminophen, albuterol,  bisacodyl, ondansetron **OR** ondansetron (ZOFRAN) IV, polyethylene glycol, sodium chloride flush, sodium chloride flush, traZODone   Assessment/Plan  1. Acute on chronic systolic CHF:  Nonischemic cardiomyopathy based on 5/21 cath but echo at that time showed EF higher at 45%.  She was admitted 3/20 with volume overload and AFL versus AT with HR in 150s.  Echo this admission with EF 25-30%, moderate RV enlargement and moderately decreased RV systolic function with D-shaped septum, biatrial enlargement. Initial hypotension and AKI in setting of tachycardia.   -CO-OX stable 61%.  - Volume status improving. Brisk diuresis noted. CVP 9-10 today. Continue lasix drip one more day. Renal function looks better today.  - BP stable, add Bidil 0.5 tab tid.  - Add 12.5 mg spiro daily. Check BMET in am.  - Maintain NSR with amiodarone gtt.  - Set up cath for tomorrow.  - Once diuresed, will also arrange for cardiac MRI for cardiomyopathy.  2. Rhythm: Atypical atrial flutter versus atrial tachycardia.  Also with post-termination pauses and episodes of junctional brady apparently at [facility]. Currently in NSR on amiodarone gtt.  -EP consulted---> Atrial Tach. No plan for pacemaker at this time. Plan to watch and see with HF management. Continue amiodarone gtt + heparin gtt.  3. AKI: Baseline creatinine around 1.  Suspect AKI due to tachycardia/hypotension at [facility].  Creatinine as high as 2.2, now 1.4.  Hopefully will continue to trend down as we diurese if she stays in NSR.  4. LBBB: has at baseline, QRS around 130 msec  Consult cardiac rehab.   Length of Stay: 3  Amy Clegg, NP  02/15/2021, 7:18 AM  Advanced Heart Failure Team Pager [phone] (M-F; 7a - 5p)  Please contact [facility] for night-coverage after hours (5p -7a ) and weekends on [email or web address]  Patient seen with NP, agree with the above note.   She diuresed very well yesterday, weight down.  CVP around 10.  Creatinine down to 1.4.  Still in  and out of atrial tachycardia rate 100s (will transition down to NSR in 70s). Feeling better.   General: NAD Neck: JVP 10 cm, no thyromegaly or thyroid nodule.  Lungs: Clear to auscultation bilaterally with normal respiratory effort. CV: Nondisplaced PMI.  Heart regular, tachy S1/S2, no S3/S4, no murmur.  No peripheral edema.  Abdomen: Soft, nontender, no hepatosplenomegaly, no distention.  Skin: Intact without lesions or rashes.  Neurologic: Alert and oriented x 3.  Psych: Normal affect. Extremities: No clubbing or cyanosis.  HEENT: Normal.   Patient is in atrial tachycardia with HR 100s alternating with NSR in 70s.  Continue amiodarone gtt, can stop heparin as  no atrial fib or flutter has been seen.  No bradycardic events seen so far while in this hospital.   Continue current Lasix gtt today, probably transition to po tomorrow.  Creatinine coming down.  Continue Bidil and add spironolactone 12.5 mg daily.   Will plan for LHC/RHC with worsening cardiomyopathy and CHF, likely on Monday.  Think we can go ahead and get cardiac MRI.   Loralie Champagne 02/15/2021 8:13 AM

## 2021-02-15 NOTE — Plan of Care (Signed)

## 2021-02-16 DIAGNOSIS — I5023 Acute on chronic systolic (congestive) heart failure: Secondary | ICD-10-CM | POA: Diagnosis not present

## 2021-02-16 LAB — CBC WITH DIFFERENTIAL/PLATELET
Abs Immature Granulocytes: 0.05 10*3/uL (ref 0.00–0.07)
Basophils Absolute: 0.1 10*3/uL (ref 0.0–0.1)
Basophils Relative: 1 %
Eosinophils Absolute: 0.2 10*3/uL (ref 0.0–0.5)
Eosinophils Relative: 2 %
HCT: 33.6 % — ABNORMAL LOW (ref 36.0–46.0)
Hemoglobin: 11.6 g/dL — ABNORMAL LOW (ref 12.0–15.0)
Immature Granulocytes: 1 %
Lymphocytes Relative: 16 %
Lymphs Abs: 1.6 10*3/uL (ref 0.7–4.0)
MCH: 30.4 pg (ref 26.0–34.0)
MCHC: 34.5 g/dL (ref 30.0–36.0)
MCV: 88 fL (ref 80.0–100.0)
Monocytes Absolute: 0.9 10*3/uL (ref 0.1–1.0)
Monocytes Relative: 9 %
Neutro Abs: 7.4 10*3/uL (ref 1.7–7.7)
Neutrophils Relative %: 71 %
Platelets: 307 10*3/uL (ref 150–400)
RBC: 3.82 MIL/uL — ABNORMAL LOW (ref 3.87–5.11)
RDW: 15.9 % — ABNORMAL HIGH (ref 11.5–15.5)
WBC: 10.2 10*3/uL (ref 4.0–10.5)
nRBC: 0 % (ref 0.0–0.2)

## 2021-02-16 LAB — COOXEMETRY PANEL
Carboxyhemoglobin: 0.8 % (ref 0.5–1.5)
Methemoglobin: 1.1 % (ref 0.0–1.5)
O2 Saturation: 55.6 %
Total hemoglobin: 11.3 g/dL — ABNORMAL LOW (ref 12.0–16.0)

## 2021-02-16 LAB — GLUCOSE, CAPILLARY
Glucose-Capillary: 130 mg/dL — ABNORMAL HIGH (ref 70–99)
Glucose-Capillary: 128 mg/dL — ABNORMAL HIGH (ref 70–99)
Glucose-Capillary: 118 mg/dL — ABNORMAL HIGH (ref 70–99)
Glucose-Capillary: 127 mg/dL — ABNORMAL HIGH (ref 70–99)

## 2021-02-16 LAB — BASIC METABOLIC PANEL
Anion gap: 11 (ref 5–15)
BUN: 14 mg/dL (ref 6–20)
CO2: 31 mmol/L (ref 22–32)
Calcium: 9 mg/dL (ref 8.9–10.3)
Chloride: 87 mmol/L — ABNORMAL LOW (ref 98–111)
Creatinine, Ser: 1.3 mg/dL — ABNORMAL HIGH (ref 0.44–1.00)
GFR, Estimated: 47 mL/min — ABNORMAL LOW (ref >60)
Glucose, Bld: 292 mg/dL — ABNORMAL HIGH (ref 70–99)
Potassium: 4.6 mmol/L (ref 3.5–5.1)
Sodium: 129 mmol/L — ABNORMAL LOW (ref 135–145)

## 2021-02-16 LAB — MAGNESIUM: Magnesium: 1.7 mg/dL (ref 1.7–2.4)

## 2021-02-16 MED ORDER — MAGNESIUM SULFATE 4 GM/100ML IV SOLN
4.0000 g | Freq: Once | INTRAVENOUS | Status: AC
  Administered 2021-02-16: 4 g via INTRAVENOUS
  Filled 2021-02-16: qty 100

## 2021-02-16 MED ORDER — SODIUM CHLORIDE 0.9% FLUSH: 3.0000 mL | Freq: Two times a day (BID) | INTRAVENOUS | Status: DC

## 2021-02-16 MED ORDER — DAPAGLIFLOZIN PROPANEDIOL 10 MG PO TABS
10.0000 mg | ORAL_TABLET | Freq: Every day | ORAL | Status: DC
  Administered 2021-02-16 – 2021-02-20 (×5): 10 mg via ORAL
  Filled 2021-02-16 (×6): qty 1

## 2021-02-16 MED ORDER — SPIRONOLACTONE 25 MG PO TABS
25.0000 mg | ORAL_TABLET | Freq: Every day | ORAL | Status: DC
  Administered 2021-02-16 – 2021-02-18 (×3): 25 mg via ORAL
  Filled 2021-02-16 (×3): qty 1

## 2021-02-16 MED ORDER — FUROSEMIDE 40 MG PO TABS: 40.0000 mg | ORAL_TABLET | Freq: Every day | ORAL | Status: DC

## 2021-02-16 MED ORDER — SACUBITRIL-VALSARTAN 24-26 MG PO TABS
1.0000 | ORAL_TABLET | Freq: Two times a day (BID) | ORAL | Status: DC
  Administered 2021-02-16 – 2021-02-20 (×9): 1 via ORAL
  Filled 2021-02-16 (×9): qty 1

## 2021-02-16 MED ORDER — TORSEMIDE 20 MG PO TABS
20.0000 mg | ORAL_TABLET | Freq: Every day | ORAL | Status: DC
  Administered 2021-02-16 – 2021-02-17 (×2): 20 mg via ORAL
  Filled 2021-02-16 (×2): qty 1

## 2021-02-16 NOTE — Progress Notes (Signed)
CARDIAC REHAB PHASE I   PRE:  Rate/Rhythm: 65 SR  BP:  Supine:   Sitting: 124/66  Standing:    SaO2: 93%RA  MODE:  Ambulation: 200 ft   POST:  Rate/Rhythm: 75 SR  BP:  Supine:   Sitting: 117/59  Standing:    SaO2: 94%RA [id number] Second walk today. Pt walked 200 ft on RA with rolling walker, gait belt use, and asst x 1. Tolerated well. Back to recliner with call bell. Gave pt CHF booklet. Pt stated she had seen it before. Discussed daily weights and when to call MD with signs/symptoms. Gave low sodium diets and discussed 2000 mg sodium restriction. Pt voiced understanding.   Graylon Good, RN BSN  02/16/2021 1:49 PM

## 2021-02-16 NOTE — Care Management Important Message (Signed)
Important Message  Patient Details  Name: Michelle Morrison MRN: [id number] Date of Birth: December 09, 1960   Medicare Important Message Given:  Yes     [name] 02/16/2021, 3:02 PM

## 2021-02-16 NOTE — Plan of Care (Signed)

## 2021-02-16 NOTE — Progress Notes (Addendum)
Advanced Heart Failure Rounding Note  PCP-Cardiologist: Carlyle Dolly, MD   Subjective:   Remains on amio drip.   CO-OX 56%.    Diuresing with lasix drip. Negative >3.4 liters. Weight down another 6 pounds. CVP 8  Creatinine 1.7>1.5>1.4>1.3   CMRI -  1. Normal LV size with EF 20%, diffuse hypokinesis with septal-lateral dyssynchrony c/w LBBB. 2.  Normal RV size with EF 26%. 3. Diffuse delayed enhancement imaging, however there does appear to be LGE in the septum that could be in a coronary disease-type pattern.  Feeling ok. Denies SOB.    Objective:   Weight Range: 71.4 kg Body mass index is 26.19 kg/m.   Vital Signs:   Temp:  [97.6 F (36.4 C)-98.3 F (36.8 C)] 98.1 F (36.7 C) (03/25 0330) Pulse Rate:  [108-118] 108 (03/25 0330) Resp:  [17-25] 17 (03/25 0330) BP: (116-159)/(73-98) 116/82 (03/25 0330) SpO2:  [91 %-99 %] 98 % (03/25 0330) Weight:  [71.4 kg] 71.4 kg (03/25 0330) Last BM Date: 02/12/21  Weight change: Filed Weights   02/14/21 0500 02/15/21 0458 02/16/21 0330  Weight: 80.8 kg 74.2 kg 71.4 kg    Intake/Output:   Intake/Output Summary (Last 24 hours) at 02/16/2021 0759 Last data filed at 02/16/2021 0330 Gross per 24 hour  Intake 1336.85 ml  Output 3750 ml  Net -2413.15 ml      Physical Exam   CVP 8 General:   No resp difficulty HEENT: normal Neck: supple. JVP 7-8-. Carotids 2+ bilat; no bruits. No lymphadenopathy or thryomegaly appreciated. Cor: PMI nondisplaced. Regular rate & rhythm. No rubs, gallops or murmurs. Lungs: clear Abdomen: soft, nontender, nondistended. No hepatosplenomegaly. No bruits or masses. Good bowel sounds. Extremities: no cyanosis, clubbing, rash, edema Neuro: alert & orientedx3, cranial nerves grossly intact. moves all 4 extremities w/o difficulty. Affect pleasant   Telemetry  Overnight AT 100s then dropped back down to the 60s. Earlier this morning.   EKG   n/a  Labs    CBC Recent Labs     02/15/21 0331 02/16/21 0104  WBC 9.8 10.2  NEUTROABS 6.7 7.4  HGB 10.2* 11.6*  HCT 30.8* 33.6*  MCV 90.3 88.0  PLT 280 606   Basic Metabolic Panel Recent Labs    02/15/21 0331 02/15/21 1400 02/16/21 0104  NA 135 128* 129*  K 3.7 3.3* 4.6  CL 95* 85* 87*  CO2 34* 32 31  GLUCOSE 155* 246* 292*  BUN 20 16 14   CREATININE 1.42* 1.23* 1.30*  CALCIUM 8.3* 8.4* 9.0  MG 1.7  --  1.7   Liver Function Tests No results for input(s): AST, ALT, ALKPHOS, BILITOT, PROT, ALBUMIN in the last 72 hours. No results for input(s): LIPASE, AMYLASE in the last 72 hours. Cardiac Enzymes No results for input(s): CKTOTAL, CKMB, CKMBINDEX, TROPONINI in the last 72 hours.  BNP: BNP (last 3 results) Recent Labs    04/13/20 0053 06/09/20 0358 02/11/21 2130  BNP 364.0* 260.0* 1,760.0*    ProBNP (last 3 results) No results for input(s): PROBNP in the last 8760 hours.   D-Dimer No results for input(s): DDIMER in the last 72 hours. Hemoglobin A1C No results for input(s): HGBA1C in the last 72 hours. Fasting Lipid Panel No results for input(s): CHOL, HDL, LDLCALC, TRIG, CHOLHDL, LDLDIRECT in the last 72 hours. Thyroid Function Tests No results for input(s): TSH, T4TOTAL, T3FREE, THYROIDAB in the last 72 hours.  Invalid input(s): FREET3  Other results:   Imaging  MR CARDIAC MORPHOLOGY W WO CONTRAST  Result Date: 02/15/2021 CLINICAL DATA:  Cardiomyopathy of uncertain etiology EXAM: CARDIAC MRI TECHNIQUE: The patient was scanned on a 1.5 Tesla GE magnet. A dedicated cardiac coil was used. Functional imaging was done using Fiesta sequences. 2,3, and 4 chamber views were done to assess for RWMA's. Modified Simpson's rule using a short axis stack was used to calculate an ejection fraction on a dedicated work Conservation officer, nature. The patient received 7 cc of Gadavist. After 10 minutes inversion recovery sequences were used to assess for infiltration and scar tissue. FINDINGS: Limited  images of the lung fields showed no gross abnormalities. Normal left ventricular size with normal wall thickness. Septal-lateral dyssynchrony consistent with LBBB. Diffuse hypokinesis, EF 20%. Normal right ventricular size with RV EF 26%. Mild left atrial enlargement, normal right atrium. Probably trivial mitral regurgitation. Tricuspid aortic valve, no evidence for significant stenosis or regurgitation. Very difficult delayed enhancement images. There does appear to be < 50% wall thickness subendocardial late gadolinium enhancement in the mid anteroseptal, mid inferoseptal, and apical septal wall segments. Measurements: LVEDV 165 mL LVSV 34 mL LVEF 20% RVEDV 133 mL RVSV 35 mL RVEF 26% IMPRESSION: 1. Normal LV size with EF 20%, diffuse hypokinesis with septal-lateral dyssynchrony c/w LBBB. 2.  Normal RV size with EF 26%. 3. Diffuse delayed enhancement imaging, however there does appear to be LGE in the septum that could be in a coronary disease-type pattern. Boy Delamater Electronically Signed   By: Loralie Champagne M.D.   On: 02/15/2021 17:12     Medications:     Scheduled Medications: . atorvastatin  80 mg Oral Daily  . Chlorhexidine Gluconate Cloth  6 each Topical Daily  . heparin  5,000 Units Subcutaneous Q8H  . insulin aspart  0-5 Units Subcutaneous QHS  . insulin aspart  0-9 Units Subcutaneous TID WC  . isosorbide-hydrALAZINE  0.5 tablet Oral TID  . pantoprazole  40 mg Oral Daily  . sodium chloride flush  10-40 mL Intracatheter Q12H  . sodium chloride flush  3 mL Intravenous Q12H  . sodium chloride flush  3 mL Intravenous Q12H  . spironolactone  12.5 mg Oral Daily    Infusions: . sodium chloride    . amiodarone 30 mg/hr (02/16/21 0330)  . furosemide (LASIX) 200 mg in dextrose 5% 100 mL (2mg /mL) infusion 10 mg/hr (02/16/21 0330)    PRN Medications: sodium chloride, acetaminophen **OR** acetaminophen, albuterol, bisacodyl, ondansetron **OR** ondansetron (ZOFRAN) IV, polyethylene glycol,  sodium chloride flush, sodium chloride flush, traZODone   Assessment/Plan   1. Acute on chronic systolic CHF:  Nonischemic cardiomyopathy based on 5/21 cath but echo at that time showed EF higher at 45%.  She was admitted 3/20 with volume overload and AFL versus AT with HR in 150s.  Echo this admission with EF 25-30%, moderate RV enlargement and moderately decreased RV systolic function with D-shaped septum, biatrial enlargement. Initial hypotension and AKI in setting of tachycardia.   - CMRI with LV EF 20% RV EF 26%, technically difficult but there was delayed enhancement that could be coronary disease type pattern.  - CO-OX stable 56%.  - CVP 8. Stop lasix drip. Start po lasix 40 mg daily and follow CVP.  - Start farxiga 10 mg daily.   - Bidil 0.5 tab tid.  - Increase spiro 25 mg daily.  - Can start Entresto 24/26 bid. - On AT/SR  with amiodarone gtt.  - Cath set up for Monday 2. Rhythm: Atypical atrial  flutter versus atrial tachycardia.  Also with post-termination pauses and episodes of junctional brady apparently at [facility]. Currently in NSR  on amiodarone gtt.  - EP consulted---> Atrial Tach. No plan for pacemaker at this time.  3. AKI: Baseline creatinine around 1.  Suspect AKI due to tachycardia/hypotension at [facility].  Creatinine as high as 2.2, now 1.3 4. LBBB: has at baseline, QRS around 130 msec  Walk today.   RHC/LHC set up for Monday.    Length of Stay: 4  Amy Clegg, NP  02/16/2021, 7:59 AM  Advanced Heart Failure Team Pager [phone] (M-F; [place])  Please contact [facility] for night-coverage after hours (5p -7a ) and weekends on [email or web address]  Patient seen with NP, agree with the above note.   She diuresed very well again yesterday, weight down.  CVP around 8.  Creatinine down to 1.3.  Co-ox 56%.  This morning, she appears to be in NSR with rate 70s.   General: NAD Neck: No JVD, no thyromegaly or thyroid nodule.  Lungs: Clear to auscultation bilaterally with  normal respiratory effort. CV: Nondisplaced PMI.  Heart regular S1/S2 with paradoxical S2 splitting, no S3/S4, no murmur.  No peripheral edema.  No carotid bruit.  Normal pedal pulses.  Abdomen: Soft, nontender, no hepatosplenomegaly, no distention.  Skin: Intact without lesions or rashes.  Neurologic: Alert and oriented x 3.  Psych: Normal affect. Extremities: No clubbing or cyanosis.  HEENT: Normal.   Patient has been in atrial tachycardia with HR 100s alternating with NSR in 70s, currently in NSR.  Continue amiodarone gtt for now.  No bradycardic events seen so far while in this hospital. ECG this morning.   CVP 8, stop IV Lasix and transition to torsemide 20 mg daily.  Increase spironolactone to 25 mg daily and add Entresto 24/26 bid with [place] 10 daily.   Cardiac MRI was technically difficult study, but showed biventricular failure with possible coronary pattern LGE.  Will plan for LHC/RHC with worsening cardiomyopathy and CHF, likely on Monday.    Loralie Champagne 02/16/2021 8:39 AM

## 2021-02-17 DIAGNOSIS — I5023 Acute on chronic systolic (congestive) heart failure: Secondary | ICD-10-CM | POA: Diagnosis not present

## 2021-02-17 LAB — BASIC METABOLIC PANEL
Anion gap: 11 (ref 5–15)
BUN: 18 mg/dL (ref 6–20)
CO2: 30 mmol/L (ref 22–32)
Calcium: 9.2 mg/dL (ref 8.9–10.3)
Chloride: 89 mmol/L — ABNORMAL LOW (ref 98–111)
Creatinine, Ser: 1.27 mg/dL — ABNORMAL HIGH (ref 0.44–1.00)
GFR, Estimated: 49 mL/min — ABNORMAL LOW (ref >60)
Glucose, Bld: 138 mg/dL — ABNORMAL HIGH (ref 70–99)
Potassium: 4.6 mmol/L (ref 3.5–5.1)
Sodium: 130 mmol/L — ABNORMAL LOW (ref 135–145)

## 2021-02-17 LAB — CBC WITH DIFFERENTIAL/PLATELET
Abs Immature Granulocytes: 0.06 10*3/uL (ref 0.00–0.07)
Basophils Absolute: 0 10*3/uL (ref 0.0–0.1)
Basophils Relative: 0 %
Eosinophils Absolute: 0.1 10*3/uL (ref 0.0–0.5)
Eosinophils Relative: 1 %
HCT: 37.9 % (ref 36.0–46.0)
Hemoglobin: 12.8 g/dL (ref 12.0–15.0)
Immature Granulocytes: 1 %
Lymphocytes Relative: 19 %
Lymphs Abs: 2.1 10*3/uL (ref 0.7–4.0)
MCH: 29.7 pg (ref 26.0–34.0)
MCHC: 33.8 g/dL (ref 30.0–36.0)
MCV: 87.9 fL (ref 80.0–100.0)
Monocytes Absolute: 1.1 10*3/uL — ABNORMAL HIGH (ref 0.1–1.0)
Monocytes Relative: 10 %
Neutro Abs: 7.8 10*3/uL — ABNORMAL HIGH (ref 1.7–7.7)
Neutrophils Relative %: 69 %
Platelets: 358 10*3/uL (ref 150–400)
RBC: 4.31 MIL/uL (ref 3.87–5.11)
RDW: 15.7 % — ABNORMAL HIGH (ref 11.5–15.5)
WBC: 11.2 10*3/uL — ABNORMAL HIGH (ref 4.0–10.5)
nRBC: 0 % (ref 0.0–0.2)

## 2021-02-17 LAB — COOXEMETRY PANEL
Carboxyhemoglobin: 0.8 % (ref 0.5–1.5)
Methemoglobin: 1 % (ref 0.0–1.5)
O2 Saturation: 49.5 %
Total hemoglobin: 12.8 g/dL (ref 12.0–16.0)

## 2021-02-17 LAB — GLUCOSE, CAPILLARY
Glucose-Capillary: 126 mg/dL — ABNORMAL HIGH (ref 70–99)
Glucose-Capillary: 114 mg/dL — ABNORMAL HIGH (ref 70–99)
Glucose-Capillary: 108 mg/dL — ABNORMAL HIGH (ref 70–99)
Glucose-Capillary: 119 mg/dL — ABNORMAL HIGH (ref 70–99)

## 2021-02-17 LAB — MAGNESIUM: Magnesium: 2.8 mg/dL — ABNORMAL HIGH (ref 1.7–2.4)

## 2021-02-17 MED ORDER — AMIODARONE HCL 200 MG PO TABS
200.0000 mg | ORAL_TABLET | Freq: Two times a day (BID) | ORAL | Status: DC
  Administered 2021-02-17 – 2021-02-20 (×7): 200 mg via ORAL
  Filled 2021-02-17 (×7): qty 1

## 2021-02-17 NOTE — Plan of Care (Signed)

## 2021-02-17 NOTE — Progress Notes (Signed)
Pt hr frequently dropping to low 40s. Pt asymptomatic, resting in bed. MD on call notified. Will continue to monitor.

## 2021-02-17 NOTE — Progress Notes (Signed)
Patient ID: Michelle Morrison, female   DOB: [birth year], 60 y.o.   MRN: [id number]      Advanced Heart Failure Rounding Note  PCP-Cardiologist: Carlyle Dolly, MD   Subjective:    Remains on amio drip, in NSR today in 70s. HR dropped transiently to 40s overnight.   CO-OX 64%, CVP 6 today.    Creatinine 1.7>1.5>1.4>1.3>1.27  CMRI -  1. Normal LV size with EF 20%, diffuse hypokinesis with septal-lateral dyssynchrony c/w LBBB. 2.  Normal RV size with EF 26%. 3. Diffuse delayed enhancement imaging, however there does appear to be LGE in the septum that could be in a coronary disease-type pattern.  No dyspnea, walked in hall.    Objective:   Weight Range: 71.6 kg Body mass index is 26.27 kg/m.   Vital Signs:   Temp:  [97.4 F (36.3 C)-98.4 F (36.9 C)] 97.5 F (36.4 C) (03/26 0826) Pulse Rate:  [54-68] 54 (03/26 0826) Resp:  [15-20] 16 (03/26 0826) BP: (104-137)/(59-84) 137/60 (03/26 0826) SpO2:  [65 %-95 %] 65 % (03/26 0826) Weight:  [71.6 kg] 71.6 kg (03/26 0403) Last BM Date: 02/12/21  Weight change: Filed Weights   02/15/21 0458 02/16/21 0330 02/17/21 0403  Weight: 74.2 kg 71.4 kg 71.6 kg    Intake/Output:   Intake/Output Summary (Last 24 hours) at 02/17/2021 1005 Last data filed at 02/17/2021 0735 Gross per 24 hour  Intake 720 ml  Output 750 ml  Net -30 ml      Physical Exam   CVP 6 General: NAD Neck: No JVD, no thyromegaly or thyroid nodule.  Lungs: Clear to auscultation bilaterally with normal respiratory effort. CV: Nondisplaced PMI.  Heart regular S1/S2, no S3/S4, no murmur.  No peripheral edema.   Abdomen: Soft, nontender, no hepatosplenomegaly, no distention.  Skin: Intact without lesions or rashes.  Neurologic: Alert and oriented x 3.  Psych: Normal affect. Extremities: No clubbing or cyanosis.  HEENT: Normal.    Telemetry   NSR 70s (personally reviewed)  EKG   n/a  Labs    CBC Recent Labs    02/16/21 0104 02/17/21 0225   WBC 10.2 11.2*  NEUTROABS 7.4 7.8*  HGB 11.6* 12.8  HCT 33.6* 37.9  MCV 88.0 87.9  PLT 307 443   Basic Metabolic Panel Recent Labs    02/16/21 0104 02/17/21 0225  NA 129* 130*  K 4.6 4.6  CL 87* 89*  CO2 31 30  GLUCOSE 292* 138*  BUN 14 18  CREATININE 1.30* 1.27*  CALCIUM 9.0 9.2  MG 1.7 2.8*   Liver Function Tests No results for input(s): AST, ALT, ALKPHOS, BILITOT, PROT, ALBUMIN in the last 72 hours. No results for input(s): LIPASE, AMYLASE in the last 72 hours. Cardiac Enzymes No results for input(s): CKTOTAL, CKMB, CKMBINDEX, TROPONINI in the last 72 hours.  BNP: BNP (last 3 results) Recent Labs    04/13/20 0053 06/09/20 0358 02/11/21 2130  BNP 364.0* 260.0* 1,760.0*    ProBNP (last 3 results) No results for input(s): PROBNP in the last 8760 hours.   D-Dimer No results for input(s): DDIMER in the last 72 hours. Hemoglobin A1C No results for input(s): HGBA1C in the last 72 hours. Fasting Lipid Panel No results for input(s): CHOL, HDL, LDLCALC, TRIG, CHOLHDL, LDLDIRECT in the last 72 hours. Thyroid Function Tests No results for input(s): TSH, T4TOTAL, T3FREE, THYROIDAB in the last 72 hours.  Invalid input(s): FREET3  Other results:   Imaging    No results found.  Medications:     Scheduled Medications: . amiodarone  200 mg Oral BID  . atorvastatin  80 mg Oral Daily  . Chlorhexidine Gluconate Cloth  6 each Topical Daily  . dapagliflozin propanediol  10 mg Oral Daily  . heparin  5,000 Units Subcutaneous Q8H  . insulin aspart  0-5 Units Subcutaneous QHS  . insulin aspart  0-9 Units Subcutaneous TID WC  . isosorbide-hydrALAZINE  0.5 tablet Oral TID  . pantoprazole  40 mg Oral Daily  . sacubitril-valsartan  1 tablet Oral BID  . sodium chloride flush  10-40 mL Intracatheter Q12H  . sodium chloride flush  3 mL Intravenous Q12H  . spironolactone  25 mg Oral Daily  . torsemide  20 mg Oral Daily    Infusions:   PRN  Medications: acetaminophen **OR** acetaminophen, albuterol, bisacodyl, ondansetron **OR** ondansetron (ZOFRAN) IV, polyethylene glycol, sodium chloride flush, traZODone   Assessment/Plan   1. Acute on chronic systolic CHF:  Nonischemic cardiomyopathy based on 5/21 cath but echo at that time showed EF higher at 45%.  She was admitted 3/20 with volume overload and AFL versus AT with HR in 150s.  Echo this admission with EF 25-30%, moderate RV enlargement and moderately decreased RV systolic function with D-shaped septum, biatrial enlargement. Initial hypotension and AKI in setting of tachycardia.  CMRI with LV EF 20% RV EF 26%, technically difficult but there was delayed enhancement that could be a coronary disease-type pattern. Co-ox 64%, CVP 6 today.  - Continue torsemide 20 mg daily.   -Continue Farxiga 10 mg daily.   - Bidil 0.5 tab tid.  - Continue spiro 25 mg daily.  - Continue Entresto 24/26 bid. - RHC/LHC Monday.  2. Rhythm: Atypical atrial flutter versus atrial tachycardia.  Also with post-termination pauses and episodes of junctional brady apparently at [facility]. Currently in NSR  on amiodarone gtt.  - EP consulted---> Atrial Tach. No plan for pacemaker at this time.  - Stop amiodarone gtt, start po amiodarone.  3. AKI: Baseline creatinine around 1.  Suspect AKI due to tachycardia/hypotension at [facility].  Creatinine as high as 2.2, now 1.27 4. LBBB: has at baseline, QRS around 130 msec  Walk today.   RHC/LHC set up for Monday.    Length of Stay: [place], MD  02/17/2021, 10:05 AM  Advanced Heart Failure Team Pager [phone] (M-F; 7a - 5p)  Please contact [facility] for night-coverage after hours (5p -7a ) and weekends on [email or web address]

## 2021-02-18 DIAGNOSIS — N179 Acute kidney failure, unspecified: Secondary | ICD-10-CM | POA: Diagnosis not present

## 2021-02-18 DIAGNOSIS — I5023 Acute on chronic systolic (congestive) heart failure: Secondary | ICD-10-CM | POA: Diagnosis not present

## 2021-02-18 LAB — CBC WITH DIFFERENTIAL/PLATELET
Abs Immature Granulocytes: 0.05 10*3/uL (ref 0.00–0.07)
Basophils Absolute: 0 10*3/uL (ref 0.0–0.1)
Basophils Relative: 0 %
Eosinophils Absolute: 0.1 10*3/uL (ref 0.0–0.5)
Eosinophils Relative: 1 %
HCT: 36.1 % (ref 36.0–46.0)
Hemoglobin: 12.6 g/dL (ref 12.0–15.0)
Immature Granulocytes: 0 %
Lymphocytes Relative: 15 %
Lymphs Abs: 1.8 10*3/uL (ref 0.7–4.0)
MCH: 30.1 pg (ref 26.0–34.0)
MCHC: 34.9 g/dL (ref 30.0–36.0)
MCV: 86.2 fL (ref 80.0–100.0)
Monocytes Absolute: 1.3 10*3/uL — ABNORMAL HIGH (ref 0.1–1.0)
Monocytes Relative: 11 %
Neutro Abs: 8.3 10*3/uL — ABNORMAL HIGH (ref 1.7–7.7)
Neutrophils Relative %: 73 %
Platelets: 364 10*3/uL (ref 150–400)
RBC: 4.19 MIL/uL (ref 3.87–5.11)
RDW: 15.5 % (ref 11.5–15.5)
WBC: 11.6 10*3/uL — ABNORMAL HIGH (ref 4.0–10.5)
nRBC: 0 % (ref 0.0–0.2)

## 2021-02-18 LAB — GLUCOSE, CAPILLARY
Glucose-Capillary: 122 mg/dL — ABNORMAL HIGH (ref 70–99)
Glucose-Capillary: 140 mg/dL — ABNORMAL HIGH (ref 70–99)
Glucose-Capillary: 112 mg/dL — ABNORMAL HIGH (ref 70–99)
Glucose-Capillary: 123 mg/dL — ABNORMAL HIGH (ref 70–99)

## 2021-02-18 LAB — BASIC METABOLIC PANEL
Anion gap: 10 (ref 5–15)
BUN: 20 mg/dL (ref 6–20)
CO2: 31 mmol/L (ref 22–32)
Calcium: 8.7 mg/dL — ABNORMAL LOW (ref 8.9–10.3)
Chloride: 87 mmol/L — ABNORMAL LOW (ref 98–111)
Creatinine, Ser: 1.45 mg/dL — ABNORMAL HIGH (ref 0.44–1.00)
GFR, Estimated: 42 mL/min — ABNORMAL LOW (ref >60)
Glucose, Bld: 113 mg/dL — ABNORMAL HIGH (ref 70–99)
Potassium: 4.1 mmol/L (ref 3.5–5.1)
Sodium: 128 mmol/L — ABNORMAL LOW (ref 135–145)

## 2021-02-18 LAB — COOXEMETRY PANEL
Carboxyhemoglobin: 1 % (ref 0.5–1.5)
Carboxyhemoglobin: 1.1 % (ref 0.5–1.5)
Methemoglobin: 1.1 % (ref 0.0–1.5)
Methemoglobin: 1.1 % (ref 0.0–1.5)
O2 Saturation: 54.1 %
O2 Saturation: 56.5 %
Total hemoglobin: 12.5 g/dL (ref 12.0–16.0)
Total hemoglobin: 13.2 g/dL (ref 12.0–16.0)

## 2021-02-18 LAB — MAGNESIUM: Magnesium: 2.4 mg/dL (ref 1.7–2.4)

## 2021-02-18 MED ORDER — SODIUM CHLORIDE 0.9 % IV SOLN: 250.0000 mL | INTRAVENOUS | Status: DC | PRN

## 2021-02-18 MED ORDER — ISOSORB DINITRATE-HYDRALAZINE 20-37.5 MG PO TABS
1.0000 | ORAL_TABLET | Freq: Three times a day (TID) | ORAL | Status: DC
  Administered 2021-02-18 – 2021-02-20 (×7): 1 via ORAL
  Filled 2021-02-18 (×7): qty 1

## 2021-02-18 MED ORDER — SODIUM CHLORIDE 0.9 % IV SOLN: INTRAVENOUS | Status: DC

## 2021-02-18 MED ORDER — TORSEMIDE 20 MG PO TABS: 20.0000 mg | ORAL_TABLET | Freq: Every day | ORAL | Status: DC

## 2021-02-18 MED ORDER — SODIUM CHLORIDE 0.9% FLUSH: 3.0000 mL | INTRAVENOUS | Status: DC | PRN

## 2021-02-18 MED ORDER — ASPIRIN 81 MG PO CHEW
81.0000 mg | CHEWABLE_TABLET | ORAL | Status: AC
  Administered 2021-02-19: 81 mg via ORAL
  Filled 2021-02-18: qty 1

## 2021-02-18 NOTE — Progress Notes (Addendum)
Patient ID: Michelle Morrison, female   DOB: September 22, 1961, 60 y.o.   MRN: [id number]      Advanced Heart Failure Rounding Note  PCP-Cardiologist: Carlyle Dolly, MD   Subjective:    NSR today in 60s. SBP 120s.    CO-OX 54%, CVP 6 today.    Creatinine 1.7>1.5>1.4>1.3>1.27>1.45  CMRI -  1. Normal LV size with EF 20%, diffuse hypokinesis with septal-lateral dyssynchrony c/w LBBB. 2.  Normal RV size with EF 26%. 3. Diffuse delayed enhancement imaging, however there does appear to be LGE in the septum that could be in a coronary disease-type pattern.  No dyspnea, walked in hall.  Intermittent nausea, resolved with Zofran.    Objective:   Weight Range: 71.8 kg Body mass index is 26.34 kg/m.   Vital Signs:   Temp:  [97.2 F (36.2 C)-97.6 F (36.4 C)] 97.6 F (36.4 C) (03/27 0300) Pulse Rate:  [56-72] 72 (03/27 0300) Resp:  [15-19] 15 (03/27 0300) BP: (115-130)/(57-68) 127/68 (03/27 0300) SpO2:  [93 %-97 %] 93 % (03/27 0300) Weight:  [71.8 kg] 71.8 kg (03/27 0300) Last BM Date: 02/17/21  Weight change: Filed Weights   02/16/21 0330 02/17/21 0403 02/18/21 0300  Weight: 71.4 kg 71.6 kg 71.8 kg    Intake/Output:   Intake/Output Summary (Last 24 hours) at 02/18/2021 0836 Last data filed at 02/17/2021 1720 Gross per 24 hour  Intake 490 ml  Output --  Net 490 ml      Physical Exam   CVP 6 General: NAD Neck: No JVD, no thyromegaly or thyroid nodule.  Lungs: Clear to auscultation bilaterally with normal respiratory effort. CV: Nondisplaced PMI.  Heart regular S1/S2, no S3/S4, no murmur.  No peripheral edema.   Abdomen: Soft, nontender, no hepatosplenomegaly, no distention.  Skin: Intact without lesions or rashes.  Neurologic: Alert and oriented x 3.  Psych: Normal affect. Extremities: No clubbing or cyanosis.  HEENT: Normal.   Telemetry   NSR 60s (personally reviewed)  EKG   n/a  Labs    CBC Recent Labs    02/17/21 0225 02/18/21 0352  WBC 11.2*  11.6*  NEUTROABS 7.8* 8.3*  HGB 12.8 12.6  HCT 37.9 36.1  MCV 87.9 86.2  PLT 358 397   Basic Metabolic Panel Recent Labs    02/17/21 0225 02/18/21 0352  NA 130* 128*  K 4.6 4.1  CL 89* 87*  CO2 30 31  GLUCOSE 138* 113*  BUN 18 20  CREATININE 1.27* 1.45*  CALCIUM 9.2 8.7*  MG 2.8* 2.4   Liver Function Tests No results for input(s): AST, ALT, ALKPHOS, BILITOT, PROT, ALBUMIN in the last 72 hours. No results for input(s): LIPASE, AMYLASE in the last 72 hours. Cardiac Enzymes No results for input(s): CKTOTAL, CKMB, CKMBINDEX, TROPONINI in the last 72 hours.  BNP: BNP (last 3 results) Recent Labs    04/13/20 0053 06/09/20 0358 02/11/21 2130  BNP 364.0* 260.0* 1,760.0*    ProBNP (last 3 results) No results for input(s): PROBNP in the last 8760 hours.   D-Dimer No results for input(s): DDIMER in the last 72 hours. Hemoglobin A1C No results for input(s): HGBA1C in the last 72 hours. Fasting Lipid Panel No results for input(s): CHOL, HDL, LDLCALC, TRIG, CHOLHDL, LDLDIRECT in the last 72 hours. Thyroid Function Tests No results for input(s): TSH, T4TOTAL, T3FREE, THYROIDAB in the last 72 hours.  Invalid input(s): FREET3  Other results:   Imaging    No results found.   Medications:  Scheduled Medications: . amiodarone  200 mg Oral BID  . atorvastatin  80 mg Oral Daily  . Chlorhexidine Gluconate Cloth  6 each Topical Daily  . dapagliflozin propanediol  10 mg Oral Daily  . heparin  5,000 Units Subcutaneous Q8H  . insulin aspart  0-5 Units Subcutaneous QHS  . insulin aspart  0-9 Units Subcutaneous TID WC  . isosorbide-hydrALAZINE  1 tablet Oral TID  . pantoprazole  40 mg Oral Daily  . sacubitril-valsartan  1 tablet Oral BID  . sodium chloride flush  10-40 mL Intracatheter Q12H  . sodium chloride flush  3 mL Intravenous Q12H  . spironolactone  25 mg Oral Daily  . [START ON 02/19/2021] torsemide  20 mg Oral Daily    Infusions:   PRN  Medications: acetaminophen **OR** acetaminophen, albuterol, bisacodyl, ondansetron **OR** ondansetron (ZOFRAN) IV, polyethylene glycol, sodium chloride flush, traZODone   Assessment/Plan   1. Acute on chronic systolic CHF:  Nonischemic cardiomyopathy based on 5/21 cath but echo at that time showed EF higher at 45%.  She was admitted 3/20 with volume overload and AFL versus AT with HR in 150s.  Echo this admission with EF 25-30%, moderate RV enlargement and moderately decreased RV systolic function with D-shaped septum, biatrial enlargement. Initial hypotension and AKI in setting of tachycardia.  CMRI with LV EF 20% RV EF 26%, technically difficult but there was delayed enhancement that could be a coronary disease-type pattern. Co-ox at little lower today at 54%, CVP 6 today. No dyspnea.  - Resend co-ox.  - Can hold torsemide today, restart tomorrow.   - Continue Farxiga 10 mg daily.   - Bidil increase to 1 tab tid.  - Continue spiro 25 mg daily.  - Continue Entresto 24/26 bid. - RHC/LHC Monday, discussed risks/benefits with patient and she agrees to procedure.  2. Rhythm: Atypical atrial flutter versus atrial tachycardia.  Also with post-termination pauses and episodes of junctional brady apparently at [facility]. Currently in NSR  on amiodarone gtt.  - EP consulted---> Atrial Tach. No plan for pacemaker at this time.  - Continue po amiodarone.  3. AKI: Baseline creatinine around 1.  Suspect AKI due to tachycardia/hypotension at [facility].  Creatinine as high as 2.2, now 1.45.  - Hold torsemide today.  4. LBBB: has at baseline, QRS around 130 msec 5. Hyponatremia: Fluid restrict.   Walk today.   RHC/LHC set up for Monday.    Length of Stay: 6  Loralie Champagne, MD  02/18/2021, 8:36 AM  Advanced Heart Failure Team Pager [phone] (M-F; 7a - 5p)  Please contact [facility] for night-coverage after hours (5p -7a ) and weekends on [email or web address]

## 2021-02-18 NOTE — Plan of Care (Signed)

## 2021-02-18 NOTE — H&P (View-Only) (Signed)
Patient ID: Michelle Morrison, female   DOB: [birth year], 60 y.o.   MRN: [id number]      Advanced Heart Failure Rounding Note  PCP-Cardiologist: Carlyle Dolly, MD   Subjective:    NSR today in 60s. SBP 120s.    CO-OX 54%, CVP 6 today.    Creatinine 1.7>1.5>1.4>1.3>1.27>1.45  CMRI -  1. Normal LV size with EF 20%, diffuse hypokinesis with septal-lateral dyssynchrony c/w LBBB. 2.  Normal RV size with EF 26%. 3. Diffuse delayed enhancement imaging, however there does appear to be LGE in the septum that could be in a coronary disease-type pattern.  No dyspnea, walked in hall.  Intermittent nausea, resolved with Zofran.    Objective:   Weight Range: 71.8 kg Body mass index is 26.34 kg/m.   Vital Signs:   Temp:  [97.2 F (36.2 C)-97.6 F (36.4 C)] 97.6 F (36.4 C) (03/27 0300) Pulse Rate:  [56-72] 72 (03/27 0300) Resp:  [15-19] 15 (03/27 0300) BP: (115-130)/(57-68) 127/68 (03/27 0300) SpO2:  [93 %-97 %] 93 % (03/27 0300) Weight:  [71.8 kg] 71.8 kg (03/27 0300) Last BM Date: 02/17/21  Weight change: Filed Weights   02/16/21 0330 02/17/21 0403 02/18/21 0300  Weight: 71.4 kg 71.6 kg 71.8 kg    Intake/Output:   Intake/Output Summary (Last 24 hours) at 02/18/2021 0836 Last data filed at 02/17/2021 1720 Gross per 24 hour  Intake 490 ml  Output --  Net 490 ml      Physical Exam   CVP 6 General: NAD Neck: No JVD, no thyromegaly or thyroid nodule.  Lungs: Clear to auscultation bilaterally with normal respiratory effort. CV: Nondisplaced PMI.  Heart regular S1/S2, no S3/S4, no murmur.  No peripheral edema.   Abdomen: Soft, nontender, no hepatosplenomegaly, no distention.  Skin: Intact without lesions or rashes.  Neurologic: Alert and oriented x 3.  Psych: Normal affect. Extremities: No clubbing or cyanosis.  HEENT: Normal.   Telemetry   NSR 60s (personally reviewed)  EKG   n/a  Labs    CBC Recent Labs    02/17/21 0225 02/18/21 0352  WBC 11.2*  11.6*  NEUTROABS 7.8* 8.3*  HGB 12.8 12.6  HCT 37.9 36.1  MCV 87.9 86.2  PLT 358 426   Basic Metabolic Panel Recent Labs    02/17/21 0225 02/18/21 0352  NA 130* 128*  K 4.6 4.1  CL 89* 87*  CO2 30 31  GLUCOSE 138* 113*  BUN 18 20  CREATININE 1.27* 1.45*  CALCIUM 9.2 8.7*  MG 2.8* 2.4   Liver Function Tests No results for input(s): AST, ALT, ALKPHOS, BILITOT, PROT, ALBUMIN in the last 72 hours. No results for input(s): LIPASE, AMYLASE in the last 72 hours. Cardiac Enzymes No results for input(s): CKTOTAL, CKMB, CKMBINDEX, TROPONINI in the last 72 hours.  BNP: BNP (last 3 results) Recent Labs    04/13/20 0053 06/09/20 0358 02/11/21 2130  BNP 364.0* 260.0* 1,760.0*    ProBNP (last 3 results) No results for input(s): PROBNP in the last 8760 hours.   D-Dimer No results for input(s): DDIMER in the last 72 hours. Hemoglobin A1C No results for input(s): HGBA1C in the last 72 hours. Fasting Lipid Panel No results for input(s): CHOL, HDL, LDLCALC, TRIG, CHOLHDL, LDLDIRECT in the last 72 hours. Thyroid Function Tests No results for input(s): TSH, T4TOTAL, T3FREE, THYROIDAB in the last 72 hours.  Invalid input(s): FREET3  Other results:   Imaging    No results found.   Medications:  Scheduled Medications: . amiodarone  200 mg Oral BID  . atorvastatin  80 mg Oral Daily  . Chlorhexidine Gluconate Cloth  6 each Topical Daily  . dapagliflozin propanediol  10 mg Oral Daily  . heparin  5,000 Units Subcutaneous Q8H  . insulin aspart  0-5 Units Subcutaneous QHS  . insulin aspart  0-9 Units Subcutaneous TID WC  . isosorbide-hydrALAZINE  1 tablet Oral TID  . pantoprazole  40 mg Oral Daily  . sacubitril-valsartan  1 tablet Oral BID  . sodium chloride flush  10-40 mL Intracatheter Q12H  . sodium chloride flush  3 mL Intravenous Q12H  . spironolactone  25 mg Oral Daily  . [START ON 02/19/2021] torsemide  20 mg Oral Daily    Infusions:   PRN  Medications: acetaminophen **OR** acetaminophen, albuterol, bisacodyl, ondansetron **OR** ondansetron (ZOFRAN) IV, polyethylene glycol, sodium chloride flush, traZODone   Assessment/Plan   1. Acute on chronic systolic CHF:  Nonischemic cardiomyopathy based on 5/21 cath but echo at that time showed EF higher at 45%.  She was admitted 3/20 with volume overload and AFL versus AT with HR in 150s.  Echo this admission with EF 25-30%, moderate RV enlargement and moderately decreased RV systolic function with D-shaped septum, biatrial enlargement. Initial hypotension and AKI in setting of tachycardia.  CMRI with LV EF 20% RV EF 26%, technically difficult but there was delayed enhancement that could be a coronary disease-type pattern. Co-ox at little lower today at 54%, CVP 6 today. No dyspnea.  - Resend co-ox.  - Can hold torsemide today, restart tomorrow.   - Continue Farxiga 10 mg daily.   - Bidil increase to 1 tab tid.  - Continue spiro 25 mg daily.  - Continue Entresto 24/26 bid. - RHC/LHC Monday, discussed risks/benefits with patient and she agrees to procedure.  2. Rhythm: Atypical atrial flutter versus atrial tachycardia.  Also with post-termination pauses and episodes of junctional brady apparently at [facility]. Currently in NSR  on amiodarone gtt.  - EP consulted---> Atrial Tach. No plan for pacemaker at this time.  - Continue po amiodarone.  3. AKI: Baseline creatinine around 1.  Suspect AKI due to tachycardia/hypotension at [facility].  Creatinine as high as 2.2, now 1.45.  - Hold torsemide today.  4. LBBB: has at baseline, QRS around 130 msec 5. Hyponatremia: Fluid restrict.   Walk today.   RHC/LHC set up for Monday.    Length of Stay: 6  Loralie Champagne, MD  02/18/2021, 8:36 AM  Advanced Heart Failure Team Pager [phone] (M-F; 7a - 5p)  Please contact [facility] for night-coverage after hours (5p -7a ) and weekends on [email or web address]

## 2021-02-19 ENCOUNTER — Encounter (HOSPITAL_COMMUNITY)
Admission: EM | Disposition: A | Discharge status: Home / Self Care | Payer: Self-pay | Source: Home / Self Care | Attending: Cardiology

## 2021-02-19 ENCOUNTER — Inpatient Hospital Stay (HOSPITAL_COMMUNITY): Payer: Medicare Other

## 2021-02-19 ENCOUNTER — Encounter (HOSPITAL_COMMUNITY): Payer: Self-pay | Admitting: Cardiology

## 2021-02-19 DIAGNOSIS — I5023 Acute on chronic systolic (congestive) heart failure: Secondary | ICD-10-CM | POA: Diagnosis not present

## 2021-02-19 DIAGNOSIS — I509 Heart failure, unspecified: Secondary | ICD-10-CM

## 2021-02-19 HISTORY — PX: RIGHT/LEFT HEART CATH AND CORONARY ANGIOGRAPHY: CATH118266

## 2021-02-19 LAB — COOXEMETRY PANEL
Carboxyhemoglobin: 1.2 % (ref 0.5–1.5)
Methemoglobin: 0.9 % (ref 0.0–1.5)
O2 Saturation: 60.4 %
Total hemoglobin: 12.4 g/dL (ref 12.0–16.0)

## 2021-02-19 LAB — GLUCOSE, CAPILLARY
Glucose-Capillary: 107 mg/dL — ABNORMAL HIGH (ref 70–99)
Glucose-Capillary: 119 mg/dL — ABNORMAL HIGH (ref 70–99)
Glucose-Capillary: 113 mg/dL — ABNORMAL HIGH (ref 70–99)
Glucose-Capillary: 112 mg/dL — ABNORMAL HIGH (ref 70–99)

## 2021-02-19 LAB — POCT I-STAT EG7
Acid-Base Excess: 3 mmol/L — ABNORMAL HIGH (ref 0.0–2.0)
Bicarbonate: 28.2 mmol/L — ABNORMAL HIGH (ref 20.0–28.0)
Calcium, Ion: 1.05 mmol/L — ABNORMAL LOW (ref 1.15–1.40)
HCT: 37 % (ref 36.0–46.0)
Hemoglobin: 12.6 g/dL (ref 12.0–15.0)
O2 Saturation: 79 %
Potassium: 3.9 mmol/L (ref 3.5–5.1)
Sodium: 131 mmol/L — ABNORMAL LOW (ref 135–145)
TCO2: 30 mmol/L (ref 22–32)
pCO2, Ven: 43.3 mmHg — ABNORMAL LOW (ref 44.0–60.0)
pH, Ven: 7.423 (ref 7.250–7.430)
pO2, Ven: 43 mmHg (ref 32.0–45.0)

## 2021-02-19 LAB — BASIC METABOLIC PANEL
Anion gap: 9 (ref 5–15)
BUN: 24 mg/dL — ABNORMAL HIGH (ref 6–20)
CO2: 28 mmol/L (ref 22–32)
Calcium: 8.7 mg/dL — ABNORMAL LOW (ref 8.9–10.3)
Chloride: 88 mmol/L — ABNORMAL LOW (ref 98–111)
Creatinine, Ser: 1.67 mg/dL — ABNORMAL HIGH (ref 0.44–1.00)
GFR, Estimated: 35 mL/min — ABNORMAL LOW (ref >60)
Glucose, Bld: 116 mg/dL — ABNORMAL HIGH (ref 70–99)
Potassium: 4.3 mmol/L (ref 3.5–5.1)
Sodium: 125 mmol/L — ABNORMAL LOW (ref 135–145)

## 2021-02-19 SURGERY — RIGHT/LEFT HEART CATH AND CORONARY ANGIOGRAPHY
Anesthesia: LOCAL

## 2021-02-19 MED ORDER — FENTANYL CITRATE (PF) 100 MCG/2ML IJ SOLN
INTRAMUSCULAR | Status: AC
  Filled 2021-02-19: qty 2

## 2021-02-19 MED ORDER — SODIUM CHLORIDE 0.9% FLUSH: 3.0000 mL | INTRAVENOUS | Status: DC | PRN

## 2021-02-19 MED ORDER — MIDAZOLAM HCL 2 MG/2ML IJ SOLN
INTRAMUSCULAR | Status: AC
  Filled 2021-02-19: qty 2

## 2021-02-19 MED ORDER — LIDOCAINE HCL (PF) 1 % IJ SOLN
INTRAMUSCULAR | Status: DC | PRN
  Administered 2021-02-19 (×2): 2 mL

## 2021-02-19 MED ORDER — SODIUM CHLORIDE 0.9 % IV SOLN
250.0000 mL | INTRAVENOUS | Status: DC | PRN
Start: 2021-02-19 — End: 2021-02-20

## 2021-02-19 MED ORDER — HYDRALAZINE HCL 20 MG/ML IJ SOLN: 10.0000 mg | INTRAMUSCULAR | Status: AC | PRN

## 2021-02-19 MED ORDER — FENTANYL CITRATE (PF) 100 MCG/2ML IJ SOLN
INTRAMUSCULAR | Status: DC | PRN
  Administered 2021-02-19: 25 ug via INTRAVENOUS

## 2021-02-19 MED ORDER — ALTEPLASE 2 MG IJ SOLR
2.0000 mg | Freq: Once | INTRAMUSCULAR | Status: AC
  Administered 2021-02-19: 2 mg
  Filled 2021-02-19: qty 2

## 2021-02-19 MED ORDER — TORSEMIDE 20 MG PO TABS: 20.0000 mg | ORAL_TABLET | Freq: Every day | ORAL | Status: DC

## 2021-02-19 MED ORDER — VERAPAMIL HCL 2.5 MG/ML IV SOLN
INTRAVENOUS | Status: DC | PRN
  Administered 2021-02-19: 10 mL via INTRA_ARTERIAL

## 2021-02-19 MED ORDER — HEPARIN SODIUM (PORCINE) 1000 UNIT/ML IJ SOLN
INTRAMUSCULAR | Status: DC | PRN
  Administered 2021-02-19: 3500 [IU] via INTRAVENOUS

## 2021-02-19 MED ORDER — MIDAZOLAM HCL 2 MG/2ML IJ SOLN
INTRAMUSCULAR | Status: DC | PRN
  Administered 2021-02-19: 1 mg via INTRAVENOUS

## 2021-02-19 MED ORDER — ACETAMINOPHEN 325 MG PO TABS: 650.0000 mg | ORAL_TABLET | ORAL | Status: DC | PRN

## 2021-02-19 MED ORDER — ONDANSETRON HCL 4 MG/2ML IJ SOLN: 4.0000 mg | Freq: Four times a day (QID) | INTRAMUSCULAR | Status: DC | PRN

## 2021-02-19 MED ORDER — HEPARIN SODIUM (PORCINE) 5000 UNIT/ML IJ SOLN
5000.0000 [IU] | Freq: Three times a day (TID) | INTRAMUSCULAR | Status: DC
  Administered 2021-02-20: 5000 [IU] via SUBCUTANEOUS
  Filled 2021-02-19: qty 1

## 2021-02-19 MED ORDER — IOHEXOL 350 MG/ML SOLN
INTRAVENOUS | Status: DC | PRN
  Administered 2021-02-19: 80 mL

## 2021-02-19 MED ORDER — SODIUM CHLORIDE 0.9% FLUSH
3.0000 mL | Freq: Two times a day (BID) | INTRAVENOUS | Status: DC
  Administered 2021-02-19 (×2): 3 mL via INTRAVENOUS

## 2021-02-19 MED ORDER — HEPARIN SODIUM (PORCINE) 1000 UNIT/ML IJ SOLN
INTRAMUSCULAR | Status: AC
  Filled 2021-02-19: qty 1

## 2021-02-19 MED ORDER — SODIUM CHLORIDE 0.9 % IV SOLN: INTRAVENOUS | Status: AC

## 2021-02-19 MED ORDER — HEPARIN (PORCINE) IN NACL 1000-0.9 UT/500ML-% IV SOLN
INTRAVENOUS | Status: DC | PRN
  Administered 2021-02-19 (×2): 500 mL

## 2021-02-19 MED ORDER — VERAPAMIL HCL 2.5 MG/ML IV SOLN
INTRAVENOUS | Status: AC
  Filled 2021-02-19: qty 2

## 2021-02-19 MED ORDER — LIDOCAINE HCL (PF) 1 % IJ SOLN
INTRAMUSCULAR | Status: AC
  Filled 2021-02-19: qty 30

## 2021-02-19 MED ORDER — LABETALOL HCL 5 MG/ML IV SOLN: 10.0000 mg | INTRAVENOUS | Status: AC | PRN

## 2021-02-19 MED ORDER — HEPARIN (PORCINE) IN NACL 1000-0.9 UT/500ML-% IV SOLN
INTRAVENOUS | Status: AC
  Filled 2021-02-19: qty 1000

## 2021-02-19 SURGICAL SUPPLY — 12 items
CATH 5FR JL3.5 JR4 ANG PIG MP (CATHETERS) ×1 IMPLANT
CATH INFINITI 5 FR 3DRC (CATHETERS) ×1 IMPLANT
CATH SWAN GANZ 7F STRAIGHT (CATHETERS) ×1 IMPLANT
DEVICE RAD COMP TR BAND LRG (VASCULAR PRODUCTS) ×1 IMPLANT
GLIDESHEATH SLEND SS 6F .021 (SHEATH) ×1 IMPLANT
GLIDESHEATH SLENDER 7FR .021G (SHEATH) ×1 IMPLANT
GUIDEWIRE INQWIRE 1.5J.035X260 (WIRE) IMPLANT
INQWIRE 1.5J .035X260CM (WIRE) ×2
KIT HEART LEFT (KITS) ×2 IMPLANT
PACK CARDIAC CATHETERIZATION (CUSTOM PROCEDURE TRAY) ×2 IMPLANT
TRANSDUCER W/STOPCOCK (MISCELLANEOUS) ×2 IMPLANT
WIRE HI TORQ VERSACORE-J 145CM (WIRE) ×1 IMPLANT

## 2021-02-19 NOTE — Interval H&P Note (Signed)
History and Physical Interval Note:  02/19/2021 9:15 AM  Michelle Morrison  has presented today for surgery, with the diagnosis of heart failure.  The various methods of treatment have been discussed with the patient and family. After consideration of risks, benefits and other options for treatment, the patient has consented to  Procedure(s): RIGHT/LEFT HEART CATH AND CORONARY ANGIOGRAPHY (N/A) as a surgical intervention.  The patient's history has been reviewed, patient examined, no change in status, stable for surgery.  I have reviewed the patient's chart and labs.  Questions were answered to the patient's satisfaction.     Gerlene Glassburn [employer]

## 2021-02-19 NOTE — Progress Notes (Signed)
Patient via bed to cath lab 

## 2021-02-19 NOTE — Evaluation (Signed)
Physical Therapy Evaluation Patient Details Name: Michelle Morrison MRN: [id number] DOB: [birth year] Today's Date: 02/19/2021   History of Present Illness  Pt if a 60 y/o female presenting 3/2o with recurrent atrial tachyarrhythmias, c/o abdominal pain.  Work up found acute on chronic combined systolic and diastolic CHF.  Right/Left heart Cath completed 3/28.    PMHx: CHF, DM2, HTN,  Clinical Impression  Pt is at or close to baseline functioning and should be safe at home. There are no further acute PT needs.  Will sign off at this time.     Follow Up Recommendations No PT follow up    Equipment Recommendations  None recommended by PT    Recommendations for Other Services       Precautions / Restrictions Precautions Precautions: Fall      Mobility  Bed Mobility Overal bed mobility: Modified Independent                  Transfers Overall transfer level: Modified independent                  Ambulation/Gait Ambulation/Gait assistance: Supervision Gait Distance (Feet): 800 Feet Assistive device: None Gait Pattern/deviations: Step-through pattern   Gait velocity interpretation: 1.31 - 2.62 ft/sec, indicative of limited community ambulator General Gait Details: Paretic-like gait pattern, stable and smooths out with increased speed.  Stairs Stairs: Yes Stairs assistance: Supervision Stair Management: One rail Left;Alternating pattern;Step to pattern;Forwards Number of Stairs: 4 General stair comments: safe with the rail  Wheelchair Mobility    Modified Rankin (Stroke Patients Only)       Balance Overall balance assessment: Needs assistance Sitting-balance support: No upper extremity supported;Feet supported Sitting balance-Leahy Scale: Good     Standing balance support: During functional activity Standing balance-Leahy Scale: Good                               Pertinent Vitals/Pain Pain Assessment: No/denies pain    Home Living  Family/patient expects to be discharged to:: Private residence Living Arrangements: Children Available Help at Discharge: Family;Available PRN/intermittently Type of Home: Apartment Home Access: Stairs to enter     Home Layout: One level Home Equipment: None Additional Comments: has not needed a walker previously    Prior Function Level of Independence: Independent         Comments: home with daughter who works     Engineer, [employer]   Dominant Hand: Right    Extremity/Trunk Assessment   Upper Extremity Assessment Upper Extremity Assessment: Overall WFL for tasks assessed (R hand has post Cath limitations on evaluation)    Lower Extremity Assessment Lower Extremity Assessment: Overall WFL for tasks assessed       Communication   Communication: Expressive difficulties  Cognition Arousal/Alertness: Awake/alert Behavior During Therapy: WFL for tasks assessed/performed Overall Cognitive Status: Within Functional Limits for tasks assessed                                        General Comments General comments (skin integrity, edema, etc.): vss overall    Exercises     Assessment/Plan    PT Assessment Patent does not need any further PT services  PT Problem List         PT Treatment Interventions      PT Goals (Current goals can be found in the Care  Plan section)  Acute Rehab PT Goals Patient Stated Goal: home tomorrow PT Goal Formulation: All assessment and education complete, DC therapy    Frequency     Barriers to discharge        Co-evaluation               AM-PAC PT "6 Clicks" Mobility  Outcome Measure Help needed turning from your back to your side while in a flat bed without using bedrails?: None Help needed moving from lying on your back to sitting on the side of a flat bed without using bedrails?: None Help needed moving to and from a bed to a chair (including a wheelchair)?: None Help needed standing up from a chair  using your arms (e.g., wheelchair or bedside chair)?: None Help needed to walk in hospital room?: A Little Help needed climbing 3-5 steps with a railing? : A Little 6 Click Score: 22    End of Session   Activity Tolerance: Patient tolerated treatment well Patient left: in chair;with call bell/phone within reach Nurse Communication: Mobility status PT Visit Diagnosis: Other abnormalities of gait and mobility (R26.89);Difficulty in walking, not elsewhere classified (R26.2)    Time: 1884-1660 PT Time Calculation (min) (ACUTE ONLY): 16 min   Charges:   PT Evaluation $PT Eval Moderate Complexity: 1 Mod          02/19/2021  Ginger Carne., PT Acute Rehabilitation Services [phone]  (pager) [phone]  (office)  Tessie Fass General Wearing 02/19/2021, 3:35 PM

## 2021-02-19 NOTE — TOC Initial Note (Addendum)
Transition of Care (TOC) - Initial/Assessment Note  Heart Failure   Patient Details  Name: Michelle Morrison MRN: [id number] Date of Birth: Sep 05, 1961  [facility]) CM/SW Contact:    [place], [place] Phone Number: 02/19/2021, 11:03 AM  Clinical Narrative:       CSW completed SDOH with the patient who reported having financial needs including help with her bills. CSW will bring resources for support including rent and utility assistance. CSW can arrange discharge pending disposition. recommendations. Patient reports that she has a primary care doctor Michelle Morrison at [facility] and she is able to get to appointments and pick up her medications. CSW provided the patient with the social workers name, number and position and if anything changes to please reach out so that CSW can provide support.  TOC will continue to follow for d/c needs.         Patient Goals and CMS Choice        Expected Discharge Plan and Services                                                Prior Living Arrangements/Services                       Activities of Daily Living      Permission Sought/Granted                  Emotional Assessment              Admission diagnosis:  Nausea [R11.0] Atrial flutter with rapid ventricular response (HCC) [I48.92] Atrial fibrillation with RVR (HCC) [I48.91] Atrial tachycardia, paroxysmal (HCC) [I47.1] Acute on chronic combined systolic and diastolic CHF (congestive heart failure) ([place]) [I50.43] Patient Active Problem List   Diagnosis Date Noted  . Atrial tachycardia, paroxysmal ([place]) 02/12/2021  . Paroxysmal A-fib/Flutter(HCC) 02/11/2021  . Diabetes mellitus type 2 with complications ([place]) 75/44/9201  . Atrial fibrillation with RVR ([place]) 02/11/2021  . Acute on chronic combined systolic and diastolic CHF (congestive heart failure) ([place]) 06/09/2020  . Hypertensive urgency   . Elevated troponin   .  Dyspnea due to congestive heart failure ([place]) 04/13/2020  . Accelerated hypertension 04/13/2020  . Hyperglycemia due to diabetes mellitus ([place]) 04/13/2020  . 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 Herpes simplex infection 11/21/2015  . HTN (hypertension), benign 04/01/2013   PCP:  Michelle Fire, MD Pharmacy:   [facility], Michelle Morrison. Michelle Morrison [id number] Phone: [phone] Fax: [phone]     Social Determinants of Health (SDOH) Interventions Food Insecurity Interventions: Intervention Not Indicated,Other (Comment) (Pt. reports having food stamps) Financial Strain Interventions: Intervention Not Indicated,Other (Comment) (CSW will provide resources) Housing Interventions: Other (Comment) (CSW will provide resources for rent and utilities. Pt. reports the stairs to be a problem for her would like an apartment on the first floor and is open to moving if needed. Pt. lives with her daughter.) Transportation Interventions: Intervention Not Indicated,Other (Comment) (Pt. reports she is able to get to doctors appointments and getting her medicines.)  Readmission Risk Interventions No flowsheet data found.  Michelle Morrison, MSW, [place] Heart Failure Social Worker

## 2021-02-19 NOTE — Progress Notes (Signed)
TR band removed, no bleeding noted.  Small gauze with tagaderm dressing applied.  Patient reminded not to use that arm

## 2021-02-19 NOTE — Discharge Summary (Incomplete)
Advanced Heart Failure Team  Discharge Summary   Patient ID: Michelle Morrison MRN: [id number], DOB/AGE: 06-16-1961 60 y.o. Admit date: 02/11/2021 D/C date:     02/20/2021   Primary Discharge Diagnoses:  1. Acute on chronic systolic CHF: Nonischemic cardiomyopathy  2. Atrial Tachycardia   3. AKI:  4. LBBB: has at baseline, QRS around 130 msec 5. Hyponatremia  Hospital Course:   Michelle Morrison has a history of HTN, SVT, and mild nonischemic cardiomopathy.  In 5/21, echo showed EF 45% with severe anteroseptal hypokinesis.  LHC in 5/21 showed normal coronaries.  She has LBBB at baseline.  She has been noted to have SVT in the past.  On 02/11/21, she went to the ER at [facility] with abdominal pain and distention.  She was noted to be in atypical flutter versus atrial tach with rate around 150.  She was hypotensive and got IVF, was also transiently on norepinephrine.  HR dropped transiently to the 40s with junctional bradycardia. AKI with creatinine up to 2.21.  Echo was done and showed EF 25-30%, moderate RV enlargement and moderately decreased RV systolic function with D-shaped septum, biatrial enlargement.  Concern for tachy-brady syndrome, she was transferred to [facility] for further evaluation.    She converted to NSR w/ amiodarone. EP was consulted and determined rhythm to be atrial tach and not aflutter. No indication for pacemaker. Ablation would likely be difficult. She maintained NSR through the rest of admission w/o recurrence.   She was diuresed w/ IV Lasix. CMRI showed LV EF 20% RV EF 26%, technically difficult but there was delayed enhancement suggestive of possible coronary disease-type pattern, however LHC showed no CAD. RHC after diureses showed low filling pressures and preserved CO. She was placed on GDMT and transitioned to PO diuretics. On 3/29, she was last seen and examined by Dr. [name] and felt stable for discharge home. Post hospital f/u in the [facility] has been arranged. Discharge wt 161 lb.     Consultants  EP   Discharge Weight Range: 161 lb   Discharge Vitals: Blood pressure 130/60, pulse (!) 55, temperature 97.6 F (36.4 C), temperature source Oral, resp. rate 19, height 5\' 5"  (1.651 m), weight 73.3 kg, last menstrual period 05/23/2007, SpO2 98 %.  Labs: Lab Results  Component Value Date   WBC 8.9 02/20/2021   HGB 10.8 (L) 02/20/2021   HCT 32.0 (L) 02/20/2021   MCV 87.7 02/20/2021   PLT 351 02/20/2021    Recent Labs  Lab 02/20/21 0500  NA 129*  K 4.6  CL 95*  CO2 28  BUN 24*  CREATININE 1.70*  CALCIUM 8.2*  GLUCOSE 107*   Lab Results  Component Value Date   CHOL 172 04/14/2020   HDL 62 04/14/2020   LDLCALC 100 (H) 04/14/2020   TRIG 52 04/14/2020   BNP (last 3 results) Recent Labs    04/13/20 0053 06/09/20 0358 02/11/21 2130  BNP 364.0* 260.0* 1,760.0*    ProBNP (last 3 results) No results for input(s): PROBNP in the last 8760 hours.   Diagnostic Studies/Procedures   CARDIAC CATHETERIZATION  Result Date: 02/19/2021 1. Low filling pressures. 2. Preserved cardiac output. 3. No significant CAD. Nonischemic cardiomyopathy.  Will give back IV fluid.   DG CHEST PORT 1 VIEW  Result Date: 02/19/2021 CLINICAL DATA:  PICC placement EXAM: PORTABLE CHEST 1 VIEW COMPARISON:  Eight days ago FINDINGS: Normal heart size and mediastinal contours. There is no edema, consolidation, effusion, or pneumothorax. Right PICC with tip at  the upper SVC. IMPRESSION: No active disease. Electronically Signed   By: Monte Fantasia M.D.   On: 02/19/2021 05:51    CMRI 02/15/21  1. Normal LV size with EF 20%, diffuse hypokinesis with septal-lateral dyssynchrony c/w LBBB. 2. Normal RV size with EF 26%. 3. Diffuse delayed enhancement imaging, however there does appear to be LGE in the septum that could be in a coronary disease-type pattern.  Discharge Medications   Allergies as of 02/20/2021   No Known Allergies     Medication List    STOP taking these  medications   aspirin 81 MG chewable tablet   carvedilol 6.25 MG tablet Commonly known as: COREG   ibuprofen 600 MG tablet Commonly known as: ADVIL   losartan 100 MG tablet Commonly known as: COZAAR   phenazopyridine 200 MG tablet Commonly known as: Pyridium   sulfamethoxazole-trimethoprim 800-160 MG tablet Commonly known as: BACTRIM DS   Tradjenta 5 MG Tabs tablet Generic drug: linagliptin     TAKE these medications   albuterol 108 (90 Base) MCG/ACT inhaler Commonly known as: VENTOLIN HFA Inhale 1-2 puffs into the lungs every 4 (four) hours as needed.   amiodarone 200 MG tablet Commonly known as: PACERONE Take 1 tablet 2 times a day until 02/27/21, then reduce down to 1 tablet once daily   atorvastatin 80 MG tablet Commonly known as: LIPITOR Take 80 mg by mouth daily.   dapagliflozin propanediol 10 MG Tabs tablet Commonly known as: FARXIGA Take 1 tablet (10 mg total) by mouth daily.   furosemide 20 MG tablet Commonly known as: LASIX Take 1 tablet (20 mg total) by mouth daily. Start taking on: February 21, 2021 What changed:   medication strength  how much to take   isosorbide-hydrALAZINE 20-37.5 MG tablet Commonly known as: BIDIL Take 1 tablet by mouth 3 (three) times daily.   sacubitril-valsartan 24-26 MG Commonly known as: ENTRESTO Take 1 tablet by mouth 2 (two) times daily.   spironolactone 25 MG tablet Commonly known as: ALDACTONE Take 1 tablet (25 mg total) by mouth daily.       Disposition   The patient will be discharged in stable condition to home. Discharge Instructions    Amb Referral to Cardiac Rehabilitation   Complete by: As directed    Diagnosis: Heart Failure (see criteria below if ordering Phase II)   Heart Failure Type: Chronic Systolic & Diastolic   After initial evaluation and assessments completed: Virtual Based Care may be provided alone or in conjunction with Phase 2 Cardiac Rehab based on patient barriers.: Yes       Follow-up Information    [facility] Follow up on 03/06/2021.   Specialty: Cardiology Why: at 8:40. Garage Code [id number] Contact information: [address] [id number] [place] [phone]                Duration of Discharge Encounter: Greater than 35 minutes   Signed, Nelida Gores  02/20/2021, 9:47 AM

## 2021-02-19 NOTE — Progress Notes (Addendum)
Patient ID: Michelle Morrison, female   DOB: 04-Jan-1961, 60 y.o.   MRN: [id number]      Advanced Heart Failure Rounding Note  PCP-Cardiologist: Carlyle Dolly, MD   Subjective:    NSR today in 70s. SBP 120s.     CO-OX 60%. CVP not working (clotted, TPA ordered)  Creatinine trending back up 1.7>1.5>1.4>1.3>1.27>1.45>1.67   Doubt today's wt accurate. Charted as 7 lb gain. Not volume overloaded on exam.   Feels well. No complaints. Denies dyspnea.   CMRI -  1. Normal LV size with EF 20%, diffuse hypokinesis with septal-lateral dyssynchrony c/w LBBB. 2.  Normal RV size with EF 26%. 3. Diffuse delayed enhancement imaging, however there does appear to be LGE in the septum that could be in a coronary disease-type pattern.     Objective:   Weight Range: 75 kg Body mass index is 27.51 kg/m.   Vital Signs:   Temp:  [97.1 F (36.2 C)-98.5 F (36.9 C)] 98.4 F (36.9 C) (03/28 0745) Pulse Rate:  [56-79] 64 (03/28 0745) Resp:  [10-19] 12 (03/28 0745) BP: (107-148)/(59-86) 126/59 (03/28 0745) SpO2:  [92 %-98 %] 93 % (03/27 2300) Weight:  [75 kg] 75 kg (03/28 0303) Last BM Date: 02/17/21  Weight change: Filed Weights   02/17/21 0403 02/18/21 0300 02/19/21 0303  Weight: 71.6 kg 71.8 kg 75 kg    Intake/Output:   Intake/Output Summary (Last 24 hours) at 02/19/2021 0805 Last data filed at 02/18/2021 1720 Gross per 24 hour  Intake 723 ml  Output 550 ml  Net 173 ml      Physical Exam    General:  Well appearing. No respiratory difficulty HEENT: normal Neck: supple. no JVD. Carotids 2+ bilat; no bruits. No lymphadenopathy or thyromegaly appreciated. Cor: PMI nondisplaced. Regular rate & rhythm. No rubs, gallops or murmurs. Lungs: clear Abdomen: soft, nontender, nondistended. No hepatosplenomegaly. No bruits or masses. Good bowel sounds. Extremities: no cyanosis, clubbing, rash, edema Neuro: alert & oriented x 3, cranial nerves grossly intact. moves all 4 extremities  w/o difficulty. Affect pleasant.   Telemetry   NSR 70s (personally reviewed)  EKG    n/a  Labs    CBC Recent Labs    02/17/21 0225 02/18/21 0352  WBC 11.2* 11.6*  NEUTROABS 7.8* 8.3*  HGB 12.8 12.6  HCT 37.9 36.1  MCV 87.9 86.2  PLT 358 076   Basic Metabolic Panel Recent Labs    02/17/21 0225 02/18/21 0352 02/19/21 0414  NA 130* 128* 125*  K 4.6 4.1 4.3  CL 89* 87* 88*  CO2 30 31 28   GLUCOSE 138* 113* 116*  BUN 18 20 24*  CREATININE 1.27* 1.45* 1.67*  CALCIUM 9.2 8.7* 8.7*  MG 2.8* 2.4  --    Liver Function Tests No results for input(s): AST, ALT, ALKPHOS, BILITOT, PROT, ALBUMIN in the last 72 hours. No results for input(s): LIPASE, AMYLASE in the last 72 hours. Cardiac Enzymes No results for input(s): CKTOTAL, CKMB, CKMBINDEX, TROPONINI in the last 72 hours.  BNP: BNP (last 3 results) Recent Labs    04/13/20 0053 06/09/20 0358 02/11/21 2130  BNP 364.0* 260.0* 1,760.0*    ProBNP (last 3 results) No results for input(s): PROBNP in the last 8760 hours.   D-Dimer No results for input(s): DDIMER in the last 72 hours. Hemoglobin A1C No results for input(s): HGBA1C in the last 72 hours. Fasting Lipid Panel No results for input(s): CHOL, HDL, LDLCALC, TRIG, CHOLHDL, LDLDIRECT in the last 72 hours. Thyroid  Function Tests No results for input(s): TSH, T4TOTAL, T3FREE, THYROIDAB in the last 72 hours.  Invalid input(s): FREET3  Other results:   Imaging    DG CHEST PORT 1 VIEW  Result Date: 02/19/2021 CLINICAL DATA:  PICC placement EXAM: PORTABLE CHEST 1 VIEW COMPARISON:  Eight days ago FINDINGS: Normal heart size and mediastinal contours. There is no edema, consolidation, effusion, or pneumothorax. Right PICC with tip at the upper SVC. IMPRESSION: No active disease. Electronically Signed   By: Monte Fantasia M.D.   On: 02/19/2021 05:51     Medications:     Scheduled Medications: . alteplase  2 mg Intracatheter Once  . alteplase  2 mg  Intracatheter Once  . amiodarone  200 mg Oral BID  . atorvastatin  80 mg Oral Daily  . Chlorhexidine Gluconate Cloth  6 each Topical Daily  . dapagliflozin propanediol  10 mg Oral Daily  . heparin  5,000 Units Subcutaneous Q8H  . insulin aspart  0-5 Units Subcutaneous QHS  . insulin aspart  0-9 Units Subcutaneous TID WC  . isosorbide-hydrALAZINE  1 tablet Oral TID  . pantoprazole  40 mg Oral Daily  . sacubitril-valsartan  1 tablet Oral BID  . sodium chloride flush  10-40 mL Intracatheter Q12H  . sodium chloride flush  3 mL Intravenous Q12H  . spironolactone  25 mg Oral Daily  . torsemide  20 mg Oral Daily    Infusions: . sodium chloride    . sodium chloride 75 mL/hr at 02/19/21 0750    PRN Medications: sodium chloride, acetaminophen **OR** acetaminophen, albuterol, bisacodyl, ondansetron **OR** ondansetron (ZOFRAN) IV, polyethylene glycol, sodium chloride flush, sodium chloride flush, traZODone   Assessment/Plan   1. Acute on chronic systolic CHF:  Nonischemic cardiomyopathy based on 5/21 cath but echo at that time showed EF higher at 45%.  She was admitted 3/20 with volume overload and AFL versus AT with HR in 150s.  Echo this admission with EF 25-30%, moderate RV enlargement and moderately decreased RV systolic function with D-shaped septum, biatrial enlargement. Initial hypotension and AKI in setting of tachycardia.  CMRI with LV EF 20% RV EF 26%, technically difficult but there was delayed enhancement that could be a coronary disease-type pattern.  - Co-ox 60%. CVP not working but appears euvolemic on exam. SCr/BUN up  - Hold torsmide for now, until RHC.    - Continue Farxiga 10 mg daily.   - Bidil  1 tab tid.  - Continue spiro 25 mg daily.  - Continue Entresto 24/26 bid. - RHC/LHC today, discussed risks/benefits with patient and she agrees to procedure.  2. Rhythm: Atypical atrial flutter versus atrial tachycardia.  Also with post-termination pauses and episodes of junctional  brady apparently at [facility]. Currently in NSR  - EP consulted---> Atrial Tach. No plan for pacemaker at this time.  - Continue po amiodarone.  3. AKI: Baseline creatinine around 1.  Suspect AKI due to tachycardia/hypotension at [facility].  Creatinine as high as 2.2, now 1.67 - Hold torsemide today. RHC to guide further diuresis.   4. LBBB: has at baseline, QRS around 130 msec 5. Hyponatremia: Na 125  - may need tolvaptan  - RHC today to assess volume status   Length of Stay: [address] PA-C  02/19/2021, 8:05 AM  Advanced Heart Failure Team Pager [phone] (M-F; 7a - 5p)  Please contact [facility] for night-coverage after hours (5p -7a ) and weekends on [email or web address]  Patient seen with PA, agree with the  above note.   She remains in NSR. No dyspnea.   LHC/RHC done today:  Coronary Findings   Diagnostic Dominance: Right  Left Main  Vessel is normal in caliber. Vessel is angiographically normal. The vessel is mildly calcified.  Left Anterior Descending  Vessel is normal in caliber. Vessel is angiographically normal. The vessel is severely tortuous.  Left Circumflex  Vessel is normal in caliber. Vessel is angiographically normal. The vessel is severely tortuous.  Right Coronary Artery  Vessel is normal in caliber. Vessel is angiographically normal. The vessel is severely tortuous. Anterior take off   Intervention   No interventions have been documented.  Right Heart  Right Heart Pressures RHC Procedural Findings: Hemodynamics (mmHg) RA mean 1 RV 33/2 PA 35/7, mean 18 PCWP mean 4 LV 155/7 AO 150/56  Oxygen saturations: PA 78% AO 100%  Cardiac Output (Fick) 6.44  Cardiac Index (Fick) 3.53  Cardiac Output (Thermo) 5.04  Cardiac Index (Thermo) 2.76   General: NAD Neck: No JVD, no thyromegaly or thyroid nodule.  Lungs: Clear to auscultation bilaterally with normal respiratory effort. CV: Nondisplaced PMI.  Heart regular S1/S2, no S3/S4, no murmur.  No  peripheral edema.   Abdomen: Soft, nontender, no hepatosplenomegaly, no distention.  Skin: Intact without lesions or rashes.  Neurologic: Alert and oriented x 3.  Psych: Normal affect. Extremities: No clubbing or cyanosis.  HEENT: Normal.   Nonischemic cardiomyopathy, suspect worsening EF may be tachy-mediated from prior incessant AT.   Filling pressures low on RHC with creatinine mildly higher at 1.6.  Na lower at 125.  - Will give 75 cc/hr NS x 12 hrs.   - Stop torsemide for now.  - Hold spironolactone today, restart tomorrow.  - 1500 cc fluid restriction.   Possible home tomorrow.   Loralie Champagne 02/19/2021 10:41 AM

## 2021-02-19 NOTE — Plan of Care (Signed)

## 2021-02-20 ENCOUNTER — Other Ambulatory Visit: Payer: Self-pay | Admitting: Cardiology

## 2021-02-20 DIAGNOSIS — I5023 Acute on chronic systolic (congestive) heart failure: Secondary | ICD-10-CM | POA: Diagnosis not present

## 2021-02-20 LAB — CBC
HCT: 32 % — ABNORMAL LOW (ref 36.0–46.0)
Hemoglobin: 10.8 g/dL — ABNORMAL LOW (ref 12.0–15.0)
MCH: 29.6 pg (ref 26.0–34.0)
MCHC: 33.8 g/dL (ref 30.0–36.0)
MCV: 87.7 fL (ref 80.0–100.0)
Platelets: 351 10*3/uL (ref 150–400)
RBC: 3.65 MIL/uL — ABNORMAL LOW (ref 3.87–5.11)
RDW: 15.9 % — ABNORMAL HIGH (ref 11.5–15.5)
WBC: 8.9 10*3/uL (ref 4.0–10.5)
nRBC: 0 % (ref 0.0–0.2)

## 2021-02-20 LAB — BASIC METABOLIC PANEL
Anion gap: 6 (ref 5–15)
BUN: 24 mg/dL — ABNORMAL HIGH (ref 6–20)
CO2: 28 mmol/L (ref 22–32)
Calcium: 8.2 mg/dL — ABNORMAL LOW (ref 8.9–10.3)
Chloride: 95 mmol/L — ABNORMAL LOW (ref 98–111)
Creatinine, Ser: 1.7 mg/dL — ABNORMAL HIGH (ref 0.44–1.00)
GFR, Estimated: 34 mL/min — ABNORMAL LOW (ref >60)
Glucose, Bld: 107 mg/dL — ABNORMAL HIGH (ref 70–99)
Potassium: 4.6 mmol/L (ref 3.5–5.1)
Sodium: 129 mmol/L — ABNORMAL LOW (ref 135–145)

## 2021-02-20 LAB — COOXEMETRY PANEL
Carboxyhemoglobin: 1.2 % (ref 0.5–1.5)
Methemoglobin: 0.9 % (ref 0.0–1.5)
O2 Saturation: 68.1 %
Total hemoglobin: 11.2 g/dL — ABNORMAL LOW (ref 12.0–16.0)

## 2021-02-20 LAB — GLUCOSE, CAPILLARY
Glucose-Capillary: 117 mg/dL — ABNORMAL HIGH (ref 70–99)
Glucose-Capillary: 110 mg/dL — ABNORMAL HIGH (ref 70–99)

## 2021-02-20 MED ORDER — SACUBITRIL-VALSARTAN 24-26 MG PO TABS: 1.0000 | ORAL_TABLET | Freq: Two times a day (BID) | ORAL | 5 refills | Status: DC

## 2021-02-20 MED ORDER — FUROSEMIDE 20 MG PO TABS: 20.0000 mg | ORAL_TABLET | Freq: Every day | ORAL | 5 refills | Status: DC

## 2021-02-20 MED ORDER — ISOSORB DINITRATE-HYDRALAZINE 20-37.5 MG PO TABS: 1.0000 | ORAL_TABLET | Freq: Three times a day (TID) | ORAL | 5 refills | Status: DC

## 2021-02-20 MED ORDER — SPIRONOLACTONE 25 MG PO TABS: 25.0000 mg | ORAL_TABLET | Freq: Every day | ORAL | 5 refills | Status: DC

## 2021-02-20 MED ORDER — DAPAGLIFLOZIN PROPANEDIOL 10 MG PO TABS: 10.0000 mg | ORAL_TABLET | Freq: Every day | ORAL | 5 refills | Status: DC

## 2021-02-20 MED ORDER — FUROSEMIDE 20 MG PO TABS: 20.0000 mg | ORAL_TABLET | Freq: Every day | ORAL | Status: DC

## 2021-02-20 MED ORDER — AMIODARONE HCL 200 MG PO TABS: ORAL_TABLET | ORAL | 2 refills | Status: DC

## 2021-02-20 MED ORDER — SPIRONOLACTONE 25 MG PO TABS
25.0000 mg | ORAL_TABLET | Freq: Every day | ORAL | Status: DC
  Administered 2021-02-20: 25 mg via ORAL
  Filled 2021-02-20: qty 1

## 2021-02-20 MED FILL — AMIODARONE HCL 200 MG TABS: 200 | 60 days supply | Qty: 90 | Fill #0

## 2021-02-20 MED FILL — FUROSEMIDE 20 MG TAB: 20 | 30 days supply | Qty: 30 | Fill #0

## 2021-02-20 MED FILL — FARXIGA 10 MG TABLET: 10 | 30 days supply | Qty: 30 | Fill #0

## 2021-02-20 MED FILL — SPIRONOLACTONE 25 MG TABLET: 25 | 30 days supply | Qty: 30 | Fill #0

## 2021-02-20 MED FILL — BIDIL 20-37.5 MG TABS: 20-37.5 | 30 days supply | Qty: 90 | Fill #0

## 2021-02-20 MED FILL — ENTRESTO 24 MG-26 MG TABLET: 24-26 | 30 days supply | Qty: 60 | Fill #0

## 2021-02-20 NOTE — Progress Notes (Addendum)
Patient ID: Michelle Morrison, female   DOB: 1961-05-25, 60 y.o.   MRN: [id number]      Advanced Heart Failure Rounding Note  PCP-Cardiologist: Carlyle Dolly, MD   Subjective:    [facility] yesterday showed  1. Low filling pressures.  2. Preserved cardiac output.  3. No significant CAD.   Diuretics held yesterday. IVFs given. Creatinine stable 1.67>>1.70 Na improving, 125>>129. CVP 5-6. Co-ox 68%   Feels well this morning. No complaints. NSR on tele.    CMRI -  1. Normal LV size with EF 20%, diffuse hypokinesis with septal-lateral dyssynchrony c/w LBBB. 2.  Normal RV size with EF 26%. 3. Diffuse delayed enhancement imaging, however there does appear to be LGE in the septum that could be in a coronary disease-type pattern.     Objective:   Weight Range: 73.3 kg Body mass index is 26.89 kg/m.   Vital Signs:   Temp:  [97.6 F (36.4 C)-98.7 F (37.1 C)] 98.7 F (37.1 C) (03/29 0300) Pulse Rate:  [54-72] 54 (03/29 0300) Resp:  [12-21] 15 (03/29 0300) BP: (89-140)/(41-70) 113/59 (03/29 0300) SpO2:  [91 %-98 %] 93 % (03/29 0300) Weight:  [73.3 kg] 73.3 kg (03/29 0300) Last BM Date: 02/17/21  Weight change: Filed Weights   02/18/21 0300 02/19/21 0303 02/20/21 0300  Weight: 71.8 kg 75 kg 73.3 kg    Intake/Output:   Intake/Output Summary (Last 24 hours) at 02/20/2021 0708 Last data filed at 02/19/2021 2300 Gross per 24 hour  Intake 1601.24 ml  Output 400 ml  Net 1201.24 ml      Physical Exam   CVP 5-6  General:  Well appearing, sitting up in bed. No respiratory difficulty HEENT: normal Neck: supple. no JVD. Carotids 2+ bilat; no bruits. No lymphadenopathy or thyromegaly appreciated. Cor: PMI nondisplaced. Regular rate & rhythm. No rubs, gallops or murmurs. Lungs: clear Abdomen: soft, nontender, nondistended. No hepatosplenomegaly. No bruits or masses. Good bowel sounds. Extremities: no cyanosis, clubbing, rash, edema Neuro: alert & oriented x 3, cranial  nerves grossly intact. moves all 4 extremities w/o difficulty. Affect pleasant.   Telemetry   NSR/SB 50s-60s, no further A tach (personally reviewed)  EKG    n/a  Labs    CBC Recent Labs    02/18/21 0352 02/19/21 0930 02/20/21 0500  WBC 11.6*  --  8.9  NEUTROABS 8.3*  --   --   HGB 12.6 12.6 10.8*  HCT 36.1 37.0 32.0*  MCV 86.2  --  87.7  PLT 364  --  170   Basic Metabolic Panel Recent Labs    02/18/21 0352 02/19/21 0414 02/19/21 0930 02/20/21 0500  NA 128* 125* 131* 129*  K 4.1 4.3 3.9 4.6  CL 87* 88*  --  95*  CO2 31 28  --  28  GLUCOSE 113* 116*  --  107*  BUN 20 24*  --  24*  CREATININE 1.45* 1.67*  --  1.70*  CALCIUM 8.7* 8.7*  --  8.2*  MG 2.4  --   --   --    Liver Function Tests No results for input(s): AST, ALT, ALKPHOS, BILITOT, PROT, ALBUMIN in the last 72 hours. No results for input(s): LIPASE, AMYLASE in the last 72 hours. Cardiac Enzymes No results for input(s): CKTOTAL, CKMB, CKMBINDEX, TROPONINI in the last 72 hours.  BNP: BNP (last 3 results) Recent Labs    04/13/20 0053 06/09/20 0358 02/11/21 2130  BNP 364.0* 260.0* 1,760.0*    ProBNP (last 3 results)  No results for input(s): PROBNP in the last 8760 hours.   D-Dimer No results for input(s): DDIMER in the last 72 hours. Hemoglobin A1C No results for input(s): HGBA1C in the last 72 hours. Fasting Lipid Panel No results for input(s): CHOL, HDL, LDLCALC, TRIG, CHOLHDL, LDLDIRECT in the last 72 hours. Thyroid Function Tests No results for input(s): TSH, T4TOTAL, T3FREE, THYROIDAB in the last 72 hours.  Invalid input(s): FREET3  Other results:   Imaging    CARDIAC CATHETERIZATION  Result Date: 02/19/2021 1. Low filling pressures. 2. Preserved cardiac output. 3. No significant CAD. Nonischemic cardiomyopathy.  Will give back IV fluid.     Medications:     Scheduled Medications: . amiodarone  200 mg Oral BID  . atorvastatin  80 mg Oral Daily  . Chlorhexidine  Gluconate Cloth  6 each Topical Daily  . dapagliflozin propanediol  10 mg Oral Daily  . heparin  5,000 Units Subcutaneous Q8H  . insulin aspart  0-5 Units Subcutaneous QHS  . insulin aspart  0-9 Units Subcutaneous TID WC  . isosorbide-hydrALAZINE  1 tablet Oral TID  . pantoprazole  40 mg Oral Daily  . sacubitril-valsartan  1 tablet Oral BID  . sodium chloride flush  10-40 mL Intracatheter Q12H  . sodium chloride flush  3 mL Intravenous Q12H  . sodium chloride flush  3 mL Intravenous Q12H    Infusions: . sodium chloride      PRN Medications: sodium chloride, acetaminophen, albuterol, bisacodyl, ondansetron (ZOFRAN) IV, polyethylene glycol, sodium chloride flush, sodium chloride flush, traZODone   Assessment/Plan   1. Acute on chronic systolic CHF:  Nonischemic cardiomyopathy based on 5/21 cath but echo at that time showed EF higher at 45%.  She was admitted 3/20 with volume overload and AFL versus AT with HR in 150s.  Echo this admission with EF 25-30%, moderate RV enlargement and moderately decreased RV systolic function with D-shaped septum, biatrial enlargement. Initial hypotension and AKI in setting of tachycardia.  CMRI with LV EF 20% RV EF 26%, technically difficult but there was delayed enhancement suggestive of possible coronary disease-type pattern, however LHC showed no CAD. RHC showed low filling pressures and preserved CO. Co-ox 68% today. CVP 5-6 - Hold torsemide again today, can resume at home tomorrow  - Continue Farxiga 10 mg daily.   - Bidil  1 tab tid.  - Continue spiro 25 mg daily.  - Continue Entresto 24/26 bid. 2. Rhythm: Atrial tachycardia noted this admission.  Also with post-termination pauses and episodes of junctional brady apparently at [facility]. Currently in NSR  - EP consulted---> Atrial Tach. No plan for pacemaker at this time. Ablation would likely be difficult.  - Continue po amiodarone.  3. AKI: Baseline creatinine around 1.  Suspect AKI due to  tachycardia/hypotension at [facility].  Creatinine as high as 2.2, now 1.7 (stable past 24 hr) - Hold torsemide today. Can resume tomorrow  - will need f/u outpatient BMP in 7 days  4. LBBB: has at baseline, QRS around 130 msec 5. Hyponatremia:  - improving, 125>>129   Likely home today. Will arrange post hospital f/u in the [facility].   Length of Stay: [address], PA-C  02/20/2021, 7:08 AM  Advanced Heart Failure Team Pager [phone] (M-F; 7a - 5p)  Please contact [facility] for night-coverage after hours (5p -7a ) and weekends on [email or web address]  Patient seen with PA, agree with the above note.    She is doing well today, she remains in  NSR.  CVP 5-6, co-ox 68%.  Sodium up to 129.   General: NAD Neck: No JVD, no thyromegaly or thyroid nodule.  Lungs: Clear to auscultation bilaterally with normal respiratory effort. CV: Nondisplaced PMI.  Heart regular S1/S2, no S3/S4, no murmur.  No peripheral edema.   Abdomen: Soft, nontender, no hepatosplenomegaly, no distention.  Skin: Intact without lesions or rashes.  Neurologic: Alert and oriented x 3.  Psych: Normal affect. Extremities: No clubbing or cyanosis.  HEENT: Normal.   Patient can go home today.  She will followup in 10-14 days in CHF clinic.  Continue amiodarone 200 mg bid for a total of 10 days then decrease to 200 mg daily.    Restart spironolactone 25 mg daily today.  Start on Lasix 20 mg daily tomorrow.   Loralie Champagne 02/20/2021 8:26 AM

## 2021-02-20 NOTE — Progress Notes (Signed)
RA TL PICC removed per protocol for patient's discharge.. Manual pressure applied for 5 mins. No bleeding or swelling noted. Instructed patient to remain in bed for thirty mins. Educated patient about S/S of infection and when to call MD; no heavy lifting or pressure on right side for 24 hours; keep dressing dry and intact for 24 hours. Pt verbalized comprehension.

## 2021-02-20 NOTE — Progress Notes (Signed)
RN went over discharge paperwork with pt. IV team removed PICC. Pt belongings with pt. Pt's daughter transporting pt home. Pt has note from PA so that she can request an apartment on a lower floor.

## 2021-02-20 NOTE — Plan of Care (Signed)

## 2021-02-20 NOTE — Care Management Important Message (Signed)
Important Message  Patient Details  Name: Michelle Morrison MRN: [id number] Date of Birth: [birth year]   Medicare Important Message Given:  Yes     Romaine Maciolek 02/20/2021, 2:03 PM

## 2021-02-20 NOTE — Progress Notes (Signed)
CARDIAC REHAB PHASE I   PRE:  Rate/Rhythm: 51 SR  BP:  Supine:   Sitting: 132/60  Standing:    SaO2: 97%RA  MODE:  Ambulation: 400 ft   POST:  Rate/Rhythm: 67 SR  BP:  Supine:   Sitting: 124/48  Standing:    SaO2: 98%RA 0812-0832 Pt walked 400 ft on RA with steady gait and tolerated well. To recliner with call bell after walk. Discussed walking for ex and CRP 2. Will refer to [place] CRP 2 as pt will consider program. Pt voiced understanding and very excited to be going home.   Graylon Good, RN BSN  02/20/2021 8:29 AM

## 2021-02-27 ENCOUNTER — Encounter: Payer: Self-pay | Admitting: Obstetrics & Gynecology

## 2021-02-27 ENCOUNTER — Other Ambulatory Visit (HOSPITAL_COMMUNITY)
Admission: RE | Admit: 2021-02-27 | Discharge: 2021-02-27 | Disposition: A | Discharge status: Home / Self Care | Payer: Medicare Other | Source: Ambulatory Visit | Attending: Obstetrics & Gynecology | Admitting: Obstetrics & Gynecology

## 2021-02-27 ENCOUNTER — Ambulatory Visit (INDEPENDENT_AMBULATORY_CARE_PROVIDER_SITE_OTHER): Payer: Medicare Other | Admitting: Obstetrics & Gynecology

## 2021-02-27 ENCOUNTER — Other Ambulatory Visit: Payer: Self-pay

## 2021-02-27 VITALS — BP 192/83 | HR 71 | Ht 65.0 in | Wt 162.0 lb

## 2021-02-27 DIAGNOSIS — Z1211 Encounter for screening for malignant neoplasm of colon: Secondary | ICD-10-CM

## 2021-02-27 DIAGNOSIS — Z01419 Encounter for gynecological examination (general) (routine) without abnormal findings: Secondary | ICD-10-CM | POA: Insufficient documentation

## 2021-02-27 DIAGNOSIS — Z1212 Encounter for screening for malignant neoplasm of rectum: Secondary | ICD-10-CM

## 2021-02-27 NOTE — Progress Notes (Signed)
Subjective:     Michelle Morrison is a 60 y.o. female here for a routine exam.  Patient's last menstrual period was 05/23/2007. [id number] Birth Control Method:  menopausal Menstrual Calendar(currently): amenorrheic  Current complaints: none.   Current acute medical issues:  CHF   Recent Gynecologic History Patient's last menstrual period was 05/23/2007. Last Pap: 2020,  normal Last mammogram: 11/27/20,  normal  Past Medical History:  Diagnosis Date  . CHF (congestive heart failure) ([place])    a. EF 45% by echo in 03/2020 with normal cors by cath  . Diabetes mellitus without complication ([place])   . Diarrhea   . Herpes simplex infection   . Hyperlipidemia   . Hypertension     Past Surgical History:  Procedure Laterality Date  . CESAREAN SECTION    . CHOLECYSTECTOMY    . COLONOSCOPY N/A 08/26/2014   Procedure: COLONOSCOPY;  Surgeon: [name], MD;  Location: [facility];  Service: Endoscopy;  Laterality: N/A;  9:30 AM - moved to 8:30 - Ginger to notify pt  . LEFT HEART CATH AND CORONARY ANGIOGRAPHY N/A 04/14/2020   Procedure: LEFT HEART CATH AND CORONARY ANGIOGRAPHY;  Surgeon: [name], [name], MD;  Location: [place] [facility];  Service: Cardiovascular;  Laterality: N/A;  . RIGHT/LEFT HEART CATH AND CORONARY ANGIOGRAPHY N/A 02/19/2021   Procedure: RIGHT/LEFT HEART CATH AND CORONARY ANGIOGRAPHY;  Surgeon: [name], MD;  Location: [place] [facility];  Service: Cardiovascular;  Laterality: N/A;    OB History    Gravida  2   Para  2   Term  1   Preterm  1   AB      Living  2     SAB      IAB      Ectopic      Multiple      Live Births  2           Social History   Socioeconomic History  . Marital status: Single    Spouse name: Not on file  . Number of children: Not on file  . Years of education: Not on file  . Highest education level: Not on file  Occupational History  . Not on file  Tobacco Use  . Smoking status: Never Smoker  . Smokeless  tobacco: Never Used  Vaping Use  . Vaping Use: Never used  Substance and Sexual Activity  . Alcohol use: No    Alcohol/week: 0.0 standard drinks  . Drug use: No  . Sexual activity: Not Currently    Birth control/protection: Post-menopausal  Other Topics Concern  . Not on file  Social History Narrative  . Not on file   Social Determinants of Health   Financial Resource Strain: Low Risk   . Difficulty of Paying Living Expenses: Not hard at all  Food Insecurity: No Food Insecurity  . Worried About Charity fundraiser in the Last Year: Never true  . Ran Out of Food in the Last Year: Never true  Transportation Needs: No Transportation Needs  . Lack of Transportation (Medical): No  . Lack of Transportation (Non-Medical): No  Physical Activity: Sufficiently Active  . Days of Exercise per Week: 7 days  . Minutes of Exercise per Session: 30 min  Stress: No Stress Concern Present  . Feeling of Stress : Not at all  Social Connections: Unknown  . Frequency of Communication with Friends and Family: More than three times a week  . Frequency of  Social Gatherings with Friends and Family: Once a week  . Attends Religious Services: 1 to 4 times per year  . Active Member of Clubs or Organizations: No  . Attends Archivist Meetings: Never  . Marital Status: Patient refused    Family History  Problem Relation Age of Onset  . Hypertension Mother   . Diabetes Mother   . Colon cancer Neg Hx      Current Outpatient Medications:  .  albuterol (VENTOLIN HFA) 108 (90 Base) MCG/ACT inhaler, Inhale 1-2 puffs into the lungs every 4 (four) hours as needed., Disp: , Rfl:  .  amiodarone (PACERONE) 200 MG tablet, TAKE 1 TABLET 2 TIMES A DAY UNTIL 02/27/21, THEN REDUCE DOWN TO 1 TABLET ONCE DAILY, Disp: 90 tablet, Rfl: 2 .  atorvastatin (LIPITOR) 80 MG tablet, Take 80 mg by mouth daily., Disp: , Rfl:  .  dapagliflozin propanediol (FARXIGA) 10 MG TABS tablet, TAKE 1 TABLET (10 MG TOTAL) BY MOUTH  DAILY., Disp: 30 tablet, Rfl: 5 .  furosemide (LASIX) 20 MG tablet, TAKE 1 TABLET (20 MG TOTAL) BY MOUTH DAILY., Disp: 30 tablet, Rfl: 5 .  isosorbide-hydrALAZINE (BIDIL) 20-37.5 MG tablet, TAKE 1 TABLET BY MOUTH 3 (THREE) TIMES DAILY., Disp: 90 tablet, Rfl: 5 .  sacubitril-valsartan (ENTRESTO) 24-26 MG, TAKE 1 TABLET BY MOUTH 2 (TWO) TIMES DAILY., Disp: 60 tablet, Rfl: 5 .  spironolactone (ALDACTONE) 25 MG tablet, TAKE 1 TABLET (25 MG TOTAL) BY MOUTH DAILY., Disp: 30 tablet, Rfl: 5  Review of Systems  Review of Systems  Constitutional: Negative for fever, chills, weight loss, malaise/fatigue and diaphoresis.  HENT: Negative for hearing loss, ear pain, nosebleeds, congestion, sore throat, neck pain, tinnitus and ear discharge.   Eyes: Negative for blurred vision, double vision, photophobia, pain, discharge and redness.  Respiratory: Negative for cough, hemoptysis, sputum production, shortness of breath, wheezing and stridor.   Cardiovascular: Negative for chest pain, palpitations, orthopnea, claudication, leg swelling and PND.  Gastrointestinal: negative for abdominal pain. Negative for heartburn, nausea, vomiting, diarrhea, constipation, blood in stool and melena.  Genitourinary: Negative for dysuria, urgency, frequency, hematuria and flank pain.  Musculoskeletal: Negative for myalgias, back pain, joint pain and falls.  Skin: Negative for itching and rash.  Neurological: Negative for dizziness, tingling, tremors, sensory change, speech change, focal weakness, seizures, loss of consciousness, weakness and headaches.  Endo/Heme/Allergies: Negative for environmental allergies and polydipsia. Does not bruise/bleed easily.  Psychiatric/Behavioral: Negative for depression, suicidal ideas, hallucinations, memory loss and substance abuse. The patient is not nervous/anxious and does not have insomnia.        Objective:  Blood pressure (!) 192/83, pulse 71, height 5\' 5"  (1.651 m), weight 162 lb (73.5  kg), last menstrual period 05/23/2007.   Physical Exam  Vitals reviewed. Constitutional: She is oriented to person, place, and time. She appears well-developed and well-nourished.  HENT:  Head: Normocephalic and atraumatic.        Right Ear: External ear normal.  Left Ear: External ear normal.  Nose: Nose normal.  Mouth/Throat: Oropharynx is clear and moist.  Eyes: Conjunctivae and EOM are normal. Pupils are equal, round, and reactive to light. Right eye exhibits no discharge. Left eye exhibits no discharge. No scleral icterus.  Neck: Normal range of motion. Neck supple. No tracheal deviation present. No thyromegaly present.  Cardiovascular: Normal rate, regular rhythm, normal heart sounds and intact distal pulses.  Exam reveals no gallop and no friction rub.   No murmur heard. Respiratory: Effort normal and  breath sounds normal. No respiratory distress. She has no wheezes. She has no rales. She exhibits no tenderness.  GI: Soft. Bowel sounds are normal. She exhibits no distension and no mass. There is no tenderness. There is no rebound and no guarding.  Genitourinary:  Breasts no masses skin changes or nipple changes bilaterally      Vulva is normal without lesions Vagina is pink moist without discharge Cervix normal in appearance and pap is done Uterus is normal size shape and contour Adnexa is negative with normal sized ovaries  {Rectal    hemoccult negative, normal tone, no masses  Musculoskeletal: Normal range of motion. She exhibits no edema and no tenderness.  Neurological: She is alert and oriented to person, place, and time. She has normal reflexes. She displays normal reflexes. No cranial nerve deficit. She exhibits normal muscle tone. Coordination normal.  Skin: Skin is warm and dry. No rash noted. No erythema. No pallor.  Psychiatric: She has a normal mood and affect. Her behavior is normal. Judgment and thought content normal.       Medications Ordered at today's  visit: No orders of the defined types were placed in this encounter.   Other orders placed at today's visit: No orders of the defined types were placed in this encounter.     Assessment:    Normal Gyn exam.    Plan:    Contraception: none. Mammogram ordered.   3 year follow up  No follow-ups on file.

## 2021-03-05 DIAGNOSIS — I471 Supraventricular tachycardia: Secondary | ICD-10-CM | POA: Diagnosis not present

## 2021-03-05 DIAGNOSIS — I5023 Acute on chronic systolic (congestive) heart failure: Secondary | ICD-10-CM | POA: Diagnosis not present

## 2021-03-05 DIAGNOSIS — I10 Essential (primary) hypertension: Secondary | ICD-10-CM | POA: Diagnosis not present

## 2021-03-05 DIAGNOSIS — E1165 Type 2 diabetes mellitus with hyperglycemia: Secondary | ICD-10-CM | POA: Diagnosis not present

## 2021-03-05 LAB — CYTOLOGY - PAP
Adequacy: ABSENT
Chlamydia: NEGATIVE
Comment: NEGATIVE
Comment: NEGATIVE
Comment: NEGATIVE
Comment: NORMAL
HPV 16: NEGATIVE
HPV 18 / 45: NEGATIVE
High risk HPV: POSITIVE — AB
Neisseria Gonorrhea: NEGATIVE

## 2021-03-06 ENCOUNTER — Other Ambulatory Visit: Payer: Self-pay

## 2021-03-06 ENCOUNTER — Encounter (HOSPITAL_COMMUNITY): Payer: Self-pay | Admitting: Cardiology

## 2021-03-06 ENCOUNTER — Ambulatory Visit (HOSPITAL_COMMUNITY)
Admit: 2021-03-06 | Discharge: 2021-03-06 | Disposition: A | Discharge status: Home / Self Care | Payer: Medicare Other | Attending: Cardiology | Admitting: Cardiology

## 2021-03-06 VITALS — BP 150/80 | HR 53 | Wt 162.2 lb

## 2021-03-06 DIAGNOSIS — Z79899 Other long term (current) drug therapy: Secondary | ICD-10-CM | POA: Insufficient documentation

## 2021-03-06 DIAGNOSIS — N183 Chronic kidney disease, stage 3 unspecified: Secondary | ICD-10-CM | POA: Diagnosis not present

## 2021-03-06 DIAGNOSIS — I4891 Unspecified atrial fibrillation: Secondary | ICD-10-CM

## 2021-03-06 DIAGNOSIS — I5042 Chronic combined systolic (congestive) and diastolic (congestive) heart failure: Secondary | ICD-10-CM | POA: Diagnosis not present

## 2021-03-06 DIAGNOSIS — I471 Supraventricular tachycardia: Secondary | ICD-10-CM

## 2021-03-06 DIAGNOSIS — I130 Hypertensive heart and chronic kidney disease with heart failure and stage 1 through stage 4 chronic kidney disease, or unspecified chronic kidney disease: Secondary | ICD-10-CM | POA: Insufficient documentation

## 2021-03-06 DIAGNOSIS — I428 Other cardiomyopathies: Secondary | ICD-10-CM | POA: Diagnosis not present

## 2021-03-06 LAB — COMPREHENSIVE METABOLIC PANEL
ALT: 12 U/L (ref 0–44)
AST: 17 U/L (ref 15–41)
Albumin: 3.7 g/dL (ref 3.5–5.0)
Alkaline Phosphatase: 68 U/L (ref 38–126)
Anion gap: 7 (ref 5–15)
BUN: 16 mg/dL (ref 6–20)
CO2: 26 mmol/L (ref 22–32)
Calcium: 9 mg/dL (ref 8.9–10.3)
Chloride: 102 mmol/L (ref 98–111)
Creatinine, Ser: 1.09 mg/dL — ABNORMAL HIGH (ref 0.44–1.00)
GFR, Estimated: 59 mL/min — ABNORMAL LOW (ref >60)
Glucose, Bld: 125 mg/dL — ABNORMAL HIGH (ref 70–99)
Potassium: 4.2 mmol/L (ref 3.5–5.1)
Sodium: 135 mmol/L (ref 135–145)
Total Bilirubin: 0.3 mg/dL (ref 0.3–1.2)
Total Protein: 7.7 g/dL (ref 6.5–8.1)

## 2021-03-06 LAB — TSH: TSH: 1.951 u[IU]/mL (ref 0.350–4.500)

## 2021-03-06 MED ORDER — ENTRESTO 49-51 MG PO TABS: 1.0000 | ORAL_TABLET | Freq: Two times a day (BID) | ORAL | 3 refills | Status: DC

## 2021-03-06 NOTE — Patient Instructions (Signed)
Stop Furosemide  Increase Entresto to 49/51 mg Twice daily   Labs done today, your results will be available in MyChart, we will contact you for abnormal readings.  Your physician recommends that you return for lab work in: 10-14 days, we have provided you a prescription to have this done locally  Genetic test has been done, this has to be sent to [place] to be processed and can take 1-2 weeks to get results back.  We will let you know the results.  Your provider has requested you have a High Resolution CT Scan of you chest, once approved by your insurance company we will call you to schedule  Please follow up with our heart failure pharmacist in 3-4 weeks  Your physician recommends that you schedule a follow-up appointment in: 6-8 weeks  If you have any questions or concerns before your next appointment please send [place] a message through [place] or call our office at [phone].    TO LEAVE A MESSAGE FOR THE NURSE SELECT OPTION 2, PLEASE LEAVE A MESSAGE INCLUDING: . YOUR NAME . DATE OF BIRTH . CALL BACK NUMBER . REASON FOR CALL**this is important as we prioritize the call backs  [place] AS LONG AS YOU CALL BEFORE 4:00 PM  At the [facility], you and your health needs are our priority. As part of our continuing mission to provide you with exceptional heart care, we have created designated Provider Care Teams. These Care Teams include your primary Cardiologist (physician) and Advanced Practice Providers (APPs- Physician Assistants and Nurse Practitioners) who all work together to provide you with the care you need, when you need it.   You may see any of the following providers on your designated Care Team at your next follow up: Marland Kitchen Dr Glori Bickers . Dr Loralie Champagne . Dr Vickki Muff . Darrick Grinder, NP . Lyda Jester, [place] . Audry Riles, PharmD   Please be sure to bring in all your medications  bottles to every appointment.

## 2021-03-06 NOTE — Progress Notes (Signed)
Blood collected for TTR genetic testing per Dr McLean.  Order form completed and both shipped by [employer] to [facility].  

## 2021-03-06 NOTE — Progress Notes (Signed)
PCP: Rosita Fire, MD Cardiology: Dr [name]  60 y.o. with history of HTN, SVT, and mild nonischemic cardiomopathy.  In 5/21, echo showed EF 45% with severe anteroseptal hypokinesis.  LHC in 5/21 showed normal coronaries.  She has LBBB at baseline.  She has been noted to have SVT in the past.  On 02/11/21, she went to the ER at [facility] with abdominal pain and distention.  She was noted to be in atrial tachycardia with rate around 150.  She was hypotensive and got IVF, was also transiently on norepinephrine.  HR dropped transiently to the 40s with junctional bradycardia per report. AKI with creatinine up to 2.21.  Echo in 3/22 showed EF 25-30%, moderate RV enlargement and moderately decreased RV systolic function with D-shaped septum, biatrial enlargement.  Concern for tachy-brady syndrome, she was transferred to [facility] for further evaluation.  No bradycardia noted at [facility], and she remained in NSR on amiodarone.  EP thought that ablation of the atrial tachycardia would be difficult and recommended ongoing management with amiodarone.  LHC/RHC after diuresis showed normal filling pressures, preserved cardiac output, and no significant CAD.  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   She has been doing well since going home.  She is in an ectopic atrial rhythm today, rate 53 on ECG.  No dyspnea with her daily activities except with climbing stairs.  No lightheadedness.  No chest pain.  No orthopnea/PND.   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   Labs (3/22): K 4.6, creatinine 1.7  Past Medical History: 1. SVT 2. HTN 3. Nonischemic cardiomyopathy: Echo in 5/21 with EF 45%, severe anteroseptal HK.  - LHC (5/21): normal coronaries - Echo (3/22): EF 25-30%, moderate RV enlargement and moderately decreased RV systolic function with D-shaped septum, biatrial enlargement.  - RHC/LHC (3/22): No significant coronary disease; mean RA 1,  PA 35/7, mean PCWP 4, CI 3.5 F, 2.8 T. - Cardiac MRI (3/22): Normal LV size with EF 20%, diffuse hypokinesis with septal-lateral dyssynchrony c/w LBBB. Normal RV size with EF 26%.  Difficult delayed enhancement imaging, however there does appear to be LGE in the septum that could be in a coronary disease-type pattern (<50% wall thickness subendocardial LGE in the mid-apical septal wall).  FH: Mother and niece with CHF.   Social History   Socioeconomic History  . Marital status: Single    Spouse name: Not on file  . Number of children: Not on file  . Years of education: Not on file  . Highest education level: Not on file  Occupational History  . Not on file  Tobacco Use  . Smoking status: Never Smoker  . Smokeless tobacco: Never Used  Vaping Use  . Vaping Use: Never used  Substance and Sexual Activity  . Alcohol use: No    Alcohol/week: 0.0 standard drinks  . Drug use: No  . Sexual activity: Not Currently    Birth control/protection: Post-menopausal  Other Topics Concern  . Not on file  Social History Narrative  . Not on file   Social Determinants of Health   Financial Resource Strain: Low Risk   . Difficulty of Paying Living Expenses: Not hard at all  Food Insecurity: No Food Insecurity  . Worried About Charity fundraiser in the Last Year: Never true  . Ran Out of Food in the Last Year: Never true  Transportation Needs: No Transportation Needs  . Lack  of Transportation (Medical): No  . Lack of Transportation (Non-Medical): No  Physical Activity: Sufficiently Active  . Days of Exercise per Week: 7 days  . Minutes of Exercise per Session: 30 min  Stress: No Stress Concern Present  . Feeling of Stress : Not at all  Social Connections: Unknown  . Frequency of Communication with Friends and Family: More than three times a week  . Frequency of Social Gatherings with Friends and Family: Once a week  . Attends Religious Services: 1 to 4 times per year  . Active Member of  Clubs or Organizations: No  . Attends Archivist Meetings: Never  . Marital Status: Patient refused  Intimate Partner Violence: Not At Risk  . Fear of Current or Ex-Partner: No  . Emotionally Abused: No  . Physically Abused: No  . Sexually Abused: No   ROS: All systems reviewed and negative except as per HPI.   Current Outpatient Medications  Medication Sig Dispense Refill  . albuterol (VENTOLIN HFA) 108 (90 Base) MCG/ACT inhaler Inhale 1-2 puffs into the lungs every 4 (four) hours as needed.    Marland Kitchen amiodarone (PACERONE) 200 MG tablet Take 200 mg by mouth daily.    Marland Kitchen atorvastatin (LIPITOR) 80 MG tablet Take 80 mg by mouth daily.    . dapagliflozin propanediol (FARXIGA) 10 MG TABS tablet TAKE 1 TABLET (10 MG TOTAL) BY MOUTH DAILY. 30 tablet 5  . isosorbide-hydrALAZINE (BIDIL) 20-37.5 MG tablet TAKE 1 TABLET BY MOUTH 3 (THREE) TIMES DAILY. 90 tablet 5  . sacubitril-valsartan (ENTRESTO) 49-51 MG Take 1 tablet by mouth 2 (two) times daily. 60 tablet 3  . spironolactone (ALDACTONE) 25 MG tablet TAKE 1 TABLET (25 MG TOTAL) BY MOUTH DAILY. 30 tablet 5   No current facility-administered medications for this encounter.   BP (!) 150/80   Pulse (!) 53   Wt 73.6 kg   LMP 05/23/2007   SpO2 96%   BMI 26.99 kg/m  General: NAD Neck: No JVD, no thyromegaly or thyroid nodule.  Lungs: Clear to auscultation bilaterally with normal respiratory effort. CV: Nondisplaced PMI.  Heart regular S1/S2, no S3/S4, no murmur.  No peripheral edema.  No carotid bruit.  Normal pedal pulses.  Abdomen: Soft, nontender, no hepatosplenomegaly, no distention.  Skin: Intact without lesions or rashes.  Neurologic: Alert and oriented x 3.  Psych: Normal affect. Extremities: No clubbing or cyanosis.  HEENT: Normal.   Assessment/Plan: 1. Chronic systolic CHF: Nonischemic cardiomyopathy based on 5/21 cath, echo at that time showed EF 45%. She was admitted 3/22 with volume overload and atrial tachycardia with  HR in 150s. Back in NSR with amiodarone.Echo in 3/22 with EF 25-30%, moderate RV enlargement and moderately decreased RV systolic function with D-shaped septum, biatrial enlargement. CMRI with LV EF 20% RV EF 26%, technically difficult but there was delayed enhancement suggestive of possible coronary disease-type pattern, however LHC repeated in 3/22 showed no CAD. RHC showed low filling pressures and preserved CO.  Cause of cardiomyopathy uncertain, cannot rule out cardiac sarcoidosis as cause of MRI pattern.  Tachy-mediated component is certainly possible at well.  She also seems to have a family history of CHF.   NYHA class II, she is not volume overloaded on exam.  - She can stop Lasix.  - Continue Farxiga 10 mg daily.   - Continue Bidil 1 tab tid.  - Continue spiro 25 mg daily.  - Increase Entresto to 49/51 bid.  BMET today and in 10 days.  -  Repeat echo in 6 months after medication titration.  ECG with IVCD 134 msec, not clear that she would benefit from CRT.  - Send [facility] gene testing for cardiomyopathies given family history.  - Evaluate for evidence of sarcoidosis, check ACE level and will get high resolution CT chest.  2. Atrial tachycardia with RVR: 3/22 admission.  May have contributed to cardiomyopathy.  Today, he is in an ectopic atrial rhythm but with HR in 50s.   - Continue amiodarone 200 mg daily, check LFTs and TSH and will need regular eye exam.  3. CKD: Stage 3.  BMET today.  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  Not clear that CRT would be helpful with QRS < 150 and not true LBBB.   Followup in 3 wks in pharmacy clinic for med titration.  Followup with me in 6 wks.   Michelle Morrison 03/06/2021

## 2021-03-07 ENCOUNTER — Telehealth: Payer: Self-pay | Admitting: *Deleted

## 2021-03-07 LAB — ANGIOTENSIN CONVERTING ENZYME: Angiotensin-Converting Enzyme: 26 U/L (ref 14–82)

## 2021-03-07 NOTE — Telephone Encounter (Signed)
LMOVM requesting patient return my call regarding pap smear results.

## 2021-03-08 ENCOUNTER — Telehealth: Payer: Self-pay | Admitting: Women's Health

## 2021-03-08 NOTE — Telephone Encounter (Signed)
Returning call to [facility] about PAP results

## 2021-03-08 NOTE — Telephone Encounter (Signed)
LMOVM returning her call.  Informed she needs a colpo per Dr Elonda Husky based on recent pap results.  Can call back to schedule procedure and if she has any questions.

## 2021-03-14 ENCOUNTER — Other Ambulatory Visit: Payer: Self-pay | Admitting: Pharmacist

## 2021-03-14 ENCOUNTER — Other Ambulatory Visit (HOSPITAL_COMMUNITY): Payer: Self-pay | Admitting: *Deleted

## 2021-03-14 MED ORDER — AMIODARONE HCL 200 MG PO TABS: 200.0000 mg | ORAL_TABLET | Freq: Every day | ORAL | 1 refills | Status: DC

## 2021-03-14 MED ORDER — SPIRONOLACTONE 25 MG PO TABS
1.0000 | ORAL_TABLET | Freq: Every day | ORAL | 6 refills | Status: DC
Start: 2021-03-14 — End: 2021-12-10

## 2021-03-14 MED ORDER — ISOSORB DINITRATE-HYDRALAZINE 20-37.5 MG PO TABS: 1.0000 | ORAL_TABLET | Freq: Three times a day (TID) | ORAL | 6 refills | Status: DC

## 2021-03-14 MED ORDER — DAPAGLIFLOZIN PROPANEDIOL 10 MG PO TABS: ORAL_TABLET | Freq: Every day | ORAL | 6 refills | Status: DC

## 2021-03-14 MED ORDER — ATORVASTATIN CALCIUM 80 MG PO TABS
80.0000 mg | ORAL_TABLET | Freq: Every day | ORAL | 3 refills | Status: AC
Start: 2021-03-14 — End: ?

## 2021-03-16 DIAGNOSIS — I5042 Chronic combined systolic (congestive) and diastolic (congestive) heart failure: Secondary | ICD-10-CM | POA: Diagnosis not present

## 2021-03-21 ENCOUNTER — Ambulatory Visit (HOSPITAL_COMMUNITY)
Admission: RE | Admit: 2021-03-21 | Discharge: 2021-03-21 | Disposition: A | Discharge status: Home / Self Care | Payer: Medicare Other | Source: Ambulatory Visit | Attending: Cardiology | Admitting: Cardiology

## 2021-03-21 ENCOUNTER — Encounter (HOSPITAL_COMMUNITY): Payer: Self-pay

## 2021-03-21 ENCOUNTER — Other Ambulatory Visit: Payer: Self-pay

## 2021-03-21 DIAGNOSIS — I700 Atherosclerosis of aorta: Secondary | ICD-10-CM | POA: Diagnosis not present

## 2021-03-21 DIAGNOSIS — M47819 Spondylosis without myelopathy or radiculopathy, site unspecified: Secondary | ICD-10-CM | POA: Diagnosis not present

## 2021-03-21 DIAGNOSIS — J984 Other disorders of lung: Secondary | ICD-10-CM | POA: Diagnosis not present

## 2021-03-21 DIAGNOSIS — I5042 Chronic combined systolic (congestive) and diastolic (congestive) heart failure: Secondary | ICD-10-CM | POA: Insufficient documentation

## 2021-03-21 DIAGNOSIS — Z9049 Acquired absence of other specified parts of digestive tract: Secondary | ICD-10-CM | POA: Diagnosis not present

## 2021-03-23 ENCOUNTER — Other Ambulatory Visit: Payer: Self-pay

## 2021-03-23 ENCOUNTER — Ambulatory Visit (INDEPENDENT_AMBULATORY_CARE_PROVIDER_SITE_OTHER): Payer: Medicare Other | Admitting: Obstetrics & Gynecology

## 2021-03-23 ENCOUNTER — Telehealth (HOSPITAL_COMMUNITY): Payer: Self-pay

## 2021-03-23 ENCOUNTER — Encounter: Payer: Self-pay | Admitting: Obstetrics & Gynecology

## 2021-03-23 ENCOUNTER — Other Ambulatory Visit (HOSPITAL_COMMUNITY): Payer: Self-pay | Admitting: Cardiology

## 2021-03-23 DIAGNOSIS — E041 Nontoxic single thyroid nodule: Secondary | ICD-10-CM

## 2021-03-23 DIAGNOSIS — N870 Mild cervical dysplasia: Secondary | ICD-10-CM | POA: Diagnosis not present

## 2021-03-23 NOTE — Progress Notes (Signed)
    Colposcopy Procedure Note:    Colposcopy Procedure Note  Indications:  2022 LSIL/+ HR HPV/negative 16/18 20 normal 18 normal    2019 ASCCP recommendation: colposcopy  Smoker:  No. New sexual partner:  No.  : time frame:    History of abnormal Pap: yes  Procedure Details  The risks and benefits of the procedure and Written informed consent obtained.  Speculum placed in vagina and excellent visualization of cervix achieved, cervix swabbed x 3 with acetic acid solution.  Findings: Adequate colposcopy is noted today.  Cervix: no visible lesions, no mosaicism, no punctation and no abnormal vasculature; SCJ visualized 360 degrees without lesions and no biopsies taken. Vaginal inspection: normal without visible lesions. Vulvar colposcopy: vulvar colposcopy not performed.  Specimens: none  Complications: none.  Colposcopic Impression:   Plan(Based on 2019 ASCCP recommendations) Repeat HPV based cytology 1 year

## 2021-03-23 NOTE — Telephone Encounter (Signed)
Pt aware of results and recommendations for additional ultrasound.  Ultrasound ordered. Pt to await call for scheduling. Verbalized understanding.

## 2021-03-23 NOTE — Telephone Encounter (Signed)
-----   Message from [name], MD sent at 03/22/2021  1:52 PM EDT ----- No interstitial lung disease.  She has a thyroid nodule, will need a thyroid ultrasound to fully assess (please order).

## 2021-03-29 ENCOUNTER — Ambulatory Visit (HOSPITAL_COMMUNITY)
Admission: RE | Admit: 2021-03-29 | Discharge: 2021-03-29 | Disposition: A | Discharge status: Home / Self Care | Payer: Medicare Other | Source: Ambulatory Visit | Attending: Cardiology | Admitting: Cardiology

## 2021-03-29 ENCOUNTER — Other Ambulatory Visit: Payer: Self-pay

## 2021-03-29 ENCOUNTER — Encounter (HOSPITAL_COMMUNITY): Payer: Self-pay

## 2021-03-29 VITALS — BP 140/76 | HR 58 | Wt 160.0 lb

## 2021-03-29 DIAGNOSIS — I471 Supraventricular tachycardia: Secondary | ICD-10-CM | POA: Insufficient documentation

## 2021-03-29 DIAGNOSIS — I130 Hypertensive heart and chronic kidney disease with heart failure and stage 1 through stage 4 chronic kidney disease, or unspecified chronic kidney disease: Secondary | ICD-10-CM | POA: Diagnosis not present

## 2021-03-29 DIAGNOSIS — I428 Other cardiomyopathies: Secondary | ICD-10-CM | POA: Diagnosis not present

## 2021-03-29 DIAGNOSIS — I5043 Acute on chronic combined systolic (congestive) and diastolic (congestive) heart failure: Secondary | ICD-10-CM | POA: Insufficient documentation

## 2021-03-29 DIAGNOSIS — I447 Left bundle-branch block, unspecified: Secondary | ICD-10-CM | POA: Insufficient documentation

## 2021-03-29 DIAGNOSIS — N183 Chronic kidney disease, stage 3 unspecified: Secondary | ICD-10-CM | POA: Insufficient documentation

## 2021-03-29 LAB — BASIC METABOLIC PANEL
Anion gap: 6 (ref 5–15)
BUN: 19 mg/dL (ref 6–20)
CO2: 25 mmol/L (ref 22–32)
Calcium: 9 mg/dL (ref 8.9–10.3)
Chloride: 106 mmol/L (ref 98–111)
Creatinine, Ser: 1.11 mg/dL — ABNORMAL HIGH (ref 0.44–1.00)
GFR, Estimated: 57 mL/min — ABNORMAL LOW (ref >60)
Glucose, Bld: 103 mg/dL — ABNORMAL HIGH (ref 70–99)
Potassium: 5 mmol/L (ref 3.5–5.1)
Sodium: 137 mmol/L (ref 135–145)

## 2021-03-29 MED ORDER — ISOSORB DINITRATE-HYDRALAZINE 20-37.5 MG PO TABS: 2.0000 | ORAL_TABLET | Freq: Three times a day (TID) | ORAL | 5 refills | Status: DC

## 2021-03-29 NOTE — Patient Instructions (Signed)
It was a pleasure seeing you today!  MEDICATIONS: -We are changing your medications today -Increase BiDil to 2 tablets by mouth three times daily -Call if you have questions about your medications.  LABS: -We will call you if your labs need attention.  NEXT APPOINTMENT: Return to clinic in 1 month with Dr. [name].  In general, to take care of your heart failure: -Limit your fluid intake to 2 Liters (half-gallon) per day.   -Limit your salt intake to ideally 2-3 grams (2000-3000 mg) per day. -Weigh yourself daily and record, and bring that "weight diary" to your next appointment.  (Weight gain of 2-3 pounds in 1 day typically means fluid weight.) -The medications for your heart are to help your heart and help you live longer.   -Please contact us before stopping any of your heart medications.  Call the clinic at [phone] with questions or to reschedule future appointments.

## 2021-03-29 NOTE — Progress Notes (Addendum)
PCP: Rosita Fire, MD PCP Cardiologist: Dr. [name]    HPI:   60 y.o. with history of HTN, SVT, and mild nonischemic cardiomopathy. In 5/21, echo showed EF 45% with severe anteroseptal hypokinesis. LHC in 5/21 showed normal coronaries. She has LBBB at baseline. She has been noted to have SVT in the past. On 02/11/21, she went to the ER at [facility] with abdominal pain and distention. She was noted to be in atrial tachycardia with rate around 150. She was hypotensive and got IVF, was also transiently on norepinephrine. HR dropped transiently to the 40s with junctional bradycardia per report. AKI with creatinine up to 2.21. Echo in 3/22 showed EF 25-30%, moderate RV enlargement and moderately decreased RV systolic function with D-shaped septum, biatrial enlargement. 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 No bradycardia noted at [facility], and she remained in NSR on amiodarone.  EP thought that ablation of the atrial tachycardia would be difficult and recommended ongoing management with amiodarone.  LHC/RHC after diuresis showed normal filling pressures, preserved cardiac output, and no significant CAD.  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 ACE level was 26. High resolution CT scan showed no evidence of interstitial lung disease or sarcoid.   Today she returns to HF clinic for pharmacist medication titration. At last visit with Dr. [name] on 03/06/21, she was euvolemic on exam and lasix was discontinued. BP was elevated at 150/80 and Entresto was increased to 49-51 mg BID.   Today, she is feeling well overall. Reports some slight fatigue. Denies dizziness, lightheadedness, chest pain or palpitations. Denies shortness of breath. She is able to complete ADLs. She has been weighing herself daily at home and weights range from 160-162 lbs. Appears euvolemic on exam today. Reports low appetite, sometime only eating  1 meal per day. She has been following a low-sodium diet. She picks up her prescriptions from [place] and denies affordability issues with medications.    HF Medications: Entresto 49-51 mg BID Spironolactone 25 mg daily Farxiga 10 mg daily BiDil 20-37.5 mg TID  Has the patient been experiencing any side effects to the medications prescribed?  no  Does the patient have any problems obtaining medications due to transportation or finances?   no  Understanding of regimen: good Understanding of indications: good Potential of compliance: excellent Patient understands to avoid NSAIDs. Patient understands to avoid decongestants.    Pertinent Lab Values: . 4/22: Serum creatinine 0.99, BUN 14, Potassium 4.8, Sodium 138 . 5/5: Serum creatinine 1.11, BUN 19, Potassium 5.0, Sodium 137  Vital Signs: . Weight: 160 lbs (last clinic weight: 161.9) . Blood pressure: 140/76 mmHg  . Heart rate: 58 bpm  Assessment/Plan: 1. Chronic systolic CHF (EF 87%). NYHA class II symptoms. Nonischemic cardiomyopathy based on 5/21 cath, echo at that time showed EF 45%. She was admitted 3/22 with volume overload and atrial tachycardia with HR in 150s. Back in NSR with amiodarone.Echo in 3/22 with EF 25-30%, moderate RV enlargement and moderately decreased RV systolic function with D-shaped septum, biatrial enlargement. CMRI with LV EF 20% RV EF 26%, technically difficult but there was delayed enhancementsuggestive of possiblecoronary disease-type pattern, however LHC repeated in 3/22 showed no CAD. RHC showed low filling pressures and preserved CO.  High resolution CT scan showed no evidence of interstitial lung disease or sarcoid.  Tachy-mediated component is certainly possible at well.  She also seems to have a family history of CHF.  Today, she is not volume overloaded on exam.    - No diuretics needed at this time.   - Continue Entresto 49/51 mg BID.  - Continue spiro 25 mg daily. K up from 4.8 to  5.0 today after increasing Entresto and stopping lasix during last visit. Cautiously continue for now. Hold if K>5.5. Recommend to repeat BMET at next appt with Dr. Harl Bowie on 5/18. - Continue Farxiga 10 mg daily.  - Increase Bidil 20-37.5 mg to 2 tabs TID given elevated BP today - Repeat echo in 6 months after medication titration.  - Basic disease state pathophysiology, medication indication, mechanism and side effects reviewed at length with patient and he verbalized understanding  2. Atrial tachycardia with RVR: 3/22 admission.  May have contributed to cardiomyopathy.    - Continue amiodarone 200 mg daily; LFTs and TSH WNL - Will need regular eye exam.   3. CKD: Stage 3.  BMET today.   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  Not clear that CRT would be helpful with QRS < 150 and not true LBBB.    Follow up in 2 weeks with Dr. Harl Bowie + recommend adding BMET Follow up in 1 month with Dr. Joen Laura, PharmD PGY-1 [facility] Resident   Kerby Nora, PharmD, BCPS Advanced HF Clinic Pharmacist [phone]

## 2021-04-04 ENCOUNTER — Ambulatory Visit (HOSPITAL_COMMUNITY): Payer: Medicare Other

## 2021-04-04 DIAGNOSIS — I5042 Chronic combined systolic (congestive) and diastolic (congestive) heart failure: Secondary | ICD-10-CM | POA: Diagnosis not present

## 2021-04-04 DIAGNOSIS — E1165 Type 2 diabetes mellitus with hyperglycemia: Secondary | ICD-10-CM | POA: Diagnosis not present

## 2021-04-05 ENCOUNTER — Ambulatory Visit (HOSPITAL_COMMUNITY)
Admission: RE | Admit: 2021-04-05 | Discharge: 2021-04-05 | Disposition: A | Discharge status: Home / Self Care | Payer: Medicare Other | Source: Ambulatory Visit | Attending: Cardiology | Admitting: Cardiology

## 2021-04-05 ENCOUNTER — Other Ambulatory Visit: Payer: Self-pay

## 2021-04-05 DIAGNOSIS — E041 Nontoxic single thyroid nodule: Secondary | ICD-10-CM | POA: Diagnosis not present

## 2021-04-11 ENCOUNTER — Other Ambulatory Visit (HOSPITAL_COMMUNITY)
Admission: RE | Admit: 2021-04-11 | Discharge: 2021-04-11 | Disposition: A | Discharge status: Home / Self Care | Payer: Medicare Other | Source: Ambulatory Visit | Attending: Cardiology | Admitting: Cardiology

## 2021-04-11 ENCOUNTER — Ambulatory Visit (INDEPENDENT_AMBULATORY_CARE_PROVIDER_SITE_OTHER): Payer: Medicare Other | Admitting: Cardiology

## 2021-04-11 ENCOUNTER — Other Ambulatory Visit: Payer: Self-pay

## 2021-04-11 ENCOUNTER — Encounter: Payer: Self-pay | Admitting: Cardiology

## 2021-04-11 VITALS — BP 110/60 | HR 58 | Ht 65.5 in | Wt 164.0 lb

## 2021-04-11 DIAGNOSIS — I4719 Other supraventricular tachycardia: Secondary | ICD-10-CM

## 2021-04-11 DIAGNOSIS — I10 Essential (primary) hypertension: Secondary | ICD-10-CM | POA: Diagnosis not present

## 2021-04-11 DIAGNOSIS — I471 Supraventricular tachycardia: Secondary | ICD-10-CM | POA: Insufficient documentation

## 2021-04-11 DIAGNOSIS — I5022 Chronic systolic (congestive) heart failure: Secondary | ICD-10-CM | POA: Diagnosis not present

## 2021-04-11 LAB — BASIC METABOLIC PANEL
Anion gap: 7 (ref 5–15)
BUN: 18 mg/dL (ref 6–20)
CO2: 26 mmol/L (ref 22–32)
Calcium: 8.8 mg/dL — ABNORMAL LOW (ref 8.9–10.3)
Chloride: 100 mmol/L (ref 98–111)
Creatinine, Ser: 0.85 mg/dL (ref 0.44–1.00)
GFR, Estimated: >60 mL/min (ref >60)
Glucose, Bld: 106 mg/dL — ABNORMAL HIGH (ref 70–99)
Potassium: 4.5 mmol/L (ref 3.5–5.1)
Sodium: 133 mmol/L — ABNORMAL LOW (ref 135–145)

## 2021-04-11 LAB — MAGNESIUM: Magnesium: 1.9 mg/dL (ref 1.7–2.4)

## 2021-04-11 NOTE — Patient Instructions (Signed)
Your physician recommends that you continue on your current medications as directed. Please refer to the Current Medication list given to you today.   *If you need a refill on your cardiac medications before your next appointment, please call your pharmacy*   Lab Work: BMET MAGNESIUM If you have labs (blood work) drawn today and your tests are completely normal, you will receive your results only by: Marland Kitchen MyChart Message (if you have MyChart) OR . A paper copy in the mail If you have any lab test that is abnormal or we need to change your treatment, we will call you to review the results.   Testing/Procedures: None   Follow-Up: At [facility], you and your health needs are our priority.  As part of our continuing mission to provide you with exceptional heart care, we have created designated Provider Care Teams.  These Care Teams include your primary Cardiologist (physician) and Advanced Practice Providers (APPs -  Physician Assistants and Nurse Practitioners) who all work together to provide you with the care you need, when you need it.  We recommend signing up for the patient portal called "MyChart".  Sign up information is provided on this After Visit Summary.  MyChart is used to connect with patients for Virtual Visits (Telemedicine).  Patients are able to view lab/test results, encounter notes, upcoming appointments, etc.  Non-urgent messages can be sent to your provider as well.   To learn more about what you can do with MyChart, go to [email or web address].    Your next appointment:   6 month(s)  The format for your next appointment:   In Person  Provider:   Carlyle Dolly, MD   Other Instructions

## 2021-04-11 NOTE — Progress Notes (Signed)
Clinical Summary Michelle Morrison is a 60 y.o.female seen today for follow up of the following medical problem.s   1. Chronic systolic/diastolic HF - admit 06/2504 with pulmonary edema - EKG with LBBB, had WMA on echo referred for cath - 03/2020 cath: normal coronaries, LVEDP 10 - 03/2020 echo: LVEF 45%, anteroseptal wall hypokinesis, grade II diastolic dysfunction  - recurrent admission 05/2020 with HTN, volume overload - no recent edema. No SOB or DOE.   Admitted 01/2021 with acute on chronic HF 01/2021 echo: LVEF 25-30%, mod to severe RV dysfunction, mod to severe TR - difficult to diurese with low bp's, worsening renal function. Was transferred to [facility] for HF team evaluation - RHC/LHC low filling pressures PCWP 4, preserved CI at 3.5, no significant CAD - 01/2021 CMRI: LVEF 20%, non specific LGE pattern - discharge weight 161 lbs  - at 03/06/21 HF appt lasix stopped, entresto increased to 49/51mg  bid - at pharmacy appt 03/29/21 bidil increased to 20/37.5mg  2 tablets tid.  - no beta blocker due to bradycardia   - compliant with meds - no SOB/DOE, no LE edema  - home weights stable around 161 lbs   2. HTN - compliant with meds   3. Hyperlipidemia - compliant with meds.    4. PSVT/Atach - had some runs during prior hospital admission  - 04/2020 monitor Min HR 38, Max HR 163, Avg HR 72. Min HR occurred in early AM hours presumably while sleeping  Telemetry tracings show sinus rhythm and sinus bradycardia,several runs of atach with aberrancy up to 30 seconds.. Runs of atach followed by postconversion pauses up to 1.4 seconds.  5 beat run of NSVT  No symptoms reported   01/2021 admissoins issues with atach, difficult to control due to intermittent bradycardia - started on amio gtt, transitioned to oral amion -    Past Medical History:  Diagnosis Date  . CHF (congestive heart failure) ([place])    a. EF 45% by echo in 03/2020 with normal cors by cath  .  Diabetes mellitus without complication ([place])   . Diarrhea   . Herpes simplex infection   . Hyperlipidemia   . Hypertension      No Known Allergies   Current Outpatient Medications  Medication Sig Dispense Refill  . albuterol (VENTOLIN HFA) 108 (90 Base) MCG/ACT inhaler Inhale 1-2 puffs into the lungs every 4 (four) hours as needed.    Marland Kitchen amiodarone (PACERONE) 200 MG tablet Take 1 tablet (200 mg total) by mouth daily. 90 tablet 1  . atorvastatin (LIPITOR) 80 MG tablet Take 1 tablet (80 mg total) by mouth daily. 90 tablet 3  . dapagliflozin propanediol (FARXIGA) 10 MG TABS tablet TAKE 1 TABLET (10 MG TOTAL) BY MOUTH DAILY. 30 tablet 6  . isosorbide-hydrALAZINE (BIDIL) 20-37.5 MG tablet Take 2 tablets by mouth 3 (three) times daily. 180 tablet 5  . sacubitril-valsartan (ENTRESTO) 49-51 MG Take 1 tablet by mouth 2 (two) times daily. 60 tablet 3  . spironolactone (ALDACTONE) 25 MG tablet TAKE 1 TABLET (25 MG TOTAL) BY MOUTH DAILY. 30 tablet 6   No current facility-administered medications for this visit.     Past Surgical History:  Procedure Laterality Date  . CESAREAN SECTION    . CHOLECYSTECTOMY    . COLONOSCOPY N/A 08/26/2014   Procedure: COLONOSCOPY;  Surgeon: [name], MD;  Location: [facility];  Service: Endoscopy;  Laterality: N/A;  9:30 AM - moved to 8:30 - Ginger to notify pt  .  LEFT HEART CATH AND CORONARY ANGIOGRAPHY N/A 04/14/2020   Procedure: LEFT HEART CATH AND CORONARY ANGIOGRAPHY;  Surgeon: [name], [name], MD;  Location: [place] [facility];  Service: Cardiovascular;  Laterality: N/A;  . RIGHT/LEFT HEART CATH AND CORONARY ANGIOGRAPHY N/A 02/19/2021   Procedure: RIGHT/LEFT HEART CATH AND CORONARY ANGIOGRAPHY;  Surgeon: [name], MD;  Location: [place] [facility];  Service: Cardiovascular;  Laterality: N/A;     No Known Allergies    Family History  Problem Relation Age of Onset  . Hypertension Mother   . Diabetes Mother   . Colon cancer Neg Hx       Social History Michelle Morrison reports that she has never smoked. She has never used smokeless tobacco. Michelle Morrison reports no history of alcohol use.   Review of Systems CONSTITUTIONAL: No weight loss, fever, chills, weakness or fatigue.  HEENT: Eyes: No visual loss, blurred vision, double vision or yellow sclerae.No hearing loss, sneezing, congestion, runny nose or sore throat.  SKIN: No rash or itching.  CARDIOVASCULAR: per hpi RESPIRATORY: No shortness of breath, cough or sputum.  GASTROINTESTINAL: No anorexia, nausea, vomiting or diarrhea. No abdominal pain or blood.  GENITOURINARY: No burning on urination, no polyuria NEUROLOGICAL: No headache, dizziness, syncope, paralysis, ataxia, numbness or tingling in the extremities. No change in bowel or bladder control.  MUSCULOSKELETAL: No muscle, back pain, joint pain or stiffness.  LYMPHATICS: No enlarged nodes. No history of splenectomy.  PSYCHIATRIC: No history of depression or anxiety.  ENDOCRINOLOGIC: No reports of sweating, cold or heat intolerance. No polyuria or polydipsia.  Marland Kitchen   Physical Examination Today's Vitals   04/11/21 1302  BP: 110/60  Pulse: (!) 58  SpO2: 97%  Weight: 164 lb (74.4 kg)  Height: 5' 5.5" (1.664 m)   Body mass index is 26.88 kg/m.  Gen: resting comfortably, no acute distress HEENT: no scleral icterus, pupils equal round and reactive, no palptable cervical adenopathy,  CV: RRR, no m/r/g, no jvd Resp: Clear to auscultation bilaterally GI: abdomen is soft, non-tender, non-distended, normal bowel sounds, no hepatosplenomegaly MSK: extremities are warm, no edema.  Skin: warm, no rash Neuro:  no focal deficits Psych: appropriate affect   Diagnostic Studies  01/2021 echo IMPRESSIONS    1. Global hypokinesis with more prominent anteroseptal hypokinesis. .  Left ventricular ejection fraction, by estimation, is 25 to 30%. The left  ventricle has severely decreased function. The left ventricle  demonstrates  global hypokinesis. There is  moderate left ventricular hypertrophy. Left ventricular diastolic  parameters are indeterminate.  2. Right ventricular systolic function moderately to severely reduced.  The right ventricular size is severely enlarged. There is moderately  elevated pulmonary artery systolic pressure.  3. Left atrial size was severely dilated.  4. Right atrial size was severely dilated.  5. The mitral valve is normal in structure. No evidence of mitral valve  regurgitation. No evidence of mitral stenosis.  6. By color TR looks moderate. Hepatic systolic flow reversal would  suggest more severe TR. . The tricuspid valve is abnormal. Tricuspid valve  regurgitation is moderate to severe.  7. The aortic valve has an indeterminant number of cusps. Aortic valve  regurgitation is not visualized. No aortic stenosis is present.  8. The inferior vena cava is normal in size with <50% respiratory  variability, suggesting right atrial pressure of 8 mmHg.   01/2021 RHC/LHC 1. Low filling pressures.  2. Preserved cardiac output.  3. No significant CAD.    01/2021 CMRI  IMPRESSION: 1. Normal LV size with EF 20%, diffuse hypokinesis with septal-lateral dyssynchrony c/w LBBB.  2.  Normal RV size with EF 26%.  3. Diffuse delayed enhancement imaging, however there does appear to be LGE in the septum that could be in a coronary disease-type pattern.  Assessment and Plan  1. Chronic systolic - no rececent symptoms, apepars euvolemic. Actually not requiring a diuretic at this time. Home weights stable at 161 lbs which was her discharge weight - uptrend in K after starting aldactone, repeat labs. Pending trends could consider trial of higher dose entresto with close bp monitoring.  - has not been on beta blocker due to bradycardia.   2. Atach/PSVT - no symptoms, continue current meds  3. Thyroid nodules - has not heard back regarding possible biospy based on US  findings. I messaged Dr Marigene Ehlers to see if he would like [place] to set her up with endocrinology in [facility].   Very close f/u with CHF clinic, we will see every 6 months for now.    Arnoldo Lenis, M.D

## 2021-04-17 ENCOUNTER — Telehealth (HOSPITAL_COMMUNITY): Payer: Self-pay | Admitting: *Deleted

## 2021-04-17 DIAGNOSIS — E041 Nontoxic single thyroid nodule: Secondary | ICD-10-CM

## 2021-04-17 NOTE — Telephone Encounter (Signed)
-----   Message from [name], MD sent at 04/05/2021  4:29 PM EDT ----- Sizable thyroid nodules, biopsy recommended.  Please refer to interventional radiology to biopsy thyroid nodules to rule out malignancy.

## 2021-04-17 NOTE — Telephone Encounter (Signed)
Pt aware and agreeable to proceed with biopsy, will arrange   Biopsy sch for Wed 6/1 at 11 am, pt to arrive at 10:30 to [facility] radiology  Pt is aware and agreeable

## 2021-04-24 ENCOUNTER — Telehealth: Payer: Self-pay

## 2021-04-24 ENCOUNTER — Other Ambulatory Visit (HOSPITAL_COMMUNITY): Payer: Self-pay | Admitting: Cardiology

## 2021-04-24 DIAGNOSIS — E041 Nontoxic single thyroid nodule: Secondary | ICD-10-CM

## 2021-04-24 NOTE — Telephone Encounter (Signed)
Pt notified and voiced understanding. Pt had no questions or comments at this time.

## 2021-04-24 NOTE — Telephone Encounter (Signed)
-----   Message from Arnoldo Lenis, MD sent at 04/24/2021 12:36 PM EDT ----- Labs look good, Dr Marigene Ehlers may consider some medication changes at there upcoming f/u since labs look good   Zandra Abts MD

## 2021-04-25 ENCOUNTER — Encounter (HOSPITAL_COMMUNITY): Payer: Self-pay

## 2021-04-25 ENCOUNTER — Ambulatory Visit (HOSPITAL_COMMUNITY)
Admission: RE | Admit: 2021-04-25 | Discharge: 2021-04-25 | Disposition: A | Discharge status: Home / Self Care | Payer: Medicare Other | Source: Ambulatory Visit | Attending: Cardiology | Admitting: Cardiology

## 2021-04-25 DIAGNOSIS — E042 Nontoxic multinodular goiter: Secondary | ICD-10-CM | POA: Diagnosis not present

## 2021-04-25 DIAGNOSIS — E041 Nontoxic single thyroid nodule: Secondary | ICD-10-CM | POA: Diagnosis not present

## 2021-04-25 MED ORDER — LIDOCAINE HCL (PF) 2 % IJ SOLN
10.0000 mL | Freq: Once | INTRAMUSCULAR | Status: AC
  Administered 2021-04-25: 10 mL

## 2021-04-25 NOTE — Sedation Documentation (Signed)
PT tolerated thyroid biopsy procedure well today. Labs obtained and sent for pathology. PT ambulatory at discharge with no acute distress noted and verbalized understanding of discharge instructions.

## 2021-04-25 NOTE — Discharge Instructions (Signed)
Thyroid Needle Biopsy, Care After This sheet gives you information about how to care for yourself after your procedure. Your health care provider may also give you more specific instructions. If you have problems or questions, contact your health care provider. What can I expect after the procedure? After the procedure, it is common to have:  Soreness and tenderness that lasts for a few days.  Bruising where the needle was inserted (puncture site). Follow these instructions at home:  Take over-the-counter and prescription medicines only as told by your health care provider.  To help ease discomfort, keep your head raised (elevated) when you are lying down. When you move from lying down to sitting up, use both hands to support the back of your head and neck.  Check your puncture site every day for signs of infection. Check for: ? Redness, swelling, or pain. ? Fluid or blood. ? Warmth. ? Pus or a bad smell.  Return to your normal activities as told by your health care provider. Ask your health care provider what activities are safe for you.  Keep all follow-up visits as told by your health care provider. This is important.   Contact a health care provider if:  You have redness, swelling, or pain around your puncture site.  You have fluid or blood coming from your puncture site.  Your puncture site feels warm to the touch.  You have pus or a bad smell coming from your puncture site.  You have a fever. Get help right away if:  You have severe bleeding from the puncture site.  You have difficulty swallowing.  You have swollen glands (lymph nodes) in your neck. Summary  It is common to have some bruising and soreness where the needle was inserted in your lower front neck area (puncture site).  Check your puncture site every day for signs of infection, such as redness, swelling, or pain.  Get help right away if you have severe bleeding from your puncture site. This  information is not intended to replace advice given to you by your health care provider. Make sure you discuss any questions you have with your health care provider. Document Revised: 07/20/2020 Document Reviewed: 07/20/2020 Elsevier Patient Education  2021 Elsevier Inc.  

## 2021-04-26 LAB — CYTOLOGY - NON PAP

## 2021-04-27 LAB — CYTOLOGY - NON PAP

## 2021-05-01 ENCOUNTER — Encounter (HOSPITAL_COMMUNITY): Payer: Self-pay | Admitting: Cardiology

## 2021-05-01 ENCOUNTER — Ambulatory Visit (HOSPITAL_COMMUNITY)
Admission: RE | Admit: 2021-05-01 | Discharge: 2021-05-01 | Disposition: A | Discharge status: Home / Self Care | Payer: Medicare Other | Source: Ambulatory Visit | Attending: Cardiology | Admitting: Cardiology

## 2021-05-01 ENCOUNTER — Other Ambulatory Visit: Payer: Self-pay

## 2021-05-01 VITALS — BP 162/68 | HR 51 | Wt 165.8 lb

## 2021-05-01 DIAGNOSIS — I471 Supraventricular tachycardia: Secondary | ICD-10-CM | POA: Insufficient documentation

## 2021-05-01 DIAGNOSIS — I5022 Chronic systolic (congestive) heart failure: Secondary | ICD-10-CM | POA: Diagnosis not present

## 2021-05-01 DIAGNOSIS — N183 Chronic kidney disease, stage 3 unspecified: Secondary | ICD-10-CM | POA: Diagnosis not present

## 2021-05-01 DIAGNOSIS — I5042 Chronic combined systolic (congestive) and diastolic (congestive) heart failure: Secondary | ICD-10-CM | POA: Diagnosis not present

## 2021-05-01 DIAGNOSIS — I130 Hypertensive heart and chronic kidney disease with heart failure and stage 1 through stage 4 chronic kidney disease, or unspecified chronic kidney disease: Secondary | ICD-10-CM | POA: Insufficient documentation

## 2021-05-01 DIAGNOSIS — I428 Other cardiomyopathies: Secondary | ICD-10-CM | POA: Diagnosis not present

## 2021-05-01 DIAGNOSIS — Z79899 Other long term (current) drug therapy: Secondary | ICD-10-CM | POA: Diagnosis not present

## 2021-05-01 DIAGNOSIS — Z7984 Long term (current) use of oral hypoglycemic drugs: Secondary | ICD-10-CM | POA: Insufficient documentation

## 2021-05-01 LAB — COMPREHENSIVE METABOLIC PANEL
ALT: 17 U/L (ref 0–44)
AST: 19 U/L (ref 15–41)
Albumin: 3.9 g/dL (ref 3.5–5.0)
Alkaline Phosphatase: 84 U/L (ref 38–126)
Anion gap: 5 (ref 5–15)
BUN: 24 mg/dL — ABNORMAL HIGH (ref 6–20)
CO2: 27 mmol/L (ref 22–32)
Calcium: 9.3 mg/dL (ref 8.9–10.3)
Chloride: 105 mmol/L (ref 98–111)
Creatinine, Ser: 1.03 mg/dL — ABNORMAL HIGH (ref 0.44–1.00)
GFR, Estimated: >60 mL/min (ref >60)
Glucose, Bld: 100 mg/dL — ABNORMAL HIGH (ref 70–99)
Potassium: 4.8 mmol/L (ref 3.5–5.1)
Sodium: 137 mmol/L (ref 135–145)
Total Bilirubin: 0.4 mg/dL (ref 0.3–1.2)
Total Protein: 7.4 g/dL (ref 6.5–8.1)

## 2021-05-01 MED ORDER — AMIODARONE HCL 200 MG PO TABS: 100.0000 mg | ORAL_TABLET | Freq: Every day | ORAL | 3 refills | Status: DC

## 2021-05-01 MED ORDER — ENTRESTO 97-103 MG PO TABS: 1.0000 | ORAL_TABLET | Freq: Two times a day (BID) | ORAL | 11 refills | Status: DC

## 2021-05-01 NOTE — Patient Instructions (Signed)
Labs done today. We will contact you only if your labs are abnormal.  INCREASE Entresto to 97-103mg  (1 tablet) by mouth 2 times daily.   DECREASE Amiodarone to 100mg  (1/2 tablet) by mouth daily.   No other medication changes were made. Please continue all current medications as prescribed.  Your physician recommends that you schedule a follow-up appointment in: 10 days for a lab only appointment, 3 weeks for an appointment with our Clinic Pharmacist and in 3 months with Dr. [name] with an echo prior to your exam.   Your physician has requested that you have an echocardiogram. Echocardiography is a painless test that uses sound waves to create images of your heart. It provides your doctor with information about the size and shape of your heart and how well your heart's chambers and valves are working. This procedure takes approximately one hour. There are no restrictions for this procedure.  If you have any questions or concerns before your next appointment please send [place] a message through [place] or call our office at [phone].    TO LEAVE A MESSAGE FOR THE NURSE SELECT OPTION 2, PLEASE LEAVE A MESSAGE INCLUDING: . YOUR NAME . DATE OF BIRTH . CALL BACK NUMBER . REASON FOR CALL**this is important as we prioritize the call backs  YOU WILL RECEIVE A CALL BACK THE SAME DAY AS LONG AS YOU CALL BEFORE 4:00 PM   Do the following things EVERYDAY: 1) Weigh yourself in the morning before breakfast. Write it down and keep it in a log. 2) Take your medicines as prescribed 3) Eat low salt foods--Limit salt (sodium) to 2000 mg per day.  4) Stay as active as you can everyday 5) Limit all fluids for the day to less than 2 liters   At the [facility], you and your health needs are our priority. As part of our continuing mission to provide you with exceptional heart care, we have created designated Provider Care Teams. These Care Teams include your primary Cardiologist (physician)  and Advanced Practice Providers (APPs- Physician Assistants and Nurse Practitioners) who all work together to provide you with the care you need, when you need it.   You may see any of the following providers on your designated Care Team at your next follow up: Marland Kitchen Dr Glori Bickers . Dr Loralie Champagne . Darrick Grinder, NP . Lyda Jester, PA . Audry Riles, PharmD   Please be sure to bring in all your medications bottles to every appointment.

## 2021-05-01 NOTE — Progress Notes (Signed)
PCP: Rosita Fire, MD Cardiology: Dr [name]  60 y.o. with history of HTN, SVT, and mild nonischemic cardiomopathy.  In 5/21, echo showed EF 45% with severe anteroseptal hypokinesis.  LHC in 5/21 showed normal coronaries.  She has LBBB at baseline.  She has been noted to have SVT in the past.    On 02/11/21, she went to the ER at [facility] with abdominal pain and distention.  She was noted to be in atrial tachycardia with rate around 150.  She was hypotensive and got IVF, was also transiently on norepinephrine.  HR dropped transiently to the 40s with junctional bradycardia per report. AKI with creatinine up to 2.21.  Echo in 3/22 showed EF 25-30%, moderate RV enlargement and moderately decreased RV systolic function with D-shaped septum, biatrial enlargement.  Concern for tachy-brady syndrome, she was transferred to [facility] for further evaluation.  No bradycardia noted at [facility], and she remained in NSR on amiodarone.  EP thought that ablation of the atrial tachycardia would be difficult and recommended ongoing management with amiodarone.  LHC/RHC after diuresis showed normal filling pressures, preserved cardiac output, and no significant CAD.  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   She returns for followup of CHF.  She has been feeling good. Weight up 3 lbs.  No palpitations, HR in 50s today and regular.  No chest pain.  No significant exertional dyspnea with usual ADLs.  No orthopnea/PND.  No lightheadedness.  Taking all her meds.    Labs (3/22): K 4.6, creatinine 1.7 Labs (4/22): ACE level normal, LFTs normal Labs (5/22): K 4.5, creatinine 0.85  Past Medical History: 1. Atrial tachycardia 2. HTN 3. Nonischemic cardiomyopathy: Echo in 5/21 with EF 45%, severe anteroseptal HK.  - LHC (5/21): normal coronaries - Echo (3/22): EF 25-30%, moderate RV enlargement and moderately decreased RV systolic function with D-shaped septum, biatrial enlargement.  -  RHC/LHC (3/22): No significant coronary disease; mean RA 1, PA 35/7, mean PCWP 4, CI 3.5 F, 2.8 T. - Cardiac MRI (3/22): Normal LV size with EF 20%, diffuse hypokinesis with septal-lateral dyssynchrony c/w LBBB. Normal RV size with EF 26%.  Difficult delayed enhancement imaging, however there does appear to be LGE in the septum that could be in a coronary disease-type pattern (<50% wall thickness subendocardial LGE in the mid-apical septal wall). - High resolution CT chest (4/22) with no evidence for pulmonary sarcoidosis or ILD.  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  4. Thyroid nodules: Benign on 2022 biopsy.   FH: Mother and niece with CHF.   Social History   Socioeconomic History  . Marital status: Single    Spouse name: Not on file  . Number of children: Not on file  . Years of education: Not on file  . Highest education level: Not on file  Occupational History  . Not on file  Tobacco Use  . Smoking status: Never Smoker  . Smokeless tobacco: Never Used  Vaping Use  . Vaping Use: Never used  Substance and Sexual Activity  . Alcohol use: No    Alcohol/week: 0.0 standard drinks  . Drug use: No  . Sexual activity: Not Currently    Birth control/protection: Post-menopausal  Other Topics Concern  . Not on file  Social History Narrative  . Not on file   Social Determinants of Health   Financial Resource Strain: Low Risk   . Difficulty of Paying Living Expenses: Not hard  at all  Food Insecurity: No Food Insecurity  . Worried About Charity fundraiser in the Last Year: Never true  . Ran Out of Food in the Last Year: Never true  Transportation Needs: No Transportation Needs  . Lack of Transportation (Medical): No  . Lack of Transportation (Non-Medical): No  Physical Activity: Sufficiently Active  . Days of Exercise per Week: 7 days  . Minutes of Exercise per Session: 30 min  Stress: No Stress Concern Present  . Feeling of Stress  : Not at all  Social Connections: Unknown  . Frequency of Communication with Friends and Family: More than three times a week  . Frequency of Social Gatherings with Friends and Family: Once a week  . Attends Religious Services: 1 to 4 times per year  . Active Member of Clubs or Organizations: No  . Attends Archivist Meetings: Never  . Marital Status: Patient refused  Intimate Partner Violence: Not At Risk  . Fear of Current or Ex-Partner: No  . Emotionally Abused: No  . Physically Abused: No  . Sexually Abused: No   ROS: All systems reviewed and negative except as per HPI.   Current Outpatient Medications  Medication Sig Dispense Refill  . albuterol (VENTOLIN HFA) 108 (90 Base) MCG/ACT inhaler Inhale 1-2 puffs into the lungs every 4 (four) hours as needed.    Marland Kitchen atorvastatin (LIPITOR) 80 MG tablet Take 1 tablet (80 mg total) by mouth daily. 90 tablet 3  . dapagliflozin propanediol (FARXIGA) 10 MG TABS tablet TAKE 1 TABLET (10 MG TOTAL) BY MOUTH DAILY. 30 tablet 6  . isosorbide-hydrALAZINE (BIDIL) 20-37.5 MG tablet Take 2 tablets by mouth 3 (three) times daily. 180 tablet 5  . sacubitril-valsartan (ENTRESTO) 97-103 MG Take 1 tablet by mouth 2 (two) times daily. 60 tablet 11  . spironolactone (ALDACTONE) 25 MG tablet TAKE 1 TABLET (25 MG TOTAL) BY MOUTH DAILY. 30 tablet 6  . amiodarone (PACERONE) 200 MG tablet Take 0.5 tablets (100 mg total) by mouth daily. 45 tablet 3   No current facility-administered medications for this encounter.   BP (!) 162/68   Pulse (!) 51   Wt 75.2 kg (165 lb 12.8 oz)   LMP 05/23/2007   SpO2 99%   BMI 27.17 kg/m  General: NAD Neck: No JVD, no thyromegaly or thyroid nodule.  Lungs: Clear to auscultation bilaterally with normal respiratory effort. CV: Nondisplaced PMI.  Heart regular S1/S2, no S3/S4, no murmur.  No peripheral edema.  No carotid bruit.  Normal pedal pulses.  Abdomen: Soft, nontender, no hepatosplenomegaly, no distention.  Skin:  Intact without lesions or rashes.  Neurologic: Alert and oriented x 3.  Psych: Normal affect. Extremities: No clubbing or cyanosis.  HEENT: Normal.   Assessment/Plan: 1. Chronic systolic CHF: Nonischemic cardiomyopathy based on 5/21 cath, echo at that time showed EF 45%. She was admitted 3/22 with volume overload and atrial tachycardia with HR in 150s. Back in NSR with amiodarone.Echo in 3/22 with EF 25-30%, moderate RV enlargement and moderately decreased RV systolic function with D-shaped septum, biatrial enlargement. CMRI with LV EF 20% RV EF 26%, technically difficult but there was delayed enhancement suggestive of possible coronary disease-type pattern, however LHC repeated in 3/22 showed no CAD. RHC showed low filling pressures and preserved CO.  Cause of cardiomyopathy uncertain, cannot rule out cardiac sarcoidosis as cause of MRI pattern though high resolution CT chest did not show evidence for pulmonary sarcoidosis.  Tachy-mediated component is certainly possible as well (  from atrial tachycardia).  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   NYHA class II, she is not volume overloaded on exam.  - She has not needed Lasix.  - Continue Farxiga 10 mg daily.   - Continue Bidil 2 tab tid.  - Continue spiro 25 mg daily.  - Increase Entresto to 97/103 bid.  BMET today and in 10 days.  - Repeat echo in 3 months after medication titration.  Last ECG showed IVCD 134 msec, not clear that she would benefit from CRT.  Would favor ICD if EF remains low given young age and LGE on cMRI.  - If EF does not improve with maintenance of NSR, would consider cardiac PET for ongoing exploration of ?cardiac sarcoidosis.  2. Atrial tachycardia with RVR: 3/22 admission.  May have contributed to cardiomyopathy.  HR in 50s and regular today.    - Can decrease amiodarone to 100 mg daily, check LFTs and TSH and will need regular eye exam.   - Seen by EP in hospital, did not recommend ablation.  3. CKD: Stage 3.  BMET today.  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  Not clear that CRT would be helpful with QRS < 150 and not true LBBB.   Followup in 3 wks in pharmacy clinic for med titration x 2 appts.  Followup with me in 3 months with echo.    Michelle Morrison 05/01/2021

## 2021-05-02 ENCOUNTER — Other Ambulatory Visit (HOSPITAL_COMMUNITY): Payer: Self-pay | Admitting: Cardiology

## 2021-05-05 DIAGNOSIS — E1165 Type 2 diabetes mellitus with hyperglycemia: Secondary | ICD-10-CM | POA: Diagnosis not present

## 2021-05-05 DIAGNOSIS — I5042 Chronic combined systolic (congestive) and diastolic (congestive) heart failure: Secondary | ICD-10-CM | POA: Diagnosis not present

## 2021-06-04 DIAGNOSIS — E1165 Type 2 diabetes mellitus with hyperglycemia: Secondary | ICD-10-CM | POA: Diagnosis not present

## 2021-06-04 DIAGNOSIS — I5042 Chronic combined systolic (congestive) and diastolic (congestive) heart failure: Secondary | ICD-10-CM | POA: Diagnosis not present

## 2021-06-07 ENCOUNTER — Other Ambulatory Visit: Payer: Self-pay

## 2021-06-07 ENCOUNTER — Other Ambulatory Visit (HOSPITAL_COMMUNITY): Payer: Self-pay

## 2021-06-07 ENCOUNTER — Ambulatory Visit (HOSPITAL_COMMUNITY)
Admission: RE | Admit: 2021-06-07 | Discharge: 2021-06-07 | Disposition: A | Discharge status: Home / Self Care | Payer: Medicare Other | Source: Ambulatory Visit | Attending: Cardiology | Admitting: Cardiology

## 2021-06-07 ENCOUNTER — Encounter (HOSPITAL_COMMUNITY): Payer: Self-pay

## 2021-06-07 VITALS — BP 143/96 | HR 67 | Wt 168.8 lb

## 2021-06-07 DIAGNOSIS — N183 Chronic kidney disease, stage 3 unspecified: Secondary | ICD-10-CM | POA: Diagnosis not present

## 2021-06-07 DIAGNOSIS — I447 Left bundle-branch block, unspecified: Secondary | ICD-10-CM | POA: Diagnosis not present

## 2021-06-07 DIAGNOSIS — I428 Other cardiomyopathies: Secondary | ICD-10-CM | POA: Insufficient documentation

## 2021-06-07 DIAGNOSIS — Z79899 Other long term (current) drug therapy: Secondary | ICD-10-CM | POA: Insufficient documentation

## 2021-06-07 DIAGNOSIS — I5022 Chronic systolic (congestive) heart failure: Secondary | ICD-10-CM

## 2021-06-07 DIAGNOSIS — I130 Hypertensive heart and chronic kidney disease with heart failure and stage 1 through stage 4 chronic kidney disease, or unspecified chronic kidney disease: Secondary | ICD-10-CM | POA: Diagnosis not present

## 2021-06-07 LAB — BASIC METABOLIC PANEL
Anion gap: 9 (ref 5–15)
BUN: 27 mg/dL — ABNORMAL HIGH (ref 6–20)
CO2: 26 mmol/L (ref 22–32)
Calcium: 9 mg/dL (ref 8.9–10.3)
Chloride: 106 mmol/L (ref 98–111)
Creatinine, Ser: 1.16 mg/dL — ABNORMAL HIGH (ref 0.44–1.00)
GFR, Estimated: 54 mL/min — ABNORMAL LOW (ref >60)
Glucose, Bld: 117 mg/dL — ABNORMAL HIGH (ref 70–99)
Potassium: 5 mmol/L (ref 3.5–5.1)
Sodium: 141 mmol/L (ref 135–145)

## 2021-06-07 MED ORDER — ENTRESTO 97-103 MG PO TABS
1.0000 | ORAL_TABLET | Freq: Two times a day (BID) | ORAL | 11 refills | Status: DC
  Filled 2021-06-07: qty 60, 30d supply, fill #0

## 2021-06-07 NOTE — Patient Instructions (Signed)
It was a pleasure seeing you today!  MEDICATIONS: -We are changing your medications today -Increase your Entresto to 1 tablet (97/103 mg) TWICE daily -Call if you have questions about your medications.  LABS: -We will call you if your labs need attention.  NEXT APPOINTMENT: Return to clinic in 1 month with HF PharmD.  In general, to take care of your heart failure: -Limit your fluid intake to 2 Liters (half-gallon) per day.   -Limit your salt intake to ideally 2-3 grams (2000-3000 mg) per day. -Weigh yourself daily and record, and bring that "weight diary" to your next appointment.  (Weight gain of 2-3 pounds in 1 day typically means fluid weight.) -The medications for your heart are to help your heart and help you live longer.   -Please contact us before stopping any of your heart medications.  Call the clinic at [phone] with questions or to reschedule future appointments.

## 2021-06-07 NOTE — Progress Notes (Signed)
PCP: Rosita Fire, MD Cardiology: Dr [name]  HPI:   60 y.o. with history of HTN, SVT, and mild nonischemic cardiomopathy. In 5/21, echo showed EF 45% with severe anteroseptal hypokinesis.  LHC in 5/21 showed normal coronaries. She has LBBB at baseline. She has been noted to have SVT in the past.     On 02/11/21, she went to the ER at [facility] with abdominal pain and distention.  She was noted to be in atrial tachycardia with rate around 150.  She was hypotensive and got IVF, was also transiently on norepinephrine.  HR dropped transiently to the 40s with junctional bradycardia per report. AKI with creatinine up to 2.21.  Echo in 3/22 showed EF 25-30%, moderate RV enlargement and moderately decreased RV systolic function with D-shaped septum, biatrial enlargement.  Concern for tachy-brady syndrome, she was transferred to [facility] for further evaluation.  No bradycardia noted at [facility], and she remained in NSR on amiodarone. EP thought that ablation of the atrial tachycardia would be difficult and recommended ongoing management with amiodarone.  LHC/RHC after diuresis showed normal filling pressures, preserved cardiac output, and no significant CAD. 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  She presented to HF clinic for follow up on 5/5 with PharmD. Her BiDil was increased to 2 tabs TID. She returned to clinic on 6/7 and was seen by Dr. [name]. She reported she had been feeling good. Weight up 3 lbs.  No palpitations, HR in 50s and regular. BP 162/68. No chest pain. No significant exertional dyspnea with usual ADLs.  No orthopnea/PND.  No lightheadedness.  Taking all her meds.    Today she returns to HF clinic for pharmacist medication titration. At last visit with MD, Delene Loll was increased to 97-103. Overall, she is feeling good. Sometimes has to stop to rest when completing chores around the house. Patient reports no dizziness, lightheadedness, or fatigue. No  chest pain or palpitations. Denies any PND or orthopnea. States that breathing has been fine and she is able to complete ADLs. Her activity level consists of cleaning or walking her dog around her neighborhood for about 15-30 minutes daily. Appetite is good. Stable weight at home around the 161-162 lbs. She also takes her BP at home in the morning (prior to taking medications) with the systolic number around 161W/960A.   She has been taking her medications routinely but it appears she did not increase her Entresto to 97/103 mg BID as prescribed during the last visit. She brought the Entresto 24/26 mg bottle with her to clinic but was able to recall the 49/51 mg dose was being used to fill her pill box (she did not bring this to clinic with her). Contacted [facility] and the 97/103 mg tablet was picked up on 6/7 but the patient was clear that she had only been taking the 49/51 mg Entresto.  HF Medications: Entresto 49/51 mg BID Spironolactone 25 mg daily Farxiga 10 mg daily BiDil 20/37.5 mg 2 tabs TID  Has the patient been experiencing any side effects to the medications prescribed? No  Does the patient have any problems obtaining medications due to transportation or finances? No - has U[employer] Medicare + [place] Medicaid  Understanding of regimen: fair Understanding of indications: excellent Potential of compliance: good Patient understands to avoid NSAIDs. Patient understands to avoid decongestants.   Pertinent Lab Values: Serum creatinine 1.16, BUN 27, Potassium 5.0, Sodium 141  Vital Signs: Weight: 168 lbs (last clinic weight:  165 lbs) Blood pressure: 143/96 mmHg Heart rate: 67 bpm   Assessment/Plan: 1. Chronic systolic CHF:  Nonischemic cardiomyopathy based on 5/21 cath, echo at that time showed EF 45%.  She was admitted 3/22 with volume overload and atrial tachycardia with HR in 150s. Back in NSR with amiodarone. Echo in 3/22 with EF 25-30%, moderate RV enlargement and moderately decreased RV  systolic function with D-shaped septum, biatrial enlargement.  CMRI with LV EF 20% RV EF 26%, technically difficult but there was delayed enhancement suggestive of possible coronary disease-type pattern, however LHC repeated in 3/22 showed no CAD. RHC showed low filling pressures and preserved CO.  Cause of cardiomyopathy uncertain, cannot rule out cardiac sarcoidosis as cause of MRI pattern though high resolution CT chest did not show evidence for pulmonary sarcoidosis.  Tachy-mediated component is certainly possible as well (from atrial tachycardia).  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  NYHA class II, she is not volume overloaded on exam. - She has not needed Lasix.  - Increase Entresto to 97/103 mg BID as she was still taking previous dose of 49/51 mg BID. - Continue spironolactone 25 mg daily - Continue Farxiga 10 mg daily.   - Continue BiDil 2 tabs TID. - BMET today, K 5.0. Repeat BMET in 1 week after increasing Entresto. Patient states she is able to get this done at [facility] next Thursday. - Repeat echo in 2 months after medication titration.  Last ECG showed IVCD 134 msec, not clear that she would benefit from CRT.  Would favor ICD if EF remains low given young age and LGE on cMRI. - If EF does not improve with maintenance of NSR, would consider cardiac PET for ongoing exploration of ?cardiac sarcoidosis.  2. Atrial tachycardia with RVR: 3/22 admission.  May have contributed to cardiomyopathy.  - Amiodarone 100 mg daily - Seen by EP in hospital, did not recommend ablation.  3. CKD: Stage 3.  BMET today and repeat in 1 week. 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  Not clear that CRT would be helpful with QRS < 150 and not true LBBB.  Follow up with lab in 1 week, PharmD in 1 month, and with Dr. [name] + ECHO in September.  Dorian Furnace PharmD/MBA Candidate [facility], Class of  [place], PharmD, BCPS Advanced [facility] Failure Clinic Pharmacist [phone]

## 2021-06-08 ENCOUNTER — Other Ambulatory Visit (HOSPITAL_COMMUNITY): Payer: Self-pay

## 2021-06-26 ENCOUNTER — Emergency Department (HOSPITAL_COMMUNITY)
Admission: EM | Admit: 2021-06-26 | Discharge: 2021-06-26 | Disposition: A | Discharge status: Home / Self Care | Payer: Medicare Other | Attending: Emergency Medicine | Admitting: Emergency Medicine

## 2021-06-26 ENCOUNTER — Other Ambulatory Visit: Payer: Self-pay

## 2021-06-26 ENCOUNTER — Encounter (HOSPITAL_COMMUNITY): Payer: Self-pay | Admitting: *Deleted

## 2021-06-26 DIAGNOSIS — Z79899 Other long term (current) drug therapy: Secondary | ICD-10-CM | POA: Diagnosis not present

## 2021-06-26 DIAGNOSIS — U071 COVID-19: Secondary | ICD-10-CM | POA: Insufficient documentation

## 2021-06-26 DIAGNOSIS — Z20822 Contact with and (suspected) exposure to covid-19: Secondary | ICD-10-CM

## 2021-06-26 DIAGNOSIS — I110 Hypertensive heart disease with heart failure: Secondary | ICD-10-CM | POA: Diagnosis not present

## 2021-06-26 DIAGNOSIS — E119 Type 2 diabetes mellitus without complications: Secondary | ICD-10-CM | POA: Insufficient documentation

## 2021-06-26 DIAGNOSIS — I5043 Acute on chronic combined systolic (congestive) and diastolic (congestive) heart failure: Secondary | ICD-10-CM | POA: Diagnosis not present

## 2021-06-26 DIAGNOSIS — J00 Acute nasopharyngitis [common cold]: Secondary | ICD-10-CM | POA: Diagnosis present

## 2021-06-26 LAB — SARS CORONAVIRUS 2 (TAT 6-24 HRS): SARS Coronavirus 2: POSITIVE — AB

## 2021-06-26 NOTE — ED Provider Notes (Signed)
[facility] Provider Note   CSN: [id number] Arrival date & time: 06/26/21  1037     History No chief complaint on file.   Michelle Morrison is a 60 y.o. female.  Pt presents to the ED today for a Covid test.  Pt has been visiting with a friend who has had a cold.  The friend told her she was Covid +.  The pt wants to be checked for Covid today.  She has not had any sx of Covid and feels fine.      Past Medical History:  Diagnosis Date   CHF (congestive heart failure) (HCC)    a. EF 45% by echo in 03/2020 with normal cors by cath   Diabetes mellitus without complication ([place])    Diarrhea    Herpes simplex infection    Hyperlipidemia    Hypertension     Patient Active Problem List   Diagnosis Date Noted   Atrial tachycardia, paroxysmal ([place]) 02/12/2021   Paroxysmal A-fib/Flutter(HCC) 02/11/2021   Diabetes mellitus type 2 with complications ([place]) [id number]   Atrial fibrillation with RVR ([place]) 02/11/2021   Acute on chronic combined systolic and diastolic CHF (congestive heart failure) ([place]) 06/09/2020   Hypertensive urgency    Elevated troponin    Dyspnea due to congestive heart failure ([place]) 04/13/2020   Accelerated hypertension 04/13/2020   Hyperglycemia due to diabetes mellitus ([place]) 04/13/2020   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   Herpes simplex infection 11/21/2015   HTN (hypertension), benign 04/01/2013    Past Surgical History:  Procedure Laterality Date   CESAREAN SECTION     CHOLECYSTECTOMY     COLONOSCOPY N/A 08/26/2014   Procedure: COLONOSCOPY;  Surgeon: [name], MD;  Location: [facility];  Service: Endoscopy;  Laterality: N/A;  9:30 AM - moved to 8:30 - Ginger to notify pt   LEFT HEART CATH AND CORONARY ANGIOGRAPHY N/A 04/14/2020   Procedure: LEFT HEART CATH AND CORONARY ANGIOGRAPHY;  Surgeon: [name], [name], MD;  Location: [facility];  Service: Cardiovascular;  Laterality: N/A;   RIGHT/LEFT HEART CATH AND CORONARY  ANGIOGRAPHY N/A 02/19/2021   Procedure: RIGHT/LEFT HEART CATH AND CORONARY ANGIOGRAPHY;  Surgeon: [name], MD;  Location: [place] [facility];  Service: Cardiovascular;  Laterality: N/A;     OB History     Gravida  2   Para  2   Term  1   Preterm  1   AB      Living  2      SAB      IAB      Ectopic      Multiple      Live Births  2           Family History  Problem Relation Age of Onset   Hypertension Mother    Diabetes Mother    Colon cancer Neg Hx     Social History   Tobacco Use   Smoking status: Never   Smokeless tobacco: Never  Vaping Use   Vaping Use: Never used  Substance Use Topics   Alcohol use: No    Alcohol/week: 0.0 standard drinks   Drug use: No    Home Medications Prior to Admission medications   Medication Sig Start Date End Date Taking? Authorizing Provider  albuterol (VENTOLIN HFA) 108 (90 Base) MCG/ACT inhaler Inhale 1-2 puffs into the lungs every 4 (four) hours as needed. Patient not taking: Reported on 06/07/2021 05/30/20  [provider]  amiodarone (PACERONE) 200 MG tablet Take 0.5 tablets (100 mg total) by mouth daily. 05/01/21   [name], MD  atorvastatin (LIPITOR) 80 MG tablet Take 1 tablet (80 mg total) by mouth daily. 03/14/21   Arnoldo Lenis, MD  dapagliflozin propanediol (FARXIGA) 10 MG TABS tablet TAKE 1 TABLET (10 MG TOTAL) BY MOUTH DAILY. 03/14/21   [name], MD  isosorbide-hydrALAZINE (BIDIL) 20-37.5 MG tablet Take 2 tablets by mouth 3 (three) times daily. 03/29/21   [name], MD  sacubitril-valsartan (ENTRESTO) 97-103 MG Take 1 tablet by mouth 2 (two) times daily. 06/07/21   [name], MD  spironolactone (ALDACTONE) 25 MG tablet TAKE 1 TABLET (25 MG TOTAL) BY MOUTH DAILY. 03/14/21   [name], MD    Allergies    Patient has no known allergies.  Review of Systems   Review of Systems  All other systems reviewed and are negative.  Physical Exam Updated Vital  Signs BP (!) 170/74 (BP Location: Right Arm)   Pulse 86   Temp 98.4 F (36.9 C) (Oral)   Resp 20   LMP 05/23/2007   SpO2 97%   Physical Exam Vitals and nursing note reviewed.  Constitutional:      Appearance: Normal appearance. She is obese.  HENT:     Head: Normocephalic and atraumatic.     Right Ear: External ear normal.     Left Ear: External ear normal.     Nose: Nose normal.     Mouth/Throat:     Mouth: Mucous membranes are moist.     Pharynx: Oropharynx is clear.  Eyes:     Extraocular Movements: Extraocular movements intact.     Conjunctiva/sclera: Conjunctivae normal.     Pupils: Pupils are equal, round, and reactive to light.  Cardiovascular:     Rate and Rhythm: Normal rate and regular rhythm.     Pulses: Normal pulses.     Heart sounds: Normal heart sounds.  Pulmonary:     Effort: Pulmonary effort is normal.     Breath sounds: Normal breath sounds.  Abdominal:     General: Abdomen is flat. Bowel sounds are normal.  Musculoskeletal:        General: Normal range of motion.     Cervical back: Normal range of motion and neck supple.  Skin:    General: Skin is warm.     Capillary Refill: Capillary refill takes less than 2 seconds.  Neurological:     General: No focal deficit present.     Mental Status: She is alert and oriented to person, place, and time.  Psychiatric:        Mood and Affect: Mood normal.        Behavior: Behavior normal.    ED Results / Procedures / Treatments   Labs (all labs ordered are listed, but only abnormal results are displayed) Labs Reviewed  SARS CORONAVIRUS 2 (TAT 6-24 HRS)    EKG None  Radiology No results found.  Procedures Procedures   Medications Ordered in ED Medications - No data to display  ED Course  I have reviewed the triage vital signs and the nursing notes.  Pertinent labs & imaging results that were available during my care of the patient were reviewed by me and considered in my medical decision  making (see chart for details).    MDM Rules/Calculators/A&P  Covid swab ordered.  Pt is asymptomatic, so no need for CXR or additional testing.    Michelle Morrison was evaluated in Emergency Department on 06/26/2021 for the symptoms described in the history of present illness. She was evaluated in the context of the global COVID-19 pandemic, which necessitated consideration that the patient might be at risk for infection with the SARS-CoV-2 virus that causes COVID-19. Institutional protocols and algorithms that pertain to the evaluation of patients at risk for COVID-19 are in a state of rapid change based on information released by regulatory bodies including the CDC and federal and state organizations. These policies and algorithms were followed during the patient's care in the ED.  Final Clinical Impression(s) / ED Diagnoses Final diagnoses:  Exposure to confirmed case of COVID-19    Rx / DC Orders ED Discharge Orders     None        Isla Pence, MD 06/26/21 1110

## 2021-06-26 NOTE — Discharge Instructions (Addendum)
Person Under Monitoring Name: Michelle Morrison  Location: [address] [place] [address]   Infection Prevention Recommendations for Individuals Confirmed to have, or Being Evaluated for, 2019 Novel Coronavirus (COVID-19) Infection Who Receive Care at Home  Individuals who are confirmed to have, or are being evaluated for, COVID-19 should follow the prevention steps below until a healthcare provider or local or state health department says they can return to normal activities.  Stay home except to get medical care You should restrict activities outside your home, except for getting medical care. Do not go to work, school, or public areas, and do not use public transportation or taxis.  Call ahead before visiting your doctor Before your medical appointment, call the healthcare provider and tell them that you have, or are being evaluated for, COVID-19 infection. This will help the healthcare provider's office take steps to keep other people from getting infected. Ask your healthcare provider to call the local or state health department.  Monitor your symptoms Seek prompt medical attention if your illness is worsening (e.g., difficulty breathing). Before going to your medical appointment, call the healthcare provider and tell them that you have, or are being evaluated for, COVID-19 infection. Ask your healthcare provider to call the local or state health department.  Wear a facemask You should wear a facemask that covers your nose and mouth when you are in the same room with other people and when you visit a healthcare provider. People who live with or visit you should also wear a facemask while they are in the same room with you.  Separate yourself from other people in your home As much as possible, you should stay in a different room from other people in your home. Also, you should use a separate bathroom, if available.  Avoid sharing household items You should  not share dishes, drinking glasses, cups, eating utensils, towels, bedding, or other items with other people in your home. After using these items, you should wash them thoroughly with soap and water.  Cover your coughs and sneezes Cover your mouth and nose with a tissue when you cough or sneeze, or you can cough or sneeze into your sleeve. Throw used tissues in a lined trash can, and immediately wash your hands with soap and water for at least 20 seconds or use an alcohol-based hand rub.  Wash your [facility] your hands often and thoroughly with soap and water for at least 20 seconds. You can use an alcohol-based hand sanitizer if soap and water are not available and if your hands are not visibly dirty. Avoid touching your eyes, nose, and mouth with unwashed hands.   Prevention Steps for Caregivers and Household Members of Individuals Confirmed to have, or Being Evaluated for, COVID-19 Infection Being Cared for in the Home  If you live with, or provide care at home for, a person confirmed to have, or being evaluated for, COVID-19 infection please follow these guidelines to prevent infection:  Follow healthcare provider's instructions Make sure that you understand and can help the patient follow any healthcare provider instructions for all care.  Provide for the patient's basic needs You should help the patient with basic needs in the home and provide support for getting groceries, prescriptions, and other personal needs.  Monitor the patient's symptoms If they are getting sicker, call his or her medical provider and tell them that the patient has, or is being evaluated for, COVID-19 infection. This will help the healthcare  provider's office take steps to keep other people from getting infected. Ask the healthcare provider to call the local or state health department.  Limit the number of people who have contact with the patient If possible, have only one caregiver for the  patient. Other household members should stay in another home or place of residence. If this is not possible, they should stay in another room, or be separated from the patient as much as possible. Use a separate bathroom, if available. Restrict visitors who do not have an essential need to be in the home.  Keep older adults, very young children, and other sick people away from the patient Keep older adults, very young children, and those who have compromised immune systems or chronic health conditions away from the patient. This includes people with chronic heart, lung, or kidney conditions, diabetes, and cancer.  Ensure good ventilation Make sure that shared spaces in the home have good air flow, such as from an air conditioner or an opened window, weather permitting.  Wash your hands often Wash your hands often and thoroughly with soap and water for at least 20 seconds. You can use an alcohol based hand sanitizer if soap and water are not available and if your hands are not visibly dirty. Avoid touching your eyes, nose, and mouth with unwashed hands. Use disposable paper towels to dry your hands. If not available, use dedicated cloth towels and replace them when they become wet.  Wear a facemask and gloves Wear a disposable facemask at all times in the room and gloves when you touch or have contact with the patient's blood, body fluids, and/or secretions or excretions, such as sweat, saliva, sputum, nasal mucus, vomit, urine, or feces.  Ensure the mask fits over your nose and mouth tightly, and do not touch it during use. Throw out disposable facemasks and gloves after using them. Do not reuse. Wash your hands immediately after removing your facemask and gloves. If your personal clothing becomes contaminated, carefully remove clothing and launder. Wash your hands after handling contaminated clothing. Place all used disposable facemasks, gloves, and other waste in a lined container before  disposing them with other household waste. Remove gloves and wash your hands immediately after handling these items.  Do not share dishes, glasses, or other household items with the patient Avoid sharing household items. You should not share dishes, drinking glasses, cups, eating utensils, towels, bedding, or other items with a patient who is confirmed to have, or being evaluated for, COVID-19 infection. After the person uses these items, you should wash them thoroughly with soap and water.  Wash laundry thoroughly Immediately remove and wash clothes or bedding that have blood, body fluids, and/or secretions or excretions, such as sweat, saliva, sputum, nasal mucus, vomit, urine, or feces, on them. Wear gloves when handling laundry from the patient. Read and follow directions on labels of laundry or clothing items and detergent. In general, wash and dry with the warmest temperatures recommended on the label.  Clean all areas the individual has used often Clean all touchable surfaces, such as counters, tabletops, doorknobs, bathroom fixtures, toilets, phones, keyboards, tablets, and bedside tables, every day. Also, clean any surfaces that may have blood, body fluids, and/or secretions or excretions on them. Wear gloves when cleaning surfaces the patient has come in contact with. Use a diluted bleach solution (e.g., dilute bleach with 1 part bleach and 10 parts water) or a household disinfectant with a label that says EPA-registered for coronaviruses. To make  a bleach solution at home, add 1 tablespoon of bleach to 1 quart (4 cups) of water. For a larger supply, add  cup of bleach to 1 gallon (16 cups) of water. Read labels of cleaning products and follow recommendations provided on product labels. Labels contain instructions for safe and effective use of the cleaning product including precautions you should take when applying the product, such as wearing gloves or eye protection and making sure you  have good ventilation during use of the product. Remove gloves and wash hands immediately after cleaning.  Monitor yourself for signs and symptoms of illness Caregivers and household members are considered close contacts, should monitor their health, and will be asked to limit movement outside of the home to the extent possible. Follow the monitoring steps for close contacts listed on the symptom monitoring form.   ? If you have additional questions, contact your local health department or call the epidemiologist on call at [phone] (available 24/7). ? This guidance is subject to change. For the most up-to-date guidance from [facility], please refer to their website: [email or web address]

## 2021-06-26 NOTE — ED Triage Notes (Signed)
Requesting covid test, no symptoms

## 2021-06-29 NOTE — Progress Notes (Addendum)
PCP: Rosita Fire, MD Cardiology: Dr [name]  HPI:  60 y.o. with history of HTN, SVT, and mild nonischemic cardiomopathy. In 03/2020, echo showed EF 45% with severe anteroseptal hypokinesis.  LHC in 03/2020 showed normal coronaries. She has LBBB at baseline. She has been noted to have SVT in the past.     On 02/11/21, she went to the ER at [facility] with abdominal pain and distention.  She was noted to be in atrial tachycardia with rate around 150.  She was hypotensive and got IVF, was also transiently on norepinephrine.  HR dropped transiently to the 40s with junctional bradycardia per report. AKI with creatinine up to 2.21.  Echo in 01/2021 showed EF 25-30%, moderate RV enlargement and moderately decreased RV systolic function with D-shaped septum, biatrial enlargement.  Concern for tachy-brady syndrome, she was transferred to [facility] for further evaluation.  No bradycardia noted at [facility], and she remained in NSR on amiodarone. EP thought that ablation of the atrial tachycardia would be difficult and recommended ongoing management with amiodarone.  LHC/RHC after diuresis showed normal filling pressures, preserved cardiac output, and no significant CAD. 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  She presented to HF clinic for follow up on 03/29/21 with PharmD. Her BiDil was increased to 2 tabs TID. She returned to clinic on 05/01/21 and was seen by Dr. [name]. She reported she had been feeling good. Weight was up 3 lbs.  No palpitations, HR in 50s and regular. BP 162/68. No chest pain. No significant exertional dyspnea with usual ADLs.  No orthopnea/PND.  No lightheadedness.  Taking all her meds.  Entresto was increased to 97-103.   Recently returned to HF clinic for pharmacist medication titration on 06/07/21. Overall, she was feeling good. Sometimes had to stop to rest when completing chores around the house. Patient reported no dizziness, lightheadedness, or fatigue. No  chest pain or palpitations. Denied any PND or orthopnea. Stated that breathing had been fine and she was able to complete ADLs. Her activity level consisted of cleaning or walking her dog around her neighborhood for about 15-30 minutes daily. Appetite was good. Stable weight at home around 161-162 lbs. She also reported taking her BP at home in the morning (prior to taking medications) with the systolic number around [id number]. She had been taking her medications routinely but it appears she did not increase her Entresto to 97/103 mg BID as prescribed during the last visit. She brought the Entresto 24/26 mg bottle with her to clinic but was able to recall the 49/51 mg dose was being used to fill her pill box (she did not bring this to clinic with her). Contacted [facility] and the 97/103 mg tablet was picked up on 05/01/21 but the patient was clear that she had only been taking the 49/51 mg Entresto.  Today she returns to HF clinic for pharmacist medication titration. At last visit with pharmacy clinic, Entresto was increased to 97/103 mg BID. Overall she is feeling well today. No dizziness, lightheadedness, chest pain or palpitations. SOB going upstairs. No SOB/DOE on flat ground. Weight at home has been stable. Does not take any furosemide. No LEE, PND or orthopnea. Taking all medications as prescribed and tolerating all medications. Notes she has been following low K diet as discussed in a previous visit. BP elevated in clinic 150/72, SBP at home 140-150.  HF Medications: Entresto 97/103 mg BID Spironolactone 25 mg daily Farxiga 10 mg daily BiDil  20/37.5 mg 2 tabs TID  Has the patient been experiencing any side effects to the medications prescribed? No  Does the patient have any problems obtaining medications due to transportation or finances? No - has [employer] Medicare + [place] Medicaid  Understanding of regimen: fair Understanding of indications: excellent Potential of compliance: good Patient understands  to avoid NSAIDs. Patient understands to avoid decongestants.   Pertinent Lab Values: 06/07/21: Serum creatinine 1.16, BUN 27, Potassium 5.0, Sodium 141 07/12/21: BMET pending  Vital Signs: Weight: 165.8 lbs (last clinic weight: 168.8 lbs) Blood pressure: 150/72 mmHg Heart rate: 50 bpm   Assessment/Plan: 1. Chronic systolic CHF:  Nonischemic cardiomyopathy based on 03/2020 cath, echo at that time showed EF 45%.  She was admitted 01/2021 with volume overload and atrial tachycardia with HR in 150s. Back in NSR with amiodarone. Echo in 01/2021 with EF 25-30%, moderate RV enlargement and moderately decreased RV systolic function with D-shaped septum, biatrial enlargement.  CMRI with LV EF 20% RV EF 26%, technically difficult but there was delayed enhancement suggestive of possible coronary disease-type pattern, however LHC repeated in 01/2021 showed no CAD. RHC showed low filling pressures and preserved CO.  Cause of cardiomyopathy uncertain, cannot rule out cardiac sarcoidosis as cause of MRI pattern though high resolution CT chest did not show evidence for pulmonary sarcoidosis.  Tachy-mediated component is certainly possible as well (from atrial tachycardia).  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   - NYHA class II, she is not volume overloaded on exam. - Repeat BMET today. - She has not needed furosemide.  - Continue Entresto 97/103 mg BID  - Continue spironolactone 25 mg daily - Continue Farxiga 10 mg daily.   - Continue BiDil 2 tabs TID. - Repeat echo 07/2021 after medication titration.  Last ECG showed IVCD 134 msec, not clear that she would benefit from CRT.  Would favor ICD if EF remains low given young age and LGE on cMRI. - If EF does not improve with maintenance of NSR, would consider cardiac PET for ongoing exploration of ?cardiac sarcoidosis.  2. Atrial tachycardia with RVR: 01/2021 admission.  May have  contributed to cardiomyopathy.  - Amiodarone 100 mg daily - Seen by EP in hospital, did not recommend ablation.  3. CKD: Stage 3.   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  Not clear that CRT would be helpful with QRS < 150 and not true LBBB. 5. Hypertension -BP elevated today, 150/72 in clinic. Home SBP 140-150.  -Start amlodipine 5 mg daily.  -Continue Entresto and spironolactone.   Follow up in 4 weeks with Dr. Lita Mains, PharmD, BCPS, [facility], [facility] Pharmacist [phone]

## 2021-07-05 DIAGNOSIS — I5042 Chronic combined systolic (congestive) and diastolic (congestive) heart failure: Secondary | ICD-10-CM | POA: Diagnosis not present

## 2021-07-05 DIAGNOSIS — E1165 Type 2 diabetes mellitus with hyperglycemia: Secondary | ICD-10-CM | POA: Diagnosis not present

## 2021-07-12 ENCOUNTER — Ambulatory Visit (HOSPITAL_COMMUNITY)
Admission: RE | Admit: 2021-07-12 | Discharge: 2021-07-12 | Disposition: A | Discharge status: Home / Self Care | Payer: Medicare Other | Source: Ambulatory Visit | Attending: Internal Medicine | Admitting: Internal Medicine

## 2021-07-12 ENCOUNTER — Other Ambulatory Visit: Payer: Self-pay

## 2021-07-12 VITALS — BP 150/72 | HR 50 | Wt 165.8 lb

## 2021-07-12 DIAGNOSIS — I130 Hypertensive heart and chronic kidney disease with heart failure and stage 1 through stage 4 chronic kidney disease, or unspecified chronic kidney disease: Secondary | ICD-10-CM | POA: Insufficient documentation

## 2021-07-12 DIAGNOSIS — Z79899 Other long term (current) drug therapy: Secondary | ICD-10-CM | POA: Diagnosis not present

## 2021-07-12 DIAGNOSIS — I428 Other cardiomyopathies: Secondary | ICD-10-CM | POA: Insufficient documentation

## 2021-07-12 DIAGNOSIS — N183 Chronic kidney disease, stage 3 unspecified: Secondary | ICD-10-CM | POA: Insufficient documentation

## 2021-07-12 DIAGNOSIS — I471 Supraventricular tachycardia: Secondary | ICD-10-CM | POA: Insufficient documentation

## 2021-07-12 DIAGNOSIS — I5022 Chronic systolic (congestive) heart failure: Secondary | ICD-10-CM | POA: Diagnosis not present

## 2021-07-12 LAB — BASIC METABOLIC PANEL
Anion gap: 8 (ref 5–15)
BUN: 29 mg/dL — ABNORMAL HIGH (ref 6–20)
CO2: 28 mmol/L (ref 22–32)
Calcium: 8.6 mg/dL — ABNORMAL LOW (ref 8.9–10.3)
Chloride: 102 mmol/L (ref 98–111)
Creatinine, Ser: 1.67 mg/dL — ABNORMAL HIGH (ref 0.44–1.00)
GFR, Estimated: 35 mL/min — ABNORMAL LOW (ref >60)
Glucose, Bld: 112 mg/dL — ABNORMAL HIGH (ref 70–99)
Potassium: 4.5 mmol/L (ref 3.5–5.1)
Sodium: 138 mmol/L (ref 135–145)

## 2021-07-12 MED ORDER — AMLODIPINE BESYLATE 5 MG PO TABS
5.0000 mg | ORAL_TABLET | Freq: Every day | ORAL | 3 refills | Status: DC
Start: 2021-07-12 — End: 2021-10-08

## 2021-07-12 NOTE — Patient Instructions (Signed)
It was a pleasure seeing you today!  MEDICATIONS: -We are changing your medications today -Start amlodipine 5 mg (1 tablet) daily -Call if you have questions about your medications.  LABS: -We will call you if your labs need attention.  NEXT APPOINTMENT: Return to clinic in 1 month with Dr. [name].  In general, to take care of your heart failure: -Limit your fluid intake to 2 Liters (half-gallon) per day.   -Limit your salt intake to ideally 2-3 grams (2000-3000 mg) per day. -Weigh yourself daily and record, and bring that "weight diary" to your next appointment.  (Weight gain of 2-3 pounds in 1 day typically means fluid weight.) -The medications for your heart are to help your heart and help you live longer.   -Please contact us before stopping any of your heart medications.  Call the clinic at [phone] with questions or to reschedule future appointments.

## 2021-07-23 DIAGNOSIS — I5042 Chronic combined systolic (congestive) and diastolic (congestive) heart failure: Secondary | ICD-10-CM | POA: Diagnosis not present

## 2021-07-23 DIAGNOSIS — E785 Hyperlipidemia, unspecified: Secondary | ICD-10-CM | POA: Diagnosis not present

## 2021-07-23 DIAGNOSIS — I10 Essential (primary) hypertension: Secondary | ICD-10-CM | POA: Diagnosis not present

## 2021-07-23 DIAGNOSIS — E1165 Type 2 diabetes mellitus with hyperglycemia: Secondary | ICD-10-CM | POA: Diagnosis not present

## 2021-07-28 IMAGING — CT CT CHEST HIGH RESOLUTION W/O CM
2 of 7 series · 15 of 36 positions shown, 18 images · non-contrast
Comparison: None.

CLINICAL DATA: Cardiomyopathy.  Question sarcoid.

EXAM:
CT CHEST WITHOUT CONTRAST
TECHNIQUE: Multidetector CT imaging of the chest was performed following the
standard protocol without intravenous contrast. High resolution
imaging of the lungs, as well as inspiratory and expiratory imaging,
was performed.

[Series 4: high resolution retro · axial · 0.62mm/px · z∈[-250,-2]mm · 12 of 294 slices shown, 15 images]
[im 23/294  mediastinal]
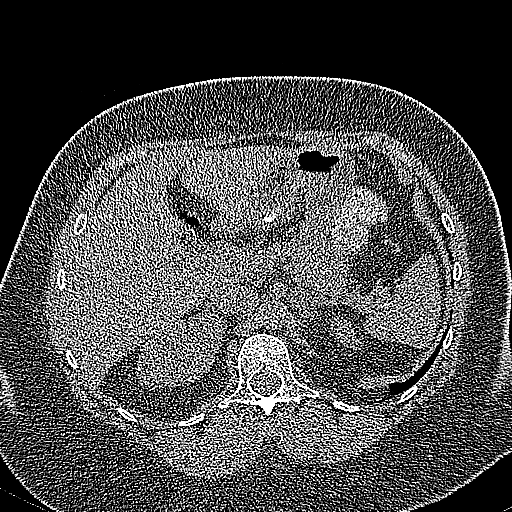
[im 23/294  lung]
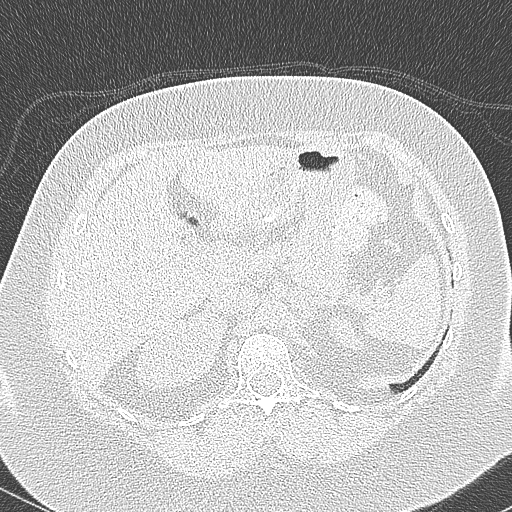
[im 46/294  lung]
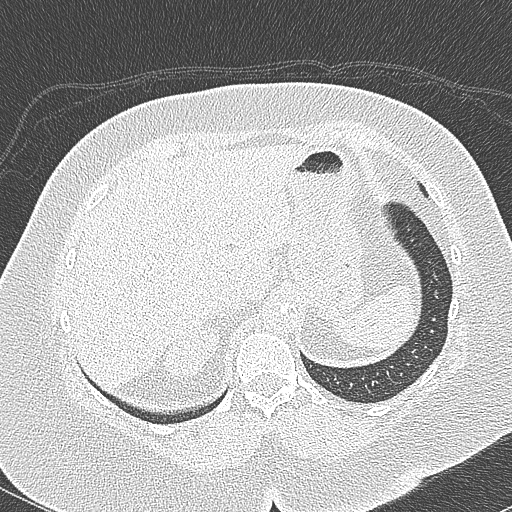
[im 68/294  lung]
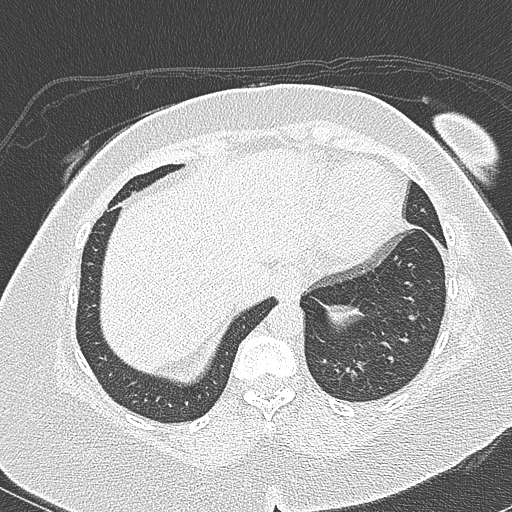
[im 91/294  lung]
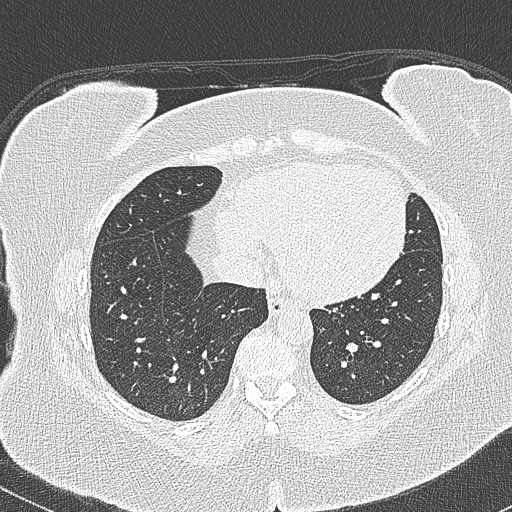
[im 113/294  mediastinal]
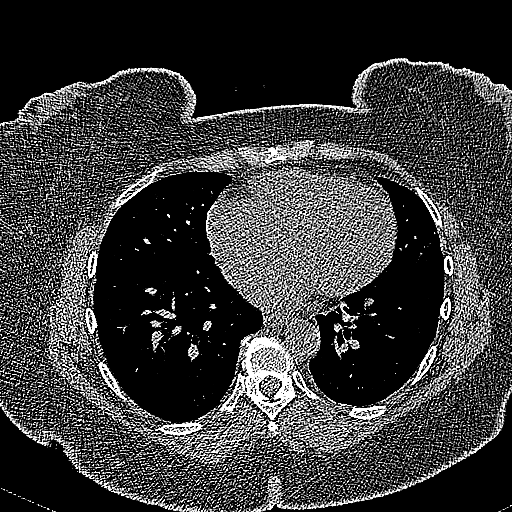
[im 113/294  lung]
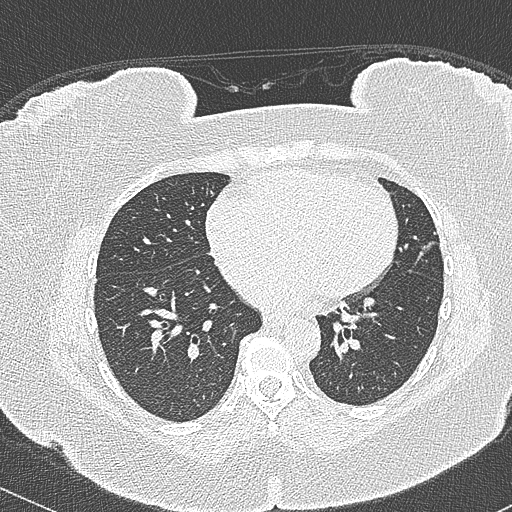
[im 136/294  lung]
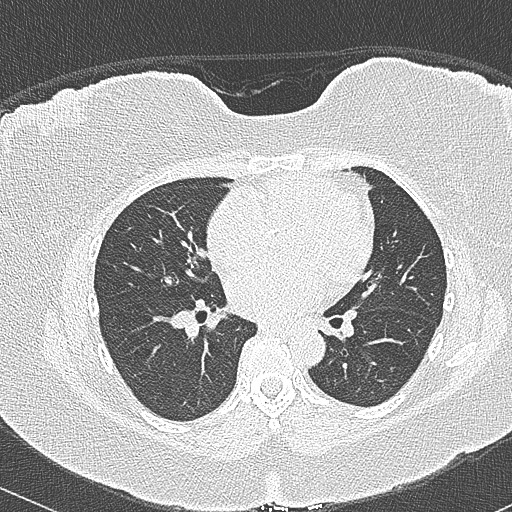
[im 158/294  lung]
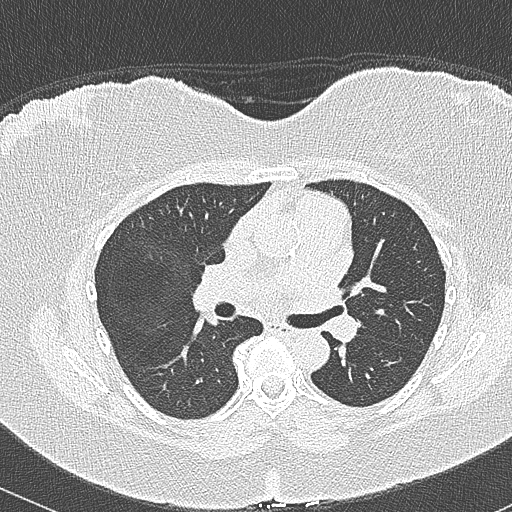
[im 181/294  lung]
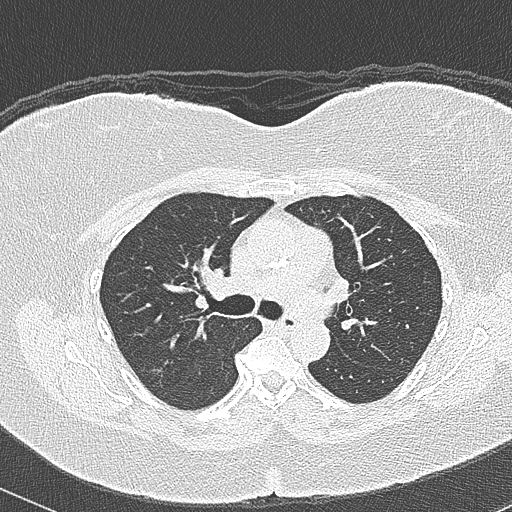
[im 203/294  mediastinal]
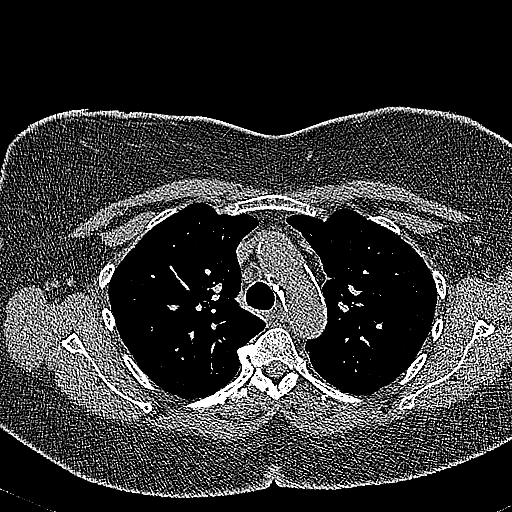
[im 203/294  lung]
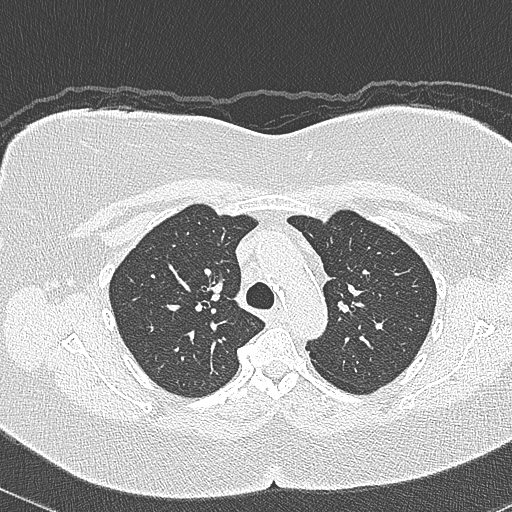
[im 226/294  lung]
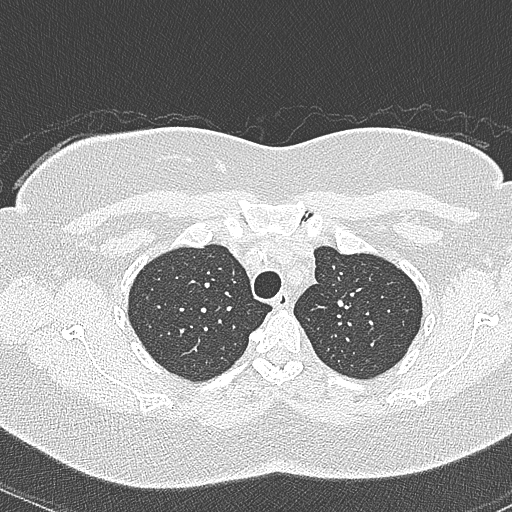
[im 248/294  lung]
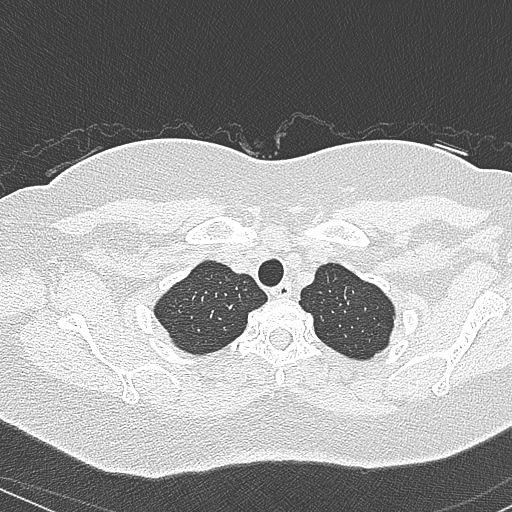
[im 271/294  lung]
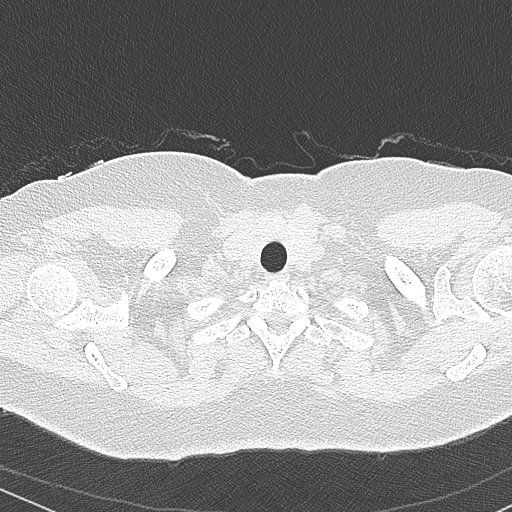

[Series 7: coronal · coronal · 0.60mm/px · 3 of 87 slices shown]
[im 18/87  lung]
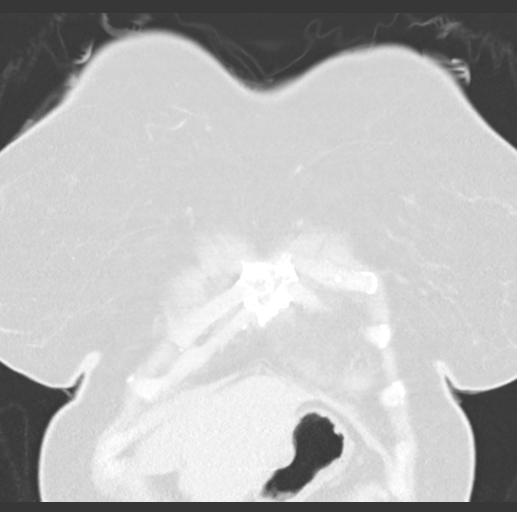
[im 35/87  lung]
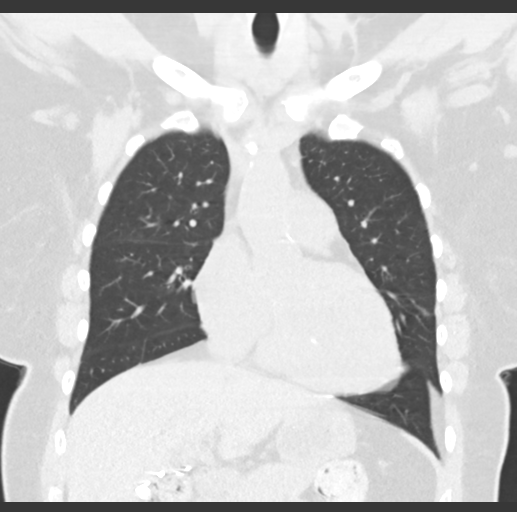
[im 52/87  lung]
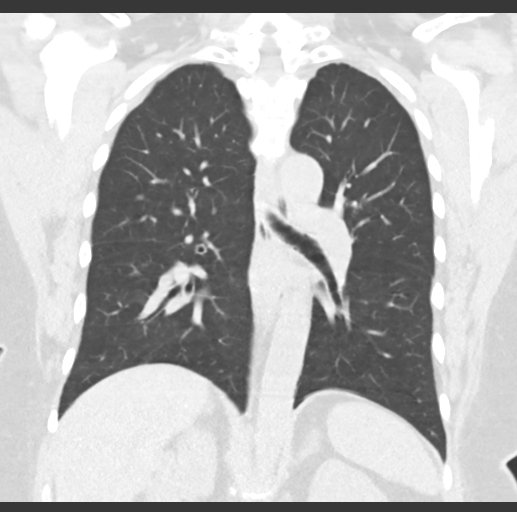

[15 of 36 positions shown; findings below may reference images not displayed]

FINDINGS: Cardiovascular: Atherosclerotic calcification of the aorta. Heart is
at the upper limits of normal in size. No pericardial effusion.

Mediastinum/Nodes: Low-attenuation right thyroid nodule measures
cm. Mediastinal lymph nodes are not enlarged by CT size criteria.
Hilar regions are difficult to definitively evaluate without IV
contrast. No axillary adenopathy. Esophagus is grossly unremarkable.

Lungs/Pleura: Negative for subpleural reticulation, traction
bronchiectasis/bronchiolectasis, ground-glass, architectural
distortion or honeycombing. No Peri lymphatic nodularity. Minimal
linear scarring in the lingula. No pleural fluid. Airway is
unremarkable. Expiratory phase imaging was not performed in true
expiration, limiting the evaluation for air trapping.

Upper Abdomen: Visualized portions of the liver and right adrenal
gland are unremarkable. Thickening of the left adrenal gland.
Visualized portions of the kidneys, spleen, pancreas, stomach and
bowel are grossly unremarkable. Cholecystectomy. No upper abdominal
adenopathy.

Musculoskeletal: Degenerative changes in the spine. No worrisome
lytic or sclerotic lesions.
IMPRESSION: 1. No evidence of interstitial lung disease or sarcoid.
2. 1.6 cm low-attenuation right thyroid nodule. Thyroid ultrasound
recommended. (Ref: [HOSPITAL]. [DATE]): 143-50).
3.  Aortic atherosclerosis ([id number]-TO0.0).

## 2021-08-06 ENCOUNTER — Other Ambulatory Visit: Payer: Self-pay

## 2021-08-06 ENCOUNTER — Encounter (HOSPITAL_COMMUNITY): Payer: Self-pay | Admitting: Cardiology

## 2021-08-06 ENCOUNTER — Ambulatory Visit (HOSPITAL_BASED_OUTPATIENT_CLINIC_OR_DEPARTMENT_OTHER)
Admission: RE | Admit: 2021-08-06 | Discharge: 2021-08-06 | Disposition: A | Discharge status: Home / Self Care | Payer: Medicare Other | Source: Ambulatory Visit | Attending: Cardiology | Admitting: Cardiology

## 2021-08-06 ENCOUNTER — Ambulatory Visit (HOSPITAL_COMMUNITY)
Admission: RE | Admit: 2021-08-06 | Discharge: 2021-08-06 | Disposition: A | Discharge status: Home / Self Care | Payer: Medicare Other | Source: Ambulatory Visit | Attending: Cardiology | Admitting: Cardiology

## 2021-08-06 VITALS — BP 170/90 | HR 62 | Wt 167.2 lb

## 2021-08-06 DIAGNOSIS — I5022 Chronic systolic (congestive) heart failure: Secondary | ICD-10-CM | POA: Insufficient documentation

## 2021-08-06 DIAGNOSIS — R001 Bradycardia, unspecified: Secondary | ICD-10-CM | POA: Insufficient documentation

## 2021-08-06 DIAGNOSIS — E785 Hyperlipidemia, unspecified: Secondary | ICD-10-CM | POA: Diagnosis not present

## 2021-08-06 DIAGNOSIS — Z79899 Other long term (current) drug therapy: Secondary | ICD-10-CM | POA: Insufficient documentation

## 2021-08-06 DIAGNOSIS — E1122 Type 2 diabetes mellitus with diabetic chronic kidney disease: Secondary | ICD-10-CM | POA: Diagnosis not present

## 2021-08-06 DIAGNOSIS — I5042 Chronic combined systolic (congestive) and diastolic (congestive) heart failure: Secondary | ICD-10-CM

## 2021-08-06 DIAGNOSIS — I4891 Unspecified atrial fibrillation: Secondary | ICD-10-CM | POA: Diagnosis not present

## 2021-08-06 DIAGNOSIS — Z8249 Family history of ischemic heart disease and other diseases of the circulatory system: Secondary | ICD-10-CM | POA: Diagnosis not present

## 2021-08-06 DIAGNOSIS — N183 Chronic kidney disease, stage 3 unspecified: Secondary | ICD-10-CM | POA: Insufficient documentation

## 2021-08-06 DIAGNOSIS — I447 Left bundle-branch block, unspecified: Secondary | ICD-10-CM | POA: Insufficient documentation

## 2021-08-06 DIAGNOSIS — Z7984 Long term (current) use of oral hypoglycemic drugs: Secondary | ICD-10-CM | POA: Diagnosis not present

## 2021-08-06 DIAGNOSIS — E041 Nontoxic single thyroid nodule: Secondary | ICD-10-CM | POA: Diagnosis not present

## 2021-08-06 DIAGNOSIS — I130 Hypertensive heart and chronic kidney disease with heart failure and stage 1 through stage 4 chronic kidney disease, or unspecified chronic kidney disease: Secondary | ICD-10-CM | POA: Diagnosis not present

## 2021-08-06 DIAGNOSIS — I471 Supraventricular tachycardia: Secondary | ICD-10-CM | POA: Insufficient documentation

## 2021-08-06 LAB — COMPREHENSIVE METABOLIC PANEL
ALT: 19 U/L (ref 0–44)
AST: 25 U/L (ref 15–41)
Albumin: 3.8 g/dL (ref 3.5–5.0)
Alkaline Phosphatase: 83 U/L (ref 38–126)
Anion gap: 9 (ref 5–15)
BUN: 22 mg/dL — ABNORMAL HIGH (ref 6–20)
CO2: 26 mmol/L (ref 22–32)
Calcium: 8.6 mg/dL — ABNORMAL LOW (ref 8.9–10.3)
Chloride: 101 mmol/L (ref 98–111)
Creatinine, Ser: 1.05 mg/dL — ABNORMAL HIGH (ref 0.44–1.00)
GFR, Estimated: >60 mL/min (ref >60)
Glucose, Bld: 122 mg/dL — ABNORMAL HIGH (ref 70–99)
Potassium: 4.4 mmol/L (ref 3.5–5.1)
Sodium: 136 mmol/L (ref 135–145)
Total Bilirubin: 0.5 mg/dL (ref 0.3–1.2)
Total Protein: 7.6 g/dL (ref 6.5–8.1)

## 2021-08-06 LAB — ECHOCARDIOGRAM COMPLETE
Area-P 1/2: 2.87 cm2
S' Lateral: 2.7 cm

## 2021-08-06 LAB — TSH: TSH: 1.247 u[IU]/mL (ref 0.350–4.500)

## 2021-08-06 NOTE — Patient Instructions (Addendum)
Labs done today. We will contact you only if your labs are abnormal.  No medication changes were made. Please continue all current medications as prescribed.  Your physician has requested that you regularly monitor and record your blood pressure readings at home. Please use the same machine at the same time of day to check your readings and record them to bring to your follow-up visit.  You have been referred to Cardiac Electrophysiology. They will contact you to schedule an appointment.   Your physician recommends that you schedule a follow-up appointment in: 2 months with our APP Clinic here in our office.  If you have any questions or concerns before your next appointment please send [place] a message through [place] or call our office at [phone].    TO LEAVE A MESSAGE FOR THE NURSE SELECT OPTION 2, PLEASE LEAVE A MESSAGE INCLUDING: YOUR NAME DATE OF BIRTH CALL BACK NUMBER REASON FOR CALL**this is important as we prioritize the call backs  YOU WILL RECEIVE A CALL BACK THE SAME DAY AS LONG AS YOU CALL BEFORE 4:00 PM   Do the following things EVERYDAY: Weigh yourself in the morning before breakfast. Write it down and keep it in a log. Take your medicines as prescribed Eat low salt foods--Limit salt (sodium) to 2000 mg per day.  Stay as active as you can everyday Limit all fluids for the day to less than 2 liters   At the [facility], you and your health needs are our priority. As part of our continuing mission to provide you with exceptional heart care, we have created designated Provider Care Teams. These Care Teams include your primary Cardiologist (physician) and Advanced Practice Providers (APPs- Physician Assistants and Nurse Practitioners) who all work together to provide you with the care you need, when you need it.   You may see any of the following providers on your designated Care Team at your next follow up: Dr Glori Bickers Dr Haynes Kerns, NP Lyda Jester, [place] Audry Riles, PharmD   Please be sure to bring in all your medications bottles to every appointment.

## 2021-08-06 NOTE — Progress Notes (Signed)
PCP: Rosita Fire, MD Cardiology: Dr [name]  60 y.o. with history of HTN, SVT, and mild nonischemic cardiomopathy.  In 5/21, echo showed EF 45% with severe anteroseptal hypokinesis.  LHC in 5/21 showed normal coronaries.  She has LBBB at baseline.  She has been noted to have SVT in the past.    On 02/11/21, she went to the ER at [facility] with abdominal pain and distention.  She was noted to be in atrial tachycardia with rate around 150.  She was hypotensive and got IVF, was also transiently on norepinephrine.  HR dropped transiently to the 40s with junctional bradycardia per report. AKI with creatinine up to 2.21.  Echo in 3/22 showed EF 25-30%, moderate RV enlargement and moderately decreased RV systolic function with D-shaped septum, biatrial enlargement.  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  No bradycardia noted at [facility], and she remained in NSR on amiodarone.  EP thought that ablation of the atrial tachycardia would be difficult and recommended ongoing management with amiodarone.  LHC/RHC after diuresis showed normal filling pressures, preserved cardiac output, and no significant CAD.  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   Echo was done today and showed EF up to 55-60% with mild LVH, normal RV, and PASP 21 mmHg.   She returns for followup of CHF.  Weight up 2 lbs.  BP elevated here today, but she says that SBP 120s-130s when she checks at home (daily).  HR remains generally in the upper 40s-50s.  No significant exertional dyspnea or chest pain.  No lightheadedness.  She is in NSR today on amiodarone.  Of note, patient snores and has some daytime sleepiness.   Labs (3/22): K 4.6, creatinine 1.7 Labs (4/22): ACE level normal, LFTs normal Labs (5/22): K 4.5, creatinine 0.85 Labs (8/22): K 4.5, creatinine 1.67  Past Medical History: 1. Atrial tachycardia 2. HTN 3. Nonischemic cardiomyopathy: Echo in  5/21 with EF 45%, severe anteroseptal HK.  - LHC (5/21): normal coronaries - Echo (3/22): EF 25-30%, moderate RV enlargement and moderately decreased RV systolic function with D-shaped septum, biatrial enlargement.  - RHC/LHC (3/22): No significant coronary disease; mean RA 1, PA 35/7, mean PCWP 4, CI 3.5 F, 2.8 T. - Cardiac MRI (3/22): Normal LV size with EF 20%, diffuse hypokinesis with septal-lateral dyssynchrony c/w LBBB. Normal RV size with EF 26%.  Difficult delayed enhancement imaging, however there does appear to be LGE in the septum that could be in a coronary disease-type pattern (<50% wall thickness subendocardial LGE in the mid-apical septal wall). - High resolution CT chest (4/22) with no evidence for pulmonary sarcoidosis or ILD.  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  - Echo (9/22): EF 55-60% with mild LVH, normal RV, and PASP 21 mmHg 4. Thyroid nodules: Benign on 2022 biopsy.   FH: Mother and niece with CHF.   Social History   Socioeconomic History   Marital status: Single    Spouse name: Not on file   Number of children: Not on file   Years of education: Not on file   Highest education level: Not on file  Occupational History   Not on file  Tobacco Use   Smoking status: Never   Smokeless tobacco: Never  Vaping Use   Vaping Use: Never used  Substance and Sexual Activity   Alcohol use: No    Alcohol/week: 0.0 standard drinks   Drug  use: No   Sexual activity: Not Currently    Birth control/protection: Post-menopausal  Other Topics Concern   Not on file  Social History Narrative   Not on file   Social Determinants of Health   Financial Resource Strain: Low Risk    Difficulty of Paying Living Expenses: Not hard at all  Food Insecurity: No Food Insecurity   Worried About Charity fundraiser in the Last Year: Never true   [place] in the Last Year: Never true  Transportation Needs: No Transportation Needs    Lack of Transportation (Medical): No   Lack of Transportation (Non-Medical): No  Physical Activity: Sufficiently Active   Days of Exercise per Week: 7 days   Minutes of Exercise per Session: 30 min  Stress: No Stress Concern Present   Feeling of Stress : Not at all  Social Connections: Unknown   Frequency of Communication with Friends and Family: More than three times a week   Frequency of Social Gatherings with Friends and Family: Once a week   Attends Religious Services: 1 to 4 times per year   Active Member of [employer] or Organizations: No   Attends Music therapist: Never   Marital Status: Patient refused  Human resources officer Violence: Not At Risk   Fear of Current or Ex-Partner: No   Emotionally Abused: No   Physically Abused: No   Sexually Abused: No   ROS: All systems reviewed and negative except as per HPI.   Current Outpatient Medications  Medication Sig Dispense Refill   albuterol (VENTOLIN HFA) 108 (90 Base) MCG/ACT inhaler Inhale 1-2 puffs into the lungs every 4 (four) hours as needed.     amiodarone (PACERONE) 200 MG tablet Take 0.5 tablets (100 mg total) by mouth daily. 45 tablet 3   amLODipine (NORVASC) 5 MG tablet Take 1 tablet (5 mg total) by mouth daily. 90 tablet 3   atorvastatin (LIPITOR) 80 MG tablet Take 1 tablet (80 mg total) by mouth daily. 90 tablet 3   dapagliflozin propanediol (FARXIGA) 10 MG TABS tablet TAKE 1 TABLET (10 MG TOTAL) BY MOUTH DAILY. 30 tablet 6   isosorbide-hydrALAZINE (BIDIL) 20-37.5 MG tablet Take 2 tablets by mouth 3 (three) times daily. 180 tablet 5   sacubitril-valsartan (ENTRESTO) 97-103 MG Take 1 tablet by mouth 2 (two) times daily. 60 tablet 11   spironolactone (ALDACTONE) 25 MG tablet TAKE 1 TABLET (25 MG TOTAL) BY MOUTH DAILY. 30 tablet 6   No current facility-administered medications for this encounter.   BP (!) 170/90   Pulse 62   Wt 75.8 kg (167 lb 3.2 oz)   LMP 05/23/2007   SpO2 97%   BMI 27.40 kg/m  General:  NAD Neck: No JVD, no thyromegaly or thyroid nodule.  Lungs: Clear to auscultation bilaterally with normal respiratory effort. CV: Nondisplaced PMI.  Heart regular S1/S2, no S3/S4, no murmur.  No peripheral edema.  No carotid bruit.  Normal pedal pulses.  Abdomen: Soft, nontender, no hepatosplenomegaly, no distention.  Skin: Intact without lesions or rashes.  Neurologic: Alert and oriented x 3.  Psych: Normal affect. Extremities: No clubbing or cyanosis.  HEENT: Normal.   Assessment/Plan: 1. Chronic systolic CHF:  Nonischemic cardiomyopathy based on 5/21 cath, echo at that time showed EF 45%.  She was admitted 3/22 with volume overload and atrial tachycardia with HR in 150s. Back in NSR with amiodarone. Echo in 3/22 with EF 25-30%, moderate RV enlargement and moderately decreased RV systolic function with  D-shaped septum, biatrial enlargement.  CMRI with LV EF 20% RV EF 26%, technically difficult but there was delayed enhancement suggestive of possible coronary disease-type pattern, however LHC repeated in 3/22 showed no CAD. RHC showed low filling pressures and preserved CO.  Cause of cardiomyopathy uncertain, cannot rule out cardiac sarcoidosis as cause of MRI pattern though high resolution CT chest did not show evidence for pulmonary sarcoidosis.  Tachy-mediated component is certainly possible as well (from atrial tachycardia).  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  NYHA class II, she is not volume overloaded on exam.  - She has not needed Lasix.  - Continue Farxiga 10 mg daily.   - Continue Bidil 2 tab tid.  - Continue spironolactone 25 mg daily.  - Continue Entresto 97/103 bid.  BMET today.  - With mild bradycardia, I have not started her on Coreg.  - She is now out of ICD range.    - With improvement in EF, think we can hold off on cardiac PET (?cardiac sarcoidosis).  2. Atrial tachycardia with RVR: 3/22 admission.  I suspect that this contributed to cardiomyopathy.  HR in upper 40s-50s and regular today.    - Continue amiodarone to 100 mg daily, check LFTs and TSH and will need regular eye exam.  - As I suspect she had a tachycardia-mediated CMP, I would like her to see Dr. Curt Bears to discuss ablation.  3. CKD: Stage 3.  BMET today.  4. HTN: BP elevated today but SBP 120s-130s when she checks at home.  She is on amlodipine in addition to the above meds.   - Check BP daily at home and record, bring numbers to next appt.  - OSA may contribute to HTN, I will arrange for home sleep study.   Followup in 2 months with APP to reassess BP.   Loralie Champagne 08/06/2021

## 2021-08-06 NOTE — Progress Notes (Signed)
  Echocardiogram 2D Echocardiogram has been performed.  Michelle Morrison 08/06/2021, 10:48 AM

## 2021-08-23 DIAGNOSIS — E1165 Type 2 diabetes mellitus with hyperglycemia: Secondary | ICD-10-CM | POA: Diagnosis not present

## 2021-08-23 DIAGNOSIS — I10 Essential (primary) hypertension: Secondary | ICD-10-CM | POA: Diagnosis not present

## 2021-09-22 DIAGNOSIS — E1165 Type 2 diabetes mellitus with hyperglycemia: Secondary | ICD-10-CM | POA: Diagnosis not present

## 2021-09-22 DIAGNOSIS — I10 Essential (primary) hypertension: Secondary | ICD-10-CM | POA: Diagnosis not present

## 2021-09-22 DIAGNOSIS — I5042 Chronic combined systolic (congestive) and diastolic (congestive) heart failure: Secondary | ICD-10-CM | POA: Diagnosis not present

## 2021-09-25 ENCOUNTER — Ambulatory Visit (INDEPENDENT_AMBULATORY_CARE_PROVIDER_SITE_OTHER): Payer: Medicare Other | Admitting: Cardiology

## 2021-09-25 ENCOUNTER — Other Ambulatory Visit: Payer: Self-pay

## 2021-09-25 ENCOUNTER — Telehealth: Payer: Self-pay | Admitting: Cardiology

## 2021-09-25 ENCOUNTER — Encounter: Payer: Self-pay | Admitting: Cardiology

## 2021-09-25 VITALS — BP 130/72 | HR 78 | Ht 65.0 in | Wt 168.0 lb

## 2021-09-25 DIAGNOSIS — I471 Supraventricular tachycardia: Secondary | ICD-10-CM | POA: Diagnosis not present

## 2021-09-25 MED ORDER — DRONEDARONE HCL 400 MG PO TABS: 400.0000 mg | ORAL_TABLET | Freq: Two times a day (BID) | ORAL | 3 refills | Status: DC

## 2021-09-25 NOTE — Progress Notes (Signed)
Electrophysiology Office Note   Date:  09/25/2021   ID:  MOXIE KALIL, DOB 23-Apr-1961, MRN [id number]  PCP:  Rosita Fire, MD  Cardiologist:  [name] Primary Electrophysiologist:  Lataunya Ruud Meredith Leeds, MD    Chief Complaint: SVT   History of Present Illness: Michelle Morrison is a 60 y.o. female who is being seen today for the evaluation of SVT at the request of [name], MD. Presenting today for electrophysiology evaluation.  She has a history significant for hypertension, SVT, nonischemic cardiomyopathy.  May 2021 she had an echo that showed an ejection fraction of 45% with severe anteroseptal hypokinesis.  Left heart catheterization showed normal coronary arteries.  She does have a left bundle branch block at baseline.  She is also had SVT in the past.  On 01/30/2021 she went to [facility] emergency room with abdominal pain and distention.  She was noted to be in atrial tachycardia with heart rates of 150 bpm.  She was hypotensive and got IV fluids and was transiently on norepinephrine.  Heart rate dropped into the 40s with junctional bradycardia per report.  Her creatinine was elevated at 2.21.  Echo showed an ejection fraction of 25 to 30%.  She was transferred to [facility].  She was started on amiodarone.  It was thought that she should have ongoing management with amiodarone and that atrial tachycardia ablation would be quite difficult.  Today, she denies symptoms of palpitations, chest pain, shortness of breath, orthopnea, PND, lower extremity edema, claudication, dizziness, presyncope, syncope, bleeding, or neurologic sequela. The patient is tolerating medications without difficulties.    Past Medical History:  Diagnosis Date   CHF (congestive heart failure) ([facility])    a. EF 45% by echo in 03/2020 with normal cors by cath   Diabetes mellitus without complication ([facility])    Diarrhea    Herpes simplex infection    Hyperlipidemia    Hypertension    Past Surgical  History:  Procedure Laterality Date   CESAREAN SECTION     CHOLECYSTECTOMY     COLONOSCOPY N/A 08/26/2014   Procedure: COLONOSCOPY;  Surgeon: [name], MD;  Location: [facility];  Service: Endoscopy;  Laterality: N/A;  9:30 AM - moved to 8:30 - Ginger to notify pt   LEFT HEART CATH AND CORONARY ANGIOGRAPHY N/A 04/14/2020   Procedure: LEFT HEART CATH AND CORONARY ANGIOGRAPHY;  Surgeon: [name], [name], MD;  Location: [facility];  Service: Cardiovascular;  Laterality: N/A;   RIGHT/LEFT HEART CATH AND CORONARY ANGIOGRAPHY N/A 02/19/2021   Procedure: RIGHT/LEFT HEART CATH AND CORONARY ANGIOGRAPHY;  Surgeon: [name], MD;  Location: [facility] CV LAB;  Service: Cardiovascular;  Laterality: N/A;     Current Outpatient Medications  Medication Sig Dispense Refill   albuterol (VENTOLIN HFA) 108 (90 Base) MCG/ACT inhaler Inhale 1-2 puffs into the lungs every 4 (four) hours as needed.     amLODipine (NORVASC) 5 MG tablet Take 1 tablet (5 mg total) by mouth daily. 90 tablet 3   atorvastatin (LIPITOR) 80 MG tablet Take 1 tablet (80 mg total) by mouth daily. 90 tablet 3   dapagliflozin propanediol (FARXIGA) 10 MG TABS tablet TAKE 1 TABLET (10 MG TOTAL) BY MOUTH DAILY. 30 tablet 6   [START ON 10/17/2021] dronedarone (MULTAQ) 400 MG tablet Take 1 tablet (400 mg total) by mouth 2 (two) times daily with a meal. 60 tablet 3   isosorbide-hydrALAZINE (BIDIL) 20-37.5 MG tablet Take 2 tablets by  mouth 3 (three) times daily. 180 tablet 5   sacubitril-valsartan (ENTRESTO) 97-103 MG Take 1 tablet by mouth 2 (two) times daily. 60 tablet 11   spironolactone (ALDACTONE) 25 MG tablet TAKE 1 TABLET (25 MG TOTAL) BY MOUTH DAILY. 30 tablet 6   No current facility-administered medications for this visit.    Allergies:   Patient has no known allergies.   Social History:  The patient  reports that she has never smoked. She has never used smokeless tobacco. She reports that she does not drink alcohol  and does not use drugs.   Family History:  The patient's family history includes Diabetes in her mother; Hypertension in her mother.    ROS:  Please see the history of present illness.   Otherwise, review of systems is positive for none.   All other systems are reviewed and negative.    PHYSICAL EXAM: VS:  BP 130/72   Pulse 78   Ht 5\' 5"  (1.651 m)   Wt 168 lb (76.2 kg)   LMP 05/23/2007   SpO2 97%   BMI 27.96 kg/m  , BMI Body mass index is 27.96 kg/m. GEN: Well nourished, well developed, in no acute distress  HEENT: normal  Neck: no JVD, carotid bruits, or masses Cardiac: RRR; no murmurs, rubs, or gallops,no edema  Respiratory:  clear to auscultation bilaterally, normal work of breathing GI: soft, nontender, nondistended, + BS MS: no deformity or atrophy  Skin: warm and dry Neuro:  Strength and sensation are intact Psych: euthymic mood, full affect  EKG:  EKG is ordered today. Personal review of the ekg ordered shows sinus rhythm, rate 78, IVCD  Recent Labs: 02/11/2021: B Natriuretic Peptide 1,760.0 02/20/2021: Hemoglobin 10.8; Platelets 351 04/11/2021: Magnesium 1.9 08/06/2021: ALT 19; BUN 22; Creatinine, Ser 1.05; Potassium 4.4; Sodium 136; TSH 1.247    Lipid Panel     Component Value Date/Time   CHOL 172 04/14/2020 0903   TRIG 52 04/14/2020 0903   HDL 62 04/14/2020 0903   CHOLHDL 2.8 04/14/2020 0903   VLDL 10 04/14/2020 0903   LDLCALC 100 (H) 04/14/2020 0903     Wt Readings from Last 3 Encounters:  09/25/21 168 lb (76.2 kg)  08/06/21 167 lb 3.2 oz (75.8 kg)  07/12/21 165 lb 12.8 oz (75.2 kg)      Other studies Reviewed: Additional studies/ records that were reviewed today include: TTE 08/06/21  Review of the above records today demonstrates:   1. Left ventricular ejection fraction, by estimation, is 55 to 60%. The  left ventricle has normal function. The left ventricle has no regional  wall motion abnormalities. There is mild left ventricular hypertrophy.   Left ventricular diastolic parameters  are consistent with Grade Michelle diastolic dysfunction (impaired relaxation).   2. Right ventricular systolic function is normal. The right ventricular  size is normal. There is normal pulmonary artery systolic pressure. The  estimated right ventricular systolic pressure is 66.5 mmHg.   3. The mitral valve is normal in structure. No evidence of mitral valve  regurgitation. No evidence of mitral stenosis.   4. The aortic valve is tricuspid. Aortic valve regurgitation is not  visualized. No aortic stenosis is present.   5. The inferior vena cava is normal in size with greater than 50%  respiratory variability, suggesting right atrial pressure of 3 mmHg.   RHC/LHC 02/19/21 1. Low filling pressures.  2. Preserved cardiac output.  3. No significant CAD.   ASSESSMENT AND PLAN:  1.  Chronic systolic  heart failure due to nonischemic cardiomyopathy: Has had a recent echo that shows an ejection fraction of 55 to 60%.  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  Currently on optimal medical therapy per heart failure team.  Michelle Morrison continue with current management.  2.  Atrial tachycardia: Currently on amiodarone 100 mg daily.  High risk medication monitoring via ECG.  She would like to be off of amiodarone due to long-term side effects.  Michelle did discuss the possibility of ablation, though with amiodarone in her system, it may be quite difficult to start her atrial arrhythmia.  Michelle Morrison stop her amiodarone today.  In 3 weeks Michelle Michelle Morrison start her on Multaq.  Michelle Morrison see her back in 3 months to see if her amiodarone has washed out and if so, Michelle Michelle Morrison potentially plan for ablation at that time.  3.  Hypertension: Currently well controlled  Case discussed with primary cardiology  Current medicines are reviewed at length with the patient today.   The patient does not have concerns regarding her medicines.  The following changes were made  today: Stop amiodarone, start Multaq  Labs/ tests ordered today include:  Orders Placed This Encounter  Procedures   EKG 12-Lead     Disposition:   FU with Michelle Morrison 3 months  Signed, Michelle Salinger Meredith Leeds, MD  09/25/2021 9:36 AM     [address] [place] [address] [phone] (office) [phone] (fax)

## 2021-09-25 NOTE — Telephone Encounter (Signed)
Pt educated to Multaq dosing. She will try medication. She appreciates the follow up call.

## 2021-09-25 NOTE — Telephone Encounter (Signed)
Pt c/o medication issue:  1. Name of Medication:  dronedarone (MULTAQ) 400 MG tablet  2. How are you currently taking this medication (dosage and times per day)? Not currently taking  3. Are you having a reaction (difficulty breathing--STAT)? No   4. What is your medication issue? Allex is calling stating she is wanting to know if she could be started at a lower dose. She states when she starts medications over 200 MG's she normally gets sick throwing up and would like to start at 200 MG's so her body can adjust first. Please advise.

## 2021-09-25 NOTE — Patient Instructions (Addendum)
Medication Instructions:  Your physician has recommended you make the following change in your medication:  STOP Amiodarone In 3 weeks, starting 11/23 -- start Multaq 400 mg TWICE a day  *If you need a refill on your cardiac medications before your next appointment, please call your pharmacy*   Lab Work: None ordered   Testing/Procedures: None ordered   Follow-Up: At [facility], you and your health needs are our priority.  As part of our continuing mission to provide you with exceptional heart care, we have created designated Provider Care Teams.  These Care Teams include your primary Cardiologist (physician) and Advanced Practice Providers (APPs -  Physician Assistants and Nurse Practitioners) who all work together to provide you with the care you need, when you need it.   Your next appointment:   3 month(s)  The format for your next appointment:   In Person  Provider:   Allegra Lai, MD    Thank you for choosing [facility]!!   Trinidad Curet, RN [phone]   Other Instructions   [employer] Patient connects for Multaq assistance (if needed): [phone]  Dronedarone tablets What is this medication? DRONEDARONE (droe NE da rone) is an antiarrhythmic drug. It helps make your heart beat regularly. This medicine may be used for other purposes; ask your health care provider or pharmacist if you have questions. COMMON BRAND NAME(S): Multaq What should I tell my care team before I take this medication? They need to know if you have any of these conditions: heart failure history of irregular heartbeat liver disease liver or lung problems with the past use of amiodarone low levels of magnesium in the blood low levels of potassium in the blood other heart disease an unusual or allergic reaction to dronedarone, other medicines, foods, dyes, or preservatives pregnant or trying to get pregnant breast-feeding How should I use this medication? Take this medicine  by mouth with a glass of water. Follow the directions on the prescription label. Take one tablet with the morning meal and one tablet with the evening meal. Do not take your medicine more often than directed. Do not stop taking except on the advice of your doctor or health care professional. A special MedGuide will be given to you by the pharmacist with each prescription and refill. Be sure to read this information carefully each time. Talk to your pediatrician regarding the use of this medicine in children. Special care may be needed. Overdosage: If you think you have taken too much of this medicine contact a poison control [facility] or emergency room at once. NOTE: This medicine is only for you. Do not share this medicine with others. What if I miss a dose? If you miss a dose, take it as soon as you can. If it is almost time for your next dose, take only that dose. Do not take double or extra doses. What may interact with this medication? Do not take this medicine with any of the following medications: arsenic trioxide certain antibiotics like clarithromycin, erythromycin, pentamidine, telithromycin, troleandomycin certain medicines for depression like tricyclic antidepressants certain medicines for fungal infections like fluconazole, itraconazole, ketoconazole, posaconazole, voriconazole certain medicines for irregular heart beat like amiodarone, disopyramide, flecainide, ibutilide, quinidine, propafenone, sotalol certain medicines for malaria like chloroquine, halofantrine cisapride cyclosporine droperidol haloperidol methadone other medicines that prolong the QT interval (cause an abnormal heart rhythm) like degarelix, encorafenib, entrectinib, eribulin, goserelin, lapatinib pimozide nefazodone phenothiazines like chlorpromazine, mesoridazine, prochlorperazine, thioridazine ritonavir ziprasidone This medicine may also interact with the following  medications: certain medicines for blood  pressure, heart disease, or irregular heart beat like diltiazem, metoprolol, propranolol, verapamil certain medicines for cholesterol like atorvastatin, lovastatin, simvastatin certain medicines for seizures like carbamazepine, phenobarbital, phenytoin digoxin dofetilide grapefruit juice rifampin sirolimus St. John's Wort tacrolimus This list may not describe all possible interactions. Give your health care provider a list of all the medicines, herbs, non-prescription drugs, or dietary supplements you use. Also tell them if you smoke, drink alcohol, or use illegal drugs. Some items may interact with your medicine. What should I watch for while using this medication? Your condition will be monitored closely when you first begin therapy. Often, this drug is first started in a hospital or other monitored health care setting. Once you are on maintenance therapy, visit your doctor or health care professional for regular checks on your progress. Because your condition and use of this medicine carry some risk, it is a good idea to carry an identification card, necklace or bracelet with details of your condition, medications, and doctor or health care professional. Dennis Bast may get drowsy or dizzy. Do not drive, use machinery, or do anything that needs mental alertness until you know how this medicine affects you. Do not stand or sit up quickly, especially if you are an older patient. This reduces the risk of dizzy or fainting spells. What side effects may I notice from receiving this medication? Side effects that you should report to your doctor or health care professional as soon as possible: allergic reactions like skin rash, itching or hives, swelling of the face, lips, or tongue breathing problems cough dark urine fast, irregular heartbeat general ill feeling or flu-like symptoms light-colored stools loss of appetite, nausea right upper belly pain slow heartbeat stomach pain swelling of the legs  or ankles unusually weak or tired weight gain yellowing of the eyes or skin Side effects that usually do not require medical attention (report to your doctor or health care professional if they continue or are bothersome): nausea vomiting This list may not describe all possible side effects. Call your doctor for medical advice about side effects. You may report side effects to FDA at 1-800-FDA-1088. Where should I keep my medication? Keep out of the reach of children. Store at room temperature between 15 and 30 degrees C (59 and 86 degrees F). Throw away any unused medicine after the expiration date. NOTE: This sheet is a summary. It may not cover all possible information. If you have questions about this medicine, talk to your doctor, pharmacist, or health care provider.  2022 Elsevier/Gold Standard (2019-10-13 10:12:41)

## 2021-09-26 NOTE — Telephone Encounter (Signed)
Left message to call back  

## 2021-09-26 NOTE — Telephone Encounter (Signed)
Patient is returning call.  °

## 2021-09-26 NOTE — Telephone Encounter (Signed)
Pt made aware that most likely insurance is requiring a PA for this medictaion. Aware I will forward note to that team to be on the look out for a PA request for Multaq.  Pt advised she will need to activate the copay card herself. Informed that I would send Rx in today so that PA process can begin.  Advised not to start the medication until we ensure she can afford it, along with waiting until 11/23 to start it as instructed by the office yesterday.

## 2021-09-26 NOTE — Telephone Encounter (Signed)
Patient states her insurance company won't cover the medication. She says she was also given a copay card, but it has to be activated.

## 2021-09-28 NOTE — Telephone Encounter (Signed)
**Note De-Identified Ajax Schroll Obfuscation** I called [facility] in [place] and was advised that a Multaq PA is not required and that the pts cost is $0.  Per pharmacy they will notify the pt that her Multaq RX is ready for pick up.  I called to advise the pt but got no answer so I left a message on her VM asking her to call Jeani Hawking back at Dr [employer] office at [facility] at [phone].

## 2021-09-28 NOTE — Telephone Encounter (Signed)
**Note De-Identified Armour Villanueva Obfuscation** I advised the pt that per [place] her cost for Multaq is $0. She expressed much appreciation towards our office for always helping her.

## 2021-10-08 ENCOUNTER — Ambulatory Visit (HOSPITAL_COMMUNITY)
Admission: RE | Admit: 2021-10-08 | Discharge: 2021-10-08 | Disposition: A | Discharge status: Home / Self Care | Payer: Medicare Other | Source: Ambulatory Visit | Attending: Family Medicine | Admitting: Family Medicine

## 2021-10-08 ENCOUNTER — Encounter (HOSPITAL_COMMUNITY): Payer: Self-pay

## 2021-10-08 ENCOUNTER — Other Ambulatory Visit: Payer: Self-pay

## 2021-10-08 VITALS — BP 152/60 | HR 62 | Wt 165.8 lb

## 2021-10-08 DIAGNOSIS — N183 Chronic kidney disease, stage 3 unspecified: Secondary | ICD-10-CM | POA: Insufficient documentation

## 2021-10-08 DIAGNOSIS — I471 Supraventricular tachycardia: Secondary | ICD-10-CM | POA: Diagnosis not present

## 2021-10-08 DIAGNOSIS — I10 Essential (primary) hypertension: Secondary | ICD-10-CM | POA: Diagnosis not present

## 2021-10-08 DIAGNOSIS — Z79899 Other long term (current) drug therapy: Secondary | ICD-10-CM | POA: Diagnosis not present

## 2021-10-08 DIAGNOSIS — I428 Other cardiomyopathies: Secondary | ICD-10-CM | POA: Diagnosis not present

## 2021-10-08 DIAGNOSIS — Z7984 Long term (current) use of oral hypoglycemic drugs: Secondary | ICD-10-CM | POA: Insufficient documentation

## 2021-10-08 DIAGNOSIS — I5042 Chronic combined systolic (congestive) and diastolic (congestive) heart failure: Secondary | ICD-10-CM | POA: Diagnosis not present

## 2021-10-08 DIAGNOSIS — I130 Hypertensive heart and chronic kidney disease with heart failure and stage 1 through stage 4 chronic kidney disease, or unspecified chronic kidney disease: Secondary | ICD-10-CM | POA: Diagnosis not present

## 2021-10-08 DIAGNOSIS — Z7901 Long term (current) use of anticoagulants: Secondary | ICD-10-CM | POA: Insufficient documentation

## 2021-10-08 DIAGNOSIS — I5022 Chronic systolic (congestive) heart failure: Secondary | ICD-10-CM | POA: Diagnosis present

## 2021-10-08 LAB — BASIC METABOLIC PANEL
Anion gap: 10 (ref 5–15)
BUN: 18 mg/dL (ref 6–20)
CO2: 25 mmol/L (ref 22–32)
Calcium: 9.4 mg/dL (ref 8.9–10.3)
Chloride: 102 mmol/L (ref 98–111)
Creatinine, Ser: 1.11 mg/dL — ABNORMAL HIGH (ref 0.44–1.00)
GFR, Estimated: 57 mL/min — ABNORMAL LOW (ref >60)
Glucose, Bld: 127 mg/dL — ABNORMAL HIGH (ref 70–99)
Potassium: 4.1 mmol/L (ref 3.5–5.1)
Sodium: 137 mmol/L (ref 135–145)

## 2021-10-08 MED ORDER — AMLODIPINE BESYLATE 5 MG PO TABS: 10.0000 mg | ORAL_TABLET | Freq: Every day | ORAL | 3 refills | Status: DC

## 2021-10-08 NOTE — Progress Notes (Signed)
PCP: Rosita Fire, MD Cardiology: Dr [name]  60 y.o. with history of HTN, SVT, and mild nonischemic cardiomopathy.  In 5/21, echo showed EF 45% with severe anteroseptal hypokinesis.  LHC in 5/21 showed normal coronaries.  She has LBBB at baseline.  She has been noted to have SVT in the past.    On 02/11/21, she went to the ER at [facility] with abdominal pain and distention.  She was noted to be in atrial tachycardia with rate around 150.  She was hypotensive and got IVF, was also transiently on norepinephrine.  HR dropped transiently to the 40s with junctional bradycardia per report. AKI with creatinine up to 2.21.  Echo in 3/22 showed EF 25-30%, moderate RV enlargement and moderately decreased RV systolic function with D-shaped septum, biatrial enlargement.  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  No bradycardia noted at [facility], and she remained in NSR on amiodarone.  EP thought that ablation of the atrial tachycardia would be difficult and recommended ongoing management with amiodarone.  LHC/RHC after diuresis showed normal filling pressures, preserved cardiac output, and no significant CAD.  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   Echo 9/22 showed EF up to 55-60% with mild LVH, normal RV, and PASP 21 mmHg.   Today she returns for HF follow up. Overall feeling fine. Denies increasing SOB, CP, dizziness, edema, or PND/Orthopnea. Appetite ok. No fever or chills. She does not weigh regularly at home. Taking all medications. She is a caregiver for her mother. BP at home averages 140s/60s. She has daytime sleepiness.  Labs (3/22): K 4.6, creatinine 1.7 Labs (4/22): ACE level normal, LFTs normal Labs (5/22): K 4.5, creatinine 0.85 Labs (8/22): K 4.5, creatinine 1.67 Labs (9/22): K 4.4, creatinine 1.05  Past Medical History: 1. Atrial tachycardia 2. HTN 3. Nonischemic cardiomyopathy: Echo in 5/21 with EF 45%,  severe anteroseptal HK.  - LHC (5/21): normal coronaries - Echo (3/22): EF 25-30%, moderate RV enlargement and moderately decreased RV systolic function with D-shaped septum, biatrial enlargement.  - RHC/LHC (3/22): No significant coronary disease; mean RA 1, PA 35/7, mean PCWP 4, CI 3.5 F, 2.8 T. - Cardiac MRI (3/22): Normal LV size with EF 20%, diffuse hypokinesis with septal-lateral dyssynchrony c/w LBBB. Normal RV size with EF 26%.  Difficult delayed enhancement imaging, however there does appear to be LGE in the septum that could be in a coronary disease-type pattern (<50% wall thickness subendocardial LGE in the mid-apical septal wall). - High resolution CT chest (4/22) with no evidence for pulmonary sarcoidosis or ILD.  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  - Echo (9/22): EF 55-60% with mild LVH, normal RV, and PASP 21 mmHg 4. Thyroid nodules: Benign on 2022 biopsy.   FH: Mother and niece with CHF.   Social History   Socioeconomic History   Marital status: Single    Spouse name: Not on file   Number of children: Not on file   Years of education: Not on file   Highest education level: Not on file  Occupational History   Not on file  Tobacco Use   Smoking status: Never   Smokeless tobacco: Never  Vaping Use   Vaping Use: Never used  Substance and Sexual Activity   Alcohol use: No    Alcohol/week: 0.0 standard drinks   Drug use: No   Sexual activity: Not Currently    Birth control/protection:  Post-menopausal  Other Topics Concern   Not on file  Social History Narrative   Not on file   Social Determinants of Health   Financial Resource Strain: Low Risk    Difficulty of Paying Living Expenses: Not hard at all  Food Insecurity: No Food Insecurity   Worried About Charity fundraiser in the Last Year: Never true   [place] in the Last Year: Never true  Transportation Needs: No Transportation Needs   Lack of  Transportation (Medical): No   Lack of Transportation (Non-Medical): No  Physical Activity: Sufficiently Active   Days of Exercise per Week: 7 days   Minutes of Exercise per Session: 30 min  Stress: No Stress Concern Present   Feeling of Stress : Not at all  Social Connections: Unknown   Frequency of Communication with Friends and Family: More than three times a week   Frequency of Social Gatherings with Friends and Family: Once a week   Attends Religious Services: 1 to 4 times per year   Active Member of [employer] or Organizations: No   Attends Music therapist: Never   Marital Status: Patient refused  Human resources officer Violence: Not At Risk   Fear of Current or Ex-Partner: No   Emotionally Abused: No   Physically Abused: No   Sexually Abused: No   ROS: All systems reviewed and negative except as per HPI.   Current Outpatient Medications  Medication Sig Dispense Refill   albuterol (VENTOLIN HFA) 108 (90 Base) MCG/ACT inhaler Inhale 1-2 puffs into the lungs every 4 (four) hours as needed.     amLODipine (NORVASC) 5 MG tablet Take 1 tablet (5 mg total) by mouth daily. 90 tablet 3   atorvastatin (LIPITOR) 80 MG tablet Take 1 tablet (80 mg total) by mouth daily. 90 tablet 3   dapagliflozin propanediol (FARXIGA) 10 MG TABS tablet TAKE 1 TABLET (10 MG TOTAL) BY MOUTH DAILY. 30 tablet 6   [START ON 10/17/2021] dronedarone (MULTAQ) 400 MG tablet Take 1 tablet (400 mg total) by mouth 2 (two) times daily with a meal. 60 tablet 3   isosorbide-hydrALAZINE (BIDIL) 20-37.5 MG tablet Take 2 tablets by mouth 3 (three) times daily. 180 tablet 5   sacubitril-valsartan (ENTRESTO) 97-103 MG Take 1 tablet by mouth 2 (two) times daily. 60 tablet 11   spironolactone (ALDACTONE) 25 MG tablet TAKE 1 TABLET (25 MG TOTAL) BY MOUTH DAILY. 30 tablet 6   No current facility-administered medications for this visit.   BP (!) 152/60   Pulse 62   Wt 75.2 kg (165 lb 12.8 oz)   LMP 05/23/2007   SpO2 96%    BMI 27.59 kg/m   Wt Readings from Last 3 Encounters:  10/08/21 75.2 kg (165 lb 12.8 oz)  09/25/21 76.2 kg (168 lb)  08/06/21 75.8 kg (167 lb 3.2 oz)   LMP 05/23/2007   General:  NAD. No resp difficulty HEENT: Normal Neck: Supple. No JVD. Carotids 2+ bilat; no bruits. No lymphadenopathy or thryomegaly appreciated. Cor: PMI nondisplaced. Regular rate & rhythm. No rubs, gallops or murmurs. Lungs: Clear Abdomen: Soft, nontender, nondistended. No hepatosplenomegaly. No bruits or masses. Good bowel sounds. Extremities: No cyanosis, clubbing, rash, edema Neuro: Alert & oriented x 3, cranial nerves grossly intact. Moves all 4 extremities w/o difficulty. Affect pleasant.  Assessment/Plan: 1. Chronic systolic CHF:  Nonischemic cardiomyopathy based on 5/21 cath, echo at that time showed EF 45%.  She was admitted 3/22 with volume overload and atrial  tachycardia with HR in 150s. Back in NSR with amiodarone. Echo in 3/22 with EF 25-30%, moderate RV enlargement and moderately decreased RV systolic function with D-shaped septum, biatrial enlargement.  CMRI with LV EF 20% RV EF 26%, technically difficult but there was delayed enhancement suggestive of possible coronary disease-type pattern, however LHC repeated in 3/22 showed no CAD. RHC showed low filling pressures and preserved CO.  Cause of cardiomyopathy uncertain, cannot rule out cardiac sarcoidosis as cause of MRI pattern though high resolution CT chest did not show evidence for pulmonary sarcoidosis.  Tachy-mediated component is certainly possible as well (from atrial tachycardia).  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  NYHA class II, she is not volume overloaded on exam.  - She has not needed Lasix.  - Continue Farxiga 10 mg daily.   - Continue  Bidil 2 tab tid.  - Continue spironolactone 25 mg daily.  - Continue Entresto 97/103 bid.  BMET today.  - Will not start carvedilol today with borderline HR of 62 and history of bradycardia. - She is now out of ICD range.   - With improvement in EF, think we can hold off on cardiac PET (? cardiac sarcoidosis).  2. Atrial tachycardia with RVR: 3/22 admission.  I suspect that this contributed to cardiomyopathy.  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 - Per EP, now off amiodarone and will start on Multaq in a couple weeks, with tentative plans for ablation. 3. CKD: Stage 3.  BMET today.  4. HTN: BP elevated today but SBP 140s  when she checks at home.   - Increase amlodipine to 10 mg daily. - Check BP daily at home and record, bring log to next appt.  - OSA may contribute to HTN. She does not have a smart phone she is comfortable using, so will arrange in-lab sleep study.  Followup in 3-4 months with Dr. [name].  [place] FNP 10/08/2021

## 2021-10-08 NOTE — Patient Instructions (Signed)
Labs were done today, if any labs are abnormal the clinic will call you  You have been referred for a sleep study their office will call you to setup appointment  INCREASE Amlodipine to 10 mg 2 tablets daily   Your physician recommends that you schedule a follow-up appointment in: 4 months this appointment has been scheduled for you  At the [facility], you and your health needs are our priority. As part of our continuing mission to provide you with exceptional heart care, we have created designated Provider Care Teams. These Care Teams include your primary Cardiologist (physician) and Advanced Practice Providers (APPs- Physician Assistants and Nurse Practitioners) who all work together to provide you with the care you need, when you need it.   You may see any of the following providers on your designated Care Team at your next follow up: Dr Glori Bickers Dr Haynes Kerns, NP Lyda Jester, [place] [facility], [place] Audry Riles, PharmD   Please be sure to bring in all your medications bottles to every appointment.   If you have any questions or concerns before your next appointment please send [place] a message through [place] or call our office at [phone].    TO LEAVE A MESSAGE FOR THE NURSE SELECT OPTION 2, PLEASE LEAVE A MESSAGE INCLUDING: YOUR NAME DATE OF BIRTH CALL BACK NUMBER REASON FOR CALL**this is important as we prioritize the call backs  YOU WILL RECEIVE A CALL BACK THE SAME DAY AS LONG AS YOU CALL BEFORE 4:00 PM

## 2021-10-15 ENCOUNTER — Other Ambulatory Visit (HOSPITAL_COMMUNITY): Payer: Self-pay | Admitting: *Deleted

## 2021-10-15 ENCOUNTER — Telehealth (HOSPITAL_COMMUNITY): Payer: Self-pay | Admitting: *Deleted

## 2021-10-15 NOTE — Telephone Encounter (Signed)
Pt left vm on pharmacy line requesting a refill I called pt back no answer.

## 2021-10-23 DIAGNOSIS — I10 Essential (primary) hypertension: Secondary | ICD-10-CM | POA: Diagnosis not present

## 2021-10-23 DIAGNOSIS — I5042 Chronic combined systolic (congestive) and diastolic (congestive) heart failure: Secondary | ICD-10-CM | POA: Diagnosis not present

## 2021-11-22 DIAGNOSIS — E1165 Type 2 diabetes mellitus with hyperglycemia: Secondary | ICD-10-CM | POA: Diagnosis not present

## 2021-11-22 DIAGNOSIS — I5042 Chronic combined systolic (congestive) and diastolic (congestive) heart failure: Secondary | ICD-10-CM | POA: Diagnosis not present

## 2021-12-07 ENCOUNTER — Other Ambulatory Visit (HOSPITAL_COMMUNITY): Payer: Self-pay | Admitting: Cardiology

## 2021-12-23 DIAGNOSIS — I5042 Chronic combined systolic (congestive) and diastolic (congestive) heart failure: Secondary | ICD-10-CM | POA: Diagnosis not present

## 2021-12-23 DIAGNOSIS — E1165 Type 2 diabetes mellitus with hyperglycemia: Secondary | ICD-10-CM | POA: Diagnosis not present

## 2022-01-14 ENCOUNTER — Other Ambulatory Visit (HOSPITAL_COMMUNITY): Payer: Self-pay | Admitting: Cardiology

## 2022-01-22 DIAGNOSIS — I10 Essential (primary) hypertension: Secondary | ICD-10-CM | POA: Diagnosis not present

## 2022-01-22 DIAGNOSIS — E1165 Type 2 diabetes mellitus with hyperglycemia: Secondary | ICD-10-CM | POA: Diagnosis not present

## 2022-01-30 DIAGNOSIS — I10 Essential (primary) hypertension: Secondary | ICD-10-CM | POA: Diagnosis not present

## 2022-01-30 DIAGNOSIS — Z1389 Encounter for screening for other disorder: Secondary | ICD-10-CM | POA: Diagnosis not present

## 2022-01-30 DIAGNOSIS — E1165 Type 2 diabetes mellitus with hyperglycemia: Secondary | ICD-10-CM | POA: Diagnosis not present

## 2022-01-30 DIAGNOSIS — Z23 Encounter for immunization: Secondary | ICD-10-CM | POA: Diagnosis not present

## 2022-01-30 DIAGNOSIS — Z0001 Encounter for general adult medical examination with abnormal findings: Secondary | ICD-10-CM | POA: Diagnosis not present

## 2022-01-30 DIAGNOSIS — E785 Hyperlipidemia, unspecified: Secondary | ICD-10-CM | POA: Diagnosis not present

## 2022-01-30 DIAGNOSIS — I5042 Chronic combined systolic (congestive) and diastolic (congestive) heart failure: Secondary | ICD-10-CM | POA: Diagnosis not present

## 2022-01-31 ENCOUNTER — Encounter: Payer: Self-pay | Admitting: Cardiology

## 2022-01-31 ENCOUNTER — Ambulatory Visit (INDEPENDENT_AMBULATORY_CARE_PROVIDER_SITE_OTHER): Payer: Medicare Other | Admitting: Cardiology

## 2022-01-31 ENCOUNTER — Other Ambulatory Visit: Payer: Self-pay

## 2022-01-31 VITALS — BP 130/54 | HR 61 | Ht 64.0 in | Wt 170.6 lb

## 2022-01-31 DIAGNOSIS — I471 Supraventricular tachycardia: Secondary | ICD-10-CM

## 2022-01-31 MED ORDER — DRONEDARONE HCL 400 MG PO TABS: 400.0000 mg | ORAL_TABLET | Freq: Two times a day (BID) | ORAL | 3 refills | Status: DC

## 2022-01-31 NOTE — Progress Notes (Signed)
? ?Electrophysiology Office Note ? ? ?Date:  01/31/2022  ? ?ID:  KLYN KROENING, DOB 1961/10/04, MRN [id number] ? ?PCP:  [name], MD  ?Cardiologist:  [name] ?Primary Electrophysiologist:  Andersen Mckiver Meredith Leeds, MD   ? ?Chief Complaint: SVT ?  ?History of Present Illness: ?Michelle Morrison is a 61 y.o. female who is being seen today for the evaluation of SVT at the request of [name], [name]*. Presenting today for electrophysiology evaluation. ? ?She has a history significant for hypertension, SVT, nonischemic cardiomyopathy.  May 2021 she had an echo that showed an ejection fraction of 45% with severe anteroseptal hypokinesis.  Left heart catheterization showed normal coronary arteries.  She has a left bundle branch block at baseline.  She has SVT as well. ? ?On 01/30/2021 she went to [facility] emergency room with abdominal pain and distention.  She was noted to be in atrial tachycardia with heart rates of 150 bpm.  She was hypotensive and got IV fluids and was transiently on norepinephrine.  Was dropped to the 40s with junctional bradycardia per report.  Her creatinine was elevated to 2.21.  Echo showed an ejection fraction of 25 to 30%.  She was transferred to [facility] and started on amiodarone.  Fortunately, she remained in sinus rhythm.  Repeat echo showed a normalization of her ejection fraction. ? ? ?Today, denies symptoms of palpitations, chest pain, shortness of breath, orthopnea, PND, lower extremity edema, claudication, dizziness, presyncope, syncope, bleeding, or neurologic sequela. The patient is tolerating medications without difficulties.  He feels well.  She has noted no further tachyarrhythmias.  She is happy with her control.  Unfortunately she is currently grieving the loss of her mother from December. ? ? ?Past Medical History:  ?Diagnosis Date  ? CHF (congestive heart failure) ([place])   ? a. EF 45% by echo in 03/2020 with normal cors by cath  ? Diabetes mellitus without  complication ([place])   ? Diarrhea   ? Herpes simplex infection   ? Hyperlipidemia   ? Hypertension   ? ?Past Surgical History:  ?Procedure Laterality Date  ? CESAREAN SECTION    ? CHOLECYSTECTOMY    ? COLONOSCOPY N/A 08/26/2014  ? Procedure: COLONOSCOPY;  Surgeon: [name], MD;  Location[facility];  Service: Endoscopy;  Laterality: N/A;  9:30 AM - moved to 8:30 - Ginger to notify pt  ? LEFT HEART CATH AND CORONARY ANGIOGRAPHY N/A 04/14/2020  ? Procedure: LEFT HEART CATH AND CORONARY ANGIOGRAPHY;  Surgeon: [name], [name], MD;  Location: [place] CV LAB;  Service: Cardiovascular;  Laterality: N/A;  ? RIGHT/LEFT HEART CATH AND CORONARY ANGIOGRAPHY N/A 02/19/2021  ? Procedure: RIGHT/LEFT HEART CATH AND CORONARY ANGIOGRAPHY;  Surgeon: [name], MD;  Location: [place] CV LAB;  Service: Cardiovascular;  Laterality: N/A;  ? ? ? ?Current Outpatient Medications  ?Medication Sig Dispense Refill  ? albuterol (VENTOLIN HFA) 108 (90 Base) MCG/ACT inhaler Inhale 1-2 puffs into the lungs every 4 (four) hours as needed.    ? amLODipine (NORVASC) 5 MG tablet Take 2 tablets (10 mg total) by mouth daily. 90 tablet 3  ? atorvastatin (LIPITOR) 80 MG tablet Take 1 tablet (80 mg total) by mouth daily. 90 tablet 3  ? dapagliflozin propanediol (FARXIGA) 10 MG TABS tablet TAKE 1 TABLET (10 MG TOTAL) BY MOUTH DAILY. 30 tablet 6  ? isosorbide-hydrALAZINE (BIDIL) 20-37.5 MG tablet TAKE 2 TABLETS BY MOUTH THREE TIMES DAILY 180 tablet 5  ?  sacubitril-valsartan (ENTRESTO) 97-103 MG Take 1 tablet by mouth 2 (two) times daily. 60 tablet 11  ? spironolactone (ALDACTONE) 25 MG tablet TAKE 1 TABLET(25 MG) BY MOUTH DAILY 30 tablet 6  ? dronedarone (MULTAQ) 400 MG tablet Take 1 tablet (400 mg total) by mouth 2 (two) times daily with a meal. 180 tablet 3  ? ?No current facility-administered medications for this visit.  ? ? ?Allergies:   Patient has no known allergies.  ? ?Social History:  The patient  reports that she has never  smoked. She has never used smokeless tobacco. She reports that she does not drink alcohol and does not use drugs.  ? ?Family History:  The patient's family history includes Diabetes in her mother; Hypertension in her mother.  ? ?ROS:  Please see the history of present illness.   Otherwise, review of systems is positive for none.   All other systems are reviewed and negative.  ? ?PHYSICAL EXAM: ?VS:  BP (!) 130/54   Pulse 61   Ht '5\' 4"'$  (1.626 m)   Wt 170 lb 9.6 oz (77.4 kg)   LMP 05/23/2007   SpO2 98%   BMI 29.28 kg/m?  , BMI Body mass index is 29.28 kg/m?. ?GEN: Well nourished, well developed, in no acute distress  ?HEENT: normal  ?Neck: no JVD, carotid bruits, or masses ?Cardiac: RRR; no murmurs, rubs, or gallops,no edema  ?Respiratory:  clear to auscultation bilaterally, normal work of breathing ?GI: soft, nontender, nondistended, + BS ?MS: no deformity or atrophy  ?Skin: warm and dry ?Neuro:  Strength and sensation are intact ?Psych: euthymic mood, full affect ? ?EKG:  EKG is ordered today. ?Personal review of the ekg ordered  shows junctional rhythm ? ?Recent Labs: ?02/11/2021: B Natriuretic Peptide 1,760.0 ?02/20/2021: Hemoglobin 10.8; Platelets 351 ?04/11/2021: Magnesium 1.9 ?08/06/2021: ALT 19; TSH 1.247 ?10/08/2021: BUN 18; Creatinine, Ser 1.11; Potassium 4.1; Sodium 137  ? ? ?Lipid Panel  ?   ?Component Value Date/Time  ? CHOL 172 04/14/2020 0903  ? TRIG 52 04/14/2020 0903  ? HDL 62 04/14/2020 0903  ? CHOLHDL 2.8 04/14/2020 0903  ? VLDL 10 04/14/2020 0903  ? LDLCALC 100 (H) 04/14/2020 4259  ? ? ? ?Wt Readings from Last 3 Encounters:  ?01/31/22 170 lb 9.6 oz (77.4 kg)  ?10/08/21 165 lb 12.8 oz (75.2 kg)  ?09/25/21 168 lb (76.2 kg)  ?  ? ? ?Other studies Reviewed: ?Additional studies/ records that were reviewed today include: TTE 08/06/21  ?Review of the above records today demonstrates:  ? 1. Left ventricular ejection fraction, by estimation, is 55 to 60%. The  ?left ventricle has normal function. The left  ventricle has no regional  ?wall motion abnormalities. There is mild left ventricular hypertrophy.  ?Left ventricular diastolic parameters  ?are consistent with Grade I diastolic dysfunction (impaired relaxation).  ? 2. Right ventricular systolic function is normal. The right ventricular  ?size is normal. There is normal pulmonary artery systolic pressure. The  ?estimated right ventricular systolic pressure is 56.3 mmHg.  ? 3. The mitral valve is normal in structure. No evidence of mitral valve  ?regurgitation. No evidence of mitral stenosis.  ? 4. The aortic valve is tricuspid. Aortic valve regurgitation is not  ?visualized. No aortic stenosis is present.  ? 5. The inferior vena cava is normal in size with greater than 50%  ?respiratory variability, suggesting right atrial pressure of 3 mmHg.  ? ?RHC/LHC 02/19/21 ?1. Low filling pressures.  ?2. Preserved cardiac output.  ?3.  No significant CAD.  ? ?ASSESSMENT AND PLAN: ? ?1.  Chronic systolic heart failure due to nonischemic cardiomyopathy: Recent echo shows an improvement in the ejection fraction 55 to 60%.  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  Currently on optimal medical therapy per the heart failure team.  We [place] continue with current management. ? ?2.  Atrial tachycardia: Currently on Multaq 400 mg twice daily.  She was previously on amiodarone but due to her age was switched to Multaq due to her young age and long-term side effects.  She has had no further arrhythmias.  She appears to be in a junctional rhythm today but she is asymptomatic.  We [place] continue with current management. ? ?3.  Hypertension: Currently well controlled ? ?Current medicines are reviewed at length with the patient today.   ?The patient does not have concerns regarding her medicines.  The following changes were made today: None ? ?Labs/ tests ordered today include:  ?Orders Placed This Encounter  ?Procedures  ? EKG 12-Lead  ? ? ? ?Disposition:   FU   6 months ? ?Signed, ?Danh Bayus Meredith Leeds, MD  ?01/31/2022 10:03 AM    ? ?[facility] ?[address] ?[address] ?[place] [address] ?[phone] (office) ?[phone] (fax) ? ?

## 2022-01-31 NOTE — Patient Instructions (Signed)
Medication Instructions:  Your physician recommends that you continue on your current medications as directed. Please refer to the Current Medication list given to you today.  *If you need a refill on your cardiac medications before your next appointment, please call your pharmacy*   Lab Work: None ordered   Testing/Procedures: None ordered   Follow-Up: At [facility], you and your health needs are our priority.  As part of our continuing mission to provide you with exceptional heart care, we have created designated Provider Care Teams.  These Care Teams include your primary Cardiologist (physician) and Advanced Practice Providers (APPs -  Physician Assistants and Nurse Practitioners) who all work together to provide you with the care you need, when you need it.  Your next appointment:   6 month(s)  The format for your next appointment:   In Person  Provider:   You will see one of the following Advanced Practice Providers on your designated Care Team:   Renee Ursuy, PA-C Michael "Andy" Tillery, PA-C   Thank you for choosing [facility]!!   Heidie Krall, RN [phone]         

## 2022-02-05 ENCOUNTER — Ambulatory Visit (HOSPITAL_COMMUNITY)
Admission: RE | Admit: 2022-02-05 | Discharge: 2022-02-05 | Disposition: A | Discharge status: Home / Self Care | Payer: Medicare Other | Source: Ambulatory Visit | Attending: Cardiology | Admitting: Cardiology

## 2022-02-05 ENCOUNTER — Encounter (HOSPITAL_COMMUNITY): Payer: Self-pay | Admitting: Cardiology

## 2022-02-05 ENCOUNTER — Other Ambulatory Visit: Payer: Self-pay

## 2022-02-05 VITALS — BP 140/80 | HR 61 | Wt 174.2 lb

## 2022-02-05 DIAGNOSIS — Z79899 Other long term (current) drug therapy: Secondary | ICD-10-CM | POA: Insufficient documentation

## 2022-02-05 DIAGNOSIS — I130 Hypertensive heart and chronic kidney disease with heart failure and stage 1 through stage 4 chronic kidney disease, or unspecified chronic kidney disease: Secondary | ICD-10-CM | POA: Insufficient documentation

## 2022-02-05 DIAGNOSIS — I471 Supraventricular tachycardia: Secondary | ICD-10-CM | POA: Insufficient documentation

## 2022-02-05 DIAGNOSIS — I447 Left bundle-branch block, unspecified: Secondary | ICD-10-CM | POA: Insufficient documentation

## 2022-02-05 DIAGNOSIS — R9431 Abnormal electrocardiogram [ECG] [EKG]: Secondary | ICD-10-CM | POA: Diagnosis not present

## 2022-02-05 DIAGNOSIS — R001 Bradycardia, unspecified: Secondary | ICD-10-CM | POA: Diagnosis not present

## 2022-02-05 DIAGNOSIS — I5022 Chronic systolic (congestive) heart failure: Secondary | ICD-10-CM | POA: Diagnosis not present

## 2022-02-05 DIAGNOSIS — I428 Other cardiomyopathies: Secondary | ICD-10-CM | POA: Diagnosis not present

## 2022-02-05 DIAGNOSIS — I5043 Acute on chronic combined systolic (congestive) and diastolic (congestive) heart failure: Secondary | ICD-10-CM

## 2022-02-05 DIAGNOSIS — N183 Chronic kidney disease, stage 3 unspecified: Secondary | ICD-10-CM | POA: Diagnosis not present

## 2022-02-05 DIAGNOSIS — Z7984 Long term (current) use of oral hypoglycemic drugs: Secondary | ICD-10-CM | POA: Diagnosis not present

## 2022-02-05 LAB — BASIC METABOLIC PANEL
Anion gap: 5 (ref 5–15)
BUN: 34 mg/dL — ABNORMAL HIGH (ref 6–20)
CO2: 25 mmol/L (ref 22–32)
Calcium: 9 mg/dL (ref 8.9–10.3)
Chloride: 108 mmol/L (ref 98–111)
Creatinine, Ser: 1.39 mg/dL — ABNORMAL HIGH (ref 0.44–1.00)
GFR, Estimated: 44 mL/min — ABNORMAL LOW (ref >60)
Glucose, Bld: 116 mg/dL — ABNORMAL HIGH (ref 70–99)
Potassium: 5.8 mmol/L — ABNORMAL HIGH (ref 3.5–5.1)
Sodium: 138 mmol/L (ref 135–145)

## 2022-02-05 NOTE — Patient Instructions (Signed)
Thank you for your visit today. ? ?There has been no changes to your medications. ? ?Labs done today, your results will be available in MyChart, we will contact you for abnormal readings. ? ?Your physician recommends that you schedule a follow-up appointment in: 4 months. ? ?If you have any questions or concerns before your next appointment please send us a message through mychart or call our office at [phone].   ? ?TO LEAVE A MESSAGE FOR THE NURSE SELECT OPTION 2, PLEASE LEAVE A MESSAGE INCLUDING: ?YOUR NAME ?DATE OF BIRTH ?CALL BACK NUMBER ?REASON FOR CALL**this is important as we prioritize the call backs ? ?YOU WILL RECEIVE A CALL BACK THE SAME DAY AS LONG AS YOU CALL BEFORE 4:00 PM ? ?At the [facility], you and your health needs are our priority. As part of our continuing mission to provide you with exceptional heart care, we have created designated Provider Care Teams. These Care Teams include your primary Cardiologist (physician) and Advanced Practice Providers (APPs- Physician Assistants and Nurse Practitioners) who all work together to provide you with the care you need, when you need it.  ? ?You may see any of the following providers on your designated Care Team at your next follow up: ?Dr Daniel Bensimhon ?Dr Dalton McLean ?Amy Clegg, NP ?Brittainy Simmons, PA ?Jessica Milford,NP ?Lindsay Finch, PA ?Lauren Kemp, PharmD ? ? ?Please be sure to bring in all your medications bottles to every appointment.  ? ? ?

## 2022-02-05 NOTE — Progress Notes (Signed)
PCP: [name], MD ?Cardiology: Dr [name] ? ?61 y.o. with history of HTN, SVT, and mild nonischemic cardiomopathy.  In 5/21, echo showed EF 45% with severe anteroseptal hypokinesis.  LHC in 5/21 showed normal coronaries.  She has LBBB at baseline.  She has been noted to have SVT in the past.   ? ?On 02/11/21, she went to the ER at [facility] with abdominal pain and distention.  She was noted to be in atrial tachycardia with rate around 150.  She was hypotensive and got IVF, was also transiently on norepinephrine.  HR dropped transiently to the 40s with junctional bradycardia per report. AKI with creatinine up to 2.21.  Echo in 3/22 showed EF 25-30%, moderate RV enlargement and moderately decreased RV systolic function with D-shaped septum, biatrial enlargement.  Concern for tachy-brady syndrome, she was transferred to [facility] for further evaluation.  No bradycardia noted at [facility], and she remained in NSR on amiodarone.  EP thought that ablation of the atrial tachycardia would be difficult and recommended ongoing management with amiodarone.  LHC/RHC after diuresis showed normal filling pressures, preserved cardiac output, and no significant CAD.  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  ? ?Echo in 9/22 showed EF up to 55-60% with mild LVH, normal RV, and PASP 21 mmHg.  ? ?Patient was seen by EP, amiodarone was stopped and she was started on dronedarone for atrial tachycardia.  Ablation was considered, but ultimately it was recommended to continue with dronedarone.  ? ?She returns for followup of CHF.  Weight up 9 lbs. BP elevated today, she says that SBP generally runs around 130. No significant exertional dyspnea.  No chest pain.  No lightheadedness.   ? ?ECG (personally reviewed): NSR, LBBB rate 49  ? ?Labs (3/22): K 4.6, creatinine 1.7 ?Labs (4/22): ACE level normal, LFTs normal ?Labs (5/22): K 4.5, creatinine 0.85 ?Labs (8/22): K 4.5, creatinine 1.67 ? ?Past  Medical History: ?1. Atrial tachycardia ?2. HTN ?3. Nonischemic cardiomyopathy: Echo in 5/21 with EF 45%, severe anteroseptal HK.  ?- LHC (5/21): normal coronaries ?- Echo (3/22): EF 25-30%, moderate RV enlargement and moderately decreased RV systolic function with D-shaped septum, biatrial enlargement.  ?- RHC/LHC (3/22): No significant coronary disease; mean RA 1, PA 35/7, mean PCWP 4, CI 3.5 F, 2.8 T. ?- Cardiac MRI (3/22): Normal LV size with EF 20%, diffuse hypokinesis with septal-lateral dyssynchrony c/w LBBB. Normal RV size with EF 26%.  Difficult delayed enhancement imaging, however there does appear to be LGE in the septum that could be in a coronary disease-type pattern (<50% wall thickness subendocardial LGE in the mid-apical septal wall). ?- High resolution CT chest (4/22) with no evidence for pulmonary sarcoidosis or ILD.  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  ?- Echo (9/22): EF 55-60% with mild LVH, normal RV, and PASP 21 mmHg ?4. Thyroid nodules: Benign on 2022 biopsy.  ? ?FH: Mother and niece with CHF.  ? ?Social History  ? ?Socioeconomic History  ? Marital status: Single  ?  Spouse name: Not on file  ? Number of children: Not on file  ? Years of education: Not on file  ? Highest education level: Not on file  ?Occupational History  ? Not on file  ?Tobacco Use  ? Smoking status: Never  ? Smokeless tobacco: Never  ?Vaping Use  ? Vaping Use: Never used  ?Substance and Sexual Activity  ? Alcohol use: No  ?  Alcohol/week: 0.0 standard drinks  ? Drug use: No  ? Sexual activity: Not Currently  ?  Birth control/protection: Post-menopausal  ?Other Topics Concern  ? Not on file  ?Social History Narrative  ? Not on file  ? ?Social Determinants of Health  ? ?Financial Resource Strain: Low Risk   ? Difficulty of Paying Living Expenses: Not hard at all  ?Food Insecurity: No Food Insecurity  ? Worried About Charity fundraiser in the Last Year: Never true  ? Ran Out of  Food in the Last Year: Never true  ?Transportation Needs: No Transportation Needs  ? Lack of Transportation (Medical): No  ? Lack of Transportation (Non-Medical): No  ?Physical Activity: Sufficiently Active  ? Days of Exercise per Week: 7 days  ? Minutes of Exercise per Session: 30 min  ?Stress: No Stress Concern Present  ? Feeling of Stress : Not at all  ?Social Connections: Unknown  ? Frequency of Communication with Friends and Family: More than three times a week  ? Frequency of Social Gatherings with Friends and Family: Once a week  ? Attends Religious Services: 1 to 4 times per year  ? Active Member of Clubs or Organizations: No  ? Attends Archivist Meetings: Never  ? Marital Status: Patient refused  ?Intimate Partner Violence: Not At Risk  ? Fear of Current or Ex-Partner: No  ? Emotionally Abused: No  ? Physically Abused: No  ? Sexually Abused: No  ? ?ROS: All systems reviewed and negative except as per HPI.  ? ?Current Outpatient Medications  ?Medication Sig Dispense Refill  ? albuterol (VENTOLIN HFA) 108 (90 Base) MCG/ACT inhaler Inhale 1-2 puffs into the lungs every 4 (four) hours as needed.    ? amLODipine (NORVASC) 5 MG tablet Take 2 tablets (10 mg total) by mouth daily. 90 tablet 3  ? atorvastatin (LIPITOR) 80 MG tablet Take 1 tablet (80 mg total) by mouth daily. 90 tablet 3  ? dapagliflozin propanediol (FARXIGA) 10 MG TABS tablet TAKE 1 TABLET (10 MG TOTAL) BY MOUTH DAILY. 30 tablet 6  ? dronedarone (MULTAQ) 400 MG tablet Take 1 tablet (400 mg total) by mouth 2 (two) times daily with a meal. 180 tablet 3  ? isosorbide-hydrALAZINE (BIDIL) 20-37.5 MG tablet TAKE 2 TABLETS BY MOUTH THREE TIMES DAILY 180 tablet 5  ? sacubitril-valsartan (ENTRESTO) 97-103 MG Take 1 tablet by mouth 2 (two) times daily. 60 tablet 11  ? spironolactone (ALDACTONE) 25 MG tablet TAKE 1 TABLET(25 MG) BY MOUTH DAILY 30 tablet 6  ? ?No current facility-administered medications for this encounter.  ? ?BP 140/80   Pulse 61    Wt 79 kg (174 lb 3.2 oz)   LMP 05/23/2007   SpO2 97%   BMI 29.90 kg/m?  ?General: NAD ?Neck: No JVD, no thyromegaly or thyroid nodule.  ?Lungs: Clear to auscultation bilaterally with normal respiratory effort. ?CV: Nondisplaced PMI.  Heart regular S1/S2, no S3/S4, 2/6 early SEM RUSB.  No peripheral edema.  No carotid bruit.  Normal pedal pulses.  ?Abdomen: Soft, nontender, no hepatosplenomegaly, no distention.  ?Skin: Intact without lesions or rashes.  ?Neurologic: Alert and oriented x 3.  ?Psych: Normal affect. ?Extremities: No clubbing or cyanosis.  ?HEENT: Normal.  ? ?Assessment/Plan: ?1. Chronic systolic CHF:  Nonischemic cardiomyopathy based on 5/21 cath, echo at that time showed EF 45%.  She was admitted 3/22 with volume overload and atrial tachycardia with HR in 150s. Back in NSR with amiodarone. Echo in 3/22 with EF  25-30%, moderate RV enlargement and moderately decreased RV systolic function with D-shaped septum, biatrial enlargement.  CMRI with LV EF 20% RV EF 26%, technically difficult but there was delayed enhancement suggestive of possible coronary disease-type pattern, however LHC repeated in 3/22 showed no CAD. RHC showed low filling pressures and preserved CO.  Cause of cardiomyopathy uncertain, cannot rule out cardiac sarcoidosis as cause of MRI pattern though high resolution CT chest did not show evidence for pulmonary sarcoidosis.  Tachy-mediated component is certainly possible as well (from atrial tachycardia).  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  NYHA class I-II, she is not volume overloaded on exam.  ?- She has not needed Lasix.  ?- Continue Farxiga 10 mg daily.   ?- Continue Bidil 2 tab tid.  ?- Continue spironolactone 25 mg daily. BMET today.  ?- Continue Entresto 97/103 bid.    ?- With mild bradycardia (rate 49 on today's ECG), I have not started her on Coreg.  ?- She is now out of ICD range.   ?- With improvement in EF, think we can hold off on cardiac PET (?cardiac sarcoidosis

## 2022-02-06 ENCOUNTER — Telehealth (HOSPITAL_COMMUNITY): Payer: Self-pay

## 2022-02-06 DIAGNOSIS — I5042 Chronic combined systolic (congestive) and diastolic (congestive) heart failure: Secondary | ICD-10-CM

## 2022-02-06 NOTE — Telephone Encounter (Addendum)
Pt aware, agreeable, and verbalized understanding ?  ? ?----- Message from [name], MD sent at 02/05/2022 11:25 PM EDT ----- ?Hold spironolactone for 3 days, low K diet, restart spironolactone at 12.5 mg daily with BMET in 1 week.  ?

## 2022-02-15 ENCOUNTER — Other Ambulatory Visit (HOSPITAL_COMMUNITY)
Admission: RE | Admit: 2022-02-15 | Discharge: 2022-02-15 | Disposition: A | Discharge status: Home / Self Care | Payer: Medicare Other | Source: Ambulatory Visit | Attending: Cardiology | Admitting: Cardiology

## 2022-02-15 DIAGNOSIS — I5042 Chronic combined systolic (congestive) and diastolic (congestive) heart failure: Secondary | ICD-10-CM | POA: Insufficient documentation

## 2022-02-15 LAB — BASIC METABOLIC PANEL
Anion gap: 5 (ref 5–15)
BUN: 31 mg/dL — ABNORMAL HIGH (ref 6–20)
CO2: 26 mmol/L (ref 22–32)
Calcium: 9.2 mg/dL (ref 8.9–10.3)
Chloride: 108 mmol/L (ref 98–111)
Creatinine, Ser: 1.35 mg/dL — ABNORMAL HIGH (ref 0.44–1.00)
GFR, Estimated: 45 mL/min — ABNORMAL LOW (ref >60)
Glucose, Bld: 76 mg/dL (ref 70–99)
Potassium: 5.6 mmol/L — ABNORMAL HIGH (ref 3.5–5.1)
Sodium: 139 mmol/L (ref 135–145)

## 2022-02-18 ENCOUNTER — Other Ambulatory Visit (HOSPITAL_COMMUNITY): Payer: Self-pay | Admitting: *Deleted

## 2022-03-02 DIAGNOSIS — I10 Essential (primary) hypertension: Secondary | ICD-10-CM | POA: Diagnosis not present

## 2022-03-02 DIAGNOSIS — E1165 Type 2 diabetes mellitus with hyperglycemia: Secondary | ICD-10-CM | POA: Diagnosis not present

## 2022-03-04 ENCOUNTER — Other Ambulatory Visit: Payer: Medicare Other | Admitting: Obstetrics & Gynecology

## 2022-04-01 ENCOUNTER — Ambulatory Visit (INDEPENDENT_AMBULATORY_CARE_PROVIDER_SITE_OTHER): Payer: Medicare Other | Admitting: Obstetrics & Gynecology

## 2022-04-01 ENCOUNTER — Other Ambulatory Visit (HOSPITAL_COMMUNITY)
Admission: RE | Admit: 2022-04-01 | Discharge: 2022-04-01 | Disposition: A | Discharge status: Home / Self Care | Payer: Medicare Other | Source: Ambulatory Visit | Attending: Obstetrics & Gynecology | Admitting: Obstetrics & Gynecology

## 2022-04-01 ENCOUNTER — Encounter: Payer: Self-pay | Admitting: Obstetrics & Gynecology

## 2022-04-01 VITALS — BP 130/58 | HR 62 | Ht 65.0 in | Wt 174.0 lb

## 2022-04-01 DIAGNOSIS — Z124 Encounter for screening for malignant neoplasm of cervix: Secondary | ICD-10-CM | POA: Insufficient documentation

## 2022-04-01 DIAGNOSIS — E1165 Type 2 diabetes mellitus with hyperglycemia: Secondary | ICD-10-CM | POA: Diagnosis not present

## 2022-04-01 DIAGNOSIS — Z113 Encounter for screening for infections with a predominantly sexual mode of transmission: Secondary | ICD-10-CM | POA: Insufficient documentation

## 2022-04-01 DIAGNOSIS — Z1151 Encounter for screening for human papillomavirus (HPV): Secondary | ICD-10-CM | POA: Diagnosis not present

## 2022-04-01 DIAGNOSIS — E113412 Type 2 diabetes mellitus with severe nonproliferative diabetic retinopathy with macular edema, left eye: Secondary | ICD-10-CM | POA: Diagnosis not present

## 2022-04-01 DIAGNOSIS — Z01419 Encounter for gynecological examination (general) (routine) without abnormal findings: Secondary | ICD-10-CM

## 2022-04-01 DIAGNOSIS — Z1211 Encounter for screening for malignant neoplasm of colon: Secondary | ICD-10-CM | POA: Insufficient documentation

## 2022-04-01 DIAGNOSIS — I10 Essential (primary) hypertension: Secondary | ICD-10-CM | POA: Diagnosis not present

## 2022-04-01 LAB — HEMOCCULT GUIAC POC 1CARD (OFFICE): Fecal Occult Blood, POC: NEGATIVE

## 2022-04-01 NOTE — Progress Notes (Signed)
Subjective:  ?  ? Michelle Morrison is a 61 y.o. female here for a routine exam.  Patient's last menstrual period was 05/23/2007. [id number] ?Birth Control Method:  menopausal ?Menstrual Calendar(currently): amenorrheic  ?Current complaints: none.  ? ?Current acute medical issues:  none ?  ?Recent Gynecologic History ?Patient's last menstrual period was 05/23/2007. ?Last Pap: 2022,  LSIL ?Last mammogram: 1/22,  normal ? ?Past Medical History:  ?Diagnosis Date  ? CHF (congestive heart failure) ([place])   ? a. EF 45% by echo in 03/2020 with normal cors by cath  ? Diabetes mellitus without complication ([place])   ? Diarrhea   ? Herpes simplex infection   ? Hyperlipidemia   ? Hypertension   ? ? ?Past Surgical History:  ?Procedure Laterality Date  ? CESAREAN SECTION    ? CHOLECYSTECTOMY    ? COLONOSCOPY N/A 08/26/2014  ? Procedure: COLONOSCOPY;  Surgeon: [name], MD;  [facility];  Service: Endoscopy;  Laterality: N/A;  9:30 AM - moved to 8:30 - [place] to notify pt  ? LEFT HEART CATH AND CORONARY ANGIOGRAPHY N/A 04/14/2020  ? Procedure: LEFT HEART CATH AND CORONARY ANGIOGRAPHY;  Surgeon: [name], [name], MD;  Location: [facility];  Service: Cardiovascular;  Laterality: N/A;  ? RIGHT/LEFT HEART CATH AND CORONARY ANGIOGRAPHY N/A 02/19/2021  ? Procedure: RIGHT/LEFT HEART CATH AND CORONARY ANGIOGRAPHY;  Surgeon: [name], MD;  Location: [facility];  Service: Cardiovascular;  Laterality: N/A;  ? ? ?OB History   ? ? Gravida  ?2  ? Para  ?2  ? Term  ?1  ? Preterm  ?1  ? AB  ?   ? Living  ?2  ?  ? ? SAB  ?   ? IAB  ?   ? Ectopic  ?   ? Multiple  ?   ? Live Births  ?2  ?   ?  ?  ? ? ?Social History  ? ?Socioeconomic History  ? Marital status: Single  ?  Spouse name: Not on file  ? Number of children: Not on file  ? Years of education: Not on file  ? Highest education level: Not on file  ?Occupational History  ? Not on file  ?Tobacco Use  ? Smoking status: Never  ? Smokeless tobacco: Never  ?Vaping Use   ? Vaping Use: Never used  ?Substance and Sexual Activity  ? Alcohol use: No  ?  Alcohol/week: 0.0 standard drinks  ? Drug use: No  ? Sexual activity: Not Currently  ?  Birth control/protection: Post-menopausal  ?Other Topics Concern  ? Not on file  ?Social History Narrative  ? Not on file  ? ?Social Determinants of Health  ? ?Financial Resource Strain: Not on file  ?Food Insecurity: Not on file  ?Transportation Needs: Not on file  ?Physical Activity: Not on file  ?Stress: Not on file  ?Social Connections: Not on file  ? ? ?Family History  ?Problem Relation Age of Onset  ? Hypertension Mother   ? Diabetes Mother   ? Colon cancer Neg Hx   ? ? ? ?Current Outpatient Medications:  ?  albuterol (VENTOLIN HFA) 108 (90 Base) MCG/ACT inhaler, Inhale 1-2 puffs into the lungs every 4 (four) hours as needed., Disp: , Rfl:  ?  amiodarone (PACERONE) 200 MG tablet, Take 200 mg by mouth daily., Disp: , Rfl:  ?  amLODipine (NORVASC) 5 MG tablet, Take 2 tablets (10 mg total) by mouth daily.,  Disp: 90 tablet, Rfl: 3 ?  atorvastatin (LIPITOR) 80 MG tablet, Take 1 tablet (80 mg total) by mouth daily., Disp: 90 tablet, Rfl: 3 ?  dapagliflozin propanediol (FARXIGA) 10 MG TABS tablet, TAKE 1 TABLET (10 MG TOTAL) BY MOUTH DAILY., Disp: 30 tablet, Rfl: 6 ?  dronedarone (MULTAQ) 400 MG tablet, Take 1 tablet (400 mg total) by mouth 2 (two) times daily with a meal., Disp: 180 tablet, Rfl: 3 ?  isosorbide-hydrALAZINE (BIDIL) 20-37.5 MG tablet, TAKE 2 TABLETS BY MOUTH THREE TIMES DAILY, Disp: 180 tablet, Rfl: 5 ?  sacubitril-valsartan (ENTRESTO) 97-103 MG, Take 1 tablet by mouth 2 (two) times daily., Disp: 60 tablet, Rfl: 11 ? ?Review of Systems ? ?Review of Systems  ?Constitutional: Negative for fever, chills, weight loss, malaise/fatigue and diaphoresis.  ?HENT: Negative for hearing loss, ear pain, nosebleeds, congestion, sore throat, neck pain, tinnitus and ear discharge.   ?Eyes: Negative for blurred vision, double vision, photophobia, pain,  discharge and redness.  ?Respiratory: Negative for cough, hemoptysis, sputum production, shortness of breath, wheezing and stridor.   ?Cardiovascular: Negative for chest pain, palpitations, orthopnea, claudication, leg swelling and PND.  ?Gastrointestinal: negative for abdominal pain. Negative for heartburn, nausea, vomiting, diarrhea, constipation, blood in stool and melena.  ?Genitourinary: Negative for dysuria, urgency, frequency, hematuria and flank pain.  ?Musculoskeletal: Negative for myalgias, back pain, joint pain and falls.  ?Skin: Negative for itching and rash.  ?Neurological: Negative for dizziness, tingling, tremors, sensory change, speech change, focal weakness, seizures, loss of consciousness, weakness and headaches.  ?Endo/Heme/Allergies: Negative for environmental allergies and polydipsia. Does not bruise/bleed easily.  ?Psychiatric/Behavioral: Negative for depression, suicidal ideas, hallucinations, memory loss and substance abuse. The patient is not nervous/anxious and does not have insomnia.   ? ?  ?  ?Objective:  ?Blood pressure (!) 130/58, pulse 62, height '5\' 5"'$  (1.651 m), weight 174 lb (78.9 kg), last menstrual period 05/23/2007.  ? Physical Exam  ?Vitals reviewed. ?Constitutional: She is oriented to person, place, and time. She appears well-developed and well-nourished.  ?HENT:  ?Head: Normocephalic and atraumatic.        ?Right Ear: External ear normal.  ?Left Ear: External ear normal.  ?Nose: Nose normal.  ?Mouth/Throat: Oropharynx is clear and moist.  ?Eyes: Conjunctivae and EOM are normal. Pupils are equal, round, and reactive to light. Right eye exhibits no discharge. Left eye exhibits no discharge. No scleral icterus.  ?Neck: Normal range of motion. Neck supple. No tracheal deviation present. No thyromegaly present.  ?Cardiovascular: Normal rate, regular rhythm, normal heart sounds and intact distal pulses.  Exam reveals no gallop and no friction rub.   ?No murmur heard. ?Respiratory:  Effort normal and breath sounds normal. No respiratory distress. She has no wheezes. She has no rales. She exhibits no tenderness.  ?GI: Soft. Bowel sounds are normal. She exhibits no distension and no mass. There is no tenderness. There is no rebound and no guarding.  ?Genitourinary:  ?Breasts no masses skin changes or nipple changes bilaterally ?     Vulva is normal without lesions ?Vagina is pink moist without discharge ?Cervix normal in appearance and pap is done ?Uterus is normal size shape and contour ?Adnexa is negative with normal sized ovaries  ? ?Musculoskeletal: Normal range of motion. She exhibits no edema and no tenderness.  ?Neurological: She is alert and oriented to person, place, and time. She has normal reflexes. She displays normal reflexes. No cranial nerve deficit. She exhibits normal muscle tone. Coordination normal.  ?Skin: Skin  is warm and dry. No rash noted. No erythema. No pallor.  ?Psychiatric: She has a normal mood and affect. Her behavior is normal. Judgment and thought content normal.  ? ?   ? ?Medications Ordered at today's visit: ?No orders of the defined types were placed in this encounter. ? ? ?Other orders placed at today's visit: ?Orders Placed This Encounter  ?Procedures  ? MM 3D SCREEN BREAST BILATERAL  ? ? ? ? ?Assessment:  ? ? Normal Gyn exam.  ? Hx of LSIL cytology in past ?Hx of trichomonas multiple times ?Plan:  ? ? Contraception: post menopausal status. ?Mammogram ordered. ?Follow up in: 1 year.   ? ? ?Return in about 1 year (around 04/02/2023) for yearly. ? ?

## 2022-04-01 NOTE — Addendum Note (Signed)
Addended by: Octaviano Glow on: 04/01/2022 12:12 PM ? ? Modules accepted: Orders ? ?

## 2022-04-02 LAB — CERVICOVAGINAL ANCILLARY ONLY
Chlamydia: POSITIVE — AB
Comment: NEGATIVE
Comment: NEGATIVE
Comment: NORMAL
Neisseria Gonorrhea: NEGATIVE
Trichomonas: NEGATIVE

## 2022-04-04 NOTE — Progress Notes (Signed)
?[facility] Note ? ?04/10/2022 ? ?  ? ?CHIEF COMPLAINT ?Patient presents for Retina Evaluation ? ? ?HISTORY OF PRESENT ILLNESS: ?Michelle Morrison is a 61 y.o. female who presents to the clinic today for:  ? ?HPI   ? ? Retina Evaluation   ?In left eye.  This started 4 weeks ago.  Duration of 4 weeks.  I, the attending physician,  performed the HPI with the patient and updated documentation appropriately. ? ?  ?  ? ? Comments   ?Pt here for ret eval mac edema OS. Pt referred by Dr. Jorja Loa in [place]. Pt states she has noticed blurry vision in OS on and off for some weeks. Pt states OD VA seems stable, no issues. Borderline diabetic. Pt denies being on medication for diabetes, she does report being hypertensive, has been medicated a long time for that. She reports last A1C was around 6, but it has been a long time since shes had it checked. Pt does not wear rx specs just OTC readers.  ? ?  ?  ?Last edited by [name], MD on 04/10/2022 11:35 AM.  ?  ?Pt is here on the referral of Dr. 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 ?Referring physician: ?Madelin Headings, DO ?[address] ?[place],  [place] [address] ? ?HISTORICAL INFORMATION:  ? ?Selected notes from the [facility] ?Referred by Dr. Jorja Loa ?LEE: 05.08.23 ?Ocular Hx- NPDR OS ? ?Patient saw Dr. [name] Feb 2022.  Vision at that time was OD 20/20, OS 20/25 ?  ? ?CURRENT MEDICATIONS: ?No current outpatient medications on file. (Ophthalmic Drugs)  ? ?No current facility-administered medications for this visit. (Ophthalmic Drugs)  ? ?Current Outpatient Medications (Other)  ?Medication Sig  ? albuterol (VENTOLIN HFA) 108 (90 Base) MCG/ACT inhaler Inhale 1-2 puffs into the lungs every 4 (four) hours as needed.  ? amLODipine (NORVASC) 5 MG  tablet Take 2 tablets (10 mg total) by mouth daily.  ? atorvastatin (LIPITOR) 80 MG tablet Take 1 tablet (80 mg total) by mouth daily.  ? dapagliflozin propanediol (FARXIGA) 10 MG TABS tablet TAKE 1 TABLET (10 MG TOTAL) BY MOUTH DAILY.  ? dronedarone (MULTAQ) 400 MG tablet Take 1 tablet (400 mg total) by mouth 2 (two) times daily with a meal.  ? isosorbide-hydrALAZINE (BIDIL) 20-37.5 MG tablet TAKE 2 TABLETS BY MOUTH THREE TIMES DAILY  ? sacubitril-valsartan (ENTRESTO) 97-103 MG Take 1 tablet by mouth 2 (two) times daily.  ? amiodarone (PACERONE) 200 MG tablet Take 200 mg by mouth daily. (Patient not taking: Reported on 04/10/2022)  ? ?No current facility-administered medications for this visit. (Other)  ? ?REVIEW OF SYSTEMS: ?ROS   ?Positive for: Endocrine, Cardiovascular, Eyes, Respiratory ?Negative for: Constitutional, Gastrointestinal, Neurological, Skin, Genitourinary, Musculoskeletal, HENT, Psychiatric, Allergic/Imm, Heme/Lymph ?Last edited by Kingsley Spittle, COT on 04/10/2022  9:18 AM.  ?  ? ?ALLERGIES ?No Known Allergies ? ?PAST MEDICAL HISTORY ?Past Medical History:  ?Diagnosis Date  ? CHF (congestive heart failure) ([place])   ? a. EF 45% by echo in 03/2020 with normal cors by cath  ? Diabetes mellitus without complication ([place])   ? Diarrhea   ? Heart disease   ? Herpes simplex infection   ?  Hyperlipidemia   ? Hypertension   ? ?Past Surgical History:  ?Procedure Laterality Date  ? CESAREAN SECTION    ? CHOLECYSTECTOMY    ? COLONOSCOPY N/A 08/26/2014  ? Procedure: COLONOSCOPY;  Surgeon: [name], MD;  [facility];  Service: Endoscopy;  Laterality: N/A;  9:30 AM - moved to 8:30 - Ginger to notify pt  ? LEFT HEART CATH AND CORONARY ANGIOGRAPHY N/A 04/14/2020  ? Procedure: LEFT HEART CATH AND CORONARY ANGIOGRAPHY;  Surgeon: [name], [name], MD;  Location: [facility];  Service: Cardiovascular;  Laterality: N/A;  ? RIGHT/LEFT HEART CATH AND CORONARY ANGIOGRAPHY N/A 02/19/2021  ? Procedure:  RIGHT/LEFT HEART CATH AND CORONARY ANGIOGRAPHY;  Surgeon: [name], MD;  Location: [facility];  Service: Cardiovascular;  Laterality: N/A;  ? ?FAMILY HISTORY ?Family History  ?Problem Relation Age of Onset  ? Hypertension Mother   ? Diabetes Mother   ? Colon cancer Neg Hx   ? ?SOCIAL HISTORY ?Social History  ? ?Tobacco Use  ? Smoking status: Never  ? Smokeless tobacco: Never  ?Vaping Use  ? Vaping Use: Never used  ?Substance Use Topics  ? Alcohol use: No  ?  Alcohol/week: 0.0 standard drinks  ? Drug use: No  ?  ? ?  ?OPHTHALMIC EXAM: ? ?Base Eye Exam   ? ? Visual Acuity (Snellen - Linear)   ? ?   Right Left  ? Dist [place] 20/40 20/100 -2  ? Dist ph [place] 20/20 -2 NI  ? ?  ?  ? ? Tonometry (Tonopen, 9:41 AM)   ? ?   Right Left  ? Pressure 12 15  ? ?  ?  ? ? Pupils   ? ?   Dark Light Shape React APD  ? Right 2 1 Round Brisk None  ? Left 2 1 Round Brisk None  ? ?  ?  ? ? Visual Fields   ? ?   Left Right  ?  Full Full  ? ?  ?  ? ? Extraocular Movement   ? ?   Right Left  ?  Full, Ortho Full, Ortho  ? ?  ?  ? ? Neuro/Psych   ? ? Oriented x3: Yes  ? Mood/Affect: Normal  ? ?  ?  ? ? Dilation   ? ? Both eyes: 1.0% Mydriacyl, 2.5% Phenylephrine @ 9:42 AM  ? ?  ?  ? ?  ? ?Slit Lamp and Fundus Exam   ? ? Slit Lamp Exam   ? ?   Right Left  ? Lids/Lashes Dermatochalasis - upper lid, Meibomian gland dysfunction Dermatochalasis - upper lid, Meibomian gland dysfunction  ? Conjunctiva/Sclera melanosis melanosis  ? Cornea trace PEE trace PEE  ? Anterior Chamber deep, narrow temporal angle deep, narrow temporal angle  ? Iris Round and dilated, No NVI Round and dilated, No NVI  ? Lens 2+ Nuclear sclerosis, 2+ Cortical cataract 2+ Nuclear sclerosis, 2+ Cortical cataract  ? Anterior Vitreous Vitreous syneresis Vitreous syneresis  ? ?  ?  ? ? Fundus Exam   ? ?   Right Left  ? Disc Pink and sharp, PPP Pink and sharp, PPP  ? C/D Ratio 0.6 0.6  ? Macula Flat, Good foveal reflex, No heme or edema Blunted foveal reflex, central edema,  perifoveal exudates, cluster of IRH superior macula  ? Vessels Attenuated, Tortuous, mild Copper wiring Attenuated, Tortuous, Copper wiring, old BRVO with macroaneurysm superior macula  ? Periphery  Attached, rare MA Attached, rare MA  ? ?  ?  ? ?  ? ?Refraction   ? ? Manifest Refraction   ? ?   Sphere Cylinder Axis Dist VA  ? Right -2.25 +2.50 180 20/20-2  ? Left -1.50 +0.75 030 20/100-2  ? ?  ?  ? ?  ? ?IMAGING AND PROCEDURES  ?Imaging and Procedures for 04/10/2022 ? ?OCT, Retina - OU - Both Eyes   ? ?   ?Right Eye ?Quality was good. Central Foveal Thickness: 227. Progression has no prior data. Findings include normal foveal contour, no IRF, no SRF, vitreomacular adhesion .  ? ?Left Eye ?Quality was good. Central Foveal Thickness: 354. Progression has no prior data. Findings include abnormal foveal contour, intraretinal fluid, no SRF, subretinal hyper-reflective material, intraretinal hyper-reflective material, vitreomacular adhesion (Severe central edema ).  ? ?Notes ?*Images captured and stored on drive ? ?Diagnosis / Impression:  ?OD: NFP; no IRF/SRF  ?OS: Severe central edema w/ +IRF, IRHM, and SRHM -- BRVO w/ CME ? ?Clinical management:  ?See below ? ?Abbreviations: NFP - Normal foveal profile. CME - cystoid macular edema. PED - pigment epithelial detachment. IRF - intraretinal fluid. SRF - subretinal fluid. EZ - ellipsoid zone. ERM - epiretinal membrane. ORA - outer retinal atrophy. ORT - outer retinal tubulation. SRHM - subretinal hyper-reflective material. IRHM - intraretinal hyper-reflective material  ? ?  ? ?Fluorescein Angiography Optos (Transit OS)   ? ?   ?Right Eye ?Progression has no prior data. Early phase findings include microaneurysm (Mild scattered MA). Mid/Late phase findings include microaneurysm (Mild Scattered MA, no significant leakage).  ? ?Left Eye ?Progression has no prior data. Early phase findings include microaneurysm, blockage. Mid/Late phase findings include leakage, microaneurysm  (Focal cluster of leakage superior mac and perifovea).  ? ?Notes ?**Images stored on drive** ? ?Impression: ?OD: Mild Scattered MA, no leakage or NV ?OS: Focal cluster of leakage superior mac and peri

## 2022-04-05 LAB — CYTOLOGY - PAP
Comment: NEGATIVE
Comment: NEGATIVE
Comment: NEGATIVE
Diagnosis: UNDETERMINED — AB
HPV 16: NEGATIVE
HPV 18 / 45: NEGATIVE
High risk HPV: POSITIVE — AB

## 2022-04-10 ENCOUNTER — Encounter (INDEPENDENT_AMBULATORY_CARE_PROVIDER_SITE_OTHER): Payer: Self-pay | Admitting: Ophthalmology

## 2022-04-10 ENCOUNTER — Ambulatory Visit (INDEPENDENT_AMBULATORY_CARE_PROVIDER_SITE_OTHER): Payer: Medicare Other | Admitting: Ophthalmology

## 2022-04-10 DIAGNOSIS — I10 Essential (primary) hypertension: Secondary | ICD-10-CM | POA: Diagnosis not present

## 2022-04-10 DIAGNOSIS — E113293 Type 2 diabetes mellitus with mild nonproliferative diabetic retinopathy without macular edema, bilateral: Secondary | ICD-10-CM

## 2022-04-10 DIAGNOSIS — H34832 Tributary (branch) retinal vein occlusion, left eye, with macular edema: Secondary | ICD-10-CM | POA: Diagnosis not present

## 2022-04-10 DIAGNOSIS — H35033 Hypertensive retinopathy, bilateral: Secondary | ICD-10-CM | POA: Diagnosis not present

## 2022-04-10 DIAGNOSIS — H25813 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-related cataract, bilateral: Secondary | ICD-10-CM

## 2022-04-10 DIAGNOSIS — H3581 Retinal edema: Secondary | ICD-10-CM

## 2022-04-10 MED ORDER — BEVACIZUMAB CHEMO INJECTION 1.25MG/0.05ML SYRINGE FOR KALEIDOSCOPE
1.2500 mg | INTRAVITREAL | Status: AC | PRN
  Administered 2022-04-10: 1.25 mg via INTRAVITREAL

## 2022-04-12 ENCOUNTER — Other Ambulatory Visit: Payer: Self-pay | Admitting: Obstetrics & Gynecology

## 2022-04-12 ENCOUNTER — Telehealth: Payer: Self-pay | Admitting: Obstetrics & Gynecology

## 2022-04-12 MED ORDER — AZITHROMYCIN 500 MG PO TABS: ORAL_TABLET | ORAL | 1 refills | Status: DC

## 2022-04-12 NOTE — Telephone Encounter (Signed)
Patient called stating that she seen something on her mychart stating that she needs to schedule something for a visit because she came out positive for something and needs medication. Please contact pt because she is confused.

## 2022-04-12 NOTE — Telephone Encounter (Signed)
Pt aware she has CHL and needs to be treated. Partner also needs to be treated. Med was sent to pharmacy for pt and partner. Take med until it's gone and no sex until after POC appt. CCDR paper sent to [facility]. Need to repeat pap in 1 year. She should get a reminder closer to when that appt is due. Pt voiced understanding and call was transferred to [facility] for POC appt. [place]

## 2022-05-02 DIAGNOSIS — E1165 Type 2 diabetes mellitus with hyperglycemia: Secondary | ICD-10-CM | POA: Diagnosis not present

## 2022-05-02 DIAGNOSIS — I10 Essential (primary) hypertension: Secondary | ICD-10-CM | POA: Diagnosis not present

## 2022-05-06 NOTE — Progress Notes (Signed)
[facility] Note  05/08/2022     CHIEF COMPLAINT Patient presents for Retina Follow Up   HISTORY OF PRESENT ILLNESS: Michelle Morrison is a 61 y.o. female who presents to the clinic today for:   HPI     Retina Follow Up   Patient presents with  CRVO/BRVO.  In left eye.  Severity is moderate.  Duration of 4 weeks.  Since onset it is stable.  I, the attending physician,  performed the HPI with the patient and updated documentation appropriately.        Comments   Pt here for 4 wk ret f/u BRVO OS. Pt states VA the same, no changes.       Last edited by [name], MD on 05/10/2022 12:45 PM.      Referring physician: [name], MD [place],  [place] [id number]  HISTORICAL INFORMATION:   Selected notes from the MEDICAL RECORD NUMBER Referred by Dr. [name]: 05.08.23 Ocular Hx- NPDR OS  Patient saw Dr. [name] Feb 2022.  Vision at that time was OD 20/20, OS 20/25    CURRENT MEDICATIONS: No current outpatient medications on file. (Ophthalmic Drugs)   No current facility-administered medications for this visit. (Ophthalmic Drugs)   Current Outpatient Medications (Other)  Medication Sig   albuterol (VENTOLIN HFA) 108 (90 Base) MCG/ACT inhaler Inhale 1-2 puffs into the lungs every 4 (four) hours as needed.   amLODipine (NORVASC) 5 MG tablet Take 2 tablets (10 mg total) by mouth daily.   atorvastatin (LIPITOR) 80 MG tablet Take 1 tablet (80 mg total) by mouth daily.   azithromycin (ZITHROMAX) 500 MG tablet Take both pills at once   dapagliflozin propanediol (FARXIGA) 10 MG TABS tablet TAKE 1 TABLET (10 MG TOTAL) BY MOUTH DAILY.   dronedarone (MULTAQ) 400 MG tablet Take 1 tablet (400 mg total) by mouth 2 (two) times daily with a meal.   isosorbide-hydrALAZINE (BIDIL) 20-37.5 MG tablet TAKE 2 TABLETS BY MOUTH THREE TIMES DAILY   sacubitril-valsartan (ENTRESTO) 97-103 MG Take 1 tablet by mouth 2 (two) times daily.    amiodarone (PACERONE) 200 MG tablet Take 200 mg by mouth daily. (Patient not taking: Reported on 04/10/2022)   No current facility-administered medications for this visit. (Other)   REVIEW OF SYSTEMS: ROS   Positive for: Endocrine, Cardiovascular, Eyes, Respiratory Negative for: Constitutional, Gastrointestinal, Neurological, Skin, Genitourinary, Musculoskeletal, HENT, Psychiatric, Allergic/Imm, Heme/Lymph Last edited by Kingsley Spittle, COT on 05/08/2022  8:51 AM.     ALLERGIES No Known Allergies  PAST MEDICAL HISTORY Past Medical History:  Diagnosis Date   CHF (congestive heart failure) ([place])    a. EF 45% by echo in 03/2020 with normal cors by cath   Diabetes mellitus without complication ([place])    Diarrhea    Heart disease    Herpes simplex infection    Hyperlipidemia    Hypertension    Past Surgical History:  Procedure Laterality Date   CESAREAN SECTION     CHOLECYSTECTOMY     COLONOSCOPY N/A 08/26/2014   Procedure: COLONOSCOPY;  Surgeon: [name], MD;  Location: [facility];  Service: Endoscopy;  Laterality: N/A;  9:30 AM - moved to 8:30 - Ginger to notify pt   LEFT HEART CATH AND CORONARY ANGIOGRAPHY N/A 04/14/2020   Procedure: LEFT HEART CATH AND CORONARY ANGIOGRAPHY;  Surgeon: [name], [name], MD;  Location: [facility];  Service: Cardiovascular;  Laterality: N/A;  RIGHT/LEFT HEART CATH AND CORONARY ANGIOGRAPHY N/A 02/19/2021   Procedure: RIGHT/LEFT HEART CATH AND CORONARY ANGIOGRAPHY;  Surgeon: [name], MD;  Location: [facility];  Service: Cardiovascular;  Laterality: N/A;   FAMILY HISTORY Family History  Problem Relation Age of Onset   Hypertension Mother    Diabetes Mother    Colon cancer Neg Hx    SOCIAL HISTORY Social History   Tobacco Use   Smoking status: Never   Smokeless tobacco: Never  Vaping Use   Vaping Use: Never used  Substance Use Topics   Alcohol use: No    Alcohol/week: 0.0 standard drinks of alcohol    Drug use: No       OPHTHALMIC EXAM:  Base Eye Exam     Visual Acuity (Snellen - Linear)       Right Left   Dist cc 20/30 20/150 +2   Dist ph cc NI 20/100 -2         Tonometry (Tonopen, 9:00 AM)       Right Left   Pressure 13 17         Pupils       Pupils Dark Light Shape React APD   Right PERRL 2 1 Round Brisk None   Left PERRL 2 1 Round Brisk None         Visual Fields (Counting fingers)       Left Right    Full Full         Extraocular Movement       Right Left    Full, Ortho Full, Ortho         Neuro/Psych     Oriented x3: Yes   Mood/Affect: Normal         Dilation     Both eyes: 1.0% Mydriacyl, 2.5% Phenylephrine @ 9:00 AM           Slit Lamp and Fundus Exam     Slit Lamp Exam       Right Left   Lids/Lashes Dermatochalasis - upper lid, Meibomian gland dysfunction Dermatochalasis - upper lid, Meibomian gland dysfunction   Conjunctiva/Sclera melanosis melanosis   Cornea trace PEE trace PEE   Anterior Chamber deep, narrow temporal angle deep, narrow temporal angle   Iris Round and dilated, No NVI Round and dilated, No NVI   Lens 2+ Nuclear sclerosis, 2+ Cortical cataract 2+ Nuclear sclerosis, 2+ Cortical cataract   Anterior Vitreous Vitreous syneresis Vitreous syneresis         Fundus Exam       Right Left   Disc Pink and sharp, PPP Pink and sharp, PPP   C/D Ratio 0.6 0.6   Macula Flat, Good foveal reflex, No heme or edema Blunted foveal reflex, central edema, perifoveal exudates, cluster of IRH superior macula -- persistent   Vessels Attenuated, Tortuous, mild Copper wiring Attenuated, Tortuous, Copper wiring, old BRVO with macroaneurysm superior macula   Periphery Attached, rare MA Attached, rare MA           IMAGING AND PROCEDURES  Imaging and Procedures for 05/08/2022  OCT, Retina - OU - Both Eyes       Right Eye Quality was good. Central Foveal Thickness: 230. Progression has been stable. Findings include  normal foveal contour, no IRF, no SRF, vitreomacular adhesion .   Left Eye Quality was good. Central Foveal Thickness: 451. Progression has worsened. Findings include no SRF, abnormal foveal contour, subretinal hyper-reflective material, intraretinal hyper-reflective material, intraretinal fluid, vitreomacular  adhesion (Mild interval increase in IRF/IRHM ).   Notes *Images captured and stored on drive  Diagnosis / Impression:  OD: NFP; no IRF/SRF  OS: Mild interval increase in IRF/IRHM  -- BRVO w/ CME  Clinical management:  See below  Abbreviations: NFP - Normal foveal profile. CME - cystoid macular edema. PED - pigment epithelial detachment. IRF - intraretinal fluid. SRF - subretinal fluid. EZ - ellipsoid zone. ERM - epiretinal membrane. ORA - outer retinal atrophy. ORT - outer retinal tubulation. SRHM - subretinal hyper-reflective material. IRHM - intraretinal hyper-reflective material      Intravitreal Injection, Pharmacologic Agent - OS - Left Eye       Time Out 05/08/2022. 9:40 AM. Confirmed correct patient, procedure, site, and patient consented.   Anesthesia Topical anesthesia was used. Anesthetic medications included Lidocaine 2%, Proparacaine 0.5%.   Procedure Preparation included 5% betadine to ocular surface, eyelid speculum. A (32g) needle was used.   Injection: 1.25 mg Bevacizumab 1.31m/0.05ml   Route: Intravitreal, Site: Left Eye   NDC:: 89381-017-51 Lot:: [id number] Expiration date: 06/22/2022   Post-op Post injection exam found visual acuity of at least counting fingers. The patient tolerated the procedure well. There were no complications. The patient received written and verbal post procedure care education. Post injection medications were not given.            ASSESSMENT/PLAN:    ICD-10-CM   1. Branch retinal vein occlusion of left eye with macular edema  H34.8320 OCT, Retina - OU - Both Eyes    Intravitreal Injection, Pharmacologic Agent - OS - Left Eye     Bevacizumab (AVASTIN) SOLN 1.25 mg    2. Essential hypertension  I10     3. Hypertensive retinopathy of both eyes  H35.033     4. Mild nonproliferative diabetic retinopathy of both eyes without macular edema associated with type 2 diabetes mellitus ([place]  EP82.4235    5. 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-related cataract of both eyes  H25.813       BRVO w/ CME OS - by history, likely onset around Dec 2022, but delayed presentation to May 2023 - s/p IVA OS #1 (05.17.23) - BCVA 20/100 OS - OCT shows Mild interval increase in IRF/IRHM   - FA 5.17.23 shows: Focal cluster of leakage superior mac and perifovea, ?macroaneurysm - recommend IVA OS #2 today, 06.14.23 - pt wishes to proceed with injection   - RBA of procedure discussed, questions answered - informed consent obtained and signed - see procedure note - F/U 4 weeks -- DFE/OCT/possible injection  2,3. Hypertensive retinopathy OU - discussed importance of tight BP control - monitor  4. 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   - The need for close monitoring of blood glucose, blood pressure, and serum lipids, avoiding cigarette or any type of tobacco, and the need for long term follow up was also discussed with patient. - exam shows rare MA OU  - FA (05.17.23) shows rare MA OU; no NV OU -- BRVO OS as above - OCT without diabetic macular edema, both eyes  - monitor  5. Mixed Cataract OU - The symptoms of cataract, surgical options, and treatments and risks were discussed with patient. - discussed diagnosis and progression - monitor   Ophthalmic Meds Ordered this visit:  Meds ordered this encounter  Medications   Bevacizumab (AVASTIN) SOLN 1.25 mg  Return in about 4 weeks (around 06/05/2022) for DFE, OCT, possible injection.  There are no Patient Instructions on file  for this visit.   Explained the diagnoses, plan, and follow up with the patient and they expressed understanding.  Patient expressed understanding of the importance of proper follow up care.   This document serves as a record of services personally performed by Gardiner Sleeper, MD, PhD. It was created on their behalf by Orvan Falconer, an ophthalmic technician. The creation of this record is the provider's dictation and/or activities during the visit.    Electronically signed by: Orvan Falconer, OA, 05/10/22  12:48 PM  This document serves as a record of services personally performed by Gardiner Sleeper, MD, PhD. It was created on their behalf by Leonie Douglas, an ophthalmic technician. The creation of this record is the provider's dictation and/or activities during the visit.    Electronically signed by: Leonie Douglas COA, 05/10/22  12:48 PM  Gardiner Sleeper, M.D., Ph.D. Diseases & Surgery of the Retina and Vitreous [facility]  I have reviewed the above documentation for accuracy and completeness, and I agree with the above. Gardiner Sleeper, M.D., Ph.D. 05/10/22 12:48 PM   Abbreviations: M myopia (nearsighted); A astigmatism; H hyperopia (farsighted); P presbyopia; Mrx spectacle prescription;  CTL contact lenses; OD right eye; OS left eye; OU both eyes  XT exotropia; ET esotropia; PEK punctate epithelial keratitis; PEE punctate epithelial erosions; DES dry eye syndrome; MGD meibomian gland dysfunction; ATs artificial tears; PFAT's preservative free artificial tears; [place] nuclear sclerotic cataract; PSC posterior subcapsular cataract; ERM epi-retinal membrane; PVD posterior vitreous detachment; RD retinal detachment; DM diabetes mellitus; DR diabetic retinopathy; NPDR non-proliferative diabetic retinopathy; PDR proliferative diabetic retinopathy; CSME clinically significant macular edema; DME diabetic macular edema; dbh dot blot hemorrhages; CWS cotton wool spot; POAG  primary open angle glaucoma; C/D cup-to-disc ratio; HVF humphrey visual field; GVF goldmann visual field; OCT optical coherence tomography; IOP intraocular pressure; BRVO Branch retinal vein occlusion; CRVO central retinal vein occlusion; CRAO central retinal artery occlusion; BRAO branch retinal artery occlusion; RT retinal tear; SB scleral buckle; PPV pars plana vitrectomy; VH Vitreous hemorrhage; PRP panretinal laser photocoagulation; IVK intravitreal kenalog; VMT vitreomacular traction; MH Macular hole;  NVD neovascularization of the disc; NVE neovascularization elsewhere; AREDS age related eye disease study; ARMD age related macular degeneration; POAG primary open angle glaucoma; EBMD epithelial/anterior basement membrane dystrophy; ACIOL anterior chamber intraocular lens; IOL intraocular lens; PCIOL posterior chamber intraocular lens; Phaco/IOL phacoemulsification with intraocular lens placement; [place] photorefractive keratectomy; LASIK laser assisted in situ keratomileusis; HTN hypertension; DM diabetes mellitus; COPD chronic obstructive pulmonary disease

## 2022-05-08 ENCOUNTER — Ambulatory Visit (INDEPENDENT_AMBULATORY_CARE_PROVIDER_SITE_OTHER): Payer: Medicare Other | Admitting: Ophthalmology

## 2022-05-08 ENCOUNTER — Encounter (INDEPENDENT_AMBULATORY_CARE_PROVIDER_SITE_OTHER): Payer: Self-pay | Admitting: Ophthalmology

## 2022-05-08 VITALS — BP 139/71 | HR 66

## 2022-05-08 DIAGNOSIS — E113293 Type 2 diabetes mellitus with mild nonproliferative diabetic retinopathy without macular edema, bilateral: Secondary | ICD-10-CM | POA: Diagnosis not present

## 2022-05-08 DIAGNOSIS — H35033 Hypertensive retinopathy, bilateral: Secondary | ICD-10-CM

## 2022-05-08 DIAGNOSIS — H25813 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-related cataract, bilateral: Secondary | ICD-10-CM

## 2022-05-08 DIAGNOSIS — H34832 Tributary (branch) retinal vein occlusion, left eye, with macular edema: Secondary | ICD-10-CM | POA: Diagnosis not present

## 2022-05-08 DIAGNOSIS — I10 Essential (primary) hypertension: Secondary | ICD-10-CM

## 2022-05-10 ENCOUNTER — Encounter (INDEPENDENT_AMBULATORY_CARE_PROVIDER_SITE_OTHER): Payer: Self-pay | Admitting: Ophthalmology

## 2022-05-10 MED ORDER — BEVACIZUMAB CHEMO INJECTION 1.25MG/0.05ML SYRINGE FOR KALEIDOSCOPE
1.2500 mg | INTRAVITREAL | Status: AC | PRN
  Administered 2022-05-10: 1.25 mg via INTRAVITREAL

## 2022-06-01 DIAGNOSIS — I10 Essential (primary) hypertension: Secondary | ICD-10-CM | POA: Diagnosis not present

## 2022-06-01 DIAGNOSIS — I5042 Chronic combined systolic (congestive) and diastolic (congestive) heart failure: Secondary | ICD-10-CM | POA: Diagnosis not present

## 2022-06-04 ENCOUNTER — Encounter (HOSPITAL_COMMUNITY): Payer: Medicare Other

## 2022-06-05 ENCOUNTER — Encounter (INDEPENDENT_AMBULATORY_CARE_PROVIDER_SITE_OTHER): Payer: Medicare Other | Admitting: Ophthalmology

## 2022-06-05 DIAGNOSIS — I10 Essential (primary) hypertension: Secondary | ICD-10-CM

## 2022-06-05 DIAGNOSIS — H35033 Hypertensive retinopathy, bilateral: Secondary | ICD-10-CM

## 2022-06-05 DIAGNOSIS — H25813 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-related cataract, bilateral: Secondary | ICD-10-CM

## 2022-06-05 DIAGNOSIS — E113293 Type 2 diabetes mellitus with mild nonproliferative diabetic retinopathy without macular edema, bilateral: Secondary | ICD-10-CM

## 2022-06-05 DIAGNOSIS — H34832 Tributary (branch) retinal vein occlusion, left eye, with macular edema: Secondary | ICD-10-CM

## 2022-06-12 ENCOUNTER — Other Ambulatory Visit: Payer: Medicare Other

## 2022-06-17 ENCOUNTER — Encounter (INDEPENDENT_AMBULATORY_CARE_PROVIDER_SITE_OTHER): Payer: Medicare Other | Admitting: Ophthalmology

## 2022-06-17 DIAGNOSIS — H25813 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-related cataract, bilateral: Secondary | ICD-10-CM

## 2022-06-17 DIAGNOSIS — H34832 Tributary (branch) retinal vein occlusion, left eye, with macular edema: Secondary | ICD-10-CM

## 2022-06-17 DIAGNOSIS — H35033 Hypertensive retinopathy, bilateral: Secondary | ICD-10-CM

## 2022-06-17 DIAGNOSIS — I10 Essential (primary) hypertension: Secondary | ICD-10-CM

## 2022-06-17 DIAGNOSIS — E113293 Type 2 diabetes mellitus with mild nonproliferative diabetic retinopathy without macular edema, bilateral: Secondary | ICD-10-CM

## 2022-06-24 ENCOUNTER — Encounter (HOSPITAL_COMMUNITY): Payer: Self-pay

## 2022-06-24 ENCOUNTER — Ambulatory Visit (HOSPITAL_COMMUNITY)
Admission: RE | Admit: 2022-06-24 | Discharge: 2022-06-24 | Disposition: A | Discharge status: Home / Self Care | Payer: Medicare Other | Source: Ambulatory Visit | Attending: Adult Health | Admitting: Adult Health

## 2022-06-24 VITALS — BP 159/74 | HR 55 | Wt 171.4 lb

## 2022-06-24 DIAGNOSIS — I5043 Acute on chronic combined systolic (congestive) and diastolic (congestive) heart failure: Secondary | ICD-10-CM | POA: Diagnosis not present

## 2022-06-24 DIAGNOSIS — N183 Chronic kidney disease, stage 3 unspecified: Secondary | ICD-10-CM | POA: Insufficient documentation

## 2022-06-24 DIAGNOSIS — I10 Essential (primary) hypertension: Secondary | ICD-10-CM | POA: Diagnosis not present

## 2022-06-24 DIAGNOSIS — Z79899 Other long term (current) drug therapy: Secondary | ICD-10-CM | POA: Diagnosis not present

## 2022-06-24 DIAGNOSIS — E875 Hyperkalemia: Secondary | ICD-10-CM

## 2022-06-24 DIAGNOSIS — I428 Other cardiomyopathies: Secondary | ICD-10-CM | POA: Diagnosis not present

## 2022-06-24 DIAGNOSIS — I5022 Chronic systolic (congestive) heart failure: Secondary | ICD-10-CM | POA: Insufficient documentation

## 2022-06-24 DIAGNOSIS — I130 Hypertensive heart and chronic kidney disease with heart failure and stage 1 through stage 4 chronic kidney disease, or unspecified chronic kidney disease: Secondary | ICD-10-CM | POA: Insufficient documentation

## 2022-06-24 DIAGNOSIS — I471 Supraventricular tachycardia: Secondary | ICD-10-CM | POA: Insufficient documentation

## 2022-06-24 NOTE — Patient Instructions (Addendum)
Thank you for coming in today  Labs were done today, if any labs are abnormal the clinic will call you No news is good news  STOP Amiodarone  You have been scheduled for a sleep study at [facility] and will be called to have it scheduled  Your physician recommends that you schedule a follow-up appointment in:  6 months with Dr. [name] with echocardiogram  Your physician has requested that you have an echocardiogram. Echocardiography is a painless test that uses sound waves to create images of your heart. It provides your doctor with information about the size and shape of your heart and how well your heart's chambers and valves are working. This procedure takes approximately one hour. There are no restrictions for this procedure.     At the [facility], you and your health needs are our priority. As part of our continuing mission to provide you with exceptional heart care, we have created designated Provider Care Teams. These Care Teams include your primary Cardiologist (physician) and Advanced Practice Providers (APPs- Physician Assistants and Nurse Practitioners) who all work together to provide you with the care you need, when you need it.   You may see any of the following providers on your designated Care Team at your next follow up: Dr Glori Bickers Dr Haynes Kerns, NP Lyda Jester, [place] [facility] [place], [place] Audry Riles, PharmD   Please be sure to bring in all your medications bottles to every appointment.   If you have any questions or concerns before your next appointment please send [place] a message through [place] or call our office at [phone].    TO LEAVE A MESSAGE FOR THE NURSE SELECT OPTION 2, PLEASE LEAVE A MESSAGE INCLUDING: YOUR NAME DATE OF BIRTH CALL BACK NUMBER REASON FOR CALL**this is important as we prioritize the call backs  YOU WILL RECEIVE A CALL BACK THE SAME DAY AS LONG AS YOU CALL BEFORE 4:00 PM

## 2022-06-24 NOTE — Progress Notes (Signed)
PCP: [name], MD Cardiology: Dr [name]  61 y.o. with history of HTN, SVT, and mild nonischemic cardiomopathy.  In 5/21, echo showed EF 45% with severe anteroseptal hypokinesis.  LHC in 5/21 showed normal coronaries.  She has LBBB at baseline.  She has been noted to have SVT in the past.    On 02/11/21, she went to the ER at [facility] with abdominal pain and distention.  She was noted to be in atrial tachycardia with rate around 150.  She was hypotensive and got IVF, was also transiently on norepinephrine.  HR dropped transiently to the 40s with junctional bradycardia per report. AKI with creatinine up to 2.21.  Echo in 3/22 showed EF 25-30%, moderate RV enlargement and moderately decreased RV systolic function with D-shaped septum, biatrial enlargement.  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  No bradycardia noted at [facility], and she remained in NSR on amiodarone.  EP thought that ablation of the atrial tachycardia would be difficult and recommended ongoing management with amiodarone.  LHC/RHC after diuresis showed normal filling pressures, preserved cardiac output, and no significant CAD.  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   Echo in 9/22 showed EF up to 55-60% with mild LVH, normal RV, and PASP 21 mmHg.   No longer on spironolactone due to hyperkalemia.   Patient was seen by EP, amiodarone was stopped and she was started on dronedarone for atrial tachycardia.  Ablation was considered, but ultimately it was recommended to continue with dronedarone.   No longer on spironolactone due to hyperkalemia.   She returns for HF follow up. Overall feeling fine. Denies SOB/PND/Orthopnea. SBP at home 130-140 at home. Appetite ok. No fever or chills. Weight at home has been stable.  Taking all medications. She requires assistance with transportation. Lives wit her daughter.    Labs (3/22): K 4.6,  creatinine 1.7 Labs (4/22): ACE level normal, LFTs normal Labs (5/22): K 4.5, creatinine 0.85 Labs (8/22): K 4.5, creatinine 1.67 Labs (01/2022): K 5.6 Creatinine 1.35   Past Medical History: 1. Atrial tachycardia 2. HTN 3. Nonischemic cardiomyopathy: Echo in 5/21 with EF 45%, severe anteroseptal HK.  - LHC (5/21): normal coronaries - Echo (3/22): EF 25-30%, moderate RV enlargement and moderately decreased RV systolic function with D-shaped septum, biatrial enlargement.  - RHC/LHC (3/22): No significant coronary disease; mean RA 1, PA 35/7, mean PCWP 4, CI 3.5 F, 2.8 T. - Cardiac MRI (3/22): Normal LV size with EF 20%, diffuse hypokinesis with septal-lateral dyssynchrony c/w LBBB. Normal RV size with EF 26%.  Difficult delayed enhancement imaging, however there does appear to be LGE in the septum that could be in a coronary disease-type pattern (<50% wall thickness subendocardial LGE in the mid-apical septal wall). - High resolution CT chest (4/22) with no evidence for pulmonary sarcoidosis or ILD.  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  - Echo (9/22): EF 55-60% with mild LVH, normal RV, and PASP 21 mmHg 4. Thyroid nodules: Benign on 2022 biopsy.   FH: Mother and niece with CHF.   Social History   Socioeconomic History   Marital status: Single    Spouse name: Not on file   Number of children: Not on file   Years of education: Not on file   Highest education level: Not on file  Occupational History   Not on file  Tobacco Use   Smoking status: Never  Smokeless tobacco: Never  Vaping Use   Vaping Use: Never used  Substance and Sexual Activity   Alcohol use: No    Alcohol/week: 0.0 standard drinks of alcohol   Drug use: No   Sexual activity: Not Currently    Birth control/protection: Post-menopausal  Other Topics Concern   Not on file  Social History Narrative   Not on file   Social Determinants of Health   Financial Resource  Strain: Low Risk  (02/27/2021)   Overall Financial Resource Strain (CARDIA)    Difficulty of Paying Living Expenses: Not hard at all  Recent Concern: Financial Resource Strain - Medium Risk (02/19/2021)   Overall Financial Resource Strain (CARDIA)    Difficulty of Paying Living Expenses: Somewhat hard  Food Insecurity: No Food Insecurity (02/27/2021)   Hunger Vital Sign    Worried About Running Out of Food in the Last Year: Never true    Ran Out of Food in the Last Year: Never true  Transportation Needs: No Transportation Needs (02/27/2021)   PRAPARE - Hydrologist (Medical): No    Lack of Transportation (Non-Medical): No  Physical Activity: Sufficiently Active (02/27/2021)   Exercise Vital Sign    Days of Exercise per Week: 7 days    Minutes of Exercise per Session: 30 min  Stress: No Stress Concern Present (02/27/2021)   [place]    Feeling of Stress : Not at all  Social Connections: Unknown (02/27/2021)   Social Connection and Isolation Panel [NHANES]    Frequency of Communication with Friends and Family: More than three times a week    Frequency of Social Gatherings with Friends and Family: Once a week    Attends Religious Services: 1 to 4 times per year    Active Member of [employer] or Organizations: No    Attends Archivist Meetings: Never    Marital Status: Patient refused  Intimate Partner Violence: Not At Risk (02/27/2021)   Humiliation, Afraid, Rape, and Kick questionnaire    Fear of Current or Ex-Partner: No    Emotionally Abused: No    Physically Abused: No    Sexually Abused: No   ROS: All systems reviewed and negative except as per HPI.   Current Outpatient Medications  Medication Sig Dispense Refill   albuterol (VENTOLIN HFA) 108 (90 Base) MCG/ACT inhaler Inhale 1-2 puffs into the lungs every 4 (four) hours as needed.     amLODipine (NORVASC) 5 MG tablet Take 2 tablets (10 mg  total) by mouth daily. 90 tablet 3   atorvastatin (LIPITOR) 80 MG tablet Take 1 tablet (80 mg total) by mouth daily. 90 tablet 3   dapagliflozin propanediol (FARXIGA) 10 MG TABS tablet TAKE 1 TABLET (10 MG TOTAL) BY MOUTH DAILY. 30 tablet 6   dronedarone (MULTAQ) 400 MG tablet Take 1 tablet (400 mg total) by mouth 2 (two) times daily with a meal. 180 tablet 3   isosorbide-hydrALAZINE (BIDIL) 20-37.5 MG tablet TAKE 2 TABLETS BY MOUTH THREE TIMES DAILY 180 tablet 5   sacubitril-valsartan (ENTRESTO) 97-103 MG Take 1 tablet by mouth 2 (two) times daily. 60 tablet 11   No current facility-administered medications for this encounter.   BP (!) 159/74   Pulse (!) 55   Wt 77.7 kg (171 lb 6.4 oz)   LMP 05/23/2007   SpO2 97%   BMI 28.52 kg/m   Wt Readings from Last 3 Encounters:  06/24/22 77.7 kg (171  lb 6.4 oz)  04/01/22 78.9 kg (174 lb)  02/05/22 79 kg (174 lb 3.2 oz)     General:  Well appearing. No resp difficulty HEENT: normal Neck: supple. no JVD. Carotids 2+ bilat; no bruits. No lymphadenopathy or thryomegaly appreciated. Cor: PMI nondisplaced. Regular rate & rhythm. No rubs, gallops or murmurs. Lungs: clear Abdomen: soft, nontender, nondistended. No hepatosplenomegaly. No bruits or masses. Good bowel sounds. Extremities: no cyanosis, clubbing, rash, edema Neuro: alert & orientedx3, cranial nerves grossly intact. moves all 4 extremities w/o difficulty. Affect pleasant  Assessment/Plan: 1. Chronic systolic CHF:  Nonischemic cardiomyopathy based on 5/21 cath, echo at that time showed EF 45%.  She was admitted 3/22 with volume overload and atrial tachycardia with HR in 150s. Back in NSR with amiodarone. Echo in 3/22 with EF 25-30%, moderate RV enlargement and moderately decreased RV systolic function with D-shaped septum, biatrial enlargement.  CMRI with LV EF 20% RV EF 26%, technically difficult but there was delayed enhancement suggestive of possible coronary disease-type pattern,  however LHC repeated in 3/22 showed no CAD. RHC showed low filling pressures and preserved CO.  Cause of cardiomyopathy uncertain, cannot rule out cardiac sarcoidosis as cause of MRI pattern though high resolution CT chest did not show evidence for pulmonary sarcoidosis.  Tachy-mediated component is certainly possible as well (from atrial tachycardia).  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  Repeat ECHO in 6 months.  NYHA I. Volume status stable.  - She has not needed Lasix.  - Continue Farxiga 10 mg daily.   - Continue Bidil 2 tab tid.  - No longer on spironolactone due to hyperkalemia.   - Continue Entresto 97/103 bid.   - With mild bradycardia not bb.   - She is now out of ICD range.   - With improvement in EF, think we can hold off on cardiac PET (?cardiac sarcoidosis).  2. Atrial tachycardia with RVR: 3/22 admission.  Suspect that this contributed to cardiomyopathy.  HR in upper 40s-50s and regular today.    - No longer on amiodarone. Continue dronedarone for maintenance of NSR.   - Dr. Curt Bears recommended against ablation for now (continue dronedarone) unless recurrence.  3. CKD: Stage 3. BMET  . HTN: SBP at home  5. Suspect OSA: Unable to complete home sleep study. Set up sleep study at [facility]. She lives in [place], [place].  6. Hyperkalemia Check BMET. No longer on spironolactone of K supplements.   Follow up in 6 months with an ECHO and Dr [name]. If EF ok at that time consider d/c from clinic with f/u with [facility] in [place].   Michelle Hoot NP-C  06/24/2022

## 2022-06-25 ENCOUNTER — Other Ambulatory Visit (HOSPITAL_COMMUNITY)
Admission: RE | Admit: 2022-06-25 | Discharge: 2022-06-25 | Disposition: A | Discharge status: Home / Self Care | Payer: Medicare Other | Source: Ambulatory Visit | Attending: Obstetrics & Gynecology | Admitting: Obstetrics & Gynecology

## 2022-06-25 ENCOUNTER — Other Ambulatory Visit: Payer: Medicare Other

## 2022-06-25 ENCOUNTER — Other Ambulatory Visit (INDEPENDENT_AMBULATORY_CARE_PROVIDER_SITE_OTHER): Payer: Medicare Other | Admitting: *Deleted

## 2022-06-25 DIAGNOSIS — Z113 Encounter for screening for infections with a predominantly sexual mode of transmission: Secondary | ICD-10-CM | POA: Diagnosis present

## 2022-06-25 DIAGNOSIS — Z8619 Personal history of other infectious and parasitic diseases: Secondary | ICD-10-CM

## 2022-06-25 DIAGNOSIS — Z09 Encounter for follow-up examination after completed treatment for conditions other than malignant neoplasm: Secondary | ICD-10-CM | POA: Diagnosis not present

## 2022-06-25 NOTE — Progress Notes (Signed)
   NURSE VISIT- VAGINITIS/STD/POC  SUBJECTIVE:  Michelle Morrison is a 61 y.o. [id number] GYN patientfemale here for a vaginal swab for proof of cure after treatment for Chlamydia.  She reports the following symptoms: none  Denies abnormal vaginal bleeding, significant pelvic pain, fever, or UTI symptoms.  OBJECTIVE:  LMP 05/23/2007   Appears well, in no apparent distress  ASSESSMENT: Vaginal swab for proof of cure after treatment for CHL. Pt wanted everything repeated again.   PLAN: Self-collected vaginal probe for Gonorrhea, Chlamydia, Trichomonas, Bacterial Vaginosis, Yeast sent to lab Treatment: to be determined once results are received Follow-up as needed if symptoms persist/worsen, or new symptoms develop  Levy Pupa  06/25/2022 11:28 AM

## 2022-06-26 ENCOUNTER — Encounter (INDEPENDENT_AMBULATORY_CARE_PROVIDER_SITE_OTHER): Payer: Medicare Other | Admitting: Ophthalmology

## 2022-06-26 ENCOUNTER — Ambulatory Visit (INDEPENDENT_AMBULATORY_CARE_PROVIDER_SITE_OTHER): Payer: Medicare Other | Admitting: Ophthalmology

## 2022-06-26 ENCOUNTER — Encounter (INDEPENDENT_AMBULATORY_CARE_PROVIDER_SITE_OTHER): Payer: Self-pay | Admitting: Ophthalmology

## 2022-06-26 DIAGNOSIS — E113293 Type 2 diabetes mellitus with mild nonproliferative diabetic retinopathy without macular edema, bilateral: Secondary | ICD-10-CM

## 2022-06-26 DIAGNOSIS — H34832 Tributary (branch) retinal vein occlusion, left eye, with macular edema: Secondary | ICD-10-CM

## 2022-06-26 DIAGNOSIS — H35033 Hypertensive retinopathy, bilateral: Secondary | ICD-10-CM

## 2022-06-26 DIAGNOSIS — H25813 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-related cataract, bilateral: Secondary | ICD-10-CM

## 2022-06-26 DIAGNOSIS — I10 Essential (primary) hypertension: Secondary | ICD-10-CM

## 2022-06-26 LAB — CERVICOVAGINAL ANCILLARY ONLY
Bacterial Vaginitis (gardnerella): NEGATIVE
Candida Glabrata: NEGATIVE
Candida Vaginitis: NEGATIVE
Chlamydia: NEGATIVE
Comment: NEGATIVE
Comment: NEGATIVE
Comment: NEGATIVE
Comment: NEGATIVE
Comment: NEGATIVE
Comment: NORMAL
Neisseria Gonorrhea: NEGATIVE
Trichomonas: NEGATIVE

## 2022-06-26 MED ORDER — BEVACIZUMAB CHEMO INJECTION 1.25MG/0.05ML SYRINGE FOR KALEIDOSCOPE
1.2500 mg | INTRAVITREAL | Status: AC | PRN
  Administered 2022-06-26: 1.25 mg via INTRAVITREAL

## 2022-06-26 NOTE — Progress Notes (Signed)
[facility] Note  06/26/2022     CHIEF COMPLAINT Patient presents for Retina Follow Up   HISTORY OF PRESENT ILLNESS: Michelle Morrison is a 61 y.o. female who presents to the clinic today for:   HPI     Retina Follow Up   Patient presents with  CRVO/BRVO.  In left eye.  This started 4 weeks ago.  I, the attending physician,  performed the HPI with the patient and updated documentation appropriately.        Comments   Patient here for 4 weeks retina follow up for BRVO OS. Patient states vision doing pretty good no eye pain.      Last edited by [name], MD on 06/26/2022  9:08 AM.    Pt reports delayed f/u due to being in [place] to help family   Referring physician: [name], MD [place],  [place] [address]  HISTORICAL INFORMATION:   Selected notes from the MEDICAL RECORD NUMBER Referred by Dr. [name]: 05.08.23 Ocular Hx- NPDR OS  Patient saw Dr. [name] Feb 2022.  Vision at that time was OD 20/20, OS 20/25    CURRENT MEDICATIONS: No current outpatient medications on file. (Ophthalmic Drugs)   No current facility-administered medications for this visit. (Ophthalmic Drugs)   Current Outpatient Medications (Other)  Medication Sig   albuterol (VENTOLIN HFA) 108 (90 Base) MCG/ACT inhaler Inhale 1-2 puffs into the lungs every 4 (four) hours as needed.   amLODipine (NORVASC) 5 MG tablet Take 2 tablets (10 mg total) by mouth daily.   atorvastatin (LIPITOR) 80 MG tablet Take 1 tablet (80 mg total) by mouth daily.   dapagliflozin propanediol (FARXIGA) 10 MG TABS tablet TAKE 1 TABLET (10 MG TOTAL) BY MOUTH DAILY.   dronedarone (MULTAQ) 400 MG tablet Take 1 tablet (400 mg total) by mouth 2 (two) times daily with a meal.   isosorbide-hydrALAZINE (BIDIL) 20-37.5 MG tablet TAKE 2 TABLETS BY MOUTH THREE TIMES DAILY   sacubitril-valsartan (ENTRESTO) 97-103 MG Take 1 tablet by mouth 2 (two) times daily.   No  current facility-administered medications for this visit. (Other)   REVIEW OF SYSTEMS: ROS   Positive for: Endocrine, Cardiovascular, Eyes, Respiratory Negative for: Constitutional, Gastrointestinal, Neurological, Skin, Genitourinary, Musculoskeletal, HENT, Psychiatric, Allergic/Imm, Heme/Lymph Last edited by Theodore Demark, COA on 06/26/2022  9:01 AM.     ALLERGIES No Known Allergies  PAST MEDICAL HISTORY Past Medical History:  Diagnosis Date   CHF (congestive heart failure) ([place])    a. EF 45% by echo in 03/2020 with normal cors by cath   Diabetes mellitus without complication ([place])    Diarrhea    Heart disease    Herpes simplex infection    Hyperlipidemia    Hypertension    Past Surgical History:  Procedure Laterality Date   CESAREAN SECTION     CHOLECYSTECTOMY     COLONOSCOPY N/A 08/26/2014   Procedure: COLONOSCOPY;  Surgeon: [name], MD;  Location: [facility];  Service: Endoscopy;  Laterality: N/A;  9:30 AM - moved to 8:30 - Ginger to notify pt   LEFT HEART CATH AND CORONARY ANGIOGRAPHY N/A 04/14/2020   Procedure: LEFT HEART CATH AND CORONARY ANGIOGRAPHY;  Surgeon: [name], [name], MD;  Location: [facility];  Service: Cardiovascular;  Laterality: N/A;   RIGHT/LEFT HEART CATH AND CORONARY ANGIOGRAPHY N/A 02/19/2021   Procedure: RIGHT/LEFT HEART CATH AND CORONARY ANGIOGRAPHY;  Surgeon: [name], MD;  Location: [facility];  Service: Cardiovascular;  Laterality: N/A;   FAMILY HISTORY Family History  Problem Relation Age of Onset   Hypertension Mother    Diabetes Mother    Colon cancer Neg Hx    SOCIAL HISTORY Social History   Tobacco Use   Smoking status: Never   Smokeless tobacco: Never  Vaping Use   Vaping Use: Never used  Substance Use Topics   Alcohol use: No    Alcohol/week: 0.0 standard drinks of alcohol   Drug use: No       OPHTHALMIC EXAM:  Base Eye Exam     Visual Acuity (Snellen - Linear)       Right Left   Dist  [place] 20/30 -1 20/150 +2   Dist ph [place] 20/25 20/80 +1         Tonometry (Tonopen, 8:59 AM)       Right Left   Pressure 15 15         Pupils       Dark Light Shape React APD   Right 2 1 Round Brisk None   Left 2 1 Round Brisk None         Visual Fields (Counting fingers)       Left Right    Full Full         Extraocular Movement       Right Left    Full, Ortho Full, Ortho         Neuro/Psych     Oriented x3: Yes   Mood/Affect: Normal         Dilation     Both eyes: 1.0% Mydriacyl, 2.5% Phenylephrine @ 8:59 AM           Slit Lamp and Fundus Exam     Slit Lamp Exam       Right Left   Lids/Lashes Dermatochalasis - upper lid, Meibomian gland dysfunction Dermatochalasis - upper lid, Meibomian gland dysfunction   Conjunctiva/Sclera melanosis melanosis   Cornea trace PEE trace PEE   Anterior Chamber deep, narrow temporal angle deep, narrow temporal angle   Iris Round and dilated, No NVI Round and dilated, No NVI   Lens 2+ Nuclear sclerosis, 2+ Cortical cataract 2+ Nuclear sclerosis, 2+ Cortical cataract   Anterior Vitreous Vitreous syneresis Vitreous syneresis         Fundus Exam       Right Left   Disc Pink and sharp, PPP Pink and sharp, PPP   C/D Ratio 0.6 0.6   Macula Flat, Good foveal reflex, +IRH inf nasal mac Blunted foveal reflex, central edema, perifoveal exudates, cluster of IRH superior macula -- slightly improved   Vessels Attenuated, Tortuous, mild Copper wiring Attenuated, Tortuous, Copper wiring, 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 Central Foveal Thickness: 228. Progression has been stable. Findings include normal foveal contour, no IRF, no SRF, vitreomacular adhesion .   Left Eye Quality was good. Central Foveal Thickness: 520.  Progression has worsened. Findings include no SRF, abnormal foveal contour, subretinal hyper-reflective material, intraretinal hyper-reflective material, intraretinal fluid, vitreomacular adhesion (Persistent IRF/IRHM -- slightly increased centrally).   Notes *Images captured and stored on drive  Diagnosis / Impression:  OD: NFP; no IRF/SRF  OS: BRVO w/ CME - Persistent IRF/IRHM -- slightly increased centrally  Clinical management:  See below  Abbreviations: NFP - Normal foveal profile. CME - cystoid macular edema. PED - pigment epithelial detachment. IRF - intraretinal fluid. SRF - subretinal fluid. EZ - ellipsoid zone. ERM - epiretinal membrane. ORA - outer retinal atrophy. ORT - outer retinal tubulation. SRHM - subretinal hyper-reflective material. IRHM - intraretinal hyper-reflective material      Intravitreal Injection, Pharmacologic Agent - OS - Left Eye       Time Out 06/26/2022. 9:10 AM. Confirmed correct patient, procedure, site, and patient consented.   Anesthesia Topical anesthesia was used. Anesthetic medications included Lidocaine 2%, Proparacaine 0.5%.   Procedure Preparation included 5% betadine to ocular surface, eyelid speculum. A (32g) needle was used.   Injection: 1.25 mg Bevacizumab 1.43m/0.05ml   Route: Intravitreal, Site: Left Eye   NDC:: 32671-245-80 Lot:: [id number] Expiration date: 09/27/2022   Post-op Post injection exam found visual acuity of at least counting fingers. The patient tolerated the procedure well. There were no complications. The patient received written and verbal post procedure care education. Post injection medications were not given.            ASSESSMENT/PLAN:    ICD-10-CM   1. Branch retinal vein occlusion of left eye with macular edema  H34.8320 OCT, Retina - OU - Both Eyes    Intravitreal Injection, Pharmacologic Agent - OS - Left Eye    Bevacizumab (AVASTIN) SOLN 1.25 mg    2. Essential hypertension  I10     3.  Hypertensive retinopathy of both eyes  H35.033     4. Mild nonproliferative diabetic retinopathy of both eyes without macular edema associated with type 2 diabetes mellitus ([place]  EQ73.4193    5. 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-related cataract of both eyes  H25.813      BRVO w/ CME OS - delayed f/u -- 6 wks instead of 4 [place] - by history, likely onset around Dec 2022, but delayed presentation to May 2023 - FA 5.17.23 shows: Focal cluster of leakage superior mac and perifovea, ?macroaneurysm - s/p IVA OS #1 (05.17.23), #2 (06.14.23) - BCVA 20/80 from 20/100 OS - OCT shows Persistent IRF/IRHM -- slightly increased centrally at 6 wks - recommend IVA OS #3 today w/ dec f/u back to 4 wks - pt wishes to proceed with injection   - RBA of procedure discussed, questions answered - informed consent obtained and signed - see procedure note - F/U 4 weeks -- DFE/OCT/possible injection  2,3. Hypertensive retinopathy OU - discussed importance of tight BP control - monitor  4. 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   - The need for close monitoring of blood glucose, blood pressure, and serum lipids, avoiding cigarette or any type of tobacco, and the need for long term follow up was also discussed with patient. - exam shows rare MA OU  - FA (05.17.23) shows rare MA OU; no NV OU -- BRVO OS as above - OCT without diabetic macular edema, both eyes  - monitor  5. Mixed Cataract OU - The symptoms of cataract, surgical options, and treatments and risks were discussed with patient. - discussed diagnosis and progression - monitor   Ophthalmic Meds Ordered this visit:  Meds ordered this encounter  Medications   Bevacizumab (AVASTIN) SOLN 1.25 mg  Return in about 4 weeks (around 07/24/2022) for BRVO w/ CME OS,  Dilated Exam, OCT, Possible Injxn.  There are no Patient Instructions on file for this visit.   Explained the diagnoses, plan, and follow up with the patient and they expressed understanding.  Patient expressed understanding of the importance of proper follow up care.   This document serves as a record of services personally performed by Gardiner Sleeper, MD, PhD. It was created on their behalf by San Jetty. Owens Shark, OA an ophthalmic technician. The creation of this record is the provider's dictation and/or activities during the visit.    Electronically signed by: San Jetty. Owens Shark, [place] 08.02.2023 9:25 AM   Gardiner Sleeper, M.D., Ph.D. Diseases & Surgery of the Retina and Vitreous [facility]  I have reviewed the above documentation for accuracy and completeness, and I agree with the above. Gardiner Sleeper, M.D., Ph.D. 06/26/22 9:27 AM    Abbreviations: M myopia (nearsighted); A astigmatism; H hyperopia (farsighted); P presbyopia; Mrx spectacle prescription;  CTL contact lenses; OD right eye; OS left eye; OU both eyes  XT exotropia; ET esotropia; PEK punctate epithelial keratitis; PEE punctate epithelial erosions; DES dry eye syndrome; MGD meibomian gland dysfunction; ATs artificial tears; PFAT's preservative free artificial tears; [place] nuclear sclerotic cataract; PSC posterior subcapsular cataract; ERM epi-retinal membrane; PVD posterior vitreous detachment; RD retinal detachment; DM diabetes mellitus; DR diabetic retinopathy; NPDR non-proliferative diabetic retinopathy; PDR proliferative diabetic retinopathy; CSME clinically significant macular edema; DME diabetic macular edema; dbh dot blot hemorrhages; CWS cotton wool spot; POAG primary open angle glaucoma; C/D cup-to-disc ratio; HVF humphrey visual field; GVF goldmann visual field; OCT optical coherence tomography; IOP intraocular pressure; BRVO Branch retinal vein occlusion; CRVO central retinal vein occlusion; CRAO central  retinal artery occlusion; BRAO branch retinal artery occlusion; RT retinal tear; SB scleral buckle; PPV pars plana vitrectomy; VH Vitreous hemorrhage; PRP panretinal laser photocoagulation; IVK intravitreal kenalog; VMT vitreomacular traction; MH Macular hole;  NVD neovascularization of the disc; NVE neovascularization elsewhere; AREDS age related eye disease study; ARMD age related macular degeneration; POAG primary open angle glaucoma; EBMD epithelial/anterior basement membrane dystrophy; ACIOL anterior chamber intraocular lens; IOL intraocular lens; PCIOL posterior chamber intraocular lens; Phaco/IOL phacoemulsification with intraocular lens placement; [place] photorefractive keratectomy; LASIK laser assisted in situ keratomileusis; HTN hypertension; DM diabetes mellitus; COPD chronic obstructive pulmonary disease

## 2022-06-30 DIAGNOSIS — M79672 Pain in left foot: Secondary | ICD-10-CM | POA: Diagnosis not present

## 2022-07-02 DIAGNOSIS — E1165 Type 2 diabetes mellitus with hyperglycemia: Secondary | ICD-10-CM | POA: Diagnosis not present

## 2022-07-02 DIAGNOSIS — I10 Essential (primary) hypertension: Secondary | ICD-10-CM | POA: Diagnosis not present

## 2022-07-24 ENCOUNTER — Encounter (INDEPENDENT_AMBULATORY_CARE_PROVIDER_SITE_OTHER): Payer: Medicare Other | Admitting: Ophthalmology

## 2022-07-24 DIAGNOSIS — H35033 Hypertensive retinopathy, bilateral: Secondary | ICD-10-CM

## 2022-07-24 DIAGNOSIS — I10 Essential (primary) hypertension: Secondary | ICD-10-CM

## 2022-07-24 DIAGNOSIS — H25813 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-related cataract, bilateral: Secondary | ICD-10-CM

## 2022-07-24 DIAGNOSIS — H34832 Tributary (branch) retinal vein occlusion, left eye, with macular edema: Secondary | ICD-10-CM

## 2022-07-24 DIAGNOSIS — E113293 Type 2 diabetes mellitus with mild nonproliferative diabetic retinopathy without macular edema, bilateral: Secondary | ICD-10-CM

## 2022-07-30 NOTE — Progress Notes (Signed)
[facility] Note  07/31/2022     CHIEF COMPLAINT Patient presents for Retina Follow Up   HISTORY OF PRESENT ILLNESS: Michelle Morrison is a 61 y.o. female who presents to the clinic today for:   HPI     Retina Follow Up   Patient presents with  CRVO/BRVO.  In left eye.  Severity is moderate.  Duration of 5 weeks.  Since onset it is stable.  I, the attending physician,  performed the HPI with the patient and updated documentation appropriately.        Comments   Patient states vision the same OU.      Last edited by [name], MD on 08/01/2022  3:54 PM.    Pt is delayed to follow up from 4 weeks to 5 weeks, but she feels like vision is the same  Referring physician: [name], MD [place],  [place] [id number]  HISTORICAL INFORMATION:   Selected notes from the MEDICAL RECORD NUMBER Referred by Dr. [name]: 05.08.23 Ocular Hx- NPDR OS  Patient saw Dr. [name] Feb 2022.  Vision at that time was OD 20/20, OS 20/25    CURRENT MEDICATIONS: No current outpatient medications on file. (Ophthalmic Drugs)   No current facility-administered medications for this visit. (Ophthalmic Drugs)   Current Outpatient Medications (Other)  Medication Sig   albuterol (VENTOLIN HFA) 108 (90 Base) MCG/ACT inhaler Inhale 1-2 puffs into the lungs every 4 (four) hours as needed.   amLODipine (NORVASC) 5 MG tablet Take 2 tablets (10 mg total) by mouth daily.   atorvastatin (LIPITOR) 80 MG tablet Take 1 tablet (80 mg total) by mouth daily.   dapagliflozin propanediol (FARXIGA) 10 MG TABS tablet TAKE 1 TABLET (10 MG TOTAL) BY MOUTH DAILY.   dronedarone (MULTAQ) 400 MG tablet Take 1 tablet (400 mg total) by mouth 2 (two) times daily with a meal.   isosorbide-hydrALAZINE (BIDIL) 20-37.5 MG tablet TAKE 2 TABLETS BY MOUTH THREE TIMES DAILY   sacubitril-valsartan (ENTRESTO) 97-103 MG Take 1 tablet by mouth 2 (two) times daily.   No current  facility-administered medications for this visit. (Other)   REVIEW OF SYSTEMS: ROS   Positive for: Endocrine, Cardiovascular, Eyes, Respiratory Negative for: Constitutional, Gastrointestinal, Neurological, Skin, Genitourinary, Musculoskeletal, HENT, Psychiatric, Allergic/Imm, Heme/Lymph Last edited by Roselee Nova D, COT on 07/31/2022  9:08 AM.      ALLERGIES No Known Allergies  PAST MEDICAL HISTORY Past Medical History:  Diagnosis Date   CHF (congestive heart failure) ([place])    a. EF 45% by echo in 03/2020 with normal cors by cath   Diabetes mellitus without complication ([place])    Diarrhea    Heart disease    Herpes simplex infection    Hyperlipidemia    Hypertension    Past Surgical History:  Procedure Laterality Date   CESAREAN SECTION     CHOLECYSTECTOMY     COLONOSCOPY N/A 08/26/2014   Procedure: COLONOSCOPY;  Surgeon: [name], MD;  Location: [facility];  Service: Endoscopy;  Laterality: N/A;  9:30 AM - moved to 8:30 - Ginger to notify pt   LEFT HEART CATH AND CORONARY ANGIOGRAPHY N/A 04/14/2020   Procedure: LEFT HEART CATH AND CORONARY ANGIOGRAPHY;  Surgeon: [name], [name], MD;  Location: [facility];  Service: Cardiovascular;  Laterality: N/A;   RIGHT/LEFT HEART CATH AND CORONARY ANGIOGRAPHY N/A 02/19/2021   Procedure: RIGHT/LEFT HEART CATH AND CORONARY ANGIOGRAPHY;  Surgeon: [name],  [name], MD;  Location: [facility];  Service: Cardiovascular;  Laterality: N/A;   FAMILY HISTORY Family History  Problem Relation Age of Onset   Hypertension Mother    Diabetes Mother    Colon cancer Neg Hx    SOCIAL HISTORY Social History   Tobacco Use   Smoking status: Never   Smokeless tobacco: Never  Vaping Use   Vaping Use: Never used  Substance Use Topics   Alcohol use: No    Alcohol/week: 0.0 standard drinks of alcohol   Drug use: No       OPHTHALMIC EXAM:  Base Eye Exam     Visual Acuity (Snellen - Linear)       Right Left   Dist [place]  20/25 -1 20/80   Dist ph [place] 20/20 -2 NI         Tonometry (Tonopen, 9:14 AM)       Right Left   Pressure 20 19         Pupils       Dark Light Shape React APD   Right 2 1 Round Slow None   Left 2 1 Round Slow None         Visual Fields (Counting fingers)       Left Right    Full Full         Extraocular Movement       Right Left    Full, Ortho Full, Ortho         Neuro/Psych     Oriented x3: Yes   Mood/Affect: Normal         Dilation     Both eyes: 1.0% Mydriacyl, 2.5% Phenylephrine @ 9:14 AM           Slit Lamp and Fundus Exam     Slit Lamp Exam       Right Left   Lids/Lashes Dermatochalasis - upper lid, Meibomian gland dysfunction Dermatochalasis - upper lid, Meibomian gland dysfunction   Conjunctiva/Sclera melanosis melanosis   Cornea trace PEE trace PEE   Anterior Chamber deep, narrow temporal angle deep, narrow temporal angle   Iris Round and dilated, No NVI Round and dilated, No NVI   Lens 2+ Nuclear sclerosis, 2+ Cortical cataract 2+ Nuclear sclerosis, 2+ Cortical cataract   Anterior Vitreous Vitreous syneresis Vitreous syneresis         Fundus Exam       Right Left   Disc Pink and sharp, PPP Pink and sharp, PPP   C/D Ratio 0.6 0.6   Macula Flat, Good foveal reflex, +IRH inf nasal mac - fading Blunted foveal reflex, central edema, perifoveal exudates, cluster of IRH superior macula -- slightly improved   Vessels Attenuated, Tortuous Attenuated, Tortuous, Copper wiring, 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 Central Foveal Thickness: 230. Progression has been stable. Findings include normal foveal contour, no IRF, no SRF, vitreomacular adhesion .   Left Eye Quality was good. Central Foveal Thickness: 500. Progression has improved.  Findings include no SRF, abnormal foveal contour, subretinal hyper-reflective material, intraretinal hyper-reflective material, intraretinal fluid, vitreomacular adhesion (Mild interval improvement in IRF/IRHM -- still significant central thickening).   Notes *Images captured and stored on drive  Diagnosis /  Impression:  OD: NFP; no IRF/SRF  OS: BRVO w/ CME - Mild interval improvement in IRF/IRHM -- still significant central thickening  Clinical management:  See below  Abbreviations: NFP - Normal foveal profile. CME - cystoid macular edema. PED - pigment epithelial detachment. IRF - intraretinal fluid. SRF - subretinal fluid. EZ - ellipsoid zone. ERM - epiretinal membrane. ORA - outer retinal atrophy. ORT - outer retinal tubulation. SRHM - subretinal hyper-reflective material. IRHM - intraretinal hyper-reflective material      Intravitreal Injection, Pharmacologic Agent - OS - Left Eye       Time Out 07/31/2022. 9:53 AM. Confirmed correct patient, procedure, site, and patient consented.   Anesthesia Topical anesthesia was used. Anesthetic medications included Lidocaine 2%, Proparacaine 0.5%.   Procedure Preparation included 5% betadine to ocular surface, eyelid speculum. A (32g) needle was used.   Injection: 1.25 mg Bevacizumab 1.57m/0.05ml   Route: Intravitreal, Site: Left Eye   NDC:: 09470-962-83 Lot:: [id number] Expiration date: 09/03/2022   Post-op Post injection exam found visual acuity of at least counting fingers. The patient tolerated the procedure well. There were no complications. The patient received written and verbal post procedure care education. Post injection medications were not given.            ASSESSMENT/PLAN:    ICD-10-CM   1. Branch retinal vein occlusion of left eye with macular edema  H34.8320 OCT, Retina - OU - Both Eyes    Intravitreal Injection, Pharmacologic Agent - OS - Left Eye    Bevacizumab (AVASTIN) SOLN 1.25 mg    2. Essential hypertension   I10     3. Hypertensive retinopathy of both eyes  H35.033     4. Mild nonproliferative diabetic retinopathy of both eyes without macular edema associated with type 2 diabetes mellitus ([place]  EL46.5035    5. 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-related cataract of both eyes  H25.813      BRVO w/ CME OS - delayed f/u -- 5 wks instead of 4 - by history, likely onset around Dec 2022, but delayed presentation to May 2023 - FA 5.17.23 shows: Focal cluster of leakage superior mac and perifovea, ?macroaneurysm - s/p IVA OS #1 (05.17.23), #2 (06.14.23), #3 (08.02.23) - BCVA stable at 20/80 OS - OCT shows Mild interval improvement in IRF/IRHM -- still significant central thickening at 5 weeks - discussed possible resistance to IVA -- [facility] - recommend IVA OS #4 today, 09.06.23 w/ dec f/u back to 4 wks - pt wishes to proceed with injection   - RBA of procedure discussed, questions answered - informed consent obtained and signed - see procedure note  - F/U 4 weeks -- DFE/OCT/possible injection  2,3. Hypertensive retinopathy OU - discussed importance of tight BP control - monitor  4. 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   - The need for close monitoring of blood glucose, blood pressure, and serum lipids, avoiding cigarette or any type of tobacco, and the need for long term follow up was also discussed with patient. - exam shows rare MA OU  - FA (05.17.23) shows rare MA OU; no NV OU -- BRVO OS as above - OCT without diabetic macular edema, both eyes  - monitor  5. Mixed Cataract OU - The symptoms of cataract, surgical options, and treatments and risks were discussed with patient. -  discussed diagnosis and progression - monitor   Ophthalmic Meds Ordered this visit:  Meds ordered this encounter   Medications   Bevacizumab (AVASTIN) SOLN 1.25 mg     Return in about 4 weeks (around 08/28/2022) for f/u BRVO OS, DFE, OCT.  There are no Patient Instructions on file for this visit.   Explained the diagnoses, plan, and follow up with the patient and they expressed understanding.  Patient expressed understanding of the importance of proper follow up care.   This document serves as a record of services personally performed by Gardiner Sleeper, MD, PhD. It was created on their behalf by Orvan Falconer, an ophthalmic technician. The creation of this record is the provider's dictation and/or activities during the visit.    Electronically signed by: Orvan Falconer, OA, 08/01/22  3:54 PM  This document serves as a record of services personally performed by Gardiner Sleeper, MD, PhD. It was created on their behalf by San Jetty. Owens Shark, OA an ophthalmic technician. The creation of this record is the provider's dictation and/or activities during the visit.    Electronically signed by: San Jetty. Owens Shark, [place] 09.06.2023 3:54 PM  Gardiner Sleeper, M.D., Ph.D. Diseases & Surgery of the Retina and Vitreous [facility]  I have reviewed the above documentation for accuracy and completeness, and I agree with the above. Gardiner Sleeper, M.D., Ph.D. 08/01/22 4:02 PM  Abbreviations: M myopia (nearsighted); A astigmatism; H hyperopia (farsighted); P presbyopia; Mrx spectacle prescription;  CTL contact lenses; OD right eye; OS left eye; OU both eyes  XT exotropia; ET esotropia; PEK punctate epithelial keratitis; PEE punctate epithelial erosions; DES dry eye syndrome; MGD meibomian gland dysfunction; ATs artificial tears; PFAT's preservative free artificial tears; [place] nuclear sclerotic cataract; PSC posterior subcapsular cataract; ERM epi-retinal membrane; PVD posterior vitreous detachment; RD retinal detachment; DM diabetes mellitus; DR diabetic retinopathy; NPDR non-proliferative diabetic  retinopathy; PDR proliferative diabetic retinopathy; CSME clinically significant macular edema; DME diabetic macular edema; dbh dot blot hemorrhages; CWS cotton wool spot; POAG primary open angle glaucoma; C/D cup-to-disc ratio; HVF humphrey visual field; GVF goldmann visual field; OCT optical coherence tomography; IOP intraocular pressure; BRVO Branch retinal vein occlusion; CRVO central retinal vein occlusion; CRAO central retinal artery occlusion; BRAO branch retinal artery occlusion; RT retinal tear; SB scleral buckle; PPV pars plana vitrectomy; VH Vitreous hemorrhage; PRP panretinal laser photocoagulation; IVK intravitreal kenalog; VMT vitreomacular traction; MH Macular hole;  NVD neovascularization of the disc; NVE neovascularization elsewhere; AREDS age related eye disease study; ARMD age related macular degeneration; POAG primary open angle glaucoma; EBMD epithelial/anterior basement membrane dystrophy; ACIOL anterior chamber intraocular lens; IOL intraocular lens; PCIOL posterior chamber intraocular lens; Phaco/IOL phacoemulsification with intraocular lens placement; [place] photorefractive keratectomy; LASIK laser assisted in situ keratomileusis; HTN hypertension; DM diabetes mellitus; COPD chronic obstructive pulmonary disease

## 2022-07-31 ENCOUNTER — Encounter (INDEPENDENT_AMBULATORY_CARE_PROVIDER_SITE_OTHER): Payer: Self-pay | Admitting: Ophthalmology

## 2022-07-31 ENCOUNTER — Ambulatory Visit (INDEPENDENT_AMBULATORY_CARE_PROVIDER_SITE_OTHER): Payer: Medicare Other | Admitting: Ophthalmology

## 2022-07-31 DIAGNOSIS — H34832 Tributary (branch) retinal vein occlusion, left eye, with macular edema: Secondary | ICD-10-CM | POA: Diagnosis not present

## 2022-07-31 DIAGNOSIS — H35033 Hypertensive retinopathy, bilateral: Secondary | ICD-10-CM

## 2022-07-31 DIAGNOSIS — I10 Essential (primary) hypertension: Secondary | ICD-10-CM | POA: Diagnosis not present

## 2022-07-31 DIAGNOSIS — H25813 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-related cataract, bilateral: Secondary | ICD-10-CM

## 2022-07-31 DIAGNOSIS — E113293 Type 2 diabetes mellitus with mild nonproliferative diabetic retinopathy without macular edema, bilateral: Secondary | ICD-10-CM

## 2022-07-31 MED ORDER — BEVACIZUMAB CHEMO INJECTION 1.25MG/0.05ML SYRINGE FOR KALEIDOSCOPE
1.2500 mg | INTRAVITREAL | Status: AC | PRN
  Administered 2022-07-31: 1.25 mg via INTRAVITREAL

## 2022-08-01 DIAGNOSIS — E1121 Type 2 diabetes mellitus with diabetic nephropathy: Secondary | ICD-10-CM | POA: Diagnosis not present

## 2022-08-01 DIAGNOSIS — E785 Hyperlipidemia, unspecified: Secondary | ICD-10-CM | POA: Diagnosis not present

## 2022-08-01 DIAGNOSIS — I10 Essential (primary) hypertension: Secondary | ICD-10-CM | POA: Diagnosis not present

## 2022-08-01 DIAGNOSIS — Z23 Encounter for immunization: Secondary | ICD-10-CM | POA: Diagnosis not present

## 2022-08-01 DIAGNOSIS — I5042 Chronic combined systolic (congestive) and diastolic (congestive) heart failure: Secondary | ICD-10-CM | POA: Diagnosis not present

## 2022-08-13 ENCOUNTER — Other Ambulatory Visit (HOSPITAL_COMMUNITY): Payer: Self-pay | Admitting: Internal Medicine

## 2022-08-13 DIAGNOSIS — Z1231 Encounter for screening mammogram for malignant neoplasm of breast: Secondary | ICD-10-CM

## 2022-08-20 NOTE — Progress Notes (Signed)
[facility] Note  08/28/2022     CHIEF COMPLAINT Patient presents for Retina Follow Up   HISTORY OF PRESENT ILLNESS: Michelle Morrison is a 61 y.o. female who presents to the clinic today for:   HPI     Retina Follow Up   In left eye.  Severity is moderate.  Duration of 4 weeks.  Since onset it is stable.  I, the attending physician,  performed the HPI with the patient and updated documentation appropriately.        Comments   4 week Retina eval for Brvo os Patient states vision seems about the same      Last edited by [name], MD on 08/29/2022 11:45 PM.      Referring physician: [name], MD [place],  [place] [id number]  HISTORICAL INFORMATION:   Selected notes from the MEDICAL RECORD NUMBER Referred by Dr. [name]: 05.08.23 Ocular Hx- NPDR OS  Patient saw Dr. [name] Feb 2022.  Vision at that time was OD 20/20, OS 20/25    CURRENT MEDICATIONS: No current outpatient medications on file. (Ophthalmic Drugs)   No current facility-administered medications for this visit. (Ophthalmic Drugs)   Current Outpatient Medications (Other)  Medication Sig   albuterol (VENTOLIN HFA) 108 (90 Base) MCG/ACT inhaler Inhale 1-2 puffs into the lungs every 4 (four) hours as needed.   amLODipine (NORVASC) 5 MG tablet Take 2 tablets (10 mg total) by mouth daily.   atorvastatin (LIPITOR) 80 MG tablet Take 1 tablet (80 mg total) by mouth daily.   dapagliflozin propanediol (FARXIGA) 10 MG TABS tablet TAKE 1 TABLET (10 MG TOTAL) BY MOUTH DAILY.   dronedarone (MULTAQ) 400 MG tablet Take 1 tablet (400 mg total) by mouth 2 (two) times daily with a meal.   isosorbide-hydrALAZINE (BIDIL) 20-37.5 MG tablet TAKE 2 TABLETS BY MOUTH THREE TIMES DAILY   sacubitril-valsartan (ENTRESTO) 97-103 MG Take 1 tablet by mouth 2 (two) times daily.   No current facility-administered medications for this visit. (Other)   REVIEW OF  SYSTEMS: ROS   Positive for: Endocrine, Cardiovascular, Eyes, Respiratory Negative for: Constitutional, Gastrointestinal, Neurological, Skin, Genitourinary, Musculoskeletal, HENT, Psychiatric, Allergic/Imm, Heme/Lymph Last edited by Elmore Guise, COT on 08/28/2022  8:58 AM.     ALLERGIES No Known Allergies  PAST MEDICAL HISTORY Past Medical History:  Diagnosis Date   CHF (congestive heart failure) ([place])    a. EF 45% by echo in 03/2020 with normal cors by cath   Diabetes mellitus without complication ([place])    Diarrhea    Heart disease    Herpes simplex infection    Hyperlipidemia    Hypertension    Past Surgical History:  Procedure Laterality Date   CESAREAN SECTION     CHOLECYSTECTOMY     COLONOSCOPY N/A 08/26/2014   Procedure: COLONOSCOPY;  Surgeon: [name], MD;  Location: [facility];  Service: Endoscopy;  Laterality: N/A;  9:30 AM - moved to 8:30 - Ginger to notify pt   LEFT HEART CATH AND CORONARY ANGIOGRAPHY N/A 04/14/2020   Procedure: LEFT HEART CATH AND CORONARY ANGIOGRAPHY;  Surgeon: [name], [name], MD;  Location: [facility];  Service: Cardiovascular;  Laterality: N/A;   RIGHT/LEFT HEART CATH AND CORONARY ANGIOGRAPHY N/A 02/19/2021   Procedure: RIGHT/LEFT HEART CATH AND CORONARY ANGIOGRAPHY;  Surgeon: [name], MD;  Location: [facility];  Service: Cardiovascular;  Laterality: N/A;   FAMILY HISTORY  Family History  Problem Relation Age of Onset   Hypertension Mother    Diabetes Mother    Colon cancer Neg Hx    SOCIAL HISTORY Social History   Tobacco Use   Smoking status: Never   Smokeless tobacco: Never  Vaping Use   Vaping Use: Never used  Substance Use Topics   Alcohol use: No    Alcohol/week: 0.0 standard drinks of alcohol   Drug use: No       OPHTHALMIC EXAM:  Base Eye Exam     Visual Acuity (Snellen - Linear)       Right Left   Dist [place] 20/20-3 20/80-2   Dist ph [place]  20/80-1         Tonometry (Tonopen, 9:03  AM)       Right Left   Pressure 17 18         Pupils       Dark Light Shape React APD   Right 3 2 Round Slow None   Left 3 2 Round Slow None         Visual Fields (Counting fingers)       Left Right    Full Full         Extraocular Movement       Right Left    Full, Ortho Full, Ortho         Neuro/Psych     Oriented x3: Yes   Mood/Affect: Normal         Dilation     Both eyes: 1.0% Mydriacyl, 2.5% Phenylephrine @ 9:03 AM           Slit Lamp and Fundus Exam     Slit Lamp Exam       Right Left   Lids/Lashes Dermatochalasis - upper lid, Meibomian gland dysfunction Dermatochalasis - upper lid, Meibomian gland dysfunction   Conjunctiva/Sclera melanosis melanosis   Cornea trace PEE trace PEE   Anterior Chamber deep, narrow temporal angle deep, narrow temporal angle   Iris Round and dilated, No NVI Round and dilated, No NVI   Lens 2+ Nuclear sclerosis, 2+ Cortical cataract 2+ Nuclear sclerosis, 2+ Cortical cataract   Anterior Vitreous Vitreous syneresis Vitreous syneresis         Fundus Exam       Right Left   Disc Pink and sharp, PPP Pink and sharp, PPP   C/D Ratio 0.6 0.6   Macula Flat, Good foveal reflex, +IRH inf nasal mac - fading Blunted foveal reflex, central edema, perifoveal exudates, cluster of IRH superior macula --  improved   Vessels Attenuated, Tortuous Attenuated, Tortuous, Copper wiring, 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 Central Foveal Thickness: 230. Progression has been stable. Findings include normal foveal contour, no IRF, no SRF, vitreomacular adhesion .   Left Eye Quality was good. Central Foveal Thickness: 403. Progression has improved. Findings include no SRF, abnormal foveal contour, subretinal hyper-reflective material,  intraretinal hyper-reflective material, intraretinal fluid, vitreomacular adhesion (interval improvement in IRF/IRHM and foveal contour -- still significant central thickening).   Notes *Images captured and stored on drive  Diagnosis / Impression:  OD: NFP; no IRF/SRF  OS: BRVO w/ CME - interval improvement in IRF/IRHM and foveal contour --  still significant central thickening  Clinical management:  See below  Abbreviations: NFP - Normal foveal profile. CME - cystoid macular edema. PED - pigment epithelial detachment. IRF - intraretinal fluid. SRF - subretinal fluid. EZ - ellipsoid zone. ERM - epiretinal membrane. ORA - outer retinal atrophy. ORT - outer retinal tubulation. SRHM - subretinal hyper-reflective material. IRHM - intraretinal hyper-reflective material      Intravitreal Injection, Pharmacologic Agent - OS - Left Eye       Time Out 08/28/2022. 9:30 AM. Confirmed correct patient, procedure, site, and patient consented.   Anesthesia Topical anesthesia was used. Anesthetic medications included Lidocaine 2%, Proparacaine 0.5%.   Procedure Preparation included 5% betadine to ocular surface, eyelid speculum. A (32g) needle was used.   Injection: 1.25 mg Bevacizumab 1.40m/0.05ml   Route: Intravitreal, Site: Left Eye   NDC:: 95638-756-43 Lot:: [id number] Expiration date: 10/09/2022   Post-op Post injection exam found visual acuity of at least counting fingers. The patient tolerated the procedure well. There were no complications. The patient received written and verbal post procedure care education. Post injection medications were not given.            ASSESSMENT/PLAN:    ICD-10-CM   1. Branch retinal vein occlusion of left eye with macular edema  H34.8320 OCT, Retina - OU - Both Eyes    Intravitreal Injection, Pharmacologic Agent - OS - Left Eye    Bevacizumab (AVASTIN) SOLN 1.25 mg    2. Essential hypertension  I10     3. Hypertensive retinopathy of both eyes   H35.033     4. Mild nonproliferative diabetic retinopathy of both eyes without macular edema associated with type 2 diabetes mellitus ([place]  EC16.6063    5. 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-related cataract of both eyes  H25.813      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, ?macroaneurysm - s/p IVA OS #1 (05.17.23), #2 (06.14.23), #3 (08.02.23), #4 (09.06.23) - BCVA stable at 20/80 OS - OCT shows Mild interval improvement in IRF/IRHM at 4 wks - discussed possible resistance to IVA -- prior auth for Eylea obtained - recommend IVA OS #5 today, 10.04.23 w/ f/u in 4 wks- pt wishes to proceed with injection   - RBA of procedure discussed, questions answered - informed consent obtained and signed - see procedure note - F/U 4 weeks -- DFE/OCT/possible injection IVA vs IVE  2,3. Hypertensive retinopathy OU - discussed importance of tight BP control - monitor  4. 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   - The need for close monitoring of blood glucose, blood pressure, and serum lipids, avoiding cigarette or any type of tobacco, and the need for long term follow up was also discussed with patient. - exam shows rare MA OU  - FA (05.17.23) shows rare MA OU; no NV OU -- BRVO OS as above - OCT without diabetic macular edema, both eyes  - monitor  5. Mixed Cataract OU - The symptoms of cataract, surgical options, and treatments and risks were discussed with patient. - discussed diagnosis and progression - monitor   Ophthalmic Meds Ordered this visit:  Meds ordered this encounter  Medications   Bevacizumab (AVASTIN) SOLN 1.25 mg     Return in about 4  weeks (around 09/25/2022) for BRVO OS - DFE, OCT, Possible  Injxn.  There are no Patient Instructions on file for this visit.   Explained the diagnoses, plan, and follow up with the patient and they expressed understanding.  Patient expressed understanding of the importance of proper follow up care.   This document serves as a record of services personally performed by Gardiner Sleeper, MD, PhD. It was created on their behalf by Orvan Falconer, an ophthalmic technician. The creation of this record is the provider's dictation and/or activities during the visit.    Electronically signed by: Orvan Falconer, OA, 08/29/22  11:54 PM  Gardiner Sleeper, M.D., Ph.D. Diseases & Surgery of the Retina and Vitreous [facility]  I have reviewed the above documentation for accuracy and completeness, and I agree with the above. Gardiner Sleeper, M.D., Ph.D. 08/30/22 12:08 AM  Abbreviations: M myopia (nearsighted); A astigmatism; H hyperopia (farsighted); P presbyopia; Mrx spectacle prescription;  CTL contact lenses; OD right eye; OS left eye; OU both eyes  XT exotropia; ET esotropia; PEK punctate epithelial keratitis; PEE punctate epithelial erosions; DES dry eye syndrome; MGD meibomian gland dysfunction; ATs artificial tears; PFAT's preservative free artificial tears; [place] nuclear sclerotic cataract; PSC posterior subcapsular cataract; ERM epi-retinal membrane; PVD posterior vitreous detachment; RD retinal detachment; DM diabetes mellitus; DR diabetic retinopathy; NPDR non-proliferative diabetic retinopathy; PDR proliferative diabetic retinopathy; CSME clinically significant macular edema; DME diabetic macular edema; dbh dot blot hemorrhages; CWS cotton wool spot; POAG primary open angle glaucoma; C/D cup-to-disc ratio; HVF humphrey visual field; GVF goldmann visual field; OCT optical coherence tomography; IOP intraocular pressure; BRVO Branch retinal vein occlusion; CRVO central retinal vein occlusion; CRAO central retinal artery occlusion; BRAO  branch retinal artery occlusion; RT retinal tear; SB scleral buckle; PPV pars plana vitrectomy; VH Vitreous hemorrhage; PRP panretinal laser photocoagulation; IVK intravitreal kenalog; VMT vitreomacular traction; MH Macular hole;  NVD neovascularization of the disc; NVE neovascularization elsewhere; AREDS age related eye disease study; ARMD age related macular degeneration; POAG primary open angle glaucoma; EBMD epithelial/anterior basement membrane dystrophy; ACIOL anterior chamber intraocular lens; IOL intraocular lens; PCIOL posterior chamber intraocular lens; Phaco/IOL phacoemulsification with intraocular lens placement; [place] photorefractive keratectomy; LASIK laser assisted in situ keratomileusis; HTN hypertension; DM diabetes mellitus; COPD chronic obstructive pulmonary disease

## 2022-08-26 ENCOUNTER — Ambulatory Visit (HOSPITAL_COMMUNITY): Payer: Medicare Other

## 2022-08-28 ENCOUNTER — Encounter (INDEPENDENT_AMBULATORY_CARE_PROVIDER_SITE_OTHER): Payer: Self-pay | Admitting: Ophthalmology

## 2022-08-28 ENCOUNTER — Ambulatory Visit (INDEPENDENT_AMBULATORY_CARE_PROVIDER_SITE_OTHER): Payer: Medicare Other | Admitting: Ophthalmology

## 2022-08-28 DIAGNOSIS — I10 Essential (primary) hypertension: Secondary | ICD-10-CM

## 2022-08-28 DIAGNOSIS — H25813 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-related cataract, bilateral: Secondary | ICD-10-CM | POA: Diagnosis not present

## 2022-08-28 DIAGNOSIS — E113293 Type 2 diabetes mellitus with mild nonproliferative diabetic retinopathy without macular edema, bilateral: Secondary | ICD-10-CM

## 2022-08-28 DIAGNOSIS — H35033 Hypertensive retinopathy, bilateral: Secondary | ICD-10-CM

## 2022-08-28 DIAGNOSIS — H34832 Tributary (branch) retinal vein occlusion, left eye, with macular edema: Secondary | ICD-10-CM

## 2022-08-28 MED ORDER — BEVACIZUMAB CHEMO INJECTION 1.25MG/0.05ML SYRINGE FOR KALEIDOSCOPE
1.2500 mg | INTRAVITREAL | Status: AC | PRN
  Administered 2022-08-28: 1.25 mg via INTRAVITREAL

## 2022-08-29 NOTE — Progress Notes (Incomplete)
[facility] Note  08/28/2022     CHIEF COMPLAINT Patient presents for Retina Follow Up   HISTORY OF PRESENT ILLNESS: Michelle Morrison is a 61 y.o. female who presents to the clinic today for:   HPI     Retina Follow Up   In left eye.  Severity is moderate.  Duration of 4 weeks.  Since onset it is stable.  I, the attending physician,  performed the HPI with the patient and updated documentation appropriately.        Comments   4 week Retina eval for Brvo os Patient states vision seems about the same      Last edited by [name], MD on 08/29/2022 11:45 PM.      Referring physician: [name], MD [place],  [place] [address]  HISTORICAL INFORMATION:   Selected notes from the MEDICAL RECORD NUMBER Referred by Dr. [name]: 05.08.23 Ocular Hx- NPDR OS  Patient saw Dr. [name] Feb 2022.  Vision at that time was OD 20/20, OS 20/25    CURRENT MEDICATIONS: No current outpatient medications on file. (Ophthalmic Drugs)   No current facility-administered medications for this visit. (Ophthalmic Drugs)   Current Outpatient Medications (Other)  Medication Sig  . albuterol (VENTOLIN HFA) 108 (90 Base) MCG/ACT inhaler Inhale 1-2 puffs into the lungs every 4 (four) hours as needed.  Marland Kitchen amLODipine (NORVASC) 5 MG tablet Take 2 tablets (10 mg total) by mouth daily.  Marland Kitchen atorvastatin (LIPITOR) 80 MG tablet Take 1 tablet (80 mg total) by mouth daily.  . dapagliflozin propanediol (FARXIGA) 10 MG TABS tablet TAKE 1 TABLET (10 MG TOTAL) BY MOUTH DAILY.  Marland Kitchen dronedarone (MULTAQ) 400 MG tablet Take 1 tablet (400 mg total) by mouth 2 (two) times daily with a meal.  . isosorbide-hydrALAZINE (BIDIL) 20-37.5 MG tablet TAKE 2 TABLETS BY MOUTH THREE TIMES DAILY  . sacubitril-valsartan (ENTRESTO) 97-103 MG Take 1 tablet by mouth 2 (two) times daily.   No current facility-administered medications for this visit. (Other)   REVIEW OF  SYSTEMS: ROS   Positive for: Endocrine, Cardiovascular, Eyes, Respiratory Negative for: Constitutional, Gastrointestinal, Neurological, Skin, Genitourinary, Musculoskeletal, HENT, Psychiatric, Allergic/Imm, Heme/Lymph Last edited by Elmore Guise, COT on 08/28/2022  8:58 AM.     ALLERGIES No Known Allergies  PAST MEDICAL HISTORY Past Medical History:  Diagnosis Date  . CHF (congestive heart failure) ([place])    a. EF 45% by echo in 03/2020 with normal cors by cath  . Diabetes mellitus without complication ([place])   . Diarrhea   . Heart disease   . Herpes simplex infection   . Hyperlipidemia   . Hypertension    Past Surgical History:  Procedure Laterality Date  . CESAREAN SECTION    . CHOLECYSTECTOMY    . COLONOSCOPY N/A 08/26/2014   Procedure: COLONOSCOPY;  Surgeon: [name], MD;  [facility];  Service: Endoscopy;  Laterality: N/A;  9:30 AM - moved to 8:30 - Ginger to notify pt  . LEFT HEART CATH AND CORONARY ANGIOGRAPHY N/A 04/14/2020   Procedure: LEFT HEART CATH AND CORONARY ANGIOGRAPHY;  Surgeon: [name], [name], MD;  Location: [facility];  Service: Cardiovascular;  Laterality: N/A;  . RIGHT/LEFT HEART CATH AND CORONARY ANGIOGRAPHY N/A 02/19/2021   Procedure: RIGHT/LEFT HEART CATH AND CORONARY ANGIOGRAPHY;  Surgeon: [name], MD;  Location: [facility];  Service: Cardiovascular;  Laterality: N/A;   FAMILY HISTORY  Family History  Problem Relation Age of Onset  . Hypertension Mother   . Diabetes Mother   . Colon cancer Neg Hx    SOCIAL HISTORY Social History   Tobacco Use  . Smoking status: Never  . Smokeless tobacco: Never  Vaping Use  . Vaping Use: Never used  Substance Use Topics  . Alcohol use: No    Alcohol/week: 0.0 standard drinks of alcohol  . Drug use: No       OPHTHALMIC EXAM:  Base Eye Exam     Visual Acuity (Snellen - Linear)       Right Left   Dist [place] 20/20-3 20/80-2   Dist ph   20/80-1          Tonometry (Tonopen, 9:03 AM)       Right Left   Pressure 17 18         Pupils       Dark Light Shape React APD   Right 3 2 Round Slow None   Left 3 2 Round Slow None         Visual Fields (Counting fingers)       Left Right    Full Full         Extraocular Movement       Right Left    Full, Ortho Full, Ortho         Neuro/Psych     Oriented x3: Yes   Mood/Affect: Normal         Dilation     Both eyes: 1.0% Mydriacyl, 2.5% Phenylephrine @ 9:03 AM           Slit Lamp and Fundus Exam     Slit Lamp Exam       Right Left   Lids/Lashes Dermatochalasis - upper lid, Meibomian gland dysfunction Dermatochalasis - upper lid, Meibomian gland dysfunction   Conjunctiva/Sclera melanosis melanosis   Cornea trace PEE trace PEE   Anterior Chamber deep, narrow temporal angle deep, narrow temporal angle   Iris Round and dilated, No NVI Round and dilated, No NVI   Lens 2+ Nuclear sclerosis, 2+ Cortical cataract 2+ Nuclear sclerosis, 2+ Cortical cataract   Anterior Vitreous Vitreous syneresis Vitreous syneresis         Fundus Exam       Right Left   Disc Pink and sharp, PPP Pink and sharp, PPP   C/D Ratio 0.6 0.6   Macula Flat, Good foveal reflex, +IRH inf nasal mac - fading Blunted foveal reflex, central edema, perifoveal exudates, cluster of IRH superior macula --  improved   Vessels Attenuated, Tortuous Attenuated, Tortuous, Copper wiring, 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 Central Foveal Thickness: 230. Progression has been stable. Findings include normal foveal contour, no IRF, no SRF, vitreomacular adhesion .   Left Eye Quality was good. Central Foveal Thickness: 403. Progression has improved. Findings include no SRF, abnormal foveal contour, subretinal  hyper-reflective material, intraretinal hyper-reflective material, intraretinal fluid, vitreomacular adhesion (interval improvement in IRF/IRHM and foveal contour -- still significant central thickening).   Notes *Images captured and stored on drive  Diagnosis / Impression:  OD: NFP; no IRF/SRF  OS: BRVO w/ CME - interval improvement in IRF/IRHM and foveal contour --  still significant central thickening  Clinical management:  See below  Abbreviations: NFP - Normal foveal profile. CME - cystoid macular edema. PED - pigment epithelial detachment. IRF - intraretinal fluid. SRF - subretinal fluid. EZ - ellipsoid zone. ERM - epiretinal membrane. ORA - outer retinal atrophy. ORT - outer retinal tubulation. SRHM - subretinal hyper-reflective material. IRHM - intraretinal hyper-reflective material      Intravitreal Injection, Pharmacologic Agent - OS - Left Eye       Time Out 08/28/2022. 9:30 AM. Confirmed correct patient, procedure, site, and patient consented.   Anesthesia Topical anesthesia was used. Anesthetic medications included Lidocaine 2%, Proparacaine 0.5%.   Procedure Preparation included 5% betadine to ocular surface, eyelid speculum. A (32g) needle was used.   Injection: 1.25 mg Bevacizumab 1.47m/0.05ml   Route: Intravitreal, Site: Left Eye   NDC:: 14431-540-08 Lot:: [id number] Expiration date: 10/09/2022   Post-op Post injection exam found visual acuity of at least counting fingers. The patient tolerated the procedure well. There were no complications. The patient received written and verbal post procedure care education. Post injection medications were not given.            ASSESSMENT/PLAN:    ICD-10-CM   1. Branch retinal vein occlusion of left eye with macular edema  H34.8320 OCT, Retina - OU - Both Eyes    Intravitreal Injection, Pharmacologic Agent - OS - Left Eye    Bevacizumab (AVASTIN) SOLN 1.25 mg    2. Essential hypertension  I10     3. Hypertensive  retinopathy of both eyes  H35.033     4. Mild nonproliferative diabetic retinopathy of both eyes without macular edema associated with type 2 diabetes mellitus ([place]  ED32.6712    5. 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-related cataract of both eyes  H25.813      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, ?macroaneurysm - s/p IVA OS #1 (05.17.23), #2 (06.14.23), #3 (08.02.23), #4 (09.06.23) - BCVA stable at 20/80 OS - OCT shows Mild interval improvement in IRF/IRHM -- still significant central thickening at 5 weeks - discussed possible resistance to IVA -- [facility] recommend IVA OS #5 today, 10.04.23 w/ dec f/u back to 4 wks; patient has been approved for eylea - pt wishes to proceed with injection   - RBA of procedure discussed, questions answered - informed consent obtained and signed - see procedure note - F/U 4 weeks -- DFE/OCT/possible injection  2,3. Hypertensive retinopathy OU - discussed importance of tight BP control - monitor  4. 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   - The need for close monitoring of blood glucose, blood pressure, and serum lipids, avoiding cigarette or any type of tobacco, and the need for long term follow up was also discussed with patient. - exam shows rare MA OU  - FA (05.17.23) shows rare MA OU; no NV OU -- BRVO OS as above - OCT without diabetic macular edema, both eyes  - monitor  5. Mixed Cataract OU - The symptoms of cataract, surgical options, and treatments and risks were discussed with patient. - discussed diagnosis and progression - monitor   Ophthalmic Meds Ordered this visit:  Meds ordered this encounter  Medications  .  Bevacizumab (AVASTIN) SOLN 1.25 mg     Return in about 4 weeks (around 09/25/2022) for BRVO OS - DFE, OCT, Possible Injxn.  There are no Patient Instructions on file for this visit.   Explained the diagnoses, plan, and follow up with the patient and they expressed understanding.  Patient expressed understanding of the importance of proper follow up care.   This document serves as a record of services personally performed by Gardiner Sleeper, MD, PhD. It was created on their behalf by Orvan Falconer, an ophthalmic technician. The creation of this record is the provider's dictation and/or activities during the visit.    Electronically signed by: Orvan Falconer, OA, 08/29/22  11:54 PM   Gardiner Sleeper, M.D., Ph.D. Diseases & Surgery of the Retina and Vitreous [facility]: M myopia (nearsighted); A astigmatism; H hyperopia (farsighted); P presbyopia; Mrx spectacle prescription;  CTL contact lenses; OD right eye; OS left eye; OU both eyes  XT exotropia; ET esotropia; PEK punctate epithelial keratitis; PEE punctate epithelial erosions; DES dry eye syndrome; MGD meibomian gland dysfunction; ATs artificial tears; PFAT's preservative free artificial tears; [place] nuclear sclerotic cataract; PSC posterior subcapsular cataract; ERM epi-retinal membrane; PVD posterior vitreous detachment; RD retinal detachment; DM diabetes mellitus; DR diabetic retinopathy; NPDR non-proliferative diabetic retinopathy; PDR proliferative diabetic retinopathy; CSME clinically significant macular edema; DME diabetic macular edema; dbh dot blot hemorrhages; CWS cotton wool spot; POAG primary open angle glaucoma; C/D cup-to-disc ratio; HVF humphrey visual field; GVF goldmann visual field; OCT optical coherence tomography; IOP intraocular pressure; BRVO Branch retinal vein occlusion; CRVO central retinal vein occlusion; CRAO central retinal artery occlusion; BRAO branch retinal artery  occlusion; RT retinal tear; SB scleral buckle; PPV pars plana vitrectomy; VH Vitreous hemorrhage; PRP panretinal laser photocoagulation; IVK intravitreal kenalog; VMT vitreomacular traction; MH Macular hole;  NVD neovascularization of the disc; NVE neovascularization elsewhere; AREDS age related eye disease study; ARMD age related macular degeneration; POAG primary open angle glaucoma; EBMD epithelial/anterior basement membrane dystrophy; ACIOL anterior chamber intraocular lens; IOL intraocular lens; PCIOL posterior chamber intraocular lens; Phaco/IOL phacoemulsification with intraocular lens placement; [place] photorefractive keratectomy; LASIK laser assisted in situ keratomileusis; HTN hypertension; DM diabetes mellitus; COPD chronic obstructive pulmonary disease

## 2022-08-31 DIAGNOSIS — I10 Essential (primary) hypertension: Secondary | ICD-10-CM | POA: Diagnosis not present

## 2022-08-31 DIAGNOSIS — I5042 Chronic combined systolic (congestive) and diastolic (congestive) heart failure: Secondary | ICD-10-CM | POA: Diagnosis not present

## 2022-09-05 ENCOUNTER — Ambulatory Visit (HOSPITAL_COMMUNITY)
Admission: RE | Admit: 2022-09-05 | Discharge: 2022-09-05 | Disposition: A | Discharge status: Home / Self Care | Payer: Medicare Other | Source: Ambulatory Visit | Attending: Internal Medicine | Admitting: Internal Medicine

## 2022-09-05 DIAGNOSIS — Z1231 Encounter for screening mammogram for malignant neoplasm of breast: Secondary | ICD-10-CM | POA: Insufficient documentation

## 2022-09-17 NOTE — Progress Notes (Shared)
[facility] Note  09/25/2022     CHIEF COMPLAINT Patient presents for No chief complaint on file.   HISTORY OF PRESENT ILLNESS: Michelle Morrison is a 61 y.o. female who presents to the clinic today for:      Referring physician: [name], MD [place],  [place] [address]  HISTORICAL INFORMATION:   Selected notes from the MEDICAL RECORD NUMBER Referred by Dr. [name]: 05.08.23 Ocular Hx- NPDR OS  Patient saw Dr. [name] Feb 2022.  Vision at that time was OD 20/20, OS 20/25    CURRENT MEDICATIONS: No current outpatient medications on file. (Ophthalmic Drugs)   No current facility-administered medications for this visit. (Ophthalmic Drugs)   Current Outpatient Medications (Other)  Medication Sig   albuterol (VENTOLIN HFA) 108 (90 Base) MCG/ACT inhaler Inhale 1-2 puffs into the lungs every 4 (four) hours as needed.   amLODipine (NORVASC) 5 MG tablet Take 2 tablets (10 mg total) by mouth daily.   atorvastatin (LIPITOR) 80 MG tablet Take 1 tablet (80 mg total) by mouth daily.   dapagliflozin propanediol (FARXIGA) 10 MG TABS tablet TAKE 1 TABLET (10 MG TOTAL) BY MOUTH DAILY.   dronedarone (MULTAQ) 400 MG tablet Take 1 tablet (400 mg total) by mouth 2 (two) times daily with a meal.   isosorbide-hydrALAZINE (BIDIL) 20-37.5 MG tablet TAKE 2 TABLETS BY MOUTH THREE TIMES DAILY   sacubitril-valsartan (ENTRESTO) 97-103 MG Take 1 tablet by mouth 2 (two) times daily.   No current facility-administered medications for this visit. (Other)   REVIEW OF SYSTEMS:   ALLERGIES No Known Allergies  PAST MEDICAL HISTORY Past Medical History:  Diagnosis Date   CHF (congestive heart failure) ([place])    a. EF 45% by echo in 03/2020 with normal cors by cath   Diabetes mellitus without complication ([place])    Diarrhea    Heart disease    Herpes simplex infection    Hyperlipidemia    Hypertension    Past Surgical History:   Procedure Laterality Date   CESAREAN SECTION     CHOLECYSTECTOMY     COLONOSCOPY N/A 08/26/2014   Procedure: COLONOSCOPY;  Surgeon: [name], MD;  [facility];  Service: Endoscopy;  Laterality: N/A;  9:30 AM - moved to 8:30 - Ginger to notify pt   LEFT HEART CATH AND CORONARY ANGIOGRAPHY N/A 04/14/2020   Procedure: LEFT HEART CATH AND CORONARY ANGIOGRAPHY;  Surgeon: [name], [name], MD;  Location: [facility];  Service: Cardiovascular;  Laterality: N/A;   RIGHT/LEFT HEART CATH AND CORONARY ANGIOGRAPHY N/A 02/19/2021   Procedure: RIGHT/LEFT HEART CATH AND CORONARY ANGIOGRAPHY;  Surgeon: [name], MD;  Location: [facility];  Service: Cardiovascular;  Laterality: N/A;   FAMILY HISTORY Family History  Problem Relation Age of Onset   Hypertension Mother    Diabetes Mother    Colon cancer Neg Hx    SOCIAL HISTORY Social History   Tobacco Use   Smoking status: Never   Smokeless tobacco: Never  Vaping Use   Vaping Use: Never used  Substance Use Topics   Alcohol use: No    Alcohol/week: 0.0 standard drinks of alcohol   Drug use: No       OPHTHALMIC EXAM:  Not recorded    IMAGING AND PROCEDURES  Imaging and Procedures for 09/25/2022          ASSESSMENT/PLAN:  No diagnosis found.  BRVO w/ CME  OS - by history, likely onset around Dec 2022, but delayed presentation to May 2023 - FA 5.17.23 shows: Focal cluster of leakage superior mac and perifovea, ?macroaneurysm - s/p IVA OS #1 (05.17.23), #2 (06.14.23), #3 (08.02.23), #4 (09.06.23), #5 (10.04.23) - BCVA stable at 20/80 OS - OCT shows Mild interval improvement in IRF/IRHM at 4 wks - discussed possible resistance to IVA -- prior auth for Eylea obtained - recommend IVA OS #6 today, 11.01.23 w/ f/u in 4 wks- pt wishes to proceed with injection   - RBA of procedure discussed, questions answered - informed consent obtained and signed - see procedure note  - F/U 4 weeks --  DFE/OCT/possible injection IVA vs IVE  2,3. Hypertensive retinopathy OU - discussed importance of tight BP control - monitor  4. 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   - The need for close monitoring of blood glucose, blood pressure, and serum lipids, avoiding cigarette or any type of tobacco, and the need for long term follow up was also discussed with patient. - exam shows rare MA OU  - FA (05.17.23) shows rare MA OU; no NV OU -- BRVO OS as above - OCT without diabetic macular edema, both eyes  - monitor  5. Mixed Cataract OU - The symptoms of cataract, surgical options, and treatments and risks were discussed with patient. - discussed diagnosis and progression - monitor   Ophthalmic Meds Ordered this visit:  No orders of the defined types were placed in this encounter.    No follow-ups on file.  There are no Patient Instructions on file for this visit.   Explained the diagnoses, plan, and follow up with the patient and they expressed understanding.  Patient expressed understanding of the importance of proper follow up care.   This document serves as a record of services personally performed by Gardiner Sleeper, MD, PhD. It was created on their behalf by Orvan Falconer, an ophthalmic technician. The creation of this record is the provider's dictation and/or activities during the visit.    Electronically signed by: Orvan Falconer, OA, 09/17/22  10:20 AM  Gardiner Sleeper, M.D., Ph.D. Diseases & Surgery of the Retina and Vitreous [facility]: M myopia (nearsighted); A astigmatism; H hyperopia (farsighted); P presbyopia; Mrx spectacle prescription;  CTL contact lenses; OD right eye; OS left eye; OU both eyes  XT exotropia; ET esotropia; PEK punctate epithelial keratitis; PEE  punctate epithelial erosions; DES dry eye syndrome; MGD meibomian gland dysfunction; ATs artificial tears; PFAT's preservative free artificial tears; [place] nuclear sclerotic cataract; PSC posterior subcapsular cataract; ERM epi-retinal membrane; PVD posterior vitreous detachment; RD retinal detachment; DM diabetes mellitus; DR diabetic retinopathy; NPDR non-proliferative diabetic retinopathy; PDR proliferative diabetic retinopathy; CSME clinically significant macular edema; DME diabetic macular edema; dbh dot blot hemorrhages; CWS cotton wool spot; POAG primary open angle glaucoma; C/D cup-to-disc ratio; HVF humphrey visual field; GVF goldmann visual field; OCT optical coherence tomography; IOP intraocular pressure; BRVO Branch retinal vein occlusion; CRVO central retinal vein occlusion; CRAO central retinal artery occlusion; BRAO branch retinal artery occlusion; RT retinal tear; SB scleral buckle; PPV pars plana vitrectomy; VH Vitreous hemorrhage; PRP panretinal laser photocoagulation; IVK intravitreal kenalog; VMT vitreomacular traction; MH Macular hole;  NVD neovascularization of the disc; NVE neovascularization elsewhere; AREDS age related eye disease study; ARMD age related macular degeneration; POAG primary  open angle glaucoma; EBMD epithelial/anterior basement membrane dystrophy; ACIOL anterior chamber intraocular lens; IOL intraocular lens; PCIOL posterior chamber intraocular lens; Phaco/IOL phacoemulsification with intraocular lens placement; [place] photorefractive keratectomy; LASIK laser assisted in situ keratomileusis; HTN hypertension; DM diabetes mellitus; COPD chronic obstructive pulmonary disease

## 2022-09-25 ENCOUNTER — Encounter (INDEPENDENT_AMBULATORY_CARE_PROVIDER_SITE_OTHER): Payer: Medicare Other | Admitting: Ophthalmology

## 2022-09-25 DIAGNOSIS — H34832 Tributary (branch) retinal vein occlusion, left eye, with macular edema: Secondary | ICD-10-CM

## 2022-09-25 DIAGNOSIS — I10 Essential (primary) hypertension: Secondary | ICD-10-CM

## 2022-09-25 DIAGNOSIS — H35033 Hypertensive retinopathy, bilateral: Secondary | ICD-10-CM

## 2022-09-25 DIAGNOSIS — H25813 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-related cataract, bilateral: Secondary | ICD-10-CM

## 2022-09-25 DIAGNOSIS — E113293 Type 2 diabetes mellitus with mild nonproliferative diabetic retinopathy without macular edema, bilateral: Secondary | ICD-10-CM

## 2022-09-27 NOTE — Progress Notes (Incomplete)
[facility] Note  09/30/2022     CHIEF COMPLAINT Patient presents for No chief complaint on file.   HISTORY OF PRESENT ILLNESS: Michelle Morrison is a 61 y.o. female who presents to the clinic today for:      Referring physician: [name], MD [place],  [place] [address]  HISTORICAL INFORMATION:   Selected notes from the MEDICAL RECORD NUMBER Referred by Dr. [name]: 05.08.23 Ocular Hx- NPDR OS  Patient saw Dr. [name] Feb 2022.  Vision at that time was OD 20/20, OS 20/25    CURRENT MEDICATIONS: No current outpatient medications on file. (Ophthalmic Drugs)   No current facility-administered medications for this visit. (Ophthalmic Drugs)   Current Outpatient Medications (Other)  Medication Sig   albuterol (VENTOLIN HFA) 108 (90 Base) MCG/ACT inhaler Inhale 1-2 puffs into the lungs every 4 (four) hours as needed.   amLODipine (NORVASC) 5 MG tablet Take 2 tablets (10 mg total) by mouth daily.   atorvastatin (LIPITOR) 80 MG tablet Take 1 tablet (80 mg total) by mouth daily.   dapagliflozin propanediol (FARXIGA) 10 MG TABS tablet TAKE 1 TABLET (10 MG TOTAL) BY MOUTH DAILY.   dronedarone (MULTAQ) 400 MG tablet Take 1 tablet (400 mg total) by mouth 2 (two) times daily with a meal.   isosorbide-hydrALAZINE (BIDIL) 20-37.5 MG tablet TAKE 2 TABLETS BY MOUTH THREE TIMES DAILY   sacubitril-valsartan (ENTRESTO) 97-103 MG Take 1 tablet by mouth 2 (two) times daily.   No current facility-administered medications for this visit. (Other)   REVIEW OF SYSTEMS:   ALLERGIES No Known Allergies  PAST MEDICAL HISTORY Past Medical History:  Diagnosis Date   CHF (congestive heart failure) ([place])    a. EF 45% by echo in 03/2020 with normal cors by cath   Diabetes mellitus without complication ([place])    Diarrhea    Heart disease    Herpes simplex infection    Hyperlipidemia    Hypertension    Past Surgical History:   Procedure Laterality Date   CESAREAN SECTION     CHOLECYSTECTOMY     COLONOSCOPY N/A 08/26/2014   Procedure: COLONOSCOPY;  Surgeon: [name], MD;  [facility];  Service: Endoscopy;  Laterality: N/A;  9:30 AM - moved to 8:30 - Ginger to notify pt   LEFT HEART CATH AND CORONARY ANGIOGRAPHY N/A 04/14/2020   Procedure: LEFT HEART CATH AND CORONARY ANGIOGRAPHY;  Surgeon: [name], [name], MD;  Location: [facility];  Service: Cardiovascular;  Laterality: N/A;   RIGHT/LEFT HEART CATH AND CORONARY ANGIOGRAPHY N/A 02/19/2021   Procedure: RIGHT/LEFT HEART CATH AND CORONARY ANGIOGRAPHY;  Surgeon: [name], MD;  Location: [facility];  Service: Cardiovascular;  Laterality: N/A;   FAMILY HISTORY Family History  Problem Relation Age of Onset   Hypertension Mother    Diabetes Mother    Colon cancer Neg Hx    SOCIAL HISTORY Social History   Tobacco Use   Smoking status: Never   Smokeless tobacco: Never  Vaping Use   Vaping Use: Never used  Substance Use Topics   Alcohol use: No    Alcohol/week: 0.0 standard drinks of alcohol   Drug use: No       OPHTHALMIC EXAM:  Not recorded    IMAGING AND PROCEDURES  Imaging and Procedures for 09/30/2022          ASSESSMENT/PLAN:    ICD-10-CM   1. 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 Essential hypertension  I10     3. Hypertensive retinopathy of both eyes  H35.033     4. Mild nonproliferative diabetic retinopathy of both eyes without macular edema associated with type 2 diabetes mellitus ([place])  A21.3086     5. 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-related cataract of both eyes  H25.813       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, ?macroaneurysm - s/p IVA OS #1 (05.17.23), #2 (06.14.23), #3 (08.02.23), #4 (09.06.23), #5 (10.04.23) - BCVA stable  at 20/80 OS - OCT shows Mild interval improvement in IRF/IRHM at 4 wks - discussed possible resistance to IVA -- prior auth for Eylea obtained - recommend IVA OS #6 today, 11.06.23 w/ f/u in 4 wks- pt wishes to proceed with injection   - RBA of procedure discussed, questions answered - informed consent obtained and signed - see procedure note - F/U 4 weeks -- DFE/OCT/possible injection IVA vs IVE  2,3. Hypertensive retinopathy OU - discussed importance of tight BP control - monitor  4. 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   - The need for close monitoring of blood glucose, blood pressure, and serum lipids, avoiding cigarette or any type of tobacco, and the need for long term follow up was also discussed with patient. - exam shows rare MA OU  - FA (05.17.23) shows rare MA OU; no NV OU -- BRVO OS as above - OCT without diabetic macular edema, both eyes  - monitor  5. Mixed Cataract OU - The symptoms of cataract, surgical options, and treatments and risks were discussed with patient. - discussed diagnosis and progression - monitor  Ophthalmic Meds Ordered this visit:  No orders of the defined types were placed in this encounter.    No follow-ups on file.  There are no Patient Instructions on file for this visit.   Explained the diagnoses, plan, and follow up with the patient and they expressed understanding.  Patient expressed understanding of the importance of proper follow up care.   This document serves as a record of services personally performed by Gardiner Sleeper, MD, PhD. It was created on their behalf by Roselee Nova, COMT. The creation of this record is the provider's dictation and/or activities during the visit.  Electronically signed by: Roselee Nova, COMT 09/27/22 5:28 PM   Gardiner Sleeper, M.D.,  Ph.D. Diseases & Surgery of the Retina and Vitreous [facility]: M myopia (nearsighted); A astigmatism; H hyperopia (farsighted); P presbyopia; Mrx spectacle prescription;  CTL contact lenses; OD right eye; OS left eye; OU both eyes  XT exotropia; ET esotropia; PEK punctate epithelial keratitis; PEE punctate epithelial erosions; DES dry eye syndrome; MGD meibomian gland dysfunction; ATs artificial tears; PFAT's preservative free artificial tears; [place] nuclear sclerotic cataract; PSC posterior subcapsular cataract; ERM epi-retinal membrane; PVD posterior vitreous detachment; RD retinal detachment; DM diabetes mellitus; DR diabetic retinopathy; NPDR non-proliferative diabetic retinopathy; PDR proliferative diabetic retinopathy; CSME clinically significant macular edema; DME diabetic macular edema; dbh dot blot hemorrhages; CWS cotton wool spot; POAG primary open angle glaucoma; C/D cup-to-disc ratio; HVF humphrey visual field; GVF goldmann visual field;  OCT optical coherence tomography; IOP intraocular pressure; BRVO Branch retinal vein occlusion; CRVO central retinal vein occlusion; CRAO central retinal artery occlusion; BRAO branch retinal artery occlusion; RT retinal tear; SB scleral buckle; PPV pars plana vitrectomy; VH Vitreous hemorrhage; PRP panretinal laser photocoagulation; IVK intravitreal kenalog; VMT vitreomacular traction; MH Macular hole;  NVD neovascularization of the disc; NVE neovascularization elsewhere; AREDS age related eye disease study; ARMD age related macular degeneration; POAG primary open angle glaucoma; EBMD epithelial/anterior basement membrane dystrophy; ACIOL anterior chamber intraocular lens; IOL intraocular lens; PCIOL posterior chamber intraocular lens; Phaco/IOL phacoemulsification with intraocular lens placement; [place] photorefractive keratectomy; LASIK laser assisted in situ keratomileusis; HTN hypertension; DM diabetes mellitus; COPD  chronic obstructive pulmonary disease

## 2022-09-30 ENCOUNTER — Encounter (INDEPENDENT_AMBULATORY_CARE_PROVIDER_SITE_OTHER): Payer: Medicare Other | Admitting: Ophthalmology

## 2022-09-30 DIAGNOSIS — I10 Essential (primary) hypertension: Secondary | ICD-10-CM

## 2022-09-30 DIAGNOSIS — H35033 Hypertensive retinopathy, bilateral: Secondary | ICD-10-CM

## 2022-09-30 DIAGNOSIS — H25813 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-related cataract, bilateral: Secondary | ICD-10-CM

## 2022-09-30 DIAGNOSIS — H34832 Tributary (branch) retinal vein occlusion, left eye, with macular edema: Secondary | ICD-10-CM

## 2022-09-30 DIAGNOSIS — E113293 Type 2 diabetes mellitus with mild nonproliferative diabetic retinopathy without macular edema, bilateral: Secondary | ICD-10-CM

## 2022-09-30 NOTE — Progress Notes (Signed)
[facility] Note  10/01/2022     CHIEF COMPLAINT Patient presents for Retina Follow Up   HISTORY OF PRESENT ILLNESS: Michelle Morrison is a 61 y.o. female who presents to the clinic today for:   HPI     Retina Follow Up   Patient presents with  CRVO/BRVO.  In left eye.  This started 4 weeks ago.  I, the attending physician,  performed the HPI with the patient and updated documentation appropriately.        Comments   Patient here for 4 weeks retina follow up for BRVO OS. Patient states vision so far good. Can see a little bit better. No eye pain.       Last edited by [name], MD on 10/01/2022 10:58 PM.    Pt feels like vision has improved   Referring physician: [name], MD [place],  [place] [id number]  HISTORICAL INFORMATION:   Selected notes from the MEDICAL RECORD NUMBER Referred by Dr. [name]: 05.08.23 Ocular Hx- NPDR OS  Patient saw Dr. [name] Feb 2022.  Vision at that time was OD 20/20, OS 20/25    CURRENT MEDICATIONS: No current outpatient medications on file. (Ophthalmic Drugs)   No current facility-administered medications for this visit. (Ophthalmic Drugs)   Current Outpatient Medications (Other)  Medication Sig   albuterol (VENTOLIN HFA) 108 (90 Base) MCG/ACT inhaler Inhale 1-2 puffs into the lungs every 4 (four) hours as needed.   amLODipine (NORVASC) 5 MG tablet Take 2 tablets (10 mg total) by mouth daily.   atorvastatin (LIPITOR) 80 MG tablet Take 1 tablet (80 mg total) by mouth daily.   dapagliflozin propanediol (FARXIGA) 10 MG TABS tablet TAKE 1 TABLET (10 MG TOTAL) BY MOUTH DAILY.   dronedarone (MULTAQ) 400 MG tablet Take 1 tablet (400 mg total) by mouth 2 (two) times daily with a meal.   isosorbide-hydrALAZINE (BIDIL) 20-37.5 MG tablet TAKE 2 TABLETS BY MOUTH THREE TIMES DAILY   sacubitril-valsartan (ENTRESTO) 97-103 MG Take 1 tablet by mouth 2 (two) times daily.   No  current facility-administered medications for this visit. (Other)   REVIEW OF SYSTEMS: ROS   Positive for: Endocrine, Cardiovascular, Eyes, Respiratory Negative for: Constitutional, Gastrointestinal, Neurological, Skin, Genitourinary, Musculoskeletal, HENT, Psychiatric, Allergic/Imm, Heme/Lymph Last edited by Theodore Demark, COA on 10/01/2022  3:21 PM.     ALLERGIES No Known Allergies  PAST MEDICAL HISTORY Past Medical History:  Diagnosis Date   CHF (congestive heart failure) ([place])    a. EF 45% by echo in 03/2020 with normal cors by cath   Diabetes mellitus without complication ([place])    Diarrhea    Heart disease    Herpes simplex infection    Hyperlipidemia    Hypertension    Past Surgical History:  Procedure Laterality Date   CESAREAN SECTION     CHOLECYSTECTOMY     COLONOSCOPY N/A 08/26/2014   Procedure: COLONOSCOPY;  Surgeon: [name], MD;  [facility];  Service: Endoscopy;  Laterality: N/A;  9:30 AM - moved to 8:30 - Ginger to notify pt   LEFT HEART CATH AND CORONARY ANGIOGRAPHY N/A 04/14/2020   Procedure: LEFT HEART CATH AND CORONARY ANGIOGRAPHY;  Surgeon: [name], [name], MD;  Location: [place] [facility];  Service: Cardiovascular;  Laterality: N/A;   RIGHT/LEFT HEART CATH AND CORONARY ANGIOGRAPHY N/A 02/19/2021   Procedure: RIGHT/LEFT HEART CATH AND CORONARY ANGIOGRAPHY;  Surgeon: [name], MD;  Location: [facility];  Service: Cardiovascular;  Laterality: N/A;   FAMILY HISTORY Family History  Problem Relation Age of Onset   Hypertension Mother    Diabetes Mother    Colon cancer Neg Hx    SOCIAL HISTORY Social History   Tobacco Use   Smoking status: Never   Smokeless tobacco: Never  Vaping Use   Vaping Use: Never used  Substance Use Topics   Alcohol use: No    Alcohol/week: 0.0 standard drinks of alcohol   Drug use: No       OPHTHALMIC EXAM:  Base Eye Exam     Visual Acuity (Snellen - Linear)       Right Left    Dist [facility] 20/25 +2 20/70   Dist ph [place] 20/20 -1 20/60         Tonometry (Tonopen, 3:19 PM)       Right Left   Pressure 11 10         Pupils       Dark Light Shape React APD   Right 3 2 Round Brisk None   Left 3 2 Round Brisk None         Visual Fields (Counting fingers)       Left Right    Full Full         Extraocular Movement       Right Left    Full, Ortho Full, Ortho         Neuro/Psych     Oriented x3: Yes   Mood/Affect: Normal         Dilation     Both eyes: 1.0% Mydriacyl, 2.5% Phenylephrine @ 3:19 PM           Slit Lamp and Fundus Exam     Slit Lamp Exam       Right Left   Lids/Lashes Dermatochalasis - upper lid, Meibomian gland dysfunction Dermatochalasis - upper lid, Meibomian gland dysfunction   Conjunctiva/Sclera melanosis melanosis   Cornea trace PEE trace PEE   Anterior Chamber deep, narrow temporal angle deep, narrow temporal angle   Iris Round and dilated, No NVI Round and dilated, No NVI   Lens 2+ Nuclear sclerosis, 2+ Cortical cataract 2+ Nuclear sclerosis, 2+ Cortical cataract   Anterior Vitreous Vitreous syneresis Vitreous syneresis         Fundus Exam       Right Left   Disc Pink and sharp, PPP Pink and sharp, PPP   C/D Ratio 0.6 0.6   Macula Flat, Good foveal reflex, +IRH inf nasal mac - fading Blunted foveal reflex, central edema, perifoveal exudates, cluster of IRH superior macula --  all improved   Vessels Attenuated, Tortuous Attenuated, Tortuous, Copper wiring, old BRVO with macroaneurysm superior macula   Periphery Attached, rare MA Attached, rare MA           IMAGING AND PROCEDURES  Imaging and Procedures for 10/01/2022  OCT, Retina - OU - Both Eyes       Right Eye Quality was good. Central Foveal Thickness: 229. Progression has been stable. Findings include normal foveal contour, no IRF, no SRF, vitreomacular adhesion .   Left Eye Quality was good. Central Foveal Thickness: 315. Progression has  improved. Findings include no SRF, abnormal foveal contour, subretinal hyper-reflective material, intraretinal hyper-reflective material, intraretinal fluid, vitreomacular adhesion (interval improvement in IRF/IRHM and foveal contour greatest superior fovea and macula).   Notes *Images captured and stored on drive  Diagnosis / Impression:  OD: NFP; no IRF/SRF  OS: BRVO w/ CME - interval improvement in IRF/IRHM and foveal contour greatest superior fovea and macula  Clinical management:  See below  Abbreviations: NFP - Normal foveal profile. CME - cystoid macular edema. PED - pigment epithelial detachment. IRF - intraretinal fluid. SRF - subretinal fluid. EZ - ellipsoid zone. ERM - epiretinal membrane. ORA - outer retinal atrophy. ORT - outer retinal tubulation. SRHM - subretinal hyper-reflective material. IRHM - intraretinal hyper-reflective material      Intravitreal Injection, Pharmacologic Agent - OS - Left Eye       Time Out 10/01/2022. 3:45 PM. Confirmed correct patient, procedure, site, and patient consented.   Anesthesia Topical anesthesia was used. Anesthetic medications included Lidocaine 2%, Proparacaine 0.5%.   Procedure Preparation included 5% betadine to ocular surface, eyelid speculum. A (32g) needle was used.   Injection: 1.25 mg Bevacizumab 1.21m/0.05ml   Route: Intravitreal, Site: Left Eye   NDC:: 89211-941-74 Lot:: [id number] Expiration date: 10/23/2022   Post-op Post injection exam found visual acuity of at least counting fingers. The patient tolerated the procedure well. There were no complications. The patient received written and verbal post procedure care education. Post injection medications were not given.            ASSESSMENT/PLAN:    ICD-10-CM   1. 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 Essential hypertension  I10     3. Hypertensive retinopathy of both eyes  H35.033     4. Mild nonproliferative diabetic retinopathy of both eyes without macular edema associated with type 2 diabetes mellitus ([place]  EE56.3149    5. 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-related cataract of both eyes  H25.813      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, ?macroaneurysm - s/p IVA OS #1 (05.17.23), #2 (06.14.23), #3 (08.02.23), #4 (09.06.23), #5 (10.04.23) - BCVA 20/60 from 20/80 OS - OCT shows interval improvement in IRF/IRHM at 4 wks - discussed possible resistance to IVA -- prior auth for Eylea obtained - recommend IVA OS #6 today, 11.06.23 w/ f/u in 4 wks - pt wishes to proceed with injection   - RBA of procedure discussed, questions answered - informed consent obtained and signed - see procedure note - F/U 4 weeks -- DFE/OCT/possible injection IVA vs IVE  2,3. Hypertensive retinopathy OU - discussed importance of tight BP control - monitor  4. 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   - The need for close monitoring of blood glucose, blood pressure, and serum lipids, avoiding cigarette or any type of tobacco, and the need for long term follow up was also discussed with patient. - exam shows rare MA OU  - FA (05.17.23) shows rare MA OU; no NV OU -- BRVO OS as above - OCT without diabetic macular edema, both eyes  - monitor  5. Mixed Cataract OU - The symptoms of cataract, surgical options, and treatments and risks were discussed with patient. - discussed diagnosis and progression - monitor   Ophthalmic Meds Ordered this visit:  Meds  ordered this encounter  Medications   Bevacizumab (AVASTIN) SOLN  1.25 mg     Return in about 4 weeks (around 10/29/2022) for f/u BRVO OS, DFE, OCT.  There are no Patient Instructions on file for this visit.   Explained the diagnoses, plan, and follow up with the patient and they expressed understanding.  Patient expressed understanding of the importance of proper follow up care.   This document serves as a record of services personally performed by Gardiner Sleeper, MD, PhD. It was created on their behalf by San Jetty. Owens Shark, OA an ophthalmic technician. The creation of this record is the provider's dictation and/or activities during the visit.    Electronically signed by: San Jetty. Owens Shark, [place] 11.06.2023 11:00 PM  Gardiner Sleeper, M.D., Ph.D. Diseases & Surgery of the Retina and Vitreous [facility]  I have reviewed the above documentation for accuracy and completeness, and I agree with the above. Gardiner Sleeper, M.D., Ph.D. 10/01/22 11:02 PM  Abbreviations: M myopia (nearsighted); A astigmatism; H hyperopia (farsighted); P presbyopia; Mrx spectacle prescription;  CTL contact lenses; OD right eye; OS left eye; OU both eyes  XT exotropia; ET esotropia; PEK punctate epithelial keratitis; PEE punctate epithelial erosions; DES dry eye syndrome; MGD meibomian gland dysfunction; ATs artificial tears; PFAT's preservative free artificial tears; [place] nuclear sclerotic cataract; PSC posterior subcapsular cataract; ERM epi-retinal membrane; PVD posterior vitreous detachment; RD retinal detachment; DM diabetes mellitus; DR diabetic retinopathy; NPDR non-proliferative diabetic retinopathy; PDR proliferative diabetic retinopathy; CSME clinically significant macular edema; DME diabetic macular edema; dbh dot blot hemorrhages; CWS cotton wool spot; POAG primary open angle glaucoma; C/D cup-to-disc ratio; HVF humphrey visual field; GVF goldmann visual field; OCT optical coherence tomography; IOP intraocular pressure; BRVO Branch retinal vein occlusion; CRVO  central retinal vein occlusion; CRAO central retinal artery occlusion; BRAO branch retinal artery occlusion; RT retinal tear; SB scleral buckle; PPV pars plana vitrectomy; VH Vitreous hemorrhage; PRP panretinal laser photocoagulation; IVK intravitreal kenalog; VMT vitreomacular traction; MH Macular hole;  NVD neovascularization of the disc; NVE neovascularization elsewhere; AREDS age related eye disease study; ARMD age related macular degeneration; POAG primary open angle glaucoma; EBMD epithelial/anterior basement membrane dystrophy; ACIOL anterior chamber intraocular lens; IOL intraocular lens; PCIOL posterior chamber intraocular lens; Phaco/IOL phacoemulsification with intraocular lens placement; [place] photorefractive keratectomy; LASIK laser assisted in situ keratomileusis; HTN hypertension; DM diabetes mellitus; COPD chronic obstructive pulmonary disease

## 2022-10-01 ENCOUNTER — Ambulatory Visit (INDEPENDENT_AMBULATORY_CARE_PROVIDER_SITE_OTHER): Payer: Medicare Other | Admitting: Ophthalmology

## 2022-10-01 ENCOUNTER — Encounter (INDEPENDENT_AMBULATORY_CARE_PROVIDER_SITE_OTHER): Payer: Self-pay | Admitting: Ophthalmology

## 2022-10-01 DIAGNOSIS — H25813 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-related cataract, bilateral: Secondary | ICD-10-CM | POA: Diagnosis not present

## 2022-10-01 DIAGNOSIS — H35033 Hypertensive retinopathy, bilateral: Secondary | ICD-10-CM

## 2022-10-01 DIAGNOSIS — I10 Essential (primary) hypertension: Secondary | ICD-10-CM

## 2022-10-01 DIAGNOSIS — H34832 Tributary (branch) retinal vein occlusion, left eye, with macular edema: Secondary | ICD-10-CM | POA: Diagnosis not present

## 2022-10-01 DIAGNOSIS — E113293 Type 2 diabetes mellitus with mild nonproliferative diabetic retinopathy without macular edema, bilateral: Secondary | ICD-10-CM | POA: Diagnosis not present

## 2022-10-01 DIAGNOSIS — E785 Hyperlipidemia, unspecified: Secondary | ICD-10-CM | POA: Diagnosis not present

## 2022-10-01 MED ORDER — BEVACIZUMAB CHEMO INJECTION 1.25MG/0.05ML SYRINGE FOR KALEIDOSCOPE
1.2500 mg | INTRAVITREAL | Status: AC | PRN
  Administered 2022-10-01: 1.25 mg via INTRAVITREAL

## 2022-10-21 NOTE — Progress Notes (Shared)
[facility] Note  10/29/2022     CHIEF COMPLAINT Patient presents for No chief complaint on file.   HISTORY OF PRESENT ILLNESS: Michelle Morrison is a 61 y.o. female who presents to the clinic today for:    Pt feels like vision has improved   Referring physician: [name], MD [place],  [place] [address]  HISTORICAL INFORMATION:   Selected notes from the MEDICAL RECORD NUMBER Referred by Dr. [name]: 05.08.23 Ocular Hx- NPDR OS  Patient saw Dr. [name] Feb 2022.  Vision at that time was OD 20/20, OS 20/25    CURRENT MEDICATIONS: No current outpatient medications on file. (Ophthalmic Drugs)   No current facility-administered medications for this visit. (Ophthalmic Drugs)   Current Outpatient Medications (Other)  Medication Sig   albuterol (VENTOLIN HFA) 108 (90 Base) MCG/ACT inhaler Inhale 1-2 puffs into the lungs every 4 (four) hours as needed.   amLODipine (NORVASC) 5 MG tablet Take 2 tablets (10 mg total) by mouth daily.   atorvastatin (LIPITOR) 80 MG tablet Take 1 tablet (80 mg total) by mouth daily.   dapagliflozin propanediol (FARXIGA) 10 MG TABS tablet TAKE 1 TABLET (10 MG TOTAL) BY MOUTH DAILY.   dronedarone (MULTAQ) 400 MG tablet Take 1 tablet (400 mg total) by mouth 2 (two) times daily with a meal.   isosorbide-hydrALAZINE (BIDIL) 20-37.5 MG tablet TAKE 2 TABLETS BY MOUTH THREE TIMES DAILY   sacubitril-valsartan (ENTRESTO) 97-103 MG Take 1 tablet by mouth 2 (two) times daily.   No current facility-administered medications for this visit. (Other)   REVIEW OF SYSTEMS:   ALLERGIES No Known Allergies  PAST MEDICAL HISTORY Past Medical History:  Diagnosis Date   CHF (congestive heart failure) ([place])    a. EF 45% by echo in 03/2020 with normal cors by cath   Diabetes mellitus without complication ([place])    Diarrhea    Heart disease    Herpes simplex infection    Hyperlipidemia    Hypertension     Past Surgical History:  Procedure Laterality Date   CESAREAN SECTION     CHOLECYSTECTOMY     COLONOSCOPY N/A 08/26/2014   Procedure: COLONOSCOPY;  Surgeon: [name], MD;  [facility];  Service: Endoscopy;  Laterality: N/A;  9:30 AM - moved to 8:30 - Ginger to notify pt   LEFT HEART CATH AND CORONARY ANGIOGRAPHY N/A 04/14/2020   Procedure: LEFT HEART CATH AND CORONARY ANGIOGRAPHY;  Surgeon: [name], [name], MD;  Location: [facility];  Service: Cardiovascular;  Laterality: N/A;   RIGHT/LEFT HEART CATH AND CORONARY ANGIOGRAPHY N/A 02/19/2021   Procedure: RIGHT/LEFT HEART CATH AND CORONARY ANGIOGRAPHY;  Surgeon: [name], MD;  Location: [facility];  Service: Cardiovascular;  Laterality: N/A;   FAMILY HISTORY Family History  Problem Relation Age of Onset   Hypertension Mother    Diabetes Mother    Colon cancer Neg Hx    SOCIAL HISTORY Social History   Tobacco Use   Smoking status: Never   Smokeless tobacco: Never  Vaping Use   Vaping Use: Never used  Substance Use Topics   Alcohol use: No    Alcohol/week: 0.0 standard drinks of alcohol   Drug use: No       OPHTHALMIC EXAM:  Not recorded    IMAGING AND PROCEDURES  Imaging and Procedures for 10/29/2022          ASSESSMENT/PLAN:  ICD-10-CM   1. Branch retinal vein occlusion of left eye with macular edema  H34.8320     2. Essential hypertension  I10     3. Hypertensive retinopathy of both eyes  H35.033     4. Mild nonproliferative diabetic retinopathy of both eyes without macular edema associated with type 2 diabetes mellitus ([place])  A41.6606     5. 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-related cataract of both eyes  H25.813       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, ?macroaneurysm - s/p IVA OS #1 (05.17.23), #2 (06.14.23), #3 (08.02.23), #4 (09.06.23), #5  (10.04.23), #6 (11.06.23) - BCVA 20/60 from 20/80 OS - OCT shows interval improvement in IRF/IRHM at 4 wks - discussed possible resistance to IVA -- prior auth for Eylea obtained - recommend IVA OS #7 today, 12.05.23 w/ f/u in 4 wks - pt wishes to proceed with injection   - RBA of procedure discussed, questions answered - informed consent obtained and signed - see procedure note - F/U 4 weeks -- DFE/OCT/possible injection IVA vs IVE  2,3. Hypertensive retinopathy OU - discussed importance of tight BP control - monitor  4. 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   - The need for close monitoring of blood glucose, blood pressure, and serum lipids, avoiding cigarette or any type of tobacco, and the need for long term follow up was also discussed with patient. - exam shows rare MA OU  - FA (05.17.23) shows rare MA OU; no NV OU -- BRVO OS as above - OCT without diabetic macular edema, both eyes  - monitor  5. Mixed Cataract OU - The symptoms of cataract, surgical options, and treatments and risks were discussed with patient. - discussed diagnosis and progression - monitor   Ophthalmic Meds Ordered this visit:  No orders of the defined types were placed in this encounter.    No follow-ups on file.  There are no Patient Instructions on file for this visit.   Explained the diagnoses, plan, and follow up with the patient and they expressed understanding.  Patient expressed understanding of the importance of proper follow up care.   This document serves as a record of services personally performed by Gardiner Sleeper, MD, PhD. It was created on their behalf by Renaldo Reel, [place] an ophthalmic technician. The creation of this record is the provider's dictation and/or activities during the visit.    Electronically signed by:   Renaldo Reel, COT  11.27.23 10:07 AM   Gardiner Sleeper, M.D., Ph.D. Diseases & Surgery of the Retina and Vitreous [facility]: M myopia (nearsighted); A astigmatism; H hyperopia (farsighted); P presbyopia; Mrx spectacle prescription;  CTL contact lenses; OD right eye; OS left eye; OU both eyes  XT exotropia; ET esotropia; PEK punctate epithelial keratitis; PEE punctate epithelial erosions; DES dry eye syndrome; MGD meibomian gland dysfunction; ATs artificial tears; PFAT's preservative free artificial tears; [place] nuclear sclerotic cataract; PSC posterior subcapsular cataract; ERM epi-retinal membrane; PVD posterior vitreous detachment; RD retinal detachment; DM diabetes mellitus; DR diabetic retinopathy; NPDR non-proliferative diabetic retinopathy; PDR proliferative diabetic retinopathy; CSME clinically significant macular edema; DME diabetic macular edema; dbh dot blot hemorrhages; CWS cotton wool spot; POAG primary  open angle glaucoma; C/D cup-to-disc ratio; HVF humphrey visual field; GVF goldmann visual field; OCT optical coherence tomography; IOP intraocular pressure; BRVO Branch retinal vein occlusion; CRVO central retinal vein occlusion; CRAO central retinal artery occlusion; BRAO branch retinal artery occlusion; RT retinal tear; SB scleral buckle; PPV pars plana vitrectomy; VH Vitreous hemorrhage; PRP panretinal laser photocoagulation; IVK intravitreal kenalog; VMT vitreomacular traction; MH Macular hole;  NVD neovascularization of the disc; NVE neovascularization elsewhere; AREDS age related eye disease study; ARMD age related macular degeneration; POAG primary open angle glaucoma; EBMD epithelial/anterior basement membrane dystrophy; ACIOL anterior chamber intraocular lens; IOL intraocular lens; PCIOL posterior chamber intraocular lens; Phaco/IOL phacoemulsification with intraocular lens placement; [place] photorefractive keratectomy; LASIK laser assisted in  situ keratomileusis; HTN hypertension; DM diabetes mellitus; COPD chronic obstructive pulmonary disease

## 2022-10-28 NOTE — Progress Notes (Signed)
[facility] Note  11/01/2022     CHIEF COMPLAINT Patient presents for Retina Follow Up   HISTORY OF PRESENT ILLNESS: Michelle Morrison is a 61 y.o. female who presents to the clinic today for:   HPI     Retina Follow Up   Patient presents with  CRVO/BRVO.  In left eye.  This started 4 weeks ago.  Duration of 4.  I, the attending physician,  performed the HPI with the patient and updated documentation appropriately.        Comments   4 week retina follow up BRVO OS pt reports no vision changes noticed       Last edited by [name], MD on 11/03/2022  7:58 PM.     Pt feels    Referring physician: [name], MD [place],  [place] [address]  HISTORICAL INFORMATION:   Selected notes from the MEDICAL RECORD NUMBER Referred by Dr. [name]: 05.08.23 Ocular Hx- NPDR OS  Patient saw Dr. [name] Feb 2022.  Vision at that time was OD 20/20, OS 20/25    CURRENT MEDICATIONS: No current outpatient medications on file. (Ophthalmic Drugs)   No current facility-administered medications for this visit. (Ophthalmic Drugs)   Current Outpatient Medications (Other)  Medication Sig   albuterol (VENTOLIN HFA) 108 (90 Base) MCG/ACT inhaler Inhale 1-2 puffs into the lungs every 4 (four) hours as needed.   amLODipine (NORVASC) 5 MG tablet Take 2 tablets (10 mg total) by mouth daily.   atorvastatin (LIPITOR) 80 MG tablet Take 1 tablet (80 mg total) by mouth daily.   dapagliflozin propanediol (FARXIGA) 10 MG TABS tablet TAKE 1 TABLET (10 MG TOTAL) BY MOUTH DAILY.   dronedarone (MULTAQ) 400 MG tablet Take 1 tablet (400 mg total) by mouth 2 (two) times daily with a meal.   isosorbide-hydrALAZINE (BIDIL) 20-37.5 MG tablet TAKE 2 TABLETS BY MOUTH THREE TIMES DAILY   sacubitril-valsartan (ENTRESTO) 97-103 MG Take 1 tablet by mouth 2 (two) times daily.   No current facility-administered medications for this visit. (Other)    REVIEW OF SYSTEMS:   ALLERGIES No Known Allergies  PAST MEDICAL HISTORY Past Medical History:  Diagnosis Date   CHF (congestive heart failure) ([place])    a. EF 45% by echo in 03/2020 with normal cors by cath   Diabetes mellitus without complication ([place])    Diarrhea    Heart disease    Herpes simplex infection    Hyperlipidemia    Hypertension    Past Surgical History:  Procedure Laterality Date   CESAREAN SECTION     CHOLECYSTECTOMY     COLONOSCOPY N/A 08/26/2014   Procedure: COLONOSCOPY;  Surgeon: [name], MD;  [facility];  Service: Endoscopy;  Laterality: N/A;  9:30 AM - moved to 8:30 - Ginger to notify pt   LEFT HEART CATH AND CORONARY ANGIOGRAPHY N/A 04/14/2020   Procedure: LEFT HEART CATH AND CORONARY ANGIOGRAPHY;  Surgeon: [name], [name], MD;  Location: [facility];  Service: Cardiovascular;  Laterality: N/A;   RIGHT/LEFT HEART CATH AND CORONARY ANGIOGRAPHY N/A 02/19/2021   Procedure: RIGHT/LEFT HEART CATH AND CORONARY ANGIOGRAPHY;  Surgeon: [name], MD;  Location: [facility];  Service: Cardiovascular;  Laterality: N/A;   FAMILY HISTORY Family History  Problem Relation Age of Onset   Hypertension Mother    Diabetes Mother    Colon cancer Neg Hx    SOCIAL HISTORY  Social History   Tobacco Use   Smoking status: Never   Smokeless tobacco: Never  Vaping Use   Vaping Use: Never used  Substance Use Topics   Alcohol use: No    Alcohol/week: 0.0 standard drinks of alcohol   Drug use: No       OPHTHALMIC EXAM:  Base Eye Exam     Visual Acuity (Snellen - Linear)       Right Left   Dist [place] 20/25 -2 20/70 -2   Dist ph  20/20 -2 NI         Tonometry (Tonopen, 9:00 AM)       Right Left   Pressure 16 16  Squeezing         Pupils       Pupils Dark Light Shape React APD   Right PERRL 3 2 Round Brisk None   Left PERRL 3 2 Round Brisk None         Visual Fields       Left Right    Full Full          Extraocular Movement       Right Left    Full, Ortho Full, Ortho         Neuro/Psych     Oriented x3: Yes   Mood/Affect: Normal         Dilation     Both eyes: 2.5% Phenylephrine @ 9:01 AM           Slit Lamp and Fundus Exam     Slit Lamp Exam       Right Left   Lids/Lashes Dermatochalasis - upper lid, Meibomian gland dysfunction Dermatochalasis - upper lid, Meibomian gland dysfunction   Conjunctiva/Sclera melanosis melanosis   Cornea trace PEE trace PEE   Anterior Chamber deep, narrow temporal angle deep, narrow temporal angle   Iris Round and dilated, No NVI Round and dilated, No NVI   Lens 2+ Nuclear sclerosis, 2+ Cortical cataract 2+ Nuclear sclerosis, 2+ Cortical cataract   Anterior Vitreous Vitreous syneresis Vitreous syneresis         Fundus Exam       Right Left   Disc Pink and sharp, PPP Pink and sharp, PPP   C/D Ratio 0.6 0.6   Macula Flat, Good foveal reflex, +IRH inf nasal mac - fading Blunted foveal reflex, central edema -- increased, perifoveal exudates, cluster of IRH superior macula -- improving   Vessels Attenuated, Tortuous Attenuated, Tortuous, Copper wiring, old BRVO with macroaneurysm superior macula   Periphery Attached, rare MA Attached, rare MA           IMAGING AND PROCEDURES  Imaging and Procedures for 11/01/2022  OCT, Retina - OU - Both Eyes       Right Eye Quality was good. Central Foveal Thickness: 232. Progression has been stable. Findings include normal foveal contour, no IRF, no SRF, vitreomacular adhesion .   Left Eye Quality was good. Central Foveal Thickness: 335. Progression has worsened. Findings include no SRF, abnormal foveal contour, subretinal hyper-reflective material, intraretinal hyper-reflective material, intraretinal fluid, vitreomacular adhesion (Persistent IRF/IRHM greatest superior fovea and macula -- slightly increased).   Notes *Images captured and stored on drive  Diagnosis / Impression:   OD: NFP; no IRF/SRF  OS: BRVO w/ CME - Persistent IRF/IRHM greatest superior fovea and macula -- slightly increased  Clinical management:  See below  Abbreviations: NFP - Normal foveal profile. CME - cystoid macular edema. PED - pigment epithelial detachment.  IRF - intraretinal fluid. SRF - subretinal fluid. EZ - ellipsoid zone. ERM - epiretinal membrane. ORA - outer retinal atrophy. ORT - outer retinal tubulation. SRHM - subretinal hyper-reflective material. IRHM - intraretinal hyper-reflective material      Intravitreal Injection, Pharmacologic Agent - OS - Left Eye       Time Out 11/01/2022. 9:53 AM. Confirmed correct patient, procedure, site, and patient consented.   Anesthesia Topical anesthesia was used. Anesthetic medications included Lidocaine 2%, Proparacaine 0.5%.   Procedure Preparation included 5% betadine to ocular surface, eyelid speculum. A (32g) needle was used.   Injection: 2 mg aflibercept 2 MG/0.05ML   Route: Intravitreal, Site: Left Eye   NDC: [id number], Lot: [id number], Expiration date: 01/23/2024, Waste: 0 mL   Post-op Post injection exam found visual acuity of at least counting fingers. The patient tolerated the procedure well. There were no complications. The patient received written and verbal post procedure care education. Post injection medications were not given.            ASSESSMENT/PLAN:    ICD-10-CM   1. Branch retinal vein occlusion of left eye with macular edema  H34.8320 OCT, Retina - OU - Both Eyes    Intravitreal Injection, Pharmacologic Agent - OS - Left Eye    aflibercept (EYLEA) SOLN 2 mg    2. Essential hypertension  I10     3. Hypertensive retinopathy of both eyes  H35.033     4. Mild nonproliferative diabetic retinopathy of both eyes without macular edema associated with type 2 diabetes mellitus ([place])  V89.3810     5. 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-related cataract of both eyes  H25.813      BRVO w/ CME OS - by history, likely  onset around Dec 2022, but delayed presentation to May 2023 - FA 5.17.23 shows: Focal cluster of leakage superior mac and perifovea, ?macroaneurysm - s/p IVA OS #1 (05.17.23), #2 (06.14.23), #3 (08.02.23), #4 (09.06.23), #5 (10.04.23), #6 (11.06.23) - BCVA 20/70 from 20/60 OS - OCT shows persistent IRF/IRHM greatest superior fovea and macula -- slightly increased - discussed IVA resistance and possible switch in medication - will switch to [facility] today due to increased IRF - recommend IVE OS #1 today, 12.08.23 w/ f/u in 4 wks - pt wishes to proceed with injection   - RBA of procedure discussed, questions answered - IVA informed consent obtained and signed, 05.17.23 (OS) - IVE informed consent obtained and signed,  - see procedure note - F/U 4 weeks -- DFE/OCT/possible injection   2,3. Hypertensive retinopathy OU - discussed importance of tight BP control - monitor  4. 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   - The need for close monitoring of blood glucose, blood pressure, and serum lipids, avoiding cigarette or any type of tobacco, and the need for long term follow up was also discussed with patient. - exam shows rare MA OU  - FA (05.17.23) shows rare MA OU; no NV OU -- BRVO OS as above - OCT without diabetic macular edema, both eyes  - monitor  5. Mixed Cataract OU - The symptoms of cataract, surgical options, and treatments and risks were discussed with patient. - discussed diagnosis and progression - monitor   Ophthalmic Meds Ordered this visit:  Meds ordered this encounter  Medications   aflibercept (EYLEA) SOLN 2 mg     Return in about 4  weeks (around 11/29/2022) for f/u BRVO OS, DFE, OCT.  There are no Patient Instructions on file for this visit.   Explained the diagnoses, plan, and follow up with the patient  and they expressed understanding.  Patient expressed understanding of the importance of proper follow up care.   This document serves as a record of services personally performed by Gardiner Sleeper, MD, PhD. It was created on their behalf by San Jetty. Owens Shark, OA an ophthalmic technician. The creation of this record is the provider's dictation and/or activities during the visit.    Electronically signed by: San Jetty. Owens Shark, [place] 12.04.2023 7:58 PM   Gardiner Sleeper, M.D., Ph.D. Diseases & Surgery of the Retina and Vitreous [facility]  I have reviewed the above documentation for accuracy and completeness, and I agree with the above. Gardiner Sleeper, M.D., Ph.D. 11/03/22 8:09 PM   Abbreviations: M myopia (nearsighted); A astigmatism; H hyperopia (farsighted); P presbyopia; Mrx spectacle prescription;  CTL contact lenses; OD right eye; OS left eye; OU both eyes  XT exotropia; ET esotropia; PEK punctate epithelial keratitis; PEE punctate epithelial erosions; DES dry eye syndrome; MGD meibomian gland dysfunction; ATs artificial tears; PFAT's preservative free artificial tears; [place] nuclear sclerotic cataract; PSC posterior subcapsular cataract; ERM epi-retinal membrane; PVD posterior vitreous detachment; RD retinal detachment; DM diabetes mellitus; DR diabetic retinopathy; NPDR non-proliferative diabetic retinopathy; PDR proliferative diabetic retinopathy; CSME clinically significant macular edema; DME diabetic macular edema; dbh dot blot hemorrhages; CWS cotton wool spot; POAG primary open angle glaucoma; C/D cup-to-disc ratio; HVF humphrey visual field; GVF goldmann visual field; OCT optical coherence tomography; IOP intraocular pressure; BRVO Branch retinal vein occlusion; CRVO central retinal vein occlusion; CRAO central retinal artery occlusion; BRAO branch retinal artery occlusion; RT retinal tear; SB scleral buckle; PPV pars plana vitrectomy; VH Vitreous hemorrhage; PRP panretinal  laser photocoagulation; IVK intravitreal kenalog; VMT vitreomacular traction; MH Macular hole;  NVD neovascularization of the disc; NVE neovascularization elsewhere; AREDS age related eye disease study; ARMD age related macular degeneration; POAG primary open angle glaucoma; EBMD epithelial/anterior basement membrane dystrophy; ACIOL anterior chamber intraocular lens; IOL intraocular lens; PCIOL posterior chamber intraocular lens; Phaco/IOL phacoemulsification with intraocular lens placement; [place] photorefractive keratectomy; LASIK laser assisted in situ keratomileusis; HTN hypertension; DM diabetes mellitus; COPD chronic obstructive pulmonary disease

## 2022-10-29 ENCOUNTER — Encounter (INDEPENDENT_AMBULATORY_CARE_PROVIDER_SITE_OTHER): Payer: Medicare Other | Admitting: Ophthalmology

## 2022-10-29 DIAGNOSIS — E113293 Type 2 diabetes mellitus with mild nonproliferative diabetic retinopathy without macular edema, bilateral: Secondary | ICD-10-CM

## 2022-10-29 DIAGNOSIS — H34832 Tributary (branch) retinal vein occlusion, left eye, with macular edema: Secondary | ICD-10-CM

## 2022-10-29 DIAGNOSIS — H35033 Hypertensive retinopathy, bilateral: Secondary | ICD-10-CM

## 2022-10-29 DIAGNOSIS — I10 Essential (primary) hypertension: Secondary | ICD-10-CM

## 2022-10-29 DIAGNOSIS — H25813 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-related cataract, bilateral: Secondary | ICD-10-CM

## 2022-10-31 DIAGNOSIS — I5042 Chronic combined systolic (congestive) and diastolic (congestive) heart failure: Secondary | ICD-10-CM | POA: Diagnosis not present

## 2022-10-31 DIAGNOSIS — E1121 Type 2 diabetes mellitus with diabetic nephropathy: Secondary | ICD-10-CM | POA: Diagnosis not present

## 2022-11-01 ENCOUNTER — Encounter (INDEPENDENT_AMBULATORY_CARE_PROVIDER_SITE_OTHER): Payer: Self-pay | Admitting: Ophthalmology

## 2022-11-01 ENCOUNTER — Ambulatory Visit (INDEPENDENT_AMBULATORY_CARE_PROVIDER_SITE_OTHER): Payer: Medicare Other | Admitting: Ophthalmology

## 2022-11-01 DIAGNOSIS — H35033 Hypertensive retinopathy, bilateral: Secondary | ICD-10-CM | POA: Diagnosis not present

## 2022-11-01 DIAGNOSIS — H25813 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-related cataract, bilateral: Secondary | ICD-10-CM | POA: Diagnosis not present

## 2022-11-01 DIAGNOSIS — I10 Essential (primary) hypertension: Secondary | ICD-10-CM | POA: Diagnosis not present

## 2022-11-01 DIAGNOSIS — E113293 Type 2 diabetes mellitus with mild nonproliferative diabetic retinopathy without macular edema, bilateral: Secondary | ICD-10-CM

## 2022-11-01 DIAGNOSIS — H34832 Tributary (branch) retinal vein occlusion, left eye, with macular edema: Secondary | ICD-10-CM

## 2022-11-01 MED ORDER — AFLIBERCEPT 2MG/0.05ML IZ SOLN FOR KALEIDOSCOPE
2.0000 mg | INTRAVITREAL | Status: AC | PRN
  Administered 2022-11-01: 2 mg via INTRAVITREAL

## 2022-11-28 NOTE — Progress Notes (Shared)
[facility] Note  11/29/2022     CHIEF COMPLAINT Patient presents for No chief complaint on file.   HISTORY OF PRESENT ILLNESS: Michelle Morrison is a 62 y.o. female who presents to the clinic today for:     Pt feels    Referring physician: [name], MD [place],  [place] [address]  HISTORICAL INFORMATION:   Selected notes from the MEDICAL RECORD NUMBER Referred by Dr. [name]: 05.08.23 Ocular Hx- NPDR OS  Patient saw Dr. [name] Feb 2022.  Vision at that time was OD 20/20, OS 20/25    CURRENT MEDICATIONS: No current outpatient medications on file. (Ophthalmic Drugs)   No current facility-administered medications for this visit. (Ophthalmic Drugs)   Current Outpatient Medications (Other)  Medication Sig   albuterol (VENTOLIN HFA) 108 (90 Base) MCG/ACT inhaler Inhale 1-2 puffs into the lungs every 4 (four) hours as needed.   amLODipine (NORVASC) 5 MG tablet Take 2 tablets (10 mg total) by mouth daily.   atorvastatin (LIPITOR) 80 MG tablet Take 1 tablet (80 mg total) by mouth daily.   dapagliflozin propanediol (FARXIGA) 10 MG TABS tablet TAKE 1 TABLET (10 MG TOTAL) BY MOUTH DAILY.   dronedarone (MULTAQ) 400 MG tablet Take 1 tablet (400 mg total) by mouth 2 (two) times daily with a meal.   isosorbide-hydrALAZINE (BIDIL) 20-37.5 MG tablet TAKE 2 TABLETS BY MOUTH THREE TIMES DAILY   sacubitril-valsartan (ENTRESTO) 97-103 MG Take 1 tablet by mouth 2 (two) times daily.   No current facility-administered medications for this visit. (Other)   REVIEW OF SYSTEMS:   ALLERGIES No Known Allergies  PAST MEDICAL HISTORY Past Medical History:  Diagnosis Date   CHF (congestive heart failure) ([place])    a. EF 45% by echo in 03/2020 with normal cors by cath   Diabetes mellitus without complication ([place])    Diarrhea    Heart disease    Herpes simplex infection    Hyperlipidemia    Hypertension    Past Surgical  History:  Procedure Laterality Date   CESAREAN SECTION     CHOLECYSTECTOMY     COLONOSCOPY N/A 08/26/2014   Procedure: COLONOSCOPY;  Surgeon: [name], MD;  [facility];  Service: Endoscopy;  Laterality: N/A;  9:30 AM - moved to 8:30 - Ginger to notify pt   LEFT HEART CATH AND CORONARY ANGIOGRAPHY N/A 04/14/2020   Procedure: LEFT HEART CATH AND CORONARY ANGIOGRAPHY;  Surgeon: [name], [name], MD;  Location: [facility];  Service: Cardiovascular;  Laterality: N/A;   RIGHT/LEFT HEART CATH AND CORONARY ANGIOGRAPHY N/A 02/19/2021   Procedure: RIGHT/LEFT HEART CATH AND CORONARY ANGIOGRAPHY;  Surgeon: [name], MD;  Location: [facility];  Service: Cardiovascular;  Laterality: N/A;   FAMILY HISTORY Family History  Problem Relation Age of Onset   Hypertension Mother    Diabetes Mother    Colon cancer Neg Hx    SOCIAL HISTORY Social History   Tobacco Use   Smoking status: Never   Smokeless tobacco: Never  Vaping Use   Vaping Use: Never used  Substance Use Topics   Alcohol use: No    Alcohol/week: 0.0 standard drinks of alcohol   Drug use: No       OPHTHALMIC EXAM:  Not recorded    IMAGING AND PROCEDURES  Imaging and Procedures for 11/29/2022          ASSESSMENT/PLAN:    ICD-10-CM  1. Branch retinal vein occlusion of left eye with macular edema  H34.8320     2. Essential hypertension  I10     3. Hypertensive retinopathy of both eyes  H35.033     4. Mild nonproliferative diabetic retinopathy of both eyes without macular edema associated with type 2 diabetes mellitus ([place])  I50.3888     5. 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-related cataract of both eyes  H25.813      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, ?macroaneurysm - s/p IVA OS #1 (05.17.23), #2 (06.14.23), #3 (08.02.23), #4 (09.06.23), #5 (10.04.23), #6  (11.06.23)  -s/p I'VE OS #1(11/01/2022) - BCVA 20/70 from 20/60 OS - OCT shows persistent IRF/IRHM greatest superior fovea and macula -- slightly increased - discussed IVA resistance and possible switch in medication - will switch to [facility] today due to increased IRF - recommend IVE OS #2 today, 11/28/2022 w/ f/u in 4 wks - pt wishes to proceed with injection   - RBA of procedure discussed, questions answered - IVA informed consent obtained and signed, 05.17.23 (OS) - IVE informed consent obtained and signed,  - see procedure note - F/U 4 weeks -- DFE/OCT/possible injection   2,3. Hypertensive retinopathy OU - discussed importance of tight BP control - monitor   4. 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   - The need for close monitoring of blood glucose, blood pressure, and serum lipids, avoiding cigarette or any type of tobacco, and the need for long term follow up was also discussed with patient. - exam shows rare MA OU  - FA (05.17.23) shows rare MA OU; no NV OU -- BRVO OS as above - OCT without diabetic macular edema, both eyes  - monitor   5. Mixed Cataract OU - The symptoms of cataract, surgical options, and treatments and risks were discussed with patient. - discussed diagnosis and progression - monitor   Ophthalmic Meds Ordered this visit:  No orders of the defined types were placed in this encounter.    No follow-ups on file.  There are no Patient Instructions on file for this visit.   This document serves as a record of services personally performed by Gardiner Sleeper, MD, PhD. It was created on their behalf by Parthenia Ames, COT, an ophthalmic technician. The creation of this record is the provider's dictation and/or activities during the visit.    Electronically signed by: Parthenia Ames,  COT 11/28/2022 2:18 PM    Gardiner Sleeper, M.D., Ph.D. Diseases & Surgery of the Retina and Vitreous [facility]: M myopia (nearsighted); A astigmatism; H hyperopia (farsighted); P presbyopia; Mrx spectacle prescription;  CTL contact lenses; OD right eye; OS left eye; OU both eyes  XT exotropia; ET esotropia; PEK punctate epithelial keratitis; PEE punctate epithelial erosions; DES dry eye syndrome; MGD meibomian gland dysfunction; ATs artificial tears; PFAT's preservative free artificial tears; [place] nuclear sclerotic cataract; PSC posterior subcapsular cataract; ERM epi-retinal membrane; PVD posterior vitreous detachment; RD retinal detachment; DM diabetes mellitus; DR diabetic retinopathy; NPDR non-proliferative diabetic retinopathy; PDR proliferative diabetic retinopathy; CSME clinically significant macular edema; DME diabetic macular edema; dbh dot blot hemorrhages; CWS cotton wool spot; POAG primary open angle glaucoma;  C/D cup-to-disc ratio; HVF humphrey visual field; GVF goldmann visual field; OCT optical coherence tomography; IOP intraocular pressure; BRVO Branch retinal vein occlusion; CRVO central retinal vein occlusion; CRAO central retinal artery occlusion; BRAO branch retinal artery occlusion; RT retinal tear; SB scleral buckle; PPV pars plana vitrectomy; VH Vitreous hemorrhage; PRP panretinal laser photocoagulation; IVK intravitreal kenalog; VMT vitreomacular traction; MH Macular hole;  NVD neovascularization of the disc; NVE neovascularization elsewhere; AREDS age related eye disease study; ARMD age related macular degeneration; POAG primary open angle glaucoma; EBMD epithelial/anterior basement membrane dystrophy; ACIOL anterior chamber intraocular lens; IOL intraocular lens; PCIOL posterior chamber intraocular lens; Phaco/IOL phacoemulsification with intraocular lens placement; [place] photorefractive keratectomy; LASIK laser assisted in situ  keratomileusis; HTN hypertension; DM diabetes mellitus; COPD chronic obstructive pulmonary disease

## 2022-11-29 ENCOUNTER — Encounter (INDEPENDENT_AMBULATORY_CARE_PROVIDER_SITE_OTHER): Payer: Medicare Other | Admitting: Ophthalmology

## 2022-11-29 DIAGNOSIS — I10 Essential (primary) hypertension: Secondary | ICD-10-CM

## 2022-11-29 DIAGNOSIS — E113293 Type 2 diabetes mellitus with mild nonproliferative diabetic retinopathy without macular edema, bilateral: Secondary | ICD-10-CM

## 2022-11-29 DIAGNOSIS — H34832 Tributary (branch) retinal vein occlusion, left eye, with macular edema: Secondary | ICD-10-CM

## 2022-11-29 DIAGNOSIS — H25813 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-related cataract, bilateral: Secondary | ICD-10-CM

## 2022-11-29 DIAGNOSIS — H35033 Hypertensive retinopathy, bilateral: Secondary | ICD-10-CM

## 2022-12-01 DIAGNOSIS — I10 Essential (primary) hypertension: Secondary | ICD-10-CM | POA: Diagnosis not present

## 2022-12-01 DIAGNOSIS — I5042 Chronic combined systolic (congestive) and diastolic (congestive) heart failure: Secondary | ICD-10-CM | POA: Diagnosis not present

## 2022-12-05 NOTE — Progress Notes (Signed)
[facility] Note  12/11/2022    CHIEF COMPLAINT Patient presents for Retina Follow Up   HISTORY OF PRESENT ILLNESS: Michelle Morrison is a 62 y.o. female who presents to the clinic today for:   HPI     Retina Follow Up   Patient presents with  CRVO/BRVO.  In left eye.  This started 4 weeks ago.  I, the attending physician,  performed the HPI with the patient and updated documentation appropriately.        Comments   Patient here for 4 weeks retina follow up for BRVO OS. Patient states vision doing pretty good so far. No eye pain.      Last edited by [name], MD on 12/11/2022  4:13 PM.     Referring physician: [name], MD [place],  [place] [address]  HISTORICAL INFORMATION:   Selected notes from the MEDICAL RECORD NUMBER Referred by Dr. [name]: 05.08.23 Ocular Hx- NPDR OS  Patient saw Dr. [name] Feb 2022.  Vision at that time was OD 20/20, OS 20/25    CURRENT MEDICATIONS: No current outpatient medications on file. (Ophthalmic Drugs)   No current facility-administered medications for this visit. (Ophthalmic Drugs)   Current Outpatient Medications (Other)  Medication Sig   albuterol (VENTOLIN HFA) 108 (90 Base) MCG/ACT inhaler Inhale 1-2 puffs into the lungs every 4 (four) hours as needed.   amLODipine (NORVASC) 5 MG tablet Take 2 tablets (10 mg total) by mouth daily.   atorvastatin (LIPITOR) 80 MG tablet Take 1 tablet (80 mg total) by mouth daily.   dapagliflozin propanediol (FARXIGA) 10 MG TABS tablet TAKE 1 TABLET (10 MG TOTAL) BY MOUTH DAILY.   dronedarone (MULTAQ) 400 MG tablet Take 1 tablet (400 mg total) by mouth 2 (two) times daily with a meal.   isosorbide-hydrALAZINE (BIDIL) 20-37.5 MG tablet TAKE 2 TABLETS BY MOUTH THREE TIMES DAILY   sacubitril-valsartan (ENTRESTO) 97-103 MG Take 1 tablet by mouth 2 (two) times daily.   No current facility-administered medications for this visit.  (Other)   REVIEW OF SYSTEMS: ROS   Positive for: Endocrine, Cardiovascular, Eyes, Respiratory Negative for: Constitutional, Gastrointestinal, Neurological, Skin, Genitourinary, Musculoskeletal, HENT, Psychiatric, Allergic/Imm, Heme/Lymph Last edited by Theodore Demark, COA on 12/11/2022  2:20 PM.     ALLERGIES No Known Allergies  PAST MEDICAL HISTORY Past Medical History:  Diagnosis Date   CHF (congestive heart failure) ([place])    a. EF 45% by echo in 03/2020 with normal cors by cath   Diabetes mellitus without complication ([place])    Diarrhea    Heart disease    Herpes simplex infection    Hyperlipidemia    Hypertension    Past Surgical History:  Procedure Laterality Date   CESAREAN SECTION     CHOLECYSTECTOMY     COLONOSCOPY N/A 08/26/2014   Procedure: COLONOSCOPY;  Surgeon: [name], MD;  [facility];  Service: Endoscopy;  Laterality: N/A;  9:30 AM - moved to 8:30 - Ginger to notify pt   LEFT HEART CATH AND CORONARY ANGIOGRAPHY N/A 04/14/2020   Procedure: LEFT HEART CATH AND CORONARY ANGIOGRAPHY;  Surgeon: [name], [name], MD;  Location: [facility];  Service: Cardiovascular;  Laterality: N/A;   RIGHT/LEFT HEART CATH AND CORONARY ANGIOGRAPHY N/A 02/19/2021   Procedure: RIGHT/LEFT HEART CATH AND CORONARY ANGIOGRAPHY;  Surgeon: [name], MD;  Location: [facility];  Service: Cardiovascular;  Laterality: N/A;  FAMILY HISTORY Family History  Problem Relation Age of Onset   Hypertension Mother    Diabetes Mother    Colon cancer Neg Hx    SOCIAL HISTORY Social History   Tobacco Use   Smoking status: Never   Smokeless tobacco: Never  Vaping Use   Vaping Use: Never used  Substance Use Topics   Alcohol use: No    Alcohol/week: 0.0 standard drinks of alcohol   Drug use: No       OPHTHALMIC EXAM:  Base Eye Exam     Visual Acuity (Snellen - Linear)       Right Left   Dist [place] 20/20 -1 20/60   Dist ph [place]  20/60 +1          Tonometry (Tonopen, 2:18 PM)       Right Left   Pressure 12 10         Pupils       Dark Light Shape React APD   Right 2 1 Round Minimal None   Left 2 1 Round Minimal None         Visual Fields (Counting fingers)       Left Right    Full Full         Extraocular Movement       Right Left    Full, Ortho Full, Ortho         Neuro/Psych     Oriented x3: Yes   Mood/Affect: Normal         Dilation     Both eyes: 1.0% Mydriacyl, 2.5% Phenylephrine @ 2:18 PM           Slit Lamp and Fundus Exam     Slit Lamp Exam       Right Left   Lids/Lashes Dermatochalasis - upper lid, Meibomian gland dysfunction Dermatochalasis - upper lid, Meibomian gland dysfunction   Conjunctiva/Sclera melanosis melanosis   Cornea trace PEE trace PEE   Anterior Chamber deep, narrow temporal angle deep, narrow temporal angle   Iris Round and dilated, No NVI Round and dilated, No NVI   Lens 2+ Nuclear sclerosis, 2+ Cortical cataract 2+ Nuclear sclerosis, 2+ Cortical cataract   Anterior Vitreous Vitreous syneresis Vitreous syneresis         Fundus Exam       Right Left   Disc Pink and sharp, PPP Pink and sharp, PPP   C/D Ratio 0.6 0.6   Macula Flat, Good foveal reflex, +IRH inf nasal mac - fading Blunted foveal reflex, central edema -- improved, perifoveal exudates, cluster of IRH superior macula -- all improving   Vessels Attenuated, Tortuous Attenuated, Tortuous, Copper wiring, old BRVO with macroaneurysm superior macula   Periphery Attached, rare MA Attached, rare MA           IMAGING AND PROCEDURES  Imaging and Procedures for 12/11/2022  OCT, Retina - OU - Both Eyes       Right Eye Quality was good. Central Foveal Thickness: 228. Progression has been stable. Findings include normal foveal contour, no IRF, no SRF, vitreomacular adhesion .   Left Eye Quality was good. Central Foveal Thickness: 318. Progression has improved. Findings include no SRF, abnormal foveal  contour, subretinal hyper-reflective material, intraretinal hyper-reflective material, intraretinal fluid, vitreomacular adhesion (Mild interval improvement IRF/IRHM greatest superior fovea and macula ).   Notes *Images captured and stored on drive  Diagnosis / Impression:  OD: NFP; no IRF/SRF  OS: BRVO w/ CME - Mild interval improvement  IRF/IRHM greatest superior fovea and macula   Clinical management:  See below  Abbreviations: NFP - Normal foveal profile. CME - cystoid macular edema. PED - pigment epithelial detachment. IRF - intraretinal fluid. SRF - subretinal fluid. EZ - ellipsoid zone. ERM - epiretinal membrane. ORA - outer retinal atrophy. ORT - outer retinal tubulation. SRHM - subretinal hyper-reflective material. IRHM - intraretinal hyper-reflective material      Intravitreal Injection, Pharmacologic Agent - OS - Left Eye       Time Out 12/11/2022. 2:53 PM. Confirmed correct patient, procedure, site, and patient consented.   Anesthesia Topical anesthesia was used. Anesthetic medications included Lidocaine 2%, Proparacaine 0.5%.   Procedure Preparation included 5% betadine to ocular surface, eyelid speculum. A (32g) needle was used.   Injection: 2 mg aflibercept 2 MG/0.05ML   Route: Intravitreal, Site: Left Eye   NDC: [id number], Lot: [id number], Expiration date: 02/23/2024, Waste: 0 mL   Post-op Post injection exam found visual acuity of at least counting fingers. The patient tolerated the procedure well. There were no complications. The patient received written and verbal post procedure care education. Post injection medications were not given.            ASSESSMENT/PLAN:    ICD-10-CM   1. Branch retinal vein occlusion of left eye with macular edema  H34.8320 OCT, Retina - OU - Both Eyes    Intravitreal Injection, Pharmacologic Agent - OS - Left Eye    aflibercept (EYLEA) SOLN 2 mg    2. Essential hypertension  I10     3. Hypertensive retinopathy of both  eyes  H35.033     4. Mild nonproliferative diabetic retinopathy of both eyes without macular edema associated with type 2 diabetes mellitus ([place])  X21.1941     5. 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-related cataract of both eyes  H25.813      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, ?macroaneurysm - s/p IVA OS #1 (05.17.23), #2 (06.14.23), #3 (08.02.23), #4 (09.06.23), #5 (10.04.23), #6 (11.06.23) -- IVA resistance - s/p IVE OS #1 (12.08.23) - BCVA 20/60 from 20/70 OS - OCT shows Mild interval improvement IRF/IRHM greatest superior fovea and macula at 6 weeks - recommend IVE OS #2 today, 01.17.24 w/ f/u in 6 wks - pt wishes to proceed with injection   - RBA of procedure discussed, questions answered - IVA informed consent obtained and signed, 05.17.23 (OS) - IVE informed consent obtained and signed,  - see procedure note - F/U 6 weeks -- DFE/OCT/possible injection   2,3. Hypertensive retinopathy OU - discussed importance of tight BP control - monitor  4. 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   - The need for close monitoring of blood glucose, blood pressure, and serum lipids, avoiding cigarette or any type of tobacco, and the need for long term follow up was also discussed with patient. - exam shows rare MA OU  - FA (05.17.23) shows rare MA OU; no NV OU -- BRVO OS as above - OCT without diabetic macular edema, both eyes  - monitor  5. Mixed Cataract OU - The symptoms of cataract, surgical options, and treatments and risks were discussed with patient. - discussed diagnosis and progression - monitor   Ophthalmic Meds Ordered this visit:  Meds ordered this encounter  Medications   aflibercept (EYLEA) SOLN  2 mg     Return in about 6 weeks (around 01/22/2023) for f/u BRVO OS, DFE, OCT.  There are no Patient Instructions on file for this visit.   Explained the diagnoses, plan, and follow up with the patient and they expressed understanding.  Patient expressed understanding of the importance of proper follow up care.   This document serves as a record of services personally performed by Gardiner Sleeper, MD, PhD. It was created on their behalf by Orvan Falconer, an ophthalmic technician. The creation of this record is the provider's dictation and/or activities during the visit.    Electronically signed by: Orvan Falconer, OA, 12/11/22  4:17 PM  This document serves as a record of services personally performed by Gardiner Sleeper, MD, PhD. It was created on their behalf by San Jetty. Owens Shark, OA an ophthalmic technician. The creation of this record is the provider's dictation and/or activities during the visit.    Electronically signed by: San Jetty. Owens Shark, [place] 01.17.2024 4:17 PM  Gardiner Sleeper, M.D., Ph.D. Diseases & Surgery of the Retina and Vitreous [facility]  I have reviewed the above documentation for accuracy and completeness, and I agree with the above. Gardiner Sleeper, M.D., Ph.D. 12/11/22 4:17 PM  Abbreviations: M myopia (nearsighted); A astigmatism; H hyperopia (farsighted); P presbyopia; Mrx spectacle prescription;  CTL contact lenses; OD right eye; OS left eye; OU both eyes  XT exotropia; ET esotropia; PEK punctate epithelial keratitis; PEE punctate epithelial erosions; DES dry eye syndrome; MGD meibomian gland dysfunction; ATs artificial tears; PFAT's preservative free artificial tears; [employer] nuclear sclerotic cataract; PSC posterior subcapsular cataract; ERM epi-retinal membrane; PVD posterior vitreous detachment; RD retinal detachment; DM diabetes mellitus; DR diabetic retinopathy; NPDR non-proliferative diabetic retinopathy; PDR proliferative diabetic  retinopathy; CSME clinically significant macular edema; DME diabetic macular edema; dbh dot blot hemorrhages; CWS cotton wool spot; POAG primary open angle glaucoma; C/D cup-to-disc ratio; HVF humphrey visual field; GVF goldmann visual field; OCT optical coherence tomography; IOP intraocular pressure; BRVO Branch retinal vein occlusion; CRVO central retinal vein occlusion; CRAO central retinal artery occlusion; BRAO branch retinal artery occlusion; RT retinal tear; SB scleral buckle; PPV pars plana vitrectomy; VH Vitreous hemorrhage; PRP panretinal laser photocoagulation; IVK intravitreal kenalog; VMT vitreomacular traction; MH Macular hole;  NVD neovascularization of the disc; NVE neovascularization elsewhere; AREDS age related eye disease study; ARMD age related macular degeneration; POAG primary open angle glaucoma; EBMD epithelial/anterior basement membrane dystrophy; ACIOL anterior chamber intraocular lens; IOL intraocular lens; PCIOL posterior chamber intraocular lens; Phaco/IOL phacoemulsification with intraocular lens placement; [place] photorefractive keratectomy; LASIK laser assisted in situ keratomileusis; HTN hypertension; DM diabetes mellitus; COPD chronic obstructive pulmonary disease

## 2022-12-11 ENCOUNTER — Ambulatory Visit (INDEPENDENT_AMBULATORY_CARE_PROVIDER_SITE_OTHER): Payer: 59 | Admitting: Ophthalmology

## 2022-12-11 ENCOUNTER — Encounter (INDEPENDENT_AMBULATORY_CARE_PROVIDER_SITE_OTHER): Payer: Self-pay | Admitting: Ophthalmology

## 2022-12-11 DIAGNOSIS — H35033 Hypertensive retinopathy, bilateral: Secondary | ICD-10-CM

## 2022-12-11 DIAGNOSIS — E113293 Type 2 diabetes mellitus with mild nonproliferative diabetic retinopathy without macular edema, bilateral: Secondary | ICD-10-CM | POA: Diagnosis not present

## 2022-12-11 DIAGNOSIS — H25813 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-related cataract, bilateral: Secondary | ICD-10-CM

## 2022-12-11 DIAGNOSIS — I10 Essential (primary) hypertension: Secondary | ICD-10-CM | POA: Diagnosis not present

## 2022-12-11 DIAGNOSIS — H34832 Tributary (branch) retinal vein occlusion, left eye, with macular edema: Secondary | ICD-10-CM | POA: Diagnosis not present

## 2022-12-11 MED ORDER — AFLIBERCEPT 2MG/0.05ML IZ SOLN FOR KALEIDOSCOPE
2.0000 mg | INTRAVITREAL | Status: AC | PRN
  Administered 2022-12-11: 2 mg via INTRAVITREAL

## 2023-01-01 DIAGNOSIS — I10 Essential (primary) hypertension: Secondary | ICD-10-CM | POA: Diagnosis not present

## 2023-01-01 DIAGNOSIS — I5042 Chronic combined systolic (congestive) and diastolic (congestive) heart failure: Secondary | ICD-10-CM | POA: Diagnosis not present

## 2023-01-03 ENCOUNTER — Ambulatory Visit: Payer: Medicare Other | Admitting: Cardiology

## 2023-01-06 ENCOUNTER — Other Ambulatory Visit (HOSPITAL_COMMUNITY): Payer: Self-pay | Admitting: Gerontology

## 2023-01-06 ENCOUNTER — Other Ambulatory Visit (HOSPITAL_COMMUNITY)
Admission: RE | Admit: 2023-01-06 | Discharge: 2023-01-06 | Disposition: A | Discharge status: Home / Self Care | Payer: 59 | Source: Ambulatory Visit | Attending: Gerontology | Admitting: Gerontology

## 2023-01-06 ENCOUNTER — Ambulatory Visit (HOSPITAL_COMMUNITY)
Admission: RE | Admit: 2023-01-06 | Discharge: 2023-01-06 | Disposition: A | Discharge status: Home / Self Care | Payer: 59 | Source: Ambulatory Visit | Attending: Gerontology | Admitting: Gerontology

## 2023-01-06 DIAGNOSIS — E1121 Type 2 diabetes mellitus with diabetic nephropathy: Secondary | ICD-10-CM | POA: Diagnosis not present

## 2023-01-06 DIAGNOSIS — M25552 Pain in left hip: Secondary | ICD-10-CM

## 2023-01-06 DIAGNOSIS — I10 Essential (primary) hypertension: Secondary | ICD-10-CM | POA: Diagnosis not present

## 2023-01-06 DIAGNOSIS — N390 Urinary tract infection, site not specified: Secondary | ICD-10-CM | POA: Insufficient documentation

## 2023-01-06 DIAGNOSIS — M160 Bilateral primary osteoarthritis of hip: Secondary | ICD-10-CM | POA: Diagnosis not present

## 2023-01-06 LAB — BASIC METABOLIC PANEL
Anion gap: 10 (ref 5–15)
BUN: 27 mg/dL — ABNORMAL HIGH (ref 6–20)
CO2: 24 mmol/L (ref 22–32)
Calcium: 8.7 mg/dL — ABNORMAL LOW (ref 8.9–10.3)
Chloride: 102 mmol/L (ref 98–111)
Creatinine, Ser: 1.01 mg/dL — ABNORMAL HIGH (ref 0.44–1.00)
GFR, Estimated: >60 mL/min (ref >60)
Glucose, Bld: 122 mg/dL — ABNORMAL HIGH (ref 70–99)
Potassium: 3.7 mmol/L (ref 3.5–5.1)
Sodium: 136 mmol/L (ref 135–145)

## 2023-01-06 LAB — URINALYSIS, W/ REFLEX TO CULTURE (INFECTION SUSPECTED)
Bilirubin Urine: NEGATIVE
Glucose, UA: >=500 mg/dL — AB
Hgb urine dipstick: NEGATIVE
Ketones, ur: NEGATIVE mg/dL
Nitrite: NEGATIVE
Protein, ur: 30 mg/dL — AB
Specific Gravity, Urine: 1.026 (ref 1.005–1.030)
pH: 5 (ref 5.0–8.0)

## 2023-01-08 LAB — URINE CULTURE: Culture: >=100000 — AB

## 2023-01-09 ENCOUNTER — Ambulatory Visit: Payer: 59 | Attending: Cardiology | Admitting: Cardiology

## 2023-01-09 ENCOUNTER — Encounter: Payer: Self-pay | Admitting: Cardiology

## 2023-01-09 VITALS — BP 150/75 | HR 84 | Ht 67.0 in | Wt 173.8 lb

## 2023-01-09 DIAGNOSIS — I5022 Chronic systolic (congestive) heart failure: Secondary | ICD-10-CM

## 2023-01-09 DIAGNOSIS — I4719 Other supraventricular tachycardia: Secondary | ICD-10-CM | POA: Diagnosis not present

## 2023-01-09 DIAGNOSIS — I10 Essential (primary) hypertension: Secondary | ICD-10-CM

## 2023-01-09 MED ORDER — HYDRALAZINE HCL 50 MG PO TABS: 75.0000 mg | ORAL_TABLET | Freq: Three times a day (TID) | ORAL | 3 refills | Status: DC

## 2023-01-09 MED ORDER — ISOSORBIDE MONONITRATE ER 30 MG PO TB24: 30.0000 mg | ORAL_TABLET | Freq: Every day | ORAL | 3 refills | Status: DC

## 2023-01-09 NOTE — Patient Instructions (Signed)
Medication Instructions:  STOP Bidil    START Imdur 30 mg daily    START Hydralazine 75 mg three times a day     Labwork: none  Testing/Procedures: Your physician has requested that you have an echocardiogram. Echocardiography is a painless test that uses sound waves to create images of your heart. It provides your doctor with information about the size and shape of your heart and how well your heart's chambers and valves are working. This procedure takes approximately one hour. There are no restrictions for this procedure. Please do NOT wear cologne, perfume, aftershave, or lotions (deodorant is allowed). Please arrive 15 minutes prior to your appointment time.   Follow-Up: 6 months  Any Other Special Instructions Will Be Listed Below (If Applicable).  If you need a refill on your cardiac medications before your next appointment, please call your pharmacy.

## 2023-01-09 NOTE — Progress Notes (Signed)
Clinical Summary Ms. Erck is a 62 y.o.female seen today for follow up of the following medical problem.s     1. Chronic systolic/diastolic HF - admit [id number] with pulmonary edema - EKG with LBBB, had WMA on echo referred for cath - 03/2020 cath: normal coronaries, LVEDP 10 - 03/2020 echo: LVEF 45%, anteroseptal wall hypokinesis, grade II diastolic dysfunction    Admitted 01/2021 with acute on chronic HF 01/2021 echo: LVEF 25-30%, mod to severe RV dysfunction, mod to severe TR - difficult to diurese with low bp's, worsening renal function. 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    07/2021 echo: LVEF 55-60%, no WMAs, grade I dd.   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 HTN - she is compliant with meds - home bp's 140s/60s     3. Hyperlipidemia - compliant with meds.  - she reports recent labs with pcp     4. PSVT/Atach - had some runs during prior hospital admission - 04/2020 monitor Min HR 38, Max HR 163, Avg HR 72. Min HR occurred in early AM hours presumably while sleeping Telemetry tracings show sinus rhythm and sinus bradycardia,several runs of atach with aberrancy up to 30 seconds.. Runs of atach followed by postconversion pauses up to 1.4 seconds. 5 beat run of NSVT No symptoms reported     01/2021 admissoins issues with atach, difficult to control due to intermittent bradycardia - started on amio gtt, transitioned to oral amion - has been on dronederone.   - no recent palpitations.    Past Medical History:  Diagnosis Date   CHF (congestive heart failure) ([place])    a. EF 45% by echo in 03/2020 with  normal cors by cath   Diabetes mellitus without complication (HCC)    Diarrhea    Heart disease    Herpes simplex infection    Hyperlipidemia    Hypertension      No Known Allergies   Current Outpatient Medications  Medication Sig Dispense Refill   albuterol (VENTOLIN HFA) 108 (90 Base) MCG/ACT inhaler Inhale 1-2 puffs into the lungs every 4 (four) hours as needed.     amLODipine (NORVASC) 5 MG tablet Take 2 tablets (10 mg total) by mouth daily. 90 tablet 3   atorvastatin (LIPITOR) 80 MG tablet Take 1 tablet (80 mg total) by mouth daily. 90 tablet 3   dapagliflozin propanediol (FARXIGA) 10 MG TABS tablet TAKE 1 TABLET (10 MG TOTAL) BY MOUTH DAILY. 30 tablet 6   dronedarone (MULTAQ) 400 MG tablet Take 1 tablet (400 mg total) by mouth 2 (two) times daily with a meal. 180 tablet 3   isosorbide-hydrALAZINE (BIDIL) 20-37.5 MG tablet TAKE 2 TABLETS BY MOUTH THREE TIMES DAILY 180 tablet 5   sacubitril-valsartan (ENTRESTO) 97-103 MG Take 1 tablet by mouth 2 (two) times daily. 60 tablet 11   No current facility-administered medications for this visit.     Past Surgical History:  Procedure Laterality Date   CESAREAN SECTION     CHOLECYSTECTOMY     COLONOSCOPY N/A 08/26/2014   Procedure: COLONOSCOPY;  Surgeon: Carlyon Prows  Rexene Edison, MD;  [facility];  Service: Endoscopy;  Laterality: N/A;  9:30 AM - moved to 8:30 - Ginger to notify pt   LEFT HEART CATH AND CORONARY ANGIOGRAPHY N/A 04/14/2020   Procedure: LEFT HEART CATH AND CORONARY ANGIOGRAPHY;  Surgeon: [name], [name], MD;  Location: [place] [facility];  Service: Cardiovascular;  Laterality: N/A;   RIGHT/LEFT HEART CATH AND CORONARY ANGIOGRAPHY N/A 02/19/2021   Procedure: RIGHT/LEFT HEART CATH AND CORONARY ANGIOGRAPHY;  Surgeon: [name], MD;  Location: [place] [facility];  Service: Cardiovascular;  Laterality: N/A;     No Known Allergies    Family History  Problem Relation Age of Onset   Hypertension Mother     Diabetes Mother    Colon cancer Neg Hx      Social History Ms. Dilger reports that she has never smoked. She has never used smokeless tobacco. Ms. Leacock reports no history of alcohol use.   Review of Systems CONSTITUTIONAL: No weight loss, fever, chills, weakness or fatigue.  HEENT: Eyes: No visual loss, blurred vision, double vision or yellow sclerae.No hearing loss, sneezing, congestion, runny nose or sore throat.  SKIN: No rash or itching.  CARDIOVASCULAR: per hpi RESPIRATORY: No shortness of breath, cough or sputum.  GASTROINTESTINAL: No anorexia, nausea, vomiting or diarrhea. No abdominal pain or blood.  GENITOURINARY: No burning on urination, no polyuria NEUROLOGICAL: No headache, dizziness, syncope, paralysis, ataxia, numbness or tingling in the extremities. No change in bowel or bladder control.  MUSCULOSKELETAL: No muscle, back pain, joint pain or stiffness.  LYMPHATICS: No enlarged nodes. No history of splenectomy.  PSYCHIATRIC: No history of depression or anxiety.  ENDOCRINOLOGIC: No reports of sweating, cold or heat intolerance. No polyuria or polydipsia.  Marland Kitchen   Physical Examination Today's Vitals   01/09/23 1129  BP: (!) 158/80  Pulse: 84  SpO2: 100%  Weight: 173 lb 12.8 oz (78.8 kg)  Height: 5' 7"$  (1.702 m)   Body mass index is 27.22 kg/m.  Gen: resting comfortably, no acute distress HEENT: no scleral icterus, pupils equal round and reactive, no palptable cervical adenopathy,  CV: RRR, no m/rg, no jvd Resp: Clear to auscultation bilaterally GI: abdomen is soft, non-tender, non-distended, normal bowel sounds, no hepatosplenomegaly MSK: extremities are warm, no edema.  Skin: warm, no rash Neuro:  no focal deficits Psych: appropriate affect   Diagnostic Studies  01/2021 echo IMPRESSIONS     1. Global hypokinesis with more prominent anteroseptal hypokinesis. .  Left ventricular ejection fraction, by estimation, is 25 to 30%. The left  ventricle has  severely decreased function. The left ventricle demonstrates  global hypokinesis. There is  moderate left ventricular hypertrophy. Left ventricular diastolic  parameters are indeterminate.   2. Right ventricular systolic function moderately to severely reduced.  The right ventricular size is severely enlarged. There is moderately  elevated pulmonary artery systolic pressure.   3. Left atrial size was severely dilated.   4. Right atrial size was severely dilated.   5. The mitral valve is normal in structure. No evidence of mitral valve  regurgitation. No evidence of mitral stenosis.   6. By color TR looks moderate. Hepatic systolic flow reversal would  suggest more severe TR. . The tricuspid valve is abnormal. Tricuspid valve  regurgitation is moderate to severe.   7. The aortic valve has an indeterminant number of cusps. Aortic valve  regurgitation is not visualized. No aortic stenosis is present.   8. The inferior vena cava is  normal in size with <50% respiratory  variability, suggesting right atrial pressure of 8 mmHg.    01/2021 RHC/LHC 1. Low filling pressures.  2. Preserved cardiac output.  3. No significant CAD.      01/2021 CMRI IMPRESSION: 1. Normal LV size with EF 20%, diffuse hypokinesis with septal-lateral dyssynchrony c/w LBBB.   2.  Normal RV size with EF 26%.   3. Diffuse delayed enhancement imaging, however there does appear to be LGE in the septum that could be in a coronary disease-type pattern.   Assessment and Plan   1. Chronic systolic heart failure - LVEF has recovered - last HF clinic appt plans were to repeat echo, we will reorder -continue current meds   2. Atach/PSVT - has done well on dronederone without symptoms, continue current meds   3.HTN - above goal. Stop bidil, start hydral 32m tid and imdur 34mdaily.     JoArnoldo LenisM.D.

## 2023-01-20 NOTE — Progress Notes (Shared)
[facility] Note  01/22/2023    CHIEF COMPLAINT Patient presents for No chief complaint on file.   HISTORY OF PRESENT ILLNESS: Michelle Morrison is a 62 y.o. female who presents to the clinic today for:     Referring physician: [name], MD [place],  [place] [id number]  HISTORICAL INFORMATION:   Selected notes from the MEDICAL RECORD NUMBER Referred by Dr. [name]: 05.08.23 Ocular Hx- NPDR OS  Patient saw Dr. [name] Feb 2022.  Vision at that time was OD 20/20, OS 20/25    CURRENT MEDICATIONS: No current outpatient medications on file. (Ophthalmic Drugs)   No current facility-administered medications for this visit. (Ophthalmic Drugs)   Current Outpatient Medications (Other)  Medication Sig   albuterol (VENTOLIN HFA) 108 (90 Base) MCG/ACT inhaler Inhale 1-2 puffs into the lungs every 4 (four) hours as needed.   amLODipine (NORVASC) 5 MG tablet Take 2 tablets (10 mg total) by mouth daily.   atorvastatin (LIPITOR) 80 MG tablet Take 1 tablet (80 mg total) by mouth daily.   dapagliflozin propanediol (FARXIGA) 10 MG TABS tablet TAKE 1 TABLET (10 MG TOTAL) BY MOUTH DAILY.   dronedarone (MULTAQ) 400 MG tablet Take 1 tablet (400 mg total) by mouth 2 (two) times daily with a meal.   hydrALAZINE (APRESOLINE) 50 MG tablet Take 1.5 tablets (75 mg total) by mouth 3 (three) times daily.   isosorbide mononitrate (IMDUR) 30 MG 24 hr tablet Take 1 tablet (30 mg total) by mouth daily.   sacubitril-valsartan (ENTRESTO) 97-103 MG Take 1 tablet by mouth 2 (two) times daily.   TRADJENTA 5 MG TABS tablet Take 5 mg by mouth daily.   No current facility-administered medications for this visit. (Other)   REVIEW OF SYSTEMS:   ALLERGIES No Known Allergies  PAST MEDICAL HISTORY Past Medical History:  Diagnosis Date   CHF (congestive heart failure) ([place])    a. EF 45% by echo in 03/2020 with normal cors by cath   Diabetes mellitus  without complication ([place])    Diarrhea    Heart disease    Herpes simplex infection    Hyperlipidemia    Hypertension    Past Surgical History:  Procedure Laterality Date   CESAREAN SECTION     CHOLECYSTECTOMY     COLONOSCOPY N/A 08/26/2014   Procedure: COLONOSCOPY;  Surgeon: [name], MD;  [facility];  Service: Endoscopy;  Laterality: N/A;  9:30 AM - moved to 8:30 - Ginger to notify pt   LEFT HEART CATH AND CORONARY ANGIOGRAPHY N/A 04/14/2020   Procedure: LEFT HEART CATH AND CORONARY ANGIOGRAPHY;  Surgeon: [name], [name], MD;  Location: [place] [facility];  Service: Cardiovascular;  Laterality: N/A;   RIGHT/LEFT HEART CATH AND CORONARY ANGIOGRAPHY N/A 02/19/2021   Procedure: RIGHT/LEFT HEART CATH AND CORONARY ANGIOGRAPHY;  Surgeon: [name], MD;  Location: [place] [facility];  Service: Cardiovascular;  Laterality: N/A;   FAMILY HISTORY Family History  Problem Relation Age of Onset   Hypertension Mother    Diabetes Mother    Colon cancer Neg Hx    SOCIAL HISTORY Social History   Tobacco Use   Smoking status: Never   Smokeless tobacco: Never  Vaping Use   Vaping Use: Never used  Substance Use Topics   Alcohol use: No    Alcohol/week: 0.0 standard drinks of alcohol   Drug use: No       OPHTHALMIC  EXAM:  Not recorded    IMAGING AND PROCEDURES  Imaging and Procedures for 01/22/2023          ASSESSMENT/PLAN:  No diagnosis found.  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, ?macroaneurysm - s/p IVA OS #1 (05.17.23), #2 (06.14.23), #3 (08.02.23), #4 (09.06.23), #5 (10.04.23), #6 (11.06.23) -- IVA resistance - s/p IVE OS #1 (12.08.23), #2 (01.17.24) - BCVA 20/60 from 20/70 OS - OCT shows Mild interval improvement IRF/IRHM greatest superior fovea and macula at 6 weeks - recommend IVE OS #3 today, 02.28.24 w/ f/u in  6 wks - pt wishes to proceed with injection   - RBA of procedure discussed, questions answered - IVA informed consent obtained and signed, 05.17.23 (OS) - IVE informed consent obtained and signed,  - see procedure note - F/U 6 weeks -- DFE/OCT/possible injection   2,3. Hypertensive retinopathy OU - discussed importance of tight BP control - monitor  4. 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   - The need for close monitoring of blood glucose, blood pressure, and serum lipids, avoiding cigarette or any type of tobacco, and the need for long term follow up was also discussed with patient. - exam shows rare MA OU  - FA (05.17.23) shows rare MA OU; no NV OU -- BRVO OS as above - OCT without diabetic macular edema, both eyes  - monitor  5. Mixed Cataract OU - The symptoms of cataract, surgical options, and treatments and risks were discussed with patient. - discussed diagnosis and progression - monitor   Ophthalmic Meds Ordered this visit:  No orders of the defined types were placed in this encounter.    No follow-ups on file.  There are no Patient Instructions on file for this visit.   Explained the diagnoses, plan, and follow up with the patient and they expressed understanding.  Patient expressed understanding of the importance of proper follow up care.   This document serves as a record of services personally performed by Gardiner Sleeper, MD, PhD. It was created on their behalf by Orvan Falconer, an ophthalmic technician. The creation of this record is the provider's dictation and/or activities during the visit.    Electronically signed by: Orvan Falconer, OA, 01/20/23  10:20 AM    Gardiner Sleeper, M.D., Ph.D. Diseases & Surgery of the Retina and Vitreous [facility]: M  myopia (nearsighted); A astigmatism; H hyperopia (farsighted); P presbyopia; Mrx spectacle prescription;  CTL contact lenses; OD right eye; OS left eye; OU both eyes  XT exotropia; ET esotropia; PEK punctate epithelial keratitis; PEE punctate epithelial erosions; DES dry eye syndrome; MGD meibomian gland dysfunction; ATs artificial tears; PFAT's preservative free artificial tears; [place] nuclear sclerotic cataract; PSC posterior subcapsular cataract; ERM epi-retinal membrane; PVD posterior vitreous detachment; RD retinal detachment; DM diabetes mellitus; DR diabetic retinopathy; NPDR non-proliferative diabetic retinopathy; PDR proliferative diabetic retinopathy; CSME clinically significant macular edema; DME diabetic macular edema; dbh dot blot hemorrhages; CWS cotton wool spot; POAG primary open angle glaucoma; C/D cup-to-disc ratio; HVF humphrey visual field; GVF goldmann visual field; OCT optical coherence tomography; IOP intraocular pressure; BRVO Branch retinal vein occlusion; CRVO central retinal vein occlusion; CRAO central retinal artery occlusion; BRAO branch retinal  artery occlusion; RT retinal tear; SB scleral buckle; PPV pars plana vitrectomy; VH Vitreous hemorrhage; PRP panretinal laser photocoagulation; IVK intravitreal kenalog; VMT vitreomacular traction; MH Macular hole;  NVD neovascularization of the disc; NVE neovascularization elsewhere; AREDS age related eye disease study; ARMD age related macular degeneration; POAG primary open angle glaucoma; EBMD epithelial/anterior basement membrane dystrophy; ACIOL anterior chamber intraocular lens; IOL intraocular lens; PCIOL posterior chamber intraocular lens; Phaco/IOL phacoemulsification with intraocular lens placement; [place] photorefractive keratectomy; LASIK laser assisted in situ keratomileusis; HTN hypertension; DM diabetes mellitus; COPD chronic obstructive pulmonary disease

## 2023-01-22 ENCOUNTER — Encounter (INDEPENDENT_AMBULATORY_CARE_PROVIDER_SITE_OTHER): Payer: 59 | Admitting: Ophthalmology

## 2023-01-22 DIAGNOSIS — E113293 Type 2 diabetes mellitus with mild nonproliferative diabetic retinopathy without macular edema, bilateral: Secondary | ICD-10-CM

## 2023-01-22 DIAGNOSIS — H25813 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-related cataract, bilateral: Secondary | ICD-10-CM

## 2023-01-22 DIAGNOSIS — H34832 Tributary (branch) retinal vein occlusion, left eye, with macular edema: Secondary | ICD-10-CM

## 2023-01-22 DIAGNOSIS — I10 Essential (primary) hypertension: Secondary | ICD-10-CM

## 2023-01-22 DIAGNOSIS — H35033 Hypertensive retinopathy, bilateral: Secondary | ICD-10-CM

## 2023-02-04 ENCOUNTER — Encounter (INDEPENDENT_AMBULATORY_CARE_PROVIDER_SITE_OTHER): Payer: 59 | Admitting: Ophthalmology

## 2023-02-05 NOTE — Progress Notes (Signed)
[facility] Note  02/06/2023    CHIEF COMPLAINT Patient presents for Retina Follow Up   HISTORY OF PRESENT ILLNESS: Michelle Morrison is a 62 y.o. female who presents to the clinic today for:   HPI     Retina Follow Up   Patient presents with  CRVO/BRVO.  In left eye.  This started 6 weeks ago.  I, the attending physician,  performed the HPI with the patient and updated documentation appropriately.        Comments   Patient here for 6 weeks retina follow up for CRVO OS. Patient states vision doing a little bit better. No eye pain.       Last edited by [name], MD on 02/06/2023  4:16 PM.    Patient sees a decrease in vision. She is delayed from 6 weeks to 8 weeks due transportation.   Referring physician: [name], MD [place],  [place] [address]  HISTORICAL INFORMATION:   Selected notes from the MEDICAL RECORD NUMBER Referred by Dr. [name]: 05.08.23 Ocular Hx- NPDR OS  Patient saw Dr. [name] Feb 2022.  Vision at that time was OD 20/20, OS 20/25    CURRENT MEDICATIONS: No current outpatient medications on file. (Ophthalmic Drugs)   No current facility-administered medications for this visit. (Ophthalmic Drugs)   Current Outpatient Medications (Other)  Medication Sig   albuterol (VENTOLIN HFA) 108 (90 Base) MCG/ACT inhaler Inhale 1-2 puffs into the lungs every 4 (four) hours as needed.   amLODipine (NORVASC) 5 MG tablet Take 2 tablets (10 mg total) by mouth daily.   atorvastatin (LIPITOR) 80 MG tablet Take 1 tablet (80 mg total) by mouth daily.   dapagliflozin propanediol (FARXIGA) 10 MG TABS tablet TAKE 1 TABLET (10 MG TOTAL) BY MOUTH DAILY.   dronedarone (MULTAQ) 400 MG tablet Take 1 tablet (400 mg total) by mouth 2 (two) times daily with a meal.   hydrALAZINE (APRESOLINE) 50 MG tablet Take 1.5 tablets (75 mg total) by mouth 3 (three) times daily.   isosorbide mononitrate (IMDUR) 30 MG 24 hr  tablet Take 1 tablet (30 mg total) by mouth daily.   sacubitril-valsartan (ENTRESTO) 97-103 MG Take 1 tablet by mouth 2 (two) times daily.   TRADJENTA 5 MG TABS tablet Take 5 mg by mouth daily.   No current facility-administered medications for this visit. (Other)   REVIEW OF SYSTEMS: ROS   Positive for: Endocrine, Cardiovascular, Eyes, Respiratory Negative for: Constitutional, Gastrointestinal, Neurological, Skin, Genitourinary, Musculoskeletal, HENT, Psychiatric, Allergic/Imm, Heme/Lymph Last edited by Theodore Demark, COA on 02/06/2023  9:02 AM.     ALLERGIES No Known Allergies  PAST MEDICAL HISTORY Past Medical History:  Diagnosis Date   CHF (congestive heart failure) ([place])    a. EF 45% by echo in 03/2020 with normal cors by cath   Diabetes mellitus without complication ([place])    Diarrhea    Heart disease    Herpes simplex infection    Hyperlipidemia    Hypertension    Past Surgical History:  Procedure Laterality Date   CESAREAN SECTION     CHOLECYSTECTOMY     COLONOSCOPY N/A 08/26/2014   Procedure: COLONOSCOPY;  Surgeon: [name], MD;  [facility];  Service: Endoscopy;  Laterality: N/A;  9:30 AM - moved to 8:30 - Ginger to notify pt   LEFT HEART CATH AND CORONARY ANGIOGRAPHY N/A 04/14/2020   Procedure: LEFT HEART CATH AND CORONARY ANGIOGRAPHY;  Surgeon: [name], [name], MD;  Location: [facility];  Service: Cardiovascular;  Laterality: N/A;   RIGHT/LEFT HEART CATH AND CORONARY ANGIOGRAPHY N/A 02/19/2021   Procedure: RIGHT/LEFT HEART CATH AND CORONARY ANGIOGRAPHY;  Surgeon: [name], MD;  Location: [facility];  Service: Cardiovascular;  Laterality: N/A;   FAMILY HISTORY Family History  Problem Relation Age of Onset   Hypertension Mother    Diabetes Mother    Colon cancer Neg Hx    SOCIAL HISTORY Social History   Tobacco Use   Smoking status: Never   Smokeless tobacco: Never  Vaping Use   Vaping Use: Never used  Substance  Use Topics   Alcohol use: No    Alcohol/week: 0.0 standard drinks of alcohol   Drug use: No       OPHTHALMIC EXAM:  Base Eye Exam     Visual Acuity (Snellen - Linear)       Right Left   Dist [place] 20/20 -2 20/60 -2   Dist ph [place]  NI         Tonometry (Tonopen, 9:00 AM)       Right Left   Pressure 16 12         Pupils       Dark Light Shape React APD   Right 2 1 Round Minimal None   Left 2 1 Round Minimal None         Visual Fields (Counting fingers)       Left Right    Full Full         Extraocular Movement       Right Left    Full, Ortho Full, Ortho         Neuro/Psych     Oriented x3: Yes   Mood/Affect: Normal         Dilation     Both eyes: 1.0% Mydriacyl, 2.5% Phenylephrine @ 9:00 AM           Slit Lamp and Fundus Exam     Slit Lamp Exam       Right Left   Lids/Lashes Dermatochalasis - upper lid, Meibomian gland dysfunction Dermatochalasis - upper lid, Meibomian gland dysfunction   Conjunctiva/Sclera melanosis melanosis   Cornea trace PEE trace PEE   Anterior Chamber deep, narrow temporal angle deep, narrow temporal angle   Iris Round and dilated, No NVI Round and dilated, No NVI   Lens 2+ Nuclear sclerosis, 2+ Cortical cataract 2+ Nuclear sclerosis, 2+ Cortical cataract   Anterior Vitreous Vitreous syneresis Vitreous syneresis         Fundus Exam       Right Left   Disc Pink and sharp, PPP Pink and sharp, PPP   C/D Ratio 0.6 0.6   Macula Flat, Good foveal reflex, trace cystic changes nasal to fovea Blunted foveal reflex, central edema -- slightly increased perifoveal exudates, cluster of IRH superior macula   Vessels Attenuated, Tortuous Attenuated, Tortuous, Copper wiring, 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 Central  Foveal Thickness: 225. Progression has worsened. Findings include normal foveal contour, no SRF, intraretinal hyper-reflective material, vitreomacular adhesion (Focal cystic changes and IRHM in temporal macula -- slightly increased).   Left Eye  Quality was good. Central Foveal Thickness: 354. Progression has worsened. Findings include no SRF, abnormal foveal contour, subretinal hyper-reflective material, intraretinal hyper-reflective material, intraretinal fluid, vitreomacular adhesion (interval increase in IRF/IRHM superior fovea and macula ).   Notes *Images captured and stored on drive  Diagnosis / Impression:  OD: NFP; Focal cystic changes and IRHM in temporal macula -- slightly increased OS: BRVO w/ CME - interval increase in IRF/IRHM  superior fovea and macula   Clinical management:  See below  Abbreviations: NFP - Normal foveal profile. CME - cystoid macular edema. PED - pigment epithelial detachment. IRF - intraretinal fluid. SRF - subretinal fluid. EZ - ellipsoid zone. ERM - epiretinal membrane. ORA - outer retinal atrophy. ORT - outer retinal tubulation. SRHM - subretinal hyper-reflective material. IRHM - intraretinal hyper-reflective material      Intravitreal Injection, Pharmacologic Agent - OS - Left Eye       Time Out 02/06/2023. 9:40 AM. Confirmed correct patient, procedure, site, and patient consented.   Anesthesia Topical anesthesia was used. Anesthetic medications included Lidocaine 2%, Proparacaine 0.5%.   Procedure Preparation included 5% betadine to ocular surface, eyelid speculum. A (32g) needle was used.   Injection: 2 mg aflibercept 2 MG/0.05ML   Route: Intravitreal, Site: Left Eye   NDC: [id number], Lot: [id number], Expiration date: 09/25/2023, Waste: 0 mL   Post-op Post injection exam found visual acuity of at least counting fingers. The patient tolerated the procedure well. There were no complications. The patient received written and verbal post  procedure care education. Post injection medications were not given.            ASSESSMENT/PLAN:    ICD-10-CM   1. Branch retinal vein occlusion of left eye with macular edema  H34.8320 OCT, Retina - OU - Both Eyes    Intravitreal Injection, Pharmacologic Agent - OS - Left Eye    aflibercept (EYLEA) SOLN 2 mg    2. Essential hypertension  I10     3. Hypertensive retinopathy of both eyes  H35.033     4. Mild nonproliferative diabetic retinopathy of both eyes without macular edema associated with type 2 diabetes mellitus ([place])  [id number]     5. 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-related cataract of both eyes  H25.813      BRVO w/ CME OS - delayed f/u: 8 wks instead of 6 (transportation) - by history, likely onset around Dec 2022, but delayed presentation to May 2023 - FA 5.17.23 shows: Focal cluster of leakage superior mac and perifovea, ?macroaneurysm - s/p IVA OS #1 (05.17.23), #2 (06.14.23), #3 (08.02.23), #4 (09.06.23), #5 (10.04.23), #6 (11.06.23) -- IVA resistance - s/p IVE OS #1 (12.08.23), #2 (01.17.24) - BCVA 20/60 -- stable - OCT shows OS: BRVO w/ CME - interval increase in IRF/IRHM  superior fovea and macula at 8 weeks - recommend IVE OS #3 today, 03.13.24 w/ f/u in 6 wks - pt wishes to proceed with injection   - RBA of procedure discussed, questions answered - IVA informed consent obtained and signed, 05.17.23 (OS) - IVE informed consent obtained and signed, 12.08.23 (OS) - see procedure note - F/U 6 weeks -- DFE/OCT/possible injection   2,3. Hypertensive retinopathy OU - discussed importance of tight BP control - monitor  4. 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   - The need for close monitoring of blood glucose, blood pressure, and serum lipids,  avoiding cigarette or any type of tobacco, and the need for long term  follow up was also discussed with patient. - exam shows rare MA OU  - FA (05.17.23) shows rare MA OU; no NV OU -- BRVO OS as above - OCT OD: NFP; Focal cystic changes and IRHM in temporal macula -- slightly increased - BCVA remains 20/20 OD - will continue to hold off on anti-VEGF therapy OD for now  - monitor  5. Mixed Cataract OU - The symptoms of cataract, surgical options, and treatments and risks were discussed with patient. - discussed diagnosis and progression - monitor   Ophthalmic Meds Ordered this visit:  Meds ordered this encounter  Medications   aflibercept (EYLEA) SOLN 2 mg     Return in about 6 weeks (around 03/20/2023) for f/u BRVO OS , DFE, OCT, Possible, IVE, OS.  There are no Patient Instructions on file for this visit.   Explained the diagnoses, plan, and follow up with the patient and they expressed understanding.  Patient expressed understanding of the importance of proper follow up care.   This document serves as a record of services personally performed by Gardiner Sleeper, MD, PhD. It was created on their behalf by San Jetty. Owens Shark, OA an ophthalmic technician. The creation of this record is the provider's dictation and/or activities during the visit.    Electronically signed by: San Jetty. Owens Shark, [place] 03.13.2024 4:18 PM  This document serves as a record of services personally performed by Gardiner Sleeper, MD, PhD. It was created on their behalf by Renaldo Reel, [place] an ophthalmic technician. The creation of this record is the provider's dictation and/or activities during the visit.    Electronically signed by:  Renaldo Reel, COT  03.14.24  4:18 PM  Gardiner Sleeper, M.D., Ph.D. Diseases & Surgery of the Retina and Vitreous [facility]  I have reviewed the above documentation for accuracy and completeness, and I agree with the above. Gardiner Sleeper, M.D., Ph.D. 02/06/23 4:31 PM   Abbreviations: M myopia (nearsighted); A  astigmatism; H hyperopia (farsighted); P presbyopia; Mrx spectacle prescription;  CTL contact lenses; OD right eye; OS left eye; OU both eyes  XT exotropia; ET esotropia; PEK punctate epithelial keratitis; PEE punctate epithelial erosions; DES dry eye syndrome; MGD meibomian gland dysfunction; ATs artificial tears; PFAT's preservative free artificial tears; [place] nuclear sclerotic cataract; PSC posterior subcapsular cataract; ERM epi-retinal membrane; PVD posterior vitreous detachment; RD retinal detachment; DM diabetes mellitus; DR diabetic retinopathy; NPDR non-proliferative diabetic retinopathy; PDR proliferative diabetic retinopathy; CSME clinically significant macular edema; DME diabetic macular edema; dbh dot blot hemorrhages; CWS cotton wool spot; POAG primary open angle glaucoma; C/D cup-to-disc ratio; HVF humphrey visual field; GVF goldmann visual field; OCT optical coherence tomography; IOP intraocular pressure; BRVO Branch retinal vein occlusion; CRVO central retinal vein occlusion; CRAO central retinal artery occlusion; BRAO branch retinal artery occlusion; RT retinal tear; SB scleral buckle; PPV pars plana vitrectomy; VH Vitreous hemorrhage; PRP panretinal laser photocoagulation; IVK intravitreal kenalog; VMT vitreomacular traction; MH Macular hole;  NVD neovascularization of the disc; NVE neovascularization elsewhere; AREDS age related eye disease study; ARMD age related macular degeneration; POAG primary open angle glaucoma; EBMD epithelial/anterior basement membrane dystrophy; ACIOL anterior chamber intraocular lens; IOL intraocular lens; PCIOL posterior chamber intraocular lens; Phaco/IOL phacoemulsification with intraocular lens placement; [place] photorefractive keratectomy; LASIK laser assisted in situ keratomileusis; HTN hypertension; DM diabetes mellitus; COPD chronic obstructive pulmonary disease

## 2023-02-06 ENCOUNTER — Ambulatory Visit (INDEPENDENT_AMBULATORY_CARE_PROVIDER_SITE_OTHER): Payer: 59 | Admitting: Ophthalmology

## 2023-02-06 ENCOUNTER — Encounter (INDEPENDENT_AMBULATORY_CARE_PROVIDER_SITE_OTHER): Payer: Self-pay | Admitting: Ophthalmology

## 2023-02-06 ENCOUNTER — Encounter (INDEPENDENT_AMBULATORY_CARE_PROVIDER_SITE_OTHER): Payer: 59 | Admitting: Ophthalmology

## 2023-02-06 DIAGNOSIS — H35033 Hypertensive retinopathy, bilateral: Secondary | ICD-10-CM | POA: Diagnosis not present

## 2023-02-06 DIAGNOSIS — H25813 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-related cataract, bilateral: Secondary | ICD-10-CM | POA: Diagnosis not present

## 2023-02-06 DIAGNOSIS — I10 Essential (primary) hypertension: Secondary | ICD-10-CM | POA: Diagnosis not present

## 2023-02-06 DIAGNOSIS — H34832 Tributary (branch) retinal vein occlusion, left eye, with macular edema: Secondary | ICD-10-CM | POA: Diagnosis not present

## 2023-02-06 DIAGNOSIS — E113293 Type 2 diabetes mellitus with mild nonproliferative diabetic retinopathy without macular edema, bilateral: Secondary | ICD-10-CM

## 2023-02-06 MED ORDER — AFLIBERCEPT 2MG/0.05ML IZ SOLN FOR KALEIDOSCOPE
2.0000 mg | INTRAVITREAL | Status: AC | PRN
  Administered 2023-02-06: 2 mg via INTRAVITREAL

## 2023-02-12 ENCOUNTER — Ambulatory Visit (HOSPITAL_COMMUNITY): Admission: RE | Admit: 2023-02-12 | Payer: 59 | Source: Ambulatory Visit

## 2023-02-17 DIAGNOSIS — I10 Essential (primary) hypertension: Secondary | ICD-10-CM | POA: Diagnosis not present

## 2023-02-17 DIAGNOSIS — Z0001 Encounter for general adult medical examination with abnormal findings: Secondary | ICD-10-CM | POA: Diagnosis not present

## 2023-02-17 DIAGNOSIS — E785 Hyperlipidemia, unspecified: Secondary | ICD-10-CM | POA: Diagnosis not present

## 2023-02-17 DIAGNOSIS — E1122 Type 2 diabetes mellitus with diabetic chronic kidney disease: Secondary | ICD-10-CM | POA: Diagnosis not present

## 2023-02-17 DIAGNOSIS — I5042 Chronic combined systolic (congestive) and diastolic (congestive) heart failure: Secondary | ICD-10-CM | POA: Diagnosis not present

## 2023-02-17 DIAGNOSIS — Z1389 Encounter for screening for other disorder: Secondary | ICD-10-CM | POA: Diagnosis not present

## 2023-02-17 DIAGNOSIS — E1121 Type 2 diabetes mellitus with diabetic nephropathy: Secondary | ICD-10-CM | POA: Diagnosis not present

## 2023-02-18 DIAGNOSIS — I10 Essential (primary) hypertension: Secondary | ICD-10-CM | POA: Diagnosis not present

## 2023-02-18 DIAGNOSIS — E1165 Type 2 diabetes mellitus with hyperglycemia: Secondary | ICD-10-CM | POA: Diagnosis not present

## 2023-02-18 DIAGNOSIS — E785 Hyperlipidemia, unspecified: Secondary | ICD-10-CM | POA: Diagnosis not present

## 2023-02-18 DIAGNOSIS — Z0001 Encounter for general adult medical examination with abnormal findings: Secondary | ICD-10-CM | POA: Diagnosis not present

## 2023-02-27 ENCOUNTER — Other Ambulatory Visit (INDEPENDENT_AMBULATORY_CARE_PROVIDER_SITE_OTHER): Payer: Self-pay

## 2023-02-27 DIAGNOSIS — H25813 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-related cataract, bilateral: Secondary | ICD-10-CM

## 2023-02-27 DIAGNOSIS — E113293 Type 2 diabetes mellitus with mild nonproliferative diabetic retinopathy without macular edema, bilateral: Secondary | ICD-10-CM

## 2023-02-27 DIAGNOSIS — I10 Essential (primary) hypertension: Secondary | ICD-10-CM

## 2023-02-27 DIAGNOSIS — H34832 Tributary (branch) retinal vein occlusion, left eye, with macular edema: Secondary | ICD-10-CM

## 2023-02-27 DIAGNOSIS — H35033 Hypertensive retinopathy, bilateral: Secondary | ICD-10-CM

## 2023-02-27 NOTE — Progress Notes (Shared)
[facility] Note  02/27/2023    CHIEF COMPLAINT Patient presents for No chief complaint on file.   HISTORY OF PRESENT ILLNESS: Michelle Morrison is a 62 y.o. female who presents to the clinic today for:    Patient sees a decrease in vision. She is delayed from 6 weeks to 8 weeks due transportation.   Referring physician: No referring provider defined for this encounter.  HISTORICAL INFORMATION:   Selected notes from the MEDICAL RECORD NUMBER Referred by Dr. [name]: 05.08.23 Ocular Hx- NPDR OS  Patient saw Dr. [name] Feb 2022.  Vision at that time was OD 20/20, OS 20/25    CURRENT MEDICATIONS: No current outpatient medications on file. (Ophthalmic Drugs)   No current facility-administered medications for this visit. (Ophthalmic Drugs)   Current Outpatient Medications (Other)  Medication Sig   albuterol (VENTOLIN HFA) 108 (90 Base) MCG/ACT inhaler Inhale 1-2 puffs into the lungs every 4 (four) hours as needed.   amLODipine (NORVASC) 5 MG tablet Take 2 tablets (10 mg total) by mouth daily.   atorvastatin (LIPITOR) 80 MG tablet Take 1 tablet (80 mg total) by mouth daily.   dapagliflozin propanediol (FARXIGA) 10 MG TABS tablet TAKE 1 TABLET (10 MG TOTAL) BY MOUTH DAILY.   dronedarone (MULTAQ) 400 MG tablet Take 1 tablet (400 mg total) by mouth 2 (two) times daily with a meal.   hydrALAZINE (APRESOLINE) 50 MG tablet Take 1.5 tablets (75 mg total) by mouth 3 (three) times daily.   isosorbide mononitrate (IMDUR) 30 MG 24 hr tablet Take 1 tablet (30 mg total) by mouth daily.   sacubitril-valsartan (ENTRESTO) 97-103 MG Take 1 tablet by mouth 2 (two) times daily.   TRADJENTA 5 MG TABS tablet Take 5 mg by mouth daily.   No current facility-administered medications for this visit. (Other)   REVIEW OF SYSTEMS:   ALLERGIES No Known Allergies  PAST MEDICAL HISTORY Past Medical History:  Diagnosis Date   CHF (congestive heart failure) ([place])    a. EF  45% by echo in 03/2020 with normal cors by cath   Diabetes mellitus without complication ([place])    Diarrhea    Heart disease    Herpes simplex infection    Hyperlipidemia    Hypertension    Past Surgical History:  Procedure Laterality Date   CESAREAN SECTION     CHOLECYSTECTOMY     COLONOSCOPY N/A 08/26/2014   Procedure: COLONOSCOPY;  Surgeon: [name], MD;  [facility];  Service: Endoscopy;  Laterality: N/A;  9:30 AM - moved to 8:30 - Ginger to notify pt   LEFT HEART CATH AND CORONARY ANGIOGRAPHY N/A 04/14/2020   Procedure: LEFT HEART CATH AND CORONARY ANGIOGRAPHY;  Surgeon: [name], [name], MD;  Location: [facility];  Service: Cardiovascular;  Laterality: N/A;   RIGHT/LEFT HEART CATH AND CORONARY ANGIOGRAPHY N/A 02/19/2021   Procedure: RIGHT/LEFT HEART CATH AND CORONARY ANGIOGRAPHY;  Surgeon: [name], MD;  Location: [facility];  Service: Cardiovascular;  Laterality: N/A;   FAMILY HISTORY Family History  Problem Relation Age of Onset   Hypertension Mother    Diabetes Mother    Colon cancer Neg Hx    SOCIAL HISTORY Social History   Tobacco Use   Smoking status: Never   Smokeless tobacco: Never  Vaping Use   Vaping Use: Never used  Substance Use Topics   Alcohol use: No    Alcohol/week: 0.0 standard drinks of  alcohol   Drug use: No       OPHTHALMIC EXAM:  Not recorded    IMAGING AND PROCEDURES  Imaging and Procedures for 02/27/2023          ASSESSMENT/PLAN:    ICD-10-CM   1. Branch retinal vein occlusion of left eye with macular edema  H34.8320     2. Essential hypertension  I10     3. Hypertensive retinopathy of both eyes  H35.033     4. Mild nonproliferative diabetic retinopathy of both eyes without macular edema associated with type 2 diabetes mellitus  E11.3293     5. 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-related cataract of both eyes  H25.813      BRVO w/ CME OS - delayed f/u: 8 wks instead of 6 (transportation) - by  history, likely onset around Dec 2022, but delayed presentation to May 2023 - FA 5.17.23 shows: Focal cluster of leakage superior mac and perifovea, ?macroaneurysm - s/p IVA OS #1 (05.17.23), #2 (06.14.23), #3 (08.02.23), #4 (09.06.23), #5 (10.04.23), #6 (11.06.23) -- IVA resistance - s/p IVE OS #1 (12.08.23), #2 (01.17.24), #3 (03.14.24) - BCVA 20/60 -- stable - OCT shows OS: BRVO w/ CME - interval increase in IRF/IRHM  superior fovea and macula at 8 weeks - recommend IVE OS #4 today, 04.25.24 w/ f/u in 6 wks - pt wishes to proceed with injection   - RBA of procedure discussed, questions answered - IVA informed consent obtained and signed, 05.17.23 (OS) - IVE informed consent obtained and signed, 12.08.23 (OS) - see procedure note - F/U 6 weeks -- DFE/OCT/possible injection   2,3. Hypertensive retinopathy OU - discussed importance of tight BP control - monitor  4. 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   - The need for close monitoring of blood glucose, blood pressure, and serum lipids, avoiding cigarette or any type of tobacco, and the need for long term follow up was also discussed with patient. - exam shows rare MA OU  - FA (05.17.23) shows rare MA OU; no NV OU -- BRVO OS as above - OCT OD: NFP; Focal cystic changes and IRHM in temporal macula -- slightly increased - BCVA remains 20/20 OD - will continue to hold off on anti-VEGF therapy OD for now  - monitor  5. Mixed Cataract OU - The symptoms of cataract, surgical options, and treatments and risks were discussed with patient. - discussed diagnosis and progression - monitor   Ophthalmic Meds Ordered this visit:  No orders of the defined types were placed in this encounter.    No follow-ups on file.  There are no Patient Instructions on file for this  visit.   Explained the diagnoses, plan, and follow up with the patient and they expressed understanding.  Patient expressed understanding of the importance of proper follow up care.   This document serves as a record of services personally performed by Gardiner Sleeper, MD, PhD. It was created on their behalf by San Jetty. Owens Shark, OA an ophthalmic technician. The creation of this record is the provider's dictation and/or activities during the visit.    Electronically signed by: San Jetty. Owens Shark, [place] 04.04.2024 1:57 PM   Gardiner Sleeper, M.D., Ph.D. Diseases & Surgery of the Retina and Vitreous [facility]: M myopia (nearsighted); A astigmatism; H hyperopia (farsighted); P presbyopia; Mrx  spectacle prescription;  CTL contact lenses; OD right eye; OS left eye; OU both eyes  XT exotropia; ET esotropia; PEK punctate epithelial keratitis; PEE punctate epithelial erosions; DES dry eye syndrome; MGD meibomian gland dysfunction; ATs artificial tears; PFAT's preservative free artificial tears; [place] nuclear sclerotic cataract; PSC posterior subcapsular cataract; ERM epi-retinal membrane; PVD posterior vitreous detachment; RD retinal detachment; DM diabetes mellitus; DR diabetic retinopathy; NPDR non-proliferative diabetic retinopathy; PDR proliferative diabetic retinopathy; CSME clinically significant macular edema; DME diabetic macular edema; dbh dot blot hemorrhages; CWS cotton wool spot; POAG primary open angle glaucoma; C/D cup-to-disc ratio; HVF humphrey visual field; GVF goldmann visual field; OCT optical coherence tomography; IOP intraocular pressure; BRVO Branch retinal vein occlusion; CRVO central retinal vein occlusion; CRAO central retinal artery occlusion; BRAO branch retinal artery occlusion; RT retinal tear; SB scleral buckle; PPV pars plana vitrectomy; VH Vitreous hemorrhage; PRP panretinal laser photocoagulation; IVK intravitreal kenalog; VMT vitreomacular  traction; MH Macular hole;  NVD neovascularization of the disc; NVE neovascularization elsewhere; AREDS age related eye disease study; ARMD age related macular degeneration; POAG primary open angle glaucoma; EBMD epithelial/anterior basement membrane dystrophy; ACIOL anterior chamber intraocular lens; IOL intraocular lens; PCIOL posterior chamber intraocular lens; Phaco/IOL phacoemulsification with intraocular lens placement; [place] photorefractive keratectomy; LASIK laser assisted in situ keratomileusis; HTN hypertension; DM diabetes mellitus; COPD chronic obstructive pulmonary disease

## 2023-03-07 NOTE — Progress Notes (Signed)
[facility] - Clinic Note  03/20/2023    CHIEF COMPLAINT Patient presents for Retina Follow Up   HISTORY OF PRESENT ILLNESS: Michelle Morrison is a 62 y.o. female who presents to the clinic today for:   HPI     Retina Follow Up   Patient presents with  CRVO/BRVO.  In left eye.  This started years ago.  Duration of 6 weeks.  I, the attending physician,  performed the HPI with the patient and updated documentation appropriately.        Comments   Patient feels that the vision is the same. She is not using any eye drops at this time. She does not check her sugars daily.       Last edited by [name], MD on 03/20/2023 10:14 AM.     Patient sees a decrease in vision. She is delayed from 6 weeks to 8 weeks due transportation.   Referring physician: [name], MD [address] [place],  [place] [address]  HISTORICAL INFORMATION:   Selected notes from the MEDICAL RECORD NUMBER Referred by Dr. [name]: 05.08.23 Ocular Hx- NPDR OS  Patient saw Dr. [name] Feb 2022.  Vision at that time was OD 20/20, OS 20/25    CURRENT MEDICATIONS: No current outpatient medications on file. (Ophthalmic Drugs)   No current facility-administered medications for this visit. (Ophthalmic Drugs)   Current Outpatient Medications (Other)  Medication Sig   albuterol (VENTOLIN HFA) 108 (90 Base) MCG/ACT inhaler Inhale 1-2 puffs into the lungs every 4 (four) hours as needed.   amLODipine (NORVASC) 5 MG tablet Take 2 tablets (10 mg total) by mouth daily.   atorvastatin (LIPITOR) 80 MG tablet Take 1 tablet (80 mg total) by mouth daily.   dapagliflozin propanediol (FARXIGA) 10 MG TABS tablet TAKE 1 TABLET (10 MG TOTAL) BY MOUTH DAILY.   dronedarone (MULTAQ) 400 MG tablet Take 1 tablet (400 mg total) by mouth 2 (two) times daily with a meal.   hydrALAZINE (APRESOLINE) 50 MG tablet Take 1.5 tablets (75 mg total) by mouth 3 (three) times daily.   isosorbide  mononitrate (IMDUR) 30 MG 24 hr tablet Take 1 tablet (30 mg total) by mouth daily.   sacubitril-valsartan (ENTRESTO) 97-103 MG Take 1 tablet by mouth 2 (two) times daily.   TRADJENTA 5 MG TABS tablet Take 5 mg by mouth daily.   No current facility-administered medications for this visit. (Other)   REVIEW OF SYSTEMS: ROS   Positive for: Endocrine, Cardiovascular, Eyes, Respiratory Negative for: Constitutional, Gastrointestinal, Neurological, Skin, Genitourinary, Musculoskeletal, HENT, Psychiatric, Allergic/Imm, Heme/Lymph Last edited by Julieanne Cotton, COT on 03/20/2023  9:10 AM.      ALLERGIES No Known Allergies  PAST MEDICAL HISTORY Past Medical History:  Diagnosis Date   CHF (congestive heart failure)    a. EF 45% by echo in 03/2020 with normal cors by cath   Diabetes mellitus without complication    Diarrhea    Heart disease    Herpes simplex infection    Hyperlipidemia    Hypertension    Past Surgical History:  Procedure Laterality Date   CESAREAN SECTION     CHOLECYSTECTOMY     COLONOSCOPY N/A 08/26/2014   Procedure: COLONOSCOPY;  Surgeon: West Bali, MD;  Location: [facility];  Service: Endoscopy;  Laterality: N/A;  9:30 AM - moved to 8:30 - Ginger to notify pt   LEFT HEART CATH AND CORONARY ANGIOGRAPHY N/A 04/14/2020   Procedure:  LEFT HEART CATH AND CORONARY ANGIOGRAPHY;  Surgeon: Swazil[place], [name], MD;  Location: [facility] INVASIVE CV LAB;  Service: Cardiovascular;  Laterality: N/A;   RIGHT/LEFT HEART CATH AND CORONARY ANGIOGRAPHY N/A 02/19/2021   Procedure: RIGHT/LEFT HEART CATH AND CORONARY ANGIOGRAPHY;  Surgeon: Laurey Morale, MD;  Location: [facility] INVASIVE CV LAB;  Service: Cardiovascular;  Laterality: N/A;   FAMILY HISTORY Family History  Problem Relation Age of Onset   Hypertension Mother    Diabetes Mother    Colon cancer Neg Hx    SOCIAL HISTORY Social History   Tobacco Use   Smoking status: Never   Smokeless tobacco: Never  Vaping Use   Vaping  Use: Never used  Substance Use Topics   Alcohol use: No    Alcohol/week: 0.0 standard drinks of alcohol   Drug use: No       OPHTHALMIC EXAM:  Base Eye Exam     Visual Acuity (Snellen - Linear)       Right Left   Dist [place] 20/20 20/40 -2   Dist ph   NI         Tonometry (Tonopen, 9:13 AM)       Right Left   Pressure 17 15         Pupils       Dark Light Shape React APD   Right 2 1 Round Minimal None   Left 2 1 Round Minimal None         Visual Fields       Left Right    Full Full         Extraocular Movement       Right Left    Full, Ortho Full, Ortho         Neuro/Psych     Oriented x3: Yes   Mood/Affect: Normal         Dilation     Both eyes: 1.0% Mydriacyl, 2.5% Phenylephrine @ 9:10 AM           Slit Lamp and Fundus Exam     Slit Lamp Exam       Right Left   Lids/Lashes Dermatochalasis - upper lid, Meibomian gland dysfunction Dermatochalasis - upper lid, Meibomian gland dysfunction   Conjunctiva/Sclera melanosis melanosis   Cornea trace PEE trace PEE   Anterior Chamber deep, narrow temporal angle deep, narrow temporal angle   Iris Round and dilated, No NVI Round and dilated, No NVI   Lens 2+ Nuclear sclerosis, 2+ Cortical cataract 2+ Nuclear sclerosis, 2+ Cortical cataract   Anterior Vitreous Vitreous syneresis Vitreous syneresis         Fundus Exam       Right Left   Disc Pink and sharp, PPP Pink and sharp, PPP   C/D Ratio 0.6 0.6   Macula Flat, Good foveal reflex, trace cystic changes temporal to fovea Blunted foveal reflex, central edema -- slightly improved, perifoveal exudates, cluster of IRH superior macula -- improving   Vessels Attenuated, Tortuous Attenuated, Tortuous, Copper wiring, 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 Central Foveal Thickness: 231. Progression has been stable. Findings include normal foveal contour, no SRF, intraretinal hyper-reflective material, vitreomacular adhesion (Focal cystic changes and IRHM IT macula --  persistent).   Left Eye Quality was good. Central Foveal Thickness: 354. Progression has improved. Findings include no SRF, abnormal foveal contour, subretinal hyper-reflective material, intraretinal hyper-reflective material, intraretinal fluid, vitreomacular adhesion (interval improvement in IRF/IRHM superior fovea and macula ).   Notes *Images captured and stored on drive  Diagnosis / Impression:  OD: NFP; Focal cystic changes and IRHM IT macula -- persistent OS: BRVO w/ CME - interval improvement in IRF/IRHM  superior fovea and macula   Clinical management:  See below  Abbreviations: NFP - Normal foveal profile. CME - cystoid macular edema. PED - pigment epithelial detachment. IRF - intraretinal fluid. SRF - subretinal fluid. EZ - ellipsoid zone. ERM - epiretinal membrane. ORA - outer retinal atrophy. ORT - outer retinal tubulation. SRHM - subretinal hyper-reflective material. IRHM - intraretinal hyper-reflective material      Intravitreal Injection, Pharmacologic Agent - OS - Left Eye       Time Out 03/20/2023. 9:23 AM. Confirmed correct patient, procedure, site, and patient consented.   Anesthesia Topical anesthesia was used. Anesthetic medications included Lidocaine 2%, Proparacaine 0.5%.   Procedure Preparation included 5% betadine to ocular surface, eyelid speculum. A (32g) needle was used.   Injection: 2 mg aflibercept 2 MG/0.05ML   Route: Intravitreal, Site: Left Eye   NDC: L6038910, Lot: [id number], Expiration date: 02/23/2024, Waste: 0 mL   Post-op Post injection exam found visual acuity of at least counting fingers. The patient tolerated the procedure well. There were no complications. The patient received written and verbal post procedure  care education. Post injection medications were not given.             ASSESSMENT/PLAN:    ICD-10-CM   1. Branch retinal vein occlusion of left eye with macular edema  H34.8320 OCT, Retina - OU - Both Eyes    Intravitreal Injection, Pharmacologic Agent - OS - Left Eye    aflibercept (EYLEA) SOLN 2 mg    2. Essential hypertension  I10     3. Hypertensive retinopathy of both eyes  H35.033     4. Mild nonproliferative diabetic retinopathy of both eyes without macular edema associated with type 2 diabetes mellitus  E11.3293     5. Long term (current) use of oral hypoglycemic drugs  Z79.84     6. 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-related cataract of both eyes  H25.813      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, ?macroaneurysm - s/p IVA OS #1 (05.17.23), #2 (06.14.23), #3 (08.02.23), #4 (09.06.23), #5 (10.04.23), #6 (11.06.23) -- IVA resistance - s/p IVE OS #1 (12.08.23), #2 (01.17.24), #3 (03.14.24) - BCVA 20/40 from 20/60 - OCT shows OS: interval improvment in IRF/IRHM  superior fovea and macula at 6 weeks - recommend IVE OS #4 today, 04.25.24 w/ f/u in 6 wks - pt wishes to proceed with injection   - RBA of procedure discussed, questions answered - IVA informed consent obtained and signed, 05.17.23 (OS) - IVE informed consent obtained and signed, 12.08.23 (OS) - see procedure note - F/U 6 weeks -- DFE/OCT/possible injection   2,3. Hypertensive retinopathy OU - discussed importance of tight BP control - monitor  4,5. Mild nonproliferative diabetic retinopathy w/o DME OD - The incidence, risk factors for progression, natural history and treatment options for diabetic retinopathy were discussed with patient.   - The need for close monitoring of blood  glucose, blood pressure, and serum lipids, avoiding cigarette or any type of tobacco, and the need for long term  follow up was also discussed with patient. - exam shows rare MA OU  - FA (05.17.23) shows rare MA OU; no NV OU -- BRVO OS as above - OCT OD: NFP; Focal cystic changes and IRHM in temporal macula -- persistent - BCVA remains 20/20 OD - will continue to hold off on anti-VEGF therapy OD for now  - monitor  6. Mixed Cataract OU - The symptoms of cataract, surgical options, and treatments and risks were discussed with patient. - discussed diagnosis and progression - monitor   Ophthalmic Meds Ordered this visit:  Meds ordered this encounter  Medications   aflibercept (EYLEA) SOLN 2 mg     Return in about 6 weeks (around 05/01/2023) for f/u BRVO OS, DFE, OCT.  There are no Patient Instructions on file for this visit.   Explained the diagnoses, plan, and follow up with the patient and they expressed understanding.  Patient expressed understanding of the importance of proper follow up care.   This document serves as a record of services personally performed by Karie Chimera, MD, PhD. It was created on their behalf by Glee Arvin. Manson Passey, OA an ophthalmic technician. The creation of this record is the provider's dictation and/or activities during the visit.    Electronically signed by: Glee Arvin. Manson Passey, [place] 04.04.2024 10:29 AM  Karie Chimera, M.D., Ph.D. Diseases & Surgery of the Retina and Vitreous [facility]  I have reviewed the above documentation for accuracy and completeness, and I agree with the above. Karie Chimera, M.D., Ph.D. 03/20/23 10:29 AM  Abbreviations: M myopia (nearsighted); A astigmatism; H hyperopia (farsighted); P presbyopia; Mrx spectacle prescription;  CTL contact lenses; OD right eye; OS left eye; OU both eyes  XT exotropia; ET esotropia; PEK punctate epithelial keratitis; PEE punctate epithelial erosions; DES dry eye syndrome; MGD meibomian gland dysfunction; ATs artificial tears; PFAT's preservative free artificial tears; NSC nuclear sclerotic  cataract; PSC posterior subcapsular cataract; ERM epi-retinal membrane; PVD posterior vitreous detachment; RD retinal detachment; DM diabetes mellitus; DR diabetic retinopathy; NPDR non-proliferative diabetic retinopathy; PDR proliferative diabetic retinopathy; CSME clinically significant macular edema; DME diabetic macular edema; dbh dot blot hemorrhages; CWS cotton wool spot; POAG primary open angle glaucoma; C/D cup-to-disc ratio; HVF humphrey visual field; GVF goldmann visual field; OCT optical coherence tomography; IOP intraocular pressure; BRVO Branch retinal vein occlusion; CRVO central retinal vein occlusion; CRAO central retinal artery occlusion; BRAO branch retinal artery occlusion; RT retinal tear; SB scleral buckle; PPV pars plana vitrectomy; VH Vitreous hemorrhage; PRP panretinal laser photocoagulation; IVK intravitreal kenalog; VMT vitreomacular traction; MH Macular hole;  NVD neovascularization of the disc; NVE neovascularization elsewhere; AREDS age related eye disease study; ARMD age related macular degeneration; POAG primary open angle glaucoma; EBMD epithelial/anterior basement membrane dystrophy; ACIOL anterior chamber intraocular lens; IOL intraocular lens; PCIOL posterior chamber intraocular lens; Phaco/IOL phacoemulsification with intraocular lens placement; PRK photorefractive keratectomy; LASIK laser assisted in situ keratomileusis; HTN hypertension; DM diabetes mellitus; COPD chronic obstructive pulmonary disease

## 2023-03-19 ENCOUNTER — Ambulatory Visit (HOSPITAL_COMMUNITY): Payer: 59

## 2023-03-20 ENCOUNTER — Ambulatory Visit (INDEPENDENT_AMBULATORY_CARE_PROVIDER_SITE_OTHER): Payer: 59 | Admitting: Ophthalmology

## 2023-03-20 ENCOUNTER — Encounter (INDEPENDENT_AMBULATORY_CARE_PROVIDER_SITE_OTHER): Payer: Self-pay | Admitting: Ophthalmology

## 2023-03-20 DIAGNOSIS — Z7984 Long term (current) use of oral hypoglycemic drugs: Secondary | ICD-10-CM

## 2023-03-20 DIAGNOSIS — I10 Essential (primary) hypertension: Secondary | ICD-10-CM | POA: Diagnosis not present

## 2023-03-20 DIAGNOSIS — H35033 Hypertensive retinopathy, bilateral: Secondary | ICD-10-CM

## 2023-03-20 DIAGNOSIS — H34832 Tributary (branch) retinal vein occlusion, left eye, with macular edema: Secondary | ICD-10-CM

## 2023-03-20 DIAGNOSIS — I5042 Chronic combined systolic (congestive) and diastolic (congestive) heart failure: Secondary | ICD-10-CM | POA: Diagnosis not present

## 2023-03-20 DIAGNOSIS — E113293 Type 2 diabetes mellitus with mild nonproliferative diabetic retinopathy without macular edema, bilateral: Secondary | ICD-10-CM

## 2023-03-20 DIAGNOSIS — H25813 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-related cataract, bilateral: Secondary | ICD-10-CM

## 2023-03-20 MED ORDER — AFLIBERCEPT 2MG/0.05ML IZ SOLN FOR KALEIDOSCOPE
2.0000 mg | INTRAVITREAL | Status: AC | PRN
  Administered 2023-03-20: 2 mg via INTRAVITREAL

## 2023-04-14 ENCOUNTER — Ambulatory Visit (HOSPITAL_COMMUNITY)
Admission: RE | Admit: 2023-04-14 | Discharge: 2023-04-14 | Disposition: A | Discharge status: Home / Self Care | Payer: 59 | Source: Ambulatory Visit | Attending: Cardiology | Admitting: Cardiology

## 2023-04-14 DIAGNOSIS — I428 Other cardiomyopathies: Secondary | ICD-10-CM

## 2023-04-14 DIAGNOSIS — I5022 Chronic systolic (congestive) heart failure: Secondary | ICD-10-CM | POA: Diagnosis not present

## 2023-04-14 LAB — ECHOCARDIOGRAM COMPLETE
AR max vel: 1.81 cm2
AV Area VTI: 2.02 cm2
AV Area mean vel: 2.13 cm2
AV Mean grad: 4 mmHg
AV Peak grad: 8.1 mmHg
Ao pk vel: 1.42 m/s
Area-P 1/2: 5.84 cm2
Est EF: 45
S' Lateral: 2.5 cm

## 2023-04-14 NOTE — Progress Notes (Signed)
*  PRELIMINARY RESULTS* Echocardiogram 2D Echocardiogram has been performed.  Stacey Drain 04/14/2023, 9:10 AM

## 2023-04-19 DIAGNOSIS — I5042 Chronic combined systolic (congestive) and diastolic (congestive) heart failure: Secondary | ICD-10-CM | POA: Diagnosis not present

## 2023-04-19 DIAGNOSIS — I10 Essential (primary) hypertension: Secondary | ICD-10-CM | POA: Diagnosis not present

## 2023-04-21 DIAGNOSIS — M25561 Pain in right knee: Secondary | ICD-10-CM | POA: Diagnosis not present

## 2023-04-21 DIAGNOSIS — M25562 Pain in left knee: Secondary | ICD-10-CM | POA: Diagnosis not present

## 2023-04-21 DIAGNOSIS — M25571 Pain in right ankle and joints of right foot: Secondary | ICD-10-CM | POA: Diagnosis not present

## 2023-04-25 ENCOUNTER — Ambulatory Visit: Payer: 59 | Attending: Cardiology | Admitting: Cardiology

## 2023-04-25 ENCOUNTER — Ambulatory Visit (INDEPENDENT_AMBULATORY_CARE_PROVIDER_SITE_OTHER): Payer: 59

## 2023-04-25 ENCOUNTER — Encounter: Payer: Self-pay | Admitting: Cardiology

## 2023-04-25 VITALS — BP 136/62 | HR 68 | Ht 67.0 in | Wt 174.0 lb

## 2023-04-25 DIAGNOSIS — I4589 Other specified conduction disorders: Secondary | ICD-10-CM | POA: Diagnosis not present

## 2023-04-25 DIAGNOSIS — R002 Palpitations: Secondary | ICD-10-CM

## 2023-04-25 DIAGNOSIS — I4719 Other supraventricular tachycardia: Secondary | ICD-10-CM

## 2023-04-25 NOTE — Patient Instructions (Addendum)
Medication Instructions:  Your physician recommends that you continue on your current medications as directed. Please refer to the Current Medication list given to you today.  *If you need a refill on your cardiac medications before your next appointment, please call your pharmacy*   Lab Work: None ordered If you have labs (blood work) drawn today and your tests are completely normal, you will receive your results only by: MyChart Message (if you have MyChart) OR A paper copy in the mail If you have any lab test that is abnormal or we need to change your treatment, we will call you to review the results.   Testing/Procedures:                           Christena Deem- Long Term Monitor Instructions  Your physician has requested you wear a ZIO patch monitor for 7 days.  This is a single patch monitor. [employer] supplies one patch monitor per enrollment. Additional stickers are not available. Please do not apply patch if you will be having a Nuclear Stress Test,  Echocardiogram, Cardiac CT, MRI, or Chest Xray during the period you would be wearing the  monitor. The patch cannot be worn during these tests. You cannot remove and re-apply the  ZIO XT patch monitor.  Your ZIO patch monitor will be mailed 3 day USPS to your address on file. It may take 3-5 days  to receive your monitor after you have been enrolled.  Once you have received your monitor, please review the enclosed instructions. Your monitor  has already been registered assigning a specific monitor serial # to you.  Billing and Patient Assistance Program Information  We have supplied [employer] with any of your insurance information on file for billing purposes. [employer] offers a sliding scale Patient Assistance Program for patients that do not have  insurance, or whose insurance does not completely cover the cost of the ZIO monitor.  You must apply for the Patient Assistance Program to qualify for this discounted rate.  To apply, please  call [employer] at [phone], select option 4, select option 2, ask to apply for  Patient Assistance Program. Michelle Morrison will ask your household income, and how many people  are in your household. They will quote your out-of-pocket cost based on that information.  [employer] will also be able to set up a 42-month, interest-free payment plan if needed.  Applying the monitor   Shave hair from upper left chest.  Hold abrader disc by orange tab. Rub abrader in 40 strokes over the upper left chest as  indicated in your monitor instructions.  Clean area with 4 enclosed alcohol pads. Let dry.  Apply patch as indicated in monitor instructions. Patch will be placed under collarbone on left  side of chest with arrow pointing upward.  Rub patch adhesive wings for 2 minutes. Remove white label marked "1". Remove the white  label marked "2". Rub patch adhesive wings for 2 additional minutes.  While looking in a mirror, press and release button in center of patch. A small green light will  flash 3-4 times. This will be your only indicator that the monitor has been turned on.  Do not shower for the first 24 hours. You may shower after the first 24 hours.  Press the button if you feel a symptom. You will hear a small click. Record Date, Time and  Symptom in the Patient Logbook.  When you are ready to remove the  patch, follow instructions on the last 2 pages of Patient  Logbook. Stick patch monitor onto the last page of Patient Logbook.  Place Patient Logbook in the blue and white box. Use locking tab on box and tape box closed  securely. The blue and white box has prepaid postage on it. Please place it in the mailbox as  soon as possible. Your physician should have your test results approximately 7 days after the  monitor has been mailed back to [facility].  Call [facility] Customer Care at [phone] if you have questions regarding  your ZIO XT patch monitor. Call them immediately if you see an  orange light blinking on your  monitor.  If your monitor falls off in less than 4 days, contact our Monitor department at [phone].  If your monitor becomes loose or falls off after 4 days call [employer] at [phone] for  suggestions on securing your monitor   Follow-Up: At [facility], you and your health needs are our priority.  As part of our continuing mission to provide you with exceptional heart care, we have created designated Provider Care Teams.  These Care Teams include your primary Cardiologist (physician) and Advanced Practice Providers (APPs -  Physician Assistants and Nurse Practitioners) who all work together to provide you with the care you need, when you need it.  Your next appointment:   1 year(s)  The format for your next appointment:   In Person  Provider:   You will see one of the following Advanced Practice Providers on your designated Care Team:   Francis Dowse, [place] Casimiro Needle "Mardelle Matte" Lanna Poche, [place]   Thank you for choosing [facility]!!   Dory Horn, RN [phone]

## 2023-04-25 NOTE — Progress Notes (Unsigned)
Enrolled patient for a 7 day Zio XT monitor to be mailed to patients home.  

## 2023-04-25 NOTE — Progress Notes (Signed)
Electrophysiology Office Note   Date:  04/25/2023   ID:  Michelle Morrison, DOB 02/05/1961, MRN [id number]  PCP:  Michelle Spar, MD  Cardiologist:  Michelle Morrison Primary Electrophysiologist:  Michelle Massey Jorja Loa, MD    Chief Complaint: SVT   History of Present Illness: Michelle Morrison is a 62 y.o. female who is being seen today for the evaluation of SVT at the request of [name], [name]*. Presenting today for electrophysiology evaluation.  She has a history of significant hypertension, SVT, nonischemic cardiomyopathy.  May 2021 she had an ejection fraction 45% with severe anteroseptal hypokinesis.  Left heart catheterization showed normal coronary arteries.  She has a left bundle branch block at baseline.  She has SVT as well.  She was started on amiodarone for atrial tachycardia.  Her ejection fraction normalized.  She has been switched to Multaq.  Today, denies symptoms of palpitations, chest pain, shortness of breath, orthopnea, PND, lower extremity edema, claudication, dizziness, presyncope, syncope, bleeding, or neurologic sequela. The patient is tolerating medications without difficulties.     Past Medical History:  Diagnosis Date   CHF (congestive heart failure) (HCC)    a. EF 45% by echo in 03/2020 with normal cors by cath   Diabetes mellitus without complication (HCC)    Diarrhea    Heart disease    Herpes simplex infection    Hyperlipidemia    Hypertension    Past Surgical History:  Procedure Laterality Date   CESAREAN SECTION     CHOLECYSTECTOMY     COLONOSCOPY N/A 08/26/2014   Procedure: COLONOSCOPY;  Surgeon: West Bali, MD;  Location: [facility];  Service: Endoscopy;  Laterality: N/A;  9:30 AM - moved to 8:30 - Ginger to notify pt   LEFT HEART CATH AND CORONARY ANGIOGRAPHY N/A 04/14/2020   Procedure: LEFT HEART CATH AND CORONARY ANGIOGRAPHY;  Surgeon: Swaziland, [name], MD;  Location: [facility] INVASIVE CV LAB;  Service: Cardiovascular;  Laterality: N/A;    RIGHT/LEFT HEART CATH AND CORONARY ANGIOGRAPHY N/A 02/19/2021   Procedure: RIGHT/LEFT HEART CATH AND CORONARY ANGIOGRAPHY;  Surgeon: Laurey Morale, MD;  Location: [facility] INVASIVE CV LAB;  Service: Cardiovascular;  Laterality: N/A;     Current Outpatient Medications  Medication Sig Dispense Refill   albuterol (VENTOLIN HFA) 108 (90 Base) MCG/ACT inhaler Inhale 1-2 puffs into the lungs every 4 (four) hours as needed.     amLODipine (NORVASC) 5 MG tablet Take 2 tablets (10 mg total) by mouth daily. 90 tablet 3   atorvastatin (LIPITOR) 80 MG tablet Take 1 tablet (80 mg total) by mouth daily. 90 tablet 3   dapagliflozin propanediol (FARXIGA) 10 MG TABS tablet TAKE 1 TABLET (10 MG TOTAL) BY MOUTH DAILY. 30 tablet 6   diclofenac Sodium (VOLTAREN) 1 % GEL Apply topically.     dronedarone (MULTAQ) 400 MG tablet Take 1 tablet (400 mg total) by mouth 2 (two) times daily with a meal. 180 tablet 3   hydrALAZINE (APRESOLINE) 50 MG tablet Take 1.5 tablets (75 mg total) by mouth 3 (three) times daily. 405 tablet 3   isosorbide mononitrate (IMDUR) 30 MG 24 hr tablet Take 1 tablet (30 mg total) by mouth daily. 90 tablet 3   sacubitril-valsartan (ENTRESTO) 97-103 MG Take 1 tablet by mouth 2 (two) times daily. 60 tablet 11   TRADJENTA 5 MG TABS tablet Take 5 mg by mouth daily.     No current facility-administered medications for this visit.    Allergies:  Patient has no known allergies.   Social History:  The patient  reports that she has never smoked. She has never used smokeless tobacco. She reports that she does not drink alcohol and does not use drugs.   Family History:  The patient's family history includes Diabetes in her mother; Hypertension in her mother.   ROS:  Please see the history of present illness.   Otherwise, review of systems is positive for none.   All other systems are reviewed and negative.   PHYSICAL EXAM: VS:  BP 136/62   Pulse 68   Ht 5\' 7"  (1.702 m)   Wt 174 lb (78.9 kg)   LMP  05/23/2007   SpO2 98%   BMI 27.25 kg/m  , BMI Body mass index is 27.25 kg/m. GEN: Well nourished, well developed, in no acute distress  HEENT: normal  Neck: no JVD, carotid bruits, or masses Cardiac: RRR; no murmurs, rubs, or gallops,no edema  Respiratory:  clear to auscultation bilaterally, normal work of breathing GI: soft, nontender, nondistended, + BS MS: no deformity or atrophy  Skin: warm and dry Neuro:  Strength and sensation are intact Psych: euthymic mood, full affect  EKG:  EKG is ordered today. Personal review of the ekg ordered shows ectopic atrial rhythm, intermittent junctional escape  Recent Labs: 01/06/2023: BUN 27; Creatinine, Ser 1.01; Potassium 3.7; Sodium 136    Lipid Panel     Component Value Date/Time   CHOL 172 04/14/2020 0903   TRIG 52 04/14/2020 0903   HDL 62 04/14/2020 0903   CHOLHDL 2.8 04/14/2020 0903   VLDL 10 04/14/2020 0903   LDLCALC 100 (H) 04/14/2020 0903     Wt Readings from Last 3 Encounters:  04/25/23 174 lb (78.9 kg)  01/09/23 173 lb 12.8 oz (78.8 kg)  06/24/22 171 lb 6.4 oz (77.7 kg)      Other studies Reviewed: Additional studies/ records that were reviewed today include: TTE 08/06/21  Review of the above records today demonstrates:   1. Left ventricular ejection fraction, by estimation, is 55 to 60%. The  left ventricle has normal function. The left ventricle has no regional  wall motion abnormalities. There is mild left ventricular hypertrophy.  Left ventricular diastolic parameters  are consistent with Grade I diastolic dysfunction (impaired relaxation).   2. Right ventricular systolic function is normal. The right ventricular  size is normal. There is normal pulmonary artery systolic pressure. The  estimated right ventricular systolic pressure is 21.1 mmHg.   3. The mitral valve is normal in structure. No evidence of mitral valve  regurgitation. No evidence of mitral stenosis.   4. The aortic valve is tricuspid. Aortic  valve regurgitation is not  visualized. No aortic stenosis is present.   5. The inferior vena cava is normal in size with greater than 50%  respiratory variability, suggesting right atrial pressure of 3 mmHg.   RHC/LHC 02/19/21 1. Low filling pressures.  2. Preserved cardiac output.  3. No significant CAD.   ASSESSMENT AND PLAN:  1.  Chronic systolic heart failure: Due to nonischemic cardiomyopathy.  Most recent echo shows a normalized ejection fraction.  Likely tachycardia mediated due to atrial tachycardia.  Currently on Multaq.  Continue with current management.  2.  Atrial tachycardia: Currently on Multaq.  Has had no further episodes.  She does have some evidence of A-V dissociation with PACs and a low atrial rhythm.  Eleanore Junio have her wear a 7-day monitor.  Fortunately QRS remains stable with conducted and  nonconducted beats.  3.  Hypertension: Currently well-controlled   Current medicines are reviewed at length with the patient today.   The patient does not have concerns regarding her medicines.  The following changes were made today: None  Labs/ tests ordered today include:  Orders Placed This Encounter  Procedures   LONG TERM MONITOR (3-14 DAYS)   EKG 12-Lead     Disposition:   FU 12 months  Signed, Jaquesha Boroff Jorja Loa, MD  04/25/2023 11:02 AM     [facility] [address] [place] [address] [phone] (office) [phone] (fax)

## 2023-04-29 DIAGNOSIS — I4589 Other specified conduction disorders: Secondary | ICD-10-CM

## 2023-04-29 DIAGNOSIS — R002 Palpitations: Secondary | ICD-10-CM

## 2023-04-30 NOTE — Progress Notes (Signed)
[facility] - Clinic Note  05/02/2023    CHIEF COMPLAINT Patient presents for Retina Follow Up   HISTORY OF PRESENT ILLNESS: Michelle Morrison is a 62 y.o. female who presents to the clinic today for:   HPI     Retina Follow Up   Patient presents with  CRVO/BRVO.  In left eye.  This started 6 weeks ago.  Severity is moderate.  Duration of 6 weeks.  Since onset it is stable.        Comments   6 week retina follow up BRVO OS pt is reporting no vision changes noticed she denies any flashes or floaters       Last edited by Etheleen Mayhew, COT on 05/02/2023  9:02 AM.       Referring physician: [name], MD [address] [place],  [place] [address]  HISTORICAL INFORMATION:   Selected notes from the MEDICAL RECORD NUMBER Referred by Dr. [name]: 05.08.23 Ocular Hx- NPDR OS  Patient saw Dr. [name] Feb 2022.  Vision at that time was OD 20/20, OS 20/25    CURRENT MEDICATIONS: No current outpatient medications on file. (Ophthalmic Drugs)   No current facility-administered medications for this visit. (Ophthalmic Drugs)   Current Outpatient Medications (Other)  Medication Sig   albuterol (VENTOLIN HFA) 108 (90 Base) MCG/ACT inhaler Inhale 1-2 puffs into the lungs every 4 (four) hours as needed.   amLODipine (NORVASC) 5 MG tablet Take 2 tablets (10 mg total) by mouth daily.   atorvastatin (LIPITOR) 80 MG tablet Take 1 tablet (80 mg total) by mouth daily.   dapagliflozin propanediol (FARXIGA) 10 MG TABS tablet TAKE 1 TABLET (10 MG TOTAL) BY MOUTH DAILY.   diclofenac Sodium (VOLTAREN) 1 % GEL Apply topically.   dronedarone (MULTAQ) 400 MG tablet Take 1 tablet (400 mg total) by mouth 2 (two) times daily with a meal.   hydrALAZINE (APRESOLINE) 50 MG tablet Take 1.5 tablets (75 mg total) by mouth 3 (three) times daily.   isosorbide mononitrate (IMDUR) 30 MG 24 hr tablet Take 1 tablet (30 mg total) by mouth daily.    sacubitril-valsartan (ENTRESTO) 97-103 MG Take 1 tablet by mouth 2 (two) times daily.   TRADJENTA 5 MG TABS tablet Take 5 mg by mouth daily.   No current facility-administered medications for this visit. (Other)   REVIEW OF SYSTEMS: ROS   Positive for: Endocrine, Cardiovascular, Eyes, Respiratory Negative for: Constitutional, Gastrointestinal, Neurological, Skin, Genitourinary, Musculoskeletal, HENT, Psychiatric, Allergic/Imm, Heme/Lymph Last edited by Etheleen Mayhew, COT on 05/02/2023  9:02 AM.       ALLERGIES No Known Allergies  PAST MEDICAL HISTORY Past Medical History:  Diagnosis Date   CHF (congestive heart failure) (HCC)    a. EF 45% by echo in 03/2020 with normal cors by cath   Diabetes mellitus without complication (HCC)    Diarrhea    Heart disease    Herpes simplex infection    Hyperlipidemia    Hypertension    Past Surgical History:  Procedure Laterality Date   CESAREAN SECTION     CHOLECYSTECTOMY     COLONOSCOPY N/A 08/26/2014   Procedure: COLONOSCOPY;  Surgeon: West Bali, MD;  Location: [facility];  Service: Endoscopy;  Laterality: N/A;  9:30 AM - moved to 8:30 - Ginger to notify pt   LEFT HEART CATH AND CORONARY ANGIOGRAPHY N/A 04/14/2020   Procedure: LEFT HEART CATH AND CORONARY ANGIOGRAPHY;  Surgeon: Swaziland, [name], MD;  Location: [facility] INVASIVE CV LAB;  Service: Cardiovascular;  Laterality: N/A;   RIGHT/LEFT HEART CATH AND CORONARY ANGIOGRAPHY N/A 02/19/2021   Procedure: RIGHT/LEFT HEART CATH AND CORONARY ANGIOGRAPHY;  Surgeon: Laurey Morale, MD;  Location: [facility] INVASIVE CV LAB;  Service: Cardiovascular;  Laterality: N/A;   FAMILY HISTORY Family History  Problem Relation Age of Onset   Hypertension Mother    Diabetes Mother    Colon cancer Neg Hx    SOCIAL HISTORY Social History   Tobacco Use   Smoking status: Never   Smokeless tobacco: Never  Vaping Use   Vaping Use: Never used  Substance Use Topics   Alcohol use: No     Alcohol/week: 0.0 standard drinks of alcohol   Drug use: No       OPHTHALMIC EXAM:  Base Eye Exam     Visual Acuity (Snellen - Linear)       Right Left   Dist [place] 20/20 -2 20/30   Dist ph [place]  NI         Tonometry (Tonopen, 9:06 AM)       Right Left   Pressure 18 17  Squeezing         Pupils       Pupils Dark Light Shape React APD   Right PERRL 2 1 Round Brisk None   Left PERRL 2 1 Round Brisk None         Visual Fields       Left Right    Full Full         Extraocular Movement       Right Left    Full, Ortho Full, Ortho         Neuro/Psych     Oriented x3: Yes   Mood/Affect: Normal         Dilation     Both eyes: 2.5% Phenylephrine @ 9:06 AM           Slit Lamp and Fundus Exam     Slit Lamp Exam       Right Left   Lids/Lashes Dermatochalasis - upper lid, Meibomian gland dysfunction Dermatochalasis - upper lid, Meibomian gland dysfunction   Conjunctiva/Sclera melanosis melanosis   Cornea trace PEE trace PEE   Anterior Chamber deep, narrow temporal angle deep, narrow temporal angle   Iris Round and dilated, No NVI Round and dilated, No NVI   Lens 2+ Nuclear sclerosis, 2+ Cortical cataract 2+ Nuclear sclerosis, 2+ Cortical cataract   Anterior Vitreous Vitreous syneresis Vitreous syneresis         Fundus Exam       Right Left   Disc Pink and sharp, PPP Pink and sharp, PPP   C/D Ratio 0.6 0.6   Macula Flat, Good foveal reflex, trace cystic changes temporal to fovea Blunted foveal reflex, central edema -- slightly improved, perifoveal exudates, cluster of IRH superior macula -- improving   Vessels Attenuated, Tortuous Attenuated, Tortuous, Copper wiring, 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 Branch retinal vein occlusion of left eye with macular  edema  H34.8320 OCT, Retina - OU - Both Eyes    2. Essential hypertension  I10     3. Hypertensive retinopathy of both eyes  H35.033  4. 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 Long term (current) use of oral hypoglycemic drugs  Z79.84     6. 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-related cataract of both eyes  H25.813      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, ?macroaneurysm - s/p IVA OS #1 (05.17.23), #2 (06.14.23), #3 (08.02.23), #4 (09.06.23), #5 (10.04.23), #6 (11.06.23) -- IVA resistance ========================================================== - s/p IVE OS #1 (12.08.23), #2 (01.17.24), #3 (03.14.24), #4 (04.25.24) - BCVA 20/40 from 20/60 - IVE OS #5 today, 06.07.24 w/ f/u in 6 wks -  OCT shows OS: interval improvment in IRF/IRHM  superior fovea and macula at 6 weeks - pt wishes to proceed with injection   - RBA of procedure discussed, questions answered - IVA informed consent obtained and signed, 05.17.23 (OS) - IVE informed consent obtained and signed, 12.08.23 (OS) - see procedure note - F/U 6 weeks -- DFE/OCT/possible injection   2,3. Hypertensive retinopathy OU - discussed importance of tight BP control - monitor  4,5. Mild nonproliferative diabetic retinopathy w/o DME OD - The incidence, risk factors for progression, natural history and treatment options for diabetic retinopathy were discussed with patient.   - The need for close monitoring of blood glucose, blood pressure, and serum lipids, avoiding cigarette or any type of tobacco, and the need for long term follow up was also discussed with patient. - exam shows rare MA OU  - FA (05.17.23) shows rare MA OU; no NV OU -- BRVO OS as above - OCT OD: NFP; Focal cystic changes and IRHM in temporal macula  -- persistent/slightly improved - BCVA remains 20/20 OD - will continue to hold off on anti-VEGF therapy OD for now  - monitor  6. Mixed Cataract OU - The symptoms of cataract, surgical options, and treatments and risks were discussed with patient. - discussed diagnosis and progression - monitor   Ophthalmic Meds Ordered this visit:  No orders of the defined types were placed in this encounter.    No follow-ups on file.  There are no Patient Instructions on file for this visit.   This document serves as a record of services personally performed by Karie Chimera, MD, PhD. It was created on their behalf by Berlin Hun COT, an ophthalmic technician. The creation of this record is the provider's dictation and/or activities during the visit.    Electronically signed by: Berlin Hun COT 06.05.202410:16 AM  This document serves as a record of services personally performed by Karie Chimera, MD, PhD. It was created on their behalf by Gerilyn Nestle, COT an ophthalmic technician. The creation of this record is the provider's dictation and/or activities during the visit.    Electronically signed by:  Gerilyn Nestle, COT  6.7.24 10:18 AM   Abbreviations: M myopia (nearsighted); A astigmatism; H hyperopia (farsighted); P presbyopia; Mrx spectacle prescription;  CTL contact lenses; OD right eye; OS left eye; OU both eyes  XT exotropia; ET esotropia; PEK punctate epithelial keratitis; PEE punctate epithelial erosions; DES dry eye syndrome; MGD meibomian gland dysfunction; ATs artificial tears; PFAT's preservative free artificial tears; NSC nuclear sclerotic cataract; PSC posterior subcapsular cataract; ERM epi-retinal membrane; PVD posterior vitreous detachment; RD retinal detachment; DM diabetes mellitus; DR diabetic retinopathy; NPDR non-proliferative diabetic retinopathy; PDR proliferative diabetic  retinopathy; CSME clinically significant macular edema; DME diabetic  macular edema; dbh dot blot hemorrhages; CWS cotton wool spot; POAG primary open angle glaucoma; C/D cup-to-disc ratio; HVF humphrey visual field; GVF goldmann visual field; OCT optical coherence tomography; IOP intraocular pressure; BRVO Branch retinal vein occlusion; CRVO central retinal vein occlusion; CRAO central retinal artery occlusion; BRAO branch retinal artery occlusion; RT retinal tear; SB scleral buckle; PPV pars plana vitrectomy; VH Vitreous hemorrhage; PRP panretinal laser photocoagulation; IVK intravitreal kenalog; VMT vitreomacular traction; MH Macular hole;  NVD neovascularization of the disc; NVE neovascularization elsewhere; AREDS age related eye disease study; ARMD age related macular degeneration; POAG primary open angle glaucoma; EBMD epithelial/anterior basement membrane dystrophy; ACIOL anterior chamber intraocular lens; IOL intraocular lens; PCIOL posterior chamber intraocular lens; Phaco/IOL phacoemulsification with intraocular lens placement; PRK photorefractive keratectomy; LASIK laser assisted in situ keratomileusis; HTN hypertension; DM diabetes mellitus; COPD chronic obstructive pulmonary disease

## 2023-05-02 ENCOUNTER — Ambulatory Visit (INDEPENDENT_AMBULATORY_CARE_PROVIDER_SITE_OTHER): Payer: 59 | Admitting: Ophthalmology

## 2023-05-02 ENCOUNTER — Encounter (INDEPENDENT_AMBULATORY_CARE_PROVIDER_SITE_OTHER): Payer: Self-pay | Admitting: Ophthalmology

## 2023-05-02 DIAGNOSIS — H35033 Hypertensive retinopathy, bilateral: Secondary | ICD-10-CM

## 2023-05-02 DIAGNOSIS — H34832 Tributary (branch) retinal vein occlusion, left eye, with macular edema: Secondary | ICD-10-CM

## 2023-05-02 DIAGNOSIS — I10 Essential (primary) hypertension: Secondary | ICD-10-CM

## 2023-05-02 DIAGNOSIS — H25813 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-related cataract, bilateral: Secondary | ICD-10-CM

## 2023-05-02 DIAGNOSIS — E113293 Type 2 diabetes mellitus with mild nonproliferative diabetic retinopathy without macular edema, bilateral: Secondary | ICD-10-CM

## 2023-05-02 DIAGNOSIS — Z7984 Long term (current) use of oral hypoglycemic drugs: Secondary | ICD-10-CM

## 2023-05-02 MED ORDER — AFLIBERCEPT 2MG/0.05ML IZ SOLN FOR KALEIDOSCOPE
2.0000 mg | INTRAVITREAL | Status: AC | PRN
  Administered 2023-05-02: 2 mg via INTRAVITREAL

## 2023-05-16 ENCOUNTER — Ambulatory Visit (HOSPITAL_COMMUNITY)
Admission: RE | Admit: 2023-05-16 | Discharge: 2023-05-16 | Disposition: A | Discharge status: Home / Self Care | Payer: 59 | Source: Ambulatory Visit | Attending: Family Medicine | Admitting: Family Medicine

## 2023-05-16 ENCOUNTER — Encounter (HOSPITAL_COMMUNITY): Payer: Self-pay

## 2023-05-16 ENCOUNTER — Other Ambulatory Visit (HOSPITAL_COMMUNITY): Payer: Self-pay

## 2023-05-16 VITALS — BP 140/90 | HR 71 | Wt 174.4 lb

## 2023-05-16 DIAGNOSIS — R0683 Snoring: Secondary | ICD-10-CM | POA: Diagnosis not present

## 2023-05-16 DIAGNOSIS — N183 Chronic kidney disease, stage 3 unspecified: Secondary | ICD-10-CM | POA: Diagnosis not present

## 2023-05-16 DIAGNOSIS — I428 Other cardiomyopathies: Secondary | ICD-10-CM | POA: Diagnosis not present

## 2023-05-16 DIAGNOSIS — I5022 Chronic systolic (congestive) heart failure: Secondary | ICD-10-CM | POA: Diagnosis not present

## 2023-05-16 DIAGNOSIS — E875 Hyperkalemia: Secondary | ICD-10-CM | POA: Insufficient documentation

## 2023-05-16 DIAGNOSIS — I130 Hypertensive heart and chronic kidney disease with heart failure and stage 1 through stage 4 chronic kidney disease, or unspecified chronic kidney disease: Secondary | ICD-10-CM | POA: Insufficient documentation

## 2023-05-16 DIAGNOSIS — I10 Essential (primary) hypertension: Secondary | ICD-10-CM | POA: Diagnosis not present

## 2023-05-16 DIAGNOSIS — R001 Bradycardia, unspecified: Secondary | ICD-10-CM | POA: Insufficient documentation

## 2023-05-16 DIAGNOSIS — I447 Left bundle-branch block, unspecified: Secondary | ICD-10-CM | POA: Insufficient documentation

## 2023-05-16 DIAGNOSIS — Z79899 Other long term (current) drug therapy: Secondary | ICD-10-CM | POA: Diagnosis not present

## 2023-05-16 DIAGNOSIS — I4719 Other supraventricular tachycardia: Secondary | ICD-10-CM | POA: Diagnosis not present

## 2023-05-16 DIAGNOSIS — I4589 Other specified conduction disorders: Secondary | ICD-10-CM | POA: Diagnosis not present

## 2023-05-16 DIAGNOSIS — Z7984 Long term (current) use of oral hypoglycemic drugs: Secondary | ICD-10-CM | POA: Diagnosis not present

## 2023-05-16 LAB — BASIC METABOLIC PANEL
Anion gap: 8 (ref 5–15)
BUN: 16 mg/dL (ref 8–23)
CO2: 25 mmol/L (ref 22–32)
Calcium: 9 mg/dL (ref 8.9–10.3)
Chloride: 103 mmol/L (ref 98–111)
Creatinine, Ser: 0.97 mg/dL (ref 0.44–1.00)
GFR, Estimated: >60 mL/min (ref >60)
Glucose, Bld: 111 mg/dL — ABNORMAL HIGH (ref 70–99)
Potassium: 4 mmol/L (ref 3.5–5.1)
Sodium: 136 mmol/L (ref 135–145)

## 2023-05-16 MED ORDER — ENTRESTO 24-26 MG PO TABS: 1.0000 | ORAL_TABLET | Freq: Two times a day (BID) | ORAL | 3 refills | Status: DC

## 2023-05-16 MED ORDER — ENTRESTO 24-26 MG PO TABS: 1.0000 | ORAL_TABLET | Freq: Two times a day (BID) | ORAL | 6 refills | Status: DC

## 2023-05-16 NOTE — Progress Notes (Signed)
PCP: [name], MD Primary Cardiology: Dr. Wyline Mood HF Cardiology: Dr Shirlee Latch  62 y.o. with history of HTN, SVT, and mild nonischemic cardiomopathy.  In 5/21, echo showed EF 45% with severe anteroseptal hypokinesis.  LHC in 5/21 showed normal coronaries.  She has LBBB at baseline.  She has been noted to have SVT in the past.    On 02/11/21, she went to the ER at [facility] with abdominal pain and distention.  She was noted to be in atrial tachycardia with rate around 150.  She was hypotensive and got IVF, was also transiently on norepinephrine.  HR dropped transiently to the 40s with junctional bradycardia per report. AKI with creatinine up to 2.21.  Echo in 3/22 showed EF 25-30%, moderate RV enlargement and moderately decreased RV systolic function with D-shaped septum, biatrial enlargement.  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  No bradycardia noted at [facility], and she remained in NSR on amiodarone.  EP thought that ablation of the atrial tachycardia would be difficult and recommended ongoing management with amiodarone.  LHC/RHC after diuresis showed normal filling pressures, preserved cardiac output, and no significant CAD.  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   Echo in 9/22 showed EF up to 55-60% with mild LVH, normal RV, and PASP 21 mmHg.   No longer on spironolactone due to hyperkalemia.   Patient was seen by EP, amiodarone was stopped and she was started on dronedarone for atrial tachycardia.  Ablation was considered, but ultimately it was recommended to continue with dronedarone.   No longer on spironolactone due to hyperkalemia.   Follow up 7/23, NYHA I and volume stable. Beta blocker stopped with mild bradycardia. Echo 5/24 EF 40-45%  Today she returns for HF follow up. Overall feeling fine. She has SOB walking up steps but otherwise no dyspnea with ADLs or walking on flat ground.  Denies palpitations, CP, dizziness, edema, or PND/Orthopnea. Appetite ok. No fever or chills. Weight at home 150 pounds. She has been off Entresto, she is unclear when or why this was stopped. She requires assistance with transportation. Lives with her daughter. She is wearing 7 day Zio, per EP. She snores.  ECG (personally reviewed): SR with PACs 76 bpm  Labs (3/22): K 4.6, creatinine 1.7 Labs (4/22): ACE level normal, LFTs normal Labs (5/22): K 4.5, creatinine 0.85 Labs (8/22): K 4.5, creatinine 1.67 Labs (3/23): K 5.6, creatinine 1.35  Labs (2/24): K 3.7, creatinine 1.01  Past Medical History: 1. Atrial tachycardia 2. HTN 3. Nonischemic cardiomyopathy: Echo in 5/21 with EF 45%, severe anteroseptal HK.  - LHC (5/21): normal coronaries - Echo (3/22): EF 25-30%, moderate RV enlargement and moderately decreased RV systolic function with D-shaped septum, biatrial enlargement.  - RHC/LHC (3/22): No significant coronary disease; mean RA 1, PA 35/7, mean PCWP 4, CI 3.5 F, 2.8 T. - Cardiac MRI (3/22): Normal LV size with EF 20%, diffuse hypokinesis with septal-lateral dyssynchrony c/w LBBB. Normal RV size with EF 26%.  Difficult delayed enhancement imaging, however there does appear to be LGE in the septum that could be in a coronary disease-type pattern (<50% wall thickness subendocardial LGE in the mid-apical septal wall). - High resolution CT chest (4/22) with no evidence for pulmonary sarcoidosis or ILD.  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  - Echo (9/22): EF 55-60% with mild LVH, normal RV, and PASP 21 mmHg - Echo (  5/24): EF 45%, grade III DD (restrictive), normal RV 4. Thyroid nodules: Benign on 2022 biopsy.   FH: Mother and niece with CHF.   Social History   Socioeconomic History   Marital status: Single    Spouse name: Not on file   Number of children: Not on file   Years of education: Not on file   Highest education level: Not on  file  Occupational History   Not on file  Tobacco Use   Smoking status: Never   Smokeless tobacco: Never  Vaping Use   Vaping Use: Never used  Substance and Sexual Activity   Alcohol use: No    Alcohol/week: 0.0 standard drinks of alcohol   Drug use: No   Sexual activity: Not Currently    Birth control/protection: Post-menopausal  Other Topics Concern   Not on file  Social History Narrative   Not on file   Social Determinants of Health   Financial Resource Strain: Low Risk  (02/27/2021)   Overall Financial Resource Strain (CARDIA)    Difficulty of Paying Living Expenses: Not hard at all  Recent Concern: Financial Resource Strain - Medium Risk (02/19/2021)   Overall Financial Resource Strain (CARDIA)    Difficulty of Paying Living Expenses: Somewhat hard  Food Insecurity: No Food Insecurity (02/27/2021)   Hunger Vital Sign    Worried About Running Out of Food in the Last Year: Never true    Ran Out of Food in the Last Year: Never true  Transportation Needs: No Transportation Needs (02/27/2021)   PRAPARE - Administrator, Civil Service (Medical): No    Lack of Transportation (Non-Medical): No  Physical Activity: Sufficiently Active (02/27/2021)   Exercise Vital Sign    Days of Exercise per Week: 7 days    Minutes of Exercise per Session: 30 min  Stress: No Stress Concern Present (02/27/2021)   [employer] of Occupational Health - Occupational Stress Questionnaire    Feeling of Stress : Not at all  Social Connections: Unknown (02/27/2021)   Social Connection and Isolation Panel [NHANES]    Frequency of Communication with Friends and Family: More than three times a week    Frequency of Social Gatherings with Friends and Family: Once a week    Attends Religious Services: 1 to 4 times per year    Active Member of [employer] or Organizations: No    Attends Banker Meetings: Never    Marital Status: Patient declined  Catering manager Violence: Not At Risk  (02/27/2021)   Humiliation, Afraid, Rape, and Kick questionnaire    Fear of Current or Ex-Partner: No    Emotionally Abused: No    Physically Abused: No    Sexually Abused: No   ROS: All systems reviewed and negative except as per HPI.   Current Outpatient Medications  Medication Sig Dispense Refill   albuterol (VENTOLIN HFA) 108 (90 Base) MCG/ACT inhaler Inhale 1-2 puffs into the lungs every 4 (four) hours as needed.     amLODipine (NORVASC) 5 MG tablet Take 2 tablets (10 mg total) by mouth daily. 90 tablet 3   atorvastatin (LIPITOR) 80 MG tablet Take 1 tablet (80 mg total) by mouth daily. 90 tablet 3   dapagliflozin propanediol (FARXIGA) 10 MG TABS tablet TAKE 1 TABLET (10 MG TOTAL) BY MOUTH DAILY. 30 tablet 6   diclofenac Sodium (VOLTAREN) 1 % GEL Apply topically.     dronedarone (MULTAQ) 400 MG tablet Take 1 tablet (400 mg total) by mouth 2 (  two) times daily with a meal. 180 tablet 3   hydrALAZINE (APRESOLINE) 50 MG tablet Take 1.5 tablets (75 mg total) by mouth 3 (three) times daily. 405 tablet 3   isosorbide mononitrate (IMDUR) 30 MG 24 hr tablet Take 1 tablet (30 mg total) by mouth daily. 90 tablet 3   TRADJENTA 5 MG TABS tablet Take 5 mg by mouth daily.     sacubitril-valsartan (ENTRESTO) 97-103 MG Take 1 tablet by mouth 2 (two) times daily. (Patient not taking: Reported on 05/16/2023) 60 tablet 11   No current facility-administered medications for this encounter.   BP (!) 140/90   Pulse 71   Wt 79.1 kg (174 lb 6.4 oz)   LMP 05/23/2007   SpO2 98%   BMI 27.31 kg/m   Wt Readings from Last 3 Encounters:  05/16/23 79.1 kg (174 lb 6.4 oz)  04/25/23 78.9 kg (174 lb)  01/09/23 78.8 kg (173 lb 12.8 oz)   Physical Exam General:  NAD. No resp difficulty, walked into clinic HEENT: Normal Neck: Supple. No JVD. Carotids 2+ bilat; no bruits. No lymphadenopathy or thryomegaly appreciated. Cor: PMI nondisplaced. Regular rate & rhythm. No rubs, gallops or murmurs. Lungs: Clear Abdomen:  Soft, nontender, nondistended. No hepatosplenomegaly. No bruits or masses. Good bowel sounds. Extremities: No cyanosis, clubbing, rash, edema Neuro: Alert & oriented x 3, cranial nerves grossly intact. Moves all 4 extremities w/o difficulty. Affect pleasant.  Assessment/Plan: 1. Chronic systolic CHF:  Nonischemic cardiomyopathy based on 5/21 cath, echo at that time showed EF 45%.  She was admitted 3/22 with volume overload and atrial tachycardia with HR in 150s. Back in NSR with amiodarone. Echo in 3/22 with EF 25-30%, moderate RV enlargement and moderately decreased RV systolic function with D-shaped septum, biatrial enlargement.  CMRI with LV EF 20% RV EF 26%, technically difficult but there was delayed enhancement suggestive of possible coronary disease-type pattern, however LHC repeated in 3/22 showed no CAD. RHC showed low filling pressures and preserved CO.  Cause of cardiomyopathy uncertain, cannot rule out cardiac sarcoidosis as cause of MRI pattern though high resolution CT chest did not show evidence for pulmonary sarcoidosis.  Tachy-mediated component is certainly possible as well (from atrial tachycardia).  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  Echo (5/24) showed EF 45%. NYHA I-II today, she is not volume overloaded today.  - She has not needed Lasix.  - Stop amlodpine. - Restart Entresto 24/26 mg bid. BMET today, repeat  in 10-14 days. (OK to get labs at [facility]) - Continue Farxiga 10 mg daily.   - Continue hydralazine 75 mg tid + Imdur 30 mg daily.  - No longer on spironolactone due to hyperkalemia.   - Off bb with mild bradycardia.   - She is now out of ICD range.   - With improvement in EF, think we can hold off on cardiac PET (? cardiac sarcoidosis).  - EF mildly lower  than previous, no CP or increased dyspnea. ? Medication compliance vs recurrence of AT.  Repeat Echo next visit to ensure EF stable. 2. Atrial tachycardia with RVR: 3/22 admission.  Suspect that this contributed to cardiomyopathy.  Denies palpitations, NSR on ECG today. - No longer on amiodarone. Continue dronedarone for maintenance of NSR.   - Dr. Elberta Fortis recommended  against ablation for now (continue dronedarone) unless recurrence.  - She is currently wearing Zio 7 day, per EP, with evidence of AV dissociation with PACs and low atrial rhythm. 3. CKD: Stage 3. BMET  4. HTN: BP elevated. - Med changes as above. 5. Suspect OSA: Unable to complete home sleep study. Set up sleep study at [facility]. She lives in [place], [place].  6. H/o Hyperkalemia: No longer on spironolactone of K supplements. Follow labs closely with addition of Entresto.  Follow up in 4 months with Dr. Shirlee Latch + echo. She follows with [facility] at [place].  Anderson Malta [facility] FNP-BC  05/16/2023

## 2023-05-16 NOTE — Patient Instructions (Addendum)
Thank you for coming in today  If you had labs drawn today, any labs that are abnormal the clinic will call you No news is good news  You have been order for sleep study [facility] will call you to make appointment   Medications: STOP Amlodipine RESTART Entresto 24/26 mg 1 tablet twice daily     Follow up appointments: Your physician recommends that you return for lab work in: please go to [facility] for labs in 10 days  Your physician recommends that you schedule a follow-up appointment in:  4 months With Dr. Shirlee Latch with echocardiogram  Your physician has requested that you have an echocardiogram. Echocardiography is a painless test that uses sound waves to create images of your heart. It provides your doctor with information about the size and shape of your heart and how well your heart's chambers and valves are working. This procedure takes approximately one hour. There are no restrictions for this procedure.     Do the following things EVERYDAY: Weigh yourself in the morning before breakfast. Write it down and keep it in a log. Take your medicines as prescribed Eat low salt foods--Limit salt (sodium) to 2000 mg per day.  Stay as active as you can everyday Limit all fluids for the day to less than 2 liters   At the [facility], you and your health needs are our priority. As part of our continuing mission to provide you with exceptional heart care, we have created designated Provider Care Teams. These Care Teams include your primary Cardiologist (physician) and Advanced Practice Providers (APPs- Physician Assistants and Nurse Practitioners) who all work together to provide you with the care you need, when you need it.   You may see any of the following providers on your designated Care Team at your next follow up: Dr Arvilla Meres Dr Marca Ancona Dr. Marcos Eke, NP Robbie Lis, [place] [facility] [place],  [place] Brynda Peon, NP Karle Plumber, PharmD   Please be sure to bring in all your medications bottles to every appointment.    Thank you for choosing [facility]  If you have any questions or concerns before your next appointment please send [place] a message through [place] or call our office at [phone].    TO LEAVE A MESSAGE FOR THE NURSE SELECT OPTION 2, PLEASE LEAVE A MESSAGE INCLUDING: YOUR NAME DATE OF BIRTH CALL BACK NUMBER REASON FOR CALL**this is important as we prioritize the call backs  YOU WILL RECEIVE A CALL BACK THE SAME DAY AS LONG AS YOU CALL BEFORE 4:00 PM

## 2023-05-20 DIAGNOSIS — I10 Essential (primary) hypertension: Secondary | ICD-10-CM | POA: Diagnosis not present

## 2023-05-20 DIAGNOSIS — I5042 Chronic combined systolic (congestive) and diastolic (congestive) heart failure: Secondary | ICD-10-CM | POA: Diagnosis not present

## 2023-05-21 DIAGNOSIS — R002 Palpitations: Secondary | ICD-10-CM | POA: Diagnosis not present

## 2023-05-21 DIAGNOSIS — I4589 Other specified conduction disorders: Secondary | ICD-10-CM | POA: Diagnosis not present

## 2023-06-13 ENCOUNTER — Encounter (INDEPENDENT_AMBULATORY_CARE_PROVIDER_SITE_OTHER): Payer: 59 | Admitting: Ophthalmology

## 2023-06-13 DIAGNOSIS — I10 Essential (primary) hypertension: Secondary | ICD-10-CM

## 2023-06-13 DIAGNOSIS — Z7984 Long term (current) use of oral hypoglycemic drugs: Secondary | ICD-10-CM

## 2023-06-13 DIAGNOSIS — E113293 Type 2 diabetes mellitus with mild nonproliferative diabetic retinopathy without macular edema, bilateral: Secondary | ICD-10-CM

## 2023-06-13 DIAGNOSIS — H34832 Tributary (branch) retinal vein occlusion, left eye, with macular edema: Secondary | ICD-10-CM

## 2023-06-13 DIAGNOSIS — H35033 Hypertensive retinopathy, bilateral: Secondary | ICD-10-CM

## 2023-06-13 DIAGNOSIS — H25813 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-related cataract, bilateral: Secondary | ICD-10-CM

## 2023-06-19 ENCOUNTER — Encounter (INDEPENDENT_AMBULATORY_CARE_PROVIDER_SITE_OTHER): Payer: 59 | Admitting: Ophthalmology

## 2023-06-19 DIAGNOSIS — I5042 Chronic combined systolic (congestive) and diastolic (congestive) heart failure: Secondary | ICD-10-CM | POA: Diagnosis not present

## 2023-06-19 DIAGNOSIS — I10 Essential (primary) hypertension: Secondary | ICD-10-CM | POA: Diagnosis not present

## 2023-06-20 NOTE — Progress Notes (Signed)
[facility] - Clinic Note  06/23/2023    CHIEF COMPLAINT Patient presents for Retina Follow Up   HISTORY OF PRESENT ILLNESS: Michelle Morrison is a 62 y.o. female who presents to the clinic today for:   HPI     Retina Follow Up   Patient presents with  CRVO/BRVO.  In left eye.  This started 6 weeks ago.  Duration of 6 weeks.  Since onset it is stable.  I, the attending physician,  performed the HPI with the patient and updated documentation appropriately.        Comments   6 week retina follow up BRVO and CME I'VE OS pt is reporting no vision changes noticed she denies any flashes or floaters       Last edited by [name], MD on 06/23/2023 12:23 PM.    Patient delayed (7 wks instead of 6) due to transportation and her father passed away.  She feels that her vision is the same.  Referring physician: [name], MD [address] [place],  [place] [address]  HISTORICAL INFORMATION:   Selected notes from the MEDICAL RECORD NUMBER Referred by Dr. [name]: 05.08.23 Ocular Hx- NPDR OS  Patient saw Dr. [name] Feb 2022.  Vision at that time was OD 20/20, OS 20/25    CURRENT MEDICATIONS: No current outpatient medications on file. (Ophthalmic Drugs)   No current facility-administered medications for this visit. (Ophthalmic Drugs)   Current Outpatient Medications (Other)  Medication Sig   albuterol (VENTOLIN HFA) 108 (90 Base) MCG/ACT inhaler Inhale 1-2 puffs into the lungs every 4 (four) hours as needed.   atorvastatin (LIPITOR) 80 MG tablet Take 1 tablet (80 mg total) by mouth daily.   dapagliflozin propanediol (FARXIGA) 10 MG TABS tablet TAKE 1 TABLET (10 MG TOTAL) BY MOUTH DAILY.   diclofenac Sodium (VOLTAREN) 1 % GEL Apply topically.   dronedarone (MULTAQ) 400 MG tablet Take 1 tablet (400 mg total) by mouth 2 (two) times daily with a meal.   hydrALAZINE (APRESOLINE) 50 MG tablet Take 1.5 tablets (75 mg total) by mouth 3 (three)  times daily.   isosorbide mononitrate (IMDUR) 30 MG 24 hr tablet Take 1 tablet (30 mg total) by mouth daily.   sacubitril-valsartan (ENTRESTO) 24-26 MG Take 1 tablet by mouth 2 (two) times daily.   TRADJENTA 5 MG TABS tablet Take 5 mg by mouth daily.   No current facility-administered medications for this visit. (Other)   REVIEW OF SYSTEMS: ROS   Positive for: Endocrine, Cardiovascular, Eyes, Respiratory Negative for: Constitutional, Gastrointestinal, Neurological, Skin, Genitourinary, Musculoskeletal, HENT, Psychiatric, Allergic/Imm, Heme/Lymph Last edited by Etheleen Mayhew, COT on 06/23/2023  8:58 AM.      ALLERGIES No Known Allergies  PAST MEDICAL HISTORY Past Medical History:  Diagnosis Date   CHF (congestive heart failure) (HCC)    a. EF 45% by echo in 03/2020 with normal cors by cath   Diabetes mellitus without complication (HCC)    Diarrhea    Heart disease    Herpes simplex infection    Hyperlipidemia    Hypertension    Past Surgical History:  Procedure Laterality Date   CESAREAN SECTION     CHOLECYSTECTOMY     COLONOSCOPY N/A 08/26/2014   Procedure: COLONOSCOPY;  Surgeon: West Bali, MD;  Location: [facility];  Service: Endoscopy;  Laterality: N/A;  9:30 AM - moved to 8:30 - Ginger to notify pt   LEFT HEART CATH AND CORONARY  ANGIOGRAPHY N/A 04/14/2020   Procedure: LEFT HEART CATH AND CORONARY ANGIOGRAPHY;  Surgeon: [place], [name], MD;  [facility]: [facility] INVASIVE CV LAB;  Service: Cardiovascular;  Laterality: N/A;   RIGHT/LEFT HEART CATH AND CORONARY ANGIOGRAPHY N/A 02/19/2021   Procedure: RIGHT/LEFT HEART CATH AND CORONARY ANGIOGRAPHY;  Surgeon: Laurey Morale, MD;  Location: [facility] INVASIVE CV LAB;  Service: Cardiovascular;  Laterality: N/A;   FAMILY HISTORY Family History  Problem Relation Age of Onset   Hypertension Mother    Diabetes Mother    Colon cancer Neg Hx    SOCIAL HISTORY Social History   Tobacco Use   Smoking status: Never   Smokeless  tobacco: Never  Vaping Use   Vaping status: Never Used  Substance Use Topics   Alcohol use: No    Alcohol/week: 0.0 standard drinks of alcohol   Drug use: No       OPHTHALMIC EXAM:  Base Eye Exam     Visual Acuity (Snellen - Linear)       Right Left   Dist [place] 20/25 -2 20/30 -1   Dist ph [place] NI NI         Tonometry (Tonopen, 9:01 AM)       Right Left   Pressure 18 18         Pupils       Pupils Dark Light Shape React APD   Right PERRL 2 1 Round Brisk None   Left PERRL 2 1 Round Brisk None         Visual Fields       Left Right    Full Full         Extraocular Movement       Right Left    Full, Ortho Full, Ortho         Neuro/Psych     Oriented x3: Yes   Mood/Affect: Normal         Dilation     Both eyes: 2.5% Phenylephrine @ 9:01 AM           Slit Lamp and Fundus Exam     Slit Lamp Exam       Right Left   Lids/Lashes Dermatochalasis - upper lid, Meibomian gland dysfunction Dermatochalasis - upper lid, Meibomian gland dysfunction   Conjunctiva/Sclera melanosis melanosis   Cornea trace PEE trace PEE   Anterior Chamber deep, narrow temporal angle deep, narrow temporal angle   Iris Round and dilated, No NVI Round and dilated, No NVI   Lens 2+ Nuclear sclerosis, 2+ Cortical cataract 2+ Nuclear sclerosis, 2+ Cortical cataract   Anterior Vitreous Vitreous syneresis Vitreous syneresis         Fundus Exam       Right Left   Disc Pink and sharp, PPP Pink and sharp, PPP   C/D Ratio 0.6 0.6   Macula Flat, Good foveal reflex, trace cystic changes temporal to fovea-- slightly improved Blunted foveal reflex, central edema -- slightly increased, perifoveal exudates, cluster of IRH superior macula   Vessels Attenuated, Tortuous Attenuated, Tortuous, Copper wiring, 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 Central Foveal Thickness: 228. Progression has improved. Findings include normal foveal contour, no SRF, intraretinal hyper-reflective material, vitreomacular adhesion (Focal  cystic changes and IRHM IT macula -- persistent/slightly improved).   Left Eye Quality was good. Central Foveal Thickness: 319. Progression has worsened. Findings include no SRF, abnormal foveal contour, subretinal hyper-reflective material, intraretinal hyper-reflective material, intraretinal fluid, vitreomacular adhesion (Mild interval increase in IRF/IRHM superior fovea and macula ).   Notes *Images captured and stored on drive  Diagnosis / Impression:  OD: NFP; Focal cystic changes and IRHM IT macula -- persistent/slightly improved OS: BRVO w/ CME - Mild interval increase in IRF/IRHM superior fovea and macula   Clinical management:  See below  Abbreviations: NFP - Normal foveal profile. CME - cystoid macular edema. PED - pigment epithelial detachment. IRF - intraretinal fluid. SRF - subretinal fluid. EZ - ellipsoid zone. ERM - epiretinal membrane. ORA - outer retinal atrophy. ORT - outer retinal tubulation. SRHM - subretinal hyper-reflective material. IRHM - intraretinal hyper-reflective material      Intravitreal Injection, Pharmacologic Agent - OS - Left Eye       Time Out 06/23/2023. 9:23 AM. Confirmed correct patient, procedure, site, and patient consented.   Anesthesia Topical anesthesia was used. Anesthetic medications included Lidocaine 2%, Proparacaine 0.5%.   Procedure Preparation included 5% betadine to ocular surface, eyelid speculum. A (32g) needle was used.   Injection: 2 mg aflibercept 2 MG/0.05ML   Route: Intravitreal, Site: Left Eye   NDC: L6038910, Lot: [id number], Expiration date: 07/25/2024, Waste: 0 mL   Post-op Post injection exam found visual acuity of at least counting fingers. The patient tolerated the procedure well.  There were no complications. The patient received written and verbal post procedure care education. Post injection medications were not given.             ASSESSMENT/PLAN:    ICD-10-CM   1. Branch retinal vein occlusion of left eye with macular edema  H34.8320 OCT, Retina - OU - Both Eyes    Intravitreal Injection, Pharmacologic Agent - OS - Left Eye    aflibercept (EYLEA) SOLN 2 mg    2. Essential hypertension  I10     3. Hypertensive retinopathy of both eyes  H35.033     4. Mild nonproliferative diabetic retinopathy of both eyes without macular edema associated with type 2 diabetes mellitus (HCC)  U98.1191     5. Long term (current) use of oral hypoglycemic drugs  Z79.84     6. 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-related cataract of both eyes  H25.813      BRVO w/ CME OS  - delayed f/u -- 7 wks instead of 6 due to death in family - by history, likely onset around Dec 2022, but delayed presentation to May 2023 - FA 5.17.23 shows: Focal cluster of leakage superior mac and perifovea, ?macroaneurysm - s/p IVA OS #1 (05.17.23), #2 (06.14.23), #3 (08.02.23), #4 (09.06.23), #5 (10.04.23), #6 (11.06.23) -- IVA resistance ========================================================== - s/p IVE OS #1 (12.08.23), #2 (01.17.24), #3 (03.14.24), #4 (04.25.24), #5 (06.07.24) **h/o increased fluid OS at 7 wks -- noted on 07.29.24 visit** - BCVA OS 20/30 -- stable - OCT shows OS: Mild interval increase in IRF/IRHM superior fovea and macula at 7 weeks - recommend IVE OS #6 today, 07.29.24 w/ f/u back to 6 wks - pt wishes to proceed with injection   - RBA of procedure discussed, questions answered - IVA informed consent obtained and signed, 05.17.23 (OS) - IVE informed consent obtained and signed, 12.08.23 (OS) - see procedure note - F/U 6 weeks -- DFE/OCT/possible injection   2,3. Hypertensive retinopathy OU - discussed  importance of tight BP control - monitor  4,5. Mild nonproliferative diabetic  retinopathy w/o DME OD - The incidence, risk factors for progression, natural history and treatment options for diabetic retinopathy were discussed with patient.   - The need for close monitoring of blood glucose, blood pressure, and serum lipids, avoiding cigarette or any type of tobacco, and the need for long term follow up was also discussed with patient. - exam shows rare MA OU  - FA (05.17.23) shows rare MA OU; no NV OU -- BRVO OS as above - OCT OD: NFP; Focal cystic changes and IRHM in temporal macula -- persistent/slightly improved - BCVA OD 20/25  - will continue to hold off on anti-VEGF therapy OD for now  - monitor  6. Mixed Cataract OU - The symptoms of cataract, surgical options, and treatments and risks were discussed with patient. - discussed diagnosis and progression - monitor  Ophthalmic Meds Ordered this visit:  Meds ordered this encounter  Medications   aflibercept (EYLEA) SOLN 2 mg     Return in about 6 weeks (around 08/04/2023) for f/u BRVO OS , DFE, OCT, Possible, IVE, OS.  There are no Patient Instructions on file for this visit.   This document serves as a record of services personally performed by Karie Chimera, MD, PhD. It was created on their behalf by Annalee Genta, COMT. The creation of this record is the provider's dictation and/or activities during the visit.  Electronically signed by: Annalee Genta, COMT 06/23/23 12:23 PM  This document serves as a record of services personally performed by Karie Chimera, MD, PhD. It was created on their behalf by Gerilyn Nestle, COT an ophthalmic technician. The creation of this record is the provider's dictation and/or activities during the visit.    Electronically signed by:  Charlette Caffey, COT  06/23/23 12:23 PM   Karie Chimera, M.D., Ph.D. Diseases & Surgery of the Retina and Vitreous [facility]  I have reviewed the above documentation for accuracy and completeness, and I  agree with the above. Karie Chimera, M.D., Ph.D. 06/23/23 12:26 PM   Abbreviations: M myopia (nearsighted); A astigmatism; H hyperopia (farsighted); P presbyopia; Mrx spectacle prescription;  CTL contact lenses; OD right eye; OS left eye; OU both eyes  XT exotropia; ET esotropia; PEK punctate epithelial keratitis; PEE punctate epithelial erosions; DES dry eye syndrome; MGD meibomian gland dysfunction; ATs artificial tears; PFAT's preservative free artificial tears; NSC nuclear sclerotic cataract; PSC posterior subcapsular cataract; ERM epi-retinal membrane; PVD posterior vitreous detachment; RD retinal detachment; DM diabetes mellitus; DR diabetic retinopathy; NPDR non-proliferative diabetic retinopathy; PDR proliferative diabetic retinopathy; CSME clinically significant macular edema; DME diabetic macular edema; dbh dot blot hemorrhages; CWS cotton wool spot; POAG primary open angle glaucoma; C/D cup-to-disc ratio; HVF humphrey visual field; GVF goldmann visual field; OCT optical coherence tomography; IOP intraocular pressure; BRVO Branch retinal vein occlusion; CRVO central retinal vein occlusion; CRAO central retinal artery occlusion; BRAO branch retinal artery occlusion; RT retinal tear; SB scleral buckle; PPV pars plana vitrectomy; VH Vitreous hemorrhage; PRP panretinal laser photocoagulation; IVK intravitreal kenalog; VMT vitreomacular traction; MH Macular hole;  NVD neovascularization of the disc; NVE neovascularization elsewhere; AREDS age related eye disease study; ARMD age related macular degeneration; POAG primary open angle glaucoma; EBMD epithelial/anterior basement membrane dystrophy; ACIOL anterior chamber intraocular lens; IOL intraocular lens; PCIOL posterior chamber intraocular lens; Phaco/IOL phacoemulsification with intraocular lens placement; PRK photorefractive keratectomy; LASIK laser assisted in situ keratomileusis;  HTN hypertension; DM diabetes mellitus; COPD chronic obstructive  pulmonary disease

## 2023-06-23 ENCOUNTER — Encounter (INDEPENDENT_AMBULATORY_CARE_PROVIDER_SITE_OTHER): Payer: Self-pay | Admitting: Ophthalmology

## 2023-06-23 ENCOUNTER — Ambulatory Visit (INDEPENDENT_AMBULATORY_CARE_PROVIDER_SITE_OTHER): Payer: 59 | Admitting: Ophthalmology

## 2023-06-23 DIAGNOSIS — I10 Essential (primary) hypertension: Secondary | ICD-10-CM

## 2023-06-23 DIAGNOSIS — H25813 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-related cataract, bilateral: Secondary | ICD-10-CM

## 2023-06-23 DIAGNOSIS — H35033 Hypertensive retinopathy, bilateral: Secondary | ICD-10-CM

## 2023-06-23 DIAGNOSIS — H34832 Tributary (branch) retinal vein occlusion, left eye, with macular edema: Secondary | ICD-10-CM

## 2023-06-23 DIAGNOSIS — Z7984 Long term (current) use of oral hypoglycemic drugs: Secondary | ICD-10-CM | POA: Diagnosis not present

## 2023-06-23 DIAGNOSIS — E113293 Type 2 diabetes mellitus with mild nonproliferative diabetic retinopathy without macular edema, bilateral: Secondary | ICD-10-CM

## 2023-06-23 MED ORDER — AFLIBERCEPT 2MG/0.05ML IZ SOLN FOR KALEIDOSCOPE
2.0000 mg | INTRAVITREAL | Status: AC | PRN
  Administered 2023-06-23: 2 mg via INTRAVITREAL

## 2023-07-24 ENCOUNTER — Ambulatory Visit: Payer: 59 | Admitting: Adult Health

## 2023-07-31 NOTE — Progress Notes (Signed)
[facility] - Clinic Note  08/04/2023    CHIEF COMPLAINT Patient presents for Retina Follow Up   HISTORY OF PRESENT ILLNESS: Michelle Morrison is a 62 y.o. female who presents to the clinic today for:   HPI     Retina Follow Up   Patient presents with  CRVO/BRVO.  In left eye.  This started months ago.  Duration of 6 weeks.  Since onset it is stable.  I, the attending physician,  performed the HPI with the patient and updated documentation appropriately.        Comments   Patient feels the vision is a little better. She is not using eye drops.       Last edited by [name], MD on 08/05/2023 11:35 PM.       Referring physician: [name], MD [address] [place],  [place] [address]  HISTORICAL INFORMATION:   Selected notes from the MEDICAL RECORD NUMBER Referred by Dr. [name]: 05.08.23 Ocular Hx- NPDR OS  Patient saw Dr. [name] Feb 2022.  Vision at that time was OD 20/20, OS 20/25    CURRENT MEDICATIONS: No current outpatient medications on file. (Ophthalmic Drugs)   No current facility-administered medications for this visit. (Ophthalmic Drugs)   Current Outpatient Medications (Other)  Medication Sig   albuterol (VENTOLIN HFA) 108 (90 Base) MCG/ACT inhaler Inhale 1-2 puffs into the lungs every 4 (four) hours as needed.   atorvastatin (LIPITOR) 80 MG tablet Take 1 tablet (80 mg total) by mouth daily.   dapagliflozin propanediol (FARXIGA) 10 MG TABS tablet TAKE 1 TABLET (10 MG TOTAL) BY MOUTH DAILY.   diclofenac Sodium (VOLTAREN) 1 % GEL Apply topically.   dronedarone (MULTAQ) 400 MG tablet Take 1 tablet (400 mg total) by mouth 2 (two) times daily with a meal.   hydrALAZINE (APRESOLINE) 50 MG tablet Take 1.5 tablets (75 mg total) by mouth 3 (three) times daily.   isosorbide mononitrate (IMDUR) 30 MG 24 hr tablet Take 1 tablet (30 mg total) by mouth daily.   sacubitril-valsartan (ENTRESTO) 24-26 MG Take 1 tablet by mouth  2 (two) times daily.   TRADJENTA 5 MG TABS tablet Take 5 mg by mouth daily.   No current facility-administered medications for this visit. (Other)   REVIEW OF SYSTEMS: ROS   Positive for: Endocrine, Cardiovascular, Eyes, Respiratory Negative for: Constitutional, Gastrointestinal, Neurological, Skin, Genitourinary, Musculoskeletal, HENT, Psychiatric, Allergic/Imm, Heme/Lymph Last edited by Charlette Caffey, COT on 08/04/2023  8:02 AM.     ALLERGIES No Known Allergies  PAST MEDICAL HISTORY Past Medical History:  Diagnosis Date   CHF (congestive heart failure) (HCC)    a. EF 45% by echo in 03/2020 with normal cors by cath   Diabetes mellitus without complication (HCC)    Diarrhea    Heart disease    Herpes simplex infection    Hyperlipidemia    Hypertension    Past Surgical History:  Procedure Laterality Date   CESAREAN SECTION     CHOLECYSTECTOMY     COLONOSCOPY N/A 08/26/2014   Procedure: COLONOSCOPY;  Surgeon: West Bali, MD;  Location: [facility];  Service: Endoscopy;  Laterality: N/A;  9:30 AM - moved to 8:30 - Ginger to notify pt   LEFT HEART CATH AND CORONARY ANGIOGRAPHY N/A 04/14/2020   Procedure: LEFT HEART CATH AND CORONARY ANGIOGRAPHY;  Surgeon: Swazil[place], [name], MD;  Location: [facility] INVASIVE CV LAB;  Service: Cardiovascular;  Laterality: N/A;   RIGHT/LEFT  HEART CATH AND CORONARY ANGIOGRAPHY N/A 02/19/2021   Procedure: RIGHT/LEFT HEART CATH AND CORONARY ANGIOGRAPHY;  Surgeon: Laurey Morale, MD;  Location: [facility] INVASIVE CV LAB;  Service: Cardiovascular;  Laterality: N/A;   FAMILY HISTORY Family History  Problem Relation Age of Onset   Hypertension Mother    Diabetes Mother    Colon cancer Neg Hx    SOCIAL HISTORY Social History   Tobacco Use   Smoking status: Never   Smokeless tobacco: Never  Vaping Use   Vaping status: Never Used  Substance Use Topics   Alcohol use: No    Alcohol/week: 0.0 standard drinks of alcohol   Drug use: No       OPHTHALMIC  EXAM:  Base Eye Exam     Visual Acuity (Snellen - Linear)       Right Left   Dist cc 20/20 20/50   Dist ph cc  NI         Tonometry (Tonopen, 8:06 AM)       Right Left   Pressure 19 18         Pupils       Dark Light Shape React APD   Right 2 1 Round Brisk None   Left 2 1 Round Brisk None         Visual Fields       Left Right    Full Full         Extraocular Movement       Right Left    Full, Ortho Full, Ortho         Neuro/Psych     Oriented x3: Yes   Mood/Affect: Normal         Dilation     Both eyes: 1.0% Mydriacyl, 2.5% Phenylephrine @ 8:02 AM           Slit Lamp and Fundus Exam     Slit Lamp Exam       Right Left   Lids/Lashes Dermatochalasis - upper lid, Meibomian gland dysfunction Dermatochalasis - upper lid, Meibomian gland dysfunction   Conjunctiva/Sclera melanosis melanosis   Cornea trace PEE trace PEE   Anterior Chamber deep, narrow temporal angle deep, narrow temporal angle   Iris Round and dilated, No NVI Round and dilated, No NVI   Lens 2+ Nuclear sclerosis, 2+ Cortical cataract 2+ Nuclear sclerosis, 2+ Cortical cataract   Anterior Vitreous Vitreous syneresis Vitreous syneresis         Fundus Exam       Right Left   Disc Pink and sharp, PPP Pink and sharp, PPP   C/D Ratio 0.6 0.6   Macula Flat, Good foveal reflex, trace cystic changes temporal to fovea -- slightly improved Blunted foveal reflex, central edema -- slightly improved, perifoveal exudates, cluster of IRH superior macula -- improving   Vessels Attenuated, Tortuous Attenuated, Tortuous, Copper wiring, 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 Central Foveal Thickness: 224. Progression has improved. Findings include normal foveal contour, no IRF, no SRF,  intraretinal hyper-reflective material, vitreomacular adhesion (Focal cystic changes and IRHM IT macula -- persistent/slightly improved).   Left Eye Quality was good. Central Foveal Thickness: 298. Progression has improved. Findings include no SRF, abnormal foveal contour, subretinal  hyper-reflective material, intraretinal hyper-reflective material, intraretinal fluid, vitreomacular adhesion (Mild interval improvement in IRF/IRHM superior fovea and macula, central ORA and SRHM).   Notes *Images captured and stored on drive  Diagnosis / Impression:  OD: NFP; Focal cystic changes and IRHM IT macula -- persistent/slightly improved OS: BRVO w/ CME - Mild interval improvement in IRF/IRHM superior fovea and macula, central ORA and SRHM  Clinical management:  See below  Abbreviations: NFP - Normal foveal profile. CME - cystoid macular edema. PED - pigment epithelial detachment. IRF - intraretinal fluid. SRF - subretinal fluid. EZ - ellipsoid zone. ERM - epiretinal membrane. ORA - outer retinal atrophy. ORT - outer retinal tubulation. SRHM - subretinal hyper-reflective material. IRHM - intraretinal hyper-reflective material      Intravitreal Injection, Pharmacologic Agent - OS - Left Eye       Time Out 08/04/2023. 8:56 AM. Confirmed correct patient, procedure, site, and patient consented.   Anesthesia Topical anesthesia was used. Anesthetic medications included Lidocaine 2%, Proparacaine 0.5%.   Procedure Preparation included 5% betadine to ocular surface, eyelid speculum. A (32g) needle was used.   Injection: 2 mg aflibercept 2 MG/0.05ML   Route: Intravitreal, Site: Left Eye   NDC: L6038910, Lot: [id number], Expiration date: 10/24/2024, Waste: 0 mL   Post-op Post injection exam found visual acuity of at least counting fingers. The patient tolerated the procedure well. There were no complications. The patient received written and verbal post procedure care education. Post injection  medications were not given.            ASSESSMENT/PLAN:    ICD-10-CM   1. Branch retinal vein occlusion of left eye with macular edema  H34.8320 OCT, Retina - OU - Both Eyes    Intravitreal Injection, Pharmacologic Agent - OS - Left Eye    aflibercept (EYLEA) SOLN 2 mg    2. Essential hypertension  I10     3. Hypertensive retinopathy of both eyes  H35.033     4. Mild nonproliferative diabetic retinopathy of both eyes without macular edema associated with type 2 diabetes mellitus (HCC)  X32.4401     5. Long term (current) use of oral hypoglycemic drugs  Z79.84     6. 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-related cataract of both eyes  H25.813      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, ?macroaneurysm - s/p IVA OS #1 (05.17.23), #2 (06.14.23), #3 (08.02.23), #4 (09.06.23), #5 (10.04.23), #6 (11.06.23) -- IVA resistance ========================================================== - s/p IVE OS #1 (12.08.23), #2 (01.17.24), #3 (03.14.24), #4 (04.25.24), #5 (06.07.24), #6 (07.29.24) **h/o increased fluid OS at 7 wks -- noted on 07.29.24 visit** - BCVA OS 20/50 -- decreased from 20/30 - OCT shows OS: Mild interval improvement in IRF/IRHM superior fovea and macula, central ORA and SRHM at 6 weeks - recommend IVE OS #7 today, 09.09.24 w/ f/u at 6 wks - pt wishes to proceed with injection   - RBA of procedure discussed, questions answered - IVE informed consent obtained and signed, 12.08.23 (OS) - see procedure note - F/U 6 weeks -- DFE/OCT/possible injection   2,3. Hypertensive retinopathy OU - discussed importance of tight BP control - monitor  4,5. Mild nonproliferative diabetic retinopathy w/o DME OD - The incidence, risk factors for progression, natural history and treatment options for diabetic retinopathy were discussed with patient.   - The need for close  monitoring of blood glucose, blood pressure, and serum lipids, avoiding cigarette or any type of tobacco, and the need for long term follow up was also discussed with patient. - exam shows rare MA OU  - FA (05.17.23) shows rare MA OU; no NV OU -- BRVO OS as above - OCT OD: NFP; Focal cystic changes and IRHM in temporal macula -- persistent/slightly improved - BCVA OD 20/20  - will continue to hold off on anti-VEGF therapy OD for now  - monitor  6. Mixed Cataract OU - The symptoms of cataract, surgical options, and treatments and risks were discussed with patient. - discussed diagnosis and progression - monitor  Ophthalmic Meds Ordered this visit:  Meds ordered this encounter  Medications   aflibercept (EYLEA) SOLN 2 mg     Return in about 6 weeks (around 09/15/2023) for f/u BRVO OS, DFE, OCT.  There are no Patient Instructions on file for this visit.   This document serves as a record of services personally performed by Karie Chimera, MD, PhD. It was created on their behalf by Annalee Genta, COMT. The creation of this record is the provider's dictation and/or activities during the visit.  Electronically signed by: Annalee Genta, COMT 08/05/23 11:35 PM  This document serves as a record of services personally performed by Karie Chimera, MD, PhD. It was created on their behalf by Glee Arvin. Manson Passey, OA an ophthalmic technician. The creation of this record is the provider's dictation and/or activities during the visit.    Electronically signed by: Glee Arvin. Manson Passey, OA 08/05/23 11:35 PM  Karie Chimera, M.D., Ph.D. Diseases & Surgery of the Retina and Vitreous [facility]  I have reviewed the above documentation for accuracy and completeness, and I agree with the above. Karie Chimera, M.D., Ph.D. 08/05/23 11:39 PM   Abbreviations: M myopia (nearsighted); A astigmatism; H hyperopia (farsighted); P presbyopia; Mrx spectacle prescription;  CTL contact lenses; OD  right eye; OS left eye; OU both eyes  XT exotropia; ET esotropia; PEK punctate epithelial keratitis; PEE punctate epithelial erosions; DES dry eye syndrome; MGD meibomian gland dysfunction; ATs artificial tears; PFAT's preservative free artificial tears; NSC nuclear sclerotic cataract; PSC posterior subcapsular cataract; ERM epi-retinal membrane; PVD posterior vitreous detachment; RD retinal detachment; DM diabetes mellitus; DR diabetic retinopathy; NPDR non-proliferative diabetic retinopathy; PDR proliferative diabetic retinopathy; CSME clinically significant macular edema; DME diabetic macular edema; dbh dot blot hemorrhages; CWS cotton wool spot; POAG primary open angle glaucoma; C/D cup-to-disc ratio; HVF humphrey visual field; GVF goldmann visual field; OCT optical coherence tomography; IOP intraocular pressure; BRVO Branch retinal vein occlusion; CRVO central retinal vein occlusion; CRAO central retinal artery occlusion; BRAO branch retinal artery occlusion; RT retinal tear; SB scleral buckle; PPV pars plana vitrectomy; VH Vitreous hemorrhage; PRP panretinal laser photocoagulation; IVK intravitreal kenalog; VMT vitreomacular traction; MH Macular hole;  NVD neovascularization of the disc; NVE neovascularization elsewhere; AREDS age related eye disease study; ARMD age related macular degeneration; POAG primary open angle glaucoma; EBMD epithelial/anterior basement membrane dystrophy; ACIOL anterior chamber intraocular lens; IOL intraocular lens; PCIOL posterior chamber intraocular lens; Phaco/IOL phacoemulsification with intraocular lens placement; PRK photorefractive keratectomy; LASIK laser assisted in situ keratomileusis; HTN hypertension; DM diabetes mellitus; COPD chronic obstructive pulmonary disease

## 2023-08-04 ENCOUNTER — Encounter (INDEPENDENT_AMBULATORY_CARE_PROVIDER_SITE_OTHER): Payer: Self-pay | Admitting: Ophthalmology

## 2023-08-04 ENCOUNTER — Ambulatory Visit (INDEPENDENT_AMBULATORY_CARE_PROVIDER_SITE_OTHER): Payer: 59 | Admitting: Ophthalmology

## 2023-08-04 DIAGNOSIS — H25813 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-related cataract, bilateral: Secondary | ICD-10-CM

## 2023-08-04 DIAGNOSIS — H34832 Tributary (branch) retinal vein occlusion, left eye, with macular edema: Secondary | ICD-10-CM | POA: Diagnosis not present

## 2023-08-04 DIAGNOSIS — I10 Essential (primary) hypertension: Secondary | ICD-10-CM | POA: Diagnosis not present

## 2023-08-04 DIAGNOSIS — H35033 Hypertensive retinopathy, bilateral: Secondary | ICD-10-CM

## 2023-08-04 DIAGNOSIS — Z7984 Long term (current) use of oral hypoglycemic drugs: Secondary | ICD-10-CM | POA: Diagnosis not present

## 2023-08-04 DIAGNOSIS — E113293 Type 2 diabetes mellitus with mild nonproliferative diabetic retinopathy without macular edema, bilateral: Secondary | ICD-10-CM | POA: Diagnosis not present

## 2023-08-04 MED ORDER — AFLIBERCEPT 2MG/0.05ML IZ SOLN FOR KALEIDOSCOPE
2.0000 mg | INTRAVITREAL | Status: AC | PRN
  Administered 2023-08-04: 2 mg via INTRAVITREAL

## 2023-08-06 ENCOUNTER — Other Ambulatory Visit (HOSPITAL_COMMUNITY)
Admission: RE | Admit: 2023-08-06 | Discharge: 2023-08-06 | Disposition: A | Discharge status: Home / Self Care | Payer: 59 | Source: Ambulatory Visit | Attending: Adult Health | Admitting: Adult Health

## 2023-08-06 ENCOUNTER — Encounter: Payer: Self-pay | Admitting: Adult Health

## 2023-08-06 ENCOUNTER — Ambulatory Visit (INDEPENDENT_AMBULATORY_CARE_PROVIDER_SITE_OTHER): Payer: 59 | Admitting: Adult Health

## 2023-08-06 VITALS — BP 143/81 | HR 174 | Ht 67.0 in | Wt 168.0 lb

## 2023-08-06 DIAGNOSIS — Z01419 Encounter for gynecological examination (general) (routine) without abnormal findings: Secondary | ICD-10-CM | POA: Insufficient documentation

## 2023-08-06 DIAGNOSIS — Z124 Encounter for screening for malignant neoplasm of cervix: Secondary | ICD-10-CM | POA: Diagnosis not present

## 2023-08-06 DIAGNOSIS — R87610 Atypical squamous cells of undetermined significance on cytologic smear of cervix (ASC-US): Secondary | ICD-10-CM

## 2023-08-06 DIAGNOSIS — R102 Pelvic and perineal pain: Secondary | ICD-10-CM | POA: Diagnosis not present

## 2023-08-06 DIAGNOSIS — Z1151 Encounter for screening for human papillomavirus (HPV): Secondary | ICD-10-CM | POA: Diagnosis not present

## 2023-08-06 NOTE — Progress Notes (Signed)
  Subjective:     Patient ID: Michelle Morrison, female   DOB: [birth year], 62 y.o.   MRN: [id number]  HPI Michelle Morrison is a 62 year old black female,single, PM in complaining of pelvic pain for about 2 weeks. She needs a pap.     Component Value Date/Time   DIAGPAP (A) 04/01/2022 1008    - Atypical squamous cells of undetermined significance (ASC-US)   DIAGPAP - Low grade squamous intraepithelial lesion (LSIL) (A) 02/27/2021 0919   DIAGPAP  12/18/2018 0000    NEGATIVE FOR INTRAEPITHELIAL LESIONS OR MALIGNANCY.   HPVHIGH Positive (A) 04/01/2022 1008   HPVHIGH Positive (A) 02/27/2021 0919   ADEQPAP  04/01/2022 1008    Satisfactory for evaluation; transformation zone component PRESENT.   ADEQPAP  02/27/2021 0919    Satisfactory for evaluation; transformation zone component ABSENT.   ADEQPAP  12/18/2018 0000    Satisfactory for evaluation  endocervical/transformation zone component ABSENT.    PCP is Dr Felecia Shelling  Review of Systems +pelvic pain for about 2 weeks Denies any problems with urination or BM but had diarrhea today Has pain with sex occasionally Denies any vaginal bleeding  Reviewed past medical,surgical, social and family history. Reviewed medications and allergies.     Objective:   Physical Exam BP (!) 143/81 (BP Location: Right Arm, Patient Position: Sitting, Cuff Size: Normal)   Pulse (!) 174   Ht 5\' 7"  (1.702 m)   Wt 168 lb (76.2 kg)   LMP 05/23/2007   BMI 26.31 kg/m     Skin warm and dry.Pelvic: external genitalia is normal in appearance no lesions, vagina: pale,urethra has no lesions or masses noted, cervix:smooth,pap with HR HPV genotyping performed, uterus: normal size, shape and contour, mildly tender, no masses felt, adnexa: no masses, LLQ tenderness noted. Bladder is non tender and no masses felt. Fall risk is low  Upstream - 08/06/23 1101       Pregnancy Intention Screening   Does the patient want to become pregnant in the next year? N/A    Does the patient's  partner want to become pregnant in the next year? N/A    Would the patient like to discuss contraceptive options today? N/A      Contraception Wrap Up   Current Method Abstinence   PM   End Method Abstinence   PM   Contraception Counseling Provided No            Examination chaperoned by Malachy Mood LPN  Assessment:     1. Routine Papanicolaou smear Pap sent  - Cytology - PAP( [facility])  2. Pelvic pain +pain for 2 weeks Pelvic US scheduled for 08/08/23 at 8 am at [facility] to assess uterus and ovaries  - US PELVIC COMPLETE WITH TRANSVAGINAL; Future  3. Atypical squamous cells of undetermined significance on cytologic smear of cervix (ASC-US) Pap sent    Plan:     Follow up TBD

## 2023-08-07 ENCOUNTER — Ambulatory Visit: Payer: 59 | Attending: Cardiology | Admitting: Cardiology

## 2023-08-07 ENCOUNTER — Other Ambulatory Visit (HOSPITAL_COMMUNITY)
Admission: RE | Admit: 2023-08-07 | Discharge: 2023-08-07 | Disposition: A | Discharge status: Home / Self Care | Payer: 59 | Source: Ambulatory Visit | Attending: Cardiology | Admitting: Cardiology

## 2023-08-07 ENCOUNTER — Encounter: Payer: Self-pay | Admitting: Cardiology

## 2023-08-07 ENCOUNTER — Other Ambulatory Visit (HOSPITAL_COMMUNITY): Payer: Self-pay | Admitting: Internal Medicine

## 2023-08-07 VITALS — BP 140/78 | HR 62 | Ht 67.0 in | Wt 168.0 lb

## 2023-08-07 DIAGNOSIS — I5032 Chronic diastolic (congestive) heart failure: Secondary | ICD-10-CM

## 2023-08-07 DIAGNOSIS — Z9189 Other specified personal risk factors, not elsewhere classified: Secondary | ICD-10-CM | POA: Diagnosis not present

## 2023-08-07 DIAGNOSIS — I10 Essential (primary) hypertension: Secondary | ICD-10-CM | POA: Insufficient documentation

## 2023-08-07 DIAGNOSIS — I4719 Other supraventricular tachycardia: Secondary | ICD-10-CM

## 2023-08-07 DIAGNOSIS — Z1231 Encounter for screening mammogram for malignant neoplasm of breast: Secondary | ICD-10-CM

## 2023-08-07 LAB — LIPID PANEL
Cholesterol: 181 mg/dL (ref 0–200)
HDL: 64 mg/dL (ref >40)
LDL Cholesterol: 95 mg/dL (ref 0–99)
Total CHOL/HDL Ratio: 2.8 ratio
Triglycerides: 112 mg/dL (ref <150)
VLDL: 22 mg/dL (ref 0–40)

## 2023-08-07 LAB — BASIC METABOLIC PANEL
Anion gap: 10 (ref 5–15)
BUN: 18 mg/dL (ref 8–23)
CO2: 28 mmol/L (ref 22–32)
Calcium: 8.9 mg/dL (ref 8.9–10.3)
Chloride: 98 mmol/L (ref 98–111)
Creatinine, Ser: 1.14 mg/dL — ABNORMAL HIGH (ref 0.44–1.00)
GFR, Estimated: 55 mL/min — ABNORMAL LOW (ref >60)
Glucose, Bld: 104 mg/dL — ABNORMAL HIGH (ref 70–99)
Potassium: 4.2 mmol/L (ref 3.5–5.1)
Sodium: 136 mmol/L (ref 135–145)

## 2023-08-07 LAB — CYTOLOGY - PAP
Adequacy: ABSENT
Comment: NEGATIVE
Comment: NEGATIVE
Comment: NEGATIVE
Diagnosis: UNDETERMINED — AB
HPV 16: NEGATIVE
HPV 18 / 45: NEGATIVE
High risk HPV: POSITIVE — AB

## 2023-08-07 NOTE — Patient Instructions (Signed)
Medication Instructions:  Your physician recommends that you continue on your current medications as directed. Please refer to the Current Medication list given to you today.  *If you need a refill on your cardiac medications before your next appointment, please call your pharmacy*   Lab Work: TODAY: -BMET -FLP  If you have labs (blood work) drawn today and your tests are completely normal, you will receive your results only by: MyChart Message (if you have MyChart) OR A paper copy in the mail If you have any lab test that is abnormal or we need to change your treatment, we will call you to review the results.   Testing/Procedures: None   Follow-Up: At [facility], you and your health needs are our priority.  As part of our continuing mission to provide you with exceptional heart care, we have created designated Provider Care Teams.  These Care Teams include your primary Cardiologist (physician) and Advanced Practice Providers (APPs -  Physician Assistants and Nurse Practitioners) who all work together to provide you with the care you need, when you need it.  We recommend signing up for the patient portal called "MyChart".  Sign up information is provided on this After Visit Summary.  MyChart is used to connect with patients for Virtual Visits (Telemedicine).  Patients are able to view lab/test results, encounter notes, upcoming appointments, etc.  Non-urgent messages can be sent to your provider as well.   To learn more about what you can do with MyChart, go to [email or web address].    Your next appointment:   4 month(s)  Provider:   You may see Dina Rich, MD or one of the following Advanced Practice Providers on your designated Care Team:   Randall An, PA-C  Jacolyn Reedy, PA-C     Other Instructions You have been referred to Pulmonology. They will contact you with your first appointment.

## 2023-08-07 NOTE — Progress Notes (Signed)
Clinical Summary Michelle Morrison is a 62 y.o.female seen today for follow up of the following medical problem.s     1. Chronic systolic/diastolic HF - admit 02/980 with pulmonary edema - EKG with LBBB, had WMA on echo referred for cath - 03/2020 cath: normal coronaries, LVEDP 10 - 03/2020 echo: LVEF 45%, anteroseptal wall hypokinesis, grade II diastolic dysfunction     Admitted 01/2021 with acute on chronic HF 01/2021 echo: LVEF 25-30%, mod to severe RV dysfunction, mod to severe TR - difficult to diurese with low bp's, worsening renal function. 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      07/2021 echo: LVEF 55-60%, no WMAs, grade I dd.    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 HTN - compliant with meds     3. Hyperlipidemia -compliant with meds     4. PSVT/Atach - had some runs during prior hospital admission - 04/2020 monitor Min HR 38, Max HR 163, Avg HR 72. Min HR occurred in early AM hours presumably while sleeping Telemetry tracings show sinus rhythm and sinus bradycardia,several runs of atach with aberrancy up to 30 seconds.. Runs of atach followed by postconversion pauses up to 1.4 seconds. 5 beat run of NSVT No symptoms reported     01/2021 admissoins issues with atach, difficult to control due to intermittent bradycardia - started on amio gtt, transitioned to oral amio - has been on dronederone, followed by EP  04/2023 monitor: min HR 33, Max HR 187, Avg HR 55. 3 NSVT episodes.  51 SVT episoddes longest 35 seconds.  - no recent palpitations.      5. OSA screen - no snoring, +daytime somnolence, no witnessed apneic episodes - HF, difficult to control atrial arrythmias, HTN    Past Medical History:  Diagnosis Date   CHF (congestive heart failure) (HCC)    a. EF 45% by echo in 03/2020 with normal cors by cath   Diabetes mellitus without complication (HCC)    Diarrhea    Heart disease    Herpes simplex infection    Hyperlipidemia    Hypertension      No Known Allergies   Current Outpatient Medications  Medication Sig Dispense Refill   albuterol (VENTOLIN HFA) 108 (90 Base) MCG/ACT inhaler Inhale 1-2 puffs into the lungs every 4 (four) hours as needed.     atorvastatin (LIPITOR) 80 MG tablet Take 1 tablet (80 mg total) by mouth daily. 90 tablet 3   dapagliflozin propanediol (FARXIGA) 10 MG TABS tablet TAKE 1 TABLET (10 MG TOTAL) BY MOUTH DAILY. 30 tablet 6   diclofenac Sodium (VOLTAREN) 1 % GEL Apply topically.     dronedarone (MULTAQ) 400 MG tablet Take 1 tablet (400 mg total) by mouth 2 (two) times daily with a meal. 180 tablet 3   hydrALAZINE (APRESOLINE) 50 MG tablet Take 1.5 tablets (75 mg total) by mouth 3 (three) times daily. 405 tablet 3   isosorbide mononitrate (IMDUR) 30  MG 24 hr tablet Take 1 tablet (30 mg total) by mouth daily. 90 tablet 3   sacubitril-valsartan (ENTRESTO) 24-26 MG Take 1 tablet by mouth 2 (two) times daily. 180 tablet 3   TRADJENTA 5 MG TABS tablet Take 5 mg by mouth daily.     No current facility-administered medications for this visit.     Past Surgical History:  Procedure Laterality Date   CESAREAN SECTION     CHOLECYSTECTOMY     COLONOSCOPY N/A 08/26/2014   Procedure: COLONOSCOPY;  Surgeon: West Bali, MD;  Location: [facility];  Service: Endoscopy;  Laterality: N/A;  9:30 AM - moved to 8:30 - Ginger to notify pt   LEFT HEART CATH AND CORONARY ANGIOGRAPHY N/A 04/14/2020   Procedure: LEFT HEART CATH AND  CORONARY ANGIOGRAPHY;  Surgeon: Swaziland, [name], MD;  Location: [facility] INVASIVE CV LAB;  Service: Cardiovascular;  Laterality: N/A;   RIGHT/LEFT HEART CATH AND CORONARY ANGIOGRAPHY N/A 02/19/2021   Procedure: RIGHT/LEFT HEART CATH AND CORONARY ANGIOGRAPHY;  Surgeon: Laurey Morale, MD;  Location: [facility] INVASIVE CV LAB;  Service: Cardiovascular;  Laterality: N/A;     No Known Allergies    Family History  Problem Relation Age of Onset   Hypertension Mother    Diabetes Mother    Colon cancer Neg Hx      Social History Michelle Morrison reports that she has never smoked. She has never used smokeless tobacco. Michelle Morrison reports no history of alcohol use.   Review of Systems CONSTITUTIONAL: No weight loss, fever, chills, weakness or fatigue.  HEENT: Eyes: No visual loss, blurred vision, double vision or yellow sclerae.No hearing loss, sneezing, congestion, runny nose or sore throat.  SKIN: No rash or itching.  CARDIOVASCULAR: per hpi RESPIRATORY: No shortness of breath, cough or sputum.  GASTROINTESTINAL: No anorexia, nausea, vomiting or diarrhea. No abdominal pain or blood.  GENITOURINARY: No burning on urination, no polyuria NEUROLOGICAL: No headache, dizziness, syncope, paralysis, ataxia, numbness or tingling in the extremities. No change in bowel or bladder control.  MUSCULOSKELETAL: No muscle, back pain, joint pain or stiffness.  LYMPHATICS: No enlarged nodes. No history of splenectomy.  PSYCHIATRIC: No history of depression or anxiety.  ENDOCRINOLOGIC: No reports of sweating, cold or heat intolerance. No polyuria or polydipsia.  Marland Kitchen   Physical Examination Today's Vitals   08/07/23 0847 08/07/23 0905  BP: (!) 158/66 (!) 140/78  Pulse: 62   SpO2: 96%   Weight: 168 lb (76.2 kg)   Height: 5\' 7"  (1.702 m)    Body mass index is 26.31 kg/m.  Gen: resting comfortably, no acute distress HEENT: no scleral icterus, pupils equal round and reactive, no palptable cervical adenopathy,  CV:  RRR, no mrg, no jvd Resp: Clear to auscultation bilaterally GI: abdomen is soft, non-tender, non-distended, normal bowel sounds, no hepatosplenomegaly MSK: extremities are warm, no edema.  Skin: warm, no rash Neuro:  no focal deficits Psych: appropriate affect   Diagnostic Studies 01/2021 echo IMPRESSIONS     1. Global hypokinesis with more prominent anteroseptal hypokinesis. .  Left ventricular ejection fraction, by estimation, is 25 to 30%. The left  ventricle has severely decreased function. The left ventricle demonstrates  global hypokinesis. There is  moderate left ventricular hypertrophy. Left ventricular diastolic  parameters are indeterminate.   2. Right ventricular systolic function moderately to severely reduced.  The right ventricular size is severely enlarged. There is moderately  elevated pulmonary artery systolic pressure.   3. Left atrial size was  severely dilated.   4. Right atrial size was severely dilated.   5. The mitral valve is normal in structure. No evidence of mitral valve  regurgitation. No evidence of mitral stenosis.   6. By color TR looks moderate. Hepatic systolic flow reversal would  suggest more severe TR. . The tricuspid valve is abnormal. Tricuspid valve  regurgitation is moderate to severe.   7. The aortic valve has an indeterminant number of cusps. Aortic valve  regurgitation is not visualized. No aortic stenosis is present.   8. The inferior vena cava is normal in size with <50% respiratory  variability, suggesting right atrial pressure of 8 mmHg.    01/2021 RHC/LHC 1. Low filling pressures.  2. Preserved cardiac output.  3. No significant CAD.      01/2021 CMRI IMPRESSION: 1. Normal LV size with EF 20%, diffuse hypokinesis with septal-lateral dyssynchrony c/w LBBB.   2.  Normal RV size with EF 26%.   3. Diffuse delayed enhancement imaging, however there does appear to be LGE in the septum that could be in a coronary  disease-type pattern.     03/2023 echo 1. Left ventricular ejection fraction, by estimation, is 45%. The left  ventricle has mildly decreased function. The left ventricle demonstrates  regional wall motion abnormalities (see scoring diagram/findings for  description). There is mild left  ventricular hypertrophy. Left ventricular diastolic parameters are  consistent with Grade III diastolic dysfunction (restrictive). Elevated  left atrial pressure.   2. Right ventricular systolic function is normal. The right ventricular  size is normal. There is normal pulmonary artery systolic pressure.   3. Left atrial size was moderately dilated.   4. The mitral valve is normal in structure. Trivial mitral valve  regurgitation. No evidence of mitral stenosis.   5. The tricuspid valve is abnormal.   6. The aortic valve is tricuspid. Aortic valve regurgitation is not  visualized. No aortic stenosis is present.   7. The inferior vena cava is normal in size with greater than 50%  respiratory variability, suggesting right atrial pressure of 3 mmHg.   Assessment and Plan   1.Chronic  HFimpEF - LVEF much improved, however on most recent check slight decline back to 45% - no significant symptoms - HF clinic just started entresto 3 months ago. Labs were stable after, repeat labs again. If remain stable increase entresto. Prior issues with hyperkalemia on aldactone, watch closely on entresto. No beta blocker due to bradycardia.    2. Atach/PSVT - has done well on dronederone without symptoms - continue current meds, continue to follow with EP - no beta blocker due to bradycardia.    3.HTN -bp elevated here. If labs ok would increase entresto as reported above, if cannot increase entresto add could increase hydralazine.   4. OSA screen - signs and symptoms of OSA. HF clinic had also requested gettnig patient arranged for sleep eval - refer to pulmonary for evaluation.    Antoine Poche, M.D.

## 2023-08-08 ENCOUNTER — Ambulatory Visit (HOSPITAL_COMMUNITY): Payer: 59

## 2023-08-08 ENCOUNTER — Encounter: Payer: Self-pay | Admitting: Adult Health

## 2023-08-08 DIAGNOSIS — R87610 Atypical squamous cells of undetermined significance on cytologic smear of cervix (ASC-US): Secondary | ICD-10-CM | POA: Insufficient documentation

## 2023-08-11 ENCOUNTER — Ambulatory Visit (INDEPENDENT_AMBULATORY_CARE_PROVIDER_SITE_OTHER): Payer: 59 | Admitting: Obstetrics & Gynecology

## 2023-08-11 ENCOUNTER — Encounter: Payer: Self-pay | Admitting: Obstetrics & Gynecology

## 2023-08-11 VITALS — BP 160/66 | HR 80 | Ht 67.0 in | Wt 168.0 lb

## 2023-08-11 DIAGNOSIS — R8789 Other abnormal findings in specimens from female genital organs: Secondary | ICD-10-CM | POA: Diagnosis not present

## 2023-08-11 DIAGNOSIS — I5042 Chronic combined systolic (congestive) and diastolic (congestive) heart failure: Secondary | ICD-10-CM | POA: Diagnosis not present

## 2023-08-11 DIAGNOSIS — R87610 Atypical squamous cells of undetermined significance on cytologic smear of cervix (ASC-US): Secondary | ICD-10-CM

## 2023-08-11 DIAGNOSIS — Z23 Encounter for immunization: Secondary | ICD-10-CM | POA: Diagnosis not present

## 2023-08-11 DIAGNOSIS — I10 Essential (primary) hypertension: Secondary | ICD-10-CM | POA: Diagnosis not present

## 2023-08-11 DIAGNOSIS — E785 Hyperlipidemia, unspecified: Secondary | ICD-10-CM | POA: Diagnosis not present

## 2023-08-11 DIAGNOSIS — E1122 Type 2 diabetes mellitus with diabetic chronic kidney disease: Secondary | ICD-10-CM | POA: Diagnosis not present

## 2023-08-11 NOTE — Progress Notes (Signed)
    Colposcopy Procedure Note:    Colposcopy Procedure Note  Indications:  2024 ASCUS +HR HPV, neg 16/18/45 2023 ASCUS +HR HPV, neg 16/18/45 2022 LSIL      +HR HPV, neg 16/18/45   2019 ASCCP recommendation:  Smoker:  No. New sexual partner:  No.  :  History of abnormal Pap: yes  Procedure Details  The risks and benefits of the procedure and Written informed consent obtained.  Speculum placed in vagina and excellent visualization of cervix achieved, cervix swabbed x 3 with acetic acid solution.  Findings: Adequate colposcopy is noted today.  Cervix: no visible lesions, no mosaicism, no punctation, and no abnormal vasculature; SCJ visualized 360 degrees without lesions and no biopsies taken. Vaginal inspection: vaginal colposcopy not performed. Vulvar colposcopy: vulvar colposcopy not performed.  Specimens: none  Complications: none.  Colposcopic Impression: Normal colposcopy  Plan(Based on 2019 ASCCP recommendations) Repeat HPV based cytology 1 year

## 2023-08-14 ENCOUNTER — Other Ambulatory Visit: Payer: Self-pay | Admitting: Cardiology

## 2023-08-14 ENCOUNTER — Ambulatory Visit (HOSPITAL_COMMUNITY)
Admission: RE | Admit: 2023-08-14 | Discharge: 2023-08-14 | Disposition: A | Discharge status: Home / Self Care | Payer: 59 | Source: Ambulatory Visit | Attending: Adult Health | Admitting: Adult Health

## 2023-08-14 DIAGNOSIS — R9389 Abnormal findings on diagnostic imaging of other specified body structures: Secondary | ICD-10-CM | POA: Diagnosis not present

## 2023-08-14 DIAGNOSIS — R102 Pelvic and perineal pain: Secondary | ICD-10-CM | POA: Diagnosis present

## 2023-08-19 ENCOUNTER — Other Ambulatory Visit: Payer: 59 | Admitting: Obstetrics & Gynecology

## 2023-08-19 ENCOUNTER — Telehealth: Payer: Self-pay

## 2023-08-19 DIAGNOSIS — I5032 Chronic diastolic (congestive) heart failure: Secondary | ICD-10-CM

## 2023-08-19 MED ORDER — SACUBITRIL-VALSARTAN 49-51 MG PO TABS: 1.0000 | ORAL_TABLET | Freq: Two times a day (BID) | ORAL | 11 refills | Status: DC

## 2023-08-19 NOTE — Telephone Encounter (Signed)
-----   Message from Dina Rich sent at 08/19/2023  1:11 PM EDT ----- Labs look good. Please increase entresto to 49/51mg  bid and repeat bmet in 2 weeks please, the higher dose is more helpful at strengthening her heart.   Dominga Ferry MD

## 2023-08-19 NOTE — Telephone Encounter (Signed)
Patient notified and verbalized understanding. Pt had no questions or concerns at this time. PCP copied.  ?

## 2023-08-26 ENCOUNTER — Observation Stay (HOSPITAL_COMMUNITY): Payer: 59

## 2023-08-26 ENCOUNTER — Inpatient Hospital Stay (HOSPITAL_COMMUNITY)
Admission: EM | Admit: 2023-08-26 | Discharge: 2023-08-31 | DRG: 309 | Disposition: A | Discharge status: Home / Self Care | Payer: 59 | Attending: Internal Medicine | Admitting: Internal Medicine

## 2023-08-26 ENCOUNTER — Encounter (HOSPITAL_COMMUNITY): Payer: Self-pay

## 2023-08-26 ENCOUNTER — Encounter: Payer: Self-pay | Admitting: Adult Health

## 2023-08-26 ENCOUNTER — Ambulatory Visit: Payer: 59 | Admitting: Adult Health

## 2023-08-26 ENCOUNTER — Other Ambulatory Visit: Payer: Self-pay

## 2023-08-26 VITALS — BP 132/86 | HR 165 | Ht 66.0 in | Wt 175.0 lb

## 2023-08-26 DIAGNOSIS — R8290 Unspecified abnormal findings in urine: Secondary | ICD-10-CM | POA: Diagnosis not present

## 2023-08-26 DIAGNOSIS — I428 Other cardiomyopathies: Secondary | ICD-10-CM | POA: Diagnosis present

## 2023-08-26 DIAGNOSIS — R1011 Right upper quadrant pain: Secondary | ICD-10-CM

## 2023-08-26 DIAGNOSIS — Z9049 Acquired absence of other specified parts of digestive tract: Secondary | ICD-10-CM | POA: Diagnosis not present

## 2023-08-26 DIAGNOSIS — E118 Type 2 diabetes mellitus with unspecified complications: Secondary | ICD-10-CM | POA: Diagnosis not present

## 2023-08-26 DIAGNOSIS — E1122 Type 2 diabetes mellitus with diabetic chronic kidney disease: Secondary | ICD-10-CM | POA: Diagnosis present

## 2023-08-26 DIAGNOSIS — R6510 Systemic inflammatory response syndrome (SIRS) of non-infectious origin without acute organ dysfunction: Secondary | ICD-10-CM | POA: Diagnosis not present

## 2023-08-26 DIAGNOSIS — J9811 Atelectasis: Secondary | ICD-10-CM | POA: Diagnosis not present

## 2023-08-26 DIAGNOSIS — Z833 Family history of diabetes mellitus: Secondary | ICD-10-CM | POA: Diagnosis not present

## 2023-08-26 DIAGNOSIS — E1165 Type 2 diabetes mellitus with hyperglycemia: Secondary | ICD-10-CM | POA: Diagnosis not present

## 2023-08-26 DIAGNOSIS — I4719 Other supraventricular tachycardia: Secondary | ICD-10-CM | POA: Diagnosis present

## 2023-08-26 DIAGNOSIS — Z8619 Personal history of other infectious and parasitic diseases: Secondary | ICD-10-CM | POA: Diagnosis not present

## 2023-08-26 DIAGNOSIS — R011 Cardiac murmur, unspecified: Secondary | ICD-10-CM | POA: Diagnosis not present

## 2023-08-26 DIAGNOSIS — I447 Left bundle-branch block, unspecified: Secondary | ICD-10-CM | POA: Diagnosis present

## 2023-08-26 DIAGNOSIS — Z8249 Family history of ischemic heart disease and other diseases of the circulatory system: Secondary | ICD-10-CM

## 2023-08-26 DIAGNOSIS — Z79899 Other long term (current) drug therapy: Secondary | ICD-10-CM | POA: Diagnosis not present

## 2023-08-26 DIAGNOSIS — N1831 Chronic kidney disease, stage 3a: Secondary | ICD-10-CM | POA: Diagnosis present

## 2023-08-26 DIAGNOSIS — I420 Dilated cardiomyopathy: Secondary | ICD-10-CM | POA: Diagnosis not present

## 2023-08-26 DIAGNOSIS — I4891 Unspecified atrial fibrillation: Secondary | ICD-10-CM | POA: Diagnosis not present

## 2023-08-26 DIAGNOSIS — I517 Cardiomegaly: Secondary | ICD-10-CM | POA: Diagnosis not present

## 2023-08-26 DIAGNOSIS — I471 Supraventricular tachycardia, unspecified: Secondary | ICD-10-CM | POA: Diagnosis not present

## 2023-08-26 DIAGNOSIS — E785 Hyperlipidemia, unspecified: Secondary | ICD-10-CM | POA: Diagnosis not present

## 2023-08-26 DIAGNOSIS — K219 Gastro-esophageal reflux disease without esophagitis: Secondary | ICD-10-CM | POA: Diagnosis present

## 2023-08-26 DIAGNOSIS — I5020 Unspecified systolic (congestive) heart failure: Secondary | ICD-10-CM | POA: Diagnosis not present

## 2023-08-26 DIAGNOSIS — I10 Essential (primary) hypertension: Secondary | ICD-10-CM | POA: Diagnosis present

## 2023-08-26 DIAGNOSIS — R109 Unspecified abdominal pain: Secondary | ICD-10-CM | POA: Diagnosis not present

## 2023-08-26 DIAGNOSIS — I4892 Unspecified atrial flutter: Principal | ICD-10-CM | POA: Diagnosis present

## 2023-08-26 DIAGNOSIS — R1013 Epigastric pain: Secondary | ICD-10-CM

## 2023-08-26 DIAGNOSIS — I5022 Chronic systolic (congestive) heart failure: Secondary | ICD-10-CM | POA: Diagnosis present

## 2023-08-26 DIAGNOSIS — I130 Hypertensive heart and chronic kidney disease with heart failure and stage 1 through stage 4 chronic kidney disease, or unspecified chronic kidney disease: Secondary | ICD-10-CM | POA: Diagnosis present

## 2023-08-26 DIAGNOSIS — J90 Pleural effusion, not elsewhere classified: Secondary | ICD-10-CM | POA: Diagnosis not present

## 2023-08-26 DIAGNOSIS — Z7984 Long term (current) use of oral hypoglycemic drugs: Secondary | ICD-10-CM

## 2023-08-26 DIAGNOSIS — R002 Palpitations: Secondary | ICD-10-CM | POA: Diagnosis not present

## 2023-08-26 DIAGNOSIS — R112 Nausea with vomiting, unspecified: Secondary | ICD-10-CM

## 2023-08-26 HISTORY — DX: Other supraventricular tachycardia: I47.19

## 2023-08-26 LAB — COMPREHENSIVE METABOLIC PANEL
ALT: 26 U/L (ref 0–44)
AST: 27 U/L (ref 15–41)
Albumin: 3.4 g/dL — ABNORMAL LOW (ref 3.5–5.0)
Alkaline Phosphatase: 96 U/L (ref 38–126)
Anion gap: 12 (ref 5–15)
BUN: 27 mg/dL — ABNORMAL HIGH (ref 8–23)
CO2: 26 mmol/L (ref 22–32)
Calcium: 8.3 mg/dL — ABNORMAL LOW (ref 8.9–10.3)
Chloride: 97 mmol/L — ABNORMAL LOW (ref 98–111)
Creatinine, Ser: 1.32 mg/dL — ABNORMAL HIGH (ref 0.44–1.00)
GFR, Estimated: 46 mL/min — ABNORMAL LOW (ref >60)
Glucose, Bld: 144 mg/dL — ABNORMAL HIGH (ref 70–99)
Potassium: 3.6 mmol/L (ref 3.5–5.1)
Sodium: 135 mmol/L (ref 135–145)
Total Bilirubin: 0.9 mg/dL (ref 0.3–1.2)
Total Protein: 7.4 g/dL (ref 6.5–8.1)

## 2023-08-26 LAB — CBC
HCT: 36.4 % (ref 36.0–46.0)
Hemoglobin: 11.8 g/dL — ABNORMAL LOW (ref 12.0–15.0)
MCH: 29.7 pg (ref 26.0–34.0)
MCHC: 32.4 g/dL (ref 30.0–36.0)
MCV: 91.7 fL (ref 80.0–100.0)
Platelets: 285 10*3/uL (ref 150–400)
RBC: 3.97 MIL/uL (ref 3.87–5.11)
RDW: 14.4 % (ref 11.5–15.5)
WBC: 11.2 10*3/uL — ABNORMAL HIGH (ref 4.0–10.5)
nRBC: 0.3 % — ABNORMAL HIGH (ref 0.0–0.2)

## 2023-08-26 LAB — POCT URINALYSIS DIPSTICK OB
Blood, UA: NEGATIVE
Ketones, UA: NEGATIVE
Leukocytes, UA: NEGATIVE
Nitrite, UA: NEGATIVE

## 2023-08-26 LAB — POCT URINALYSIS DIPSTICK
Blood, UA: NEGATIVE
Glucose, UA: POSITIVE — AB
Leukocytes, UA: NEGATIVE
Nitrite, UA: NEGATIVE
Protein, UA: POSITIVE — AB

## 2023-08-26 LAB — GLUCOSE, CAPILLARY
Glucose-Capillary: 175 mg/dL — ABNORMAL HIGH (ref 70–99)
Glucose-Capillary: 155 mg/dL — ABNORMAL HIGH (ref 70–99)

## 2023-08-26 LAB — TSH: TSH: 2.894 u[IU]/mL (ref 0.350–4.500)

## 2023-08-26 LAB — PROCALCITONIN: Procalcitonin: <0.1 ng/mL

## 2023-08-26 LAB — LIPASE, BLOOD: Lipase: 23 U/L (ref 11–51)

## 2023-08-26 LAB — APTT: aPTT: 28 s (ref 24–36)

## 2023-08-26 LAB — TROPONIN I (HIGH SENSITIVITY)
Troponin I (High Sensitivity): 38 ng/L — ABNORMAL HIGH (ref <18)
Troponin I (High Sensitivity): 29 ng/L — ABNORMAL HIGH (ref <18)

## 2023-08-26 LAB — MRSA NEXT GEN BY PCR, NASAL: MRSA by PCR Next Gen: NOT DETECTED

## 2023-08-26 LAB — BRAIN NATRIURETIC PEPTIDE: B Natriuretic Peptide: 2159 pg/mL — ABNORMAL HIGH (ref 0.0–100.0)

## 2023-08-26 LAB — LACTIC ACID, PLASMA: Lactic Acid, Venous: 1.4 mmol/L (ref 0.5–1.9)

## 2023-08-26 LAB — PROTIME-INR
INR: 1.2 (ref 0.8–1.2)
Prothrombin Time: 15.8 s — ABNORMAL HIGH (ref 11.4–15.2)

## 2023-08-26 MED ORDER — METRONIDAZOLE 500 MG/100ML IV SOLN
500.0000 mg | Freq: Two times a day (BID) | INTRAVENOUS | Status: DC
  Administered 2023-08-26: 500 mg via INTRAVENOUS
  Filled 2023-08-26: qty 100

## 2023-08-26 MED ORDER — ENOXAPARIN SODIUM 40 MG/0.4ML IJ SOSY
40.0000 mg | PREFILLED_SYRINGE | INTRAMUSCULAR | Status: DC
  Administered 2023-08-26 – 2023-08-30 (×5): 40 mg via SUBCUTANEOUS
  Filled 2023-08-26 (×5): qty 0.4

## 2023-08-26 MED ORDER — ORAL CARE MOUTH RINSE: 15.0000 mL | OROMUCOSAL | Status: DC | PRN

## 2023-08-26 MED ORDER — ISOSORBIDE MONONITRATE ER 30 MG PO TB24
30.0000 mg | ORAL_TABLET | Freq: Every day | ORAL | Status: DC
  Administered 2023-08-27 – 2023-08-31 (×5): 30 mg via ORAL
  Filled 2023-08-26 (×5): qty 1

## 2023-08-26 MED ORDER — PANTOPRAZOLE SODIUM 40 MG PO TBEC
40.0000 mg | DELAYED_RELEASE_TABLET | Freq: Two times a day (BID) | ORAL | Status: DC
  Administered 2023-08-26 – 2023-08-27 (×2): 40 mg via ORAL
  Filled 2023-08-26 (×2): qty 1

## 2023-08-26 MED ORDER — METOCLOPRAMIDE HCL 5 MG/ML IJ SOLN: 10.0000 mg | Freq: Once | INTRAMUSCULAR | Status: DC

## 2023-08-26 MED ORDER — SACUBITRIL-VALSARTAN 24-26 MG PO TABS
1.0000 | ORAL_TABLET | Freq: Two times a day (BID) | ORAL | Status: DC
  Administered 2023-08-27 – 2023-08-31 (×8): 1 via ORAL
  Filled 2023-08-26 (×9): qty 1

## 2023-08-26 MED ORDER — AMIODARONE HCL IN DEXTROSE 360-4.14 MG/200ML-% IV SOLN
60.0000 mg/h | INTRAVENOUS | Status: AC
  Administered 2023-08-26 (×2): 60 mg/h via INTRAVENOUS
  Filled 2023-08-26 (×2): qty 200

## 2023-08-26 MED ORDER — ADENOSINE 6 MG/2ML IV SOLN
INTRAVENOUS | Status: AC
  Filled 2023-08-26: qty 2

## 2023-08-26 MED ORDER — LACTATED RINGERS IV SOLN
150.0000 mL/h | INTRAVENOUS | Status: DC
  Administered 2023-08-26: 150 mL/h via INTRAVENOUS

## 2023-08-26 MED ORDER — HYDRALAZINE HCL 25 MG PO TABS
75.0000 mg | ORAL_TABLET | Freq: Three times a day (TID) | ORAL | Status: DC
  Administered 2023-08-27 – 2023-08-31 (×12): 75 mg via ORAL
  Filled 2023-08-26 (×13): qty 3

## 2023-08-26 MED ORDER — ACETAMINOPHEN 650 MG RE SUPP: 650.0000 mg | Freq: Four times a day (QID) | RECTAL | Status: DC | PRN

## 2023-08-26 MED ORDER — INSULIN ASPART 100 UNIT/ML IJ SOLN: 0.0000 [IU] | Freq: Every day | INTRAMUSCULAR | Status: DC

## 2023-08-26 MED ORDER — AMIODARONE HCL IN DEXTROSE 360-4.14 MG/200ML-% IV SOLN
30.0000 mg/h | INTRAVENOUS | Status: DC
  Administered 2023-08-26 – 2023-08-31 (×9): 30 mg/h via INTRAVENOUS
  Filled 2023-08-26 (×9): qty 200

## 2023-08-26 MED ORDER — ONDANSETRON HCL 4 MG/2ML IJ SOLN
4.0000 mg | Freq: Four times a day (QID) | INTRAMUSCULAR | Status: DC | PRN
  Administered 2023-08-26 – 2023-08-27 (×2): 4 mg via INTRAVENOUS
  Filled 2023-08-26 (×2): qty 2

## 2023-08-26 MED ORDER — VANCOMYCIN HCL IN DEXTROSE 1-5 GM/200ML-% IV SOLN
1000.0000 mg | Freq: Once | INTRAVENOUS | Status: AC
  Administered 2023-08-27: 1000 mg via INTRAVENOUS
  Filled 2023-08-26: qty 200

## 2023-08-26 MED ORDER — CHLORHEXIDINE GLUCONATE CLOTH 2 % EX PADS
6.0000 | MEDICATED_PAD | Freq: Every day | CUTANEOUS | Status: DC
  Administered 2023-08-27 – 2023-08-31 (×5): 6 via TOPICAL

## 2023-08-26 MED ORDER — SODIUM CHLORIDE 0.9 % IV SOLN
2.0000 g | Freq: Once | INTRAVENOUS | Status: AC
  Administered 2023-08-26: 2 g via INTRAVENOUS
  Filled 2023-08-26: qty 12.5

## 2023-08-26 MED ORDER — HYDROMORPHONE HCL 1 MG/ML IJ SOLN
1.0000 mg | Freq: Four times a day (QID) | INTRAMUSCULAR | Status: DC | PRN
  Administered 2023-08-26: 1 mg via INTRAVENOUS
  Filled 2023-08-26: qty 1

## 2023-08-26 MED ORDER — DAPAGLIFLOZIN PROPANEDIOL 10 MG PO TABS
10.0000 mg | ORAL_TABLET | Freq: Every day | ORAL | Status: DC
  Administered 2023-08-27 – 2023-08-31 (×5): 10 mg via ORAL
  Filled 2023-08-26 (×5): qty 1

## 2023-08-26 MED ORDER — ADENOSINE 6 MG/2ML IV SOLN
6.0000 mg | Freq: Once | INTRAVENOUS | Status: AC
  Administered 2023-08-26: 6 mg via INTRAVENOUS

## 2023-08-26 MED ORDER — ACETAMINOPHEN 325 MG PO TABS
650.0000 mg | ORAL_TABLET | Freq: Four times a day (QID) | ORAL | Status: DC | PRN
  Administered 2023-08-26: 650 mg via ORAL
  Filled 2023-08-26: qty 2

## 2023-08-26 MED ORDER — ATORVASTATIN CALCIUM 40 MG PO TABS
80.0000 mg | ORAL_TABLET | Freq: Every day | ORAL | Status: DC
  Administered 2023-08-27 – 2023-08-31 (×5): 80 mg via ORAL
  Filled 2023-08-26 (×5): qty 2

## 2023-08-26 MED ORDER — INSULIN ASPART 100 UNIT/ML IJ SOLN
0.0000 [IU] | Freq: Three times a day (TID) | INTRAMUSCULAR | Status: DC
  Administered 2023-08-27: 3 [IU] via SUBCUTANEOUS
  Administered 2023-08-27: 2 [IU] via SUBCUTANEOUS
  Administered 2023-08-29 (×2): 1 [IU] via SUBCUTANEOUS
  Administered 2023-08-30 (×2): 2 [IU] via SUBCUTANEOUS

## 2023-08-26 MED ORDER — ONDANSETRON HCL 4 MG PO TABS: 4.0000 mg | ORAL_TABLET | Freq: Four times a day (QID) | ORAL | Status: DC | PRN

## 2023-08-26 MED ORDER — AMIODARONE LOAD VIA INFUSION
150.0000 mg | Freq: Once | INTRAVENOUS | Status: AC
  Administered 2023-08-26: 150 mg via INTRAVENOUS
  Filled 2023-08-26: qty 83.34

## 2023-08-26 NOTE — ED Triage Notes (Signed)
Pt sent from UC for r/o Cholecystectomy, pt HR-165 in triage, pain started on Sunday.

## 2023-08-26 NOTE — ED Notes (Signed)
ED TO INPATIENT HANDOFF REPORT  ED Nurse Name and Phone #: Earlean Shawl [phone]  S Name/Age/Gender Michelle Morrison 62 y.o. female Room/Bed: APA08/APA08  Code Status   Code Status: Prior  Home/SNF/Other Home Patient oriented to: self, place, time, and situation Is this baseline? Yes   Triage Complete: Triage complete  Chief Complaint Atrial flutter [facility]) [I48.92]  Triage Note Pt sent from UC for r/o Cholecystectomy, pt HR-165 in triage, pain started on Sunday.    Allergies No Known Allergies  Level of Care/Admitting Diagnosis ED Disposition     ED Disposition  Admit   Condition  --   Comment  Hospital Area: [facility] [[id number]]  Level of Care: Stepdown [14]  Covid Evaluation: Asymptomatic - no recent exposure (last 10 days) testing not required  Diagnosis: Atrial flutter (HCC) [427.32.ICD-9-CM]  Admitting Physician: Vassie Loll [[id number]]  Attending Physician: Vassie Loll [[id number]]          B Medical/Surgery History Past Medical History:  Diagnosis Date   CHF (congestive heart failure) (HCC)    a. EF 45% by echo in 03/2020 with normal cors by cath   Diabetes mellitus without complication (HCC)    Diarrhea    Heart disease    Herpes simplex infection    Hyperlipidemia    Hypertension    Past Surgical History:  Procedure Laterality Date   CESAREAN SECTION     CHOLECYSTECTOMY     COLONOSCOPY N/A 08/26/2014   Procedure: COLONOSCOPY;  Surgeon: West Bali, MD;  Location[facility];  Service: Endoscopy;  Laterality: N/A;  9:30 AM - moved to 8:30 - Ginger to notify pt   LEFT HEART CATH AND CORONARY ANGIOGRAPHY N/A 04/14/2020   Procedure: LEFT HEART CATH AND CORONARY ANGIOGRAPHY;  Surgeon: Swaziland, [name], MD;  Location: [facility] INVASIVE CV LAB;  Service: Cardiovascular;  Laterality: N/A;   RIGHT/LEFT HEART CATH AND CORONARY ANGIOGRAPHY N/A 02/19/2021   Procedure: RIGHT/LEFT HEART CATH AND CORONARY ANGIOGRAPHY;  Surgeon: Laurey Morale,  MD;  Location: [facility] INVASIVE CV LAB;  Service: Cardiovascular;  Laterality: N/A;     A IV Location/Drains/Wounds Patient Lines/Drains/Airways Status     Active Line/Drains/Airways     Name Placement date Placement time Site Days   Peripheral IV 08/26/23 18 G Right Antecubital 08/26/23  1338  Antecubital  less than 1   Peripheral IV 08/26/23 22 G Posterior;Right Hand 08/26/23  1414  Hand  less than 1            Intake/Output Last 24 hours No intake or output data in the 24 hours ending 08/26/23 1452  Labs/Imaging Results for orders placed or performed during the hospital encounter of 08/26/23 (from the past 48 hour(s))  Lipase, blood     Status: None   Collection Time: 08/26/23  1:37 PM  Result Value Ref Range   Lipase 23 11 - 51 U/L    Comment: Performed at [facility], [address], [place], [place] [address]  Comprehensive metabolic panel     Status: Abnormal   Collection Time: 08/26/23  1:37 PM  Result Value Ref Range   Sodium 135 135 - 145 mmol/L   Potassium 3.6 3.5 - 5.1 mmol/L   Chloride 97 (L) 98 - 111 mmol/L   CO2 26 22 - 32 mmol/L   Glucose, Bld 144 (H) 70 - 99 mg/dL    Comment: Glucose reference range applies only to samples taken after fasting for at least 8 hours.  BUN 27 (H) 8 - 23 mg/dL   Creatinine, Ser 2.95 (H) 0.44 - 1.00 mg/dL   Calcium 8.3 (L) 8.9 - 10.3 mg/dL   Total Protein 7.4 6.5 - 8.1 g/dL   Albumin 3.4 (L) 3.5 - 5.0 g/dL   AST 27 15 - 41 U/L   ALT 26 0 - 44 U/L   Alkaline Phosphatase 96 38 - 126 U/L   Total Bilirubin 0.9 0.3 - 1.2 mg/dL   GFR, Estimated 46 (L) >60 mL/min    Comment: (NOTE) Calculated using the CKD-EPI Creatinine Equation (2021)    Anion gap 12 5 - 15    Comment: Performed at [facility], [address], [place], [place] [address]  CBC     Status: Abnormal   Collection Time: 08/26/23  1:37 PM  Result Value Ref Range   WBC 11.2 (H) 4.0 - 10.5 K/uL   RBC 3.97 3.87 - 5.11 MIL/uL   Hemoglobin 11.8 (L) 12.0 - 15.0 g/dL    HCT 24.4 01.0 - 27.2 %   MCV 91.7 80.0 - 100.0 fL   MCH 29.7 26.0 - 34.0 pg   MCHC 32.4 30.0 - 36.0 g/dL   RDW 53.6 64.4 - 03.4 %   Platelets 285 150 - 400 K/uL   nRBC 0.3 (H) 0.0 - 0.2 %    Comment: Performed at [facility], [address], [place], [place] [address]   No results found.  Pending Labs Unresulted Labs (From admission, onward)     Start     Ordered   08/26/23 1325  Urinalysis, Routine w reflex microscopic -Urine, Clean Catch  Once,   URGENT       Question:  Specimen Source  Answer:  Urine, Clean Catch   08/26/23 1324            Vitals/Pain Today's Vitals   08/26/23 1414 08/26/23 1415 08/26/23 1430 08/26/23 1445  BP:  114/83 (!) 111/94 129/81  Pulse: 89 (!) 151 95 (!) 121  Resp: (!) 21 (!) 27 (!) 24 (!) 24  Temp:      TempSrc:      SpO2: 92% 93% 94% 96%  Weight:      Height:      PainSc:        Isolation Precautions No active isolations  Medications Medications  amiodarone (NEXTERONE) 1.8 mg/mL load via infusion 150 mg (150 mg Intravenous Bolus from Bag 08/26/23 1354)    Followed by  amiodarone (NEXTERONE PREMIX) 360-4.14 MG/200ML-% (1.8 mg/mL) IV infusion (60 mg/hr Intravenous New Bag/Given 08/26/23 1351)    Followed by  amiodarone (NEXTERONE PREMIX) 360-4.14 MG/200ML-% (1.8 mg/mL) IV infusion (has no administration in time range)  adenosine (ADENOCARD) 6 MG/2ML injection 6 mg (6 mg Intravenous Not Given 08/26/23 1426)    Mobility walks     Focused Assessments Cardiac Assessment Handoff:  Cardiac Rhythm: Supraventricular tachycardia No results found for: "CKTOTAL", "CKMB", "CKMBINDEX", "TROPONINI" No results found for: "DDIMER" Does the Patient currently have chest pain? No    R Recommendations: See Admitting Provider Note  Report given to:   Additional Notes: Pt arrived with Abd pain and she can feel when her heart flutters. Ambulatory , family was at bedside, pleasant. Continent B/B, self ADL

## 2023-08-26 NOTE — Addendum Note (Signed)
Addended by: Freddie Apley R on: 08/26/2023 12:33 PM   Modules accepted: Orders

## 2023-08-26 NOTE — ED Provider Notes (Signed)
Michelle Morrison EMERGENCY DEPARTMENT AT [facility] Provider Note   CSN: [id number] Arrival date & time: 08/26/23  1225     History {Add pertinent medical, surgical, social history, OB history to HPI:1} Chief Complaint  Patient presents with   Abdominal Pain    Michelle Morrison is a 62 y.o. female.  Patient complains of upper abdominal pain and palpitations   Abdominal Pain      Home Medications Prior to Admission medications   Medication Sig Start Date End Date Taking? Authorizing Provider  albuterol (VENTOLIN HFA) 108 (90 Base) MCG/ACT inhaler Inhale 1-2 puffs into the lungs every 4 (four) hours as needed. 05/30/20   [provider]  atorvastatin (LIPITOR) 80 MG tablet Take 1 tablet (80 mg total) by mouth daily. 03/14/21   Antoine Poche, MD  dapagliflozin propanediol (FARXIGA) 10 MG TABS tablet TAKE 1 TABLET (10 MG TOTAL) BY MOUTH DAILY. 03/14/21   Laurey Morale, MD  diclofenac Sodium (VOLTAREN) 1 % GEL Apply topically. 04/22/23   [provider]  dronedarone (MULTAQ) 400 MG tablet TAKE 1 TABLET(400 MG) BY MOUTH TWICE DAILY WITH A MEAL 08/14/23   Camnitz, Andree Coss, MD  hydrALAZINE (APRESOLINE) 50 MG tablet Take 1.5 tablets (75 mg total) by mouth 3 (three) times daily. 01/09/23 01/09/24  Antoine Poche, MD  isosorbide mononitrate (IMDUR) 30 MG 24 hr tablet Take 1 tablet (30 mg total) by mouth daily. 01/09/23 01/04/24  Antoine Poche, MD  sacubitril-valsartan (ENTRESTO) 49-51 MG Take 1 tablet by mouth 2 (two) times daily. 08/19/23   Antoine Poche, MD  TRADJENTA 5 MG TABS tablet Take 5 mg by mouth daily. 08/12/22   [provider]      Allergies    Patient has no known allergies.    Review of Systems   Review of Systems  Gastrointestinal:  Positive for abdominal pain.    Physical Exam Updated Vital Signs BP 129/81   Pulse 86   Temp 98.1 F (36.7 C) (Oral)   Resp 20   Ht 5\' 7"  (1.702 m)   Wt 75.8 kg   LMP 05/23/2007    SpO2 95%   BMI 26.16 kg/m  Physical Exam  ED Results / Procedures / Treatments   Labs (all labs ordered are listed, but only abnormal results are displayed) Labs Reviewed  COMPREHENSIVE METABOLIC PANEL - Abnormal; Notable for the following components:      Result Value   Chloride 97 (*)    Glucose, Bld 144 (*)    BUN 27 (*)    Creatinine, Ser 1.32 (*)    Calcium 8.3 (*)    Albumin 3.4 (*)    GFR, Estimated 46 (*)    All other components within normal limits  CBC - Abnormal; Notable for the following components:   WBC 11.2 (*)    Hemoglobin 11.8 (*)    nRBC 0.3 (*)    All other components within normal limits  MRSA NEXT GEN BY PCR, NASAL  LIPASE, BLOOD  URINALYSIS, ROUTINE W REFLEX MICROSCOPIC  TSH  BRAIN NATRIURETIC PEPTIDE  HIV ANTIBODY (ROUTINE TESTING W REFLEX)  TROPONIN I (HIGH SENSITIVITY)    EKG None  Radiology No results found.  Procedures Procedures  {Document cardiac monitor, telemetry assessment procedure when appropriate:1}  Medications Ordered in ED Medications  amiodarone (NEXTERONE) 1.8 mg/mL load via infusion 150 mg (150 mg Intravenous Bolus from Bag 08/26/23 1354)    Followed by  amiodarone (NEXTERONE PREMIX) 360-4.14 MG/200ML-% (  1.8 mg/mL) IV infusion (60 mg/hr Intravenous New Bag/Given 08/26/23 1351)    Followed by  amiodarone (NEXTERONE PREMIX) 360-4.14 MG/200ML-% (1.8 mg/mL) IV infusion (has no administration in time range)  enoxaparin (LOVENOX) injection 40 mg (has no administration in time range)  acetaminophen (TYLENOL) tablet 650 mg (has no administration in time range)    Or  acetaminophen (TYLENOL) suppository 650 mg (has no administration in time range)  ondansetron (ZOFRAN) tablet 4 mg (has no administration in time range)    Or  ondansetron (ZOFRAN) injection 4 mg (has no administration in time range)  Chlorhexidine Gluconate Cloth 2 % PADS 6 each (has no administration in time range)  adenosine (ADENOCARD) 6 MG/2ML injection 6 mg  (6 mg Intravenous Not Given 08/26/23 1426)    ED Course/ Medical Decision Making/ A&P  CRITICAL CARE Performed by: Bethann Berkshire Total critical care time: 40 minutes Critical care time was exclusive of separately billable procedures and treating other patients. Critical care was necessary to treat or prevent imminent or life-threatening deterioration. Critical care was time spent personally by me on the following activities: development of treatment plan with patient and/or surrogate as well as nursing, discussions with consultants, evaluation of patient's response to treatment, examination of patient, obtaining history from patient or surrogate, ordering and performing treatments and interventions, ordering and review of laboratory studies, ordering and review of radiographic studies, pulse oximetry and re-evaluation of patient's condition.  {   Click here for ABCD2, HEART and other calculatorsREFRESH Note before signing :1}                              Medical Decision Making Amount and/or Complexity of Data Reviewed Labs: ordered.  Risk Prescription drug management. Decision regarding hospitalization.   Patient with SVT.  Patient improved with amiodarone and will be admitted to medicine with cardiology consult  {Document critical care time when appropriate:1} {Document review of labs and clinical decision tools ie heart score, Chads2Vasc2 etc:1}  {Document your independent review of radiology images, and any outside records:1} {Document your discussion with family members, caretakers, and with consultants:1} {Document social determinants of health affecting pt's care:1} {Document your decision making why or why not admission, treatments were needed:1} Final Clinical Impression(s) / ED Diagnoses Final diagnoses:  None    Rx / DC Orders ED Discharge Orders     None

## 2023-08-26 NOTE — Progress Notes (Signed)
Pharmacy Antibiotic Note  Michelle Morrison is a 62 y.o. female admitted on 08/26/2023 with abdominal pain and concern of infection of unknown source.  Pharmacy has been consulted for vancomycin and cefepime dosing.  Plan: {Assessment:21075}  Height: 5\' 7"  (170.2 cm) Weight: 79.6 kg (175 lb 7.8 oz) IBW/kg (Calculated) : 61.6  Temp (24hrs), Avg:97 F (36.1 C), Min:95 F (35 C), Max:98.1 F (36.7 C)  Recent Labs  Lab 08/26/23 1337  WBC 11.2*  CREATININE 1.32*    Estimated Creatinine Clearance: 48.6 mL/min (A) (by C-G formula based on SCr of 1.32 mg/dL (H)).    No Known Allergies  Antimicrobials this admission: Vancomycin 10/1 > Cefepime 10/1 >  Microbiology results: 10/1 BCx: pending 10/1 MRSA nares: negative   Thank you for allowing pharmacy to be a part of this patient's care.  Marja Kays 08/26/2023 10:46 PM

## 2023-08-26 NOTE — Plan of Care (Signed)

## 2023-08-26 NOTE — Assessment & Plan Note (Signed)
-  Patient with prior history of arrhythmia followed by EP and cardiology as an outpatient -Associated systolic heart failure and hypertension -Presenting with abdominal pain and found what appears to be atrial flutter with RVR -In the ED heart rate above 160 -Good response to initiation of amiodarone loading dose and drip. -Cardiology service has been consulted -TRH contacted to place in the hospital for further evaluation and management -Checking CXR (to rule out vascular congestion), BNP, TSH, repeating 2D echo and stabilizing electrolytes -Will for now continue amiodarone and follow cardiology further recommendations.

## 2023-08-26 NOTE — Progress Notes (Signed)
Subjective:     Patient ID: Michelle Morrison, female   DOB: 1960-11-28, 62 y.o.   MRN: [id number]  HPI Michelle Morrison is a 62 year old black female, single, PM in complaining of pain in upper abdomen for 2 days, had nausea and vomiting yesterday, and urine dark. Hurts to walk and radiates into mid back. She had pelvic pain in September and had [place] 08/14/23 at [facility], but it has not been read, called them today.      Component Value Date/Time   DIAGPAP (A) 08/06/2023 1102    - Atypical squamous cells of undetermined significance (ASC-US)   DIAGPAP (A) 04/01/2022 1008    - Atypical squamous cells of undetermined significance (ASC-US)   DIAGPAP - Low grade squamous intraepithelial lesion (LSIL) (A) 02/27/2021 0919   HPVHIGH Positive (A) 08/06/2023 1102   HPVHIGH Positive (A) 04/01/2022 1008   HPVHIGH Positive (A) 02/27/2021 0919   ADEQPAP  08/06/2023 1102    Satisfactory for evaluation; transformation zone component ABSENT.   ADEQPAP  04/01/2022 1008    Satisfactory for evaluation; transformation zone component PRESENT.   ADEQPAP  02/27/2021 0919    Satisfactory for evaluation; transformation zone component ABSENT.  Had colpo 08/11/23 with Dr Despina Hidden no visible dysplasia   PCP is Dr Felecia Shelling   Review of Systems +pain upper abdomen for 2 days, had nausea and vomiting yesterday when tried to eat The pain radiates to back, hurts to walk  Urine dark Reviewed past medical,surgical, social and family history. Reviewed medications and allergies.     Objective:   Physical Exam BP 132/86 (BP Location: Right Arm, Patient Position: Sitting, Cuff Size: Large)   Pulse (!) 165   Ht 5\' 6"  (1.676 m)   Wt 175 lb (79.4 kg)   LMP 05/23/2007   SpO2 97%   BMI 28.25 kg/m  urine dipstick 3+glucose 2+protein Skin warm and dry. Lungs: clear to ausculation bilaterally. Cardiovascular: regular rhythm, elevated heart rate Abdomen is tender RUQ and epigastric area, no rebound, no masses felt but feels tight Pelvic:  external genitalia is normal in appearance no lesions, vagina: pale,urethra has no lesions or masses noted, cervix:smooth, no CMT, uterus: normal size, shape and contour, non tender, no masses felt, adnexa: no masses or tenderness noted. Bladder is non tender and no masses felt. No CVAT noted, has pain mid back with palpation she looks uncomfortable.   Fall risk is low  Upstream - 08/26/23 1130       Pregnancy Intention Screening   Does the patient want to become pregnant in the next year? No    Does the patient's partner want to become pregnant in the next year? No    Would the patient like to discuss contraceptive options today? No      Contraception Wrap Up   Current Method No Method - Other Reason;Abstinence   postmenopausal   End Method Abstinence   PM   Contraception Counseling Provided No            Examination chaperoned by Freddie Apley RN     Assessment:     1. Epigastric pain +tenderness epigastric area, no dominant mass feels tight  Hurts when  walking around   2. RUQ pain +RUQ pain, no rebound  3. Nausea and vomiting, unspecified vomiting type Has vomiting yesterday after eating  4. S/P cholecystectomy  5. Abnormal urine odor No odor noted today  - POCT urinalysis dipstick     Plan:    Go to ER  now to be seen Follow up prn

## 2023-08-26 NOTE — Assessment & Plan Note (Signed)
-  Update A1c -Sliding scale insulin has been ordered -Follow CBG fluctuation.

## 2023-08-26 NOTE — H&P (Signed)
History and Physical    Patient: Michelle Morrison:[id number] DOB: 1961/05/31 DOA: 08/26/2023 DOS: the patient was seen and examined on 08/26/2023 PCP: [name], MD  Patient coming from: Home  Chief Complaint:  Chief Complaint  Patient presents with   Abdominal Pain   HPI: Michelle Morrison is a 63 y.o. female with medical history significant of systolic heart failure, atrial tachycardia, gastroesophageal reflux disease and hypertension; who presented to the hospital with complaints of epigastric pain.  Patient found with a heart rate in the 160 range and concerns atrial flutter with RVR.  Lipase negative patient with prior history of cholecystectomy.  No fever, no elevation of WBCs and no complaints of nausea or vomiting.  LFTs within normal limits. Troponin flat elevation at 38 in the setting of arrhythmia.  Patient reports no chest pain currently.  Review of system demonstrating no overt bleeding, no sick contacts, no shortness of breath, no fever, no palpitations, no focal weaknesses or any other complaints.  In the ED, patient found with atrial flutter with RVR and was started on amiodarone.  Cardiology service consulted and TRH contacted to place patient in the hospital for further evaluation and management.  Review of Systems: As mentioned in the history of present illness. All other systems reviewed and are negative. Past Medical History:  Diagnosis Date   Atrial tachycardia (HCC)    CHF (congestive heart failure) (HCC)    a. EF 45% by echo in 03/2020 with normal cors by cath   Diabetes mellitus without complication (HCC)    Diarrhea    Heart disease    Herpes simplex infection    Hyperlipidemia    Hypertension    Past Surgical History:  Procedure Laterality Date   CESAREAN SECTION     CHOLECYSTECTOMY     COLONOSCOPY N/A 08/26/2014   Procedure: COLONOSCOPY;  Surgeon: West Bali, MD;  [facility];  Service: Endoscopy;  Laterality: N/A;  9:30 AM  - moved to 8:30 - Ginger to notify pt   LEFT HEART CATH AND CORONARY ANGIOGRAPHY N/A 04/14/2020   Procedure: LEFT HEART CATH AND CORONARY ANGIOGRAPHY;  Surgeon: Swaziland, [name], MD;  Location: [facility] INVASIVE CV LAB;  Service: Cardiovascular;  Laterality: N/A;   RIGHT/LEFT HEART CATH AND CORONARY ANGIOGRAPHY N/A 02/19/2021   Procedure: RIGHT/LEFT HEART CATH AND CORONARY ANGIOGRAPHY;  Surgeon: Laurey Morale, MD;  Location: [facility] INVASIVE CV LAB;  Service: Cardiovascular;  Laterality: N/A;   Social History:  reports that she has never smoked. She has never used smokeless tobacco. She reports that she does not drink alcohol and does not use drugs.  No Known Allergies  Family History  Problem Relation Age of Onset   Hypertension Mother    Diabetes Mother    Colon cancer Neg Hx     Prior to Admission medications   Medication Sig Start Date End Date Taking? Authorizing Provider  albuterol (VENTOLIN HFA) 108 (90 Base) MCG/ACT inhaler Inhale 1-2 puffs into the lungs every 4 (four) hours as needed. 05/30/20  Yes [provider]  atorvastatin (LIPITOR) 80 MG tablet Take 1 tablet (80 mg total) by mouth daily. 03/14/21  Yes BranchDorothe Pea, MD  dapagliflozin propanediol (FARXIGA) 10 MG TABS tablet TAKE 1 TABLET (10 MG TOTAL) BY MOUTH DAILY. 03/14/21  Yes Laurey Morale, MD  diclofenac Sodium (VOLTAREN) 1 % GEL Apply 4 g topically daily as needed (stomach pain). 04/22/23  Yes [provider]  dronedarone (MULTAQ) 400  MG tablet TAKE 1 TABLET(400 MG) BY MOUTH TWICE DAILY WITH A MEAL 08/14/23  Yes Camnitz, Andree Coss, MD  hydrALAZINE (APRESOLINE) 50 MG tablet Take 1.5 tablets (75 mg total) by mouth 3 (three) times daily. 01/09/23 01/09/24 Yes BranchDorothe Pea, MD  isosorbide mononitrate (IMDUR) 30 MG 24 hr tablet Take 1 tablet (30 mg total) by mouth daily. 01/09/23 01/04/24 Yes Branch, Dorothe Pea, MD  sacubitril-valsartan (ENTRESTO) 49-51 MG Take 1 tablet by mouth 2 (two) times daily. 08/19/23  Yes  Branch, Dorothe Pea, MD  TRADJENTA 5 MG TABS tablet Take 5 mg by mouth daily. 08/12/22  Yes [provider]    Physical Exam: Vitals:   08/26/23 1555 08/26/23 1600 08/26/23 1613 08/26/23 1800  BP:  (!) 111/49  123/85  Pulse:  93  (!) 114  Resp:  (!) 22  (!) 30  Temp:   97.8 F (36.6 C)   TempSrc:   Oral   SpO2:  99%  99%  Weight: 79.6 kg     Height: 5\' 7"  (1.702 m)      General exam: Alert, awake, oriented x 3; no chest pain, no fever. Respiratory system: Clear to auscultation. Respiratory effort normal.  Good saturation on room air. Cardiovascular system: Soft systolic murmur, no rubs, no gallops, no JVD.  Irregular rate. Gastrointestinal system: Abdomen is nondistended, soft and without guarding.  Complaining of midepigastric and right upper quadrant discomfort.  Positive bowel sounds appreciated. Central nervous system: Alert and oriented. No focal neurological deficits. Extremities: No cyanosis, clubbing or edema. Skin: No petechiae. Psychiatry: Judgement and insight appear normal. Mood & affect appropriate.   Data Reviewed: Lipase: 23 Comprehensive metabolic panel: Sodium 135, potassium 3.6, chloride 97, bicarb 26, BUN 27, creatinine 1.32, albumin 3.4, normal LFTs and GFR 46. CBC: White blood cells 11.2, hemoglobin 11.8 and platelet count 25K.  Assessment and Plan: Atrial flutter (HCC) -Patient with prior history of arrhythmia followed by EP and cardiology as an outpatient -Associated systolic heart failure and hypertension -Presenting with abdominal pain and found what appears to be atrial flutter with RVR -In the ED heart rate above 160 -Good response to initiation of amiodarone loading dose and drip. -Cardiology service has been consulted -TRH contacted to place in the hospital for further evaluation and management -Checking CXR (to rule out vascular congestion), BNP, TSH, repeating 2D echo and stabilizing electrolytes -Will for now continue amiodarone and  follow cardiology further recommendations.  Epigastric pain -Continue the use of PPI twice a day -Continue analgesics -Follow clinical response.  Diabetes mellitus type 2 with complications (HCC) -Update A1c -Sliding scale insulin has been ordered -Follow CBG fluctuation.  Hyperglycemia due to diabetes mellitus (HCC) -Continue sliding scale insulin as mentioned on diabetes problem -Update A1c.  HTN (hypertension), benign -Continue treatment with hydralazine, Imdur and adjusted dose of Entresto -Follow daily weights, strict I's and O's and low-sodium diet.  Chronic systolic CHF (congestive heart failure) (HCC) -Currently appear to be compensated -Checking BNP and chest x-ray to rule out vascular congestion -Good saturation on room air -Follow daily weights, strict I's and O's and low-sodium diet -Continue adjusted dose of Entresto, Imdur and hydralazine as per cardiology service recommendation. -Repeat 2D echo.   Chronic kidney disease -Stage IIIb at baseline -Appears to be associated with underlying diabetes -Continue to follow renal function trend and minimize nephrotoxic agents. -Avoid the use of contrast and avoid hypotension.   Advance Care Planning:   Code Status: Full Code   Consults: Cardiology service  Family Communication: No family at bedside.  Severity of Illness: The appropriate patient status for this patient is OBSERVATION. Observation status is judged to be reasonable and necessary in order to provide the required intensity of service to ensure the patient's safety. The patient's presenting symptoms, physical exam findings, and initial radiographic and laboratory data in the context of their medical condition is felt to place them at decreased risk for further clinical deterioration. Furthermore, it is anticipated that the patient will be medically stable for discharge from the hospital within 2 midnights of admission.   Author: Vassie Loll, MD 08/26/2023  6:45 PM  For on call review [email or web address].

## 2023-08-26 NOTE — ED Notes (Signed)
ED TO INPATIENT HANDOFF REPORT  ED Nurse Name and Phone #: Jennette Kettle [phone]  S Name/Age/Gender Michelle Morrison 62 y.o. female Room/Bed: APA08/APA08  Code Status   Code Status: Full Code  Home/SNF/Other Home Patient oriented to: self, place, time, and situation Is this baseline? Yes   Triage Complete: Triage complete  Chief Complaint Atrial flutter [facility]) [I48.92]  Triage Note Pt sent from UC for r/o Cholecystectomy, pt HR-165 in triage, pain started on Sunday.    Allergies No Known Allergies  Level of Care/Admitting Diagnosis ED Disposition     ED Disposition  Admit   Condition  --   Comment  Hospital Area: [facility] [[id number]]  Level of Care: Stepdown [14]  Covid Evaluation: Asymptomatic - no recent exposure (last 10 days) testing not required  Diagnosis: Atrial flutter (HCC) [427.32.ICD-9-CM]  Admitting Physician: Vassie Loll [[id number]]  Attending Physician: Vassie Loll [[id number]]          B Medical/Surgery History Past Medical History:  Diagnosis Date   Atrial tachycardia (HCC)    CHF (congestive heart failure) (HCC)    a. EF 45% by echo in 03/2020 with normal cors by cath   Diabetes mellitus without complication (HCC)    Diarrhea    Heart disease    Herpes simplex infection    Hyperlipidemia    Hypertension    Past Surgical History:  Procedure Laterality Date   CESAREAN SECTION     CHOLECYSTECTOMY     COLONOSCOPY N/A 08/26/2014   Procedure: COLONOSCOPY;  Surgeon: West Bali, MD;  [facility];  Service: Endoscopy;  Laterality: N/A;  9:30 AM - moved to 8:30 - Ginger to notify pt   LEFT HEART CATH AND CORONARY ANGIOGRAPHY N/A 04/14/2020   Procedure: LEFT HEART CATH AND CORONARY ANGIOGRAPHY;  Surgeon: Swaziland, [name], MD;  Location: [facility] INVASIVE CV LAB;  Service: Cardiovascular;  Laterality: N/A;   RIGHT/LEFT HEART CATH AND CORONARY ANGIOGRAPHY N/A 02/19/2021   Procedure: RIGHT/LEFT HEART CATH AND CORONARY ANGIOGRAPHY;  Surgeon:  Laurey Morale, MD;  Location: [facility] INVASIVE CV LAB;  Service: Cardiovascular;  Laterality: N/A;     A IV Location/Drains/Wounds Patient Lines/Drains/Airways Status     Active Line/Drains/Airways     Name Placement date Placement time Site Days   Peripheral IV 08/26/23 18 G Right Antecubital 08/26/23  1338  Antecubital  less than 1   Peripheral IV 08/26/23 22 G Posterior;Right Hand 08/26/23  1414  Hand  less than 1            Intake/Output Last 24 hours No intake or output data in the 24 hours ending 08/26/23 1506  Labs/Imaging Results for orders placed or performed during the hospital encounter of 08/26/23 (from the past 48 hour(s))  Lipase, blood     Status: None   Collection Time: 08/26/23  1:37 PM  Result Value Ref Range   Lipase 23 11 - 51 U/L    Comment: Performed at [facility], [address], [place], [place] [address]  Comprehensive metabolic panel     Status: Abnormal   Collection Time: 08/26/23  1:37 PM  Result Value Ref Range   Sodium 135 135 - 145 mmol/L   Potassium 3.6 3.5 - 5.1 mmol/L   Chloride 97 (L) 98 - 111 mmol/L   CO2 26 22 - 32 mmol/L   Glucose, Bld 144 (H) 70 - 99 mg/dL    Comment: Glucose reference range applies only to samples taken after fasting for  at least 8 hours.   BUN 27 (H) 8 - 23 mg/dL   Creatinine, Ser 0.10 (H) 0.44 - 1.00 mg/dL   Calcium 8.3 (L) 8.9 - 10.3 mg/dL   Total Protein 7.4 6.5 - 8.1 g/dL   Albumin 3.4 (L) 3.5 - 5.0 g/dL   AST 27 15 - 41 U/L   ALT 26 0 - 44 U/L   Alkaline Phosphatase 96 38 - 126 U/L   Total Bilirubin 0.9 0.3 - 1.2 mg/dL   GFR, Estimated 46 (L) >60 mL/min    Comment: (NOTE) Calculated using the CKD-EPI Creatinine Equation (2021)    Anion gap 12 5 - 15    Comment: Performed at [facility], [address], [place], [place] [address]  CBC     Status: Abnormal   Collection Time: 08/26/23  1:37 PM  Result Value Ref Range   WBC 11.2 (H) 4.0 - 10.5 K/uL   RBC 3.97 3.87 - 5.11 MIL/uL   Hemoglobin 11.8 (L)  12.0 - 15.0 g/dL   HCT 66.4 40.3 - 47.4 %   MCV 91.7 80.0 - 100.0 fL   MCH 29.7 26.0 - 34.0 pg   MCHC 32.4 30.0 - 36.0 g/dL   RDW 25.9 56.3 - 87.5 %   Platelets 285 150 - 400 K/uL   nRBC 0.3 (H) 0.0 - 0.2 %    Comment: Performed at [facility], [address], [place], [place] [address]   No results found.  Pending Labs Unresulted Labs (From admission, onward)     Start     Ordered   09/02/23 0500  Creatinine, serum  (enoxaparin (LOVENOX)    CrCl >/= 30 ml/min)  Weekly,   R     Comments: while on enoxaparin therapy    08/26/23 1457   08/27/23 0500  Basic metabolic panel  Tomorrow morning,   R        08/26/23 1457   08/27/23 0500  CBC  Tomorrow morning,   R        08/26/23 1457   08/26/23 1455  HIV Antibody (routine testing w rflx)  (HIV Antibody (Routine testing w reflex) panel)  Once,   R        08/26/23 1457   08/26/23 1455  CBC  (enoxaparin (LOVENOX)    CrCl >/= 30 ml/min)  Once,   R       Comments: Baseline for enoxaparin therapy IF NOT ALREADY DRAWN.  Notify MD if PLT < 100 K.    08/26/23 1457   08/26/23 1455  Creatinine, serum  (enoxaparin (LOVENOX)    CrCl >/= 30 ml/min)  Once,   R       Comments: Baseline for enoxaparin therapy IF NOT ALREADY DRAWN.    08/26/23 1457   08/26/23 1452  TSH  Once,   R        08/26/23 1453   08/26/23 1452  Brain natriuretic peptide  Once,   R        08/26/23 1453   08/26/23 1325  Urinalysis, Routine w reflex microscopic -Urine, Clean Catch  Once,   URGENT       Question:  Specimen Source  Answer:  Urine, Clean Catch   08/26/23 1324            Vitals/Pain Today's Vitals   08/26/23 1414 08/26/23 1415 08/26/23 1430 08/26/23 1445  BP:  114/83 (!) 111/94 129/81  Pulse: 89 (!) 151 95 (!) 121  Resp: (!) 21 (!) 27 Marland Kitchen)  24 (!) 24  Temp:      TempSrc:      SpO2: 92% 93% 94% 96%  Weight:      Height:      PainSc:        Isolation Precautions No active isolations  Medications Medications  amiodarone (NEXTERONE) 1.8 mg/mL load  via infusion 150 mg (150 mg Intravenous Bolus from Bag 08/26/23 1354)    Followed by  amiodarone (NEXTERONE PREMIX) 360-4.14 MG/200ML-% (1.8 mg/mL) IV infusion (60 mg/hr Intravenous New Bag/Given 08/26/23 1351)    Followed by  amiodarone (NEXTERONE PREMIX) 360-4.14 MG/200ML-% (1.8 mg/mL) IV infusion (has no administration in time range)  enoxaparin (LOVENOX) injection 40 mg (has no administration in time range)  acetaminophen (TYLENOL) tablet 650 mg (has no administration in time range)    Or  acetaminophen (TYLENOL) suppository 650 mg (has no administration in time range)  ondansetron (ZOFRAN) tablet 4 mg (has no administration in time range)    Or  ondansetron (ZOFRAN) injection 4 mg (has no administration in time range)  adenosine (ADENOCARD) 6 MG/2ML injection 6 mg (6 mg Intravenous Not Given 08/26/23 1426)    Mobility walks     Focused Assessments Cardiac Assessment Handoff:  Cardiac Rhythm: Supraventricular tachycardia No results found for: "CKTOTAL", "CKMB", "CKMBINDEX", "TROPONINI" No results found for: "DDIMER" Does the Patient currently have chest pain? No    R Recommendations: See Admitting Provider Note  Report given to:   Additional Notes: HR will jump from 108-180 within seconds, no complaints of CP.

## 2023-08-26 NOTE — Consult Note (Signed)
Cardiology Consultation:   Patient ID: Michelle Morrison; [id number]; 10/09/1961   Admit date: 08/26/2023 Date of Consult: 08/26/2023  Primary Care Provider: [name], MD Primary Cardiologist: Dina Rich, MD  History of Present Illness:   Michelle Morrison is a 62 y.o. female with past medical history outlined below presenting to the ER in referral from OB/GYN clinic for evaluation of abdominal discomfort.  It looks like she was noted to have an elevated heart rate in the 160s on assessment as well.  She does not specifically report any sense of palpitations, but is found to be in recurring/persistent SVT, most likely atrial tachycardia with IVCD of incomplete left bundle branch block type as before.  She is followed by Dr. Wyline Mood and Dr. Elberta Fortis, on Multaq (previously amiodarone) and reportedly doing reasonably well as of her most recent office visit in mid September. She wore a cardiac monitor in June with results as follows:  Predominant rhythm was sinus rhythm Min HR of 33 bpm, max HR of 187 bpm, and avg HR of 55 bpm 3 VT episodes, all less than 10 beats 51 SVT episodes fastest 187 bpm for 10 seconds, longest 35 seconds at 113 bpm Less than 1% ventricular and supraventricular ectopy Patient triggered episodes associated with sinus rhythm  Only recent medication change is up titration of Entresto.  She reports compliance with therapy.  ROS:  Pertinent review in history of present illness.  No definite sense of palpitations, no chest pain or syncope.  Past Medical History:  Diagnosis Date   Atrial tachycardia (HCC)    CHF (congestive heart failure) (HCC)    a. EF 45% by echo in 03/2020 with normal cors by cath   Diabetes mellitus without complication (HCC)    Diarrhea    Heart disease    Herpes simplex infection    Hyperlipidemia    Hypertension     Past Surgical History:  Procedure Laterality Date   CESAREAN SECTION     CHOLECYSTECTOMY     COLONOSCOPY N/A  08/26/2014   Procedure: COLONOSCOPY;  Surgeon: West Bali, MD;  Location: [facility];  Service: Endoscopy;  Laterality: N/A;  9:30 AM - moved to 8:30 - Ginger to notify pt   LEFT HEART CATH AND CORONARY ANGIOGRAPHY N/A 04/14/2020   Procedure: LEFT HEART CATH AND CORONARY ANGIOGRAPHY;  Surgeon: Swazil[place], [name], MD;  Location: [facility] INVASIVE CV LAB;  Service: Cardiovascular;  Laterality: N/A;   RIGHT/LEFT HEART CATH AND CORONARY ANGIOGRAPHY N/A 02/19/2021   Procedure: RIGHT/LEFT HEART CATH AND CORONARY ANGIOGRAPHY;  Surgeon: Laurey Morale, MD;  Location: [facility] INVASIVE CV LAB;  Service: Cardiovascular;  Laterality: N/A;     Outpatient Medications: No current facility-administered medications on file prior to encounter.   Current Outpatient Medications on File Prior to Encounter  Medication Sig Dispense Refill   albuterol (VENTOLIN HFA) 108 (90 Base) MCG/ACT inhaler Inhale 1-2 puffs into the lungs every 4 (four) hours as needed.     atorvastatin (LIPITOR) 80 MG tablet Take 1 tablet (80 mg total) by mouth daily. 90 tablet 3   dapagliflozin propanediol (FARXIGA) 10 MG TABS tablet TAKE 1 TABLET (10 MG TOTAL) BY MOUTH DAILY. 30 tablet 6   diclofenac Sodium (VOLTAREN) 1 % GEL Apply topically.     dronedarone (MULTAQ) 400 MG tablet TAKE 1 TABLET(400 MG) BY MOUTH TWICE DAILY WITH A MEAL 180 tablet 2   hydrALAZINE (APRESOLINE) 50 MG tablet Take 1.5 tablets (75 mg total) by mouth 3 (  three) times daily. 405 tablet 3   isosorbide mononitrate (IMDUR) 30 MG 24 hr tablet Take 1 tablet (30 mg total) by mouth daily. 90 tablet 3   sacubitril-valsartan (ENTRESTO) 49-51 MG Take 1 tablet by mouth 2 (two) times daily. 60 tablet 11   TRADJENTA 5 MG TABS tablet Take 5 mg by mouth daily.       Allergies:   No Known Allergies  Social History:   Social History   Tobacco Use   Smoking status: Never   Smokeless tobacco: Never  Substance Use Topics   Alcohol use: No    Alcohol/week: 0.0 standard drinks of alcohol     Family History:   The patient's family history includes Diabetes in her mother; Hypertension in her mother. There is no history of Colon cancer.  Physical Exam/Data:   Vitals:   08/26/23 1414 08/26/23 1415 08/26/23 1430 08/26/23 1445  BP:  114/83 (!) 111/94 129/81  Pulse: 89 (!) 151 95 (!) 121  Resp: (!) 21 (!) 27 (!) 24 (!) 24  Temp:      TempSrc:      SpO2: 92% 93% 94% 96%  Weight:      Height:       No intake or output data in the 24 hours ending 08/26/23 1453 Filed Weights   08/26/23 1320  Weight: 75.8 kg   Body mass index is 26.16 kg/m.   Gen: Patient appears comfortable at rest. HEENT: Conjunctiva and lids normal, oropharynx clear. Neck: Supple, no elevated JVP or carotid bruits. Lungs: Clear to auscultation, nonlabored breathing at rest. Cardiac: Regular rate and rhythm with recurrent rapid regular arrhythmia, 1/6 systolic murmur, no pericardial rub. Abdomen: Soft, bowel sounds present, mildly tender. Extremities: No pitting edema, distal pulses 2+. Skin: Warm and dry. Musculoskeletal: No kyphosis. Neuropsychiatric: Alert and oriented x3, affect grossly appropriate.  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  Telemetry:  I personally reviewed telemetry which shows sinus rhythm with bursts of atrial tachycardia.  Relevant CV Studies:  Echocardiogram 04/14/2023:  1. Left ventricular ejection fraction, by estimation, is 45%. The left  ventricle has mildly decreased function. The left ventricle demonstrates  regional wall motion abnormalities (see scoring diagram/findings for  description). There is mild left  ventricular hypertrophy. Left ventricular diastolic parameters are  consistent with Grade III diastolic dysfunction (restrictive). Elevated  left atrial pressure.   2. Right ventricular systolic function  is normal. The right ventricular  size is normal. There is normal pulmonary artery systolic pressure.   3. Left atrial size was moderately dilated.   4. The mitral valve is normal in structure. Trivial mitral valve  regurgitation. No evidence of mitral stenosis.   5. The tricuspid valve is abnormal.   6. The aortic valve is tricuspid. Aortic valve regurgitation is not  visualized. No aortic stenosis is present.   7. The inferior vena cava is normal in size with greater than 50%  respiratory variability, suggesting right atrial pressure of 3 mmHg.   Laboratory Data:  Chemistry Recent Labs  Lab 08/26/23 1337  NA 135  K 3.6  CL 97*  CO2 26  GLUCOSE 144*  BUN 27*  CREATININE 1.32*  CALCIUM 8.3*  GFRNONAA 46*  ANIONGAP 12    Recent Labs  Lab 08/26/23 1337  PROT 7.4  ALBUMIN 3.4*  AST 27  ALT 26  ALKPHOS 96  BILITOT 0.9   Hematology Recent Labs  Lab 08/26/23 1337  WBC 11.2*  RBC 3.97  HGB 11.8*  HCT 36.4  MCV 91.7  MCH 29.7  MCHC 32.4  RDW 14.4  PLT 285    Lipid Panel     Component Value Date/Time   CHOL 181 08/07/2023 0928   TRIG 112 08/07/2023 0928   HDL 64 08/07/2023 0928   CHOLHDL 2.8 08/07/2023 0928   VLDL 22 08/07/2023 0928   LDLCALC 95 08/07/2023 0928    Radiology/Studies:  Pending.  Assessment and Plan:   1.  Recurring, persistent atrial tachycardia with IVCD.  She actually presented with abdominal discomfort and no direct sense of palpitations.  At baseline on Multaq and follows with the EP.  Cardiac monitor in June demonstrated multiple episodes of relatively brief PSVT, the longest of which was 35 seconds.  Not entirely clear about trigger for present exacerbation, but concern is that she is having more arrhythmia than is necessarily symptomatic from the perspective of palpitations and may need to be considered for adjustments in her antiarrhythmic therapy versus possibility of ablation if feasible.  2.  HFmrEF with nonischemic  cardiomyopathy, LVEF 45% by echocardiogram in May 2024.  Possibly tachycardia induced.  3.  Normal coronary arteries by cardiac catheterization in 2021.  4.  CKD stage IIIa-b, current creatinine 1.32 with GFR 46.  5.  Essential hypertension.  The patient was started on IV amiodarone with bolus in the ER, would continue infusion and stop Multaq for now.  Continue Farxiga, hydralazine, Imdur, and reduce Entresto to 24/26 mg twice daily while tracking blood pressure.  She is not on MRA due to prior history of hyperkalemia.  She is also not on beta-blocker with prior symptomatic bradycardia.  Once rhythm further stabilizes, would get a follow-up echocardiogram to ensure no further decline in LVEF.  Question now will be whether she needs to stay on amiodarone going forward or consider possibility of whether radiofrequency ablation is feasible, particularly if she has recurrent cardiomyopathy that could be tachycardia induced.  For questions or updates, please contact [facility] Please consult [email or web address] for contact info under   Signed, Nona Dell, MD  08/26/2023 2:53 PM

## 2023-08-26 NOTE — Assessment & Plan Note (Signed)
-  Continue sliding scale insulin as mentioned on diabetes problem -Update A1c.

## 2023-08-26 NOTE — Assessment & Plan Note (Signed)
-  Continue treatment with hydralazine, Imdur and adjusted dose of Entresto -Follow daily weights, strict I's and O's and low-sodium diet.

## 2023-08-26 NOTE — Assessment & Plan Note (Signed)
-  Currently appear to be compensated -Checking BNP and chest x-ray to rule out vascular congestion -Good saturation on room air -Follow daily weights, strict I's and O's and low-sodium diet -Continue adjusted dose of Entresto, Imdur and hydralazine as per cardiology service recommendation. -Repeat 2D echo.

## 2023-08-26 NOTE — Assessment & Plan Note (Signed)
-  Continue the use of PPI twice a day -Continue analgesics -Follow clinical response.

## 2023-08-27 ENCOUNTER — Observation Stay (HOSPITAL_COMMUNITY): Payer: 59

## 2023-08-27 ENCOUNTER — Other Ambulatory Visit (HOSPITAL_COMMUNITY): Payer: Self-pay | Admitting: *Deleted

## 2023-08-27 ENCOUNTER — Encounter (HOSPITAL_COMMUNITY): Payer: Self-pay | Admitting: Radiology

## 2023-08-27 DIAGNOSIS — K219 Gastro-esophageal reflux disease without esophagitis: Secondary | ICD-10-CM | POA: Diagnosis present

## 2023-08-27 DIAGNOSIS — I4719 Other supraventricular tachycardia: Secondary | ICD-10-CM

## 2023-08-27 DIAGNOSIS — Z8619 Personal history of other infectious and parasitic diseases: Secondary | ICD-10-CM | POA: Diagnosis not present

## 2023-08-27 DIAGNOSIS — Z7984 Long term (current) use of oral hypoglycemic drugs: Secondary | ICD-10-CM | POA: Diagnosis not present

## 2023-08-27 DIAGNOSIS — I4892 Unspecified atrial flutter: Secondary | ICD-10-CM | POA: Diagnosis present

## 2023-08-27 DIAGNOSIS — R1013 Epigastric pain: Secondary | ICD-10-CM | POA: Diagnosis present

## 2023-08-27 DIAGNOSIS — E1122 Type 2 diabetes mellitus with diabetic chronic kidney disease: Secondary | ICD-10-CM | POA: Diagnosis present

## 2023-08-27 DIAGNOSIS — I5022 Chronic systolic (congestive) heart failure: Secondary | ICD-10-CM | POA: Diagnosis present

## 2023-08-27 DIAGNOSIS — K5730 Diverticulosis of large intestine without perforation or abscess without bleeding: Secondary | ICD-10-CM | POA: Diagnosis not present

## 2023-08-27 DIAGNOSIS — N1831 Chronic kidney disease, stage 3a: Secondary | ICD-10-CM | POA: Diagnosis present

## 2023-08-27 DIAGNOSIS — E1165 Type 2 diabetes mellitus with hyperglycemia: Secondary | ICD-10-CM | POA: Diagnosis present

## 2023-08-27 DIAGNOSIS — I428 Other cardiomyopathies: Secondary | ICD-10-CM | POA: Diagnosis present

## 2023-08-27 DIAGNOSIS — Z79899 Other long term (current) drug therapy: Secondary | ICD-10-CM | POA: Diagnosis not present

## 2023-08-27 DIAGNOSIS — E785 Hyperlipidemia, unspecified: Secondary | ICD-10-CM | POA: Diagnosis present

## 2023-08-27 DIAGNOSIS — Z833 Family history of diabetes mellitus: Secondary | ICD-10-CM | POA: Diagnosis not present

## 2023-08-27 DIAGNOSIS — I420 Dilated cardiomyopathy: Secondary | ICD-10-CM | POA: Diagnosis not present

## 2023-08-27 DIAGNOSIS — Z8249 Family history of ischemic heart disease and other diseases of the circulatory system: Secondary | ICD-10-CM | POA: Diagnosis not present

## 2023-08-27 DIAGNOSIS — I130 Hypertensive heart and chronic kidney disease with heart failure and stage 1 through stage 4 chronic kidney disease, or unspecified chronic kidney disease: Secondary | ICD-10-CM | POA: Diagnosis present

## 2023-08-27 DIAGNOSIS — I447 Left bundle-branch block, unspecified: Secondary | ICD-10-CM | POA: Diagnosis present

## 2023-08-27 DIAGNOSIS — R011 Cardiac murmur, unspecified: Secondary | ICD-10-CM | POA: Diagnosis present

## 2023-08-27 LAB — BASIC METABOLIC PANEL
Anion gap: 10 (ref 5–15)
BUN: 29 mg/dL — ABNORMAL HIGH (ref 8–23)
CO2: 26 mmol/L (ref 22–32)
Calcium: 8.2 mg/dL — ABNORMAL LOW (ref 8.9–10.3)
Chloride: 99 mmol/L (ref 98–111)
Creatinine, Ser: 1.43 mg/dL — ABNORMAL HIGH (ref 0.44–1.00)
GFR, Estimated: 42 mL/min — ABNORMAL LOW (ref >60)
Glucose, Bld: 134 mg/dL — ABNORMAL HIGH (ref 70–99)
Potassium: 4.3 mmol/L (ref 3.5–5.1)
Sodium: 135 mmol/L (ref 135–145)

## 2023-08-27 LAB — URINALYSIS, ROUTINE W REFLEX MICROSCOPIC
Bilirubin Urine: NEGATIVE
Glucose, UA: >=500 mg/dL — AB
Hgb urine dipstick: NEGATIVE
Ketones, ur: NEGATIVE mg/dL
Leukocytes,Ua: NEGATIVE
Nitrite: NEGATIVE
Protein, ur: 100 mg/dL — AB
Specific Gravity, Urine: 1.033 — ABNORMAL HIGH (ref 1.005–1.030)
pH: 5 (ref 5.0–8.0)

## 2023-08-27 LAB — HEMOGLOBIN A1C
Hgb A1c MFr Bld: 6.5 % — ABNORMAL HIGH (ref 4.8–5.6)
Mean Plasma Glucose: 139.85 mg/dL

## 2023-08-27 LAB — ECHOCARDIOGRAM COMPLETE
Area-P 1/2: 7.66 cm2
Calc EF: 26 %
Height: 67 in
S' Lateral: 4.1 cm
Single Plane A2C EF: 28 %
Single Plane A4C EF: 24.7 %
Weight: 2793.67 [oz_av]

## 2023-08-27 LAB — CBC
HCT: 31.4 % — ABNORMAL LOW (ref 36.0–46.0)
Hemoglobin: 10.4 g/dL — ABNORMAL LOW (ref 12.0–15.0)
MCH: 30.1 pg (ref 26.0–34.0)
MCHC: 33.1 g/dL (ref 30.0–36.0)
MCV: 91 fL (ref 80.0–100.0)
Platelets: 258 10*3/uL (ref 150–400)
RBC: 3.45 MIL/uL — ABNORMAL LOW (ref 3.87–5.11)
RDW: 14.5 % (ref 11.5–15.5)
WBC: 8.7 10*3/uL (ref 4.0–10.5)
nRBC: 0.2 % (ref 0.0–0.2)

## 2023-08-27 LAB — GLUCOSE, CAPILLARY
Glucose-Capillary: 192 mg/dL — ABNORMAL HIGH (ref 70–99)
Glucose-Capillary: 107 mg/dL — ABNORMAL HIGH (ref 70–99)
Glucose-Capillary: 194 mg/dL — ABNORMAL HIGH (ref 70–99)
Glucose-Capillary: 212 mg/dL — ABNORMAL HIGH (ref 70–99)
Glucose-Capillary: 89 mg/dL (ref 70–99)

## 2023-08-27 LAB — HIV ANTIBODY (ROUTINE TESTING W REFLEX): HIV Screen 4th Generation wRfx: NONREACTIVE

## 2023-08-27 MED ORDER — VANCOMYCIN HCL IN DEXTROSE 1-5 GM/200ML-% IV SOLN: 1000.0000 mg | INTRAVENOUS | Status: DC

## 2023-08-27 MED ORDER — ONDANSETRON HCL 4 MG/2ML IJ SOLN
4.0000 mg | Freq: Four times a day (QID) | INTRAMUSCULAR | Status: DC | PRN
  Administered 2023-08-27: 4 mg via INTRAVENOUS
  Filled 2023-08-27: qty 2

## 2023-08-27 MED ORDER — LACTATED RINGERS IV SOLN: INTRAVENOUS | Status: DC

## 2023-08-27 MED ORDER — PANTOPRAZOLE SODIUM 40 MG IV SOLR
40.0000 mg | Freq: Two times a day (BID) | INTRAVENOUS | Status: DC
  Administered 2023-08-27 – 2023-08-28 (×3): 40 mg via INTRAVENOUS
  Filled 2023-08-27 (×3): qty 10

## 2023-08-27 MED ORDER — LACTATED RINGERS IV SOLN
INTRAVENOUS | Status: DC
Start: 2023-08-27 — End: 2023-08-27

## 2023-08-27 MED ORDER — LIDOCAINE 5 % EX PTCH
1.0000 | MEDICATED_PATCH | Freq: Once | CUTANEOUS | Status: AC
  Administered 2023-08-27: 1 via TRANSDERMAL
  Filled 2023-08-27 (×2): qty 1

## 2023-08-27 MED ORDER — PROCHLORPERAZINE EDISYLATE 10 MG/2ML IJ SOLN
10.0000 mg | Freq: Four times a day (QID) | INTRAMUSCULAR | Status: DC | PRN
  Administered 2023-08-27: 10 mg via INTRAVENOUS
  Filled 2023-08-27: qty 2

## 2023-08-27 MED ORDER — IOHEXOL 300 MG/ML  SOLN
80.0000 mL | Freq: Once | INTRAMUSCULAR | Status: AC | PRN
  Administered 2023-08-27: 80 mL via INTRAVENOUS

## 2023-08-27 MED ORDER — PERFLUTREN LIPID MICROSPHERE
1.0000 mL | INTRAVENOUS | Status: AC | PRN
  Administered 2023-08-27: 3 mL via INTRAVENOUS

## 2023-08-27 MED ORDER — IOHEXOL 9 MG/ML PO SOLN
ORAL | Status: AC
  Filled 2023-08-27: qty 500

## 2023-08-27 MED ORDER — LACTATED RINGERS IV SOLN: INTRAVENOUS | Status: AC

## 2023-08-27 MED ORDER — SODIUM CHLORIDE 0.9 % IV SOLN: 2.0000 g | Freq: Two times a day (BID) | INTRAVENOUS | Status: DC

## 2023-08-27 NOTE — Progress Notes (Signed)
Pt complained of nausea and vomiting this morning. Zofran given and is now able to keep food and medications down.

## 2023-08-27 NOTE — Progress Notes (Signed)
PROGRESS NOTE    Michelle Morrison  HYQ:[id number] DOB: October 06, 1961 DOA: 08/26/2023 PCP: [name], MD   Brief Narrative:    Michelle Morrison is a 62 y.o. female with medical history significant of systolic heart failure, atrial tachycardia, gastroesophageal reflux disease and hypertension; who presented to the hospital with complaints of epigastric pain.  Patient found with a heart rate in the 160 range and concerns atrial flutter with RVR.  Patient started on amiodarone and cardiology following.  She is now noted to have GI symptoms requiring further workup with imaging.  Assessment & Plan:   Principal Problem:   Atrial flutter (HCC) Active Problems:   Diabetes mellitus type 2 with complications (HCC)   HTN (hypertension), benign   Hyperglycemia due to diabetes mellitus (HCC)   Epigastric pain   Chronic systolic CHF (congestive heart failure) (HCC)  Assessment and Plan:  Atrial flutter (HCC) -Continue amiodarone infusion -Cardiology service has been consulted -Chest x-ray negative, BNP 2159, TSH 2.89, repeating 2D echo and stabilizing electrolytes -Will for now continue amiodarone and follow cardiology further recommendations.   Epigastric pain/nausea/vomiting -Continue the use of PPI twice a day -Continue analgesics -CT abdomen pelvis ordered and pending -Gentle IV fluid, time-limited and monitor carefully   Diabetes mellitus type 2 with complications (HCC) -A1c 6.5% -Sliding scale insulin has been ordered -Follow CBG fluctuation.   Hyperglycemia due to diabetes mellitus (HCC) -Continue sliding scale insulin as mentioned on diabetes problem   HTN (hypertension), benign -Continue treatment with hydralazine, Imdur and adjusted dose of Entresto -Follow daily weights, strict I's and O's and low-sodium diet.   Chronic systolic CHF (congestive heart failure) (HCC) -Currently appear to be compensated -Good saturation on room air -Follow daily weights, strict I's  and O's and low-sodium diet -Continue adjusted dose of Entresto, Imdur and hydralazine as per cardiology service recommendation. -Repeat 2D echo.    Chronic kidney disease -Stage IIIb at baseline -Appears to be associated with underlying diabetes -Continue to follow renal function trend and minimize nephrotoxic agents. -Avoid the use of contrast and avoid hypotension.    DVT prophylaxis:Lovenox Code Status: Full Family Communication: Daughter at bedside 10/2 Disposition Plan:  Status is: Observation The patient will require care spanning > 2 midnights and should be moved to inpatient because: Need for IV fluid.   Consultants:  Cardiology  Procedures:  None  Antimicrobials:  Anti-infectives (From admission, onward)    Start     Dose/Rate Route Frequency Ordered Stop   08/27/23 2300  vancomycin (VANCOCIN) IVPB 1000 mg/200 mL premix        1,000 mg 200 mL/hr over 60 Minutes Intravenous Every 24 hours 08/27/23 0105     08/27/23 1000  ceFEPIme (MAXIPIME) 2 g in sodium chloride 0.9 % 100 mL IVPB        2 g 200 mL/hr over 30 Minutes Intravenous Every 12 hours 08/27/23 0050     08/26/23 2300  ceFEPIme (MAXIPIME) 2 g in sodium chloride 0.9 % 100 mL IVPB        2 g 200 mL/hr over 30 Minutes Intravenous  Once 08/26/23 2205 08/26/23 2316   08/26/23 2300  metroNIDAZOLE (FLAGYL) IVPB 500 mg        500 mg 100 mL/hr over 60 Minutes Intravenous Every 12 hours 08/26/23 2205 09/02/23 2259   08/26/23 2300  vancomycin (VANCOCIN) IVPB 1000 mg/200 mL premix        1,000 mg 200 mL/hr over 60 Minutes Intravenous  Once 08/26/23 2205  08/27/23 0202      Subjective: Patient seen and evaluated today with complaints of some abdominal pain with nausea and vomiting.  Heart rates appear stable on amiodarone infusion.  Noted to have some hypothermia with temperature around 95 F overnight.  Objective: Vitals:   08/27/23 0400 08/27/23 0435 08/27/23 0500 08/27/23 0600  BP: (!) 127/95  (!) 134/91 (!)  135/94  Pulse: (!) 105  (!) 103 (!) 105  Resp: 16  16 19   Temp:  98.1 F (36.7 C)  97.6 F (36.4 C)  TempSrc:  Oral  Oral  SpO2: 100%  100% 100%  Weight:    79.2 kg  Height:        Intake/Output Summary (Last 24 hours) at 08/27/2023 0727 Last data filed at 08/27/2023 0600 Gross per 24 hour  Intake 1158.19 ml  Output --  Net 1158.19 ml   Filed Weights   08/26/23 1320 08/26/23 1555 08/27/23 0600  Weight: 75.8 kg 79.6 kg 79.2 kg    Examination:  General exam: Appears calm and comfortable  Respiratory system: Clear to auscultation. Respiratory effort normal. Cardiovascular system: S1 & S2 heard, RRR.  Gastrointestinal system: Abdomen is soft Central nervous system: Alert and awake Extremities: No edema Skin: No significant lesions noted Psychiatry: Flat affect.    Data Reviewed: I have personally reviewed following labs and imaging studies  CBC: Recent Labs  Lab 08/26/23 1337 08/27/23 0521  WBC 11.2* 8.7  HGB 11.8* 10.4*  HCT 36.4 31.4*  MCV 91.7 91.0  PLT 285 258   Basic Metabolic Panel: Recent Labs  Lab 08/26/23 1337 08/27/23 0521  NA 135 135  K 3.6 4.3  CL 97* 99  CO2 26 26  GLUCOSE 144* 134*  BUN 27* 29*  CREATININE 1.32* 1.43*  CALCIUM 8.3* 8.2*   GFR: Estimated Creatinine Clearance: 44.7 mL/min (A) (by C-G formula based on SCr of 1.43 mg/dL (H)). Liver Function Tests: Recent Labs  Lab 08/26/23 1337  AST 27  ALT 26  ALKPHOS 96  BILITOT 0.9  PROT 7.4  ALBUMIN 3.4*   Recent Labs  Lab 08/26/23 1337  LIPASE 23   No results for input(s): "AMMONIA" in the last 168 hours. Coagulation Profile: Recent Labs  Lab 08/26/23 2229  INR 1.2   Cardiac Enzymes: No results for input(s): "CKTOTAL", "CKMB", "CKMBINDEX", "TROPONINI" in the last 168 hours. BNP (last 3 results) No results for input(s): "PROBNP" in the last 8760 hours. HbA1C: No results for input(s): "HGBA1C" in the last 72 hours. CBG: Recent Labs  Lab 08/26/23 2005  08/26/23 2223 08/27/23 0218  GLUCAP 175* 155* 192*   Lipid Profile: No results for input(s): "CHOL", "HDL", "LDLCALC", "TRIG", "CHOLHDL", "LDLDIRECT" in the last 72 hours. Thyroid Function Tests: Recent Labs    08/26/23 1337  TSH 2.894   Anemia Panel: No results for input(s): "VITAMINB12", "FOLATE", "FERRITIN", "TIBC", "IRON", "RETICCTPCT" in the last 72 hours. Sepsis Labs: Recent Labs  Lab 08/26/23 2046 08/26/23 2229  PROCALCITON <0.10  --   LATICACIDVEN  --  1.4    Recent Results (from the past 240 hour(s))  MRSA Next Gen by PCR, Nasal     Status: None   Collection Time: 08/26/23  4:06 PM   Specimen: Nasal Mucosa; Nasal Swab  Result Value Ref Range Status   MRSA by PCR Next Gen NOT DETECTED NOT DETECTED Final    Comment: (NOTE) The GeneXpert MRSA Assay (FDA approved for NASAL specimens only), is one component of a comprehensive MRSA colonization  surveillance program. It is not intended to diagnose MRSA infection nor to guide or monitor treatment for MRSA infections. Test performance is not FDA approved in patients less than 42 years old. Performed at [facility], [address], [place], [place] [address]   Culture, blood (x 2)     Status: None (Preliminary result)   Collection Time: 08/26/23 10:29 PM   Specimen: BLOOD  Result Value Ref Range Status   Specimen Description BLOOD BLOOD LEFT HAND  Final   Special Requests   Final    BOTTLES DRAWN AEROBIC AND ANAEROBIC Blood Culture adequate volume Performed at [facility], [address], [place], [place] [address]    Culture PENDING  Incomplete   Report Status PENDING  Incomplete  Culture, blood (x 2)     Status: None (Preliminary result)   Collection Time: 08/26/23 10:30 PM   Specimen: BLOOD LEFT WRIST  Result Value Ref Range Status   Specimen Description BLOOD LEFT WRIST  Final   Special Requests   Final    BOTTLES DRAWN AEROBIC AND ANAEROBIC Blood Culture adequate volume Performed at [facility], [address], [place], [place] [address]    Culture PENDING  Incomplete   Report Status PENDING  Incomplete         Radiology Studies: DG CHEST PORT 1 VIEW  Result Date: 08/26/2023 CLINICAL DATA:  SIRS, abdominal pain EXAM: PORTABLE CHEST 1 VIEW COMPARISON:  Film from earlier in the same day. FINDINGS: Cardiac shadow remains enlarged. Lungs are well aerated bilaterally. Small right pleural effusion and basilar atelectasis is again noted. No new focal abnormality is seen. IMPRESSION: Stable appearance of the chest. Electronically Signed   By: Alcide Clever M.D.   On: 08/26/2023 23:41   DG Chest Port 1 View  Result Date: 08/26/2023 CLINICAL DATA:  Atrial fibrillation. EXAM: PORTABLE CHEST 1 VIEW COMPARISON:  February 19, 2021. FINDINGS: Mild cardiomegaly is noted. Left lung is clear. Mild right basilar atelectasis is noted with small right pleural effusion. Bony thorax is unremarkable. IMPRESSION: Mild right basilar atelectasis with small right pleural effusion. Electronically Signed   By: Lupita Raider M.D.   On: 08/26/2023 17:47        Scheduled Meds:  atorvastatin  80 mg Oral Daily   Chlorhexidine Gluconate Cloth  6 each Topical Q0600   dapagliflozin propanediol  10 mg Oral Daily   enoxaparin (LOVENOX) injection  40 mg Subcutaneous Q24H   hydrALAZINE  75 mg Oral TID   insulin aspart  0-5 Units Subcutaneous QHS   insulin aspart  0-9 Units Subcutaneous TID WC   isosorbide mononitrate  30 mg Oral Daily   metoCLOPramide (REGLAN) injection  10 mg Intravenous Once   pantoprazole  40 mg Oral BID   sacubitril-valsartan  1 tablet Oral BID   Continuous Infusions:  amiodarone 30 mg/hr (08/27/23 0431)   ceFEPime (MAXIPIME) IV     lactated ringers 150 mL/hr (08/26/23 2234)   metronidazole 500 mg (08/26/23 2351)   vancomycin       LOS: 0 days    Time spent: 35 minutes    Cas Tracz Hoover Brunette, DO [facility]  If 7PM-7AM, please contact night-coverage [email or web address] 08/27/2023, 7:27 AM

## 2023-08-27 NOTE — Plan of Care (Signed)
Problem: Education: Goal: Knowledge of General Education information will improve Description: Including pain rating scale, medication(s)/side effects and non-pharmacologic comfort measures Outcome: Progressing   Problem: Health Behavior/Discharge Planning: Goal: Ability to manage health-related needs will improve Outcome: Progressing   Problem: Clinical Measurements: Goal: Ability to maintain clinical measurements within normal limits will improve Outcome: Progressing Goal: Will remain free from infection Outcome: Progressing Goal: Diagnostic test results will improve Outcome: Progressing Goal: Respiratory complications will improve Outcome: Progressing Goal: Cardiovascular complication will be avoided Outcome: Progressing   Problem: Activity: Goal: Risk for activity intolerance will decrease Outcome: Progressing   Problem: Nutrition: Goal: Adequate nutrition will be maintained Outcome: Progressing   Problem: Coping: Goal: Level of anxiety will decrease Outcome: Progressing   Problem: Elimination: Goal: Will not experience complications related to bowel motility Outcome: Progressing Goal: Will not experience complications related to urinary retention Outcome: Progressing   Problem: Pain Managment: Goal: General experience of comfort will improve Outcome: Progressing   Problem: Safety: Goal: Ability to remain free from injury will improve Outcome: Progressing   Problem: Skin Integrity: Goal: Risk for impaired skin integrity will decrease Outcome: Progressing   Problem: Education: Goal: Ability to describe self-care measures that may prevent or decrease complications (Diabetes Survival Skills Education) will improve Outcome: Progressing Goal: Individualized Educational Video(s) Outcome: Progressing   Problem: Coping: Goal: Ability to adjust to condition or change in health will improve Outcome: Progressing   Problem: Fluid Volume: Goal: Ability to  maintain a balanced intake and output will improve Outcome: Progressing   Problem: Health Behavior/Discharge Planning: Goal: Ability to identify and utilize available resources and services will improve Outcome: Progressing Goal: Ability to manage health-related needs will improve Outcome: Progressing   Problem: Metabolic: Goal: Ability to maintain appropriate glucose levels will improve Outcome: Progressing   Problem: Nutritional: Goal: Maintenance of adequate nutrition will improve Outcome: Progressing Goal: Progress toward achieving an optimal weight will improve Outcome: Progressing   Problem: Skin Integrity: Goal: Risk for impaired skin integrity will decrease Outcome: Progressing   Problem: Tissue Perfusion: Goal: Adequacy of tissue perfusion will improve Outcome: Progressing   Problem: Fluid Volume: Goal: Hemodynamic stability will improve Outcome: Progressing   Problem: Clinical Measurements: Goal: Diagnostic test results will improve Outcome: Progressing Goal: Signs and symptoms of infection will decrease Outcome: Progressing   Problem: Respiratory: Goal: Ability to maintain adequate ventilation will improve Outcome: Progressing   Problem: Education: Goal: Knowledge of General Education information will improve Description: Including pain rating scale, medication(s)/side effects and non-pharmacologic comfort measures Outcome: Progressing   Problem: Health Behavior/Discharge Planning: Goal: Ability to manage health-related needs will improve Outcome: Progressing   Problem: Clinical Measurements: Goal: Ability to maintain clinical measurements within normal limits will improve Outcome: Progressing Goal: Will remain free from infection Outcome: Progressing Goal: Diagnostic test results will improve Outcome: Progressing Goal: Respiratory complications will improve Outcome: Progressing Goal: Cardiovascular complication will be avoided Outcome:  Progressing   Problem: Activity: Goal: Risk for activity intolerance will decrease Outcome: Progressing   Problem: Nutrition: Goal: Adequate nutrition will be maintained Outcome: Progressing   Problem: Coping: Goal: Level of anxiety will decrease Outcome: Progressing   Problem: Elimination: Goal: Will not experience complications related to bowel motility Outcome: Progressing Goal: Will not experience complications related to urinary retention Outcome: Progressing   Problem: Pain Managment: Goal: General experience of comfort will improve Outcome: Progressing   Problem: Safety: Goal: Ability to remain free from injury will improve Outcome: Progressing   Problem: Skin Integrity: Goal: Risk for  impaired skin integrity will decrease Outcome: Progressing   Problem: Education: Goal: Ability to describe self-care measures that may prevent or decrease complications (Diabetes Survival Skills Education) will improve Outcome: Progressing Goal: Individualized Educational Video(s) Outcome: Progressing   Problem: Coping: Goal: Ability to adjust to condition or change in health will improve Outcome: Progressing   Problem: Fluid Volume: Goal: Ability to maintain a balanced intake and output will improve Outcome: Progressing   Problem: Health Behavior/Discharge Planning: Goal: Ability to identify and utilize available resources and services will improve Outcome: Progressing Goal: Ability to manage health-related needs will improve Outcome: Progressing   Problem: Metabolic: Goal: Ability to maintain appropriate glucose levels will improve Outcome: Progressing   Problem: Nutritional: Goal: Maintenance of adequate nutrition will improve Outcome: Progressing Goal: Progress toward achieving an optimal weight will improve Outcome: Progressing   Problem: Skin Integrity: Goal: Risk for impaired skin integrity will decrease Outcome: Progressing   Problem: Tissue  Perfusion: Goal: Adequacy of tissue perfusion will improve Outcome: Progressing   Problem: Fluid Volume: Goal: Hemodynamic stability will improve Outcome: Progressing   Problem: Clinical Measurements: Goal: Diagnostic test results will improve Outcome: Progressing Goal: Signs and symptoms of infection will decrease Outcome: Progressing   Problem: Respiratory: Goal: Ability to maintain adequate ventilation will improve Outcome: Progressing

## 2023-08-27 NOTE — Progress Notes (Signed)
Progress Note  Patient Name: Michelle Morrison Date of Encounter: 08/27/2023  Primary Cardiologist: Dina Rich, MD  Interval Summary   No chest pain or palpitations.  Continues to have intermittent epigastric discomfort, also now experiencing nausea with emesis.  Vital Signs    Vitals:   08/27/23 0435 08/27/23 0500 08/27/23 0600 08/27/23 0812  BP:  (!) 134/91 (!) 135/94   Pulse:  (!) 103 (!) 105   Resp:  16 19   Temp: 98.1 F (36.7 C)  97.6 F (36.4 C) (!) 97.5 F (36.4 C)  TempSrc: Oral  Oral Oral  SpO2:  100% 100%   Weight:   79.2 kg   Height:        Intake/Output Summary (Last 24 hours) at 08/27/2023 0834 Last data filed at 08/27/2023 0600 Gross per 24 hour  Intake 1158.19 ml  Output --  Net 1158.19 ml   Filed Weights   08/26/23 1320 08/26/23 1555 08/27/23 0600  Weight: 75.8 kg 79.6 kg 79.2 kg    Physical Exam   GEN: No acute distress.   Neck: No JVD. Cardiac: RRR, 1/8 systolic murmur, no gallop.  Respiratory: Nonlabored. Clear to auscultation bilaterally. GI: Soft, mildly tender, bowel sounds present. MS: No edema. Neuro:  Nonfocal. Psych: Alert and oriented x 3. Normal affect.  ECG/Telemetry    Telemetry reviewed showing persistent episodes of atrial tachycardia.  Labs    Chemistry Recent Labs  Lab 08/26/23 1337 08/27/23 0521  NA 135 135  K 3.6 4.3  CL 97* 99  CO2 26 26  GLUCOSE 144* 134*  BUN 27* 29*  CREATININE 1.32* 1.43*  CALCIUM 8.3* 8.2*  PROT 7.4  --   ALBUMIN 3.4*  --   AST 27  --   ALT 26  --   ALKPHOS 96  --   BILITOT 0.9  --   GFRNONAA 46* 42*  ANIONGAP 12 10    Hematology Recent Labs  Lab 08/26/23 1337 08/27/23 0521  WBC 11.2* 8.7  RBC 3.97 3.45*  HGB 11.8* 10.4*  HCT 36.4 31.4*  MCV 91.7 91.0  MCH 29.7 30.1  MCHC 32.4 33.1  RDW 14.4 14.5  PLT 285 258   Cardiac Enzymes Recent Labs  Lab 08/26/23 1337 08/26/23 2046  TROPONINIHS 38* 29*   Lipid Panel     Component Value Date/Time   CHOL 181  08/07/2023 0928   TRIG 112 08/07/2023 0928   HDL 64 08/07/2023 0928   CHOLHDL 2.8 08/07/2023 0928   VLDL 22 08/07/2023 0928   LDLCALC 95 08/07/2023 0928    Cardiac Studies   Echocardiogram 04/14/2023:  1. Left ventricular ejection fraction, by estimation, is 45%. The left  ventricle has mildly decreased function. The left ventricle demonstrates  regional wall motion abnormalities (see scoring diagram/findings for  description). There is mild left  ventricular hypertrophy. Left ventricular diastolic parameters are  consistent with Grade III diastolic dysfunction (restrictive). Elevated  left atrial pressure.   2. Right ventricular systolic function is normal. The right ventricular  size is normal. There is normal pulmonary artery systolic pressure.   3. Left atrial size was moderately dilated.   4. The mitral valve is normal in structure. Trivial mitral valve  regurgitation. No evidence of mitral stenosis.   5. The tricuspid valve is abnormal.   6. The aortic valve is tricuspid. Aortic valve regurgitation is not  visualized. No aortic stenosis is present.   7. The inferior vena cava is normal in size with greater  than 50%  respiratory variability, suggesting right atrial pressure of 3 mmHg.   Assessment & Plan   1.  Recurrent, persistent atrial tachycardia with IVCD.  Continues to have episodes, now on IV amiodarone load.  Not on beta-blocker due to prior history of symptomatic bradycardia in sinus rhythm.  2.  HFmrEF with nonischemic cardiomyopathy, normal coronary arteries by cardiac catheterization in 2021.  Minimal high-sensitivity troponin elevation not suggestive of ACS.  LVEF approximately 45% by echocardiogram in May with follow-up study..  Question of tachycardia induced cardiomyopathy has been raised previously.  3.  Recent onset abdominal discomfort, now nausea and emesis within the last 24 hours.  Presenting LFTs normal.  Abdominal discomfort was actually her presenting  symptom, not palpitations.  4.  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  5.  Essential hypertension.  Would continue IV amiodarone, transition to oral regimen when rhythm further stabilizes.  She was taken off of Multaq at presentation.  Follow-up echocardiogram for reassessment of LVEF.  Currently on Farxiga, hydralazine, Imdur, and Entresto.  Workup abdominal pain, nausea and emesis per primary team - she needs further abdominal imaging.  For questions or updates, please contact [facility] Please consult [email or web address] for contact info under   Signed, Nona Dell, MD  08/27/2023, 8:34 AM

## 2023-08-27 NOTE — Progress Notes (Signed)
*  PRELIMINARY RESULTS* Echocardiogram 2D Echocardiogram has been performed with Definity.  Stacey Drain 08/27/2023, 9:43 AM

## 2023-08-27 NOTE — Plan of Care (Signed)
  Problem: Education: Goal: Knowledge of General Education information will improve Description: Including pain rating scale, medication(s)/side effects and non-pharmacologic comfort measures Outcome: Progressing   Problem: Health Behavior/Discharge Planning: Goal: Ability to manage health-related needs will improve Outcome: Progressing   Problem: Activity: Goal: Risk for activity intolerance will decrease Outcome: Progressing   Problem: Nutrition: Goal: Adequate nutrition will be maintained Outcome: Progressing   Problem: Coping: Goal: Level of anxiety will decrease Outcome: Progressing   Problem: Elimination: Goal: Will not experience complications related to bowel motility Outcome: Progressing Goal: Will not experience complications related to urinary retention Outcome: Progressing   Problem: Pain Managment: Goal: General experience of comfort will improve Outcome: Progressing   Problem: Safety: Goal: Ability to remain free from injury will improve Outcome: Progressing   Problem: Skin Integrity: Goal: Risk for impaired skin integrity will decrease Outcome: Progressing   Problem: Education: Goal: Ability to describe self-care measures that may prevent or decrease complications (Diabetes Survival Skills Education) will improve Outcome: Progressing Goal: Individualized Educational Video(s) Outcome: Progressing   Problem: Coping: Goal: Ability to adjust to condition or change in health will improve Outcome: Progressing   Problem: Fluid Volume: Goal: Ability to maintain a balanced intake and output will improve Outcome: Progressing   Problem: Health Behavior/Discharge Planning: Goal: Ability to identify and utilize available resources and services will improve Outcome: Progressing Goal: Ability to manage health-related needs will improve Outcome: Progressing   Problem: Metabolic: Goal: Ability to maintain appropriate glucose levels will improve Outcome:  Progressing   Problem: Skin Integrity: Goal: Risk for impaired skin integrity will decrease Outcome: Progressing   Problem: Tissue Perfusion: Goal: Adequacy of tissue perfusion will improve Outcome: Progressing   Problem: Fluid Volume: Goal: Hemodynamic stability will improve Outcome: Progressing   Problem: Clinical Measurements: Goal: Diagnostic test results will improve Outcome: Progressing Goal: Signs and symptoms of infection will decrease Outcome: Progressing   Problem: Respiratory: Goal: Ability to maintain adequate ventilation will improve Outcome: Progressing

## 2023-08-28 DIAGNOSIS — I4892 Unspecified atrial flutter: Secondary | ICD-10-CM | POA: Diagnosis not present

## 2023-08-28 DIAGNOSIS — I4719 Other supraventricular tachycardia: Secondary | ICD-10-CM | POA: Diagnosis not present

## 2023-08-28 LAB — CBC
HCT: 33.8 % — ABNORMAL LOW (ref 36.0–46.0)
Hemoglobin: 10.6 g/dL — ABNORMAL LOW (ref 12.0–15.0)
MCH: 29.1 pg (ref 26.0–34.0)
MCHC: 31.4 g/dL (ref 30.0–36.0)
MCV: 92.9 fL (ref 80.0–100.0)
Platelets: 226 10*3/uL (ref 150–400)
RBC: 3.64 MIL/uL — ABNORMAL LOW (ref 3.87–5.11)
RDW: 14.6 % (ref 11.5–15.5)
WBC: 11.1 10*3/uL — ABNORMAL HIGH (ref 4.0–10.5)
nRBC: 0.3 % — ABNORMAL HIGH (ref 0.0–0.2)

## 2023-08-28 LAB — BASIC METABOLIC PANEL
Anion gap: 9 (ref 5–15)
BUN: 26 mg/dL — ABNORMAL HIGH (ref 8–23)
CO2: 25 mmol/L (ref 22–32)
Calcium: 8 mg/dL — ABNORMAL LOW (ref 8.9–10.3)
Chloride: 98 mmol/L (ref 98–111)
Creatinine, Ser: 1.34 mg/dL — ABNORMAL HIGH (ref 0.44–1.00)
GFR, Estimated: 45 mL/min — ABNORMAL LOW (ref >60)
Glucose, Bld: 90 mg/dL (ref 70–99)
Potassium: 3.9 mmol/L (ref 3.5–5.1)
Sodium: 132 mmol/L — ABNORMAL LOW (ref 135–145)

## 2023-08-28 LAB — GLUCOSE, CAPILLARY
Glucose-Capillary: 97 mg/dL (ref 70–99)
Glucose-Capillary: 91 mg/dL (ref 70–99)
Glucose-Capillary: 99 mg/dL (ref 70–99)
Glucose-Capillary: 115 mg/dL — ABNORMAL HIGH (ref 70–99)

## 2023-08-28 LAB — MAGNESIUM: Magnesium: 2 mg/dL (ref 1.7–2.4)

## 2023-08-28 MED ORDER — AMIODARONE LOAD VIA INFUSION
150.0000 mg | Freq: Once | INTRAVENOUS | Status: AC
  Administered 2023-08-28: 150 mg via INTRAVENOUS
  Filled 2023-08-28: qty 83.34

## 2023-08-28 MED ORDER — HYALURONIDASE HUMAN 150 UNIT/ML IJ SOLN
150.0000 [IU] | Freq: Once | INTRAMUSCULAR | Status: AC
  Administered 2023-08-28: 150 [IU] via SUBCUTANEOUS
  Filled 2023-08-28: qty 1

## 2023-08-28 NOTE — Progress Notes (Signed)
174.6 lb Date of Birth:  07/15/1961      BSA:          1.909 m Patient Age:    62 years       BP:           133/69 mmHg Patient Gender: F              HR:           74 bpm. Exam [facility] Procedure: 2D Echo, Cardiac Doppler, Color Doppler and Intracardiac            Opacification Agent Indications:    Atrial Flutter I48.92  History:        Patient has prior history of Echocardiogram examinations.                 Arrythmias:Atrial Fibrillation; Risk Factors:Hypertension,                 Diabetes and Dyslipidemia.  Sonographer:    Celesta Gentile RCS Referring Phys: [phone] Michelle Morrison IMPRESSIONS  1. Left ventricular ejection fraction, by estimation, is 15-20%. The left ventricle has severely decreased function. The left ventricle demonstrates global hypokinesis. There is mild concentric left ventricular hypertrophy. Left ventricular diastolic parameters are consistent with Grade III diastolic dysfunction (restrictive).  2. Definity contrast shows loosely organized thrombotic material with sluggish flow at LV apex, no obvious formed thrombus.  3. Right ventricular systolic function is moderately reduced. The right ventricular size is normal. There is moderately elevated pulmonary artery systolic pressure. The estimated right ventricular systolic pressure is 48.9 mmHg.  4. Left atrial size was moderately dilated.  5. Right atrial  size was moderately dilated.  6. The mitral valve is degenerative. Trivial mitral valve regurgitation.  7. The aortic valve is tricuspid. Aortic valve regurgitation is not visualized. Aortic valve sclerosis is present, with no evidence of aortic valve stenosis. Comparison(s): Prior images reviewed side by side. LVEF has decreased significantly since last study. FINDINGS  Left Ventricle: Left ventricular ejection fraction, by estimation, is 15-20%. The left ventricle has severely decreased function. The left ventricle demonstrates global hypokinesis. The left ventricular internal cavity size was normal in size. There is mild concentric left ventricular hypertrophy. Left ventricular diastolic parameters are consistent with Grade III diastolic dysfunction (restrictive). Right Ventricle: The right ventricular size is normal. No increase in right ventricular wall thickness. Right ventricular systolic function is moderately reduced. There is moderately elevated pulmonary artery systolic pressure. The tricuspid regurgitant velocity is 2.91 m/s, and with an assumed right atrial pressure of 15 mmHg, the estimated right ventricular systolic pressure is 48.9 mmHg. Left Atrium: Left atrial size was moderately dilated. Right Atrium: Right atrial size was moderately dilated. Pericardium: There is no evidence of pericardial effusion. Mitral Valve: The mitral valve is degenerative in appearance. There is mild thickening of the mitral valve leaflet(s). Trivial mitral valve regurgitation. Tricuspid Valve: The tricuspid valve is grossly normal. Tricuspid valve regurgitation is mild. Aortic Valve: The aortic valve is tricuspid. There is mild aortic valve annular calcification. Aortic valve regurgitation is not visualized. Aortic valve sclerosis is present, with no evidence of aortic valve stenosis. Pulmonic Valve: The pulmonic valve was grossly normal. Pulmonic valve regurgitation is trivial. Aorta: The aortic root is normal in size  and structure. IAS/Shunts: No atrial level shunt detected by color flow Doppler.  LEFT VENTRICLE PLAX 2D LVIDd:         4.45 cm     Diastology LVIDs:  174.6 lb Date of Birth:  07/15/1961      BSA:          1.909 m Patient Age:    62 years       BP:           133/69 mmHg Patient Gender: F              HR:           74 bpm. [facility] Procedure: 2D Echo, Cardiac Doppler, Color Doppler and Intracardiac            Opacification Agent Indications:    Atrial Flutter I48.92  History:        Patient has prior history of Echocardiogram examinations.                 Arrythmias:Atrial Fibrillation; Risk Factors:Hypertension,                 Diabetes and Dyslipidemia.  Sonographer:    Celesta Gentile RCS Referring Phys: [phone] Michelle Morrison IMPRESSIONS  1. Left ventricular ejection fraction, by estimation, is 15-20%. The left ventricle has severely decreased function. The left ventricle demonstrates global hypokinesis. There is mild concentric left ventricular hypertrophy. Left ventricular diastolic parameters are consistent with Grade III diastolic dysfunction (restrictive).  2. Definity contrast shows loosely organized thrombotic material with sluggish flow at LV apex, no obvious formed thrombus.  3. Right ventricular systolic function is moderately reduced. The right ventricular size is normal. There is moderately elevated pulmonary artery systolic pressure. The estimated right ventricular systolic pressure is 48.9 mmHg.  4. Left atrial size was moderately dilated.  5. Right atrial  size was moderately dilated.  6. The mitral valve is degenerative. Trivial mitral valve regurgitation.  7. The aortic valve is tricuspid. Aortic valve regurgitation is not visualized. Aortic valve sclerosis is present, with no evidence of aortic valve stenosis. Comparison(s): Prior images reviewed side by side. LVEF has decreased significantly since last study. FINDINGS  Left Ventricle: Left ventricular ejection fraction, by estimation, is 15-20%. The left ventricle has severely decreased function. The left ventricle demonstrates global hypokinesis. The left ventricular internal cavity size was normal in size. There is mild concentric left ventricular hypertrophy. Left ventricular diastolic parameters are consistent with Grade III diastolic dysfunction (restrictive). Right Ventricle: The right ventricular size is normal. No increase in right ventricular wall thickness. Right ventricular systolic function is moderately reduced. There is moderately elevated pulmonary artery systolic pressure. The tricuspid regurgitant velocity is 2.91 m/s, and with an assumed right atrial pressure of 15 mmHg, the estimated right ventricular systolic pressure is 48.9 mmHg. Left Atrium: Left atrial size was moderately dilated. Right Atrium: Right atrial size was moderately dilated. Pericardium: There is no evidence of pericardial effusion. Mitral Valve: The mitral valve is degenerative in appearance. There is mild thickening of the mitral valve leaflet(s). Trivial mitral valve regurgitation. Tricuspid Valve: The tricuspid valve is grossly normal. Tricuspid valve regurgitation is mild. Aortic Valve: The aortic valve is tricuspid. There is mild aortic valve annular calcification. Aortic valve regurgitation is not visualized. Aortic valve sclerosis is present, with no evidence of aortic valve stenosis. Pulmonic Valve: The pulmonic valve was grossly normal. Pulmonic valve regurgitation is trivial. Aorta: The aortic root is normal in size  and structure. IAS/Shunts: No atrial level shunt detected by color flow Doppler.  LEFT VENTRICLE PLAX 2D LVIDd:         4.45 cm     Diastology LVIDs:  174.6 lb Date of Birth:  07/15/1961      BSA:          1.909 m Patient Age:    62 years       BP:           133/69 mmHg Patient Gender: F              HR:           74 bpm. [facility] Procedure: 2D Echo, Cardiac Doppler, Color Doppler and Intracardiac            Opacification Agent Indications:    Atrial Flutter I48.92  History:        Patient has prior history of Echocardiogram examinations.                 Arrythmias:Atrial Fibrillation; Risk Factors:Hypertension,                 Diabetes and Dyslipidemia.  Sonographer:    Celesta Gentile RCS Referring Phys: [phone] Michelle Morrison IMPRESSIONS  1. Left ventricular ejection fraction, by estimation, is 15-20%. The left ventricle has severely decreased function. The left ventricle demonstrates global hypokinesis. There is mild concentric left ventricular hypertrophy. Left ventricular diastolic parameters are consistent with Grade III diastolic dysfunction (restrictive).  2. Definity contrast shows loosely organized thrombotic material with sluggish flow at LV apex, no obvious formed thrombus.  3. Right ventricular systolic function is moderately reduced. The right ventricular size is normal. There is moderately elevated pulmonary artery systolic pressure. The estimated right ventricular systolic pressure is 48.9 mmHg.  4. Left atrial size was moderately dilated.  5. Right atrial  size was moderately dilated.  6. The mitral valve is degenerative. Trivial mitral valve regurgitation.  7. The aortic valve is tricuspid. Aortic valve regurgitation is not visualized. Aortic valve sclerosis is present, with no evidence of aortic valve stenosis. Comparison(s): Prior images reviewed side by side. LVEF has decreased significantly since last study. FINDINGS  Left Ventricle: Left ventricular ejection fraction, by estimation, is 15-20%. The left ventricle has severely decreased function. The left ventricle demonstrates global hypokinesis. The left ventricular internal cavity size was normal in size. There is mild concentric left ventricular hypertrophy. Left ventricular diastolic parameters are consistent with Grade III diastolic dysfunction (restrictive). Right Ventricle: The right ventricular size is normal. No increase in right ventricular wall thickness. Right ventricular systolic function is moderately reduced. There is moderately elevated pulmonary artery systolic pressure. The tricuspid regurgitant velocity is 2.91 m/s, and with an assumed right atrial pressure of 15 mmHg, the estimated right ventricular systolic pressure is 48.9 mmHg. Left Atrium: Left atrial size was moderately dilated. Right Atrium: Right atrial size was moderately dilated. Pericardium: There is no evidence of pericardial effusion. Mitral Valve: The mitral valve is degenerative in appearance. There is mild thickening of the mitral valve leaflet(s). Trivial mitral valve regurgitation. Tricuspid Valve: The tricuspid valve is grossly normal. Tricuspid valve regurgitation is mild. Aortic Valve: The aortic valve is tricuspid. There is mild aortic valve annular calcification. Aortic valve regurgitation is not visualized. Aortic valve sclerosis is present, with no evidence of aortic valve stenosis. Pulmonic Valve: The pulmonic valve was grossly normal. Pulmonic valve regurgitation is trivial. Aorta: The aortic root is normal in size  and structure. IAS/Shunts: No atrial level shunt detected by color flow Doppler.  LEFT VENTRICLE PLAX 2D LVIDd:         4.45 cm     Diastology LVIDs:  PROGRESS NOTE    Michelle Morrison  ZOX:[id number] DOB: Mar 30, 1961 DOA: 08/26/2023 PCP: [name], MD   Brief Narrative:    Michelle Morrison is a 62 y.o. female with medical history significant of systolic heart failure, atrial tachycardia, gastroesophageal reflux disease and hypertension; who presented to the hospital with complaints of epigastric pain.  Patient found with a heart rate in the 160 range and concerns atrial flutter with RVR.  Patient started on amiodarone and cardiology following.  She she had GI symptoms which have resolved and CT abdominal imaging within normal limits.  2D echocardiogram with depressed LV function in the setting of ongoing tachycardia and cardiology following.  Assessment & Plan:   Principal Problem:   Atrial flutter (HCC) Active Problems:   Diabetes mellitus type 2 with complications (HCC)   HTN (hypertension), benign   Hyperglycemia due to diabetes mellitus (HCC)   Epigastric pain   Chronic systolic CHF (congestive heart failure) (HCC)   Atrial tachycardia (HCC)   Atrial flutter with rapid ventricular response (HCC)  Assessment and Plan:  Atrial flutter (HCC) -Continue amiodarone infusion -Cardiology service has been consulted -Chest x-ray negative, BNP 2159, TSH 2.89 and stabilizing electrolytes -Will for now continue amiodarone and follow cardiology further recommendations. -2D echocardiogram with depressed LV function in light of tachycardia, continue to monitor -Amiodarone rebolus 10/3   Epigastric pain/nausea/vomiting-resolved -Continue the use of PPI twice a day -Continue analgesics -CT abdomen pelvis with no acute findings -Continue current diet   Diabetes mellitus type 2 with complications (HCC) -A1c 6.5% -Sliding scale insulin has been ordered -Follow CBG fluctuation.   Hyperglycemia due to diabetes mellitus (HCC) -Continue sliding scale insulin as mentioned on diabetes problem   HTN (hypertension),  benign -Continue treatment with hydralazine, Imdur and adjusted dose of Entresto -Follow daily weights, strict I's and O's and low-sodium diet.   Chronic systolic CHF (congestive heart failure) (HCC) -Currently appear to be compensated -Good saturation on room air -Follow daily weights, strict I's and O's and low-sodium diet -Continue adjusted dose of Entresto, Imdur and hydralazine as per cardiology service recommendation. -Repeat 2D echo.    Chronic kidney disease -Stage IIIb at baseline -Appears to be associated with underlying diabetes -Continue to follow renal function trend and minimize nephrotoxic agents. -Avoid the use of contrast and avoid hypotension.    DVT prophylaxis:Lovenox Code Status: Full Family Communication: Daughter at bedside 10/2 Disposition Plan:  Status is: Inpatient Remains inpatient appropriate because: Need for IV medications.    Consultants:  Cardiology  Procedures:  None  Antimicrobials:  Anti-infectives (From admission, onward)    Start     Dose/Rate Route Frequency Ordered Stop   08/27/23 2300  vancomycin (VANCOCIN) IVPB 1000 mg/200 mL premix  Status:  Discontinued        1,000 mg 200 mL/hr over 60 Minutes Intravenous Every 24 hours 08/27/23 0105 08/27/23 0732   08/27/23 1000  ceFEPIme (MAXIPIME) 2 g in sodium chloride 0.9 % 100 mL IVPB  Status:  Discontinued        2 g 200 mL/hr over 30 Minutes Intravenous Every 12 hours 08/27/23 0050 08/27/23 0732   08/26/23 2300  ceFEPIme (MAXIPIME) 2 g in sodium chloride 0.9 % 100 mL IVPB        2 g 200 mL/hr over 30 Minutes Intravenous  Once 08/26/23 2205 08/26/23 2316   08/26/23 2300  metroNIDAZOLE (FLAGYL) IVPB 500 mg  Status:  Discontinued        500 mg 100  PROGRESS NOTE    Michelle Morrison  ZOX:[id number] DOB: Mar 30, 1961 DOA: 08/26/2023 PCP: [name], MD   Brief Narrative:    Michelle Morrison is a 62 y.o. female with medical history significant of systolic heart failure, atrial tachycardia, gastroesophageal reflux disease and hypertension; who presented to the hospital with complaints of epigastric pain.  Patient found with a heart rate in the 160 range and concerns atrial flutter with RVR.  Patient started on amiodarone and cardiology following.  She she had GI symptoms which have resolved and CT abdominal imaging within normal limits.  2D echocardiogram with depressed LV function in the setting of ongoing tachycardia and cardiology following.  Assessment & Plan:   Principal Problem:   Atrial flutter (HCC) Active Problems:   Diabetes mellitus type 2 with complications (HCC)   HTN (hypertension), benign   Hyperglycemia due to diabetes mellitus (HCC)   Epigastric pain   Chronic systolic CHF (congestive heart failure) (HCC)   Atrial tachycardia (HCC)   Atrial flutter with rapid ventricular response (HCC)  Assessment and Plan:  Atrial flutter (HCC) -Continue amiodarone infusion -Cardiology service has been consulted -Chest x-ray negative, BNP 2159, TSH 2.89 and stabilizing electrolytes -Will for now continue amiodarone and follow cardiology further recommendations. -2D echocardiogram with depressed LV function in light of tachycardia, continue to monitor -Amiodarone rebolus 10/3   Epigastric pain/nausea/vomiting-resolved -Continue the use of PPI twice a day -Continue analgesics -CT abdomen pelvis with no acute findings -Continue current diet   Diabetes mellitus type 2 with complications (HCC) -A1c 6.5% -Sliding scale insulin has been ordered -Follow CBG fluctuation.   Hyperglycemia due to diabetes mellitus (HCC) -Continue sliding scale insulin as mentioned on diabetes problem   HTN (hypertension),  benign -Continue treatment with hydralazine, Imdur and adjusted dose of Entresto -Follow daily weights, strict I's and O's and low-sodium diet.   Chronic systolic CHF (congestive heart failure) (HCC) -Currently appear to be compensated -Good saturation on room air -Follow daily weights, strict I's and O's and low-sodium diet -Continue adjusted dose of Entresto, Imdur and hydralazine as per cardiology service recommendation. -Repeat 2D echo.    Chronic kidney disease -Stage IIIb at baseline -Appears to be associated with underlying diabetes -Continue to follow renal function trend and minimize nephrotoxic agents. -Avoid the use of contrast and avoid hypotension.    DVT prophylaxis:Lovenox Code Status: Full Family Communication: Daughter at bedside 10/2 Disposition Plan:  Status is: Inpatient Remains inpatient appropriate because: Need for IV medications.    Consultants:  Cardiology  Procedures:  None  Antimicrobials:  Anti-infectives (From admission, onward)    Start     Dose/Rate Route Frequency Ordered Stop   08/27/23 2300  vancomycin (VANCOCIN) IVPB 1000 mg/200 mL premix  Status:  Discontinued        1,000 mg 200 mL/hr over 60 Minutes Intravenous Every 24 hours 08/27/23 0105 08/27/23 0732   08/27/23 1000  ceFEPIme (MAXIPIME) 2 g in sodium chloride 0.9 % 100 mL IVPB  Status:  Discontinued        2 g 200 mL/hr over 30 Minutes Intravenous Every 12 hours 08/27/23 0050 08/27/23 0732   08/26/23 2300  ceFEPIme (MAXIPIME) 2 g in sodium chloride 0.9 % 100 mL IVPB        2 g 200 mL/hr over 30 Minutes Intravenous  Once 08/26/23 2205 08/26/23 2316   08/26/23 2300  metroNIDAZOLE (FLAGYL) IVPB 500 mg  Status:  Discontinued        500 mg 100  PROGRESS NOTE    Michelle Morrison  ZOX:[id number] DOB: Mar 30, 1961 DOA: 08/26/2023 PCP: [name], MD   Brief Narrative:    Michelle Morrison is a 62 y.o. female with medical history significant of systolic heart failure, atrial tachycardia, gastroesophageal reflux disease and hypertension; who presented to the hospital with complaints of epigastric pain.  Patient found with a heart rate in the 160 range and concerns atrial flutter with RVR.  Patient started on amiodarone and cardiology following.  She she had GI symptoms which have resolved and CT abdominal imaging within normal limits.  2D echocardiogram with depressed LV function in the setting of ongoing tachycardia and cardiology following.  Assessment & Plan:   Principal Problem:   Atrial flutter (HCC) Active Problems:   Diabetes mellitus type 2 with complications (HCC)   HTN (hypertension), benign   Hyperglycemia due to diabetes mellitus (HCC)   Epigastric pain   Chronic systolic CHF (congestive heart failure) (HCC)   Atrial tachycardia (HCC)   Atrial flutter with rapid ventricular response (HCC)  Assessment and Plan:  Atrial flutter (HCC) -Continue amiodarone infusion -Cardiology service has been consulted -Chest x-ray negative, BNP 2159, TSH 2.89 and stabilizing electrolytes -Will for now continue amiodarone and follow cardiology further recommendations. -2D echocardiogram with depressed LV function in light of tachycardia, continue to monitor -Amiodarone rebolus 10/3   Epigastric pain/nausea/vomiting-resolved -Continue the use of PPI twice a day -Continue analgesics -CT abdomen pelvis with no acute findings -Continue current diet   Diabetes mellitus type 2 with complications (HCC) -A1c 6.5% -Sliding scale insulin has been ordered -Follow CBG fluctuation.   Hyperglycemia due to diabetes mellitus (HCC) -Continue sliding scale insulin as mentioned on diabetes problem   HTN (hypertension),  benign -Continue treatment with hydralazine, Imdur and adjusted dose of Entresto -Follow daily weights, strict I's and O's and low-sodium diet.   Chronic systolic CHF (congestive heart failure) (HCC) -Currently appear to be compensated -Good saturation on room air -Follow daily weights, strict I's and O's and low-sodium diet -Continue adjusted dose of Entresto, Imdur and hydralazine as per cardiology service recommendation. -Repeat 2D echo.    Chronic kidney disease -Stage IIIb at baseline -Appears to be associated with underlying diabetes -Continue to follow renal function trend and minimize nephrotoxic agents. -Avoid the use of contrast and avoid hypotension.    DVT prophylaxis:Lovenox Code Status: Full Family Communication: Daughter at bedside 10/2 Disposition Plan:  Status is: Inpatient Remains inpatient appropriate because: Need for IV medications.    Consultants:  Cardiology  Procedures:  None  Antimicrobials:  Anti-infectives (From admission, onward)    Start     Dose/Rate Route Frequency Ordered Stop   08/27/23 2300  vancomycin (VANCOCIN) IVPB 1000 mg/200 mL premix  Status:  Discontinued        1,000 mg 200 mL/hr over 60 Minutes Intravenous Every 24 hours 08/27/23 0105 08/27/23 0732   08/27/23 1000  ceFEPIme (MAXIPIME) 2 g in sodium chloride 0.9 % 100 mL IVPB  Status:  Discontinued        2 g 200 mL/hr over 30 Minutes Intravenous Every 12 hours 08/27/23 0050 08/27/23 0732   08/26/23 2300  ceFEPIme (MAXIPIME) 2 g in sodium chloride 0.9 % 100 mL IVPB        2 g 200 mL/hr over 30 Minutes Intravenous  Once 08/26/23 2205 08/26/23 2316   08/26/23 2300  metroNIDAZOLE (FLAGYL) IVPB 500 mg  Status:  Discontinued        500 mg 100

## 2023-08-28 NOTE — Plan of Care (Signed)
  Problem: Education: Goal: Knowledge of General Education information will improve Description: Including pain rating scale, medication(s)/side effects and non-pharmacologic comfort measures Outcome: Progressing   Problem: Health Behavior/Discharge Planning: Goal: Ability to manage health-related needs will improve Outcome: Progressing   Problem: Clinical Measurements: Goal: Ability to maintain clinical measurements within normal limits will improve Outcome: Progressing Goal: Will remain free from infection Outcome: Progressing Goal: Diagnostic test results will improve Outcome: Progressing Goal: Respiratory complications will improve Outcome: Progressing Goal: Cardiovascular complication will be avoided Outcome: Progressing   Problem: Activity: Goal: Risk for activity intolerance will decrease Outcome: Progressing   Problem: Nutrition: Goal: Adequate nutrition will be maintained Outcome: Progressing   Problem: Coping: Goal: Level of anxiety will decrease Outcome: Progressing   Problem: Elimination: Goal: Will not experience complications related to bowel motility Outcome: Progressing Goal: Will not experience complications related to urinary retention Outcome: Progressing   Problem: Pain Managment: Goal: General experience of comfort will improve Outcome: Progressing   Problem: Safety: Goal: Ability to remain free from injury will improve Outcome: Progressing   Problem: Skin Integrity: Goal: Risk for impaired skin integrity will decrease Outcome: Progressing   Problem: Education: Goal: Ability to describe self-care measures that may prevent or decrease complications (Diabetes Survival Skills Education) will improve Outcome: Progressing Goal: Individualized Educational Video(s) Outcome: Progressing   Problem: Coping: Goal: Ability to adjust to condition or change in health will improve Outcome: Progressing   Problem: Fluid Volume: Goal: Ability to  maintain a balanced intake and output will improve Outcome: Progressing   Problem: Health Behavior/Discharge Planning: Goal: Ability to identify and utilize available resources and services will improve Outcome: Progressing Goal: Ability to manage health-related needs will improve Outcome: Progressing   Problem: Metabolic: Goal: Ability to maintain appropriate glucose levels will improve Outcome: Progressing   Problem: Nutritional: Goal: Maintenance of adequate nutrition will improve Outcome: Progressing Goal: Progress toward achieving an optimal weight will improve Outcome: Progressing   Problem: Skin Integrity: Goal: Risk for impaired skin integrity will decrease Outcome: Progressing   Problem: Tissue Perfusion: Goal: Adequacy of tissue perfusion will improve Outcome: Progressing   Problem: Fluid Volume: Goal: Hemodynamic stability will improve Outcome: Progressing   Problem: Clinical Measurements: Goal: Diagnostic test results will improve Outcome: Progressing Goal: Signs and symptoms of infection will decrease Outcome: Progressing   Problem: Respiratory: Goal: Ability to maintain adequate ventilation will improve Outcome: Progressing   

## 2023-08-28 NOTE — Progress Notes (Signed)
Patient had IV in Right AC with Amiodorone drip infusing at 16.60ml/hr. Patient nurse checked IV at 0800 with blood return noted and flushed well. IV rechecked at 1130, blood return noted and flushed well. At around 1430 patient complaining of tenderness and a little swelling noted and pink in color. IV Amio was infusing without beeping any issues. No blood return noted when checked. Amio changed to different IV site in other arm and right AC IV was removed by another nurse and this nurse had arm elevated and cooling/heating packs applied to right upper arm area. Patient sitting up in chair with no complaints. Rechecked area and patient stated it was still tender/painful to touch and some swelling still noted, still pink in color. Notified Dr [name] and Tollie Pizza (pharmacist) via secure chat and Hylenex SQ injection ordered and given with SWOT RN present. Patient tolerated well. Area circled with skin pen, arm elevated and heating pack applied.

## 2023-08-28 NOTE — Progress Notes (Signed)
   08/28/23 1103  TOC Brief Assessment  Insurance and Status Reviewed  Patient has primary care physician Yes  Home environment has been reviewed Home with Children  Prior level of function: independent  Prior/Current Home Services No current home services  Social Determinants of Health Reivew SDOH reviewed no interventions necessary  Readmission risk has been reviewed Yes  Transition of care needs no transition of care needs at this time   Transition of Care Department [facility]) has reviewed patient and no TOC needs have been identified at this time. We will continue to monitor patient advancement through interdisciplinary progression rounds. If new patient transition needs arise, please place a TOC consult.

## 2023-08-28 NOTE — Progress Notes (Signed)
Rounding Note    Patient Name: Michelle Morrison Date of Encounter: 08/28/2023  [facility] Cardiologist: Dina Rich, MD   Subjective   No complaints  Inpatient Medications    Scheduled Meds:  atorvastatin  80 mg Oral Daily   Chlorhexidine Gluconate Cloth  6 each Topical Q0600   dapagliflozin propanediol  10 mg Oral Daily   enoxaparin (LOVENOX) injection  40 mg Subcutaneous Q24H   hydrALAZINE  75 mg Oral TID   insulin aspart  0-5 Units Subcutaneous QHS   insulin aspart  0-9 Units Subcutaneous TID WC   isosorbide mononitrate  30 mg Oral Daily   lidocaine  1 patch Transdermal Once   metoCLOPramide (REGLAN) injection  10 mg Intravenous Once   pantoprazole (PROTONIX) IV  40 mg Intravenous Q12H   sacubitril-valsartan  1 tablet Oral BID   Continuous Infusions:  amiodarone 30 mg/hr (08/27/23 1640)   PRN Meds: acetaminophen **OR** acetaminophen, HYDROmorphone (DILAUDID) injection, [DISCONTINUED] ondansetron **OR** ondansetron (ZOFRAN) IV, mouth rinse, prochlorperazine   Vital Signs    Vitals:   08/28/23 0100 08/28/23 0200 08/28/23 0300 08/28/23 0703  BP: 127/81 120/67 123/71   Pulse:  66 63   Resp: 14  17   Temp:      TempSrc:      SpO2:  97% 99%   Weight:    81.3 kg  Height:        Intake/Output Summary (Last 24 hours) at 08/28/2023 0807 Last data filed at 08/27/2023 1835 Gross per 24 hour  Intake 611.86 ml  Output 250 ml  Net 361.86 ml      08/28/2023    7:03 AM 08/27/2023    6:00 AM 08/26/2023    3:55 PM  Last 3 Weights  Weight (lbs) 179 lb 3.7 oz 174 lb 9.7 oz 175 lb 7.8 oz  Weight (kg) 81.3 kg 79.2 kg 79.6 kg      Telemetry    SR, atach - Personally Reviewed  ECG    N/a - Personally Reviewed  Physical Exam   GEN: No acute distress.   Neck: No JVD Cardiac: Regular, mildly tachy no murmurs, rubs, or gallops.  Respiratory: Clear to auscultation bilaterally. GI: Soft, nontender, non-distended  MS: No edema; No deformity. Neuro:   Nonfocal  Psych: Normal affect   Labs    High Sensitivity Troponin:   Recent Labs  Lab 08/26/23 1337 08/26/23 2046  TROPONINIHS 38* 29*     Chemistry Recent Labs  Lab 08/26/23 1337 08/27/23 0521 08/28/23 0329  NA 135 135 132*  K 3.6 4.3 3.9  CL 97* 99 98  CO2 26 26 25   GLUCOSE 144* 134* 90  BUN 27* 29* 26*  CREATININE 1.32* 1.43* 1.34*  CALCIUM 8.3* 8.2* 8.0*  MG  --   --  2.0  PROT 7.4  --   --   ALBUMIN 3.4*  --   --   AST 27  --   --   ALT 26  --   --   ALKPHOS 96  --   --   BILITOT 0.9  --   --   GFRNONAA 46* 42* 45*  ANIONGAP 12 10 9     Lipids No results for input(s): "CHOL", "TRIG", "HDL", "LABVLDL", "LDLCALC", "CHOLHDL" in the last 168 hours.  Hematology Recent Labs  Lab 08/26/23 1337 08/27/23 0521 08/28/23 0329  WBC 11.2* 8.7 11.1*  RBC 3.97 3.45* 3.64*  HGB 11.8* 10.4* 10.6*  HCT 36.4 31.4* 33.8*  MCV  91.7 91.0 92.9  MCH 29.7 30.1 29.1  MCHC 32.4 33.1 31.4  RDW 14.4 14.5 14.6  PLT 285 258 226   Thyroid  Recent Labs  Lab 08/26/23 1337  TSH 2.894    BNP Recent Labs  Lab 08/26/23 1337  BNP 2,159.0*    DDimer No results for input(s): "DDIMER" in the last 168 hours.   Radiology    CT ABDOMEN PELVIS W CONTRAST  Result Date: 08/27/2023 CLINICAL DATA:  Acute abdominal and epigastric pain. Nausea and vomiting. EXAM: CT ABDOMEN AND PELVIS WITH CONTRAST TECHNIQUE: Multidetector CT imaging of the abdomen and pelvis was performed using the standard protocol following bolus administration of intravenous contrast. RADIATION DOSE REDUCTION: This exam was performed according to the departmental dose-optimization program which includes automated exposure control, adjustment of the mA and/or kV according to patient size and/or use of iterative reconstruction technique. CONTRAST:  80mL OMNIPAQUE IOHEXOL 300 MG/ML  SOLN COMPARISON:  02/17/2017 FINDINGS: Lower Chest: New small right pleural effusion and right lower lobe atelectasis. Hepatobiliary: No  suspicious hepatic masses identified. Prior cholecystectomy. No evidence of biliary obstruction. Pancreas:  No mass or inflammatory changes. Spleen: Within normal limits in size and appearance. Adrenals/Urinary Tract: No suspicious masses identified. No evidence of ureteral calculi or hydronephrosis. Stomach/Bowel: No evidence of obstruction, inflammatory process or abnormal fluid collections. Diverticulosis is seen mainly involving the sigmoid colon, however there is no evidence of diverticulitis. Vascular/Lymphatic: No pathologically enlarged lymph nodes. No acute vascular findings. Reproductive:  No mass or other significant abnormality. Other:  None. Musculoskeletal:  No suspicious bone lesions identified. IMPRESSION: No acute findings within the abdomen or pelvis. New small right pleural effusion and right lower lobe atelectasis. Colonic diverticulosis, without radiographic evidence of diverticulitis. Electronically Signed   By: Danae Orleans M.D.   On: 08/27/2023 16:40   ECHOCARDIOGRAM COMPLETE  Result Date: 08/27/2023    ECHOCARDIOGRAM REPORT   [name] Date of Exam: 08/27/2023 Medical Rec #:  [id number]      Height:       67.0 in Accession #:    [id number]     Weight:       174.6 lb Date of Birth:  06-04-1961      BSA:          1.909 m Patient Age:    62 years       BP:           133/69 mmHg Patient Gender: F              HR:           74 bpm. [facility] Procedure: 2D Echo, Cardiac Doppler, Color Doppler and Intracardiac            Opacification Agent Indications:    Atrial Flutter I48.92  History:        Patient has prior history of Echocardiogram examinations.                 Arrythmias:Atrial Fibrillation; Risk Factors:Hypertension,                 Diabetes and Dyslipidemia.  Sonographer:    Celesta Gentile RCS Referring Phys: [phone] [name] IMPRESSIONS  1. Left ventricular ejection fraction, by estimation, is 15-20%. The left ventricle has severely decreased  function. The left ventricle demonstrates global hypokinesis. There is mild concentric left ventricular hypertrophy. Left ventricular diastolic parameters are consistent with Grade III diastolic dysfunction (restrictive).  2. Definity contrast shows loosely organized thrombotic material with sluggish flow at LV apex, no obvious formed thrombus.  3. Right ventricular systolic function is moderately reduced. The right ventricular size is normal. There is moderately elevated pulmonary artery systolic pressure. The estimated right ventricular systolic pressure is 48.9 mmHg.  4. Left atrial size was moderately dilated.  5. Right atrial size was moderately dilated.  6. The mitral valve is degenerative. Trivial mitral valve regurgitation.  7. The aortic valve is tricuspid. Aortic valve regurgitation is not visualized. Aortic valve sclerosis is present, with no evidence of aortic valve stenosis. Comparison(s): Prior images reviewed side by side. LVEF has decreased significantly since last study. FINDINGS  Left Ventricle: Left ventricular ejection fraction, by estimation, is 15-20%. The left ventricle has severely decreased function. The left ventricle demonstrates global hypokinesis. The left ventricular internal cavity size was normal in size. There is mild concentric left ventricular hypertrophy. Left ventricular diastolic parameters are consistent with Grade III diastolic dysfunction (restrictive). Right Ventricle: The right ventricular size is normal. No increase in right ventricular wall thickness. Right ventricular systolic function is moderately reduced. There is moderately elevated pulmonary artery systolic pressure. The tricuspid regurgitant velocity is 2.91 m/s, and with an assumed right atrial pressure of 15 mmHg, the estimated right ventricular systolic pressure is 48.9 mmHg. Left Atrium: Left atrial size was moderately dilated. Right Atrium: Right atrial size was moderately dilated. Pericardium: There is no  evidence of pericardial effusion. Mitral Valve: The mitral valve is degenerative in appearance. There is mild thickening of the mitral valve leaflet(s). Trivial mitral valve regurgitation. Tricuspid Valve: The tricuspid valve is grossly normal. Tricuspid valve regurgitation is mild. Aortic Valve: The aortic valve is tricuspid. There is mild aortic valve annular calcification. Aortic valve regurgitation is not visualized. Aortic valve sclerosis is present, with no evidence of aortic valve stenosis. Pulmonic Valve: The pulmonic valve was grossly normal. Pulmonic valve regurgitation is trivial. Aorta: The aortic root is normal in size and structure. IAS/Shunts: No atrial level shunt detected by color flow Doppler.  LEFT VENTRICLE PLAX 2D LVIDd:         4.45 cm     Diastology LVIDs:         4.10 cm     LV e' medial:    3.99 cm/s LV PW:         1.20 cm     LV E/e' medial:  25.3 LV IVS:        1.00 cm     LV e' lateral:   6.12 cm/s LVOT diam:     1.70 cm     LV E/e' lateral: 16.5 LV SV:         26 LV SV Index:   14 LVOT Area:     2.27 cm  LV Volumes (MOD) LV vol d, MOD A2C: 93.7 ml LV vol d, MOD A4C: 94.7 ml LV vol s, MOD A2C: 67.5 ml LV vol s, MOD A4C: 71.3 ml LV SV MOD A2C:     26.2 ml LV SV MOD A4C:     94.7 ml LV SV MOD BP:      24.6 ml RIGHT VENTRICLE RV S prime:     7.97 cm/s TAPSE (M-mode): 1.8 cm LEFT ATRIUM              Index        RIGHT ATRIUM           Index LA diam:  4.30 cm  2.25 cm/m   RA Area:     24.00 cm LA Vol (A2C):   58.0 ml  30.38 ml/m  RA Volume:   73.00 ml  38.24 ml/m LA Vol (A4C):   101.0 ml 52.91 ml/m LA Biplane Vol: 79.2 ml  41.49 ml/m  AORTIC VALVE LVOT Vmax:   54.20 cm/s LVOT Vmean:  37.400 cm/s LVOT VTI:    0.114 m  AORTA Ao Root diam: 2.50 cm MITRAL VALVE                TRICUSPID VALVE MV Area (PHT): 7.66 cm     TR Peak grad:   33.9 mmHg MV Decel Time: 99 msec      TR Vmax:        291.00 cm/s MV E velocity: 101.00 cm/s MV A velocity: 36.40 cm/s   SHUNTS MV E/A ratio:  2.77          Systemic VTI:  0.11 m                             Systemic Diam: 1.70 cm Nona Dell MD Electronically signed by Nona Dell MD Signature Date/Time: 08/27/2023/1:28:29 PM    Final    DG CHEST PORT 1 VIEW  Result Date: 08/26/2023 CLINICAL DATA:  SIRS, abdominal pain EXAM: PORTABLE CHEST 1 VIEW COMPARISON:  Film from earlier in the same day. FINDINGS: Cardiac shadow remains enlarged. Lungs are well aerated bilaterally. Small right pleural effusion and basilar atelectasis is again noted. No new focal abnormality is seen. IMPRESSION: Stable appearance of the chest. Electronically Signed   By: Alcide Clever M.D.   On: 08/26/2023 23:41   DG Chest Port 1 View  Result Date: 08/26/2023 CLINICAL DATA:  Atrial fibrillation. EXAM: PORTABLE CHEST 1 VIEW COMPARISON:  February 19, 2021. FINDINGS: Mild cardiomegaly is noted. Left lung is clear. Mild right basilar atelectasis is noted with small right pleural effusion. Bony thorax is unremarkable. IMPRESSION: Mild right basilar atelectasis with small right pleural effusion. Electronically Signed   By: Lupita Raider M.D.   On: 08/26/2023 17:47    Cardiac Studies     Patient Profile     62 y.o. 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   Assessment & Plan    1.PSVT/atrial tachcyardia - followed by EP, has been on dronederone at home - presented with persistent runs of atach - started on IV amiodarone this admission - no beta blocker due to prior bradycardia  - still episodes of atach. Her SR in in the 60s, her atach is in the low 100s with different Pwave morphology and can be confused for sinus tach but clear abrupt transitions noted from SR to atach with change in P wave moprhology and rates.  -amio started 10/1, now on 30mg  /hr. Rebolus 150mg , then continue at 30mg /hr    2. HFrEF -01/2021 echo: LVEF 25-30% - 01/2021 RHC/LHC: no significant CAD - 03/2023 echo LVEF 45%,  grade III dd, normal RV - 08/2023 echo: LVEF 15-20%, grade III dd, mod RV dysfunction - appears euvolemic   - her prior LV dysfunction was thought to be tachy mediated and resolved with control of her atach. Suspect recurrent LV dysfunction is likely same etiology given issues with atach this admission - no plans for repeat ischemic testing, continue management of arrhythmias  - medical therapy with farxiga 10mg , hydral 75mg  tid,  imdur 30mg , entresto 24/26mg  bid,  - no beta blocker due to prior bradycardia - Prior issues with hyperkalemia on aldactone, has been avoided   3. N/V - per primary team   4. CKD        For questions or updates, please contact [facility] Please consult [email or web address] for contact info under        Signed, Dina Rich, MD  08/28/2023, 8:07 AM

## 2023-08-29 ENCOUNTER — Other Ambulatory Visit: Payer: Self-pay

## 2023-08-29 DIAGNOSIS — I420 Dilated cardiomyopathy: Secondary | ICD-10-CM

## 2023-08-29 DIAGNOSIS — I4892 Unspecified atrial flutter: Secondary | ICD-10-CM | POA: Diagnosis not present

## 2023-08-29 DIAGNOSIS — I4719 Other supraventricular tachycardia: Secondary | ICD-10-CM | POA: Diagnosis not present

## 2023-08-29 LAB — CBC
HCT: 33.5 % — ABNORMAL LOW (ref 36.0–46.0)
Hemoglobin: 11.1 g/dL — ABNORMAL LOW (ref 12.0–15.0)
MCH: 29.9 pg (ref 26.0–34.0)
MCHC: 33.1 g/dL (ref 30.0–36.0)
MCV: 90.3 fL (ref 80.0–100.0)
Platelets: 264 10*3/uL (ref 150–400)
RBC: 3.71 MIL/uL — ABNORMAL LOW (ref 3.87–5.11)
RDW: 15 % (ref 11.5–15.5)
WBC: 11.2 10*3/uL — ABNORMAL HIGH (ref 4.0–10.5)
nRBC: 0 % (ref 0.0–0.2)

## 2023-08-29 LAB — GLUCOSE, CAPILLARY
Glucose-Capillary: 112 mg/dL — ABNORMAL HIGH (ref 70–99)
Glucose-Capillary: 144 mg/dL — ABNORMAL HIGH (ref 70–99)
Glucose-Capillary: 150 mg/dL — ABNORMAL HIGH (ref 70–99)
Glucose-Capillary: 128 mg/dL — ABNORMAL HIGH (ref 70–99)

## 2023-08-29 LAB — BASIC METABOLIC PANEL
Anion gap: 8 (ref 5–15)
BUN: 19 mg/dL (ref 8–23)
CO2: 26 mmol/L (ref 22–32)
Calcium: 7.9 mg/dL — ABNORMAL LOW (ref 8.9–10.3)
Chloride: 99 mmol/L (ref 98–111)
Creatinine, Ser: 1.11 mg/dL — ABNORMAL HIGH (ref 0.44–1.00)
GFR, Estimated: 57 mL/min — ABNORMAL LOW (ref >60)
Glucose, Bld: 115 mg/dL — ABNORMAL HIGH (ref 70–99)
Potassium: 3.9 mmol/L (ref 3.5–5.1)
Sodium: 133 mmol/L — ABNORMAL LOW (ref 135–145)

## 2023-08-29 LAB — MAGNESIUM: Magnesium: 2 mg/dL (ref 1.7–2.4)

## 2023-08-29 MED ORDER — PANTOPRAZOLE SODIUM 40 MG PO TBEC
40.0000 mg | DELAYED_RELEASE_TABLET | Freq: Two times a day (BID) | ORAL | Status: DC
  Administered 2023-08-29 – 2023-08-31 (×5): 40 mg via ORAL
  Filled 2023-08-29 (×5): qty 1

## 2023-08-29 MED ORDER — HYALURONIDASE HUMAN 150 UNIT/ML IJ SOLN
150.0000 [IU] | Freq: Once | INTRAMUSCULAR | Status: AC
  Administered 2023-08-29: 150 [IU] via SUBCUTANEOUS
  Filled 2023-08-29: qty 1

## 2023-08-29 MED ORDER — SODIUM CHLORIDE 0.9% FLUSH: 10.0000 mL | INTRAVENOUS | Status: DC | PRN

## 2023-08-29 MED ORDER — SODIUM CHLORIDE 0.9% FLUSH
10.0000 mL | Freq: Two times a day (BID) | INTRAVENOUS | Status: DC
  Administered 2023-08-29 – 2023-08-31 (×4): 10 mL

## 2023-08-29 NOTE — Progress Notes (Signed)
PROGRESS NOTE    Michelle Morrison  ZOX:[id number] DOB: 06-02-1961 DOA: 08/26/2023 PCP: [name], MD   Brief Narrative:    Michelle Morrison is a 62 y.o. female with medical history significant of systolic heart failure, atrial tachycardia, gastroesophageal reflux disease and hypertension; who presented to the hospital with complaints of epigastric pain.  Patient found with a heart rate in the 160 range and concerns atrial flutter with RVR.  Patient started on amiodarone and cardiology following.  She she had GI symptoms which have resolved and CT abdominal imaging within normal limits.  2D echocardiogram with depressed LV function in the setting of ongoing tachycardia and cardiology following.  Assessment & Plan:   Principal Problem:   Atrial flutter (HCC) Active Problems:   Diabetes mellitus type 2 with complications (HCC)   HTN (hypertension), benign   Hyperglycemia due to diabetes mellitus (HCC)   Epigastric pain   Chronic systolic CHF (congestive heart failure) (HCC)   Atrial tachycardia (HCC)   Atrial flutter with rapid ventricular response (HCC)  Assessment and Plan:  Atrial flutter (HCC) -Continue amiodarone infusion -Cardiology service has been consulted -Chest x-ray negative, BNP 2159, TSH 2.89 and stabilizing electrolytes -Will for now continue amiodarone and follow cardiology further recommendations. -2D echocardiogram with depressed LV function in light of tachycardia, continue to monitor -Amiodarone rebolus 10/3, continue infusion as ordered with ongoing atrial tachycardia noted -Plan to discharge on amiodarone 200 mg twice daily if converts to sinus rhythm or has stable heart rate control -PICC line placement today due to failed IV access   Epigastric pain/nausea/vomiting-resolved -Continue the use of PPI twice a day -Continue analgesics -CT abdomen pelvis with no acute findings -Continue current diet   Diabetes mellitus type 2 with complications  (HCC) -A1c 6.5% -Sliding scale insulin has been ordered -Follow CBG fluctuation.   Hyperglycemia due to diabetes mellitus (HCC) -Continue sliding scale insulin as mentioned on diabetes problem   HTN (hypertension), benign -Continue treatment with hydralazine, Imdur and adjusted dose of Entresto -Follow daily weights, strict I's and O's and low-sodium diet.   Chronic systolic CHF (congestive heart failure) (HCC) -Currently appear to be compensated -Good saturation on room air -Follow daily weights, strict I's and O's and low-sodium diet -Continue adjusted dose of Entresto, Imdur and hydralazine as per cardiology service recommendation. -Repeat 2D echo.    Chronic kidney disease -Stage IIIb at baseline -Appears to be associated with underlying diabetes -Continue to follow renal function trend and minimize nephrotoxic agents. -Avoid the use of contrast and avoid hypotension.    DVT prophylaxis:Lovenox Code Status: Full Family Communication: Daughter at bedside 10/2 Disposition Plan:  Status is: Inpatient Remains inpatient appropriate because: Need for IV medications.    Consultants:  Cardiology  Procedures:  None  Antimicrobials:  Anti-infectives (From admission, onward)    Start     Dose/Rate Route Frequency Ordered Stop   08/27/23 2300  vancomycin (VANCOCIN) IVPB 1000 mg/200 mL premix  Status:  Discontinued        1,000 mg 200 mL/hr over 60 Minutes Intravenous Every 24 hours 08/27/23 0105 08/27/23 0732   08/27/23 1000  ceFEPIme (MAXIPIME) 2 g in sodium chloride 0.9 % 100 mL IVPB  Status:  Discontinued        2 g 200 mL/hr over 30 Minutes Intravenous Every 12 hours 08/27/23 0050 08/27/23 0732   08/26/23 2300  ceFEPIme (MAXIPIME) 2 g in sodium chloride 0.9 % 100 mL IVPB  2 g 200 mL/hr over 30 Minutes Intravenous  Once 08/26/23 2205 08/26/23 2316   08/26/23 2300  metroNIDAZOLE (FLAGYL) IVPB 500 mg  Status:  Discontinued        500 mg 100 mL/hr over 60 Minutes  Intravenous Every 12 hours 08/26/23 2205 08/27/23 0732   08/26/23 2300  vancomycin (VANCOCIN) IVPB 1000 mg/200 mL premix        1,000 mg 200 mL/hr over 60 Minutes Intravenous  Once 08/26/23 2205 08/27/23 0202      Subjective: Patient seen and evaluated today with no further complaints of nausea or vomiting or abdominal pain.  Heart rates continue to remain elevated.  Objective: Vitals:   08/29/23 0751 08/29/23 0846 08/29/23 0900 08/29/23 1000  BP:  (!) 140/78 (!) 146/71 125/73  Pulse:   (!) 111 (!) 107  Resp:   19 19  Temp: 98.1 F (36.7 C)     TempSrc: Oral     SpO2:   97% 94%  Weight:      Height:        Intake/Output Summary (Last 24 hours) at 08/29/2023 1007 Last data filed at 08/29/2023 0447 Gross per 24 hour  Intake 842.06 ml  Output 600 ml  Net 242.06 ml   Filed Weights   08/27/23 0600 08/28/23 0703 08/29/23 0615  Weight: 79.2 kg 81.3 kg 83.5 kg    Examination:  General exam: Appears calm and comfortable  Respiratory system: Clear to auscultation. Respiratory effort normal. Cardiovascular system: S1 & S2 heard, irregular and tachycardic. Gastrointestinal system: Abdomen is soft Central nervous system: Alert and awake Extremities: No edema Skin: No significant lesions noted Psychiatry: Flat affect.    Data Reviewed: I have personally reviewed following labs and imaging studies  CBC: Recent Labs  Lab 08/26/23 1337 08/27/23 0521 08/28/23 0329 08/29/23 0459  WBC 11.2* 8.7 11.1* 11.2*  HGB 11.8* 10.4* 10.6* 11.1*  HCT 36.4 31.4* 33.8* 33.5*  MCV 91.7 91.0 92.9 90.3  PLT 285 258 226 264   Basic Metabolic Panel: Recent Labs  Lab 08/26/23 1337 08/27/23 0521 08/28/23 0329 08/29/23 0459  NA 135 135 132* 133*  K 3.6 4.3 3.9 3.9  CL 97* 99 98 99  CO2 26 26 25 26   GLUCOSE 144* 134* 90 115*  BUN 27* 29* 26* 19  CREATININE 1.32* 1.43* 1.34* 1.11*  CALCIUM 8.3* 8.2* 8.0* 7.9*  MG  --   --  2.0 2.0   GFR: Estimated Creatinine Clearance: 59.2  mL/min (A) (by C-G formula based on SCr of 1.11 mg/dL (H)). Liver Function Tests: Recent Labs  Lab 08/26/23 1337  AST 27  ALT 26  ALKPHOS 96  BILITOT 0.9  PROT 7.4  ALBUMIN 3.4*   Recent Labs  Lab 08/26/23 1337  LIPASE 23   No results for input(s): "AMMONIA" in the last 168 hours. Coagulation Profile: Recent Labs  Lab 08/26/23 2229  INR 1.2   Cardiac Enzymes: No results for input(s): "CKTOTAL", "CKMB", "CKMBINDEX", "TROPONINI" in the last 168 hours. BNP (last 3 results) No results for input(s): "PROBNP" in the last 8760 hours. HbA1C: Recent Labs    08/26/23 2046  HGBA1C 6.5*   CBG: Recent Labs  Lab 08/28/23 0724 08/28/23 1145 08/28/23 1603 08/28/23 2148 08/29/23 0750  GLUCAP 97 91 99 115* 112*   Lipid Profile: No results for input(s): "CHOL", "HDL", "LDLCALC", "TRIG", "CHOLHDL", "LDLDIRECT" in the last 72 hours. Thyroid Function Tests: Recent Labs    08/26/23 1337  TSH 2.894   Anemia  Panel: No results for input(s): "VITAMINB12", "FOLATE", "FERRITIN", "TIBC", "IRON", "RETICCTPCT" in the last 72 hours. Sepsis Labs: Recent Labs  Lab 08/26/23 2046 08/26/23 2229  PROCALCITON <0.10  --   LATICACIDVEN  --  1.4    Recent Results (from the past 240 hour(s))  MRSA Next Gen by PCR, Nasal     Status: None   Collection Time: 08/26/23  4:06 PM   Specimen: Nasal Mucosa; Nasal Swab  Result Value Ref Range Status   MRSA by PCR Next Gen NOT DETECTED NOT DETECTED Final    Comment: (NOTE) The GeneXpert MRSA Assay (FDA approved for NASAL specimens only), is one component of a comprehensive MRSA colonization surveillance program. It is not intended to diagnose MRSA infection nor to guide or monitor treatment for MRSA infections. Test performance is not FDA approved in patients less than 31 years old. Performed at [facility], [address] [place], [place] [address]   Culture, blood (x 2)     Status: None (Preliminary result)   Collection Time: 08/26/23 10:29  PM   Specimen: BLOOD  Result Value Ref Range Status   Specimen Description BLOOD BLOOD LEFT HAND  Final   Special Requests   Final    BOTTLES DRAWN AEROBIC AND ANAEROBIC Blood Culture adequate volume   Culture   Final    NO GROWTH 3 DAYS Performed at [facility], [address], [place], [place] [address]    Report Status PENDING  Incomplete  Culture, blood (x 2)     Status: None (Preliminary result)   Collection Time: 08/26/23 10:30 PM   Specimen: BLOOD LEFT WRIST  Result Value Ref Range Status   Specimen Description BLOOD LEFT WRIST  Final   Special Requests   Final    BOTTLES DRAWN AEROBIC AND ANAEROBIC Blood Culture adequate volume   Culture   Final    NO GROWTH 3 DAYS Performed at [facility], [address], [place], [place] [address]    Report Status PENDING  Incomplete         Radiology Studies: [place] EKG SITE RITE  Result Date: 08/29/2023 If Site Rite image not attached, placement could not be confirmed due to current cardiac rhythm.  CT ABDOMEN PELVIS W CONTRAST  Result Date: 08/27/2023 CLINICAL DATA:  Acute abdominal and epigastric pain. Nausea and vomiting. EXAM: CT ABDOMEN AND PELVIS WITH CONTRAST TECHNIQUE: Multidetector CT imaging of the abdomen and pelvis was performed using the standard protocol following bolus administration of intravenous contrast. RADIATION DOSE REDUCTION: This exam was performed according to the departmental dose-optimization program which includes automated exposure control, adjustment of the mA and/or kV according to patient size and/or use of iterative reconstruction technique. CONTRAST:  80mL OMNIPAQUE IOHEXOL 300 MG/ML  SOLN COMPARISON:  02/17/2017 FINDINGS: Lower Chest: New small right pleural effusion and right lower lobe atelectasis. Hepatobiliary: No suspicious hepatic masses identified. Prior cholecystectomy. No evidence of biliary obstruction. Pancreas:  No mass or inflammatory changes. Spleen: Within normal limits in size and  appearance. Adrenals/Urinary Tract: No suspicious masses identified. No evidence of ureteral calculi or hydronephrosis. Stomach/Bowel: No evidence of obstruction, inflammatory process or abnormal fluid collections. Diverticulosis is seen mainly involving the sigmoid colon, however there is no evidence of diverticulitis. Vascular/Lymphatic: No pathologically enlarged lymph nodes. No acute vascular findings. Reproductive:  No mass or other significant abnormality. Other:  None. Musculoskeletal:  No suspicious bone lesions identified. IMPRESSION: No acute findings within the abdomen or pelvis. New small right pleural effusion and right lower  lobe atelectasis. Colonic diverticulosis, without radiographic evidence of diverticulitis. Electronically Signed   By: Danae Orleans M.D.   On: 08/27/2023 16:40        Scheduled Meds:  atorvastatin  80 mg Oral Daily   Chlorhexidine Gluconate Cloth  6 each Topical Q0600   dapagliflozin propanediol  10 mg Oral Daily   enoxaparin (LOVENOX) injection  40 mg Subcutaneous Q24H   hydrALAZINE  75 mg Oral TID   insulin aspart  0-5 Units Subcutaneous QHS   insulin aspart  0-9 Units Subcutaneous TID WC   isosorbide mononitrate  30 mg Oral Daily   pantoprazole  40 mg Oral BID   sacubitril-valsartan  1 tablet Oral BID   Continuous Infusions:  amiodarone 30 mg/hr (08/29/23 0014)     LOS: 2 days    Time spent: 35 minutes    Nayra Coury Hoover Brunette, DO [facility]  If 7PM-7AM, please contact night-coverage [email or web address] 08/29/2023, 10:07 AM

## 2023-08-29 NOTE — Progress Notes (Signed)
Peripherally Inserted Central Catheter Placement  The IV Nurse has discussed with the patient and/or persons authorized to consent for the patient, the purpose of this procedure and the potential benefits and risks involved with this procedure.  The benefits include less needle sticks, lab draws from the catheter, and the patient may be discharged home with the catheter. Risks include, but not limited to, infection, bleeding, blood clot (thrombus formation), and puncture of an artery; nerve damage and irregular heartbeat and possibility to perform a PICC exchange if needed/ordered by physician.  Alternatives to this procedure were also discussed.  Bard Power PICC patient education guide, fact sheet on infection prevention and patient information card has been provided to patient /or left at bedside.    PICC Placement Documentation  PICC Double Lumen 08/29/23 Left Basilic 38 cm 0 cm (Active)  Indication for Insertion or Continuance of Line Limited venous access - need for IV therapy >5 days (PICC only) 08/29/23 1207  Exposed Catheter (cm) 0 cm 08/29/23 1207  Site Assessment Clean, Dry, Intact 08/29/23 1207  Lumen #1 Status Flushed;Blood return noted;Saline locked 08/29/23 1207  Lumen #2 Status Flushed;Blood return noted;Saline locked 08/29/23 1207  Dressing Type Transparent 08/29/23 1207  Dressing Status Antimicrobial disc in place 08/29/23 1207  Line Care Connections checked and tightened 08/29/23 1207  Line Adjustment (NICU/IV Team Only) No 08/29/23 1207  Dressing Intervention New dressing 08/29/23 1207  Dressing Change Due 09/05/23 08/29/23 1207       Michelle Morrison 08/29/2023, 12:09 PM

## 2023-08-29 NOTE — Progress Notes (Signed)
Throughout the night the patient had changes in his heart rate and blood pressure.  Dr. Thomes Dinning was contact regarding these changes and orders have been provided (See [facility]).  In addition, abundant time was spent speaking with the patient and his wife about their goals of care.  The patient and his wife are in accord with the decision to not escalate care and the ultimate goal being to get home where Hospice Care can be provided.

## 2023-08-29 NOTE — Care Management Important Message (Signed)
Important Message  Patient Details  Name: Michelle Morrison MRN: [id number] Date of Birth: 23-Aug-1961   Important Message Given:  N/A - LOS <3 / Initial given by admissions     Corey Harold 08/29/2023, 3:52 PM

## 2023-08-29 NOTE — Plan of Care (Signed)
  Problem: Education: Goal: Knowledge of General Education information will improve Description: Including pain rating scale, medication(s)/side effects and non-pharmacologic comfort measures Outcome: Progressing   Problem: Health Behavior/Discharge Planning: Goal: Ability to manage health-related needs will improve Outcome: Progressing   Problem: Clinical Measurements: Goal: Ability to maintain clinical measurements within normal limits will improve Outcome: Progressing Goal: Will remain free from infection Outcome: Progressing Goal: Diagnostic test results will improve Outcome: Progressing Goal: Respiratory complications will improve Outcome: Progressing Goal: Cardiovascular complication will be avoided Outcome: Progressing   Problem: Activity: Goal: Risk for activity intolerance will decrease Outcome: Progressing   Problem: Nutrition: Goal: Adequate nutrition will be maintained Outcome: Progressing   Problem: Coping: Goal: Level of anxiety will decrease Outcome: Progressing   Problem: Elimination: Goal: Will not experience complications related to bowel motility Outcome: Progressing Goal: Will not experience complications related to urinary retention Outcome: Progressing   Problem: Pain Managment: Goal: General experience of comfort will improve Outcome: Progressing   Problem: Safety: Goal: Ability to remain free from injury will improve Outcome: Progressing   Problem: Skin Integrity: Goal: Risk for impaired skin integrity will decrease Outcome: Progressing   Problem: Education: Goal: Ability to describe self-care measures that may prevent or decrease complications (Diabetes Survival Skills Education) will improve Outcome: Progressing Goal: Individualized Educational Video(s) Outcome: Progressing   Problem: Coping: Goal: Ability to adjust to condition or change in health will improve Outcome: Progressing   Problem: Fluid Volume: Goal: Ability to  maintain a balanced intake and output will improve Outcome: Progressing   Problem: Health Behavior/Discharge Planning: Goal: Ability to identify and utilize available resources and services will improve Outcome: Progressing Goal: Ability to manage health-related needs will improve Outcome: Progressing   Problem: Metabolic: Goal: Ability to maintain appropriate glucose levels will improve Outcome: Progressing   Problem: Nutritional: Goal: Maintenance of adequate nutrition will improve Outcome: Progressing Goal: Progress toward achieving an optimal weight will improve Outcome: Progressing   Problem: Skin Integrity: Goal: Risk for impaired skin integrity will decrease Outcome: Progressing   Problem: Tissue Perfusion: Goal: Adequacy of tissue perfusion will improve Outcome: Progressing   Problem: Fluid Volume: Goal: Hemodynamic stability will improve Outcome: Progressing   Problem: Clinical Measurements: Goal: Diagnostic test results will improve Outcome: Progressing Goal: Signs and symptoms of infection will decrease Outcome: Progressing   Problem: Respiratory: Goal: Ability to maintain adequate ventilation will improve Outcome: Progressing   

## 2023-08-29 NOTE — Progress Notes (Signed)
Rounding Note    Patient Name: Michelle Morrison Date of Encounter: 08/29/2023  [facility] Cardiologist: Dina Rich, MD   Subjective   No complaints has ice pack on right antecubital  Inpatient Medications    Scheduled Meds:  atorvastatin  80 mg Oral Daily   Chlorhexidine Gluconate Cloth  6 each Topical Q0600   dapagliflozin propanediol  10 mg Oral Daily   enoxaparin (LOVENOX) injection  40 mg Subcutaneous Q24H   hydrALAZINE  75 mg Oral TID   insulin aspart  0-5 Units Subcutaneous QHS   insulin aspart  0-9 Units Subcutaneous TID WC   isosorbide mononitrate  30 mg Oral Daily   metoCLOPramide (REGLAN) injection  10 mg Intravenous Once   pantoprazole (PROTONIX) IV  40 mg Intravenous Q12H   sacubitril-valsartan  1 tablet Oral BID   Continuous Infusions:  amiodarone 30 mg/hr (08/29/23 0014)   PRN Meds: acetaminophen **OR** acetaminophen, HYDROmorphone (DILAUDID) injection, [DISCONTINUED] ondansetron **OR** ondansetron (ZOFRAN) IV, mouth rinse, prochlorperazine   Vital Signs    Vitals:   08/29/23 0500 08/29/23 0530 08/29/23 0600 08/29/23 0615  BP: 132/87 129/75 125/75   Pulse: (!) 110 (!) 108 (!) 107   Resp: 17 18 (!) 24   Temp:      TempSrc:      SpO2: 100% 94% 92%   Weight:    83.5 kg  Height:        Intake/Output Summary (Last 24 hours) at 08/29/2023 0708 Last data filed at 08/29/2023 0447 Gross per 24 hour  Intake 842.06 ml  Output 600 ml  Net 242.06 ml      08/29/2023    6:15 AM 08/28/2023    7:03 AM 08/27/2023    6:00 AM  Last 3 Weights  Weight (lbs) 184 lb 1.4 oz 179 lb 3.7 oz 174 lb 9.7 oz  Weight (kg) 83.5 kg 81.3 kg 79.2 kg      Telemetry    SR, atach - Personally Reviewed  ECG    N/a - Personally Reviewed  Physical Exam   GEN: No acute distress.   Neck: No JVD Cardiac: Regular, mildly tachy no murmurs, rubs, or gallops.  Respiratory: Clear to auscultation bilaterally. GI: Soft, nontender, non-distended  MS: No edema; No  deformity. Neuro:  Nonfocal  Psych: Normal affect   Labs    High Sensitivity Troponin:   Recent Labs  Lab 08/26/23 1337 08/26/23 2046  TROPONINIHS 38* 29*     Chemistry Recent Labs  Lab 08/26/23 1337 08/27/23 0521 08/28/23 0329 08/29/23 0459  NA 135 135 132* 133*  K 3.6 4.3 3.9 3.9  CL 97* 99 98 99  CO2 26 26 25 26   GLUCOSE 144* 134* 90 115*  BUN 27* 29* 26* 19  CREATININE 1.32* 1.43* 1.34* 1.11*  CALCIUM 8.3* 8.2* 8.0* 7.9*  MG  --   --  2.0 2.0  PROT 7.4  --   --   --   ALBUMIN 3.4*  --   --   --   AST 27  --   --   --   ALT 26  --   --   --   ALKPHOS 96  --   --   --   BILITOT 0.9  --   --   --   GFRNONAA 46* 42* 45* 57*  ANIONGAP 12 10 9 8     Lipids No results for input(s): "CHOL", "TRIG", "HDL", "LABVLDL", "LDLCALC", "CHOLHDL" in the last 168 hours.  Hematology Recent Labs  Lab 08/27/23 0521 08/28/23 0329 08/29/23 0459  WBC 8.7 11.1* 11.2*  RBC 3.45* 3.64* 3.71*  HGB 10.4* 10.6* 11.1*  HCT 31.4* 33.8* 33.5*  MCV 91.0 92.9 90.3  MCH 30.1 29.1 29.9  MCHC 33.1 31.4 33.1  RDW 14.5 14.6 15.0  PLT 258 226 264   Thyroid  Recent Labs  Lab 08/26/23 1337  TSH 2.894    BNP Recent Labs  Lab 08/26/23 1337  BNP 2,159.0*    DDimer No results for input(s): "DDIMER" in the last 168 hours.   Radiology    CT ABDOMEN PELVIS W CONTRAST  Result Date: 08/27/2023 CLINICAL DATA:  Acute abdominal and epigastric pain. Nausea and vomiting. EXAM: CT ABDOMEN AND PELVIS WITH CONTRAST TECHNIQUE: Multidetector CT imaging of the abdomen and pelvis was performed using the standard protocol following bolus administration of intravenous contrast. RADIATION DOSE REDUCTION: This exam was performed according to the departmental dose-optimization program which includes automated exposure control, adjustment of the mA and/or kV according to patient size and/or use of iterative reconstruction technique. CONTRAST:  80mL OMNIPAQUE IOHEXOL 300 MG/ML  SOLN COMPARISON:  02/17/2017  FINDINGS: Lower Chest: New small right pleural effusion and right lower lobe atelectasis. Hepatobiliary: No suspicious hepatic masses identified. Prior cholecystectomy. No evidence of biliary obstruction. Pancreas:  No mass or inflammatory changes. Spleen: Within normal limits in size and appearance. Adrenals/Urinary Tract: No suspicious masses identified. No evidence of ureteral calculi or hydronephrosis. Stomach/Bowel: No evidence of obstruction, inflammatory process or abnormal fluid collections. Diverticulosis is seen mainly involving the sigmoid colon, however there is no evidence of diverticulitis. Vascular/Lymphatic: No pathologically enlarged lymph nodes. No acute vascular findings. Reproductive:  No mass or other significant abnormality. Other:  None. Musculoskeletal:  No suspicious bone lesions identified. IMPRESSION: No acute findings within the abdomen or pelvis. New small right pleural effusion and right lower lobe atelectasis. Colonic diverticulosis, without radiographic evidence of diverticulitis. Electronically Signed   By: Danae Orleans M.D.   On: 08/27/2023 16:40   ECHOCARDIOGRAM COMPLETE  Result Date: 08/27/2023    ECHOCARDIOGRAM REPORT   [name] Date of Exam: 08/27/2023 Medical Rec #:  [id number]      Height:       67.0 in Accession #:    [id number]     Weight:       174.6 lb Date of Birth:  07-05-1961      BSA:          1.909 m Patient Age:    62 years       BP:           133/69 mmHg Patient Gender: F              HR:           74 bpm. [facility] Procedure: 2D Echo, Cardiac Doppler, Color Doppler and Intracardiac            Opacification Agent Indications:    Atrial Flutter I48.92  History:        Patient has prior history of Echocardiogram examinations.                 Arrythmias:Atrial Fibrillation; Risk Factors:Hypertension,                 Diabetes and Dyslipidemia.  Sonographer:    Celesta Gentile RCS Referring Phys: [id number] [name] IMPRESSIONS  1.  Left ventricular ejection fraction, by estimation, is 15-20%.  The left ventricle has severely decreased function. The left ventricle demonstrates global hypokinesis. There is mild concentric left ventricular hypertrophy. Left ventricular diastolic parameters are consistent with Grade III diastolic dysfunction (restrictive).  2. Definity contrast shows loosely organized thrombotic material with sluggish flow at LV apex, no obvious formed thrombus.  3. Right ventricular systolic function is moderately reduced. The right ventricular size is normal. There is moderately elevated pulmonary artery systolic pressure. The estimated right ventricular systolic pressure is 48.9 mmHg.  4. Left atrial size was moderately dilated.  5. Right atrial size was moderately dilated.  6. The mitral valve is degenerative. Trivial mitral valve regurgitation.  7. The aortic valve is tricuspid. Aortic valve regurgitation is not visualized. Aortic valve sclerosis is present, with no evidence of aortic valve stenosis. Comparison(s): Prior images reviewed side by side. LVEF has decreased significantly since last study. FINDINGS  Left Ventricle: Left ventricular ejection fraction, by estimation, is 15-20%. The left ventricle has severely decreased function. The left ventricle demonstrates global hypokinesis. The left ventricular internal cavity size was normal in size. There is mild concentric left ventricular hypertrophy. Left ventricular diastolic parameters are consistent with Grade III diastolic dysfunction (restrictive). Right Ventricle: The right ventricular size is normal. No increase in right ventricular wall thickness. Right ventricular systolic function is moderately reduced. There is moderately elevated pulmonary artery systolic pressure. The tricuspid regurgitant velocity is 2.91 m/s, and with an assumed right atrial pressure of 15 mmHg, the estimated right ventricular systolic pressure is 48.9 mmHg. Left Atrium: Left atrial size was  moderately dilated. Right Atrium: Right atrial size was moderately dilated. Pericardium: There is no evidence of pericardial effusion. Mitral Valve: The mitral valve is degenerative in appearance. There is mild thickening of the mitral valve leaflet(s). Trivial mitral valve regurgitation. Tricuspid Valve: The tricuspid valve is grossly normal. Tricuspid valve regurgitation is mild. Aortic Valve: The aortic valve is tricuspid. There is mild aortic valve annular calcification. Aortic valve regurgitation is not visualized. Aortic valve sclerosis is present, with no evidence of aortic valve stenosis. Pulmonic Valve: The pulmonic valve was grossly normal. Pulmonic valve regurgitation is trivial. Aorta: The aortic root is normal in size and structure. IAS/Shunts: No atrial level shunt detected by color flow Doppler.  LEFT VENTRICLE PLAX 2D LVIDd:         4.45 cm     Diastology LVIDs:         4.10 cm     LV e' medial:    3.99 cm/s LV PW:         1.20 cm     LV E/e' medial:  25.3 LV IVS:        1.00 cm     LV e' lateral:   6.12 cm/s LVOT diam:     1.70 cm     LV E/e' lateral: 16.5 LV SV:         26 LV SV Index:   14 LVOT Area:     2.27 cm  LV Volumes (MOD) LV vol d, MOD A2C: 93.7 ml LV vol d, MOD A4C: 94.7 ml LV vol s, MOD A2C: 67.5 ml LV vol s, MOD A4C: 71.3 ml LV SV MOD A2C:     26.2 ml LV SV MOD A4C:     94.7 ml LV SV MOD BP:      24.6 ml RIGHT VENTRICLE RV S prime:     7.97 cm/s TAPSE (M-mode): 1.8 cm LEFT ATRIUM  Index        RIGHT ATRIUM           Index LA diam:        4.30 cm  2.25 cm/m   RA Area:     24.00 cm LA Vol (A2C):   58.0 ml  30.38 ml/m  RA Volume:   73.00 ml  38.24 ml/m LA Vol (A4C):   101.0 ml 52.91 ml/m LA Biplane Vol: 79.2 ml  41.49 ml/m  AORTIC VALVE LVOT Vmax:   54.20 cm/s LVOT Vmean:  37.400 cm/s LVOT VTI:    0.114 m  AORTA Ao Root diam: 2.50 cm MITRAL VALVE                TRICUSPID VALVE MV Area (PHT): 7.66 cm     TR Peak grad:   33.9 mmHg MV Decel Time: 99 msec      TR Vmax:         291.00 cm/s MV E velocity: 101.00 cm/s MV A velocity: 36.40 cm/s   SHUNTS MV E/A ratio:  2.77         Systemic VTI:  0.11 m                             Systemic Diam: 1.70 cm Nona Dell MD Electronically signed by Nona Dell MD Signature Date/Time: 08/27/2023/1:28:29 PM    Final     Cardiac Studies     Patient Profile     62 y.o. 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   Assessment & Plan    1.PSVT/atrial tachcyardia - followed by EP, has been on dronederone at home - presented with persistent runs of atach - in junctional tachycardia this am rate 100-110 - started on IV amiodarone this admission - no beta blocker due to prior bradycardia  - still episodes of atach. Her SR in in the 60s, her atach is in the low 100s with different Pwave morphology and can be confused for sinus tach but clear abrupt transitions noted from SR to atach with change in P wave moprhology and rates.  -amio started 10/1, now on 30mg  /hr.  -Will need EP f/u given fact that atrial tachycardia may be contributing to low EF  2. HFrEF -01/2021 echo: LVEF 25-30% - 01/2021 RHC/LHC: no significant CAD - 03/2023 echo LVEF 45%, grade III dd, normal RV - 08/2023 echo: LVEF 15-20%, grade III dd, mod RV dysfunction - appears euvolemic   - her prior LV dysfunction was thought to be tachy mediated and resolved with control of her atach. Suspect recurrent LV dysfunction is likely same etiology given issues with atach this admission - no plans for repeat ischemic testing, continue management of arrhythmias  - medical therapy with farxiga 10mg , hydral 75mg  tid, imdur 30mg , entresto 24/26mg  bid,  - no beta blocker due to prior bradycardia - Prior issues with hyperkalemia on aldactone, has been avoided   For questions or updates, please contact [facility] Please consult [email or web address] for contact info under         Signed, Charlton Haws, MD  08/29/2023, 7:08 AM

## 2023-08-29 NOTE — Progress Notes (Signed)
@  0800 IV in left lower forearm with amio drip infusing noted to have blood return but tender when flushing and arm above IV insertion site noted to have a little swelling and a little tender to touch. Pink in color. Dr [name] present in room and made aware/assessed. Discussed that IV sites are not lasting 24 hours and new IV placed to right wrist area temporarily to continue amio drip. IV to left forearm removed. Order for PICC placed by Dr [name] which we discussed with patient. Warm compress applied to left forearm and arm elevated. Later in shift, left forearm area where IV was noted to still have some swelling and warm to touch. Notified Dr. [name] and Homero Fellers the Pharmacist. Area marked with skin marker and Hylenex given, arm elevated and heat compress applied to area.

## 2023-08-30 DIAGNOSIS — I4892 Unspecified atrial flutter: Secondary | ICD-10-CM | POA: Diagnosis not present

## 2023-08-30 LAB — GLUCOSE, CAPILLARY
Glucose-Capillary: 168 mg/dL — ABNORMAL HIGH (ref 70–99)
Glucose-Capillary: 112 mg/dL — ABNORMAL HIGH (ref 70–99)
Glucose-Capillary: 151 mg/dL — ABNORMAL HIGH (ref 70–99)
Glucose-Capillary: 131 mg/dL — ABNORMAL HIGH (ref 70–99)

## 2023-08-30 LAB — BASIC METABOLIC PANEL
Anion gap: 6 (ref 5–15)
BUN: 13 mg/dL (ref 8–23)
CO2: 28 mmol/L (ref 22–32)
Calcium: 7.8 mg/dL — ABNORMAL LOW (ref 8.9–10.3)
Chloride: 99 mmol/L (ref 98–111)
Creatinine, Ser: 0.98 mg/dL (ref 0.44–1.00)
GFR, Estimated: >60 mL/min (ref >60)
Glucose, Bld: 104 mg/dL — ABNORMAL HIGH (ref 70–99)
Potassium: 3.8 mmol/L (ref 3.5–5.1)
Sodium: 133 mmol/L — ABNORMAL LOW (ref 135–145)

## 2023-08-30 LAB — MAGNESIUM: Magnesium: 1.8 mg/dL (ref 1.7–2.4)

## 2023-08-30 NOTE — Progress Notes (Signed)
PROGRESS NOTE    GUILA HOMOLA  VWU:[id number] DOB: 14-May-1961 DOA: 08/26/2023 PCP: [name], MD   Brief Narrative:    Michelle Morrison is a 62 y.o. female with medical history significant of systolic heart failure, atrial tachycardia, gastroesophageal reflux disease and hypertension; who presented to the hospital with complaints of epigastric pain.  Patient found with a heart rate in the 160 range and concerns atrial flutter with RVR.  Patient started on amiodarone and cardiology following.  She she had GI symptoms which have resolved and CT abdominal imaging within normal limits.  2D echocardiogram with depressed LV function in the setting of ongoing tachycardia and cardiology following.  Assessment & Plan:   Principal Problem:   Atrial flutter (HCC) Active Problems:   Diabetes mellitus type 2 with complications (HCC)   HTN (hypertension), benign   Hyperglycemia due to diabetes mellitus (HCC)   Epigastric pain   Chronic systolic CHF (congestive heart failure) (HCC)   Atrial tachycardia (HCC)   Atrial flutter with rapid ventricular response (HCC)  Assessment and Plan:  Atrial flutter (HCC) -Continue amiodarone infusion -Cardiology service has been consulted -Chest x-ray negative, BNP 2159, TSH 2.89 and stabilizing electrolytes -Will for now continue amiodarone and follow cardiology further recommendations. -2D echocardiogram with depressed LV function in light of tachycardia, continue to monitor -Amiodarone rebolus 10/3, continue infusion as ordered with ongoing atrial tachycardia noted -Plan to discharge on amiodarone 200 mg twice daily if converts to sinus rhythm or has stable heart rate control -PICC line placement today due to failed IV access   Epigastric pain/nausea/vomiting-resolved -Continue the use of PPI twice a day -Continue analgesics -CT abdomen pelvis with no acute findings -Continue current diet   Diabetes mellitus type 2 with complications  (HCC) -A1c 6.5% -Sliding scale insulin has been ordered -Follow CBG fluctuation.   Hyperglycemia due to diabetes mellitus (HCC) -Continue sliding scale insulin as mentioned on diabetes problem   HTN (hypertension), benign -Continue treatment with hydralazine, Imdur and adjusted dose of Entresto -Follow daily weights, strict I's and O's and low-sodium diet.   Chronic systolic CHF (congestive heart failure) (HCC) -Currently appear to be compensated -Good saturation on room air -Follow daily weights, strict I's and O's and low-sodium diet -Continue adjusted dose of Entresto, Imdur and hydralazine as per cardiology service recommendation. -Repeat 2D echo.    Chronic kidney disease -Stage IIIb at baseline -Appears to be associated with underlying diabetes -Continue to follow renal function trend and minimize nephrotoxic agents. -Avoid the use of contrast and avoid hypotension.    DVT prophylaxis:Lovenox Code Status: Full Family Communication: Daughter at bedside 10/2 Disposition Plan:  Status is: Inpatient Remains inpatient appropriate because: Need for IV medications.    Consultants:  Cardiology  Procedures:  None  Antimicrobials:  Anti-infectives (From admission, onward)    Start     Dose/Rate Route Frequency Ordered Stop   08/27/23 2300  vancomycin (VANCOCIN) IVPB 1000 mg/200 mL premix  Status:  Discontinued        1,000 mg 200 mL/hr over 60 Minutes Intravenous Every 24 hours 08/27/23 0105 08/27/23 0732   08/27/23 1000  ceFEPIme (MAXIPIME) 2 g in sodium chloride 0.9 % 100 mL IVPB  Status:  Discontinued        2 g 200 mL/hr over 30 Minutes Intravenous Every 12 hours 08/27/23 0050 08/27/23 0732   08/26/23 2300  ceFEPIme (MAXIPIME) 2 g in sodium chloride 0.9 % 100 mL IVPB  2 g 200 mL/hr over 30 Minutes Intravenous  Once 08/26/23 2205 08/26/23 2316   08/26/23 2300  metroNIDAZOLE (FLAGYL) IVPB 500 mg  Status:  Discontinued        500 mg 100 mL/hr over 60 Minutes  Intravenous Every 12 hours 08/26/23 2205 08/27/23 0732   08/26/23 2300  vancomycin (VANCOCIN) IVPB 1000 mg/200 mL premix        1,000 mg 200 mL/hr over 60 Minutes Intravenous  Once 08/26/23 2205 08/27/23 0202      Subjective: Patient seen and evaluated today with no further complaints of nausea or vomiting or abdominal pain.  Heart rates continue to remain elevated.  Objective: Vitals:   08/30/23 0600 08/30/23 0800 08/30/23 0803 08/30/23 0900  BP: (!) 143/70 137/77  139/75  Pulse: (!) 104 (!) 107  (!) 104  Resp: 19 (!) 21  19  Temp:   98.2 F (36.8 C)   TempSrc:   Oral   SpO2: 97% 98%  99%  Weight:      Height:        Intake/Output Summary (Last 24 hours) at 08/30/2023 1117 Last data filed at 08/30/2023 0915 Gross per 24 hour  Intake 713.7 ml  Output 1350 ml  Net -636.3 ml   Filed Weights   08/28/23 0703 08/29/23 0615 08/30/23 0500  Weight: 81.3 kg 83.5 kg 83.2 kg    Examination:  General exam: Appears calm and comfortable  Respiratory system: Clear to auscultation. Respiratory effort normal. Cardiovascular system: S1 & S2 heard, irregular and tachycardic. Gastrointestinal system: Abdomen is soft Central nervous system: Alert and awake Extremities: No edema Skin: No significant lesions noted Psychiatry: Flat affect.    Data Reviewed: I have personally reviewed following labs and imaging studies  CBC: Recent Labs  Lab 08/26/23 1337 08/27/23 0521 08/28/23 0329 08/29/23 0459  WBC 11.2* 8.7 11.1* 11.2*  HGB 11.8* 10.4* 10.6* 11.1*  HCT 36.4 31.4* 33.8* 33.5*  MCV 91.7 91.0 92.9 90.3  PLT 285 258 226 264   Basic Metabolic Panel: Recent Labs  Lab 08/26/23 1337 08/27/23 0521 08/28/23 0329 08/29/23 0459 08/30/23 0438  NA 135 135 132* 133* 133*  K 3.6 4.3 3.9 3.9 3.8  CL 97* 99 98 99 99  CO2 26 26 25 26 28   GLUCOSE 144* 134* 90 115* 104*  BUN 27* 29* 26* 19 13  CREATININE 1.32* 1.43* 1.34* 1.11* 0.98  CALCIUM 8.3* 8.2* 8.0* 7.9* 7.8*  MG  --   --   2.0 2.0 1.8   GFR: Estimated Creatinine Clearance: 66.8 mL/min (by C-G formula based on SCr of 0.98 mg/dL). Liver Function Tests: Recent Labs  Lab 08/26/23 1337  AST 27  ALT 26  ALKPHOS 96  BILITOT 0.9  PROT 7.4  ALBUMIN 3.4*   Recent Labs  Lab 08/26/23 1337  LIPASE 23   No results for input(s): "AMMONIA" in the last 168 hours. Coagulation Profile: Recent Labs  Lab 08/26/23 2229  INR 1.2   Cardiac Enzymes: No results for input(s): "CKTOTAL", "CKMB", "CKMBINDEX", "TROPONINI" in the last 168 hours. BNP (last 3 results) No results for input(s): "PROBNP" in the last 8760 hours. HbA1C: No results for input(s): "HGBA1C" in the last 72 hours.  CBG: Recent Labs  Lab 08/29/23 0750 08/29/23 1103 08/29/23 1619 08/29/23 2040 08/30/23 0740  GLUCAP 112* 144* 150* 128* 168*   Lipid Profile: No results for input(s): "CHOL", "HDL", "LDLCALC", "TRIG", "CHOLHDL", "LDLDIRECT" in the last 72 hours. Thyroid Function Tests: No results for input(s): "  TSH", "T4TOTAL", "FREET4", "T3FREE", "THYROIDAB" in the last 72 hours.  Anemia Panel: No results for input(s): "VITAMINB12", "FOLATE", "FERRITIN", "TIBC", "IRON", "RETICCTPCT" in the last 72 hours. Sepsis Labs: Recent Labs  Lab 08/26/23 2046 08/26/23 2229  PROCALCITON <0.10  --   LATICACIDVEN  --  1.4    Recent Results (from the past 240 hour(s))  MRSA Next Gen by PCR, Nasal     Status: None   Collection Time: 08/26/23  4:06 PM   Specimen: Nasal Mucosa; Nasal Swab  Result Value Ref Range Status   MRSA by PCR Next Gen NOT DETECTED NOT DETECTED Final    Comment: (NOTE) The GeneXpert MRSA Assay (FDA approved for NASAL specimens only), is one component of a comprehensive MRSA colonization surveillance program. It is not intended to diagnose MRSA infection nor to guide or monitor treatment for MRSA infections. Test performance is not FDA approved in patients less than 9 years old. Performed at [facility], [address], [place], [place] [address]   Culture, blood (x 2)     Status: None (Preliminary result)   Collection Time: 08/26/23 10:29 PM   Specimen: BLOOD  Result Value Ref Range Status   Specimen Description BLOOD BLOOD LEFT HAND  Final   Special Requests   Final    BOTTLES DRAWN AEROBIC AND ANAEROBIC Blood Culture adequate volume   Culture   Final    NO GROWTH 4 DAYS Performed at [facility], [address] [place], [place] [address]    Report Status PENDING  Incomplete  Culture, blood (x 2)     Status: None (Preliminary result)   Collection Time: 08/26/23 10:30 PM   Specimen: BLOOD LEFT WRIST  Result Value Ref Range Status   Specimen Description BLOOD LEFT WRIST  Final   Special Requests   Final    BOTTLES DRAWN AEROBIC AND ANAEROBIC Blood Culture adequate volume   Culture   Final    NO GROWTH 4 DAYS Performed at [facility], [address], [place], [place] [address]    Report Status PENDING  Incomplete         Radiology Studies: [place] EKG SITE RITE  Result Date: 08/29/2023 If Site Rite image not attached, placement could not be confirmed due to current cardiac rhythm.       Scheduled Meds:  atorvastatin  80 mg Oral Daily   Chlorhexidine Gluconate Cloth  6 each Topical Q0600   dapagliflozin propanediol  10 mg Oral Daily   enoxaparin (LOVENOX) injection  40 mg Subcutaneous Q24H   hydrALAZINE  75 mg Oral TID   insulin aspart  0-5 Units Subcutaneous QHS   insulin aspart  0-9 Units Subcutaneous TID WC   isosorbide mononitrate  30 mg Oral Daily   pantoprazole  40 mg Oral BID   sacubitril-valsartan  1 tablet Oral BID   sodium chloride flush  10-40 mL Intracatheter Q12H   Continuous Infusions:  amiodarone 30 mg/hr (08/29/23 2254)     LOS: 3 days    Time spent: 35 minutes    Alayla Dethlefs Hoover Brunette, DO [facility]  If 7PM-7AM, please contact night-coverage [email or web address] 08/30/2023, 11:17 AM

## 2023-08-30 NOTE — Plan of Care (Signed)

## 2023-08-30 NOTE — Plan of Care (Signed)
  Problem: Education: Goal: Knowledge of General Education information will improve Description: Including pain rating scale, medication(s)/side effects and non-pharmacologic comfort measures Outcome: Progressing   Problem: Health Behavior/Discharge Planning: Goal: Ability to manage health-related needs will improve Outcome: Progressing   Problem: Clinical Measurements: Goal: Ability to maintain clinical measurements within normal limits will improve Outcome: Progressing Goal: Will remain free from infection Outcome: Progressing Goal: Diagnostic test results will improve Outcome: Progressing Goal: Respiratory complications will improve Outcome: Progressing Goal: Cardiovascular complication will be avoided Outcome: Progressing   Problem: Activity: Goal: Risk for activity intolerance will decrease Outcome: Progressing   Problem: Nutrition: Goal: Adequate nutrition will be maintained Outcome: Progressing   Problem: Coping: Goal: Level of anxiety will decrease Outcome: Progressing   Problem: Elimination: Goal: Will not experience complications related to bowel motility Outcome: Progressing Goal: Will not experience complications related to urinary retention Outcome: Progressing   Problem: Pain Managment: Goal: General experience of comfort will improve Outcome: Progressing   Problem: Safety: Goal: Ability to remain free from injury will improve Outcome: Progressing   Problem: Skin Integrity: Goal: Risk for impaired skin integrity will decrease Outcome: Progressing   Problem: Education: Goal: Ability to describe self-care measures that may prevent or decrease complications (Diabetes Survival Skills Education) will improve Outcome: Progressing Goal: Individualized Educational Video(s) Outcome: Progressing   Problem: Coping: Goal: Ability to adjust to condition or change in health will improve Outcome: Progressing   Problem: Fluid Volume: Goal: Ability to  maintain a balanced intake and output will improve Outcome: Progressing   Problem: Health Behavior/Discharge Planning: Goal: Ability to identify and utilize available resources and services will improve Outcome: Progressing Goal: Ability to manage health-related needs will improve Outcome: Progressing   Problem: Metabolic: Goal: Ability to maintain appropriate glucose levels will improve Outcome: Progressing   Problem: Nutritional: Goal: Maintenance of adequate nutrition will improve Outcome: Progressing Goal: Progress toward achieving an optimal weight will improve Outcome: Progressing   Problem: Skin Integrity: Goal: Risk for impaired skin integrity will decrease Outcome: Progressing   Problem: Tissue Perfusion: Goal: Adequacy of tissue perfusion will improve Outcome: Progressing   Problem: Fluid Volume: Goal: Hemodynamic stability will improve Outcome: Progressing   Problem: Clinical Measurements: Goal: Diagnostic test results will improve Outcome: Progressing Goal: Signs and symptoms of infection will decrease Outcome: Progressing   Problem: Respiratory: Goal: Ability to maintain adequate ventilation will improve Outcome: Progressing   

## 2023-08-31 DIAGNOSIS — I4892 Unspecified atrial flutter: Secondary | ICD-10-CM | POA: Diagnosis not present

## 2023-08-31 LAB — BASIC METABOLIC PANEL
Anion gap: 11 (ref 5–15)
BUN: 10 mg/dL (ref 8–23)
CO2: 24 mmol/L (ref 22–32)
Calcium: 7.8 mg/dL — ABNORMAL LOW (ref 8.9–10.3)
Chloride: 96 mmol/L — ABNORMAL LOW (ref 98–111)
Creatinine, Ser: 0.88 mg/dL (ref 0.44–1.00)
GFR, Estimated: >60 mL/min (ref >60)
Glucose, Bld: 173 mg/dL — ABNORMAL HIGH (ref 70–99)
Potassium: 3.7 mmol/L (ref 3.5–5.1)
Sodium: 131 mmol/L — ABNORMAL LOW (ref 135–145)

## 2023-08-31 LAB — CBC
HCT: 34.2 % — ABNORMAL LOW (ref 36.0–46.0)
Hemoglobin: 10.7 g/dL — ABNORMAL LOW (ref 12.0–15.0)
MCH: 28.9 pg (ref 26.0–34.0)
MCHC: 31.3 g/dL (ref 30.0–36.0)
MCV: 92.4 fL (ref 80.0–100.0)
Platelets: 231 10*3/uL (ref 150–400)
RBC: 3.7 MIL/uL — ABNORMAL LOW (ref 3.87–5.11)
RDW: 14.8 % (ref 11.5–15.5)
WBC: 13.6 10*3/uL — ABNORMAL HIGH (ref 4.0–10.5)
nRBC: 0 % (ref 0.0–0.2)

## 2023-08-31 LAB — GLUCOSE, CAPILLARY
Glucose-Capillary: 113 mg/dL — ABNORMAL HIGH (ref 70–99)
Glucose-Capillary: 101 mg/dL — ABNORMAL HIGH (ref 70–99)

## 2023-08-31 LAB — CULTURE, BLOOD (ROUTINE X 2)
Culture: NO GROWTH
Culture: NO GROWTH
Special Requests: ADEQUATE
Special Requests: ADEQUATE

## 2023-08-31 LAB — MAGNESIUM: Magnesium: 1.9 mg/dL (ref 1.7–2.4)

## 2023-08-31 MED ORDER — AMIODARONE HCL 200 MG PO TABS
200.0000 mg | ORAL_TABLET | Freq: Two times a day (BID) | ORAL | Status: DC
  Administered 2023-08-31: 200 mg via ORAL
  Filled 2023-08-31: qty 1

## 2023-08-31 MED ORDER — AMIODARONE HCL 200 MG PO TABS: 200.0000 mg | ORAL_TABLET | Freq: Two times a day (BID) | ORAL | 0 refills | Status: DC

## 2023-08-31 NOTE — Progress Notes (Addendum)
AVS gone over with the pt. All questions answered. Pt waiting for her daughter to arrive to take her home.  1510- Pt's daughter arrived and helped pt change clothes. NT took pt to the exit via wheelchair.

## 2023-08-31 NOTE — Discharge Summary (Signed)
Physician Discharge Summary  Michelle Morrison ZOX:[id number] DOB: 1961/11/09 DOA: 08/26/2023  PCP: [name], MD  Admit date: 08/26/2023  Discharge date: 08/31/2023  Admitted From:Home  Disposition:  Home  Recommendations for Outpatient Follow-up:  Follow up with PCP in 1-2 weeks Follow-up with cardiology/EP with referral sent Continue amiodarone 200 mg twice daily as recommended per cardiology Continue other home medications as noted below  Home Health: None  Equipment/Devices: None  Discharge Condition:Stable  CODE STATUS: Full  Diet recommendation: Heart Healthy  Brief/Interim Summary: Michelle Morrison is a 62 y.o. female with medical history significant of systolic heart failure, atrial tachycardia, gastroesophageal reflux disease and hypertension; who presented to the hospital with complaints of epigastric pain.  Patient found with a heart rate in the 160 range and concerns atrial flutter with RVR.  Patient started on amiodarone and cardiology following.  She she had GI symptoms which have resolved and CT abdominal imaging within normal limits.  2D echocardiogram with depressed LV function in the setting of ongoing tachycardia and cardiology maintain patient on IV amiodarone infusion.  She appears to have converted to sinus rhythm overnight on 10/5 and has heart rate in the 70-80 bpm range even with ambulation.  She denies any further symptomatology and appears to now be in stable condition for discharge on oral amiodarone with close outpatient follow-up.  She will remain on amiodarone 200 mg twice daily as recommended per cardiology on 10/4.  No other acute events or concerns noted.  Discharge Diagnoses:  Principal Problem:   Atrial flutter (HCC) Active Problems:   Diabetes mellitus type 2 with complications (HCC)   HTN (hypertension), benign   Hyperglycemia due to diabetes mellitus (HCC)   Epigastric pain   Chronic systolic CHF (congestive heart failure) (HCC)    Atrial tachycardia (HCC)   Atrial flutter with rapid ventricular response (HCC)  Principal discharge diagnosis: Atrial flutter with RVR.  Discharge Instructions  Discharge Instructions     Ambulatory referral to Cardiology   Complete by: As directed    Diet - low sodium heart healthy   Complete by: As directed    Increase activity slowly   Complete by: As directed       Allergies as of 08/31/2023   No Known Allergies      Medication List     STOP taking these medications    hydrALAZINE 50 MG tablet Commonly known as: APRESOLINE   Multaq 400 MG tablet Generic drug: dronedarone       TAKE these medications    albuterol 108 (90 Base) MCG/ACT inhaler Commonly known as: VENTOLIN HFA Inhale 1-2 puffs into the lungs every 4 (four) hours as needed.   amiodarone 200 MG tablet Commonly known as: PACERONE Take 1 tablet (200 mg total) by mouth 2 (two) times daily.   atorvastatin 80 MG tablet Commonly known as: LIPITOR Take 1 tablet (80 mg total) by mouth daily.   dapagliflozin propanediol 10 MG Tabs tablet Commonly known as: FARXIGA TAKE 1 TABLET (10 MG TOTAL) BY MOUTH DAILY.   diclofenac Sodium 1 % Gel Commonly known as: VOLTAREN Apply 4 g topically daily as needed (stomach pain).   isosorbide mononitrate 30 MG 24 hr tablet Commonly known as: IMDUR Take 1 tablet (30 mg total) by mouth daily.   sacubitril-valsartan 49-51 MG Commonly known as: ENTRESTO Take 1 tablet by mouth 2 (two) times daily.   Tradjenta 5 MG Tabs tablet Generic drug: linagliptin Take 5 mg by mouth daily.  Follow-up Information     [name], Wayland Salinas, MD. Schedule an appointment as soon as possible for a visit in 1 week(s).   Specialty: Internal Medicine Contact information: [address] [place] [address] [phone]         Antoine Poche, MD. Go to.   Specialty: Cardiology Contact information: [address] [place]  [address] [phone]                No Known Allergies  Consultations: Neurology   Procedures/Studies: [place] EKG SITE RITE  Result Date: 08/29/2023 If Site Rite image not attached, placement could not be confirmed due to current cardiac rhythm.  CT ABDOMEN PELVIS W CONTRAST  Result Date: 08/27/2023 CLINICAL DATA:  Acute abdominal and epigastric pain. Nausea and vomiting. EXAM: CT ABDOMEN AND PELVIS WITH CONTRAST TECHNIQUE: Multidetector CT imaging of the abdomen and pelvis was performed using the standard protocol following bolus administration of intravenous contrast. RADIATION DOSE REDUCTION: This exam was performed according to the departmental dose-optimization program which includes automated exposure control, adjustment of the mA and/or kV according to patient size and/or use of iterative reconstruction technique. CONTRAST:  80mL OMNIPAQUE IOHEXOL 300 MG/ML  SOLN COMPARISON:  02/17/2017 FINDINGS: Lower Chest: New small right pleural effusion and right lower lobe atelectasis. Hepatobiliary: No suspicious hepatic masses identified. Prior cholecystectomy. No evidence of biliary obstruction. Pancreas:  No mass or inflammatory changes. Spleen: Within normal limits in size and appearance. Adrenals/Urinary Tract: No suspicious masses identified. No evidence of ureteral calculi or hydronephrosis. Stomach/Bowel: No evidence of obstruction, inflammatory process or abnormal fluid collections. Diverticulosis is seen mainly involving the sigmoid colon, however there is no evidence of diverticulitis. Vascular/Lymphatic: No pathologically enlarged lymph nodes. No acute vascular findings. Reproductive:  No mass or other significant abnormality. Other:  None. Musculoskeletal:  No suspicious bone lesions identified. IMPRESSION: No acute findings within the abdomen or pelvis. New small right pleural effusion and right lower lobe atelectasis. Colonic diverticulosis, without radiographic evidence of  diverticulitis. Electronically Signed   By: Danae Orleans M.D.   On: 08/27/2023 16:40   ECHOCARDIOGRAM COMPLETE  Result Date: 08/27/2023    ECHOCARDIOGRAM REPORT   [name] Date of Exam: 08/27/2023 Medical Rec #:  [id number]      Height:       67.0 in Accession #:    [id number]     Weight:       174.6 lb Date of Birth:  Feb 14, 1961      BSA:          1.909 m Patient Age:    62 years       BP:           133/69 mmHg Patient Gender: F              HR:           74 bpm. [facility] Procedure: 2D Echo, Cardiac Doppler, Color Doppler and Intracardiac            Opacification Agent Indications:    Atrial Flutter I48.92  History:        Patient has prior history of Echocardiogram examinations.                 Arrythmias:Atrial Fibrillation; Risk Factors:Hypertension,                 Diabetes and Dyslipidemia.  Sonographer:    Celesta Gentile RCS Referring Phys: [phone]  Physician Discharge Summary  Michelle Morrison ZOX:[id number] DOB: 1961/11/09 DOA: 08/26/2023  PCP: [name], MD  Admit date: 08/26/2023  Discharge date: 08/31/2023  Admitted From:Home  Disposition:  Home  Recommendations for Outpatient Follow-up:  Follow up with PCP in 1-2 weeks Follow-up with cardiology/EP with referral sent Continue amiodarone 200 mg twice daily as recommended per cardiology Continue other home medications as noted below  Home Health: None  Equipment/Devices: None  Discharge Condition:Stable  CODE STATUS: Full  Diet recommendation: Heart Healthy  Brief/Interim Summary: Michelle Morrison is a 62 y.o. female with medical history significant of systolic heart failure, atrial tachycardia, gastroesophageal reflux disease and hypertension; who presented to the hospital with complaints of epigastric pain.  Patient found with a heart rate in the 160 range and concerns atrial flutter with RVR.  Patient started on amiodarone and cardiology following.  She she had GI symptoms which have resolved and CT abdominal imaging within normal limits.  2D echocardiogram with depressed LV function in the setting of ongoing tachycardia and cardiology maintain patient on IV amiodarone infusion.  She appears to have converted to sinus rhythm overnight on 10/5 and has heart rate in the 70-80 bpm range even with ambulation.  She denies any further symptomatology and appears to now be in stable condition for discharge on oral amiodarone with close outpatient follow-up.  She will remain on amiodarone 200 mg twice daily as recommended per cardiology on 10/4.  No other acute events or concerns noted.  Discharge Diagnoses:  Principal Problem:   Atrial flutter (HCC) Active Problems:   Diabetes mellitus type 2 with complications (HCC)   HTN (hypertension), benign   Hyperglycemia due to diabetes mellitus (HCC)   Epigastric pain   Chronic systolic CHF (congestive heart failure) (HCC)    Atrial tachycardia (HCC)   Atrial flutter with rapid ventricular response (HCC)  Principal discharge diagnosis: Atrial flutter with RVR.  Discharge Instructions  Discharge Instructions     Ambulatory referral to Cardiology   Complete by: As directed    Diet - low sodium heart healthy   Complete by: As directed    Increase activity slowly   Complete by: As directed       Allergies as of 08/31/2023   No Known Allergies      Medication List     STOP taking these medications    hydrALAZINE 50 MG tablet Commonly known as: APRESOLINE   Multaq 400 MG tablet Generic drug: dronedarone       TAKE these medications    albuterol 108 (90 Base) MCG/ACT inhaler Commonly known as: VENTOLIN HFA Inhale 1-2 puffs into the lungs every 4 (four) hours as needed.   amiodarone 200 MG tablet Commonly known as: PACERONE Take 1 tablet (200 mg total) by mouth 2 (two) times daily.   atorvastatin 80 MG tablet Commonly known as: LIPITOR Take 1 tablet (80 mg total) by mouth daily.   dapagliflozin propanediol 10 MG Tabs tablet Commonly known as: FARXIGA TAKE 1 TABLET (10 MG TOTAL) BY MOUTH DAILY.   diclofenac Sodium 1 % Gel Commonly known as: VOLTAREN Apply 4 g topically daily as needed (stomach pain).   isosorbide mononitrate 30 MG 24 hr tablet Commonly known as: IMDUR Take 1 tablet (30 mg total) by mouth daily.   sacubitril-valsartan 49-51 MG Commonly known as: ENTRESTO Take 1 tablet by mouth 2 (two) times daily.   Tradjenta 5 MG Tabs tablet Generic drug: linagliptin Take 5 mg by mouth daily.  Follow-up Information     [name], Wayland Salinas, MD. Schedule an appointment as soon as possible for a visit in 1 week(s).   Specialty: Internal Medicine Contact information: [address] [place] [address] [phone]         Antoine Poche, MD. Go to.   Specialty: Cardiology Contact information: [address] [place]  [address] [phone]                No Known Allergies  Consultations: Neurology   Procedures/Studies: [place] EKG SITE RITE  Result Date: 08/29/2023 If Site Rite image not attached, placement could not be confirmed due to current cardiac rhythm.  CT ABDOMEN PELVIS W CONTRAST  Result Date: 08/27/2023 CLINICAL DATA:  Acute abdominal and epigastric pain. Nausea and vomiting. EXAM: CT ABDOMEN AND PELVIS WITH CONTRAST TECHNIQUE: Multidetector CT imaging of the abdomen and pelvis was performed using the standard protocol following bolus administration of intravenous contrast. RADIATION DOSE REDUCTION: This exam was performed according to the departmental dose-optimization program which includes automated exposure control, adjustment of the mA and/or kV according to patient size and/or use of iterative reconstruction technique. CONTRAST:  80mL OMNIPAQUE IOHEXOL 300 MG/ML  SOLN COMPARISON:  02/17/2017 FINDINGS: Lower Chest: New small right pleural effusion and right lower lobe atelectasis. Hepatobiliary: No suspicious hepatic masses identified. Prior cholecystectomy. No evidence of biliary obstruction. Pancreas:  No mass or inflammatory changes. Spleen: Within normal limits in size and appearance. Adrenals/Urinary Tract: No suspicious masses identified. No evidence of ureteral calculi or hydronephrosis. Stomach/Bowel: No evidence of obstruction, inflammatory process or abnormal fluid collections. Diverticulosis is seen mainly involving the sigmoid colon, however there is no evidence of diverticulitis. Vascular/Lymphatic: No pathologically enlarged lymph nodes. No acute vascular findings. Reproductive:  No mass or other significant abnormality. Other:  None. Musculoskeletal:  No suspicious bone lesions identified. IMPRESSION: No acute findings within the abdomen or pelvis. New small right pleural effusion and right lower lobe atelectasis. Colonic diverticulosis, without radiographic evidence of  diverticulitis. Electronically Signed   By: Danae Orleans M.D.   On: 08/27/2023 16:40   ECHOCARDIOGRAM COMPLETE  Result Date: 08/27/2023    ECHOCARDIOGRAM REPORT   [name] Date of Exam: 08/27/2023 Medical Rec #:  [id number]      Height:       67.0 in Accession #:    [id number]     Weight:       174.6 lb Date of Birth:  Feb 14, 1961      BSA:          1.909 m Patient Age:    62 years       BP:           133/69 mmHg Patient Gender: F              HR:           74 bpm. [facility] Procedure: 2D Echo, Cardiac Doppler, Color Doppler and Intracardiac            Opacification Agent Indications:    Atrial Flutter I48.92  History:        Patient has prior history of Echocardiogram examinations.                 Arrythmias:Atrial Fibrillation; Risk Factors:Hypertension,                 Diabetes and Dyslipidemia.  Sonographer:    Celesta Gentile RCS Referring Phys: [phone]  Follow-up Information     [name], Wayland Salinas, MD. Schedule an appointment as soon as possible for a visit in 1 week(s).   Specialty: Internal Medicine Contact information: [address] [place] [address] [phone]         Antoine Poche, MD. Go to.   Specialty: Cardiology Contact information: [address] [place]  [address] [phone]                No Known Allergies  Consultations: Neurology   Procedures/Studies: [place] EKG SITE RITE  Result Date: 08/29/2023 If Site Rite image not attached, placement could not be confirmed due to current cardiac rhythm.  CT ABDOMEN PELVIS W CONTRAST  Result Date: 08/27/2023 CLINICAL DATA:  Acute abdominal and epigastric pain. Nausea and vomiting. EXAM: CT ABDOMEN AND PELVIS WITH CONTRAST TECHNIQUE: Multidetector CT imaging of the abdomen and pelvis was performed using the standard protocol following bolus administration of intravenous contrast. RADIATION DOSE REDUCTION: This exam was performed according to the departmental dose-optimization program which includes automated exposure control, adjustment of the mA and/or kV according to patient size and/or use of iterative reconstruction technique. CONTRAST:  80mL OMNIPAQUE IOHEXOL 300 MG/ML  SOLN COMPARISON:  02/17/2017 FINDINGS: Lower Chest: New small right pleural effusion and right lower lobe atelectasis. Hepatobiliary: No suspicious hepatic masses identified. Prior cholecystectomy. No evidence of biliary obstruction. Pancreas:  No mass or inflammatory changes. Spleen: Within normal limits in size and appearance. Adrenals/Urinary Tract: No suspicious masses identified. No evidence of ureteral calculi or hydronephrosis. Stomach/Bowel: No evidence of obstruction, inflammatory process or abnormal fluid collections. Diverticulosis is seen mainly involving the sigmoid colon, however there is no evidence of diverticulitis. Vascular/Lymphatic: No pathologically enlarged lymph nodes. No acute vascular findings. Reproductive:  No mass or other significant abnormality. Other:  None. Musculoskeletal:  No suspicious bone lesions identified. IMPRESSION: No acute findings within the abdomen or pelvis. New small right pleural effusion and right lower lobe atelectasis. Colonic diverticulosis, without radiographic evidence of  diverticulitis. Electronically Signed   By: Danae Orleans M.D.   On: 08/27/2023 16:40   ECHOCARDIOGRAM COMPLETE  Result Date: 08/27/2023    ECHOCARDIOGRAM REPORT   [name] Date of Exam: 08/27/2023 Medical Rec #:  [id number]      Height:       67.0 in Accession #:    [id number]     Weight:       174.6 lb Date of Birth:  Feb 14, 1961      BSA:          1.909 m Patient Age:    62 years       BP:           133/69 mmHg Patient Gender: F              HR:           74 bpm. [facility] Procedure: 2D Echo, Cardiac Doppler, Color Doppler and Intracardiac            Opacification Agent Indications:    Atrial Flutter I48.92  History:        Patient has prior history of Echocardiogram examinations.                 Arrythmias:Atrial Fibrillation; Risk Factors:Hypertension,                 Diabetes and Dyslipidemia.  Sonographer:    Celesta Gentile RCS Referring Phys: [phone]  Physician Discharge Summary  Michelle Morrison ZOX:[id number] DOB: 1961/11/09 DOA: 08/26/2023  PCP: [name], MD  Admit date: 08/26/2023  Discharge date: 08/31/2023  Admitted From:Home  Disposition:  Home  Recommendations for Outpatient Follow-up:  Follow up with PCP in 1-2 weeks Follow-up with cardiology/EP with referral sent Continue amiodarone 200 mg twice daily as recommended per cardiology Continue other home medications as noted below  Home Health: None  Equipment/Devices: None  Discharge Condition:Stable  CODE STATUS: Full  Diet recommendation: Heart Healthy  Brief/Interim Summary: Michelle Morrison is a 62 y.o. female with medical history significant of systolic heart failure, atrial tachycardia, gastroesophageal reflux disease and hypertension; who presented to the hospital with complaints of epigastric pain.  Patient found with a heart rate in the 160 range and concerns atrial flutter with RVR.  Patient started on amiodarone and cardiology following.  She she had GI symptoms which have resolved and CT abdominal imaging within normal limits.  2D echocardiogram with depressed LV function in the setting of ongoing tachycardia and cardiology maintain patient on IV amiodarone infusion.  She appears to have converted to sinus rhythm overnight on 10/5 and has heart rate in the 70-80 bpm range even with ambulation.  She denies any further symptomatology and appears to now be in stable condition for discharge on oral amiodarone with close outpatient follow-up.  She will remain on amiodarone 200 mg twice daily as recommended per cardiology on 10/4.  No other acute events or concerns noted.  Discharge Diagnoses:  Principal Problem:   Atrial flutter (HCC) Active Problems:   Diabetes mellitus type 2 with complications (HCC)   HTN (hypertension), benign   Hyperglycemia due to diabetes mellitus (HCC)   Epigastric pain   Chronic systolic CHF (congestive heart failure) (HCC)    Atrial tachycardia (HCC)   Atrial flutter with rapid ventricular response (HCC)  Principal discharge diagnosis: Atrial flutter with RVR.  Discharge Instructions  Discharge Instructions     Ambulatory referral to Cardiology   Complete by: As directed    Diet - low sodium heart healthy   Complete by: As directed    Increase activity slowly   Complete by: As directed       Allergies as of 08/31/2023   No Known Allergies      Medication List     STOP taking these medications    hydrALAZINE 50 MG tablet Commonly known as: APRESOLINE   Multaq 400 MG tablet Generic drug: dronedarone       TAKE these medications    albuterol 108 (90 Base) MCG/ACT inhaler Commonly known as: VENTOLIN HFA Inhale 1-2 puffs into the lungs every 4 (four) hours as needed.   amiodarone 200 MG tablet Commonly known as: PACERONE Take 1 tablet (200 mg total) by mouth 2 (two) times daily.   atorvastatin 80 MG tablet Commonly known as: LIPITOR Take 1 tablet (80 mg total) by mouth daily.   dapagliflozin propanediol 10 MG Tabs tablet Commonly known as: FARXIGA TAKE 1 TABLET (10 MG TOTAL) BY MOUTH DAILY.   diclofenac Sodium 1 % Gel Commonly known as: VOLTAREN Apply 4 g topically daily as needed (stomach pain).   isosorbide mononitrate 30 MG 24 hr tablet Commonly known as: IMDUR Take 1 tablet (30 mg total) by mouth daily.   sacubitril-valsartan 49-51 MG Commonly known as: ENTRESTO Take 1 tablet by mouth 2 (two) times daily.   Tradjenta 5 MG Tabs tablet Generic drug: linagliptin Take 5 mg by mouth daily.  Physician Discharge Summary  Michelle Morrison ZOX:[id number] DOB: 1961/11/09 DOA: 08/26/2023  PCP: [name], MD  Admit date: 08/26/2023  Discharge date: 08/31/2023  Admitted From:Home  Disposition:  Home  Recommendations for Outpatient Follow-up:  Follow up with PCP in 1-2 weeks Follow-up with cardiology/EP with referral sent Continue amiodarone 200 mg twice daily as recommended per cardiology Continue other home medications as noted below  Home Health: None  Equipment/Devices: None  Discharge Condition:Stable  CODE STATUS: Full  Diet recommendation: Heart Healthy  Brief/Interim Summary: Michelle Morrison is a 62 y.o. female with medical history significant of systolic heart failure, atrial tachycardia, gastroesophageal reflux disease and hypertension; who presented to the hospital with complaints of epigastric pain.  Patient found with a heart rate in the 160 range and concerns atrial flutter with RVR.  Patient started on amiodarone and cardiology following.  She she had GI symptoms which have resolved and CT abdominal imaging within normal limits.  2D echocardiogram with depressed LV function in the setting of ongoing tachycardia and cardiology maintain patient on IV amiodarone infusion.  She appears to have converted to sinus rhythm overnight on 10/5 and has heart rate in the 70-80 bpm range even with ambulation.  She denies any further symptomatology and appears to now be in stable condition for discharge on oral amiodarone with close outpatient follow-up.  She will remain on amiodarone 200 mg twice daily as recommended per cardiology on 10/4.  No other acute events or concerns noted.  Discharge Diagnoses:  Principal Problem:   Atrial flutter (HCC) Active Problems:   Diabetes mellitus type 2 with complications (HCC)   HTN (hypertension), benign   Hyperglycemia due to diabetes mellitus (HCC)   Epigastric pain   Chronic systolic CHF (congestive heart failure) (HCC)    Atrial tachycardia (HCC)   Atrial flutter with rapid ventricular response (HCC)  Principal discharge diagnosis: Atrial flutter with RVR.  Discharge Instructions  Discharge Instructions     Ambulatory referral to Cardiology   Complete by: As directed    Diet - low sodium heart healthy   Complete by: As directed    Increase activity slowly   Complete by: As directed       Allergies as of 08/31/2023   No Known Allergies      Medication List     STOP taking these medications    hydrALAZINE 50 MG tablet Commonly known as: APRESOLINE   Multaq 400 MG tablet Generic drug: dronedarone       TAKE these medications    albuterol 108 (90 Base) MCG/ACT inhaler Commonly known as: VENTOLIN HFA Inhale 1-2 puffs into the lungs every 4 (four) hours as needed.   amiodarone 200 MG tablet Commonly known as: PACERONE Take 1 tablet (200 mg total) by mouth 2 (two) times daily.   atorvastatin 80 MG tablet Commonly known as: LIPITOR Take 1 tablet (80 mg total) by mouth daily.   dapagliflozin propanediol 10 MG Tabs tablet Commonly known as: FARXIGA TAKE 1 TABLET (10 MG TOTAL) BY MOUTH DAILY.   diclofenac Sodium 1 % Gel Commonly known as: VOLTAREN Apply 4 g topically daily as needed (stomach pain).   isosorbide mononitrate 30 MG 24 hr tablet Commonly known as: IMDUR Take 1 tablet (30 mg total) by mouth daily.   sacubitril-valsartan 49-51 MG Commonly known as: ENTRESTO Take 1 tablet by mouth 2 (two) times daily.   Tradjenta 5 MG Tabs tablet Generic drug: linagliptin Take 5 mg by mouth daily.

## 2023-09-08 ENCOUNTER — Encounter (HOSPITAL_COMMUNITY): Payer: Self-pay

## 2023-09-08 ENCOUNTER — Telehealth: Payer: Self-pay | Admitting: Cardiology

## 2023-09-08 ENCOUNTER — Ambulatory Visit (HOSPITAL_COMMUNITY)
Admission: RE | Admit: 2023-09-08 | Discharge: 2023-09-08 | Disposition: A | Discharge status: Home / Self Care | Payer: 59 | Source: Ambulatory Visit | Attending: Internal Medicine | Admitting: Internal Medicine

## 2023-09-08 DIAGNOSIS — Z1231 Encounter for screening mammogram for malignant neoplasm of breast: Secondary | ICD-10-CM | POA: Diagnosis not present

## 2023-09-08 MED ORDER — AMIODARONE HCL 200 MG PO TABS: 200.0000 mg | ORAL_TABLET | Freq: Every day | ORAL | 3 refills | Status: DC

## 2023-09-08 NOTE — Telephone Encounter (Signed)
Left message on listed number to reduce Amiodarone dose from bid to once a day dosing and keep follow up as planned.

## 2023-09-08 NOTE — Telephone Encounter (Signed)
Patient needs to speak with pcp about nebulizers   I will forward to dr.Branch for recommendations

## 2023-09-08 NOTE — Telephone Encounter (Signed)
Had been on amio previously and tolerated. Can lower the dose to 200mg  daily and keep her f/u  Dominga Ferry MD

## 2023-09-08 NOTE — Telephone Encounter (Signed)
Patient returned RN's call and wants to know next steps regarding her SOB.

## 2023-09-08 NOTE — Telephone Encounter (Signed)
Pt came in office today and stated that she is experiencing SOB, weakness, hot flashes, getting tired quickly when walking, and just overall feeling bad. The pt believes some of this is caused by the amiodarone Dr. Wyline Mood just put her on. She stated that she thinks she is taking too much. Pt is scheduled with Jacolyn Reedy 09/29/23, she requested sooner appt with Dr. Wyline Mood and this is the first availability. I have added the pt to the wait list. Daughter brought her in today and stated that she has given her a breathing treatment with a machine of her's and used the medicine to go with it. She is not sure the name. She stated that this has helped the pt. They are wanting to know if she can get a prescription for this medicine for herself. [phone] is best number to reach the pt.

## 2023-09-08 NOTE — Telephone Encounter (Signed)
Ms.Covin is going to speak with Dr.[name] regarding the need for a nebulizer machine.

## 2023-09-09 ENCOUNTER — Inpatient Hospital Stay (HOSPITAL_COMMUNITY)
Admission: EM | Admit: 2023-09-09 | Discharge: 2023-09-13 | DRG: 291 | Disposition: A | Discharge status: Home / Self Care | Payer: 59 | Attending: Family Medicine | Admitting: Family Medicine

## 2023-09-09 ENCOUNTER — Encounter (HOSPITAL_COMMUNITY): Payer: Self-pay

## 2023-09-09 ENCOUNTER — Other Ambulatory Visit: Payer: Self-pay

## 2023-09-09 ENCOUNTER — Emergency Department (HOSPITAL_COMMUNITY): Payer: 59

## 2023-09-09 DIAGNOSIS — I5043 Acute on chronic combined systolic (congestive) and diastolic (congestive) heart failure: Secondary | ICD-10-CM | POA: Diagnosis not present

## 2023-09-09 DIAGNOSIS — I4719 Other supraventricular tachycardia: Secondary | ICD-10-CM | POA: Diagnosis present

## 2023-09-09 DIAGNOSIS — E118 Type 2 diabetes mellitus with unspecified complications: Secondary | ICD-10-CM | POA: Diagnosis not present

## 2023-09-09 DIAGNOSIS — E785 Hyperlipidemia, unspecified: Secondary | ICD-10-CM | POA: Diagnosis present

## 2023-09-09 DIAGNOSIS — Z833 Family history of diabetes mellitus: Secondary | ICD-10-CM

## 2023-09-09 DIAGNOSIS — N1831 Chronic kidney disease, stage 3a: Secondary | ICD-10-CM | POA: Diagnosis not present

## 2023-09-09 DIAGNOSIS — R0989 Other specified symptoms and signs involving the circulatory and respiratory systems: Secondary | ICD-10-CM | POA: Diagnosis not present

## 2023-09-09 DIAGNOSIS — Z79899 Other long term (current) drug therapy: Secondary | ICD-10-CM | POA: Diagnosis not present

## 2023-09-09 DIAGNOSIS — I447 Left bundle-branch block, unspecified: Secondary | ICD-10-CM | POA: Diagnosis not present

## 2023-09-09 DIAGNOSIS — Z8249 Family history of ischemic heart disease and other diseases of the circulatory system: Secondary | ICD-10-CM | POA: Diagnosis not present

## 2023-09-09 DIAGNOSIS — Z7984 Long term (current) use of oral hypoglycemic drugs: Secondary | ICD-10-CM

## 2023-09-09 DIAGNOSIS — I495 Sick sinus syndrome: Secondary | ICD-10-CM | POA: Diagnosis present

## 2023-09-09 DIAGNOSIS — I130 Hypertensive heart and chronic kidney disease with heart failure and stage 1 through stage 4 chronic kidney disease, or unspecified chronic kidney disease: Secondary | ICD-10-CM | POA: Diagnosis not present

## 2023-09-09 DIAGNOSIS — I509 Heart failure, unspecified: Secondary | ICD-10-CM | POA: Diagnosis not present

## 2023-09-09 DIAGNOSIS — I4891 Unspecified atrial fibrillation: Secondary | ICD-10-CM | POA: Diagnosis not present

## 2023-09-09 DIAGNOSIS — R000 Tachycardia, unspecified: Secondary | ICD-10-CM

## 2023-09-09 DIAGNOSIS — J90 Pleural effusion, not elsewhere classified: Secondary | ICD-10-CM | POA: Diagnosis not present

## 2023-09-09 DIAGNOSIS — R918 Other nonspecific abnormal finding of lung field: Secondary | ICD-10-CM | POA: Diagnosis not present

## 2023-09-09 DIAGNOSIS — Z9049 Acquired absence of other specified parts of digestive tract: Secondary | ICD-10-CM

## 2023-09-09 DIAGNOSIS — Z5986 Financial insecurity: Secondary | ICD-10-CM

## 2023-09-09 DIAGNOSIS — I428 Other cardiomyopathies: Secondary | ICD-10-CM | POA: Diagnosis present

## 2023-09-09 DIAGNOSIS — E1122 Type 2 diabetes mellitus with diabetic chronic kidney disease: Secondary | ICD-10-CM | POA: Diagnosis present

## 2023-09-09 DIAGNOSIS — I5023 Acute on chronic systolic (congestive) heart failure: Secondary | ICD-10-CM | POA: Diagnosis present

## 2023-09-09 DIAGNOSIS — I160 Hypertensive urgency: Secondary | ICD-10-CM | POA: Diagnosis not present

## 2023-09-09 DIAGNOSIS — B009 Herpesviral infection, unspecified: Secondary | ICD-10-CM | POA: Diagnosis present

## 2023-09-09 DIAGNOSIS — I10 Essential (primary) hypertension: Secondary | ICD-10-CM | POA: Diagnosis not present

## 2023-09-09 DIAGNOSIS — I110 Hypertensive heart disease with heart failure: Secondary | ICD-10-CM | POA: Diagnosis not present

## 2023-09-09 LAB — BASIC METABOLIC PANEL
Anion gap: 12 (ref 5–15)
BUN: 22 mg/dL (ref 8–23)
CO2: 23 mmol/L (ref 22–32)
Calcium: 8.6 mg/dL — ABNORMAL LOW (ref 8.9–10.3)
Chloride: 101 mmol/L (ref 98–111)
Creatinine, Ser: 1.24 mg/dL — ABNORMAL HIGH (ref 0.44–1.00)
GFR, Estimated: 50 mL/min — ABNORMAL LOW (ref >60)
Glucose, Bld: 118 mg/dL — ABNORMAL HIGH (ref 70–99)
Potassium: 4.2 mmol/L (ref 3.5–5.1)
Sodium: 136 mmol/L (ref 135–145)

## 2023-09-09 LAB — CBC
HCT: 35.8 % — ABNORMAL LOW (ref 36.0–46.0)
Hemoglobin: 11.6 g/dL — ABNORMAL LOW (ref 12.0–15.0)
MCH: 29.7 pg (ref 26.0–34.0)
MCHC: 32.4 g/dL (ref 30.0–36.0)
MCV: 91.6 fL (ref 80.0–100.0)
Platelets: 451 10*3/uL — ABNORMAL HIGH (ref 150–400)
RBC: 3.91 MIL/uL (ref 3.87–5.11)
RDW: 15.8 % — ABNORMAL HIGH (ref 11.5–15.5)
WBC: 6.3 10*3/uL (ref 4.0–10.5)
nRBC: 0 % (ref 0.0–0.2)

## 2023-09-09 LAB — TROPONIN I (HIGH SENSITIVITY)
Troponin I (High Sensitivity): 25 ng/L — ABNORMAL HIGH (ref <18)
Troponin I (High Sensitivity): 31 ng/L — ABNORMAL HIGH (ref <18)

## 2023-09-09 LAB — GLUCOSE, CAPILLARY: Glucose-Capillary: 152 mg/dL — ABNORMAL HIGH (ref 70–99)

## 2023-09-09 LAB — TSH: TSH: 3.479 u[IU]/mL (ref 0.350–4.500)

## 2023-09-09 LAB — BRAIN NATRIURETIC PEPTIDE: B Natriuretic Peptide: 3202 pg/mL — ABNORMAL HIGH (ref 0.0–100.0)

## 2023-09-09 MED ORDER — LACTATED RINGERS IV BOLUS: 1000.0000 mL | Freq: Once | INTRAVENOUS | Status: DC

## 2023-09-09 MED ORDER — ENOXAPARIN SODIUM 40 MG/0.4ML IJ SOSY
40.0000 mg | PREFILLED_SYRINGE | INTRAMUSCULAR | Status: DC
  Administered 2023-09-10 – 2023-09-12 (×3): 40 mg via SUBCUTANEOUS
  Filled 2023-09-09 (×4): qty 0.4

## 2023-09-09 MED ORDER — ACETAMINOPHEN 650 MG RE SUPP: 650.0000 mg | Freq: Four times a day (QID) | RECTAL | Status: DC | PRN

## 2023-09-09 MED ORDER — SACUBITRIL-VALSARTAN 49-51 MG PO TABS
1.0000 | ORAL_TABLET | Freq: Two times a day (BID) | ORAL | Status: DC
  Administered 2023-09-09 – 2023-09-11 (×4): 1 via ORAL
  Filled 2023-09-09 (×4): qty 1

## 2023-09-09 MED ORDER — ALBUTEROL SULFATE (2.5 MG/3ML) 0.083% IN NEBU: 3.0000 mL | INHALATION_SOLUTION | RESPIRATORY_TRACT | Status: DC | PRN

## 2023-09-09 MED ORDER — POLYETHYLENE GLYCOL 3350 17 G PO PACK: 17.0000 g | PACK | Freq: Every day | ORAL | Status: DC | PRN

## 2023-09-09 MED ORDER — INSULIN ASPART 100 UNIT/ML IJ SOLN
0.0000 [IU] | Freq: Three times a day (TID) | INTRAMUSCULAR | Status: DC
  Administered 2023-09-10 – 2023-09-12 (×3): 1 [IU] via SUBCUTANEOUS

## 2023-09-09 MED ORDER — FUROSEMIDE 10 MG/ML IJ SOLN
40.0000 mg | Freq: Two times a day (BID) | INTRAMUSCULAR | Status: DC
  Administered 2023-09-10 – 2023-09-12 (×6): 40 mg via INTRAVENOUS
  Filled 2023-09-09 (×6): qty 4

## 2023-09-09 MED ORDER — ACETAMINOPHEN 325 MG PO TABS: 650.0000 mg | ORAL_TABLET | Freq: Four times a day (QID) | ORAL | Status: DC | PRN

## 2023-09-09 MED ORDER — ONDANSETRON HCL 4 MG PO TABS: 4.0000 mg | ORAL_TABLET | Freq: Four times a day (QID) | ORAL | Status: DC | PRN

## 2023-09-09 MED ORDER — LABETALOL HCL 5 MG/ML IV SOLN: 10.0000 mg | INTRAVENOUS | Status: DC | PRN

## 2023-09-09 MED ORDER — HYDRALAZINE HCL 20 MG/ML IJ SOLN
10.0000 mg | Freq: Four times a day (QID) | INTRAMUSCULAR | Status: DC | PRN
  Filled 2023-09-09: qty 1

## 2023-09-09 MED ORDER — INSULIN ASPART 100 UNIT/ML IJ SOLN: 0.0000 [IU] | Freq: Every day | INTRAMUSCULAR | Status: DC

## 2023-09-09 MED ORDER — FUROSEMIDE 10 MG/ML IJ SOLN
20.0000 mg | Freq: Once | INTRAMUSCULAR | Status: AC
  Administered 2023-09-09: 20 mg via INTRAVENOUS
  Filled 2023-09-09: qty 2

## 2023-09-09 MED ORDER — ONDANSETRON HCL 4 MG/2ML IJ SOLN: 4.0000 mg | Freq: Four times a day (QID) | INTRAMUSCULAR | Status: DC | PRN

## 2023-09-09 MED ORDER — AMIODARONE HCL 200 MG PO TABS
200.0000 mg | ORAL_TABLET | Freq: Every day | ORAL | Status: DC
  Administered 2023-09-10 – 2023-09-13 (×4): 200 mg via ORAL
  Filled 2023-09-09 (×4): qty 1

## 2023-09-09 MED ORDER — ISOSORBIDE MONONITRATE ER 30 MG PO TB24
30.0000 mg | ORAL_TABLET | Freq: Every day | ORAL | Status: DC
  Administered 2023-09-10 – 2023-09-13 (×4): 30 mg via ORAL
  Filled 2023-09-09 (×4): qty 1

## 2023-09-09 MED ORDER — ATORVASTATIN CALCIUM 80 MG PO TABS
80.0000 mg | ORAL_TABLET | Freq: Every day | ORAL | Status: DC
  Administered 2023-09-10 – 2023-09-13 (×4): 80 mg via ORAL
  Filled 2023-09-09: qty 2
  Filled 2023-09-09 (×3): qty 1

## 2023-09-09 NOTE — Assessment & Plan Note (Signed)
Chest x-ray shows central pulmonary vascular congestion, right lower base opacity-atelectasis or pneumonia.  BNP markedly elevated at 3202.   recent echo 10/2-EF of 15 to 20% with grade 3 DD (restrictive).  Reports baseline weight 160s, today weight 177. -IV Lasix 40 twice daily -Cardiology consult in a.m. -Input output, daily weights, daily BMP

## 2023-09-09 NOTE — ED Triage Notes (Signed)
Pt stated that her HR has been up and down Seen for AFIB recently  SOB comes and goes HTN lately   Pt denies fluttering or pain in her chest.

## 2023-09-09 NOTE — H&P (Signed)
History and Physical    Michelle Morrison:[id number] DOB: 08-Jun-1961 DOA: 09/09/2023  PCP: [name], MD   Patient coming from: Home  I have personally briefly reviewed patient's old medical records in [facility]  Chief Complaint: Racing heart rate  HPI: Michelle Morrison is a 62 y.o. female with medical history significant for atrial fibrillation, hypertension, diabetes mellitus, systolic CHF, atrial tachycardia. Patient presented to the ED with complaints of feeling her heart rate "going up and down".  She denies chest pain, reports difficulty breathing with exertion , no cough, no fevers no chills.  No lower extremity swelling, but she feels she has gained weight.  She reports her baseline weight is about 163 pounds and she is weighing 177 pounds today.  Recent hospitalization  10/1 to 10/6-heart rate initially in the 160s, started on amiodarone, evaluated by cardiology.  Discharged on amiodarone 200 mg twice daily.  Patient reports she was taking the medication as prescribed since discharge, but then she called her cardiologist office because of difficulty breathing, weakness, hot flashes, easy fatigability and feeling bad overall.  It was recommended that she reduce her dose of amiodarone to 200 mg daily.  She started taking the reduced dose yesterday, and today she started having symptoms of racing heart rate.  ED Course: Tachycardic heart rate 112-125.  Respirate rate 20-28.  Temperature 98.  Blood pressure systolic 150s to 811B.  O2 sats greater than 93% on room air. BNP elevated at 3202.  Troponin 25 Chest x-ray with central pulmonary vascular congestion, right lower base opacity atelectasis or pneumonia. EDP to cardiology, requested admission here at [facility]-blood pressure and heart rate control. IV Lasix 20 mg x 1 given.  Review of Systems: As per HPI all other systems reviewed and negative.  Past Medical History:  Diagnosis Date   Atrial tachycardia  (HCC)    CHF (congestive heart failure) (HCC)    a. EF 45% by echo in 03/2020 with normal cors by cath   Diabetes mellitus without complication (HCC)    Diarrhea    Heart disease    Herpes simplex infection    Hyperlipidemia    Hypertension     Past Surgical History:  Procedure Laterality Date   CESAREAN SECTION     CHOLECYSTECTOMY     COLONOSCOPY N/A 08/26/2014   Procedure: COLONOSCOPY;  Surgeon: West Bali, MD;  Location: [facility];  Service: Endoscopy;  Laterality: N/A;  9:30 AM - moved to 8:30 - Ginger to notify pt   LEFT HEART CATH AND CORONARY ANGIOGRAPHY N/A 04/14/2020   Procedure: LEFT HEART CATH AND CORONARY ANGIOGRAPHY;  Surgeon: Swaziland, [name], MD;  Location: [facility] INVASIVE CV LAB;  Service: Cardiovascular;  Laterality: N/A;   RIGHT/LEFT HEART CATH AND CORONARY ANGIOGRAPHY N/A 02/19/2021   Procedure: RIGHT/LEFT HEART CATH AND CORONARY ANGIOGRAPHY;  Surgeon: Laurey Morale, MD;  Location: [facility] INVASIVE CV LAB;  Service: Cardiovascular;  Laterality: N/A;     reports that she has never smoked. She has never used smokeless tobacco. She reports that she does not drink alcohol and does not use drugs.  No Known Allergies  Family History  Problem Relation Age of Onset   Hypertension Mother    Diabetes Mother    Colon cancer Neg Hx     Prior to Admission medications   Medication Sig Start Date End Date Taking? Authorizing Provider  albuterol (VENTOLIN HFA) 108 (90 Base) MCG/ACT inhaler Inhale 1-2 puffs into the lungs  every 4 (four) hours as needed for wheezing or shortness of breath. 05/30/20  Yes [provider]  amiodarone (PACERONE) 200 MG tablet Take 1 tablet (200 mg total) by mouth daily. 09/08/23  Yes BranchDorothe Pea, MD  atorvastatin (LIPITOR) 80 MG tablet Take 1 tablet (80 mg total) by mouth daily. 03/14/21  Yes BranchDorothe Pea, MD  dapagliflozin propanediol (FARXIGA) 10 MG TABS tablet TAKE 1 TABLET (10 MG TOTAL) BY MOUTH DAILY. 03/14/21  Yes Laurey Morale, MD  diclofenac Sodium (VOLTAREN) 1 % GEL Apply 4 g topically daily as needed (stomach pain). 04/22/23  Yes [provider]  isosorbide mononitrate (IMDUR) 30 MG 24 hr tablet Take 1 tablet (30 mg total) by mouth daily. 01/09/23 01/04/24 Yes Branch, Dorothe Pea, MD  sacubitril-valsartan (ENTRESTO) 49-51 MG Take 1 tablet by mouth 2 (two) times daily. 08/19/23  Yes Branch, Dorothe Pea, MD  TRADJENTA 5 MG TABS tablet Take 5 mg by mouth daily. 08/12/22  Yes [provider]    Physical Exam: Vitals:   09/09/23 1138 09/09/23 1715 09/09/23 1830 09/09/23 1845  BP: (!) 182/111 (!) 155/102 (!) 170/114 (!) 161/109  Pulse: (!) 125 (!) 116 (!) 115 (!) 113  Resp: (!) 24 (!) 22 (!) 28 (!) 21  Temp: 98 F (36.7 C)     TempSrc: Oral     SpO2: 97% 95% 94% 93%  Weight:      Height:        Constitutional: NAD, calm, comfortable Vitals:   09/09/23 1138 09/09/23 1715 09/09/23 1830 09/09/23 1845  BP: (!) 182/111 (!) 155/102 (!) 170/114 (!) 161/109  Pulse: (!) 125 (!) 116 (!) 115 (!) 113  Resp: (!) 24 (!) 22 (!) 28 (!) 21  Temp: 98 F (36.7 C)     TempSrc: Oral     SpO2: 97% 95% 94% 93%  Weight:      Height:       Eyes: PERRL, lids and conjunctivae normal ENMT: Mucous membranes are moist.  Neck: normal, supple, no masses, no thyromegaly Respiratory: clear to auscultation bilaterally, no wheezing, no crackles. Normal respiratory effort. No accessory muscle use.  Cardiovascular: Regular rate and rhythm, no murmurs / rubs / gallops. No extremity edema.  Extremities warm Abdomen: no tenderness, no masses palpated. No hepatosplenomegaly.  Musculoskeletal: no clubbing / cyanosis. No joint deformity upper and lower extremities.  Skin: no rashes, lesions, ulcers. No induration Neurologic: No facial symmetry, moving extremities spontaneously. Speech Fluent. Psychiatric: Normal judgment and insight. Alert and oriented x 3. Normal mood.   Labs on Admission: I have personally reviewed  following labs and imaging studies  CBC: Recent Labs  Lab 09/09/23 1214  WBC 6.3  HGB 11.6*  HCT 35.8*  MCV 91.6  PLT 451*   Basic Metabolic Panel: Recent Labs  Lab 09/09/23 1214  NA 136  K 4.2  CL 101  CO2 23  GLUCOSE 118*  BUN 22  CREATININE 1.24*  CALCIUM 8.6*   Thyroid Function Tests: Recent Labs    09/09/23 1750  TSH 3.479   Anemia Panel: No results for input(s): "VITAMINB12", "FOLATE", "FERRITIN", "TIBC", "IRON", "RETICCTPCT" in the last 72 hours. Urine analysis:    Component Value Date/Time   COLORURINE YELLOW 08/26/2023 2315   APPEARANCEUR HAZY (A) 08/26/2023 2315   APPEARANCEUR Cloudy (A) 09/25/2015 1230   LABSPEC 1.033 (H) 08/26/2023 2315   PHURINE 5.0 08/26/2023 2315   GLUCOSEU >=500 (A) 08/26/2023 2315   HGBUR NEGATIVE 08/26/2023 2315  BILIRUBINUR NEGATIVE 08/26/2023 2315   BILIRUBINUR Negative 09/25/2015 1230   KETONESUR NEGATIVE 08/26/2023 2315   PROTEINUR 100 (A) 08/26/2023 2315   NITRITE NEGATIVE 08/26/2023 2315   LEUKOCYTESUR NEGATIVE 08/26/2023 2315    Radiological Exams on Admission: DG Chest 2 View  Result Date: 09/09/2023 CLINICAL DATA:  Provided history: Irregular heart rate. Shortness of breath. History of atrial fibrillation. EXAM: CHEST - 2 VIEW COMPARISON:  Prior chest radiographs 08/26/2023 and earlier. FINDINGS: Cardiomegaly. Aortic atherosclerosis. Central pulmonary vascular congestion without overt pulmonary edema. Progressive ill-defined opacity at the right lung base which may reflect atelectasis or pneumonia. Small right pleural effusion again noted. Small left pleural effusion, new from the prior examination of 08/26/2023. No evidence of pneumothorax. No acute osseous abnormality identified. Thoracic spondylosis. IMPRESSION: 1. Cardiomegaly. 2. Central pulmonary vascular congestion without overt pulmonary edema. 3. Ill-defined opacity within the right lung base, which may reflect atelectasis and/or pneumonia. 4. Small bilateral  pleural effusions. 5.  Aortic Atherosclerosis (ICD10-I70.0). Electronically Signed   By: Jackey Loge D.O.   On: 09/09/2023 14:31    EKG: Independently reviewed.  Sinus tachycardia rate 124, QTc 468.  No significant change from prior.  Assessment/Plan Principal Problem:   Atrial tachycardia (HCC) Active Problems:   Acute on chronic combined systolic and diastolic CHF (congestive heart failure) (HCC)   Diabetes mellitus type 2 with complications (HCC)   HTN (hypertension), benign  Assessment and Plan: * Atrial tachycardia (HCC) Heart rates 112-125.  EKG today showing sinus tachycardia rate at 124.  Recent hospitalization with atrial tachycardia, started on amiodarone infusion and discharged on amiodarone 200 mg daily, recommended dose reduction started yesterday due to patient's symptoms of dyspnea, exertion, easy fatigability and hot flashes.  - Cardiology consult in a.m. -Resume amiodarone 200 mg daily   Acute on chronic combined systolic and diastolic CHF (congestive heart failure) (HCC) Chest x-ray shows central pulmonary vascular congestion, right lower base opacity-atelectasis or pneumonia.  BNP markedly elevated at 3202.   recent echo 10/2-EF of 15 to 20% with grade 3 DD (restrictive).  Reports baseline weight 160s, today weight 177. -IV Lasix 40 twice daily -Cardiology consult in a.m. -Input output, daily weights, daily BMP   Diabetes mellitus type 2 with complications (HCC) Controlled.  A1c 6.5. - SSI- S -Hold Farxiga, Tradjenta  HTN (hypertension), benign Blood pressure systolic 150s to 782N. -As needed labetalol for systolic greater than 170   DVT prophylaxis: Lovenox Code Status: FULL Family Communication: Daughter- DeAsia at bedside Disposition Plan: ~ 2 days Consults called: Cardiology Admission status: Inpt tele  I certify that at the point of admission it is my clinical judgment that the patient will require inpatient hospital care spanning beyond 2 midnights  from the point of admission due to high intensity of service, high risk for further deterioration and high frequency of surveillance required.  Author: Onnie Boer, MD 09/09/2023 10:40 PM  For on call review [email or web address].

## 2023-09-09 NOTE — Assessment & Plan Note (Signed)
Blood pressure systolic 150s to 161W. -As needed labetalol for systolic greater than 170

## 2023-09-09 NOTE — Assessment & Plan Note (Addendum)
Controlled.  A1c 6.5. - SSI- S -Hold Farxiga, Tradjenta

## 2023-09-09 NOTE — Assessment & Plan Note (Addendum)
Heart rates 112-125.  EKG today showing sinus tachycardia rate at 124.  Recent hospitalization with atrial tachycardia, started on amiodarone infusion and discharged on amiodarone 200 mg daily, recommended dose reduction started yesterday due to patient's symptoms of dyspnea, exertion, easy fatigability and hot flashes.  - Cardiology consult in a.m. -Resume amiodarone 200 mg daily

## 2023-09-09 NOTE — ED Provider Notes (Signed)
[place] EMERGENCY DEPARTMENT AT [facility] Provider Note   CSN: [id number] Arrival date & time: 09/09/23  1129     History Chief Complaint  Patient presents with   Irregular Heart Beat    Michelle Morrison is a 62 y.o. female with history of congestive heart failure, hypertension, diabetes, atrial tachycardia presents emerged part today for evaluation of shortness of breath worsening for the past few days.  Patient reports that usually with exertion and she does not feel with sitting still.  She denies any palpitations or any chest pain.  Denies any cough, rhinorrhea, nasal congestion, fever, or any hemoptysis.  She has not felt her legs are swollen.  She reports has been compliant with her medications.  Recently had a amiodarone dose changed from 200 twice a day to just 200 once a day.  She is not taking any Lasix at home.  She reports that she has good follow-up with her cardiologist, Dr. Wyline Mood.  HPI     Home Medications Prior to Admission medications   Medication Sig Start Date End Date Taking? Authorizing Provider  albuterol (VENTOLIN HFA) 108 (90 Base) MCG/ACT inhaler Inhale 1-2 puffs into the lungs every 4 (four) hours as needed. 05/30/20   [provider]  amiodarone (PACERONE) 200 MG tablet Take 1 tablet (200 mg total) by mouth daily. 09/08/23   Antoine Poche, MD  atorvastatin (LIPITOR) 80 MG tablet Take 1 tablet (80 mg total) by mouth daily. 03/14/21   Antoine Poche, MD  dapagliflozin propanediol (FARXIGA) 10 MG TABS tablet TAKE 1 TABLET (10 MG TOTAL) BY MOUTH DAILY. 03/14/21   Laurey Morale, MD  diclofenac Sodium (VOLTAREN) 1 % GEL Apply 4 g topically daily as needed (stomach pain). 04/22/23   [provider]  isosorbide mononitrate (IMDUR) 30 MG 24 hr tablet Take 1 tablet (30 mg total) by mouth daily. 01/09/23 01/04/24  Antoine Poche, MD  sacubitril-valsartan (ENTRESTO) 49-51 MG Take 1 tablet by mouth 2 (two) times daily. 08/19/23    Antoine Poche, MD  TRADJENTA 5 MG TABS tablet Take 5 mg by mouth daily. 08/12/22   [provider]      Allergies    Patient has no known allergies.    Review of Systems   Review of Systems  Constitutional:  Negative for chills and fever.  HENT:  Negative for congestion and rhinorrhea.   Respiratory:  Positive for shortness of breath. Negative for cough and wheezing.   Cardiovascular:  Negative for chest pain, palpitations and leg swelling.  Gastrointestinal:  Negative for abdominal pain, nausea and vomiting.    Physical Exam Updated Vital Signs BP (!) 155/102   Pulse (!) 116   Temp 98 F (36.7 C) (Oral)   Resp (!) 22   Ht 5\' 7"  (1.702 m)   Wt 80.3 kg   LMP 05/23/2007   SpO2 95%   BMI 27.72 kg/m  Physical Exam Vitals and nursing note reviewed.  Constitutional:      General: She is not in acute distress.    Appearance: She is not toxic-appearing or diaphoretic.  HENT:     Mouth/Throat:     Mouth: Mucous membranes are moist.  Eyes:     General: No scleral icterus. Cardiovascular:     Rate and Rhythm: Tachycardia present.     Pulses: Normal pulses.  Pulmonary:     Effort: Pulmonary effort is normal. No respiratory distress.     Breath sounds: Normal breath  sounds.  Abdominal:     Palpations: Abdomen is soft.  Musculoskeletal:     Comments: Trace bilateral lower edema.   Skin:    General: Skin is warm and dry.  Neurological:     Mental Status: She is alert.     ED Results / Procedures / Treatments   Labs (all labs ordered are listed, but only abnormal results are displayed) Labs Reviewed  BASIC METABOLIC PANEL - Abnormal; Notable for the following components:      Result Value   Glucose, Bld 118 (*)    Creatinine, Ser 1.24 (*)    Calcium 8.6 (*)    GFR, Estimated 50 (*)    All other components within normal limits  CBC - Abnormal; Notable for the following components:   Hemoglobin 11.6 (*)    HCT 35.8 (*)    RDW 15.8 (*)    Platelets 451  (*)    All other components within normal limits  BRAIN NATRIURETIC PEPTIDE  TSH  TROPONIN I (HIGH SENSITIVITY)    EKG EKG Interpretation Date/Time:  Tuesday September 09 2023 11:42:09 EDT Ventricular Rate:  124 PR Interval:  140 QRS Duration:  128 QT Interval:  326 QTC Calculation: 468 R Axis:   108  Text Interpretation: Sinus tachycardia Left atrial enlargement ST/T changes similar to earlier in the month. Confirmed by Pricilla Loveless [phone]) on 09/09/2023 4:41:56 PM  Radiology DG Chest 2 View  Result Date: 09/09/2023 CLINICAL DATA:  Provided history: Irregular heart rate. Shortness of breath. History of atrial fibrillation. EXAM: CHEST - 2 VIEW COMPARISON:  Prior chest radiographs 08/26/2023 and earlier. FINDINGS: Cardiomegaly. Aortic atherosclerosis. Central pulmonary vascular congestion without overt pulmonary edema. Progressive ill-defined opacity at the right lung base which may reflect atelectasis or pneumonia. Small right pleural effusion again noted. Small left pleural effusion, new from the prior examination of 08/26/2023. No evidence of pneumothorax. No acute osseous abnormality identified. Thoracic spondylosis. IMPRESSION: 1. Cardiomegaly. 2. Central pulmonary vascular congestion without overt pulmonary edema. 3. Ill-defined opacity within the right lung base, which may reflect atelectasis and/or pneumonia. 4. Small bilateral pleural effusions. 5.  Aortic Atherosclerosis (ICD10-I70.0). Electronically Signed   By: Jackey Loge D.O.   On: 09/09/2023 14:31    Procedures Procedures   Medications Ordered in ED Medications  albuterol (PROVENTIL) (2.5 MG/3ML) 0.083% nebulizer solution 3 mL (has no administration in time range)  amiodarone (PACERONE) tablet 200 mg (has no administration in time range)  atorvastatin (LIPITOR) tablet 80 mg (has no administration in time range)  isosorbide mononitrate (IMDUR) 24 hr tablet 30 mg (has no administration in time range)   sacubitril-valsartan (ENTRESTO) 49-51 mg per tablet (1 tablet Oral Given 09/09/23 2302)  insulin aspart (novoLOG) injection 0-9 Units (has no administration in time range)  insulin aspart (novoLOG) injection 0-5 Units ( Subcutaneous Not Given 09/09/23 2302)  furosemide (LASIX) injection 40 mg (has no administration in time range)  enoxaparin (LOVENOX) injection 40 mg (has no administration in time range)  acetaminophen (TYLENOL) tablet 650 mg (has no administration in time range)    Or  acetaminophen (TYLENOL) suppository 650 mg (has no administration in time range)  ondansetron (ZOFRAN) tablet 4 mg (has no administration in time range)    Or  ondansetron (ZOFRAN) injection 4 mg (has no administration in time range)  polyethylene glycol (MIRALAX / GLYCOLAX) packet 17 g (has no administration in time range)  labetalol (NORMODYNE) injection 10 mg (has no administration in time range)  furosemide (LASIX) injection  20 mg (20 mg Intravenous Given 09/09/23 1858)    ED Course/ Medical Decision Making/ A&P                               Medical Decision Making Amount and/or Complexity of Data Reviewed Labs: ordered. Radiology: ordered.  Risk Prescription drug management. Decision regarding hospitalization.   62 y.o. female presents to the ER for evaluation of SOB. Differential diagnosis includes but is not limited to CHF, pericardial effusion/tamponade, arrhythmias, ACS, COPD, asthma, bronchitis, pneumonia, pneumothorax, PE, anemia. Vital signs blood pressure elevated at 182/111.  Mild kidney and 24 however 97% on room air.  Pulse rate 125, afebrile. Physical exam as noted above.  The patient does not appear to acute distress.  On previous chart evaluation, I see that the patient was admitted on 08/26/23 for SVT and was seen by Dr. Wyline Mood while here. It appears he thought this was more atach and she was started on amiodorone. Additionally, her ECHO from this time shows worsening EF at 15-20%.  She was recently turned to amiodorone 200 once a day instead of twice a day because of her adverse symptoms from the medication.  Will continue with labs, EKG, and CXR.   I independently reviewed and interpreted the patient's labs.  BNP is significant elevated at 3202.  This is increased from her previous lab work from 2 weeks ago.  CBC does show anemia with a hemoglobin of 11.6 however appears around patient's baseline.  Does have some increase in her platelets of 451 which is appears to be a new finding.  BMP shows creatinine 1.24 which is elevated from her previous around 0.88 nine days prior.  Mildly elevated glucose at 118.  Calcium 8.6.  Troponin at 25 with repeat at 31.  Slightly elevated likely due to patient's tachycardia.  Appears around her baseline.  EKG reviewed and interpreted by my attending and read as sinus tachycardia Left atrial enlargement ST/T changes similar to earlier in the month.   CXR  1. Cardiomegaly.  2. Central pulmonary vascular congestion without overt pulmonary  edema.  3. Ill-defined opacity within the right lung base, which may reflect  atelectasis and/or pneumonia.  4. Small bilateral pleural effusions.  5.  Aortic Atherosclerosis (ICD10-I70.0).   I consulted cardiology and spoke with Dr. Antoine Poche. He agreed with the EKG read. He would like the patient admitted for hypertensive urgency and cardiology will see her inpatient for better medical management. Recommends giving her some Lasix and hydralizine 10mg  q6h PRN. Both were ordered.   I discussed this with the patient and she is amenable. Considered PE, but will allow further workup to be obtained inpatient. Admit to hospitalist.   Portions of this report may have been transcribed using voice recognition software. Every effort was made to ensure accuracy; however, inadvertent computerized transcription errors may be present.   Final Clinical Impression(s) / ED Diagnoses Final diagnoses:  Hypertensive  urgency  Tachycardia    Rx / DC Orders ED Discharge Orders     None         Achille Rich, PA-C 09/10/23 0003    Pricilla Loveless, MD 09/13/23 [phone]

## 2023-09-10 DIAGNOSIS — I5043 Acute on chronic combined systolic (congestive) and diastolic (congestive) heart failure: Secondary | ICD-10-CM | POA: Diagnosis present

## 2023-09-10 DIAGNOSIS — N1831 Chronic kidney disease, stage 3a: Secondary | ICD-10-CM | POA: Diagnosis present

## 2023-09-10 DIAGNOSIS — Z9049 Acquired absence of other specified parts of digestive tract: Secondary | ICD-10-CM | POA: Diagnosis not present

## 2023-09-10 DIAGNOSIS — I428 Other cardiomyopathies: Secondary | ICD-10-CM | POA: Diagnosis present

## 2023-09-10 DIAGNOSIS — I495 Sick sinus syndrome: Secondary | ICD-10-CM | POA: Diagnosis present

## 2023-09-10 DIAGNOSIS — Z7984 Long term (current) use of oral hypoglycemic drugs: Secondary | ICD-10-CM | POA: Diagnosis not present

## 2023-09-10 DIAGNOSIS — I5023 Acute on chronic systolic (congestive) heart failure: Secondary | ICD-10-CM | POA: Diagnosis present

## 2023-09-10 DIAGNOSIS — I10 Essential (primary) hypertension: Secondary | ICD-10-CM | POA: Diagnosis not present

## 2023-09-10 DIAGNOSIS — I160 Hypertensive urgency: Secondary | ICD-10-CM | POA: Diagnosis present

## 2023-09-10 DIAGNOSIS — I4719 Other supraventricular tachycardia: Secondary | ICD-10-CM | POA: Diagnosis present

## 2023-09-10 DIAGNOSIS — Z79899 Other long term (current) drug therapy: Secondary | ICD-10-CM | POA: Diagnosis not present

## 2023-09-10 DIAGNOSIS — Z8249 Family history of ischemic heart disease and other diseases of the circulatory system: Secondary | ICD-10-CM | POA: Diagnosis not present

## 2023-09-10 DIAGNOSIS — Z5986 Financial insecurity: Secondary | ICD-10-CM | POA: Diagnosis not present

## 2023-09-10 DIAGNOSIS — B009 Herpesviral infection, unspecified: Secondary | ICD-10-CM | POA: Diagnosis present

## 2023-09-10 DIAGNOSIS — I447 Left bundle-branch block, unspecified: Secondary | ICD-10-CM | POA: Diagnosis present

## 2023-09-10 DIAGNOSIS — I130 Hypertensive heart and chronic kidney disease with heart failure and stage 1 through stage 4 chronic kidney disease, or unspecified chronic kidney disease: Secondary | ICD-10-CM | POA: Diagnosis present

## 2023-09-10 DIAGNOSIS — E785 Hyperlipidemia, unspecified: Secondary | ICD-10-CM | POA: Diagnosis present

## 2023-09-10 DIAGNOSIS — Z833 Family history of diabetes mellitus: Secondary | ICD-10-CM | POA: Diagnosis not present

## 2023-09-10 DIAGNOSIS — E118 Type 2 diabetes mellitus with unspecified complications: Secondary | ICD-10-CM | POA: Diagnosis not present

## 2023-09-10 DIAGNOSIS — E1122 Type 2 diabetes mellitus with diabetic chronic kidney disease: Secondary | ICD-10-CM | POA: Diagnosis present

## 2023-09-10 LAB — BASIC METABOLIC PANEL
Anion gap: 10 (ref 5–15)
BUN: 19 mg/dL (ref 8–23)
CO2: 25 mmol/L (ref 22–32)
Calcium: 8.1 mg/dL — ABNORMAL LOW (ref 8.9–10.3)
Chloride: 101 mmol/L (ref 98–111)
Creatinine, Ser: 1.1 mg/dL — ABNORMAL HIGH (ref 0.44–1.00)
GFR, Estimated: 57 mL/min — ABNORMAL LOW (ref >60)
Glucose, Bld: 101 mg/dL — ABNORMAL HIGH (ref 70–99)
Potassium: 3.5 mmol/L (ref 3.5–5.1)
Sodium: 136 mmol/L (ref 135–145)

## 2023-09-10 LAB — GLUCOSE, CAPILLARY
Glucose-Capillary: 93 mg/dL (ref 70–99)
Glucose-Capillary: 103 mg/dL — ABNORMAL HIGH (ref 70–99)
Glucose-Capillary: 136 mg/dL — ABNORMAL HIGH (ref 70–99)
Glucose-Capillary: 144 mg/dL — ABNORMAL HIGH (ref 70–99)

## 2023-09-10 MED ORDER — HYDRALAZINE HCL 20 MG/ML IJ SOLN: 10.0000 mg | Freq: Four times a day (QID) | INTRAMUSCULAR | Status: DC | PRN

## 2023-09-10 MED ORDER — HYDRALAZINE HCL 25 MG PO TABS
25.0000 mg | ORAL_TABLET | Freq: Three times a day (TID) | ORAL | Status: DC
  Administered 2023-09-10 – 2023-09-13 (×10): 25 mg via ORAL
  Filled 2023-09-10 (×10): qty 1

## 2023-09-10 NOTE — Plan of Care (Signed)
  Problem: Respiratory: Goal: Ability to maintain adequate ventilation will improve Outcome: Progressing   Problem: Education: Goal: Knowledge of General Education information will improve Description: Including pain rating scale, medication(s)/side effects and non-pharmacologic comfort measures Outcome: Progressing   Problem: Health Behavior/Discharge Planning: Goal: Ability to manage health-related needs will improve Outcome: Progressing   Problem: Clinical Measurements: Goal: Ability to maintain clinical measurements within normal limits will improve Outcome: Progressing Goal: Respiratory complications will improve Outcome: Progressing   Problem: Activity: Goal: Risk for activity intolerance will decrease Outcome: Progressing   Problem: Nutrition: Goal: Adequate nutrition will be maintained Outcome: Progressing

## 2023-09-10 NOTE — Progress Notes (Signed)
Report called to [facility] on 6E.

## 2023-09-10 NOTE — Hospital Course (Signed)
62 year old female with a history of HFrEF, atrial tachycardia, hypertension, diabetes mellitus type 2, CKD stage III, hyperlipidemia presenting with complaints of  feeling her heart rate "going up and down".  She denies any frank chest pain, reports some dyspnea on exertion.  She denies any fevers, chills, cough, hemoptysis, nausea, vomit, diarrhea, abdominal pain.  The patient does report some weight gain up to 177 pounds.  Notably, the patient had an office weight of 168 pounds in the cardiology office on 08/07/2023.  Recent hospitalization  10/1 to 10/6-heart rate initially in the 160s, started on amiodarone, evaluated by cardiology.  Discharged on amiodarone 200 mg twice daily.   Patient reports she was taking the medication as prescribed since discharge, but then she called her cardiologist office because of difficulty breathing, weakness, hot flashes, easy fatigability and feeling bad overall.  It was recommended that she reduce her dose of amiodarone to 200 mg daily.  She started taking the reduced dose yesterday, and today she started having symptoms of racing heart rate. ED Course: Tachycardic heart rate 112-125.  Respirate rate 20-28.  Temperature 98.  Blood pressure systolic 150s to 161W.  O2 sats greater than 93% on room air. BNP elevated at 3202.  Troponin 25 Chest x-ray with central pulmonary vascular congestion, right lower base opacity atelectasis or pneumonia.  The patient was seen by cardiology.  They recommended transfer to [facility] for electrophysiology to consult for the patient's atrial tachycardia.

## 2023-09-10 NOTE — Progress Notes (Signed)
Mobility Specialist Progress Note:    09/10/23 0930  Mobility  Activity Ambulated with assistance in hallway  Level of Assistance Standby assist, set-up cues, supervision of patient - no hands on  Assistive Device Front wheel walker  Distance Ambulated (ft) 130 ft  Range of Motion/Exercises Active;All extremities  Activity Response Tolerated well  Mobility Referral Yes  $Mobility charge 1 Mobility  Mobility Specialist Start Time (ACUTE ONLY) 0930  Mobility Specialist Stop Time (ACUTE ONLY) 0940  Mobility Specialist Time Calculation (min) (ACUTE ONLY) 10 min   Pt received sitting EOB, agreeable to mobility. Required SBA to stand and ambulate with RW. Tolerated well, asx throughout. Returned pt to room, all needs met.   Lawerance Bach Mobility Specialist Please contact via Special educational needs teacher or  Rehab office at [phone]

## 2023-09-10 NOTE — Progress Notes (Signed)
PROGRESS NOTE  Michelle Morrison:[id number] DOB: 1960/12/04 DOA: 09/09/2023 PCP: Michelle Spar, MD  Brief History:  62 year old female with a history of HFrEF, atrial tachycardia, hypertension, diabetes mellitus type 2, CKD stage III, hyperlipidemia presenting with complaints of  feeling her heart rate "going up and down".  She denies any frank chest pain, reports some dyspnea on exertion.  She denies any fevers, chills, cough, hemoptysis, nausea, vomit, diarrhea, abdominal pain.  The patient does report some weight gain up to 177 pounds.  Notably, the patient had an office weight of 168 pounds in the cardiology office on 08/07/2023.  Recent hospitalization  10/1 to 10/6-heart rate initially in the 160s, started on amiodarone, evaluated by cardiology.  Discharged on amiodarone 200 mg twice daily.   Patient reports she was taking the medication as prescribed since discharge, but then she called her cardiologist office because of difficulty breathing, weakness, hot flashes, easy fatigability and feeling bad overall.  It was recommended that she reduce her dose of amiodarone to 200 mg daily.  She started taking the reduced dose yesterday, and today she started having symptoms of racing heart rate. ED Course: Tachycardic heart rate 112-125.  Respirate rate 20-28.  Temperature 98.  Blood pressure systolic 150s to 956O.  O2 sats greater than 93% on room air. BNP elevated at 3202.  Troponin 25 Chest x-ray with central pulmonary vascular congestion, right lower base opacity atelectasis or pneumonia.  The patient was seen by cardiology.  They recommended transfer to [facility] for electrophysiology to consult for the patient's atrial tachycardia.   Assessment/Plan: Atrial tachycardia (HCC) -Heart rates 112-125.  -Recent hospitalization with atrial tachycardia, started on amiodarone infusion and discharged on amiodarone 200 mg bid, -recommended dose reduction (to once daily) started  09/08/23 due to patient's symptoms of dyspnea, exertion, easy fatigability and hot flashes.  - Cardiology consult appreciated>>transfer to [facility] for EP to evaluate - discussed with Dr. Wyline Mood   Acute on chronic HFrEF -Chest x-ray shows central pulmonary vascular congestion, right lower base opacity-atelectasis or pneumonia.  BNP markedly elevated at 3202.    -08/27/23  echo EF of 15 to 20% with grade 3 DD (restrictive).  Reports baseline weight 160s, today weight 177. -Continue IV Lasix 40 twice daily -Cardiology consult appreciated -Input output, daily weights, daily BMP -Continue Imdur and Entresto -No beta-blocker secondary to prior bradycardia     Diabetes mellitus type 2 with complications (HCC) -08/26/23--A1c 6.5. - SSI- S -Hold Farxiga, Tradjenta   HTN (hypertension), benign -continue hydralazine and imdur -continue Entresto  CKD 3a -baseline creatinine 1.0-1.3 -monitor with diuresis       Family Communication:  no Family at bedside  Consultants:  cardiology  Code Status:  FULL  DVT Prophylaxis:  [place] Lovenox   Procedures: As Listed in Progress Note Above  Antibiotics: None       Subjective: Patient denies fevers, chills, headache, chest pain, dyspnea, nausea, vomiting, diarrhea, abdominal pain, dysuria, hematuria, hematochezia, and melena.   Objective: Vitals:   09/09/23 2049 09/09/23 2331 09/10/23 0357 09/10/23 0751  BP:  131/81 137/80 (!) 154/99  Pulse:  (!) 109 (!) 105 (!) 108  Resp:  18 20   Temp:  98.2 F (36.8 C)    TempSrc:      SpO2:  96% 93% 98%  Weight: 79.7 kg  78.9 kg   Height:        Intake/Output Summary (Last 24 hours)  On: 09/09/2023 14:31   [place] EKG SITE RITE  Result Date: 08/29/2023 If Site Rite image not attached, placement could not be confirmed due to current cardiac rhythm.  CT ABDOMEN PELVIS W CONTRAST  Result Date: 08/27/2023 CLINICAL DATA:  Acute abdominal and epigastric pain. Nausea and vomiting. EXAM: CT ABDOMEN AND PELVIS WITH CONTRAST TECHNIQUE: Multidetector CT imaging of the abdomen and pelvis was performed using the standard protocol following bolus administration of intravenous contrast. RADIATION DOSE REDUCTION: This exam was performed according to the  departmental dose-optimization program which includes automated exposure control, adjustment of the mA and/or kV according to patient size and/or use of iterative reconstruction technique. CONTRAST:  80mL OMNIPAQUE IOHEXOL 300 MG/ML  SOLN COMPARISON:  02/17/2017 FINDINGS: Lower Chest: New small right pleural effusion and right lower lobe atelectasis. Hepatobiliary: No suspicious hepatic masses identified. Prior cholecystectomy. No evidence of biliary obstruction. Pancreas:  No mass or inflammatory changes. Spleen: Within normal limits in size and appearance. Adrenals/Urinary Tract: No suspicious masses identified. No evidence of ureteral calculi or hydronephrosis. Stomach/Bowel: No evidence of obstruction, inflammatory process or abnormal fluid collections. Diverticulosis is seen mainly involving the sigmoid colon, however there is no evidence of diverticulitis. Vascular/Lymphatic: No pathologically enlarged lymph nodes. No acute vascular findings. Reproductive:  No mass or other significant abnormality. Other:  None. Musculoskeletal:  No suspicious bone lesions identified. IMPRESSION: No acute findings within the abdomen or pelvis. New small right pleural effusion and right lower lobe atelectasis. Colonic diverticulosis, without radiographic evidence of diverticulitis. Electronically Signed   By: Michelle Morrison M.D.   On: 08/27/2023 16:40   ECHOCARDIOGRAM COMPLETE  Result Date: 08/27/2023    ECHOCARDIOGRAM REPORT   [name] Date of Exam: 08/27/2023 Medical Rec #:  [id number]      Height:       67.0 in Accession #:    [id number]     Weight:       174.6 lb Date of Birth:  October 23, 1961      BSA:          1.909 m Patient Age:    62 years       BP:           133/69 mmHg Patient Gender: F              HR:           74 bpm. Exam Location:  [facility] Procedure: 2D Echo, Cardiac Doppler, Color Doppler and Intracardiac            Opacification Agent Indications:    Atrial Flutter I48.92  History:         Patient has prior history of Echocardiogram examinations.                 Arrythmias:Atrial Fibrillation; Risk Factors:Hypertension,                 Diabetes and Dyslipidemia.  Sonographer:    Celesta Gentile RCS Referring Phys: [phone] [name] IMPRESSIONS  1. Left ventricular ejection fraction, by estimation, is 15-20%. The left ventricle has severely decreased function. The left ventricle demonstrates global hypokinesis. There is mild concentric left ventricular hypertrophy. Left ventricular diastolic parameters are consistent with Grade III diastolic dysfunction (restrictive).  2. Definity contrast shows loosely organized thrombotic material with sluggish flow at LV apex, no obvious formed thrombus.  3. Right ventricular systolic function is moderately reduced. The right ventricular size is normal. There is moderately elevated pulmonary artery  On: 09/09/2023 14:31   [place] EKG SITE RITE  Result Date: 08/29/2023 If Site Rite image not attached, placement could not be confirmed due to current cardiac rhythm.  CT ABDOMEN PELVIS W CONTRAST  Result Date: 08/27/2023 CLINICAL DATA:  Acute abdominal and epigastric pain. Nausea and vomiting. EXAM: CT ABDOMEN AND PELVIS WITH CONTRAST TECHNIQUE: Multidetector CT imaging of the abdomen and pelvis was performed using the standard protocol following bolus administration of intravenous contrast. RADIATION DOSE REDUCTION: This exam was performed according to the  departmental dose-optimization program which includes automated exposure control, adjustment of the mA and/or kV according to patient size and/or use of iterative reconstruction technique. CONTRAST:  80mL OMNIPAQUE IOHEXOL 300 MG/ML  SOLN COMPARISON:  02/17/2017 FINDINGS: Lower Chest: New small right pleural effusion and right lower lobe atelectasis. Hepatobiliary: No suspicious hepatic masses identified. Prior cholecystectomy. No evidence of biliary obstruction. Pancreas:  No mass or inflammatory changes. Spleen: Within normal limits in size and appearance. Adrenals/Urinary Tract: No suspicious masses identified. No evidence of ureteral calculi or hydronephrosis. Stomach/Bowel: No evidence of obstruction, inflammatory process or abnormal fluid collections. Diverticulosis is seen mainly involving the sigmoid colon, however there is no evidence of diverticulitis. Vascular/Lymphatic: No pathologically enlarged lymph nodes. No acute vascular findings. Reproductive:  No mass or other significant abnormality. Other:  None. Musculoskeletal:  No suspicious bone lesions identified. IMPRESSION: No acute findings within the abdomen or pelvis. New small right pleural effusion and right lower lobe atelectasis. Colonic diverticulosis, without radiographic evidence of diverticulitis. Electronically Signed   By: Michelle Morrison M.D.   On: 08/27/2023 16:40   ECHOCARDIOGRAM COMPLETE  Result Date: 08/27/2023    ECHOCARDIOGRAM REPORT   [name] Date of Exam: 08/27/2023 Medical Rec #:  [id number]      Height:       67.0 in Accession #:    [id number]     Weight:       174.6 lb Date of Birth:  October 23, 1961      BSA:          1.909 m Patient Age:    62 years       BP:           133/69 mmHg Patient Gender: F              HR:           74 bpm. [facility] Location[facility] Procedure: 2D Echo, Cardiac Doppler, Color Doppler and Intracardiac            Opacification Agent Indications:    Atrial Flutter I48.92  History:         Patient has prior history of Echocardiogram examinations.                 Arrythmias:Atrial Fibrillation; Risk Factors:Hypertension,                 Diabetes and Dyslipidemia.  Sonographer:    Celesta Gentile RCS Referring Phys: [phone] [name] IMPRESSIONS  1. Left ventricular ejection fraction, by estimation, is 15-20%. The left ventricle has severely decreased function. The left ventricle demonstrates global hypokinesis. There is mild concentric left ventricular hypertrophy. Left ventricular diastolic parameters are consistent with Grade III diastolic dysfunction (restrictive).  2. Definity contrast shows loosely organized thrombotic material with sluggish flow at LV apex, no obvious formed thrombus.  3. Right ventricular systolic function is moderately reduced. The right ventricular size is normal. There is moderately elevated pulmonary artery  On: 09/09/2023 14:31   [place] EKG SITE RITE  Result Date: 08/29/2023 If Site Rite image not attached, placement could not be confirmed due to current cardiac rhythm.  CT ABDOMEN PELVIS W CONTRAST  Result Date: 08/27/2023 CLINICAL DATA:  Acute abdominal and epigastric pain. Nausea and vomiting. EXAM: CT ABDOMEN AND PELVIS WITH CONTRAST TECHNIQUE: Multidetector CT imaging of the abdomen and pelvis was performed using the standard protocol following bolus administration of intravenous contrast. RADIATION DOSE REDUCTION: This exam was performed according to the  departmental dose-optimization program which includes automated exposure control, adjustment of the mA and/or kV according to patient size and/or use of iterative reconstruction technique. CONTRAST:  80mL OMNIPAQUE IOHEXOL 300 MG/ML  SOLN COMPARISON:  02/17/2017 FINDINGS: Lower Chest: New small right pleural effusion and right lower lobe atelectasis. Hepatobiliary: No suspicious hepatic masses identified. Prior cholecystectomy. No evidence of biliary obstruction. Pancreas:  No mass or inflammatory changes. Spleen: Within normal limits in size and appearance. Adrenals/Urinary Tract: No suspicious masses identified. No evidence of ureteral calculi or hydronephrosis. Stomach/Bowel: No evidence of obstruction, inflammatory process or abnormal fluid collections. Diverticulosis is seen mainly involving the sigmoid colon, however there is no evidence of diverticulitis. Vascular/Lymphatic: No pathologically enlarged lymph nodes. No acute vascular findings. Reproductive:  No mass or other significant abnormality. Other:  None. Musculoskeletal:  No suspicious bone lesions identified. IMPRESSION: No acute findings within the abdomen or pelvis. New small right pleural effusion and right lower lobe atelectasis. Colonic diverticulosis, without radiographic evidence of diverticulitis. Electronically Signed   By: Michelle Morrison M.D.   On: 08/27/2023 16:40   ECHOCARDIOGRAM COMPLETE  Result Date: 08/27/2023    ECHOCARDIOGRAM REPORT   [name] Date of Exam: 08/27/2023 Medical Rec #:  [id number]      Height:       67.0 in Accession #:    [id number]     Weight:       174.6 lb Date of Birth:  October 23, 1961      BSA:          1.909 m Patient Age:    62 years       BP:           133/69 mmHg Patient Gender: F              HR:           74 bpm. [facility] Procedure: 2D Echo, Cardiac Doppler, Color Doppler and Intracardiac            Opacification Agent Indications:    Atrial Flutter I48.92  History:         Patient has prior history of Echocardiogram examinations.                 Arrythmias:Atrial Fibrillation; Risk Factors:Hypertension,                 Diabetes and Dyslipidemia.  Sonographer:    Celesta Gentile RCS Referring Phys: [phone] [name] IMPRESSIONS  1. Left ventricular ejection fraction, by estimation, is 15-20%. The left ventricle has severely decreased function. The left ventricle demonstrates global hypokinesis. There is mild concentric left ventricular hypertrophy. Left ventricular diastolic parameters are consistent with Grade III diastolic dysfunction (restrictive).  2. Definity contrast shows loosely organized thrombotic material with sluggish flow at LV apex, no obvious formed thrombus.  3. Right ventricular systolic function is moderately reduced. The right ventricular size is normal. There is moderately elevated pulmonary artery  On: 09/09/2023 14:31   [place] EKG SITE RITE  Result Date: 08/29/2023 If Site Rite image not attached, placement could not be confirmed due to current cardiac rhythm.  CT ABDOMEN PELVIS W CONTRAST  Result Date: 08/27/2023 CLINICAL DATA:  Acute abdominal and epigastric pain. Nausea and vomiting. EXAM: CT ABDOMEN AND PELVIS WITH CONTRAST TECHNIQUE: Multidetector CT imaging of the abdomen and pelvis was performed using the standard protocol following bolus administration of intravenous contrast. RADIATION DOSE REDUCTION: This exam was performed according to the  departmental dose-optimization program which includes automated exposure control, adjustment of the mA and/or kV according to patient size and/or use of iterative reconstruction technique. CONTRAST:  80mL OMNIPAQUE IOHEXOL 300 MG/ML  SOLN COMPARISON:  02/17/2017 FINDINGS: Lower Chest: New small right pleural effusion and right lower lobe atelectasis. Hepatobiliary: No suspicious hepatic masses identified. Prior cholecystectomy. No evidence of biliary obstruction. Pancreas:  No mass or inflammatory changes. Spleen: Within normal limits in size and appearance. Adrenals/Urinary Tract: No suspicious masses identified. No evidence of ureteral calculi or hydronephrosis. Stomach/Bowel: No evidence of obstruction, inflammatory process or abnormal fluid collections. Diverticulosis is seen mainly involving the sigmoid colon, however there is no evidence of diverticulitis. Vascular/Lymphatic: No pathologically enlarged lymph nodes. No acute vascular findings. Reproductive:  No mass or other significant abnormality. Other:  None. Musculoskeletal:  No suspicious bone lesions identified. IMPRESSION: No acute findings within the abdomen or pelvis. New small right pleural effusion and right lower lobe atelectasis. Colonic diverticulosis, without radiographic evidence of diverticulitis. Electronically Signed   By: Michelle Morrison M.D.   On: 08/27/2023 16:40   ECHOCARDIOGRAM COMPLETE  Result Date: 08/27/2023    ECHOCARDIOGRAM REPORT   [name] Date of Exam: 08/27/2023 Medical Rec #:  [id number]      Height:       67.0 in Accession #:    [id number]     Weight:       174.6 lb Date of Birth:  October 23, 1961      BSA:          1.909 m Patient Age:    62 years       BP:           133/69 mmHg Patient Gender: F              HR:           74 bpm. [facility]am Location[facility] Procedure: 2D Echo, Cardiac Doppler, Color Doppler and Intracardiac            Opacification Agent Indications:    Atrial Flutter I48.92  History:         Patient has prior history of Echocardiogram examinations.                 Arrythmias:Atrial Fibrillation; Risk Factors:Hypertension,                 Diabetes and Dyslipidemia.  Sonographer:    Celesta Gentile RCS Referring Phys: [phone] [name] IMPRESSIONS  1. Left ventricular ejection fraction, by estimation, is 15-20%. The left ventricle has severely decreased function. The left ventricle demonstrates global hypokinesis. There is mild concentric left ventricular hypertrophy. Left ventricular diastolic parameters are consistent with Grade III diastolic dysfunction (restrictive).  2. Definity contrast shows loosely organized thrombotic material with sluggish flow at LV apex, no obvious formed thrombus.  3. Right ventricular systolic function is moderately reduced. The right ventricular size is normal. There is moderately elevated pulmonary artery

## 2023-09-10 NOTE — Consult Note (Signed)
Cardiology Consultation   Patient ID: Michelle Morrison MRN: [id number]; DOB: 28-Sep-1961  Admit date: 09/09/2023 Date of Consult: 09/10/2023  PCP:  [name], MD   [facility] Providers Cardiologist:  Dina Rich, MD  Electrophysiologist:  Regan Lemming, MD  {      Patient Profile:   Michelle Morrison is a 62 y.o. female with a hx of PSVT/atach, chronic HFrEF  who is being seen 09/10/2023 for the evaluation of palpitatoins and OB at the request of Dr Tat.  History of Present Illness:   Michelle Morrison 62 yo female history of PSVT/atach, chronic HFrEF, HLD, HTN, DM2, admitted with palpitations and weight gain, by home report 163 lbs increased to 177 lbs.Recent admission with recurrent atach and volume overload earlier this month, she was on dronderone at the time. Diuresed and changed to amiodarone. Felt well first few days after discharge, but then gradual progression of palpitations and SOB/DOE>    K 4.2 Cr 1.24 BUN 22 WBC 6.3 Hgb 11.6 Plt 451 BNP 3202 TSH 3.4  Trop 25-->31 CXR vascular congestion EKG atach 120s  08/2023 echo: LVE 15-20%, grade III dd, mod LAE Past Medical History:  Diagnosis Date   Atrial tachycardia (HCC)    CHF (congestive heart failure) (HCC)    a. EF 45% by echo in 03/2020 with normal cors by cath   Diabetes mellitus without complication (HCC)    Diarrhea    Heart disease    Herpes simplex infection    Hyperlipidemia    Hypertension     Past Surgical History:  Procedure Laterality Date   CESAREAN SECTION     CHOLECYSTECTOMY     COLONOSCOPY N/A 08/26/2014   Procedure: COLONOSCOPY;  Surgeon: West Bali, MD;  Location: [facility];  Service: Endoscopy;  Laterality: N/A;  9:30 AM - moved to 8:30 - Ginger to notify pt   LEFT HEART CATH AND CORONARY ANGIOGRAPHY N/A 04/14/2020   Procedure: LEFT HEART CATH AND CORONARY ANGIOGRAPHY;  Surgeon: [place], [name], MD;  Location: [facility] INVASIVE CV LAB;  Service: Cardiovascular;   Laterality: N/A;   RIGHT/LEFT HEART CATH AND CORONARY ANGIOGRAPHY N/A 02/19/2021   Procedure: RIGHT/LEFT HEART CATH AND CORONARY ANGIOGRAPHY;  Surgeon: Laurey Morale, MD;  Location: [facility] INVASIVE CV LAB;  Service: Cardiovascular;  Laterality: N/A;      Inpatient Medications: Scheduled Meds:  amiodarone  200 mg Oral Daily   atorvastatin  80 mg Oral Daily   enoxaparin (LOVENOX) injection  40 mg Subcutaneous Q24H   furosemide  40 mg Intravenous Q12H   insulin aspart  0-5 Units Subcutaneous QHS   insulin aspart  0-9 Units Subcutaneous TID WC   isosorbide mononitrate  30 mg Oral Daily   sacubitril-valsartan  1 tablet Oral BID   Continuous Infusions:  PRN Meds: acetaminophen **OR** acetaminophen, albuterol, labetalol, ondansetron **OR** ondansetron (ZOFRAN) IV, polyethylene glycol  Allergies:   No Known Allergies  Social History:   Social History   Socioeconomic History   Marital status: Single    Spouse name: Not on file   Number of children: Not on file   Years of education: Not on file   Highest education level: Not on file  Occupational History   Not on file  Tobacco Use   Smoking status: Never   Smokeless tobacco: Never  Vaping Use   Vaping status: Never Used  Substance and Sexual Activity   Alcohol use: No    Alcohol/week: 0.0 standard drinks  of alcohol   Drug use: No   Sexual activity: Not Currently    Birth control/protection: Post-menopausal, Abstinence  Other Topics Concern   Not on file  Social History Narrative   Not on file   Social Determinants of Health   Financial Resource Strain: Low Risk  (02/27/2021)   Overall Financial Resource Strain (CARDIA)    Difficulty of Paying Living Expenses: Not hard at all  Recent Concern: Financial Resource Strain - Medium Risk (02/19/2021)   Overall Financial Resource Strain (CARDIA)    Difficulty of Paying Living Expenses: Somewhat hard  Food Insecurity: No Food Insecurity (09/09/2023)   Hunger Vital Sign    Worried  About Running Out of Food in the Last Year: Never true    Ran Out of Food in the Last Year: Never true  Transportation Needs: No Transportation Needs (09/09/2023)   PRAPARE - Administrator, Civil Service (Medical): No    Lack of Transportation (Non-Medical): No  Physical Activity: Sufficiently Active (02/27/2021)   Exercise Vital Sign    Days of Exercise per Week: 7 days    Minutes of Exercise per Session: 30 min  Stress: No Stress Concern Present (02/27/2021)   [employer] of Occupational Health - Occupational Stress Questionnaire    Feeling of Stress : Not at all  Social Connections: Unknown (02/27/2021)   Social Connection and Isolation Panel [NHANES]    Frequency of Communication with Friends and Family: More than three times a week    Frequency of Social Gatherings with Friends and Family: Once a week    Attends Religious Services: 1 to 4 times per year    Active Member of [employer] or Organizations: No    Attends Banker Meetings: Never    Marital Status: Patient declined  Catering manager Violence: Not At Risk (09/09/2023)   Humiliation, Afraid, Rape, and Kick questionnaire    Fear of Current or Ex-Partner: No    Emotionally Abused: No    Physically Abused: No    Sexually Abused: No    Family History:    Family History  Problem Relation Age of Onset   Hypertension Mother    Diabetes Mother    Colon cancer Neg Hx      ROS:  Please see the history of present illness.   All other ROS reviewed and negative.     Physical Exam/Data:   Vitals:   09/09/23 2049 09/09/23 2331 09/10/23 0357 09/10/23 0751  BP:  131/81 137/80 (!) 154/99  Pulse:  (!) 109 (!) 105 (!) 108  Resp:  18 20   Temp:  98.2 F (36.8 C)    TempSrc:      SpO2:  96% 93% 98%  Weight: 79.7 kg  78.9 kg   Height:        Intake/Output Summary (Last 24 hours) at 09/10/2023 0919 Last data filed at 09/10/2023 0300 Gross per 24 hour  Intake 240 ml  Output --  Net 240 ml       09/10/2023    3:57 AM 09/09/2023    8:49 PM 09/09/2023   11:37 AM  Last 3 Weights  Weight (lbs) 173 lb 15.1 oz 175 lb 11.3 oz 177 lb  Weight (kg) 78.9 kg 79.7 kg 80.287 kg     Body mass index is 27.24 kg/m.  General:  Well nourished, well developed, in no acute distress HEENT: normal Neck: no JVD Vascular: No carotid bruits; Distal pulses 2+ bilaterally Cardiac:  regular, tachy  110, no m/r/g.  Lungs:  decreased breath sounds bilateral bases Abd: soft, nontender, no hepatomegaly  Ext: no edema Musculoskeletal:  1+ bilateral LE edema Skin: warm and dry  Neuro:  CNs 2-12 intact, no focal abnormalities noted Psych:  Normal affect       Laboratory Data:  High Sensitivity Troponin:   Recent Labs  Lab 08/26/23 1337 08/26/23 2046 09/09/23 1750 09/09/23 1859  TROPONINIHS 38* 29* 25* 31*     Chemistry Recent Labs  Lab 09/09/23 1214 09/10/23 0412  NA 136 136  K 4.2 3.5  CL 101 101  CO2 23 25  GLUCOSE 118* 101*  BUN 22 19  CREATININE 1.24* 1.10*  CALCIUM 8.6* 8.1*  GFRNONAA 50* 57*  ANIONGAP 12 10    No results for input(s): "PROT", "ALBUMIN", "AST", "ALT", "ALKPHOS", "BILITOT" in the last 168 hours. Lipids No results for input(s): "CHOL", "TRIG", "HDL", "LABVLDL", "LDLCALC", "CHOLHDL" in the last 168 hours.  Hematology Recent Labs  Lab 09/09/23 1214  WBC 6.3  RBC 3.91  HGB 11.6*  HCT 35.8*  MCV 91.6  MCH 29.7  MCHC 32.4  RDW 15.8*  PLT 451*   Thyroid  Recent Labs  Lab 09/09/23 1750  TSH 3.479    BNP Recent Labs  Lab 09/09/23 1750  BNP 3,202.0*    DDimer No results for input(s): "DDIMER" in the last 168 hours.   Radiology/Studies:  DG Chest 2 View  Result Date: 09/09/2023 CLINICAL DATA:  Provided history: Irregular heart rate. Shortness of breath. History of atrial fibrillation. EXAM: CHEST - 2 VIEW COMPARISON:  Prior chest radiographs 08/26/2023 and earlier. FINDINGS: Cardiomegaly. Aortic atherosclerosis. Central pulmonary vascular  congestion without overt pulmonary edema. Progressive ill-defined opacity at the right lung base which may reflect atelectasis or pneumonia. Small right pleural effusion again noted. Small left pleural effusion, new from the prior examination of 08/26/2023. No evidence of pneumothorax. No acute osseous abnormality identified. Thoracic spondylosis. IMPRESSION: 1. Cardiomegaly. 2. Central pulmonary vascular congestion without overt pulmonary edema. 3. Ill-defined opacity within the right lung base, which may reflect atelectasis and/or pneumonia. 4. Small bilateral pleural effusions. 5.  Aortic Atherosclerosis (ICD10-I70.0). Electronically Signed   By: Jackey Loge D.O.   On: 09/09/2023 14:31     Assessment and Plan:   1.PSVT/atrial tachcyardia - followed by EP. Previousy on dronederone but failed therapy, changed to amiodarone last admission - questionable side effects on amio 200mg  bid at home, lowered to 200mg  daily just a few days ago.  -historically beta blockers avoided due to sinus bradycardia.   -her atach can easily be confused as sinus tachycardia. Review of telemetry shows clear transitions between sinus rhythm in the 60s to 70s, with sudden change in p wave morphology and elevation of rates to >100.  -recurrent admissions with atach with leading to recurrent HF exacerbations. 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 For this reason would plan for inpatient EP evaluation.      2. Acute on chronic HFrEF -01/2021 echo: LVEF 25-30% - 01/2021 RHC/LHC: no significant CAD - 03/2023 echo LVEF 45%, grade III dd, normal RV - 08/2023 echo: LVEF 15-20%, grade III dd, mod RV dysfunction     - her prior LV dysfunction was thought to be tachy mediated and resolved with control of her atach. Suspect recurrent LV dysfunction is likely same etiology given issues with atach this admission - no  plans  for repeat ischemic testing, continue management of arrhythmias   - medical therapy with imdur 30, entresto 49/51mg  bid. Marcelline Deist held as inpatient on admission.  - Prior issues with hyperkalemia on aldactone, has been avoided - no beta blocker due to prior bradycardia - was on hydralazine, was discontinued last admission I cannot determine indication. High bp's here, restart at 25mg  tid.   - received IV lasix 20mg  x 1 yesterday, due for 40mg  bid today. I/Os incomplete, wt 177-->173 lbs. Downtrend in Cr with diuresis consistent with venous congestion and HF. Remains fluid overloaded, continue IV diuresis.        Will ask hopsital team to transfer patient to [facility] medicine team, we will ask EP to see.     For questions or updates, please contact [facility] Please consult [email or web address] for contact info under    Signed, Dina Rich, MD  09/10/2023 9:19 AM

## 2023-09-10 NOTE — Progress Notes (Signed)
   09/10/23 1622  Assess: MEWS Score  Temp 98.2 F (36.8 C)  BP (!) 159/97  MAP (mmHg) 117  Pulse Rate (!) 120  Resp 18  Level of Consciousness Alert  SpO2 95 %  O2 Device Room Air  Assess: MEWS Score  MEWS Temp 0  MEWS Systolic 0  MEWS Pulse 2  MEWS RR 0  MEWS LOC 0  MEWS Score 2  MEWS Score Color Yellow  Assess: if the MEWS score is Yellow or Red  Were vital signs accurate and taken at a resting state? Yes  Does the patient meet 2 or more of the SIRS criteria? No  MEWS guidelines implemented  Yes, yellow  Treat  MEWS Interventions Considered administering scheduled or prn medications/treatments as ordered  Take Vital Signs  Increase Vital Sign Frequency  Yellow: Q2hr x1, continue Q4hrs until patient remains green for 12hrs  Escalate  MEWS: Escalate Yellow: Discuss with charge nurse and consider notifying provider and/or RRT  Notify: Charge Nurse/RN  Name of Charge Nurse/RN Notified [facility]  Provider Notification  Provider Name/Title MD Tat  Date Provider Notified 09/10/23  Time Provider Notified [phone]  Provider response  (awaiting response)  Assess: SIRS CRITERIA  SIRS Temperature  0  SIRS Pulse 1  SIRS Respirations  0  SIRS WBC 0  SIRS Score Sum  1

## 2023-09-11 DIAGNOSIS — I5043 Acute on chronic combined systolic (congestive) and diastolic (congestive) heart failure: Secondary | ICD-10-CM | POA: Diagnosis not present

## 2023-09-11 DIAGNOSIS — I4719 Other supraventricular tachycardia: Secondary | ICD-10-CM | POA: Diagnosis not present

## 2023-09-11 LAB — BASIC METABOLIC PANEL
Anion gap: 15 (ref 5–15)
BUN: 16 mg/dL (ref 8–23)
CO2: 27 mmol/L (ref 22–32)
Calcium: 9 mg/dL (ref 8.9–10.3)
Chloride: 98 mmol/L (ref 98–111)
Creatinine, Ser: 1.08 mg/dL — ABNORMAL HIGH (ref 0.44–1.00)
GFR, Estimated: 58 mL/min — ABNORMAL LOW (ref >60)
Glucose, Bld: 103 mg/dL — ABNORMAL HIGH (ref 70–99)
Potassium: 3.4 mmol/L — ABNORMAL LOW (ref 3.5–5.1)
Sodium: 140 mmol/L (ref 135–145)

## 2023-09-11 LAB — GLUCOSE, CAPILLARY
Glucose-Capillary: 109 mg/dL — ABNORMAL HIGH (ref 70–99)
Glucose-Capillary: 123 mg/dL — ABNORMAL HIGH (ref 70–99)
Glucose-Capillary: 117 mg/dL — ABNORMAL HIGH (ref 70–99)
Glucose-Capillary: 109 mg/dL — ABNORMAL HIGH (ref 70–99)

## 2023-09-11 LAB — MAGNESIUM: Magnesium: 1.7 mg/dL (ref 1.7–2.4)

## 2023-09-11 MED ORDER — SACUBITRIL-VALSARTAN 97-103 MG PO TABS
1.0000 | ORAL_TABLET | Freq: Two times a day (BID) | ORAL | Status: DC
  Administered 2023-09-11 – 2023-09-13 (×5): 1 via ORAL
  Filled 2023-09-11 (×5): qty 1

## 2023-09-11 MED ORDER — POTASSIUM CHLORIDE CRYS ER 20 MEQ PO TBCR
60.0000 meq | EXTENDED_RELEASE_TABLET | Freq: Once | ORAL | Status: AC
  Administered 2023-09-11: 60 meq via ORAL
  Filled 2023-09-11: qty 3

## 2023-09-11 MED ORDER — MAGNESIUM SULFATE 2 GM/50ML IV SOLN
2.0000 g | Freq: Once | INTRAVENOUS | Status: AC
  Administered 2023-09-11: 2 g via INTRAVENOUS
  Filled 2023-09-11: qty 50

## 2023-09-11 NOTE — Progress Notes (Signed)
PROGRESS NOTE    Michelle Morrison  ZOX:[id number] DOB: 1961-01-22 DOA: 09/09/2023 PCP: [name], MD  Chief Complaint  Patient presents with   Irregular Heart Beat    Brief Narrative:   62 year old female with Michelle Morrison history of HFrEF, atrial tachycardia, hypertension, diabetes mellitus type 2, CKD stage III, hyperlipidemia presenting with complaints of  feeling her heart rate "going up and down".  She denies any frank chest pain, reports some dyspnea on exertion.  She denies any fevers, chills, cough, hemoptysis, nausea, vomit, diarrhea, abdominal pain.  The patient does report some weight gain up to 177 pounds.  Notably, the patient had an office weight of 168 pounds in the cardiology office on 08/07/2023.   Recent hospitalization  10/1 to 10/6-heart rate initially in the 160s, started on amiodarone, evaluated by cardiology.  Discharged on amiodarone 200 mg twice daily.   Patient reports she was taking the medication as prescribed since discharge, but then she called her cardiologist office because of difficulty breathing, weakness, hot flashes, easy fatigability and feeling bad overall.  It was recommended that she reduce her dose of amiodarone to 200 mg daily.  She started taking the reduced dose yesterday, and today she started having symptoms of racing heart rate. ED Course: Tachycardic heart rate 112-125.  Respirate rate 20-28.  Temperature 98.  Blood pressure systolic 150s to 811B.  O2 sats greater than 93% on room air. BNP elevated at 3202.  Troponin 25 Chest x-ray with central pulmonary vascular congestion, right lower base opacity atelectasis or pneumonia.   The patient was seen by cardiology.  They recommended transfer to [facility] for electrophysiology to consult for the patient's atrial tachycardia.  Assessment & Plan:   Principal Problem:   Atrial tachycardia (HCC) Active Problems:   Acute on chronic combined systolic and diastolic CHF (congestive heart failure)  (HCC)   Diabetes mellitus type 2 with complications (HCC)   HTN (hypertension), benign   Chronic kidney disease, stage 3a (HCC)   Acute on chronic HFrEF (heart failure with reduced ejection fraction) (HCC)  Atrial tachycardia (HCC) - appreciate cards recs -> appears she's been scheduled for ablation on 11/5 - amiodarone    Acute on chronic HFrEF -  CXR with central pulm vascular congestion, ill defined opacity within R lung base, bilateral effusions  - 08/27/23  echo EF of 15 to 20% with grade 3 DD (restrictive) - diuresis per cardiology - Continue Imdur and Entresto  - No beta-blocker secondary to prior bradycardia, no aldactone due to hyperkalemia   Diabetes mellitus type 2 with complications (HCC) -08/26/23--A1c 6.5. -Hold Farxiga, Tradjenta -SSI    HTN (hypertension), benign -continue hydralazine and imdur -continue Entresto   CKD 3a -baseline creatinine 1.0-1.3 -monitor with diuresis    DVT prophylaxis: lovenox Code Status: full Family Communication: daughter Disposition:   Status is: Inpatient Remains inpatient appropriate because: pending cards recs   Consultants:  cardiology  Procedures:  none  Antimicrobials:  Anti-infectives (From admission, onward)    None       Subjective: No complaints  Objective: Vitals:   09/11/23 0017 09/11/23 0412 09/11/23 0739 09/11/23 1156  BP: (!) 152/100 (!) 142/98    Pulse:  (!) 108    Resp:  18 18 18   Temp:  98.4 F (36.9 C) 97.8 F (36.6 C) 97.9 F (36.6 C)  TempSrc:  Oral Oral Oral  SpO2:  98%    Weight:  79 kg    Height:  Intake/Output Summary (Last 24 hours) at 09/11/2023 1525 Last data filed at 09/11/2023 0400 Gross per 24 hour  Intake --  Output 2300 ml  Net -2300 ml   Filed Weights   09/10/23 0357 09/10/23 2202 09/11/23 0412  Weight: 78.9 kg 79 kg 79 kg    Examination:  General exam: Appears calm and comfortable  Respiratory system: unlabored Cardiovascular system: RRR Central  nervous system: Alert and oriented. No focal neurological deficits. Extremities: no LEE    Data Reviewed: I have personally reviewed following labs and imaging studies  CBC: Recent Labs  Lab 09/09/23 1214  WBC 6.3  HGB 11.6*  HCT 35.8*  MCV 91.6  PLT 451*    Basic Metabolic Panel: Recent Labs  Lab 09/09/23 1214 09/10/23 0412 09/11/23 0402  NA 136 136 140  K 4.2 3.5 3.4*  CL 101 101 98  CO2 23 25 27   GLUCOSE 118* 101* 103*  BUN 22 19 16   CREATININE 1.24* 1.10* 1.08*  CALCIUM 8.6* 8.1* 9.0  MG  --   --  1.7    GFR: Estimated Creatinine Clearance: 58 mL/min (Hinata Diener) (by C-G formula based on SCr of 1.08 mg/dL (H)).  Liver Function Tests: No results for input(s): "AST", "ALT", "ALKPHOS", "BILITOT", "PROT", "ALBUMIN" in the last 168 hours.  CBG: Recent Labs  Lab 09/10/23 1132 09/10/23 1649 09/10/23 2102 09/11/23 0738 09/11/23 1153  GLUCAP 103* 136* 144* 109* 123*     No results found for this or any previous visit (from the past 240 hour(s)).       Radiology Studies: No results found.      Scheduled Meds:  amiodarone  200 mg Oral Daily   atorvastatin  80 mg Oral Daily   enoxaparin (LOVENOX) injection  40 mg Subcutaneous Q24H   furosemide  40 mg Intravenous Q12H   hydrALAZINE  25 mg Oral TID   insulin aspart  0-5 Units Subcutaneous QHS   insulin aspart  0-9 Units Subcutaneous TID WC   isosorbide mononitrate  30 mg Oral Daily   sacubitril-valsartan  1 tablet Oral BID   Continuous Infusions:   LOS: 1 day    Time spent: over 30 min    Lacretia Nicks, MD [facility]   To contact the attending provider between 7A-7P or the covering provider during after hours 7P-7A, please log into the web site [email or web address] and access using universal [place] password for that web site. If you do not have the password, please call the hospital operator.  09/11/2023, 3:25 PM

## 2023-09-11 NOTE — Plan of Care (Signed)
  Problem: Fluid Volume: Goal: Hemodynamic stability will improve Outcome: Progressing   Problem: Clinical Measurements: Goal: Diagnostic test results will improve Outcome: Progressing Goal: Signs and symptoms of infection will decrease Outcome: Progressing   Problem: Respiratory: Goal: Ability to maintain adequate ventilation will improve Outcome: Progressing   Problem: Education: Goal: Knowledge of General Education information will improve Description: Including pain rating scale, medication(s)/side effects and non-pharmacologic comfort measures Outcome: Progressing   Problem: Health Behavior/Discharge Planning: Goal: Ability to manage health-related needs will improve Outcome: Progressing   Problem: Clinical Measurements: Goal: Ability to maintain clinical measurements within normal limits will improve Outcome: Progressing Goal: Will remain free from infection Outcome: Progressing Goal: Diagnostic test results will improve Outcome: Progressing Goal: Respiratory complications will improve Outcome: Progressing Goal: Cardiovascular complication will be avoided Outcome: Progressing   Problem: Activity: Goal: Risk for activity intolerance will decrease Outcome: Progressing   Problem: Nutrition: Goal: Adequate nutrition will be maintained Outcome: Progressing   Problem: Coping: Goal: Level of anxiety will decrease Outcome: Progressing   Problem: Elimination: Goal: Will not experience complications related to bowel motility Outcome: Progressing Goal: Will not experience complications related to urinary retention Outcome: Progressing   Problem: Pain Managment: Goal: General experience of comfort will improve Outcome: Progressing   Problem: Safety: Goal: Ability to remain free from injury will improve Outcome: Progressing   Problem: Skin Integrity: Goal: Risk for impaired skin integrity will decrease Outcome: Progressing   Problem: Education: Goal: Ability  to demonstrate management of disease process will improve Outcome: Progressing Goal: Ability to verbalize understanding of medication therapies will improve Outcome: Progressing Goal: Individualized Educational Video(s) Outcome: Progressing   Problem: Activity: Goal: Capacity to carry out activities will improve Outcome: Progressing   Problem: Cardiac: Goal: Ability to achieve and maintain adequate cardiopulmonary perfusion will improve Outcome: Progressing   Problem: Education: Goal: Ability to describe self-care measures that may prevent or decrease complications (Diabetes Survival Skills Education) will improve Outcome: Progressing Goal: Individualized Educational Video(s) Outcome: Progressing   Problem: Coping: Goal: Ability to adjust to condition or change in health will improve Outcome: Progressing   Problem: Fluid Volume: Goal: Ability to maintain a balanced intake and output will improve Outcome: Progressing   Problem: Health Behavior/Discharge Planning: Goal: Ability to identify and utilize available resources and services will improve Outcome: Progressing Goal: Ability to manage health-related needs will improve Outcome: Progressing   Problem: Metabolic: Goal: Ability to maintain appropriate glucose levels will improve Outcome: Progressing   Problem: Nutritional: Goal: Maintenance of adequate nutrition will improve Outcome: Progressing Goal: Progress toward achieving an optimal weight will improve Outcome: Progressing   Problem: Skin Integrity: Goal: Risk for impaired skin integrity will decrease Outcome: Progressing   Problem: Tissue Perfusion: Goal: Adequacy of tissue perfusion will improve Outcome: Progressing

## 2023-09-11 NOTE — Progress Notes (Signed)
Heart Failure Navigator Progress Note  Assessed for Heart & Vascular TOC clinic readiness.  Patient does not meet criteria due to Advanced Heart Failure Team patient of Dr. Gala Romney.   Navigator will sign off at this time.   Rhae Hammock, BSN, Scientist, clinical (histocompatibility and immunogenetics) Only

## 2023-09-11 NOTE — Progress Notes (Addendum)
Rounding Note    Patient Name: Michelle Morrison Date of Encounter: 09/11/2023  [facility] Cardiologist: Dina Rich, MD   Subjective   No cardiac complaints today  Inpatient Medications    Scheduled Meds:  amiodarone  200 mg Oral Daily   atorvastatin  80 mg Oral Daily   enoxaparin (LOVENOX) injection  40 mg Subcutaneous Q24H   furosemide  40 mg Intravenous Q12H   hydrALAZINE  25 mg Oral TID   insulin aspart  0-5 Units Subcutaneous QHS   insulin aspart  0-9 Units Subcutaneous TID WC   isosorbide mononitrate  30 mg Oral Daily   sacubitril-valsartan  1 tablet Oral BID   Continuous Infusions:  PRN Meds: acetaminophen **OR** acetaminophen, albuterol, hydrALAZINE, ondansetron **OR** ondansetron (ZOFRAN) IV, polyethylene glycol   Vital Signs    Vitals:   09/10/23 2059 09/10/23 2202 09/11/23 0017 09/11/23 0412  BP: (!) 166/109 (!) 174/107 (!) 152/100 (!) 142/98  Pulse: (!) 118 (!) 121  (!) 108  Resp: 20 18  18   Temp: 98.6 F (37 C) 97.8 F (36.6 C)  98.4 F (36.9 C)  TempSrc:  Oral  Oral  SpO2: 100% 98%  98%  Weight:  79 kg  79 kg  Height:  5\' 6"  (1.676 m)      Intake/Output Summary (Last 24 hours) at 09/11/2023 0653 Last data filed at 09/11/2023 0400 Gross per 24 hour  Intake 480 ml  Output 2600 ml  Net -2120 ml      09/11/2023    4:12 AM 09/10/2023   10:02 PM 09/10/2023    3:57 AM  Last 3 Weights  Weight (lbs) 174 lb 1.6 oz 174 lb 1.6 oz 173 lb 15.1 oz  Weight (kg) 78.971 kg 78.971 kg 78.9 kg      Telemetry    Currently sinus in the 70s - Personally Reviewed  ECG    No new tracings - Personally Reviewed  Physical Exam   GEN: No acute distress.   Neck: No JVD Cardiac: RRR, no murmurs, rubs, or gallops.  Respiratory: Clear to auscultation bilaterally. GI: Soft, nontender, non-distended  MS: No edema; No deformity. Neuro:  Nonfocal  Psych: Normal affect   Labs    High Sensitivity Troponin:   Recent Labs  Lab  08/26/23 1337 08/26/23 2046 09/09/23 1750 09/09/23 1859  TROPONINIHS 38* 29* 25* 31*     Chemistry Recent Labs  Lab 09/09/23 1214 09/10/23 0412  NA 136 136  K 4.2 3.5  CL 101 101  CO2 23 25  GLUCOSE 118* 101*  BUN 22 19  CREATININE 1.24* 1.10*  CALCIUM 8.6* 8.1*  GFRNONAA 50* 57*  ANIONGAP 12 10    Lipids No results for input(s): "CHOL", "TRIG", "HDL", "LABVLDL", "LDLCALC", "CHOLHDL" in the last 168 hours.  Hematology Recent Labs  Lab 09/09/23 1214  WBC 6.3  RBC 3.91  HGB 11.6*  HCT 35.8*  MCV 91.6  MCH 29.7  MCHC 32.4  RDW 15.8*  PLT 451*   Thyroid  Recent Labs  Lab 09/09/23 1750  TSH 3.479    BNP Recent Labs  Lab 09/09/23 1750  BNP 3,202.0*    DDimer No results for input(s): "DDIMER" in the last 168 hours.   Radiology    DG Chest 2 View  Result Date: 09/09/2023 CLINICAL DATA:  Provided history: Irregular heart rate. Shortness of breath. History of atrial fibrillation. EXAM: CHEST - 2 VIEW COMPARISON:  Prior chest radiographs 08/26/2023 and earlier. FINDINGS: Cardiomegaly. Aortic  atherosclerosis. Central pulmonary vascular congestion without overt pulmonary edema. Progressive ill-defined opacity at the right lung base which may reflect atelectasis or pneumonia. Small right pleural effusion again noted. Small left pleural effusion, new from the prior examination of 08/26/2023. No evidence of pneumothorax. No acute osseous abnormality identified. Thoracic spondylosis. IMPRESSION: 1. Cardiomegaly. 2. Central pulmonary vascular congestion without overt pulmonary edema. 3. Ill-defined opacity within the right lung base, which may reflect atelectasis and/or pneumonia. 4. Small bilateral pleural effusions. 5.  Aortic Atherosclerosis (ICD10-I70.0). Electronically Signed   By: Jackey Loge D.O.   On: 09/09/2023 14:31    Cardiac Studies   Echo 08/27/23: 1. Left ventricular ejection fraction, by estimation, is 15-20%. The left  ventricle has severely decreased  function. The left ventricle demonstrates  global hypokinesis. There is mild concentric left ventricular hypertrophy.  Left ventricular diastolic  parameters are consistent with Grade III diastolic dysfunction  (restrictive).   2. Definity contrast shows loosely organized thrombotic material with  sluggish flow at LV apex, no obvious formed thrombus.   3. Right ventricular systolic function is moderately reduced. The right  ventricular size is normal. There is moderately elevated pulmonary artery  systolic pressure. The estimated right ventricular systolic pressure is  48.9 mmHg.   4. Left atrial size was moderately dilated.   5. Right atrial size was moderately dilated.   6. The mitral valve is degenerative. Trivial mitral valve regurgitation.   7. The aortic valve is tricuspid. Aortic valve regurgitation is not  visualized. Aortic valve sclerosis is present, with no evidence of aortic  valve stenosis.   Patient Profile     62 y.o. female wth NICM/HFrEF, atrial tachycardia, HTN, DM2, HTN, CKD 3a, and prior bradycardia presented with CHF exacerbation again found in atrial tachycardia despite amiodarone.   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 - echo with LVEF 15-20% with grade 3 DD - sluggish flow at the apex, no definite thrombus - BNP 3202 - LHC 01/2021 with no significant CAD - CM felt nonischemic and likely related to recurrent atrial tachycarda - GDMT limited by: no BB due to prior bradycardia, hx of hyperkalemia precluding alcactone - started on hydralazine - continue diuresing on 40 mg IV lasix - diuresed 2.6 L urine output yesterday, weight 174 lbs down from 177 lb on admission  Atrial tachycardia - appreciate EP - prior hospitalization, started on amiodarone PO  Hypertension - managed in the context of CHF  CKD stage 3a - sCr 1.10 - creatinine fluctuates 1.1-1.3  For questions or updates, please  contact [facility] Please consult [email or web address] for contact info under   Signed, Marcelino Duster, PA  09/11/2023, 6:53 AM    History and all data above reviewed.  Patient examined.  I agree with the findings as above.  With diuresis the patient says that her breathing is much improved.  The patient exam reveals COR:RRR  ,  Lungs: Clear  ,  Abd: Positive bowel sounds, no rebound no guarding, Ext No edema  .  All available labs, radiology testing, previous records reviewed. Agree with documented assessment and plan.  Acute on chronic systolic nad diastolic HF:  Excellent urine out put.  Unable to titrate meds secondary to tachybrady issues and previous hyperkalemia.  She does tolerate Entresto and I do not see any contraindication to up titration.  Potassium is actually low and we can follow her potassium closely.  I will increase this.  If she has hypotension we can reduce or stop the hydralazine started this admission.  Atrial tach:  EP has been consulted.  Continue the amiodarone.     Fayrene Fearing Wood Novacek  11:28 AM  09/11/2023

## 2023-09-11 NOTE — H&P (View-Only) (Signed)
Cardiology Consultation   Patient ID: Michelle Morrison MRN: [id number]; DOB: January 24, 1961  Admit date: 09/09/2023 Date of Consult: 09/11/2023  PCP:  [name], MD   [facility] Providers Cardiologist:  Dina Rich, MD  Electrophysiologist:  Regan Lemming, MD  { AHF: Dr. Shirlee Latch  Patient Profile:   Michelle Morrison is a 62 y.o. female with a hx of HTN, SVT, NICM, LBBB who is being seen 09/11/2023 for the evaluation of SVT at the request of Dr. Lowell Guitar.  History of Present Illness:   Michelle Morrison has hx of:  02/11/21, at [facility] with abdominal pain and distention.   --She was noted to be in atrial tachycardia with rate around 150.  She was hypotensive and got IVF, was also transiently on norepinephrine.  --- HR dropped transiently to the 40s with junctional bradycardia per report. AKI with creatinine up to 2.21.   --Echo in 3/22 showed EF 25-30%, moderate RV enlargement and moderately decreased RV systolic function with D-shaped septum, biatrial enlargement.  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  No bradycardia noted at [facility], and she remained in NSR on amiodarone.  EP thought that ablation of the atrial tachycardia would be difficult and recommended ongoing management with amiodarone.  --LHC/RHC after diuresis showed normal filling pressures, preserved cardiac output, and no significant CAD.   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   --9/22 showed EF up to 55-60% with mild LVH, normal RV, and PASP 21 mmHg.   November 2022 amiodarone >>> Multaq At her visit with Dr. Elberta Fortis, discussed ablation as an option, though on amiodarone unlikely to be able to induce during procedure. At subsequent visits, doing well without recurrent tachycardia  --03/2023 Echo 5/24 EF 40-45%  -- June 2024, monitor Predominant rhythm was sinus rhythm Min HR of 33 bpm, max HR of 187  bpm, and avg HR of 55 bpm 3 VT episodes, all less than 10 beats 51 SVT episodes fastest 187 bpm for 10 seconds, longest 35 seconds at 113 bpm Less than 1% ventricular and supraventricular ectopy Patient triggered episodes associated with sinus rhythm  7/23, Beta blocker stopped with mild bradycardia   Admitted 08/26/23: again w/abdominal discomfort again found in an Atach >> IV amiodarone, home multaq stopped LVEF markedly reduced 15-20% Discharged on amiodarone with plans for EP follow up  08/27/23: TTE LVEF 15-20%, Definity contrast shows loosely organized thrombotic material with sluggish flow at LV apex, no obvious formed thrombus   THIS ADMISSION Presented at [facility] ER with new SOB, cardiology consulted, noted again with her Atach and recommended hospital transfer for EP evaluation given recurrent tachycardia despite amiodarone. Maintained on her home PO amiodarone 200mg  daily (Not on BB 2/2 of bradycardia noted for her, some junctional rhythm at times, and unclear/perhaps some degree of AV dissociation, not on spiro with hx of hyperkalemia)  LABS K+ 4.1 >> 3.4 Mag 1.7 BUN/Creat 22/1.24 >> 1.08 BNP 3202 HS Trop 25, 31 WBC 6.3 H/h 11.6/35 Plts 451  She is feeling better with diuresis, no rest SOB any longer No CP She is vaguely aware of palpitations now and again, hasn't felt any here (but has had some tachycardias) No near syncope or syncope  Past Medical History:  Diagnosis Date   Atrial tachycardia (HCC)    CHF (congestive heart failure) (HCC)    a. EF 45% by echo in 03/2020 with normal cors by  Cardiology Consultation   Patient ID: Michelle Morrison MRN: [id number]; DOB: January 24, 1961  Admit date: 09/09/2023 Date of Consult: 09/11/2023  PCP:  [name], MD   [facility] Providers Cardiologist:  Dina Rich, MD  Electrophysiologist:  Regan Lemming, MD  { AHF: Dr. Shirlee Latch  Patient Profile:   Michelle Morrison is a 62 y.o. female with a hx of HTN, SVT, NICM, LBBB who is being seen 09/11/2023 for the evaluation of SVT at the request of Dr. Lowell Guitar.  History of Present Illness:   Michelle Morrison has hx of:  02/11/21, at [facility] with abdominal pain and distention.   --She was noted to be in atrial tachycardia with rate around 150.  She was hypotensive and got IVF, was also transiently on norepinephrine.  --- HR dropped transiently to the 40s with junctional bradycardia per report. AKI with creatinine up to 2.21.   --Echo in 3/22 showed EF 25-30%, moderate RV enlargement and moderately decreased RV systolic function with D-shaped septum, biatrial enlargement.  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  No bradycardia noted at [facility], and she remained in NSR on amiodarone.  EP thought that ablation of the atrial tachycardia would be difficult and recommended ongoing management with amiodarone.  --LHC/RHC after diuresis showed normal filling pressures, preserved cardiac output, and no significant CAD.   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   --9/22 showed EF up to 55-60% with mild LVH, normal RV, and PASP 21 mmHg.   November 2022 amiodarone >>> Multaq At her visit with Dr. Elberta Fortis, discussed ablation as an option, though on amiodarone unlikely to be able to induce during procedure. At subsequent visits, doing well without recurrent tachycardia  --03/2023 Echo 5/24 EF 40-45%  -- June 2024, monitor Predominant rhythm was sinus rhythm Min HR of 33 bpm, max HR of 187  bpm, and avg HR of 55 bpm 3 VT episodes, all less than 10 beats 51 SVT episodes fastest 187 bpm for 10 seconds, longest 35 seconds at 113 bpm Less than 1% ventricular and supraventricular ectopy Patient triggered episodes associated with sinus rhythm  7/23, Beta blocker stopped with mild bradycardia   Admitted 08/26/23: again w/abdominal discomfort again found in an Atach >> IV amiodarone, home multaq stopped LVEF markedly reduced 15-20% Discharged on amiodarone with plans for EP follow up  08/27/23: TTE LVEF 15-20%, Definity contrast shows loosely organized thrombotic material with sluggish flow at LV apex, no obvious formed thrombus   THIS ADMISSION Presented at [facility] ER with new SOB, cardiology consulted, noted again with her Atach and recommended hospital transfer for EP evaluation given recurrent tachycardia despite amiodarone. Maintained on her home PO amiodarone 200mg  daily (Not on BB 2/2 of bradycardia noted for her, some junctional rhythm at times, and unclear/perhaps some degree of AV dissociation, not on spiro with hx of hyperkalemia)  LABS K+ 4.1 >> 3.4 Mag 1.7 BUN/Creat 22/1.24 >> 1.08 BNP 3202 HS Trop 25, 31 WBC 6.3 H/h 11.6/35 Plts 451  She is feeling better with diuresis, no rest SOB any longer No CP She is vaguely aware of palpitations now and again, hasn't felt any here (but has had some tachycardias) No near syncope or syncope  Past Medical History:  Diagnosis Date   Atrial tachycardia (HCC)    CHF (congestive heart failure) (HCC)    a. EF 45% by echo in 03/2020 with normal cors by  Cardiology Consultation   Patient ID: Michelle Morrison MRN: [id number]; DOB: January 24, 1961  Admit date: 09/09/2023 Date of Consult: 09/11/2023  PCP:  [name], MD   [facility] Providers Cardiologist:  Dina Rich, MD  Electrophysiologist:  Regan Lemming, MD  { AHF: Dr. Shirlee Latch  Patient Profile:   Michelle Morrison is a 62 y.o. female with a hx of HTN, SVT, NICM, LBBB who is being seen 09/11/2023 for the evaluation of SVT at the request of Dr. Lowell Guitar.  History of Present Illness:   Michelle Morrison has hx of:  02/11/21, at [facility] with abdominal pain and distention.   --She was noted to be in atrial tachycardia with rate around 150.  She was hypotensive and got IVF, was also transiently on norepinephrine.  --- HR dropped transiently to the 40s with junctional bradycardia per report. AKI with creatinine up to 2.21.   --Echo in 3/22 showed EF 25-30%, moderate RV enlargement and moderately decreased RV systolic function with D-shaped septum, biatrial enlargement.  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  No bradycardia noted at [facility], and she remained in NSR on amiodarone.  EP thought that ablation of the atrial tachycardia would be difficult and recommended ongoing management with amiodarone.  --LHC/RHC after diuresis showed normal filling pressures, preserved cardiac output, and no significant CAD.   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   --9/22 showed EF up to 55-60% with mild LVH, normal RV, and PASP 21 mmHg.   November 2022 amiodarone >>> Multaq At her visit with Dr. Elberta Fortis, discussed ablation as an option, though on amiodarone unlikely to be able to induce during procedure. At subsequent visits, doing well without recurrent tachycardia  --03/2023 Echo 5/24 EF 40-45%  -- June 2024, monitor Predominant rhythm was sinus rhythm Min HR of 33 bpm, max HR of 187  bpm, and avg HR of 55 bpm 3 VT episodes, all less than 10 beats 51 SVT episodes fastest 187 bpm for 10 seconds, longest 35 seconds at 113 bpm Less than 1% ventricular and supraventricular ectopy Patient triggered episodes associated with sinus rhythm  7/23, Beta blocker stopped with mild bradycardia   Admitted 08/26/23: again w/abdominal discomfort again found in an Atach >> IV amiodarone, home multaq stopped LVEF markedly reduced 15-20% Discharged on amiodarone with plans for EP follow up  08/27/23: TTE LVEF 15-20%, Definity contrast shows loosely organized thrombotic material with sluggish flow at LV apex, no obvious formed thrombus   THIS ADMISSION Presented at [facility] ER with new SOB, cardiology consulted, noted again with her Atach and recommended hospital transfer for EP evaluation given recurrent tachycardia despite amiodarone. Maintained on her home PO amiodarone 200mg  daily (Not on BB 2/2 of bradycardia noted for her, some junctional rhythm at times, and unclear/perhaps some degree of AV dissociation, not on spiro with hx of hyperkalemia)  LABS K+ 4.1 >> 3.4 Mag 1.7 BUN/Creat 22/1.24 >> 1.08 BNP 3202 HS Trop 25, 31 WBC 6.3 H/h 11.6/35 Plts 451  She is feeling better with diuresis, no rest SOB any longer No CP She is vaguely aware of palpitations now and again, hasn't felt any here (but has had some tachycardias) No near syncope or syncope  Past Medical History:  Diagnosis Date   Atrial tachycardia (HCC)    CHF (congestive heart failure) (HCC)    a. EF 45% by echo in 03/2020 with normal cors by  Cardiology Consultation   Patient ID: Michelle Morrison MRN: [id number]; DOB: January 24, 1961  Admit date: 09/09/2023 Date of Consult: 09/11/2023  PCP:  [name], MD   [facility] Providers Cardiologist:  Dina Rich, MD  Electrophysiologist:  Regan Lemming, MD  { AHF: Dr. Shirlee Latch  Patient Profile:   Michelle Morrison is a 62 y.o. female with a hx of HTN, SVT, NICM, LBBB who is being seen 09/11/2023 for the evaluation of SVT at the request of Dr. Lowell Guitar.  History of Present Illness:   Michelle Morrison has hx of:  02/11/21, at [facility] with abdominal pain and distention.   --She was noted to be in atrial tachycardia with rate around 150.  She was hypotensive and got IVF, was also transiently on norepinephrine.  --- HR dropped transiently to the 40s with junctional bradycardia per report. AKI with creatinine up to 2.21.   --Echo in 3/22 showed EF 25-30%, moderate RV enlargement and moderately decreased RV systolic function with D-shaped septum, biatrial enlargement.  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  No bradycardia noted at [facility], and she remained in NSR on amiodarone.  EP thought that ablation of the atrial tachycardia would be difficult and recommended ongoing management with amiodarone.  --LHC/RHC after diuresis showed normal filling pressures, preserved cardiac output, and no significant CAD.   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   --9/22 showed EF up to 55-60% with mild LVH, normal RV, and PASP 21 mmHg.   November 2022 amiodarone >>> Multaq At her visit with Dr. Elberta Fortis, discussed ablation as an option, though on amiodarone unlikely to be able to induce during procedure. At subsequent visits, doing well without recurrent tachycardia  --03/2023 Echo 5/24 EF 40-45%  -- June 2024, monitor Predominant rhythm was sinus rhythm Min HR of 33 bpm, max HR of 187  bpm, and avg HR of 55 bpm 3 VT episodes, all less than 10 beats 51 SVT episodes fastest 187 bpm for 10 seconds, longest 35 seconds at 113 bpm Less than 1% ventricular and supraventricular ectopy Patient triggered episodes associated with sinus rhythm  7/23, Beta blocker stopped with mild bradycardia   Admitted 08/26/23: again w/abdominal discomfort again found in an Atach >> IV amiodarone, home multaq stopped LVEF markedly reduced 15-20% Discharged on amiodarone with plans for EP follow up  08/27/23: TTE LVEF 15-20%, Definity contrast shows loosely organized thrombotic material with sluggish flow at LV apex, no obvious formed thrombus   THIS ADMISSION Presented at [facility] ER with new SOB, cardiology consulted, noted again with her Atach and recommended hospital transfer for EP evaluation given recurrent tachycardia despite amiodarone. Maintained on her home PO amiodarone 200mg  daily (Not on BB 2/2 of bradycardia noted for her, some junctional rhythm at times, and unclear/perhaps some degree of AV dissociation, not on spiro with hx of hyperkalemia)  LABS K+ 4.1 >> 3.4 Mag 1.7 BUN/Creat 22/1.24 >> 1.08 BNP 3202 HS Trop 25, 31 WBC 6.3 H/h 11.6/35 Plts 451  She is feeling better with diuresis, no rest SOB any longer No CP She is vaguely aware of palpitations now and again, hasn't felt any here (but has had some tachycardias) No near syncope or syncope  Past Medical History:  Diagnosis Date   Atrial tachycardia (HCC)    CHF (congestive heart failure) (HCC)    a. EF 45% by echo in 03/2020 with normal cors by  Cardiology Consultation   Patient ID: Michelle Morrison MRN: [id number]; DOB: January 24, 1961  Admit date: 09/09/2023 Date of Consult: 09/11/2023  PCP:  [name], MD   [facility] Providers Cardiologist:  Dina Rich, MD  Electrophysiologist:  Regan Lemming, MD  { AHF: Dr. Shirlee Latch  Patient Profile:   Michelle Morrison is a 62 y.o. female with a hx of HTN, SVT, NICM, LBBB who is being seen 09/11/2023 for the evaluation of SVT at the request of Dr. Lowell Guitar.  History of Present Illness:   Michelle Morrison has hx of:  02/11/21, at [facility] with abdominal pain and distention.   --She was noted to be in atrial tachycardia with rate around 150.  She was hypotensive and got IVF, was also transiently on norepinephrine.  --- HR dropped transiently to the 40s with junctional bradycardia per report. AKI with creatinine up to 2.21.   --Echo in 3/22 showed EF 25-30%, moderate RV enlargement and moderately decreased RV systolic function with D-shaped septum, biatrial enlargement.  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  No bradycardia noted at [facility], and she remained in NSR on amiodarone.  EP thought that ablation of the atrial tachycardia would be difficult and recommended ongoing management with amiodarone.  --LHC/RHC after diuresis showed normal filling pressures, preserved cardiac output, and no significant CAD.   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   --9/22 showed EF up to 55-60% with mild LVH, normal RV, and PASP 21 mmHg.   November 2022 amiodarone >>> Multaq At her visit with Dr. Elberta Fortis, discussed ablation as an option, though on amiodarone unlikely to be able to induce during procedure. At subsequent visits, doing well without recurrent tachycardia  --03/2023 Echo 5/24 EF 40-45%  -- June 2024, monitor Predominant rhythm was sinus rhythm Min HR of 33 bpm, max HR of 187  bpm, and avg HR of 55 bpm 3 VT episodes, all less than 10 beats 51 SVT episodes fastest 187 bpm for 10 seconds, longest 35 seconds at 113 bpm Less than 1% ventricular and supraventricular ectopy Patient triggered episodes associated with sinus rhythm  7/23, Beta blocker stopped with mild bradycardia   Admitted 08/26/23: again w/abdominal discomfort again found in an Atach >> IV amiodarone, home multaq stopped LVEF markedly reduced 15-20% Discharged on amiodarone with plans for EP follow up  08/27/23: TTE LVEF 15-20%, Definity contrast shows loosely organized thrombotic material with sluggish flow at LV apex, no obvious formed thrombus   THIS ADMISSION Presented at [facility] ER with new SOB, cardiology consulted, noted again with her Atach and recommended hospital transfer for EP evaluation given recurrent tachycardia despite amiodarone. Maintained on her home PO amiodarone 200mg  daily (Not on BB 2/2 of bradycardia noted for her, some junctional rhythm at times, and unclear/perhaps some degree of AV dissociation, not on spiro with hx of hyperkalemia)  LABS K+ 4.1 >> 3.4 Mag 1.7 BUN/Creat 22/1.24 >> 1.08 BNP 3202 HS Trop 25, 31 WBC 6.3 H/h 11.6/35 Plts 451  She is feeling better with diuresis, no rest SOB any longer No CP She is vaguely aware of palpitations now and again, hasn't felt any here (but has had some tachycardias) No near syncope or syncope  Past Medical History:  Diagnosis Date   Atrial tachycardia (HCC)    CHF (congestive heart failure) (HCC)    a. EF 45% by echo in 03/2020 with normal cors by

## 2023-09-11 NOTE — Discharge Instructions (Addendum)
You are scheduled for your ablation procedure with Dr. Ladona Ridgel on 09/30/23. Continue your amiodarone 200mg  daily until 10/23/23 then stop Our office will place your procedure instructions in your MyChart and contact you to ensure you have received them Please call our office if you have not gotten the instructions or heard from [place] in the next week.  Dr. Lubertha Basque office number is [phone]

## 2023-09-11 NOTE — Consult Note (Addendum)
Cardiology Consultation   Patient ID: Michelle Morrison MRN: [id number]; DOB: January 24, 1961  Admit date: 09/09/2023 Date of Consult: 09/11/2023  PCP:  [name], MD   [facility] Providers Cardiologist:  Dina Rich, MD  Electrophysiologist:  Regan Lemming, MD  { AHF: Dr. Shirlee Latch  Patient Profile:   Michelle Morrison is a 62 y.o. female with a hx of HTN, SVT, NICM, LBBB who is being seen 09/11/2023 for the evaluation of SVT at the request of Dr. Lowell Guitar.  History of Present Illness:   [name] has hx of:  02/11/21, at [facility] with abdominal pain and distention.   --She was noted to be in atrial tachycardia with rate around 150.  She was hypotensive and got IVF, was also transiently on norepinephrine.  --- HR dropped transiently to the 40s with junctional bradycardia per report. AKI with creatinine up to 2.21.   --Echo in 3/22 showed EF 25-30%, moderate RV enlargement and moderately decreased RV systolic function with D-shaped septum, biatrial enlargement.  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  No bradycardia noted at [facility], and she remained in NSR on amiodarone.  EP thought that ablation of the atrial tachycardia would be difficult and recommended ongoing management with amiodarone.  --LHC/RHC after diuresis showed normal filling pressures, preserved cardiac output, and no significant CAD.   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   --9/22 showed EF up to 55-60% with mild LVH, normal RV, and PASP 21 mmHg.   November 2022 amiodarone >>> Multaq At her visit with Dr. Elberta Fortis, discussed ablation as an option, though on amiodarone unlikely to be able to induce during procedure. At subsequent visits, doing well without recurrent tachycardia  --03/2023 Echo 5/24 EF 40-45%  -- June 2024, monitor Predominant rhythm was sinus rhythm Min HR of 33 bpm, max HR of 187  bpm, and avg HR of 55 bpm 3 VT episodes, all less than 10 beats 51 SVT episodes fastest 187 bpm for 10 seconds, longest 35 seconds at 113 bpm Less than 1% ventricular and supraventricular ectopy Patient triggered episodes associated with sinus rhythm  7/23, Beta blocker stopped with mild bradycardia   Admitted 08/26/23: again w/abdominal discomfort again found in an Atach >> IV amiodarone, home multaq stopped LVEF markedly reduced 15-20% Discharged on amiodarone with plans for EP follow up  08/27/23: TTE LVEF 15-20%, Definity contrast shows loosely organized thrombotic material with sluggish flow at LV apex, no obvious formed thrombus   THIS ADMISSION Presented at [facility] ER with new SOB, cardiology consulted, noted again with her Atach and recommended hospital transfer for EP evaluation given recurrent tachycardia despite amiodarone. Maintained on her home PO amiodarone 200mg  daily (Not on BB 2/2 of bradycardia noted for her, some junctional rhythm at times, and unclear/perhaps some degree of AV dissociation, not on spiro with hx of hyperkalemia)  LABS K+ 4.1 >> 3.4 Mag 1.7 BUN/Creat 22/1.24 >> 1.08 BNP 3202 HS Trop 25, 31 WBC 6.3 H/h 11.6/35 Plts 451  She is feeling better with diuresis, no rest SOB any longer No CP She is vaguely aware of palpitations now and again, hasn't felt any here (but has had some tachycardias) No near syncope or syncope  Past Medical History:  Diagnosis Date   Atrial tachycardia (HCC)    CHF (congestive heart failure) (HCC)    a. EF 45% by echo in 03/2020 with normal cors by  Cardiology Consultation   Patient ID: Michelle Morrison MRN: [id number]; DOB: January 24, 1961  Admit date: 09/09/2023 Date of Consult: 09/11/2023  PCP:  [name], MD   [facility] Providers Cardiologist:  Dina Rich, MD  Electrophysiologist:  Regan Lemming, MD  { AHF: Dr. Shirlee Latch  Patient Profile:   Michelle Morrison is a 62 y.o. female with a hx of HTN, SVT, NICM, LBBB who is being seen 09/11/2023 for the evaluation of SVT at the request of Dr. Lowell Guitar.  History of Present Illness:   [name] has hx of:  02/11/21, at [facility] with abdominal pain and distention.   --She was noted to be in atrial tachycardia with rate around 150.  She was hypotensive and got IVF, was also transiently on norepinephrine.  --- HR dropped transiently to the 40s with junctional bradycardia per report. AKI with creatinine up to 2.21.   --Echo in 3/22 showed EF 25-30%, moderate RV enlargement and moderately decreased RV systolic function with D-shaped septum, biatrial enlargement.  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  No bradycardia noted at [facility], and she remained in NSR on amiodarone.  EP thought that ablation of the atrial tachycardia would be difficult and recommended ongoing management with amiodarone.  --LHC/RHC after diuresis showed normal filling pressures, preserved cardiac output, and no significant CAD.   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   --9/22 showed EF up to 55-60% with mild LVH, normal RV, and PASP 21 mmHg.   November 2022 amiodarone >>> Multaq At her visit with Dr. Elberta Fortis, discussed ablation as an option, though on amiodarone unlikely to be able to induce during procedure. At subsequent visits, doing well without recurrent tachycardia  --03/2023 Echo 5/24 EF 40-45%  -- June 2024, monitor Predominant rhythm was sinus rhythm Min HR of 33 bpm, max HR of 187  bpm, and avg HR of 55 bpm 3 VT episodes, all less than 10 beats 51 SVT episodes fastest 187 bpm for 10 seconds, longest 35 seconds at 113 bpm Less than 1% ventricular and supraventricular ectopy Patient triggered episodes associated with sinus rhythm  7/23, Beta blocker stopped with mild bradycardia   Admitted 08/26/23: again w/abdominal discomfort again found in an Atach >> IV amiodarone, home multaq stopped LVEF markedly reduced 15-20% Discharged on amiodarone with plans for EP follow up  08/27/23: TTE LVEF 15-20%, Definity contrast shows loosely organized thrombotic material with sluggish flow at LV apex, no obvious formed thrombus   THIS ADMISSION Presented at [facility] ER with new SOB, cardiology consulted, noted again with her Atach and recommended hospital transfer for EP evaluation given recurrent tachycardia despite amiodarone. Maintained on her home PO amiodarone 200mg  daily (Not on BB 2/2 of bradycardia noted for her, some junctional rhythm at times, and unclear/perhaps some degree of AV dissociation, not on spiro with hx of hyperkalemia)  LABS K+ 4.1 >> 3.4 Mag 1.7 BUN/Creat 22/1.24 >> 1.08 BNP 3202 HS Trop 25, 31 WBC 6.3 H/h 11.6/35 Plts 451  She is feeling better with diuresis, no rest SOB any longer No CP She is vaguely aware of palpitations now and again, hasn't felt any here (but has had some tachycardias) No near syncope or syncope  Past Medical History:  Diagnosis Date   Atrial tachycardia (HCC)    CHF (congestive heart failure) (HCC)    a. EF 45% by echo in 03/2020 with normal cors by  Cardiology Consultation   Patient ID: Michelle Morrison MRN: [id number]; DOB: January 24, 1961  Admit date: 09/09/2023 Date of Consult: 09/11/2023  PCP:  [name], MD   [facility] Providers Cardiologist:  Dina Rich, MD  Electrophysiologist:  Regan Lemming, MD  { AHF: Dr. Shirlee Latch  Patient Profile:   Michelle Morrison is a 62 y.o. female with a hx of HTN, SVT, NICM, LBBB who is being seen 09/11/2023 for the evaluation of SVT at the request of Dr. Lowell Guitar.  History of Present Illness:   [name] has hx of:  02/11/21, at [facility] with abdominal pain and distention.   --She was noted to be in atrial tachycardia with rate around 150.  She was hypotensive and got IVF, was also transiently on norepinephrine.  --- HR dropped transiently to the 40s with junctional bradycardia per report. AKI with creatinine up to 2.21.   --Echo in 3/22 showed EF 25-30%, moderate RV enlargement and moderately decreased RV systolic function with D-shaped septum, biatrial enlargement.  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  No bradycardia noted at [facility], and she remained in NSR on amiodarone.  EP thought that ablation of the atrial tachycardia would be difficult and recommended ongoing management with amiodarone.  --LHC/RHC after diuresis showed normal filling pressures, preserved cardiac output, and no significant CAD.   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   --9/22 showed EF up to 55-60% with mild LVH, normal RV, and PASP 21 mmHg.   November 2022 amiodarone >>> Multaq At her visit with Dr. Elberta Fortis, discussed ablation as an option, though on amiodarone unlikely to be able to induce during procedure. At subsequent visits, doing well without recurrent tachycardia  --03/2023 Echo 5/24 EF 40-45%  -- June 2024, monitor Predominant rhythm was sinus rhythm Min HR of 33 bpm, max HR of 187  bpm, and avg HR of 55 bpm 3 VT episodes, all less than 10 beats 51 SVT episodes fastest 187 bpm for 10 seconds, longest 35 seconds at 113 bpm Less than 1% ventricular and supraventricular ectopy Patient triggered episodes associated with sinus rhythm  7/23, Beta blocker stopped with mild bradycardia   Admitted 08/26/23: again w/abdominal discomfort again found in an Atach >> IV amiodarone, home multaq stopped LVEF markedly reduced 15-20% Discharged on amiodarone with plans for EP follow up  08/27/23: TTE LVEF 15-20%, Definity contrast shows loosely organized thrombotic material with sluggish flow at LV apex, no obvious formed thrombus   THIS ADMISSION Presented at [facility] ER with new SOB, cardiology consulted, noted again with her Atach and recommended hospital transfer for EP evaluation given recurrent tachycardia despite amiodarone. Maintained on her home PO amiodarone 200mg  daily (Not on BB 2/2 of bradycardia noted for her, some junctional rhythm at times, and unclear/perhaps some degree of AV dissociation, not on spiro with hx of hyperkalemia)  LABS K+ 4.1 >> 3.4 Mag 1.7 BUN/Creat 22/1.24 >> 1.08 BNP 3202 HS Trop 25, 31 WBC 6.3 H/h 11.6/35 Plts 451  She is feeling better with diuresis, no rest SOB any longer No CP She is vaguely aware of palpitations now and again, hasn't felt any here (but has had some tachycardias) No near syncope or syncope  Past Medical History:  Diagnosis Date   Atrial tachycardia (HCC)    CHF (congestive heart failure) (HCC)    a. EF 45% by echo in 03/2020 with normal cors by  Cardiology Consultation   Patient ID: Michelle Morrison MRN: [id number]; DOB: January 24, 1961  Admit date: 09/09/2023 Date of Consult: 09/11/2023  PCP:  [name], MD   [facility] Providers Cardiologist:  Dina Rich, MD  Electrophysiologist:  Regan Lemming, MD  { AHF: Dr. Shirlee Latch  Patient Profile:   Michelle Morrison is a 62 y.o. female with a hx of HTN, SVT, NICM, LBBB who is being seen 09/11/2023 for the evaluation of SVT at the request of Dr. Lowell Guitar.  History of Present Illness:   [name] has hx of:  02/11/21, at [facility] with abdominal pain and distention.   --She was noted to be in atrial tachycardia with rate around 150.  She was hypotensive and got IVF, was also transiently on norepinephrine.  --- HR dropped transiently to the 40s with junctional bradycardia per report. AKI with creatinine up to 2.21.   --Echo in 3/22 showed EF 25-30%, moderate RV enlargement and moderately decreased RV systolic function with D-shaped septum, biatrial enlargement.  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  No bradycardia noted at [facility], and she remained in NSR on amiodarone.  EP thought that ablation of the atrial tachycardia would be difficult and recommended ongoing management with amiodarone.  --LHC/RHC after diuresis showed normal filling pressures, preserved cardiac output, and no significant CAD.   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   --9/22 showed EF up to 55-60% with mild LVH, normal RV, and PASP 21 mmHg.   November 2022 amiodarone >>> Multaq At her visit with Dr. Elberta Fortis, discussed ablation as an option, though on amiodarone unlikely to be able to induce during procedure. At subsequent visits, doing well without recurrent tachycardia  --03/2023 Echo 5/24 EF 40-45%  -- June 2024, monitor Predominant rhythm was sinus rhythm Min HR of 33 bpm, max HR of 187  bpm, and avg HR of 55 bpm 3 VT episodes, all less than 10 beats 51 SVT episodes fastest 187 bpm for 10 seconds, longest 35 seconds at 113 bpm Less than 1% ventricular and supraventricular ectopy Patient triggered episodes associated with sinus rhythm  7/23, Beta blocker stopped with mild bradycardia   Admitted 08/26/23: again w/abdominal discomfort again found in an Atach >> IV amiodarone, home multaq stopped LVEF markedly reduced 15-20% Discharged on amiodarone with plans for EP follow up  08/27/23: TTE LVEF 15-20%, Definity contrast shows loosely organized thrombotic material with sluggish flow at LV apex, no obvious formed thrombus   THIS ADMISSION Presented at [facility] ER with new SOB, cardiology consulted, noted again with her Atach and recommended hospital transfer for EP evaluation given recurrent tachycardia despite amiodarone. Maintained on her home PO amiodarone 200mg  daily (Not on BB 2/2 of bradycardia noted for her, some junctional rhythm at times, and unclear/perhaps some degree of AV dissociation, not on spiro with hx of hyperkalemia)  LABS K+ 4.1 >> 3.4 Mag 1.7 BUN/Creat 22/1.24 >> 1.08 BNP 3202 HS Trop 25, 31 WBC 6.3 H/h 11.6/35 Plts 451  She is feeling better with diuresis, no rest SOB any longer No CP She is vaguely aware of palpitations now and again, hasn't felt any here (but has had some tachycardias) No near syncope or syncope  Past Medical History:  Diagnosis Date   Atrial tachycardia (HCC)    CHF (congestive heart failure) (HCC)    a. EF 45% by echo in 03/2020 with normal cors by  Cardiology Consultation   Patient ID: Michelle Morrison MRN: [id number]; DOB: January 24, 1961  Admit date: 09/09/2023 Date of Consult: 09/11/2023  PCP:  [name], MD   [facility] Providers Cardiologist:  Dina Rich, MD  Electrophysiologist:  Regan Lemming, MD  { AHF: Dr. Shirlee Latch  Patient Profile:   Michelle Morrison is a 62 y.o. female with a hx of HTN, SVT, NICM, LBBB who is being seen 09/11/2023 for the evaluation of SVT at the request of Dr. Lowell Guitar.  History of Present Illness:   [name] has hx of:  02/11/21, at [facility] with abdominal pain and distention.   --She was noted to be in atrial tachycardia with rate around 150.  She was hypotensive and got IVF, was also transiently on norepinephrine.  --- HR dropped transiently to the 40s with junctional bradycardia per report. AKI with creatinine up to 2.21.   --Echo in 3/22 showed EF 25-30%, moderate RV enlargement and moderately decreased RV systolic function with D-shaped septum, biatrial enlargement.  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  No bradycardia noted at [facility], and she remained in NSR on amiodarone.  EP thought that ablation of the atrial tachycardia would be difficult and recommended ongoing management with amiodarone.  --LHC/RHC after diuresis showed normal filling pressures, preserved cardiac output, and no significant CAD.   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   --9/22 showed EF up to 55-60% with mild LVH, normal RV, and PASP 21 mmHg.   November 2022 amiodarone >>> Multaq At her visit with Dr. Elberta Fortis, discussed ablation as an option, though on amiodarone unlikely to be able to induce during procedure. At subsequent visits, doing well without recurrent tachycardia  --03/2023 Echo 5/24 EF 40-45%  -- June 2024, monitor Predominant rhythm was sinus rhythm Min HR of 33 bpm, max HR of 187  bpm, and avg HR of 55 bpm 3 VT episodes, all less than 10 beats 51 SVT episodes fastest 187 bpm for 10 seconds, longest 35 seconds at 113 bpm Less than 1% ventricular and supraventricular ectopy Patient triggered episodes associated with sinus rhythm  7/23, Beta blocker stopped with mild bradycardia   Admitted 08/26/23: again w/abdominal discomfort again found in an Atach >> IV amiodarone, home multaq stopped LVEF markedly reduced 15-20% Discharged on amiodarone with plans for EP follow up  08/27/23: TTE LVEF 15-20%, Definity contrast shows loosely organized thrombotic material with sluggish flow at LV apex, no obvious formed thrombus   THIS ADMISSION Presented at [facility] ER with new SOB, cardiology consulted, noted again with her Atach and recommended hospital transfer for EP evaluation given recurrent tachycardia despite amiodarone. Maintained on her home PO amiodarone 200mg  daily (Not on BB 2/2 of bradycardia noted for her, some junctional rhythm at times, and unclear/perhaps some degree of AV dissociation, not on spiro with hx of hyperkalemia)  LABS K+ 4.1 >> 3.4 Mag 1.7 BUN/Creat 22/1.24 >> 1.08 BNP 3202 HS Trop 25, 31 WBC 6.3 H/h 11.6/35 Plts 451  She is feeling better with diuresis, no rest SOB any longer No CP She is vaguely aware of palpitations now and again, hasn't felt any here (but has had some tachycardias) No near syncope or syncope  Past Medical History:  Diagnosis Date   Atrial tachycardia (HCC)    CHF (congestive heart failure) (HCC)    a. EF 45% by echo in 03/2020 with normal cors by

## 2023-09-11 NOTE — Progress Notes (Signed)
Mobility Specialist Progress Note:   09/11/23 1200  Mobility  Activity Ambulated with assistance in hallway  Level of Assistance Contact guard assist, steadying assist  Assistive Device None  Distance Ambulated (ft) 420 ft  Activity Response Tolerated well  Mobility Referral Yes  $Mobility charge 1 Mobility  Mobility Specialist Start Time (ACUTE ONLY) 1140  Mobility Specialist Stop Time (ACUTE ONLY) 1150  Mobility Specialist Time Calculation (min) (ACUTE ONLY) 10 min    Received pt in chair having no complaints and agreeable to mobility. Pt was asymptomatic throughout ambulation and returned to room w/o fault. Left in chair w/ call bell in reach and all needs met. Family present.   D'Vante Earlene Plater Mobility Specialist Please contact via Special educational needs teacher or Rehab office at [phone]

## 2023-09-12 DIAGNOSIS — I4719 Other supraventricular tachycardia: Secondary | ICD-10-CM | POA: Diagnosis not present

## 2023-09-12 DIAGNOSIS — I5043 Acute on chronic combined systolic (congestive) and diastolic (congestive) heart failure: Secondary | ICD-10-CM | POA: Diagnosis not present

## 2023-09-12 LAB — COMPREHENSIVE METABOLIC PANEL
ALT: 15 U/L (ref 0–44)
AST: 20 U/L (ref 15–41)
Albumin: 2.9 g/dL — ABNORMAL LOW (ref 3.5–5.0)
Alkaline Phosphatase: 75 U/L (ref 38–126)
Anion gap: 12 (ref 5–15)
BUN: 13 mg/dL (ref 8–23)
CO2: 27 mmol/L (ref 22–32)
Calcium: 8 mg/dL — ABNORMAL LOW (ref 8.9–10.3)
Chloride: 97 mmol/L — ABNORMAL LOW (ref 98–111)
Creatinine, Ser: 0.93 mg/dL (ref 0.44–1.00)
GFR, Estimated: >60 mL/min (ref >60)
Glucose, Bld: 100 mg/dL — ABNORMAL HIGH (ref 70–99)
Potassium: 4 mmol/L (ref 3.5–5.1)
Sodium: 136 mmol/L (ref 135–145)
Total Bilirubin: 0.7 mg/dL (ref 0.3–1.2)
Total Protein: 7.1 g/dL (ref 6.5–8.1)

## 2023-09-12 LAB — CBC WITH DIFFERENTIAL/PLATELET
Abs Immature Granulocytes: 0.02 10*3/uL (ref 0.00–0.07)
Basophils Absolute: 0 10*3/uL (ref 0.0–0.1)
Basophils Relative: 1 %
Eosinophils Absolute: 0.2 10*3/uL (ref 0.0–0.5)
Eosinophils Relative: 3 %
HCT: 36.8 % (ref 36.0–46.0)
Hemoglobin: 12.1 g/dL (ref 12.0–15.0)
Immature Granulocytes: 0 %
Lymphocytes Relative: 21 %
Lymphs Abs: 1.3 10*3/uL (ref 0.7–4.0)
MCH: 29.7 pg (ref 26.0–34.0)
MCHC: 32.9 g/dL (ref 30.0–36.0)
MCV: 90.2 fL (ref 80.0–100.0)
Monocytes Absolute: 0.6 10*3/uL (ref 0.1–1.0)
Monocytes Relative: 9 %
Neutro Abs: 4 10*3/uL (ref 1.7–7.7)
Neutrophils Relative %: 66 %
Platelets: 418 10*3/uL — ABNORMAL HIGH (ref 150–400)
RBC: 4.08 MIL/uL (ref 3.87–5.11)
RDW: 16 % — ABNORMAL HIGH (ref 11.5–15.5)
WBC: 6.1 10*3/uL (ref 4.0–10.5)
nRBC: 0 % (ref 0.0–0.2)

## 2023-09-12 LAB — GLUCOSE, CAPILLARY
Glucose-Capillary: 116 mg/dL — ABNORMAL HIGH (ref 70–99)
Glucose-Capillary: 117 mg/dL — ABNORMAL HIGH (ref 70–99)
Glucose-Capillary: 111 mg/dL — ABNORMAL HIGH (ref 70–99)
Glucose-Capillary: 125 mg/dL — ABNORMAL HIGH (ref 70–99)
Glucose-Capillary: 107 mg/dL — ABNORMAL HIGH (ref 70–99)

## 2023-09-12 LAB — PHOSPHORUS: Phosphorus: 3.5 mg/dL (ref 2.5–4.6)

## 2023-09-12 LAB — MAGNESIUM: Magnesium: 1.9 mg/dL (ref 1.7–2.4)

## 2023-09-12 NOTE — Care Management Important Message (Signed)
Important Message  Patient Details  Name: Michelle Morrison MRN: [id number] Date of Birth: August 03, 1961   Important Message Given:  Yes - Medicare IM     Sherilyn Banker 09/12/2023, 2:05 PM

## 2023-09-12 NOTE — TOC Initial Note (Signed)
Transition of Care [facility]) - Initial/Assessment Note    Patient Details  Name: Michelle Morrison MRN: [id number] Date of Birth: 14-Feb-1961  [facility] CM/SW Contact:    Gala Lewandowsky, RN Phone Number: 09/12/2023, 11:17 AM  Clinical Narrative:  Patient presented for atrial tachycardia. PTA patient was from home with support of daughter. Patient has DME rolling walker. Patient has a PCP and she gets to appointments without any issues. Case Manager discussed home health services and patient states she takes her medications appropriately. Case Manager will continue to follow for additional transition of care needs as the patient progresses.                   Expected Discharge Plan: Home/Self Care Barriers to Discharge: Continued Medical Work up   Patient Goals and CMS Choice Patient states their goals for this hospitalization and ongoing recovery are:: to return home   Expected Discharge Plan and Services   Discharge Planning Services: CM Consult Post Acute Care Choice: NA Living arrangements for the past 2 months: Apartment  Prior Living Arrangements/Services Living arrangements for the past 2 months: Apartment Lives with:: Adult Children Patient language and need for interpreter reviewed:: Yes Do you feel safe going back to the place where you live?: Yes      Need for Family Participation in Patient Care: Yes (Comment) Care giver support system in place?: Yes (comment) Current home services: DME (rolling walker) Criminal Activity/Legal Involvement Pertinent to Current Situation/Hospitalization: No - Comment as needed  Activities of Daily Living   ADL Screening (condition at time of admission) Independently performs ADLs?: Yes (appropriate for developmental age) Is the patient deaf or have difficulty hearing?: No Does the patient have difficulty seeing, even when wearing glasses/contacts?: No Does the patient have difficulty concentrating, remembering,  or making decisions?: No  Permission Sought/Granted Permission sought to share information with : Case Manager, Family Supports Permission granted to share information with : Yes, Verbal Permission Granted   Emotional Assessment       Orientation: : Oriented to Self, Oriented to Place, Oriented to  Time, Oriented to Situation Alcohol / Substance Use: Not Applicable Psych Involvement: No (comment)  Admission diagnosis:  Atrial tachycardia (HCC) [I47.19] Acute on chronic HFrEF (heart failure with reduced ejection fraction) (HCC) [I50.23] Patient Active Problem List   Diagnosis Date Noted   Chronic kidney disease, stage 3a (HCC) 09/10/2023   Acute on chronic HFrEF (heart failure with reduced ejection fraction) (HCC) 09/10/2023   Atrial tachycardia (HCC) 08/27/2023   Atrial flutter with rapid ventricular response (HCC) 08/27/2023   Nausea and vomiting 08/26/2023   RUQ pain 08/26/2023   Epigastric pain 08/26/2023   S/P cholecystectomy 08/26/2023   Atrial flutter (HCC) 08/26/2023   Chronic systolic CHF (congestive heart failure) (HCC) 08/26/2023   ASCUS with positive high risk HPV cervical 08/08/2023   Pelvic pain 08/06/2023   Routine Papanicolaou smear 08/06/2023   Atypical squamous cells of undetermined significance on cytologic smear of cervix (ASC-US) 08/06/2023   Snoring 05/16/2023   Atrial tachycardia, paroxysmal (HCC) 02/12/2021   Paroxysmal A-fib/Flutter(HCC) 02/11/2021   Diabetes mellitus type 2 with complications (HCC) 02/11/2021   Atrial fibrillation with RVR (HCC) 02/11/2021   Acute on chronic combined systolic and diastolic CHF (congestive heart failure) (HCC) 06/09/2020   Hypertensive urgency    Elevated troponin    Dyspnea due to congestive heart failure (HCC) 04/13/2020   Accelerated hypertension 04/13/2020   Hyperglycemia due to diabetes mellitus (HCC) 04/13/2020  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 [place] [address] Phone: [phone] Fax: [phone]  Social Determinants of Health (SDOH) Social History: SDOH Screenings   Food Insecurity: No Food Insecurity (09/09/2023)  Housing: Low Risk  (09/09/2023)  Transportation Needs: No Transportation Needs (09/09/2023)  Utilities: Not At Risk (09/09/2023)  Alcohol Screen: Low Risk  (02/27/2021)  Depression (PHQ2-9): Low Risk  (02/27/2021)  Financial Resource Strain: Low Risk  (02/27/2021)  Recent Concern: Financial Resource Strain - Medium Risk (02/19/2021)  Physical Activity: Sufficiently Active (02/27/2021)  Social Connections: Unknown (02/27/2021)  Stress: No Stress Concern Present (02/27/2021)  Tobacco Use: Low Risk  (09/09/2023)   Readmission Risk Interventions     No data to display

## 2023-09-12 NOTE — Progress Notes (Signed)
PROGRESS NOTE    Michelle Morrison  PIR:[id number] DOB: 07/30/1961 DOA: 09/09/2023 PCP: [name], MD  Chief Complaint  Patient presents with   Irregular Heart Beat    Brief Narrative:   62 year old female with Michelle Morrison history of HFrEF, atrial tachycardia, hypertension, diabetes mellitus type 2, CKD stage III, hyperlipidemia presenting with complaints of  feeling her heart rate "going up and down".  She denies any frank chest pain, reports some dyspnea on exertion.  She denies any fevers, chills, cough, hemoptysis, nausea, vomit, diarrhea, abdominal pain.  The patient does report some weight gain up to 177 pounds.  Notably, the patient had an office weight of 168 pounds in the cardiology office on 08/07/2023.   Recent hospitalization  10/1 to 10/6-heart rate initially in the 160s, started on amiodarone, evaluated by cardiology.  Discharged on amiodarone 200 mg twice daily.   Patient reports she was taking the medication as prescribed since discharge, but then she called her cardiologist office because of difficulty breathing, weakness, hot flashes, easy fatigability and feeling bad overall.  It was recommended that she reduce her dose of amiodarone to 200 mg daily.  She started taking the reduced dose yesterday, and today she started having symptoms of racing heart rate. ED Course: Tachycardic heart rate 112-125.  Respirate rate 20-28.  Temperature 98.  Blood pressure systolic 150s to 630Z.  O2 sats greater than 93% on room air. BNP elevated at 3202.  Troponin 25 Chest x-ray with central pulmonary vascular congestion, right lower base opacity atelectasis or pneumonia.   The patient was seen by cardiology.  They recommended transfer to [facility] for electrophysiology to consult for the patient's atrial tachycardia.  Assessment & Plan:   Principal Problem:   Atrial tachycardia (HCC) Active Problems:   Acute on chronic combined systolic and diastolic CHF (congestive heart failure)  (HCC)   Diabetes mellitus type 2 with complications (HCC)   HTN (hypertension), benign   Chronic kidney disease, stage 3a (HCC)   Acute on chronic HFrEF (heart failure with reduced ejection fraction) (HCC)  Atrial tachycardia (HCC) - appreciate cards recs -> appears she's been scheduled for ablation on 11/5 - amiodarone (continue 200 mg daily with plan to stop 10/23/2023)   Acute on chronic HFrEF -  CXR with central pulm vascular congestion, ill defined opacity within R lung base, bilateral effusions  - 08/27/23  echo EF of 15 to 20% with grade 3 DD (restrictive) - diuresis per cardiology (transition to PO in AM) - Continue Imdur and Entresto hydralazine added - No beta-blocker secondary to prior bradycardia, no aldactone due to hyperkalemia   Diabetes mellitus type 2 with complications (HCC) -08/26/23--A1c 6.5. -Hold Farxiga, Tradjenta -SSI    HTN (hypertension), benign -continue hydralazine and imdur -continue Entresto   CKD 3a -baseline creatinine 1.0-1.3 -monitor with diuresis    DVT prophylaxis: lovenox Code Status: full Family Communication: daughter Disposition:   Status is: Inpatient Remains inpatient appropriate because: pending cards recs   Consultants:  cardiology  Procedures:  none  Antimicrobials:  Anti-infectives (From admission, onward)    None       Subjective: No complaints  Objective: Vitals:   09/11/23 2141 09/12/23 0445 09/12/23 1153 09/12/23 1500  BP: (!) 134/91 (!) 139/93  107/77  Pulse:  (!) 104    Resp:  16    Temp:  98.1 F (36.7 C) (!) 97.3 F (36.3 C) 97.8 F (36.6 C)  TempSrc:  Oral Axillary Oral  SpO2:  97%  Weight:  74.1 kg    Height:        Intake/Output Summary (Last 24 hours) at 09/12/2023 1752 Last data filed at 09/12/2023 0256 Gross per 24 hour  Intake 240 ml  Output 2200 ml  Net -1960 ml   Filed Weights   09/10/23 2202 09/11/23 0412 09/12/23 0445  Weight: 79 kg 79 kg 74.1 kg     Examination:  General: No acute distress. Cardiovascular: RRR Lungs: unlabored Neurological: Alert and oriented 3. Moves all extremities 4 with equal strength. Cranial nerves II through XII grossly intact. Extremities: No clubbing or cyanosis. No edema.    Data Reviewed: I have personally reviewed following labs and imaging studies  CBC: Recent Labs  Lab 09/09/23 1214 09/12/23 0646  WBC 6.3 6.1  NEUTROABS  --  4.0  HGB 11.6* 12.1  HCT 35.8* 36.8  MCV 91.6 90.2  PLT 451* 418*    Basic Metabolic Panel: Recent Labs  Lab 09/09/23 1214 09/10/23 0412 09/11/23 0402 09/12/23 0646  NA 136 136 140 136  K 4.2 3.5 3.4* 4.0  CL 101 101 98 97*  CO2 23 25 27 27   GLUCOSE 118* 101* 103* 100*  BUN 22 19 16 13   CREATININE 1.24* 1.10* 1.08* 0.93  CALCIUM 8.6* 8.1* 9.0 8.0*  MG  --   --  1.7 1.9  PHOS  --   --   --  3.5    GFR: Estimated Creatinine Clearance: 65.4 mL/min (by C-G formula based on SCr of 0.93 mg/dL).  Liver Function Tests: Recent Labs  Lab 09/12/23 0646  AST 20  ALT 15  ALKPHOS 75  BILITOT 0.7  PROT 7.1  ALBUMIN 2.9*    CBG: Recent Labs  Lab 09/11/23 2123 09/12/23 0845 09/12/23 1152 09/12/23 1245 09/12/23 1615  GLUCAP 109* 116* 117* 111* 125*     No results found for this or any previous visit (from the past 240 hour(s)).       Radiology Studies: No results found.      Scheduled Meds:  amiodarone  200 mg Oral Daily   atorvastatin  80 mg Oral Daily   enoxaparin (LOVENOX) injection  40 mg Subcutaneous Q24H   furosemide  40 mg Intravenous Q12H   hydrALAZINE  25 mg Oral TID   insulin aspart  0-5 Units Subcutaneous QHS   insulin aspart  0-9 Units Subcutaneous TID WC   isosorbide mononitrate  30 mg Oral Daily   sacubitril-valsartan  1 tablet Oral BID   Continuous Infusions:   LOS: 2 days    Time spent: over 30 min    Lacretia Nicks, MD [facility]   To contact the attending provider between 7A-7P or the  covering provider during after hours 7P-7A, please log into the web site [email or web address] and access using universal [place] password for that web site. If you do not have the password, please call the hospital operator.  09/12/2023, 5:52 PM

## 2023-09-12 NOTE — Plan of Care (Signed)
  Problem: Fluid Volume: Goal: Hemodynamic stability will improve Outcome: Progressing   Problem: Clinical Measurements: Goal: Diagnostic test results will improve Outcome: Progressing Goal: Signs and symptoms of infection will decrease Outcome: Progressing   Problem: Respiratory: Goal: Ability to maintain adequate ventilation will improve Outcome: Progressing   Problem: Education: Goal: Knowledge of General Education information will improve Description: Including pain rating scale, medication(s)/side effects and non-pharmacologic comfort measures Outcome: Progressing   Problem: Health Behavior/Discharge Planning: Goal: Ability to manage health-related needs will improve Outcome: Progressing   Problem: Clinical Measurements: Goal: Ability to maintain clinical measurements within normal limits will improve Outcome: Progressing Goal: Will remain free from infection Outcome: Progressing Goal: Diagnostic test results will improve Outcome: Progressing Goal: Respiratory complications will improve Outcome: Progressing Goal: Cardiovascular complication will be avoided Outcome: Progressing   Problem: Activity: Goal: Risk for activity intolerance will decrease Outcome: Progressing   Problem: Nutrition: Goal: Adequate nutrition will be maintained Outcome: Progressing   Problem: Coping: Goal: Level of anxiety will decrease Outcome: Progressing   Problem: Elimination: Goal: Will not experience complications related to bowel motility Outcome: Progressing Goal: Will not experience complications related to urinary retention Outcome: Progressing   Problem: Pain Managment: Goal: General experience of comfort will improve Outcome: Progressing   Problem: Safety: Goal: Ability to remain free from injury will improve Outcome: Progressing   Problem: Skin Integrity: Goal: Risk for impaired skin integrity will decrease Outcome: Progressing   Problem: Education: Goal: Ability  to demonstrate management of disease process will improve Outcome: Progressing Goal: Ability to verbalize understanding of medication therapies will improve Outcome: Progressing Goal: Individualized Educational Video(s) Outcome: Progressing   Problem: Activity: Goal: Capacity to carry out activities will improve Outcome: Progressing   Problem: Cardiac: Goal: Ability to achieve and maintain adequate cardiopulmonary perfusion will improve Outcome: Progressing   Problem: Education: Goal: Ability to describe self-care measures that may prevent or decrease complications (Diabetes Survival Skills Education) will improve Outcome: Progressing Goal: Individualized Educational Video(s) Outcome: Progressing   Problem: Coping: Goal: Ability to adjust to condition or change in health will improve Outcome: Progressing   Problem: Fluid Volume: Goal: Ability to maintain a balanced intake and output will improve Outcome: Progressing   Problem: Health Behavior/Discharge Planning: Goal: Ability to identify and utilize available resources and services will improve Outcome: Progressing Goal: Ability to manage health-related needs will improve Outcome: Progressing   Problem: Metabolic: Goal: Ability to maintain appropriate glucose levels will improve Outcome: Progressing   Problem: Nutritional: Goal: Maintenance of adequate nutrition will improve Outcome: Progressing Goal: Progress toward achieving an optimal weight will improve Outcome: Progressing   Problem: Skin Integrity: Goal: Risk for impaired skin integrity will decrease Outcome: Progressing   Problem: Tissue Perfusion: Goal: Adequacy of tissue perfusion will improve Outcome: Progressing   Problem: Education: Goal: Understanding of post-operative needs will improve Outcome: Progressing Goal: Individualized Educational Video(s) Outcome: Progressing   Problem: Clinical Measurements: Goal: Postoperative complications will be  avoided or minimized Outcome: Progressing   Problem: Respiratory: Goal: Will regain and/or maintain adequate ventilation Outcome: Progressing

## 2023-09-12 NOTE — Progress Notes (Addendum)
Rounding Note    Patient Name: Michelle Morrison Date of Encounter: 09/12/2023  [facility] Cardiologist: Dina Rich, Morrison   Subjective   Feels well  Inpatient Medications    Scheduled Meds:  amiodarone  200 mg Oral Daily   atorvastatin  80 mg Oral Daily   enoxaparin (LOVENOX) injection  40 mg Subcutaneous Q24H   furosemide  40 mg Intravenous Q12H   hydrALAZINE  25 mg Oral TID   insulin aspart  0-5 Units Subcutaneous QHS   insulin aspart  0-9 Units Subcutaneous TID WC   isosorbide mononitrate  30 mg Oral Daily   sacubitril-valsartan  1 tablet Oral BID   Continuous Infusions:  PRN Meds: acetaminophen **OR** acetaminophen, albuterol, hydrALAZINE, ondansetron **OR** ondansetron (ZOFRAN) IV, polyethylene glycol   Vital Signs    Vitals:   09/11/23 1610 09/11/23 1959 09/11/23 2141 09/12/23 0445  BP: 126/70 134/70 (!) 134/91 (!) 139/93  Pulse: 69 70  (!) 104  Resp: 18 18  16   Temp: 98.3 F (36.8 C) 98.1 F (36.7 C)  98.1 F (36.7 C)  TempSrc: Oral Oral  Oral  SpO2: 97%   97%  Weight:    74.1 kg  Height:        Intake/Output Summary (Last 24 hours) at 09/12/2023 1004 Last data filed at 09/12/2023 0256 Gross per 24 hour  Intake 290 ml  Output 2200 ml  Net -1910 ml      09/12/2023    4:45 AM 09/11/2023    4:12 AM 09/10/2023   10:02 PM  Last 3 Weights  Weight (lbs) 163 lb 4.8 oz 174 lb 1.6 oz 174 lb 1.6 oz  Weight (kg) 74.072 kg 78.971 kg 78.971 kg      Telemetry    SR 70's, ATach low 100s - Personally Reviewed  ECG    No new EKGs - Personally Reviewed  Physical Exam   GEN: No acute distress.   Neck: No JVD Cardiac: RRR, no murmurs, rubs, or gallops.  Respiratory: Clear to auscultation bilaterally. GI: Soft, nontender, non-distended  MS: No edema; No deformity. Neuro:  Nonfocal  Psych: Normal affect   Labs    High Sensitivity Troponin:   Recent Labs  Lab 08/26/23 1337 08/26/23 2046 09/09/23 1750 09/09/23 1859   TROPONINIHS 38* 29* 25* 31*     Chemistry Recent Labs  Lab 09/10/23 0412 09/11/23 0402 09/12/23 0646  NA 136 140 136  K 3.5 3.4* 4.0  CL 101 98 97*  CO2 25 27 27   GLUCOSE 101* 103* 100*  BUN 19 16 13   CREATININE 1.10* 1.08* 0.93  CALCIUM 8.1* 9.0 8.0*  MG  --  1.7 1.9  PROT  --   --  7.1  ALBUMIN  --   --  2.9*  AST  --   --  20  ALT  --   --  15  ALKPHOS  --   --  75  BILITOT  --   --  0.7  GFRNONAA 57* 58* >60  ANIONGAP 10 15 12     Lipids No results for input(s): "CHOL", "TRIG", "HDL", "LABVLDL", "LDLCALC", "CHOLHDL" in the last 168 hours.  Hematology Recent Labs  Lab 09/09/23 1214 09/12/23 0646  WBC 6.3 6.1  RBC 3.91 4.08  HGB 11.6* 12.1  HCT 35.8* 36.8  MCV 91.6 90.2  MCH 29.7 29.7  MCHC 32.4 32.9  RDW 15.8* 16.0*  PLT 451* 418*   Thyroid  Recent Labs  Lab 09/09/23 1750  TSH 3.479    BNP Recent Labs  Lab 09/09/23 1750  BNP 3,202.0*    DDimer No results for input(s): "DDIMER" in the last 168 hours.   Radiology    No results found.  Cardiac Studies   08/27/23: TTE 1. Left ventricular ejection fraction, by estimation, is 15-20%. The left  ventricle has severely decreased function. The left ventricle demonstrates  global hypokinesis. There is mild concentric left ventricular hypertrophy.  Left ventricular diastolic  parameters are consistent with Grade III diastolic dysfunction  (restrictive).   2. Definity contrast shows loosely organized thrombotic material with  sluggish flow at LV apex, no obvious formed thrombus.   3. Right ventricular systolic function is moderately reduced. The right  ventricular size is normal. There is moderately elevated pulmonary artery  systolic pressure. The estimated right ventricular systolic pressure is  48.9 mmHg.   4. Left atrial size was moderately dilated.   5. Right atrial size was moderately dilated.   6. The mitral valve is degenerative. Trivial mitral valve regurgitation.   7. The aortic valve is  tricuspid. Aortic valve regurgitation is not  visualized. Aortic valve sclerosis is present, with no evidence of aortic  valve stenosis.   Comparison(s): Prior images reviewed side by side. LVEF has decreased  significantly since last study.     Patient Profile     62 y.o. female w/PMHx of  HTN, SVT, NICM, LBBB, NICM admitted with progressive DOE found in/with intermittent SVT and volume OL, transferred to [facility] for further evaluation and management.  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 Elberta Fortis has seen the patient She is planned for EPS/ablation on 01/28/23 Continue amiodarone 200mg  daily to STOP on 10/23/23 EP office team Michelle Morrison place pre-procedure and day of procedure instructions in her my chart   Further as per IM and attending cardiology teams EP Michelle Morrison remain available, please recall if needed    For questions or updates, please contact [facility] Please consult [email or web address] for contact info under        Signed, Michelle Pigeon, PA-C  09/12/2023, 10:04 AM    I have seen and examined this patient with Michelle Morrison.  Agree with above, note added to reflect my findings.  Patient feeling well.  Continues to have episodes of tachycardia, though no episodes noted in the 160s to 170 range.  GEN: Well nourished, well developed, in no acute distress  HEENT: normal  Neck: no JVD, carotid bruits, or masses Cardiac: Tachycardic, regular   Respiratory:  clear to auscultation bilaterally, normal work of breathing GI: soft, nontender, nondistended, + BS MS: no deformity or atrophy  Skin: warm and dry Neuro:  Strength and sensation are intact Psych: euthymic mood, full affect   SVT: Currently on amiodarone.  Has plans for ablation 09/30/2023.  Michelle Morrison continue amiodarone for now.  Michelle Morrison hold amiodarone 1 week prior to ablation.  Michelle Morrison M.  Michelle Morrison 09/12/2023 3:10 PM

## 2023-09-12 NOTE — Progress Notes (Signed)
Mobility Specialist Progress Note:   09/12/23 1000  Mobility  Activity Ambulated independently in hallway  Level of Assistance Standby assist, set-up cues, supervision of patient - no hands on  Assistive Device None  Distance Ambulated (ft) 420 ft  Activity Response Tolerated well  Mobility Referral Yes  $Mobility charge 1 Mobility  Mobility Specialist Start Time (ACUTE ONLY) 1034  Mobility Specialist Stop Time (ACUTE ONLY) 1048  Mobility Specialist Time Calculation (min) (ACUTE ONLY) 14 min    Pre Mobility: 64 HR During Mobility: 116 HR Post Mobility:  109 HR  Pt received in chair, eager to mobility. Asymptomatic throughout w/ no complaints. Pt left in chair with call bell and all needs met.  [place] Earlene Plater Mobility Specialist Please contact via Special educational needs teacher or Rehab office at [phone]

## 2023-09-12 NOTE — Progress Notes (Signed)
Progress Note  Patient Name: Michelle Morrison Date of Encounter: 09/12/2023  Primary Cardiologist:   Dina Rich, MD   Subjective   She says that she is breathing better.  No chest pain.   Inpatient Medications    Scheduled Meds:  amiodarone  200 mg Oral Daily   atorvastatin  80 mg Oral Daily   enoxaparin (LOVENOX) injection  40 mg Subcutaneous Q24H   furosemide  40 mg Intravenous Q12H   hydrALAZINE  25 mg Oral TID   insulin aspart  0-5 Units Subcutaneous QHS   insulin aspart  0-9 Units Subcutaneous TID WC   isosorbide mononitrate  30 mg Oral Daily   sacubitril-valsartan  1 tablet Oral BID   Continuous Infusions:  PRN Meds: acetaminophen **OR** acetaminophen, albuterol, hydrALAZINE, ondansetron **OR** ondansetron (ZOFRAN) IV, polyethylene glycol   Vital Signs    Vitals:   09/11/23 1610 09/11/23 1959 09/11/23 2141 09/12/23 0445  BP: 126/70 134/70 (!) 134/91 (!) 139/93  Pulse: 69 70  (!) 104  Resp: 18 18  16   Temp: 98.3 F (36.8 C) 98.1 F (36.7 C)  98.1 F (36.7 C)  TempSrc: Oral Oral  Oral  SpO2: 97%   97%  Weight:    74.1 kg  Height:        Intake/Output Summary (Last 24 hours) at 09/12/2023 1151 Last data filed at 09/12/2023 0256 Gross per 24 hour  Intake 290 ml  Output 2200 ml  Net -1910 ml   Filed Weights   09/10/23 2202 09/11/23 0412 09/12/23 0445  Weight: 79 kg 79 kg 74.1 kg    Telemetry    NSR, ST, runs of atrial tach - Personally Reviewed  ECG    NA - Personally Reviewed  Physical Exam   GEN: No acute distress.   Neck: No  JVD Cardiac: RRR, no murmurs, rubs, or gallops.  Respiratory: Clear  to auscultation bilaterally. GI: Soft, nontender, non-distended  MS: No  edema; No deformity. Neuro:  Nonfocal  Psych: Normal affect   Labs    Chemistry Recent Labs  Lab 09/10/23 0412 09/11/23 0402 09/12/23 0646  NA 136 140 136  K 3.5 3.4* 4.0  CL 101 98 97*  CO2 25 27 27   GLUCOSE 101* 103* 100*  BUN 19 16 13   CREATININE  1.10* 1.08* 0.93  CALCIUM 8.1* 9.0 8.0*  PROT  --   --  7.1  ALBUMIN  --   --  2.9*  AST  --   --  20  ALT  --   --  15  ALKPHOS  --   --  75  BILITOT  --   --  0.7  GFRNONAA 57* 58* >60  ANIONGAP 10 15 12      Hematology Recent Labs  Lab 09/09/23 1214 09/12/23 0646  WBC 6.3 6.1  RBC 3.91 4.08  HGB 11.6* 12.1  HCT 35.8* 36.8  MCV 91.6 90.2  MCH 29.7 29.7  MCHC 32.4 32.9  RDW 15.8* 16.0*  PLT 451* 418*    Cardiac EnzymesNo results for input(s): "TROPONINI" in the last 168 hours. No results for input(s): "TROPIPOC" in the last 168 hours.   BNP Recent Labs  Lab 09/09/23 1750  BNP 3,202.0*     DDimer No results for input(s): "DDIMER" in the last 168 hours.   Radiology    No results found.  Cardiac Studies   Echo:  08/27/23   1. Left ventricular ejection fraction, by estimation, is 15-20%. The left  ventricle has severely decreased function. The left ventricle demonstrates  global hypokinesis. There is mild concentric left ventricular hypertrophy.  Left ventricular diastolic  parameters are consistent with Grade III diastolic dysfunction  (restrictive).   2. Definity contrast shows loosely organized thrombotic material with  sluggish flow at LV apex, no obvious formed thrombus.   3. Right ventricular systolic function is moderately reduced. The right  ventricular size is normal. There is moderately elevated pulmonary artery  systolic pressure. The estimated right ventricular systolic pressure is  48.9 mmHg.   4. Left atrial size was moderately dilated.   5. Right atrial size was moderately dilated.   6. The mitral valve is degenerative. Trivial mitral valve regurgitation.   7. The aortic valve is tricuspid. Aortic valve regurgitation is not  visualized. Aortic valve sclerosis is present, with no evidence of aortic  valve stenosis.    Patient Profile     62 y.o. female wth NICM/HFrEF, atrial tachycardia, HTN, DM2, HTN, CKD 3a, and prior bradycardia  presented with CHF exacerbation again found in atrial tachycardia despite amiodarone.     Assessment & Plan    Acute on chronic systolic and diastolic heart failure/Nonischemic cardiomyopathy:  EF as above.  GDMT limited by low BP.  Tachy brady so she has not been on beta blocker.  Yesterday I did increase her Entresto.  She was started on hydralazine this admission.  Tolerating IV Lasix.  Net negative appears to be 3.9 liters.   I don't believe the weight recorded today.    OK to give IV Lasix this evening and then change to PO in AM.    Atrial tachycardia:  Plan per EP consult this admission is to continue the current amio and plan EP study on 11/5.    Hypertension:  This is being managed in the context of treating his CHF . Hydralazine added.     CKD stage 3a:  Creat is tolerating increased Entresto.     For questions or updates, please contact [facility] Please consult [email or web address] for contact info under Cardiology/STEMI.   Signed, Rollene Rotunda, MD  09/12/2023, 11:51 AM

## 2023-09-12 NOTE — Plan of Care (Addendum)
°  Problem: Clinical Measurements: °Goal: Ability to maintain clinical measurements within normal limits will improve °Outcome: Progressing °Goal: Will remain free from infection °Outcome: Progressing °Goal: Diagnostic test results will improve °Outcome: Progressing °Goal: Respiratory complications will improve °Outcome: Progressing °  °

## 2023-09-12 NOTE — Plan of Care (Signed)
[facility] Tonsillectomy/Adenoidectomy, Postoperative PEDS care plan entered in error.

## 2023-09-13 ENCOUNTER — Other Ambulatory Visit (HOSPITAL_COMMUNITY): Payer: Self-pay

## 2023-09-13 DIAGNOSIS — I4719 Other supraventricular tachycardia: Secondary | ICD-10-CM | POA: Diagnosis not present

## 2023-09-13 LAB — GLUCOSE, CAPILLARY
Glucose-Capillary: 116 mg/dL — ABNORMAL HIGH (ref 70–99)
Glucose-Capillary: 107 mg/dL — ABNORMAL HIGH (ref 70–99)

## 2023-09-13 MED ORDER — FUROSEMIDE 20 MG PO TABS
20.0000 mg | ORAL_TABLET | Freq: Every day | ORAL | Status: DC
  Administered 2023-09-13: 20 mg via ORAL
  Filled 2023-09-13: qty 1

## 2023-09-13 MED ORDER — AMIODARONE HCL 200 MG PO TABS
200.0000 mg | ORAL_TABLET | Freq: Every day | ORAL | Status: DC
Start: 2023-09-13 — End: 2023-09-29

## 2023-09-13 MED ORDER — FUROSEMIDE 20 MG PO TABS
20.0000 mg | ORAL_TABLET | Freq: Every day | ORAL | 0 refills | Status: DC
  Filled 2023-09-13: qty 30, 30d supply, fill #0

## 2023-09-13 MED ORDER — HYDRALAZINE HCL 25 MG PO TABS
25.0000 mg | ORAL_TABLET | Freq: Three times a day (TID) | ORAL | 1 refills | Status: DC
  Filled 2023-09-13 – 2023-10-08 (×2): qty 90, 30d supply, fill #0

## 2023-09-13 MED ORDER — SACUBITRIL-VALSARTAN 97-103 MG PO TABS
1.0000 | ORAL_TABLET | Freq: Two times a day (BID) | ORAL | 1 refills | Status: DC
  Filled 2023-09-13: qty 60, 30d supply, fill #0

## 2023-09-13 NOTE — Discharge Summary (Signed)
1961/07/01      BSA:          1.909 m Patient Age:    62 years       BP:           133/69 mmHg Patient Gender: F              HR:           74 bpm. Exam Location:  [facility] Procedure: 2D Echo, Cardiac Doppler, Color Doppler and Intracardiac            Opacification Agent Indications:    Atrial Flutter I48.92  History:        Patient has prior history of Echocardiogram examinations.                 Arrythmias:Atrial Fibrillation; Risk Factors:Hypertension,                 Diabetes and Dyslipidemia.  Sonographer:    Celesta Gentile RCS Referring Phys: [phone] [name] IMPRESSIONS  1. Left ventricular ejection fraction, by estimation, is 15-20%. The left ventricle has severely decreased function. The left ventricle demonstrates global hypokinesis. There is mild concentric left ventricular hypertrophy. Left  ventricular diastolic parameters are consistent with Grade III diastolic dysfunction (restrictive).  2. Definity contrast shows loosely organized thrombotic material with sluggish flow at LV apex, no obvious formed thrombus.  3. Right ventricular systolic function is moderately reduced. The right ventricular size is normal. There is moderately elevated pulmonary artery systolic pressure. The estimated right ventricular systolic pressure is 48.9 mmHg.  4. Left atrial size was moderately dilated.  5. Right atrial size was moderately dilated.  6. The mitral valve is degenerative. Trivial mitral valve regurgitation.  7. The aortic valve is tricuspid. Aortic valve regurgitation is not visualized. Aortic valve sclerosis is present, with no evidence of aortic valve stenosis. Comparison(s): Prior images reviewed side by side. LVEF has decreased significantly since last study. FINDINGS  Left Ventricle: Left ventricular ejection fraction, by estimation, is 15-20%. The left ventricle has severely decreased function. The left ventricle demonstrates global hypokinesis. The left ventricular internal cavity size was normal in size. There is mild concentric left ventricular hypertrophy. Left ventricular diastolic parameters are consistent with Grade III diastolic dysfunction (restrictive). Right Ventricle: The right ventricular size is normal. No increase in right ventricular wall thickness. Right ventricular systolic function is moderately reduced. There is moderately elevated pulmonary artery systolic pressure. The tricuspid regurgitant velocity is 2.91 m/s, and with an assumed right atrial pressure of 15 mmHg, the estimated right ventricular systolic pressure is 48.9 mmHg. Left Atrium: Left atrial size was moderately dilated. Right Atrium: Right atrial size was moderately dilated. Pericardium: There is no evidence of pericardial effusion. Mitral Valve: The mitral valve is degenerative in appearance. There is mild thickening of  the mitral valve leaflet(s). Trivial mitral valve regurgitation. Tricuspid Valve: The tricuspid valve is grossly normal. Tricuspid valve regurgitation is mild. Aortic Valve: The aortic valve is tricuspid. There is mild aortic valve annular calcification. Aortic valve regurgitation is not visualized. Aortic valve sclerosis is present, with no evidence of aortic valve stenosis. Pulmonic Valve: The pulmonic valve was grossly normal. Pulmonic valve regurgitation is trivial. Aorta: The aortic root is normal in size and structure. IAS/Shunts: No atrial level shunt detected by color flow Doppler.  LEFT VENTRICLE PLAX 2D LVIDd:         4.45 cm     Diastology LVIDs:  Physician Discharge Summary  Michelle Morrison VOZ:[id number] DOB: 10/22/1961 DOA: 09/09/2023  PCP: [name], MD  Admit date: 09/09/2023 Discharge date: 09/13/2023  Time spent: 40 minutes  Recommendations for Outpatient Follow-up:  Follow outpatient CBC/CMP  Follow with cardiology outpatient  EP follow up for ablation  Needs repeat lytes/creatinine within 1 week or so (entresto dose increased, started on lasix) Stop amio 10/28  Repeat chest imaging outpatient   Discharge Diagnoses:  Principal Problem:   Atrial tachycardia (HCC) Active Problems:   Acute on chronic combined systolic and diastolic CHF (congestive heart failure) (HCC)   Diabetes mellitus type 2 with complications (HCC)   HTN (hypertension), benign   Chronic kidney disease, stage 3a (HCC)   Acute on chronic HFrEF (heart failure with reduced ejection fraction) (HCC)   Discharge Condition: stable  Diet recommendation: heart healthy, diabetic  Filed Weights   09/11/23 0412 09/12/23 0445 09/13/23 0339  Weight: 79 kg 74.1 kg 73.3 kg    History of present illness:   62 year old female with Michelle Morrison history of HFrEF, atrial tachycardia, hypertension, diabetes mellitus type 2, CKD stage III, hyperlipidemia presenting with complaints of  feeling her heart rate "going up and down".  She denies any frank chest pain, reports some dyspnea on exertion.  She denies any fevers, chills, cough, hemoptysis, nausea, vomit, diarrhea, abdominal pain.  The patient does report some weight gain up to 177 pounds.  Notably, the patient had an office weight of 168 pounds in the cardiology office on 08/07/2023.   Admitted for HF exacerbation and atrial tachycardia.  She was admitted to [facility] and transferred to [facility] for EP consult.  EP planning for an ablation 11/5.  She's been diuresed with cardiology assistance.  Stable for discharge 10/19.   See below for additional details.  Hospital Course:  Assessment and Plan:  Atrial tachycardia  (HCC) - appreciate cards recs -> appears she's been scheduled for ablation on 11/5 - amiodarone (continue 200 mg daily with plan to stop 10/28 - clarified with EP, their note says 11/28 and is incorrect)   Acute on chronic HFrEF -  CXR with central pulm vascular congestion, ill defined opacity within R lung base, bilateral effusions  - 08/27/23  echo EF of 15 to 20% with grade 3 DD (restrictive) - lasix 20 mg daily at discharge, needs repeat labs in follow up  - Continue Imdur and Entresto (dose increased) hydralazine added - No beta-blocker secondary to prior bradycardia, no aldactone due to hyperkalemia   Diabetes mellitus type 2 with complications (HCC) -08/26/23--A1c 6.5. -Farxiga, Tradjenta   HTN (hypertension), benign -continue hydralazine and imdur -continue Entresto, dose increased.  Started on lasix.   CKD 3a -baseline creatinine 1.0-1.3 -monitor with diuresis     Procedures: none   Consultations: cardiology  Discharge Exam: Vitals:   09/13/23 0339 09/13/23 0748  BP: 105/62 117/63  Pulse: 69 (!) 56  Resp:  16  Temp: 98.3 F (36.8 C) 98.7 F (37.1 C)  SpO2: 95% 96%   No complaints  General: No acute distress. Cardiovascular: RRR Lungs: unlabored Neurological: Alert and oriented 3. Moves all extremities 4 with equal strength. Cranial nerves II through XII grossly intact. Extremities: No clubbing or cyanosis. No edema.  Discharge Instructions   Discharge Instructions     Call MD for:  difficulty breathing, headache or visual disturbances   Complete by: As directed    Call MD for:  extreme fatigue   Complete by: As directed    Call  Physician Discharge Summary  Michelle Morrison VOZ:[id number] DOB: 10/22/1961 DOA: 09/09/2023  PCP: [name], MD  Admit date: 09/09/2023 Discharge date: 09/13/2023  Time spent: 40 minutes  Recommendations for Outpatient Follow-up:  Follow outpatient CBC/CMP  Follow with cardiology outpatient  EP follow up for ablation  Needs repeat lytes/creatinine within 1 week or so (entresto dose increased, started on lasix) Stop amio 10/28  Repeat chest imaging outpatient   Discharge Diagnoses:  Principal Problem:   Atrial tachycardia (HCC) Active Problems:   Acute on chronic combined systolic and diastolic CHF (congestive heart failure) (HCC)   Diabetes mellitus type 2 with complications (HCC)   HTN (hypertension), benign   Chronic kidney disease, stage 3a (HCC)   Acute on chronic HFrEF (heart failure with reduced ejection fraction) (HCC)   Discharge Condition: stable  Diet recommendation: heart healthy, diabetic  Filed Weights   09/11/23 0412 09/12/23 0445 09/13/23 0339  Weight: 79 kg 74.1 kg 73.3 kg    History of present illness:   62 year old female with Michelle Morrison history of HFrEF, atrial tachycardia, hypertension, diabetes mellitus type 2, CKD stage III, hyperlipidemia presenting with complaints of  feeling her heart rate "going up and down".  She denies any frank chest pain, reports some dyspnea on exertion.  She denies any fevers, chills, cough, hemoptysis, nausea, vomit, diarrhea, abdominal pain.  The patient does report some weight gain up to 177 pounds.  Notably, the patient had an office weight of 168 pounds in the cardiology office on 08/07/2023.   Admitted for HF exacerbation and atrial tachycardia.  She was admitted to [facility] and transferred to [facility] for EP consult.  EP planning for an ablation 11/5.  She's been diuresed with cardiology assistance.  Stable for discharge 10/19.   See below for additional details.  Hospital Course:  Assessment and Plan:  Atrial tachycardia  (HCC) - appreciate cards recs -> appears she's been scheduled for ablation on 11/5 - amiodarone (continue 200 mg daily with plan to stop 10/28 - clarified with EP, their note says 11/28 and is incorrect)   Acute on chronic HFrEF -  CXR with central pulm vascular congestion, ill defined opacity within R lung base, bilateral effusions  - 08/27/23  echo EF of 15 to 20% with grade 3 DD (restrictive) - lasix 20 mg daily at discharge, needs repeat labs in follow up  - Continue Imdur and Entresto (dose increased) hydralazine added - No beta-blocker secondary to prior bradycardia, no aldactone due to hyperkalemia   Diabetes mellitus type 2 with complications (HCC) -08/26/23--A1c 6.5. -Farxiga, Tradjenta   HTN (hypertension), benign -continue hydralazine and imdur -continue Entresto, dose increased.  Started on lasix.   CKD 3a -baseline creatinine 1.0-1.3 -monitor with diuresis     Procedures: none   Consultations: cardiology  Discharge Exam: Vitals:   09/13/23 0339 09/13/23 0748  BP: 105/62 117/63  Pulse: 69 (!) 56  Resp:  16  Temp: 98.3 F (36.8 C) 98.7 F (37.1 C)  SpO2: 95% 96%   No complaints  General: No acute distress. Cardiovascular: RRR Lungs: unlabored Neurological: Alert and oriented 3. Moves all extremities 4 with equal strength. Cranial nerves II through XII grossly intact. Extremities: No clubbing or cyanosis. No edema.  Discharge Instructions   Discharge Instructions     Call MD for:  difficulty breathing, headache or visual disturbances   Complete by: As directed    Call MD for:  extreme fatigue   Complete by: As directed    Call  1961/07/01      BSA:          1.909 m Patient Age:    62 years       BP:           133/69 mmHg Patient Gender: F              HR:           74 bpm. [facility] Procedure: 2D Echo, Cardiac Doppler, Color Doppler and Intracardiac            Opacification Agent Indications:    Atrial Flutter I48.92  History:        Patient has prior history of Echocardiogram examinations.                 Arrythmias:Atrial Fibrillation; Risk Factors:Hypertension,                 Diabetes and Dyslipidemia.  Sonographer:    Celesta Gentile RCS Referring Phys: [phone] [name] IMPRESSIONS  1. Left ventricular ejection fraction, by estimation, is 15-20%. The left ventricle has severely decreased function. The left ventricle demonstrates global hypokinesis. There is mild concentric left ventricular hypertrophy. Left  ventricular diastolic parameters are consistent with Grade III diastolic dysfunction (restrictive).  2. Definity contrast shows loosely organized thrombotic material with sluggish flow at LV apex, no obvious formed thrombus.  3. Right ventricular systolic function is moderately reduced. The right ventricular size is normal. There is moderately elevated pulmonary artery systolic pressure. The estimated right ventricular systolic pressure is 48.9 mmHg.  4. Left atrial size was moderately dilated.  5. Right atrial size was moderately dilated.  6. The mitral valve is degenerative. Trivial mitral valve regurgitation.  7. The aortic valve is tricuspid. Aortic valve regurgitation is not visualized. Aortic valve sclerosis is present, with no evidence of aortic valve stenosis. Comparison(s): Prior images reviewed side by side. LVEF has decreased significantly since last study. FINDINGS  Left Ventricle: Left ventricular ejection fraction, by estimation, is 15-20%. The left ventricle has severely decreased function. The left ventricle demonstrates global hypokinesis. The left ventricular internal cavity size was normal in size. There is mild concentric left ventricular hypertrophy. Left ventricular diastolic parameters are consistent with Grade III diastolic dysfunction (restrictive). Right Ventricle: The right ventricular size is normal. No increase in right ventricular wall thickness. Right ventricular systolic function is moderately reduced. There is moderately elevated pulmonary artery systolic pressure. The tricuspid regurgitant velocity is 2.91 m/s, and with an assumed right atrial pressure of 15 mmHg, the estimated right ventricular systolic pressure is 48.9 mmHg. Left Atrium: Left atrial size was moderately dilated. Right Atrium: Right atrial size was moderately dilated. Pericardium: There is no evidence of pericardial effusion. Mitral Valve: The mitral valve is degenerative in appearance. There is mild thickening of  the mitral valve leaflet(s). Trivial mitral valve regurgitation. Tricuspid Valve: The tricuspid valve is grossly normal. Tricuspid valve regurgitation is mild. Aortic Valve: The aortic valve is tricuspid. There is mild aortic valve annular calcification. Aortic valve regurgitation is not visualized. Aortic valve sclerosis is present, with no evidence of aortic valve stenosis. Pulmonic Valve: The pulmonic valve was grossly normal. Pulmonic valve regurgitation is trivial. Aorta: The aortic root is normal in size and structure. IAS/Shunts: No atrial level shunt detected by color flow Doppler.  LEFT VENTRICLE PLAX 2D LVIDd:         4.45 cm     Diastology LVIDs:  1961/07/01      BSA:          1.909 m Patient Age:    62 years       BP:           133/69 mmHg Patient Gender: F              HR:           74 bpm. [facility] Procedure: 2D Echo, Cardiac Doppler, Color Doppler and Intracardiac            Opacification Agent Indications:    Atrial Flutter I48.92  History:        Patient has prior history of Echocardiogram examinations.                 Arrythmias:Atrial Fibrillation; Risk Factors:Hypertension,                 Diabetes and Dyslipidemia.  Sonographer:    Celesta Gentile RCS Referring Phys: [phone] [name] IMPRESSIONS  1. Left ventricular ejection fraction, by estimation, is 15-20%. The left ventricle has severely decreased function. The left ventricle demonstrates global hypokinesis. There is mild concentric left ventricular hypertrophy. Left  ventricular diastolic parameters are consistent with Grade III diastolic dysfunction (restrictive).  2. Definity contrast shows loosely organized thrombotic material with sluggish flow at LV apex, no obvious formed thrombus.  3. Right ventricular systolic function is moderately reduced. The right ventricular size is normal. There is moderately elevated pulmonary artery systolic pressure. The estimated right ventricular systolic pressure is 48.9 mmHg.  4. Left atrial size was moderately dilated.  5. Right atrial size was moderately dilated.  6. The mitral valve is degenerative. Trivial mitral valve regurgitation.  7. The aortic valve is tricuspid. Aortic valve regurgitation is not visualized. Aortic valve sclerosis is present, with no evidence of aortic valve stenosis. Comparison(s): Prior images reviewed side by side. LVEF has decreased significantly since last study. FINDINGS  Left Ventricle: Left ventricular ejection fraction, by estimation, is 15-20%. The left ventricle has severely decreased function. The left ventricle demonstrates global hypokinesis. The left ventricular internal cavity size was normal in size. There is mild concentric left ventricular hypertrophy. Left ventricular diastolic parameters are consistent with Grade III diastolic dysfunction (restrictive). Right Ventricle: The right ventricular size is normal. No increase in right ventricular wall thickness. Right ventricular systolic function is moderately reduced. There is moderately elevated pulmonary artery systolic pressure. The tricuspid regurgitant velocity is 2.91 m/s, and with an assumed right atrial pressure of 15 mmHg, the estimated right ventricular systolic pressure is 48.9 mmHg. Left Atrium: Left atrial size was moderately dilated. Right Atrium: Right atrial size was moderately dilated. Pericardium: There is no evidence of pericardial effusion. Mitral Valve: The mitral valve is degenerative in appearance. There is mild thickening of  the mitral valve leaflet(s). Trivial mitral valve regurgitation. Tricuspid Valve: The tricuspid valve is grossly normal. Tricuspid valve regurgitation is mild. Aortic Valve: The aortic valve is tricuspid. There is mild aortic valve annular calcification. Aortic valve regurgitation is not visualized. Aortic valve sclerosis is present, with no evidence of aortic valve stenosis. Pulmonic Valve: The pulmonic valve was grossly normal. Pulmonic valve regurgitation is trivial. Aorta: The aortic root is normal in size and structure. IAS/Shunts: No atrial level shunt detected by color flow Doppler.  LEFT VENTRICLE PLAX 2D LVIDd:         4.45 cm     Diastology LVIDs:  1961/07/01      BSA:          1.909 m Patient Age:    62 years       BP:           133/69 mmHg Patient Gender: F              HR:           74 bpm. [facility] Procedure: 2D Echo, Cardiac Doppler, Color Doppler and Intracardiac            Opacification Agent Indications:    Atrial Flutter I48.92  History:        Patient has prior history of Echocardiogram examinations.                 Arrythmias:Atrial Fibrillation; Risk Factors:Hypertension,                 Diabetes and Dyslipidemia.  Sonographer:    Celesta Gentile RCS Referring Phys: [phone] [name] IMPRESSIONS  1. Left ventricular ejection fraction, by estimation, is 15-20%. The left ventricle has severely decreased function. The left ventricle demonstrates global hypokinesis. There is mild concentric left ventricular hypertrophy. Left  ventricular diastolic parameters are consistent with Grade III diastolic dysfunction (restrictive).  2. Definity contrast shows loosely organized thrombotic material with sluggish flow at LV apex, no obvious formed thrombus.  3. Right ventricular systolic function is moderately reduced. The right ventricular size is normal. There is moderately elevated pulmonary artery systolic pressure. The estimated right ventricular systolic pressure is 48.9 mmHg.  4. Left atrial size was moderately dilated.  5. Right atrial size was moderately dilated.  6. The mitral valve is degenerative. Trivial mitral valve regurgitation.  7. The aortic valve is tricuspid. Aortic valve regurgitation is not visualized. Aortic valve sclerosis is present, with no evidence of aortic valve stenosis. Comparison(s): Prior images reviewed side by side. LVEF has decreased significantly since last study. FINDINGS  Left Ventricle: Left ventricular ejection fraction, by estimation, is 15-20%. The left ventricle has severely decreased function. The left ventricle demonstrates global hypokinesis. The left ventricular internal cavity size was normal in size. There is mild concentric left ventricular hypertrophy. Left ventricular diastolic parameters are consistent with Grade III diastolic dysfunction (restrictive). Right Ventricle: The right ventricular size is normal. No increase in right ventricular wall thickness. Right ventricular systolic function is moderately reduced. There is moderately elevated pulmonary artery systolic pressure. The tricuspid regurgitant velocity is 2.91 m/s, and with an assumed right atrial pressure of 15 mmHg, the estimated right ventricular systolic pressure is 48.9 mmHg. Left Atrium: Left atrial size was moderately dilated. Right Atrium: Right atrial size was moderately dilated. Pericardium: There is no evidence of pericardial effusion. Mitral Valve: The mitral valve is degenerative in appearance. There is mild thickening of  the mitral valve leaflet(s). Trivial mitral valve regurgitation. Tricuspid Valve: The tricuspid valve is grossly normal. Tricuspid valve regurgitation is mild. Aortic Valve: The aortic valve is tricuspid. There is mild aortic valve annular calcification. Aortic valve regurgitation is not visualized. Aortic valve sclerosis is present, with no evidence of aortic valve stenosis. Pulmonic Valve: The pulmonic valve was grossly normal. Pulmonic valve regurgitation is trivial. Aorta: The aortic root is normal in size and structure. IAS/Shunts: No atrial level shunt detected by color flow Doppler.  LEFT VENTRICLE PLAX 2D LVIDd:         4.45 cm     Diastology LVIDs:

## 2023-09-13 NOTE — Progress Notes (Signed)
Rounding Note    Patient Name: Michelle Morrison Date of Encounter: 09/13/2023  [facility] Cardiologist: Dina Rich, MD   Subjective   Feels well without complaint  Inpatient Medications    Scheduled Meds:  amiodarone  200 mg Oral Daily   atorvastatin  80 mg Oral Daily   enoxaparin (LOVENOX) injection  40 mg Subcutaneous Q24H   furosemide  40 mg Intravenous Q12H   hydrALAZINE  25 mg Oral TID   insulin aspart  0-5 Units Subcutaneous QHS   insulin aspart  0-9 Units Subcutaneous TID WC   isosorbide mononitrate  30 mg Oral Daily   sacubitril-valsartan  1 tablet Oral BID   Continuous Infusions:  PRN Meds: acetaminophen **OR** acetaminophen, albuterol, hydrALAZINE, ondansetron **OR** ondansetron (ZOFRAN) IV, polyethylene glycol   Vital Signs    Vitals:   09/12/23 1937 09/12/23 2106 09/13/23 0339 09/13/23 0748  BP: 120/73 125/81 105/62 117/63  Pulse: (!) 107  69 (!) 56  Resp:    16  Temp: 98.1 F (36.7 C)  98.3 F (36.8 C) 98.7 F (37.1 C)  TempSrc: Oral  Oral Oral  SpO2: 97%  95% 96%  Weight:   73.3 kg   Height:        Intake/Output Summary (Last 24 hours) at 09/13/2023 0913 Last data filed at 09/13/2023 0345 Gross per 24 hour  Intake 360 ml  Output 1850 ml  Net -1490 ml      09/13/2023    3:39 AM 09/12/2023    4:45 AM 09/11/2023    4:12 AM  Last 3 Weights  Weight (lbs) 161 lb 8 oz 163 lb 4.8 oz 174 lb 1.6 oz  Weight (kg) 73.256 kg 74.072 kg 78.971 kg      Telemetry    Sinus rhythm with atrial tachycardia  ECG    None new  Physical Exam   GEN: Well nourished, well developed, in no acute distress  HEENT: normal  Neck: no JVD, carotid bruits, or masses Cardiac: tachycardic; no murmurs, rubs, or gallops,no edema  Respiratory:  clear to auscultation bilaterally, normal work of breathing GI: soft, nontender, nondistended, + BS MS: no deformity or atrophy  Skin: warm and dry Neuro:  Strength and sensation are intact Psych:  euthymic mood, full affect   Labs    High Sensitivity Troponin:   Recent Labs  Lab 08/26/23 1337 08/26/23 2046 09/09/23 1750 09/09/23 1859  TROPONINIHS 38* 29* 25* 31*     Chemistry Recent Labs  Lab 09/10/23 0412 09/11/23 0402 09/12/23 0646  NA 136 140 136  K 3.5 3.4* 4.0  CL 101 98 97*  CO2 25 27 27   GLUCOSE 101* 103* 100*  BUN 19 16 13   CREATININE 1.10* 1.08* 0.93  CALCIUM 8.1* 9.0 8.0*  MG  --  1.7 1.9  PROT  --   --  7.1  ALBUMIN  --   --  2.9*  AST  --   --  20  ALT  --   --  15  ALKPHOS  --   --  75  BILITOT  --   --  0.7  GFRNONAA 57* 58* >60  ANIONGAP 10 15 12     Lipids No results for input(s): "CHOL", "TRIG", "HDL", "LABVLDL", "LDLCALC", "CHOLHDL" in the last 168 hours.  Hematology Recent Labs  Lab 09/09/23 1214 09/12/23 0646  WBC 6.3 6.1  RBC 3.91 4.08  HGB 11.6* 12.1  HCT 35.8* 36.8  MCV 91.6 90.2  MCH 29.7 29.7  MCHC 32.4 32.9  RDW 15.8* 16.0*  PLT 451* 418*   Thyroid  Recent Labs  Lab 09/09/23 1750  TSH 3.479    BNP Recent Labs  Lab 09/09/23 1750  BNP 3,202.0*    DDimer No results for input(s): "DDIMER" in the last 168 hours.   Radiology    No results found.  Cardiac Studies   08/27/23: TTE 1. Left ventricular ejection fraction, by estimation, is 15-20%. The left  ventricle has severely decreased function. The left ventricle demonstrates  global hypokinesis. There is mild concentric left ventricular hypertrophy.  Left ventricular diastolic  parameters are consistent with Grade III diastolic dysfunction  (restrictive).   2. Definity contrast shows loosely organized thrombotic material with  sluggish flow at LV apex, no obvious formed thrombus.   3. Right ventricular systolic function is moderately reduced. The right  ventricular size is normal. There is moderately elevated pulmonary artery  systolic pressure. The estimated right ventricular systolic pressure is  48.9 mmHg.   4. Left atrial size was moderately dilated.    5. Right atrial size was moderately dilated.   6. The mitral valve is degenerative. Trivial mitral valve regurgitation.   7. The aortic valve is tricuspid. Aortic valve regurgitation is not  visualized. Aortic valve sclerosis is present, with no evidence of aortic  valve stenosis.   Comparison(s): Prior images reviewed side by side. LVEF has decreased  significantly since last study.     Patient Profile     62 y.o. female w/PMHx of  HTN, SVT, NICM, LBBB, NICM admitted with progressive DOE found in/with intermittent SVT and volume OL, transferred to [facility] for further evaluation and management.  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  SVT: Has had a marked reduction in ejection fraction.  She is what appears to be an atrial tachycardia currently, but also has a second tachycardia with a much more rapid rate.  Plans for EP study and ablation 09/30/2023.  Annamay Laymon need to stop amiodarone 10/23/2023.  2.  Acute on chronic systolic and diastolic heart failure: Due to nonischemic cardiomyopathy.  Goal-directed therapy limited by hypotension.  Volume appears stable currently.  Has been switched to p.o. Lasix.  Jaima Janney need follow-up labs in 1 week postdischarge.  3.  Hypertension: Managed in the context of heart failure.  4.  CKD stage IIIa: Tolerating Entresto   Cardiology to sign off.  Donelle Hise arrange for follow-up in cardiology clinic as well as for ablation as above.  Continue current medications.  Again Neziah Braley need to stop amiodarone 10/23/2023.    For questions or updates, please contact [facility] Please consult [email or web address] for contact info under        Signed, Treyveon Mochizuki Jorja Loa, MD  09/13/2023, 9:13 AM

## 2023-09-15 ENCOUNTER — Encounter (INDEPENDENT_AMBULATORY_CARE_PROVIDER_SITE_OTHER): Payer: 59 | Admitting: Ophthalmology

## 2023-09-15 DIAGNOSIS — E113293 Type 2 diabetes mellitus with mild nonproliferative diabetic retinopathy without macular edema, bilateral: Secondary | ICD-10-CM

## 2023-09-15 DIAGNOSIS — I10 Essential (primary) hypertension: Secondary | ICD-10-CM

## 2023-09-15 DIAGNOSIS — Z7984 Long term (current) use of oral hypoglycemic drugs: Secondary | ICD-10-CM

## 2023-09-15 DIAGNOSIS — H35033 Hypertensive retinopathy, bilateral: Secondary | ICD-10-CM

## 2023-09-15 DIAGNOSIS — H25813 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-related cataract, bilateral: Secondary | ICD-10-CM

## 2023-09-15 DIAGNOSIS — H34832 Tributary (branch) retinal vein occlusion, left eye, with macular edema: Secondary | ICD-10-CM

## 2023-09-16 ENCOUNTER — Encounter (INDEPENDENT_AMBULATORY_CARE_PROVIDER_SITE_OTHER): Payer: Self-pay | Admitting: Ophthalmology

## 2023-09-16 ENCOUNTER — Ambulatory Visit (INDEPENDENT_AMBULATORY_CARE_PROVIDER_SITE_OTHER): Payer: 59 | Admitting: Ophthalmology

## 2023-09-16 DIAGNOSIS — Z7984 Long term (current) use of oral hypoglycemic drugs: Secondary | ICD-10-CM

## 2023-09-16 DIAGNOSIS — E113293 Type 2 diabetes mellitus with mild nonproliferative diabetic retinopathy without macular edema, bilateral: Secondary | ICD-10-CM

## 2023-09-16 DIAGNOSIS — H35033 Hypertensive retinopathy, bilateral: Secondary | ICD-10-CM

## 2023-09-16 DIAGNOSIS — H25813 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-related cataract, bilateral: Secondary | ICD-10-CM | POA: Diagnosis not present

## 2023-09-16 DIAGNOSIS — H34832 Tributary (branch) retinal vein occlusion, left eye, with macular edema: Secondary | ICD-10-CM

## 2023-09-16 DIAGNOSIS — I10 Essential (primary) hypertension: Secondary | ICD-10-CM | POA: Diagnosis not present

## 2023-09-16 MED ORDER — AFLIBERCEPT 2MG/0.05ML IZ SOLN FOR KALEIDOSCOPE
2.0000 mg | INTRAVITREAL | Status: AC | PRN
  Administered 2023-09-16: 2 mg via INTRAVITREAL

## 2023-09-16 NOTE — Progress Notes (Signed)
[facility] - Clinic Note  09/16/2023    CHIEF COMPLAINT Patient presents for Retina Follow Up   HISTORY OF PRESENT ILLNESS: Michelle Morrison is a 62 y.o. female who presents to the clinic today for:   HPI     Retina Follow Up   Patient presents with  CRVO/BRVO.  In left eye.  This started 6 weeks ago.  Duration of 6 weeks.  Since onset it is stable.  I, the attending physician,  performed the HPI with the patient and updated documentation appropriately.        Comments   6 week retina follow up BRVO OS pt is reporting no vision change noticed she denies any flashes or floaters  pt last reading 107 yesterday       Last edited by [name], MD on 09/16/2023  8:47 AM.    Pt states she can see "a little bit", she has been having testing at the hospital bc her heart rate is going up and down    Referring physician: [name], MD [address] [place],  [place] [address]  HISTORICAL INFORMATION:   Selected notes from the MEDICAL RECORD NUMBER Referred by Dr. [name]: 05.08.23 Ocular Hx- NPDR OS  Patient saw Dr. [name] Feb 2022.  Vision at that time was OD 20/20, OS 20/25    CURRENT MEDICATIONS: No current outpatient medications on file. (Ophthalmic Drugs)   No current facility-administered medications for this visit. (Ophthalmic Drugs)   Current Outpatient Medications (Other)  Medication Sig   albuterol (VENTOLIN HFA) 108 (90 Base) MCG/ACT inhaler Inhale 1-2 puffs into the lungs every 4 (four) hours as needed for wheezing or shortness of breath.   amiodarone (PACERONE) 200 MG tablet Take 1 tablet (200 mg total) by mouth daily for 9 days. Stop this on 10/28 per cardiology.   atorvastatin (LIPITOR) 80 MG tablet Take 1 tablet (80 mg total) by mouth daily.   dapagliflozin propanediol (FARXIGA) 10 MG TABS tablet TAKE 1 TABLET (10 MG TOTAL) BY MOUTH DAILY.   diclofenac Sodium (VOLTAREN) 1 % GEL Apply 4 g topically daily as needed  (stomach pain).   furosemide (LASIX) 20 MG tablet Take 1 tablet (20 mg total) by mouth daily. Follow with cards for refill and repeat labs   hydrALAZINE (APRESOLINE) 25 MG tablet Take 1 tablet (25 mg total) by mouth 3 (three) times daily. Follow with your PCP and cards to follow up your blood pressure and for refills/to adjust dose as needed   isosorbide mononitrate (IMDUR) 30 MG 24 hr tablet Take 1 tablet (30 mg total) by mouth daily.   sacubitril-valsartan (ENTRESTO) 97-103 MG Take 1 tablet by mouth 2 (two) times daily. Follow with cards for refills   TRADJENTA 5 MG TABS tablet Take 5 mg by mouth daily.   No current facility-administered medications for this visit. (Other)   REVIEW OF SYSTEMS: ROS   Positive for: Endocrine, Cardiovascular, Eyes, Respiratory Negative for: Constitutional, Gastrointestinal, Neurological, Skin, Genitourinary, Musculoskeletal, HENT, Psychiatric, Allergic/Imm, Heme/Lymph Last edited by Etheleen Mayhew, COT on 09/16/2023  8:00 AM.      ALLERGIES No Known Allergies  PAST MEDICAL HISTORY Past Medical History:  Diagnosis Date   Atrial tachycardia (HCC)    CHF (congestive heart failure) (HCC)    a. EF 45% by echo in 03/2020 with normal cors by cath   Diabetes mellitus without complication (HCC)    Diarrhea    Heart disease  Herpes simplex infection    Hyperlipidemia    Hypertension    Past Surgical History:  Procedure Laterality Date   CESAREAN SECTION     CHOLECYSTECTOMY     COLONOSCOPY N/A 08/26/2014   Procedure: COLONOSCOPY;  Surgeon: West Bali, MD;  Location: [facility];  Service: Endoscopy;  Laterality: N/A;  9:30 AM - moved to 8:30 - Ginger to notify pt   LEFT HEART CATH AND CORONARY ANGIOGRAPHY N/A 04/14/2020   Procedure: LEFT HEART CATH AND CORONARY ANGIOGRAPHY;  Surgeon: Swazil[place], [name], MD;  Location: [facility] INVASIVE CV LAB;  Service: Cardiovascular;  Laterality: N/A;   RIGHT/LEFT HEART CATH AND CORONARY ANGIOGRAPHY N/A 02/19/2021    Procedure: RIGHT/LEFT HEART CATH AND CORONARY ANGIOGRAPHY;  Surgeon: Laurey Morale, MD;  Location: [facility] INVASIVE CV LAB;  Service: Cardiovascular;  Laterality: N/A;   FAMILY HISTORY Family History  Problem Relation Age of Onset   Hypertension Mother    Diabetes Mother    Colon cancer Neg Hx    SOCIAL HISTORY Social History   Tobacco Use   Smoking status: Never   Smokeless tobacco: Never  Vaping Use   Vaping status: Never Used  Substance Use Topics   Alcohol use: No    Alcohol/week: 0.0 standard drinks of alcohol   Drug use: No       OPHTHALMIC EXAM:  Base Eye Exam     Visual Acuity (Snellen - Linear)       Right Left   Dist [place] 20/20 -2 20/50   Dist ph [place]  NI         Tonometry (Tonopen, 8:06 AM)       Right Left   Pressure 18 18         Pupils       Pupils Dark Light Shape React APD   Right PERRL 2 1 Round Brisk None   Left PERRL 2 1 Round Brisk None         Visual Fields       Left Right    Full Full         Extraocular Movement       Right Left    Full, Ortho Full, Ortho         Neuro/Psych     Oriented x3: Yes   Mood/Affect: Normal         Dilation     Both eyes: 2.5% Phenylephrine @ 8:06 AM           Slit Lamp and Fundus Exam     Slit Lamp Exam       Right Left   Lids/Lashes Dermatochalasis - upper lid, Meibomian gland dysfunction Dermatochalasis - upper lid, Meibomian gland dysfunction   Conjunctiva/Sclera melanosis melanosis   Cornea trace PEE trace PEE   Anterior Chamber deep, narrow temporal angle deep, narrow temporal angle   Iris Round and dilated, No NVI Round and dilated, No NVI   Lens 2+ Nuclear sclerosis, 2+ Cortical cataract 2+ Nuclear sclerosis, 2+ Cortical cataract   Anterior Vitreous Vitreous syneresis Vitreous syneresis         Fundus Exam       Right Left   Disc Pink and sharp, PPP Pink and sharp, PPP   C/D Ratio 0.6 0.6   Macula Flat, Good foveal reflex, trace cystic changes, no heme  Blunted foveal reflex, central edema -- slightly improved, perifoveal exudates, cluster of IRH superior macula -- improving   Vessels Attenuated, Tortuous Attenuated, old  BRVO with macroaneurysm superior macula, mild tortuosity   Periphery Attached, rare MA Attached, rare MA           IMAGING AND PROCEDURES  Imaging and Procedures for 09/16/2023  OCT, Retina - OU - Both Eyes       Right Eye Quality was good. Central Foveal Thickness: 225. Progression has been stable. Findings include normal foveal contour, no IRF, no SRF, intraretinal hyper-reflective material, vitreomacular adhesion (Focal cystic changes and IRHM IT macula -- persistent).   Left Eye Quality was good. Central Foveal Thickness: 271. Progression has improved. Findings include no SRF, abnormal foveal contour, subretinal hyper-reflective material, intraretinal hyper-reflective material, intraretinal fluid, vitreomacular adhesion (Mild interval improvement in IRF/IRHM superior fovea and macula, central ORA and SRHM).   Notes *Images captured and stored on drive  Diagnosis / Impression:  OD: NFP; Focal cystic changes and IRHM IT macula -- persistent OS: BRVO w/ CME - Mild interval improvement in IRF/IRHM superior fovea and macula, central ORA and SRHM  Clinical management:  See below  Abbreviations: NFP - Normal foveal profile. CME - cystoid macular edema. PED - pigment epithelial detachment. IRF - intraretinal fluid. SRF - subretinal fluid. EZ - ellipsoid zone. ERM - epiretinal membrane. ORA - outer retinal atrophy. ORT - outer retinal tubulation. SRHM - subretinal hyper-reflective material. IRHM - intraretinal hyper-reflective material      Intravitreal Injection, Pharmacologic Agent - OS - Left Eye       Time Out 09/16/2023. 8:20 AM. Confirmed correct patient, procedure, site, and patient consented.   Anesthesia Topical anesthesia was used. Anesthetic medications included Lidocaine 2%, Proparacaine 0.5%.    Procedure Preparation included 5% betadine to ocular surface, eyelid speculum. A (32g) needle was used.   Injection: 2 mg aflibercept 2 MG/0.05ML   Route: Intravitreal, Site: Left Eye   NDC: L6038910, Lot: [id number], Expiration date: 07/25/2024, Waste: 0 mL   Post-op Post injection exam found visual acuity of at least counting fingers. The patient tolerated the procedure well. There were no complications. The patient received written and verbal post procedure care education. Post injection medications were not given.            ASSESSMENT/PLAN:    ICD-10-CM   1. Branch retinal vein occlusion of left eye with macular edema  H34.8320 OCT, Retina - OU - Both Eyes    Intravitreal Injection, Pharmacologic Agent - OS - Left Eye    aflibercept (EYLEA) SOLN 2 mg    2. Essential hypertension  I10     3. Hypertensive retinopathy of both eyes  H35.033     4. Mild nonproliferative diabetic retinopathy of both eyes without macular edema associated with type 2 diabetes mellitus (HCC)  W10.2725     5. Long term (current) use of oral hypoglycemic drugs  Z79.84     6. 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-related cataract of both eyes  H25.813      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, ?macroaneurysm - s/p IVA OS #1 (05.17.23), #2 (06.14.23), #3 (08.02.23), #4 (09.06.23), #5 (10.04.23), #6 (11.06.23) -- IVA resistance ========================================================== - s/p IVE OS #1 (12.08.23), #2 (01.17.24), #3 (03.14.24), #4 (04.25.24), #5 (06.07.24), #6 (07.29.24), #7 (09.09.24) **h/o increased fluid OS at 7 wks -- noted on 07.29.24 visit** - BCVA OS stable at 20/50 - OCT shows OS: Mild interval improvement in IRF/IRHM superior fovea and macula,  central ORA and SRHM at 6 weeks - recommend IVE OS #8 today, 10.22.24 w/ f/u at 6 wks - pt wishes to proceed with  injection   - RBA of procedure discussed, questions answered - IVE informed consent obtained and signed, 12.08.23 (OS) - see procedure note - F/U 6 weeks -- DFE/OCT/possible injection   2,3. Hypertensive retinopathy OU - discussed importance of tight BP control - monitor  4,5. Mild nonproliferative diabetic retinopathy w/o DME OD - exam shows rare MA OU  - FA (05.17.23) shows rare MA OU; no NV OU -- BRVO OS as above - OCT OD: NFP; Focal cystic changes and IRHM in temporal macula -- persistent - BCVA OD 20/20  - will continue to hold off on anti-VEGF therapy OD for now  - monitor  6. Mixed Cataract OU - The symptoms of cataract, surgical options, and treatments and risks were discussed with patient. - discussed diagnosis and progression - monitor  Ophthalmic Meds Ordered this visit:  Meds ordered this encounter  Medications   aflibercept (EYLEA) SOLN 2 mg     Return in about 6 weeks (around 10/28/2023) for f/u BRVO OS, DFE, OCT.  There are no Patient Instructions on file for this visit.   This document serves as a record of services personally performed by Karie Chimera, MD, PhD. It was created on their behalf by Charlette Caffey, COT an ophthalmic technician. The creation of this record is the provider's dictation and/or activities during the visit.    Electronically signed by:  Charlette Caffey, COT  09/17/23 8:32 PM  This document serves as a record of services personally performed by Karie Chimera, MD, PhD. It was created on their behalf by Glee Arvin. Manson Passey, OA an ophthalmic technician. The creation of this record is the provider's dictation and/or activities during the visit.    Electronically signed by: Glee Arvin. Manson Passey, OA 09/17/23 8:32 PM  Karie Chimera, M.D., Ph.D. Diseases & Surgery of the Retina and Vitreous [facility]  I have reviewed the above documentation for accuracy and completeness, and I agree with the above. Karie Chimera, M.D., Ph.D. 09/17/23 8:32 PM   Abbreviations: M myopia (nearsighted); A astigmatism; H hyperopia (farsighted); P presbyopia; Mrx spectacle prescription;  CTL contact lenses; OD right eye; OS left eye; OU both eyes  XT exotropia; ET esotropia; PEK punctate epithelial keratitis; PEE punctate epithelial erosions; DES dry eye syndrome; MGD meibomian gland dysfunction; ATs artificial tears; PFAT's preservative free artificial tears; NSC nuclear sclerotic cataract; PSC posterior subcapsular cataract; ERM epi-retinal membrane; PVD posterior vitreous detachment; RD retinal detachment; DM diabetes mellitus; DR diabetic retinopathy; NPDR non-proliferative diabetic retinopathy; PDR proliferative diabetic retinopathy; CSME clinically significant macular edema; DME diabetic macular edema; dbh dot blot hemorrhages; CWS cotton wool spot; POAG primary open angle glaucoma; C/D cup-to-disc ratio; HVF humphrey visual field; GVF goldmann visual field; OCT optical coherence tomography; IOP intraocular pressure; BRVO Branch retinal vein occlusion; CRVO central retinal vein occlusion; CRAO central retinal artery occlusion; BRAO branch retinal artery occlusion; RT retinal tear; SB scleral buckle; PPV pars plana vitrectomy; VH Vitreous hemorrhage; PRP panretinal laser photocoagulation; IVK intravitreal kenalog; VMT vitreomacular traction; MH Macular hole;  NVD neovascularization of the disc; NVE neovascularization elsewhere; AREDS age related eye disease study; ARMD age related macular degeneration; POAG primary open angle glaucoma; EBMD epithelial/anterior basement membrane dystrophy; ACIOL anterior chamber intraocular lens; IOL intraocular lens; PCIOL posterior chamber intraocular lens; Phaco/IOL phacoemulsification with intraocular lens  placement; PRK photorefractive keratectomy; LASIK laser assisted in situ keratomileusis; HTN hypertension; DM diabetes mellitus; COPD chronic obstructive pulmonary disease

## 2023-09-26 ENCOUNTER — Other Ambulatory Visit (HOSPITAL_COMMUNITY)
Admission: RE | Admit: 2023-09-26 | Discharge: 2023-09-26 | Disposition: A | Discharge status: Home / Self Care | Payer: 59 | Source: Ambulatory Visit | Attending: Internal Medicine | Admitting: Internal Medicine

## 2023-09-26 DIAGNOSIS — I479 Paroxysmal tachycardia, unspecified: Secondary | ICD-10-CM | POA: Diagnosis not present

## 2023-09-26 DIAGNOSIS — I10 Essential (primary) hypertension: Secondary | ICD-10-CM | POA: Diagnosis not present

## 2023-09-26 DIAGNOSIS — Z0001 Encounter for general adult medical examination with abnormal findings: Secondary | ICD-10-CM | POA: Diagnosis not present

## 2023-09-26 DIAGNOSIS — E1121 Type 2 diabetes mellitus with diabetic nephropathy: Secondary | ICD-10-CM | POA: Insufficient documentation

## 2023-09-26 DIAGNOSIS — I169 Hypertensive crisis, unspecified: Secondary | ICD-10-CM | POA: Diagnosis not present

## 2023-09-26 DIAGNOSIS — E1122 Type 2 diabetes mellitus with diabetic chronic kidney disease: Secondary | ICD-10-CM | POA: Diagnosis not present

## 2023-09-26 LAB — CBC WITH DIFFERENTIAL/PLATELET
Abs Immature Granulocytes: 0 10*3/uL (ref 0.00–0.07)
Basophils Absolute: 0.1 10*3/uL (ref 0.0–0.1)
Basophils Relative: 1 %
Eosinophils Absolute: 0.1 10*3/uL (ref 0.0–0.5)
Eosinophils Relative: 2 %
HCT: 43.4 % (ref 36.0–46.0)
Hemoglobin: 13.7 g/dL (ref 12.0–15.0)
Immature Granulocytes: 0 %
Lymphocytes Relative: 26 %
Lymphs Abs: 1.1 10*3/uL (ref 0.7–4.0)
MCH: 28.6 pg (ref 26.0–34.0)
MCHC: 31.6 g/dL (ref 30.0–36.0)
MCV: 90.6 fL (ref 80.0–100.0)
Monocytes Absolute: 0.7 10*3/uL (ref 0.1–1.0)
Monocytes Relative: 16 %
Neutro Abs: 2.4 10*3/uL (ref 1.7–7.7)
Neutrophils Relative %: 55 %
Platelets: 233 10*3/uL (ref 150–400)
RBC: 4.79 MIL/uL (ref 3.87–5.11)
RDW: 15.6 % — ABNORMAL HIGH (ref 11.5–15.5)
WBC: 4.4 10*3/uL (ref 4.0–10.5)
nRBC: 0 % (ref 0.0–0.2)

## 2023-09-26 LAB — COMPREHENSIVE METABOLIC PANEL
ALT: 17 U/L (ref 0–44)
AST: 25 U/L (ref 15–41)
Albumin: 3.8 g/dL (ref 3.5–5.0)
Alkaline Phosphatase: 90 U/L (ref 38–126)
Anion gap: 11 (ref 5–15)
BUN: 16 mg/dL (ref 8–23)
CO2: 26 mmol/L (ref 22–32)
Calcium: 9.3 mg/dL (ref 8.9–10.3)
Chloride: 99 mmol/L (ref 98–111)
Creatinine, Ser: 0.98 mg/dL (ref 0.44–1.00)
GFR, Estimated: >60 mL/min (ref >60)
Glucose, Bld: 106 mg/dL — ABNORMAL HIGH (ref 70–99)
Potassium: 4.2 mmol/L (ref 3.5–5.1)
Sodium: 136 mmol/L (ref 135–145)
Total Bilirubin: 0.7 mg/dL (ref 0.3–1.2)
Total Protein: 8 g/dL (ref 6.5–8.1)

## 2023-09-29 ENCOUNTER — Ambulatory Visit: Payer: 59 | Attending: Physician Assistant | Admitting: Physician Assistant

## 2023-09-29 ENCOUNTER — Encounter: Payer: Self-pay | Admitting: Physician Assistant

## 2023-09-29 VITALS — BP 120/60 | HR 68 | Ht 65.0 in | Wt 163.0 lb

## 2023-09-29 DIAGNOSIS — I4719 Other supraventricular tachycardia: Secondary | ICD-10-CM

## 2023-09-29 DIAGNOSIS — E7849 Other hyperlipidemia: Secondary | ICD-10-CM

## 2023-09-29 DIAGNOSIS — N183 Chronic kidney disease, stage 3 unspecified: Secondary | ICD-10-CM | POA: Diagnosis not present

## 2023-09-29 DIAGNOSIS — I5022 Chronic systolic (congestive) heart failure: Secondary | ICD-10-CM

## 2023-09-29 DIAGNOSIS — I10 Essential (primary) hypertension: Secondary | ICD-10-CM

## 2023-09-29 NOTE — Patient Instructions (Signed)
Medication Instructions:   Your physician recommends that you continue on your current medications as directed. Please refer to the Current Medication list given to you today.   Labwork: None today  Testing/Procedures: None today  Follow-Up: Keep February 2025 appointment already scheduled  Any Other Special Instructions Will Be Listed Below (If Applicable).  If you need a refill on your cardiac medications before your next appointment, please call your pharmacy.

## 2023-09-29 NOTE — Pre-Procedure Instructions (Signed)
Instructed patient on the following items: Arrival time 0515 Nothing to eat or drink after midnight No meds AM of procedure Responsible person to drive you home and stay with you for 24 hr

## 2023-09-30 ENCOUNTER — Encounter (HOSPITAL_COMMUNITY)
Admission: RE | Disposition: A | Discharge status: Home / Self Care | Payer: 59 | Source: Home / Self Care | Attending: Internal Medicine

## 2023-09-30 ENCOUNTER — Other Ambulatory Visit: Payer: Self-pay

## 2023-09-30 ENCOUNTER — Ambulatory Visit (HOSPITAL_COMMUNITY)
Admission: RE | Admit: 2023-09-30 | Discharge: 2023-09-30 | Disposition: A | Discharge status: Home / Self Care | Payer: 59 | Attending: Internal Medicine | Admitting: Internal Medicine

## 2023-09-30 DIAGNOSIS — I509 Heart failure, unspecified: Secondary | ICD-10-CM | POA: Insufficient documentation

## 2023-09-30 DIAGNOSIS — I447 Left bundle-branch block, unspecified: Secondary | ICD-10-CM | POA: Diagnosis not present

## 2023-09-30 DIAGNOSIS — I110 Hypertensive heart disease with heart failure: Secondary | ICD-10-CM | POA: Diagnosis not present

## 2023-09-30 DIAGNOSIS — I428 Other cardiomyopathies: Secondary | ICD-10-CM | POA: Diagnosis not present

## 2023-09-30 DIAGNOSIS — I471 Supraventricular tachycardia, unspecified: Secondary | ICD-10-CM | POA: Insufficient documentation

## 2023-09-30 HISTORY — PX: SVT ABLATION: EP1225

## 2023-09-30 LAB — POCT ACTIVATED CLOTTING TIME
Activated Clotting Time: 251 s
Activated Clotting Time: 273 s
Activated Clotting Time: 233 s
Activated Clotting Time: 147 s

## 2023-09-30 LAB — GLUCOSE, CAPILLARY
Glucose-Capillary: 107 mg/dL — ABNORMAL HIGH (ref 70–99)
Glucose-Capillary: 110 mg/dL — ABNORMAL HIGH (ref 70–99)

## 2023-09-30 SURGERY — SVT ABLATION

## 2023-09-30 MED ORDER — PROTAMINE SULFATE 10 MG/ML IV SOLN
INTRAVENOUS | Status: AC
  Filled 2023-09-30: qty 5

## 2023-09-30 MED ORDER — FENTANYL CITRATE (PF) 100 MCG/2ML IJ SOLN
INTRAMUSCULAR | Status: AC
  Filled 2023-09-30: qty 2

## 2023-09-30 MED ORDER — ISOPROTERENOL HCL 0.2 MG/ML IJ SOLN
INTRAMUSCULAR | Status: AC
  Filled 2023-09-30: qty 5

## 2023-09-30 MED ORDER — SODIUM CHLORIDE 0.9% FLUSH: 3.0000 mL | Freq: Two times a day (BID) | INTRAVENOUS | Status: DC

## 2023-09-30 MED ORDER — ADENOSINE 6 MG/2ML IV SOLN
INTRAVENOUS | Status: AC
  Filled 2023-09-30: qty 2

## 2023-09-30 MED ORDER — BUPIVACAINE HCL (PF) 0.25 % IJ SOLN
INTRAMUSCULAR | Status: DC | PRN
  Administered 2023-09-30: 30 mL
  Administered 2023-09-30: 20 mL

## 2023-09-30 MED ORDER — MIDAZOLAM HCL 5 MG/5ML IJ SOLN
INTRAMUSCULAR | Status: AC
  Filled 2023-09-30: qty 5

## 2023-09-30 MED ORDER — SODIUM CHLORIDE 0.9 % IV SOLN
250.0000 mL | INTRAVENOUS | Status: DC | PRN
Start: 2023-09-30 — End: 2023-10-01

## 2023-09-30 MED ORDER — SODIUM CHLORIDE 0.9 % IV SOLN: INTRAVENOUS | Status: DC

## 2023-09-30 MED ORDER — HEPARIN (PORCINE) IN NACL 1000-0.9 UT/500ML-% IV SOLN
INTRAVENOUS | Status: DC | PRN
  Administered 2023-09-30: 500 mL

## 2023-09-30 MED ORDER — ISOPROTERENOL HCL 0.2 MG/ML IJ SOLN
INTRAVENOUS | Status: DC | PRN
  Administered 2023-09-30: 2 ug/min via INTRAVENOUS

## 2023-09-30 MED ORDER — PROTAMINE SULFATE 10 MG/ML IV SOLN
20.0000 mg | Freq: Once | INTRAVENOUS | Status: AC
  Administered 2023-09-30: 20 mg via INTRAVENOUS

## 2023-09-30 MED ORDER — SODIUM CHLORIDE 0.9% FLUSH: 3.0000 mL | INTRAVENOUS | Status: DC | PRN

## 2023-09-30 MED ORDER — HEPARIN SODIUM (PORCINE) 1000 UNIT/ML IJ SOLN
INTRAMUSCULAR | Status: DC | PRN
  Administered 2023-09-30: 6000 [IU] via INTRAVENOUS
  Administered 2023-09-30: 1000 [IU] via INTRAVENOUS

## 2023-09-30 MED ORDER — ADENOSINE 6 MG/2ML IV SOLN
INTRAVENOUS | Status: DC | PRN
  Administered 2023-09-30: 6 mg via INTRAVENOUS

## 2023-09-30 MED ORDER — FENTANYL CITRATE (PF) 100 MCG/2ML IJ SOLN
INTRAMUSCULAR | Status: DC | PRN
  Administered 2023-09-30 (×6): 12.5 ug via INTRAVENOUS

## 2023-09-30 MED ORDER — BUPIVACAINE HCL (PF) 0.25 % IJ SOLN
INTRAMUSCULAR | Status: AC
  Filled 2023-09-30: qty 30

## 2023-09-30 MED ORDER — SODIUM CHLORIDE 0.9 % IV SOLN
INTRAVENOUS | Status: DC | PRN
  Administered 2023-09-30: 250 mL via INTRAVENOUS
  Administered 2023-09-30: 250 mL

## 2023-09-30 MED ORDER — ACETAMINOPHEN 325 MG PO TABS: 650.0000 mg | ORAL_TABLET | ORAL | Status: DC | PRN

## 2023-09-30 MED ORDER — MIDAZOLAM HCL 5 MG/5ML IJ SOLN
INTRAMUSCULAR | Status: DC | PRN
  Administered 2023-09-30 (×6): 1 mg via INTRAVENOUS

## 2023-09-30 MED ORDER — ONDANSETRON HCL 4 MG/2ML IJ SOLN: 4.0000 mg | Freq: Four times a day (QID) | INTRAMUSCULAR | Status: DC | PRN

## 2023-09-30 MED ORDER — PROTAMINE SULFATE 10 MG/ML IV SOLN: 50.0000 mg | Freq: Once | INTRAVENOUS | Status: DC

## 2023-09-30 SURGICAL SUPPLY — 12 items
BAG SNAP BAND KOVER 36X36 (MISCELLANEOUS) IMPLANT
CATH EZ STEER NAV 4MM D-F CUR (ABLATOR) IMPLANT
CATH JOSEPH QUAD ALLRED 6F REP (CATHETERS) IMPLANT
CATH POLARIS X REPROCESSED (CATHETERS) IMPLANT
PACK EP LATEX FREE (CUSTOM PROCEDURE TRAY) ×1
PACK EP LF (CUSTOM PROCEDURE TRAY) ×2 IMPLANT
PAD DEFIB RADIO PHYSIO CONN (PAD) ×2 IMPLANT
PATCH CARTO3 (PAD) IMPLANT
SHEATH PINNACLE 6F 10CM (SHEATH) IMPLANT
SHEATH PINNACLE 7F 10CM (SHEATH) IMPLANT
SHEATH PINNACLE 8F 10CM (SHEATH) IMPLANT
SHEATH PROBE COVER 6X72 (BAG) IMPLANT

## 2023-09-30 NOTE — Discharge Instructions (Signed)

## 2023-09-30 NOTE — Interval H&P Note (Signed)
History and Physical Interval Note:  09/30/2023 7:39 AM  Michelle Morrison  has presented today for surgery, with the diagnosis of svt.  The various methods of treatment have been discussed with the patient and family. After consideration of risks, benefits and other options for treatment, the patient has consented to  Procedure(s): SVT ABLATION (N/A) as a surgical intervention.  The patient's history has been reviewed, patient examined, no change in status, stable for surgery.  I have reviewed the patient's chart and labs.  Questions were answered to the patient's satisfaction.     Lewayne Bunting

## 2023-09-30 NOTE — Progress Notes (Signed)
Site area: rt groin 26F arterial; 6,7,26F venous sheaths pulled Site Prior to Removal:  Level 0 Pressure Applied For: 40 minutes Manual:   yes Patient Status During Pull:  stable Post Pull Site:  Level 0 Post Pull Instructions Given:  yes Post Pull Pulses Present: rt dp 2+ palpable Dressing Applied:  gauze and tegaderm Bedrest begins @ 1335 Comments:

## 2023-09-30 NOTE — Progress Notes (Signed)
Patient walked to the bathroom without difficulties. Right groin site level 0, clean, dry, and intact.

## 2023-10-01 ENCOUNTER — Encounter (HOSPITAL_COMMUNITY): Payer: Self-pay | Admitting: Internal Medicine

## 2023-10-03 ENCOUNTER — Telehealth: Payer: Self-pay | Admitting: Cardiology

## 2023-10-03 NOTE — Telephone Encounter (Signed)
Pt came in office and stated that she had her procedure today and her job is requesting that she have a letter stating her job restrictions. [phone] is the best number to reach the pt.

## 2023-10-06 NOTE — Telephone Encounter (Signed)
Patient is following up requesting updates.

## 2023-10-07 NOTE — Telephone Encounter (Signed)
No restrictions per GT. Letter printed and given to pt.

## 2023-10-07 NOTE — Telephone Encounter (Signed)
Pt is calling back for an update.  

## 2023-10-08 ENCOUNTER — Other Ambulatory Visit (HOSPITAL_COMMUNITY): Payer: Self-pay

## 2023-10-08 ENCOUNTER — Other Ambulatory Visit (HOSPITAL_COMMUNITY): Payer: Self-pay | Admitting: Cardiology

## 2023-10-08 MED FILL — Furosemide Tab 20 MG: ORAL | 30 days supply | Qty: 30 | Fill #0 | Status: AC

## 2023-10-10 ENCOUNTER — Other Ambulatory Visit (HOSPITAL_COMMUNITY): Payer: Self-pay

## 2023-10-10 ENCOUNTER — Telehealth: Payer: Self-pay | Admitting: Cardiology

## 2023-10-10 NOTE — Telephone Encounter (Signed)
Pt came in office and stated that she is out of refills for her furosemide (LASIX) 20 MG tablet  she would like it sent to [facility] on [address] [phone] is the best number to reach her.

## 2023-10-14 NOTE — Progress Notes (Shared)
[facility] - Clinic Note  10/28/2023    CHIEF COMPLAINT Patient presents for No chief complaint on file.   HISTORY OF PRESENT ILLNESS: Michelle Morrison is a 62 y.o. female who presents to the clinic today for:    Pt states she can see "a little bit", she has been having testing at the hospital bc her heart rate is going up and down    Referring physician: [name], MD [address] [place],  [place] [address]  HISTORICAL INFORMATION:   Selected notes from the MEDICAL RECORD NUMBER Referred by Dr. Felisa Bonier: 05.08.23 Ocular Hx- NPDR OS  Patient saw Dr. [name] Feb 2022.  Vision at that time was OD 20/20, OS 20/25    CURRENT MEDICATIONS: No current outpatient medications on file. (Ophthalmic Drugs)   No current facility-administered medications for this visit. (Ophthalmic Drugs)   Current Outpatient Medications (Other)  Medication Sig   albuterol (VENTOLIN HFA) 108 (90 Base) MCG/ACT inhaler Inhale 1-2 puffs into the lungs every 4 (four) hours as needed for wheezing or shortness of breath.   amiodarone (PACERONE) 200 MG tablet Take 200 mg by mouth daily.   atorvastatin (LIPITOR) 80 MG tablet Take 1 tablet (80 mg total) by mouth daily.   dapagliflozin propanediol (FARXIGA) 10 MG TABS tablet TAKE 1 TABLET (10 MG TOTAL) BY MOUTH DAILY. (Patient not taking: Reported on 09/29/2023)   diclofenac Sodium (VOLTAREN) 1 % GEL Apply 4 g topically daily as needed (stomach pain).   furosemide (LASIX) 20 MG tablet Take 1 tablet (20 mg total) by mouth daily. Follow with cards for refill and repeat labs   hydrALAZINE (APRESOLINE) 25 MG tablet Take 1 tablet (25 mg total) by mouth 3 (three) times daily. Follow up with your PCP and cardiologist to follow up your blood pressure and for refills/to adjust dose as needed   isosorbide mononitrate (IMDUR) 30 MG 24 hr tablet Take 1 tablet (30 mg total) by mouth daily.   sacubitril-valsartan (ENTRESTO) 97-103 MG Take  1 tablet by mouth 2 (two) times daily. Follow with cards for refills   TRADJENTA 5 MG TABS tablet Take 5 mg by mouth daily.   No current facility-administered medications for this visit. (Other)   REVIEW OF SYSTEMS:    ALLERGIES No Known Allergies  PAST MEDICAL HISTORY Past Medical History:  Diagnosis Date   Atrial tachycardia (HCC)    CHF (congestive heart failure) (HCC)    a. EF 45% by echo in 03/2020 with normal cors by cath   Diabetes mellitus without complication (HCC)    Diarrhea    Heart disease    Herpes simplex infection    Hyperlipidemia    Hypertension    Past Surgical History:  Procedure Laterality Date   CESAREAN SECTION     CHOLECYSTECTOMY     COLONOSCOPY N/A 08/26/2014   Procedure: COLONOSCOPY;  Surgeon: West Bali, MD;  Location: [facility];  Service: Endoscopy;  Laterality: N/A;  9:30 AM - moved to 8:30 - Ginger to notify pt   LEFT HEART CATH AND CORONARY ANGIOGRAPHY N/A 04/14/2020   Procedure: LEFT HEART CATH AND CORONARY ANGIOGRAPHY;  Surgeon: Swaziland, [name], MD;  Location: [facility] INVASIVE CV LAB;  Service: Cardiovascular;  Laterality: N/A;   RIGHT/LEFT HEART CATH AND CORONARY ANGIOGRAPHY N/A 02/19/2021   Procedure: RIGHT/LEFT HEART CATH AND CORONARY ANGIOGRAPHY;  Surgeon: Laurey Morale, MD;  Location: [facility] INVASIVE CV LAB;  Service: Cardiovascular;  Laterality: N/A;  SVT ABLATION N/A 09/30/2023   Procedure: SVT ABLATION;  Surgeon: Marinus Maw, MD;  Location: [facility] INVASIVE CV LAB;  Service: Cardiovascular;  Laterality: N/A;   FAMILY HISTORY Family History  Problem Relation Age of Onset   Hypertension Mother    Diabetes Mother    Colon cancer Neg Hx    SOCIAL HISTORY Social History   Tobacco Use   Smoking status: Never   Smokeless tobacco: Never  Vaping Use   Vaping status: Never Used  Substance Use Topics   Alcohol use: No    Alcohol/week: 0.0 standard drinks of alcohol   Drug use: No       OPHTHALMIC EXAM:  Not recorded     IMAGING AND PROCEDURES  Imaging and Procedures for 10/28/2023          ASSESSMENT/PLAN:    ICD-10-CM   1. Branch retinal vein occlusion of left eye with macular edema  H34.8320     2. Essential hypertension  I10     3. Hypertensive retinopathy of both eyes  H35.033     4. Mild nonproliferative diabetic retinopathy of both eyes without macular edema associated with type 2 diabetes mellitus (HCC)  U98.1191     5. Long term (current) use of oral hypoglycemic drugs  Z79.84     6. 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-related cataract of both eyes  H25.813       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, ?macroaneurysm - s/p IVA OS #1 (05.17.23), #2 (06.14.23), #3 (08.02.23), #4 (09.06.23), #5 (10.04.23), #6 (11.06.23) -- IVA resistance ========================================================== - s/p IVE OS #1 (12.08.23), #2 (01.17.24), #3 (03.14.24), #4 (04.25.24), #5 (06.07.24), #6 (07.29.24), #7 (09.09.24), #8 (10.22.24) **h/o increased fluid OS at 7 wks -- noted on 07.29.24 visit** - BCVA OS stable at 20/50 - OCT shows OS: Mild interval improvement in IRF/IRHM superior fovea and macula, central ORA and SRHM at 6 weeks - recommend IVE OS #9 today, 12.03.24 w/ f/u at 6 wks - pt wishes to proceed with injection   - RBA of procedure discussed, questions answered - IVE informed consent obtained and signed, 12.08.23 (OS) - see procedure note - F/U 6 weeks -- DFE/OCT/possible injection   2,3. Hypertensive retinopathy OU - discussed importance of tight BP control - monitor  4,5. Mild nonproliferative diabetic retinopathy w/o DME OD - exam shows rare MA OU  - FA (05.17.23) shows rare MA OU; no NV OU -- BRVO OS as above - OCT OD: NFP; Focal cystic changes and IRHM in temporal macula -- persistent - BCVA OD 20/20  - will continue to hold off on anti-VEGF therapy OD  for now  - monitor  6. Mixed Cataract OU - The symptoms of cataract, surgical options, and treatments and risks were discussed with patient. - discussed diagnosis and progression - monitor  Ophthalmic Meds Ordered this visit:  No orders of the defined types were placed in this encounter.    No follow-ups on file.  There are no Patient Instructions on file for this visit.   This document serves as a record of services personally performed by Karie Chimera, MD, PhD. It was created on their behalf by Charlette Caffey, COT an ophthalmic technician. The creation of this record is the provider's dictation and/or activities during the visit.    Electronically signed by:  Charlette Caffey, COT  10/14/23 12:05 PM    Karie Chimera, M.D., Ph.D. Diseases & Surgery of the Retina and Vitreous [facility]     Abbreviations: M myopia (nearsighted); A astigmatism; H hyperopia (farsighted); P presbyopia; Mrx spectacle prescription;  CTL contact lenses; OD right eye; OS left eye; OU both eyes  XT exotropia; ET esotropia; PEK punctate epithelial keratitis; PEE punctate epithelial erosions; DES dry eye syndrome; MGD meibomian gland dysfunction; ATs artificial tears; PFAT's preservative free artificial tears; NSC nuclear sclerotic cataract; PSC posterior subcapsular cataract; ERM epi-retinal membrane; PVD posterior vitreous detachment; RD retinal detachment; DM diabetes mellitus; DR diabetic retinopathy; NPDR non-proliferative diabetic retinopathy; PDR proliferative diabetic retinopathy; CSME clinically significant macular edema; DME diabetic macular edema; dbh dot blot hemorrhages; CWS cotton wool spot; POAG primary open angle glaucoma; C/D cup-to-disc ratio; HVF humphrey visual field; GVF goldmann visual field; OCT optical coherence tomography; IOP intraocular pressure; BRVO Branch retinal vein occlusion; CRVO central retinal vein occlusion; CRAO central retinal artery  occlusion; BRAO branch retinal artery occlusion; RT retinal tear; SB scleral buckle; PPV pars plana vitrectomy; VH Vitreous hemorrhage; PRP panretinal laser photocoagulation; IVK intravitreal kenalog; VMT vitreomacular traction; MH Macular hole;  NVD neovascularization of the disc; NVE neovascularization elsewhere; AREDS age related eye disease study; ARMD age related macular degeneration; POAG primary open angle glaucoma; EBMD epithelial/anterior basement membrane dystrophy; ACIOL anterior chamber intraocular lens; IOL intraocular lens; PCIOL posterior chamber intraocular lens; Phaco/IOL phacoemulsification with intraocular lens placement; PRK photorefractive keratectomy; LASIK laser assisted in situ keratomileusis; HTN hypertension; DM diabetes mellitus; COPD chronic obstructive pulmonary disease

## 2023-10-15 ENCOUNTER — Telehealth: Payer: Self-pay | Admitting: Cardiology

## 2023-10-15 MED ORDER — SACUBITRIL-VALSARTAN 97-103 MG PO TABS: 1.0000 | ORAL_TABLET | Freq: Two times a day (BID) | ORAL | 3 refills | Status: AC

## 2023-10-15 NOTE — Telephone Encounter (Signed)
Patient came by office. Needs refills for Entresto. It was prescribed in the hospital. Notes say in med list FU with cardiology for refills. She was seen with Michelle Morrison on Nov 4th.

## 2023-10-15 NOTE — Telephone Encounter (Signed)
Refilled as requested  

## 2023-10-20 ENCOUNTER — Institutional Professional Consult (permissible substitution): Payer: 59 | Admitting: Primary Care

## 2023-10-26 DIAGNOSIS — I5042 Chronic combined systolic (congestive) and diastolic (congestive) heart failure: Secondary | ICD-10-CM | POA: Diagnosis not present

## 2023-10-26 DIAGNOSIS — I10 Essential (primary) hypertension: Secondary | ICD-10-CM | POA: Diagnosis not present

## 2023-10-27 ENCOUNTER — Other Ambulatory Visit (HOSPITAL_COMMUNITY): Payer: Self-pay

## 2023-10-28 ENCOUNTER — Encounter (INDEPENDENT_AMBULATORY_CARE_PROVIDER_SITE_OTHER): Payer: 59 | Admitting: Ophthalmology

## 2023-10-28 ENCOUNTER — Encounter (INDEPENDENT_AMBULATORY_CARE_PROVIDER_SITE_OTHER): Payer: Self-pay | Admitting: Ophthalmology

## 2023-10-28 ENCOUNTER — Ambulatory Visit (INDEPENDENT_AMBULATORY_CARE_PROVIDER_SITE_OTHER): Payer: 59 | Admitting: Ophthalmology

## 2023-10-28 DIAGNOSIS — Z7984 Long term (current) use of oral hypoglycemic drugs: Secondary | ICD-10-CM

## 2023-10-28 DIAGNOSIS — E113293 Type 2 diabetes mellitus with mild nonproliferative diabetic retinopathy without macular edema, bilateral: Secondary | ICD-10-CM

## 2023-10-28 DIAGNOSIS — H34832 Tributary (branch) retinal vein occlusion, left eye, with macular edema: Secondary | ICD-10-CM

## 2023-10-28 DIAGNOSIS — H35033 Hypertensive retinopathy, bilateral: Secondary | ICD-10-CM | POA: Diagnosis not present

## 2023-10-28 DIAGNOSIS — I10 Essential (primary) hypertension: Secondary | ICD-10-CM | POA: Diagnosis not present

## 2023-10-28 DIAGNOSIS — H25813 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-related cataract, bilateral: Secondary | ICD-10-CM

## 2023-10-28 MED ORDER — AFLIBERCEPT 2MG/0.05ML IZ SOLN FOR KALEIDOSCOPE
2.0000 mg | INTRAVITREAL | Status: AC | PRN
  Administered 2023-10-28: 2 mg via INTRAVITREAL

## 2023-10-28 NOTE — Progress Notes (Signed)
[facility] - Clinic Note  10/28/2023    CHIEF COMPLAINT Patient presents for Retina Follow Up   HISTORY OF PRESENT ILLNESS: Michelle Morrison is a 62 y.o. female who presents to the clinic today for:   HPI     Retina Follow Up   Patient presents with  CRVO/BRVO.  In left eye.  This started 6 weeks ago.  Duration of 6 weeks.  Since onset it is stable.  I, the attending physician,  performed the HPI with the patient and updated documentation appropriately.        Comments   Patient feels the vision is a little better. She is not using eye drops. Her blood sugar was 107.      Last edited by [name], MD on 10/28/2023 12:52 PM.     Pt states    Referring physician: [name], MD [address] [place],  [place] [address]  HISTORICAL INFORMATION:   Selected notes from the MEDICAL RECORD NUMBER Referred by Dr. [name]: 05.08.23 Ocular Hx- NPDR OS  Patient saw Dr. [name] Feb 2022.  Vision at that time was OD 20/20, OS 20/25    CURRENT MEDICATIONS: No current outpatient medications on file. (Ophthalmic Drugs)   No current facility-administered medications for this visit. (Ophthalmic Drugs)   Current Outpatient Medications (Other)  Medication Sig   albuterol (VENTOLIN HFA) 108 (90 Base) MCG/ACT inhaler Inhale 1-2 puffs into the lungs every 4 (four) hours as needed for wheezing or shortness of breath.   amiodarone (PACERONE) 200 MG tablet Take 200 mg by mouth daily.   atorvastatin (LIPITOR) 80 MG tablet Take 1 tablet (80 mg total) by mouth daily.   dapagliflozin propanediol (FARXIGA) 10 MG TABS tablet TAKE 1 TABLET (10 MG TOTAL) BY MOUTH DAILY.   diclofenac Sodium (VOLTAREN) 1 % GEL Apply 4 g topically daily as needed (stomach pain).   furosemide (LASIX) 20 MG tablet Take 1 tablet (20 mg total) by mouth daily. Follow with cards for refill and repeat labs   hydrALAZINE (APRESOLINE) 25 MG tablet Take 1 tablet (25 mg total) by mouth  3 (three) times daily. Follow up with your PCP and cardiologist to follow up your blood pressure and for refills/to adjust dose as needed   isosorbide mononitrate (IMDUR) 30 MG 24 hr tablet Take 1 tablet (30 mg total) by mouth daily.   sacubitril-valsartan (ENTRESTO) 97-103 MG Take 1 tablet by mouth 2 (two) times daily. Follow with cards for refills   TRADJENTA 5 MG TABS tablet Take 5 mg by mouth daily.   No current facility-administered medications for this visit. (Other)   REVIEW OF SYSTEMS: ROS   Positive for: Endocrine, Cardiovascular, Eyes, Respiratory Negative for: Constitutional, Gastrointestinal, Neurological, Skin, Genitourinary, Musculoskeletal, HENT, Psychiatric, Allergic/Imm, Heme/Lymph Last edited by Charlette Caffey, COT on 10/28/2023  9:03 AM.       ALLERGIES No Known Allergies  PAST MEDICAL HISTORY Past Medical History:  Diagnosis Date   Atrial tachycardia (HCC)    CHF (congestive heart failure) (HCC)    a. EF 45% by echo in 03/2020 with normal cors by cath   Diabetes mellitus without complication (HCC)    Diarrhea    Heart disease    Herpes simplex infection    Hyperlipidemia    Hypertension    Past Surgical History:  Procedure Laterality Date   CESAREAN SECTION     CHOLECYSTECTOMY     COLONOSCOPY N/A 08/26/2014   Procedure: COLONOSCOPY;  Surgeon: West Bali, MD;  Location: [facility];  Service: Endoscopy;  Laterality: N/A;  9:30 AM - moved to 8:30 - Ginger to notify pt   LEFT HEART CATH AND CORONARY ANGIOGRAPHY N/A 04/14/2020   Procedure: LEFT HEART CATH AND CORONARY ANGIOGRAPHY;  Surgeon: Swaziland, [name], MD;  Location: [facility] INVASIVE CV LAB;  Service: Cardiovascular;  Laterality: N/A;   RIGHT/LEFT HEART CATH AND CORONARY ANGIOGRAPHY N/A 02/19/2021   Procedure: RIGHT/LEFT HEART CATH AND CORONARY ANGIOGRAPHY;  Surgeon: Laurey Morale, MD;  Location: [facility] INVASIVE CV LAB;  Service: Cardiovascular;  Laterality: N/A;   SVT ABLATION N/A 09/30/2023    Procedure: SVT ABLATION;  Surgeon: Marinus Maw, MD;  Location: [facility] INVASIVE CV LAB;  Service: Cardiovascular;  Laterality: N/A;   FAMILY HISTORY Family History  Problem Relation Age of Onset   Hypertension Mother    Diabetes Mother    Colon cancer Neg Hx    SOCIAL HISTORY Social History   Tobacco Use   Smoking status: Never   Smokeless tobacco: Never  Vaping Use   Vaping status: Never Used  Substance Use Topics   Alcohol use: No    Alcohol/week: 0.0 standard drinks of alcohol   Drug use: No       OPHTHALMIC EXAM:  Base Eye Exam     Visual Acuity (Snellen - Linear)       Right Left   Dist [place] 20/20 20/50   Dist ph [place]  NI         Tonometry (Tonopen, 9:07 AM)       Right Left   Pressure 14 18         Pupils       Dark Light Shape React APD   Right 2 1 Round Slow None   Left 2 1 Round Slow None         Visual Fields       Left Right    Full Full         Extraocular Movement       Right Left    Full, Ortho Full, Ortho         Neuro/Psych     Oriented x3: Yes   Mood/Affect: Normal         Dilation     Both eyes: 1.0% Mydriacyl, 2.5% Phenylephrine @ 9:04 AM           Slit Lamp and Fundus Exam     Slit Lamp Exam       Right Left   Lids/Lashes Dermatochalasis - upper lid, Meibomian gland dysfunction Dermatochalasis - upper lid, Meibomian gland dysfunction   Conjunctiva/Sclera melanosis melanosis   Cornea trace PEE trace PEE   Anterior Chamber deep, narrow temporal angle deep, narrow temporal angle   Iris Round and dilated, No NVI Round and dilated, No NVI   Lens 2+ Nuclear sclerosis, 2+ Cortical cataract 2+ Nuclear sclerosis, 2+ Cortical cataract   Anterior Vitreous Vitreous syneresis Vitreous syneresis         Fundus Exam       Right Left   Disc Pink and sharp, PPP Pink and sharp, PPP   C/D Ratio 0.6 0.6   Macula Flat, Good foveal reflex, trace cystic changes, focal punctate MA temporal to fovea, no frank edema  Blunted foveal reflex, central edema -- slightly improved, perifoveal exudates, cluster of IRH superior macula -- improving   Vessels Attenuated, Tortuous Attenuated, 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 Central Foveal Thickness: 228. Progression has been stable. Findings include normal foveal contour, no IRF, no SRF, intraretinal hyper-reflective material, vitreomacular adhesion (Focal cystic changes and IRHM IT macula -- slightly improved).   Left Eye Quality was good. Central Foveal Thickness: 258. Progression has improved. Findings include no SRF, abnormal foveal contour, subretinal hyper-reflective material, intraretinal hyper-reflective material, intraretinal fluid, vitreomacular adhesion (Mild interval improvement in IRF/IRHM superior fovea and macula, central ORA and SRHM).   Notes *Images captured and stored on drive  Diagnosis / Impression:  OD: NFP; Focal cystic changes and IRHM IT macula -- slightly improved OS: BRVO w/ CME - Mild interval improvement in IRF/IRHM superior fovea and macula, central ORA and SRHM  Clinical management:  See below  Abbreviations: NFP - Normal foveal profile. CME - cystoid macular edema. PED - pigment epithelial detachment. IRF - intraretinal fluid. SRF - subretinal fluid. EZ - ellipsoid zone. ERM - epiretinal membrane. ORA - outer retinal atrophy. ORT - outer retinal tubulation. SRHM - subretinal hyper-reflective material. IRHM - intraretinal hyper-reflective material      Intravitreal Injection, Pharmacologic Agent - OS - Left Eye       Time Out 10/28/2023. 9:54 AM. Confirmed correct patient, procedure, site, and patient consented.   Anesthesia Topical anesthesia was used. Anesthetic medications included Lidocaine 2%, Proparacaine  0.5%.   Procedure Preparation included 5% betadine to ocular surface, eyelid speculum. A (32g) needle was used.   Injection: 2 mg aflibercept 2 MG/0.05ML   Route: Intravitreal, Site: Left Eye   NDC: L6038910, Lot: [id number], Expiration date: 03/23/2025, Waste: 0 mL   Post-op Post injection exam found visual acuity of at least counting fingers. The patient tolerated the procedure well. There were no complications. The patient received written and verbal post procedure care education. Post injection medications were not given.            ASSESSMENT/PLAN:    ICD-10-CM   1. Branch retinal vein occlusion of left eye with macular edema  H34.8320 OCT, Retina - OU - Both Eyes    Intravitreal Injection, Pharmacologic Agent - OS - Left Eye    aflibercept (EYLEA) SOLN 2 mg    2. Essential hypertension  I10     3. Hypertensive retinopathy of both eyes  H35.033     4. Mild nonproliferative diabetic retinopathy of both eyes without macular edema associated with type 2 diabetes mellitus (HCC)  V78.4696     5. Long term (current) use of oral hypoglycemic drugs  Z79.84     6. 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-related cataract of both eyes  H25.813       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, ?macroaneurysm - s/p IVA OS #1 (05.17.23), #2 (06.14.23), #3 (08.02.23), #4 (09.06.23), #5 (10.04.23), #6 (11.06.23) -- IVA resistance ========================================================== - s/p IVE OS #1 (12.08.23), #2 (01.17.24), #3 (03.14.24), #4 (04.25.24), #5 (06.07.24), #6 (07.29.24), #7 (09.09.24), #8 (10.22.24) **h/o increased fluid OS at 7 wks -- noted on 07.29.24 visit** - BCVA OS stable at 20/50 - OCT shows OS: Mild interval improvement in IRF/IRHM superior fovea and macula, central ORA and SRHM  at 6 weeks - recommend IVE OS #9 today, 12.03.24 w/ f/u at 6 wks - pt  wishes to proceed with injection   - RBA of procedure discussed, questions answered - IVE informed consent obtained and signed, 12.08.23 (OS) - see procedure note - F/U 6 weeks -- DFE/OCT/possible injection   2,3. Hypertensive retinopathy OU - discussed importance of tight BP control - monitor  4,5. Mild nonproliferative diabetic retinopathy w/o DME OD - exam shows rare MA OU  - FA (05.17.23) shows rare MA OU; no NV OU -- BRVO OS as above - OCT OD: NFP; Focal cystic changes and IRHM in temporal macula -- persistent - BCVA OD 20/20  - will continue to hold off on anti-VEGF therapy OD for now  - monitor  6. Mixed Cataract OU - The symptoms of cataract, surgical options, and treatments and risks were discussed with patient. - discussed diagnosis and progression - monitor  Ophthalmic Meds Ordered this visit:  Meds ordered this encounter  Medications   aflibercept (EYLEA) SOLN 2 mg     Return in about 6 weeks (around 12/09/2023) for f/u BRVO OS, DFE, OCT.  There are no Patient Instructions on file for this visit.   This document serves as a record of services personally performed by Karie Chimera, MD, PhD. It was created on their behalf by Charlette Caffey, COT an ophthalmic technician. The creation of this record is the provider's dictation and/or activities during the visit.    Electronically signed by:  Charlette Caffey, COT  10/28/23 12:55 PM  This document serves as a record of services personally performed by Karie Chimera, MD, PhD. It was created on their behalf by Glee Arvin. Manson Passey, OA an ophthalmic technician. The creation of this record is the provider's dictation and/or activities during the visit.    Electronically signed by: Glee Arvin. Manson Passey, OA 10/28/23 12:55 PM  Karie Chimera, M.D., Ph.D. Diseases & Surgery of the Retina and Vitreous [facility]  I have reviewed the above documentation for accuracy and completeness, and I agree  with the above. Karie Chimera, M.D., Ph.D. 10/28/23 12:55 PM  Abbreviations: M myopia (nearsighted); A astigmatism; H hyperopia (farsighted); P presbyopia; Mrx spectacle prescription;  CTL contact lenses; OD right eye; OS left eye; OU both eyes  XT exotropia; ET esotropia; PEK punctate epithelial keratitis; PEE punctate epithelial erosions; DES dry eye syndrome; MGD meibomian gland dysfunction; ATs artificial tears; PFAT's preservative free artificial tears; NSC nuclear sclerotic cataract; PSC posterior subcapsular cataract; ERM epi-retinal membrane; PVD posterior vitreous detachment; RD retinal detachment; DM diabetes mellitus; DR diabetic retinopathy; NPDR non-proliferative diabetic retinopathy; PDR proliferative diabetic retinopathy; CSME clinically significant macular edema; DME diabetic macular edema; dbh dot blot hemorrhages; CWS cotton wool spot; POAG primary open angle glaucoma; C/D cup-to-disc ratio; HVF humphrey visual field; GVF goldmann visual field; OCT optical coherence tomography; IOP intraocular pressure; BRVO Branch retinal vein occlusion; CRVO central retinal vein occlusion; CRAO central retinal artery occlusion; BRAO branch retinal artery occlusion; RT retinal tear; SB scleral buckle; PPV pars plana vitrectomy; VH Vitreous hemorrhage; PRP panretinal laser photocoagulation; IVK intravitreal kenalog; VMT vitreomacular traction; MH Macular hole;  NVD neovascularization of the disc; NVE neovascularization elsewhere; AREDS age related eye disease study; ARMD age related macular degeneration; POAG primary open angle glaucoma; EBMD epithelial/anterior basement membrane dystrophy; ACIOL anterior chamber intraocular lens; IOL intraocular lens; PCIOL posterior chamber intraocular lens; Phaco/IOL phacoemulsification with intraocular lens placement; PRK photorefractive keratectomy; LASIK  laser assisted in situ keratomileusis; HTN hypertension; DM diabetes mellitus; COPD chronic obstructive pulmonary  disease

## 2023-10-29 ENCOUNTER — Encounter (INDEPENDENT_AMBULATORY_CARE_PROVIDER_SITE_OTHER): Payer: 59 | Admitting: Ophthalmology

## 2023-11-04 ENCOUNTER — Ambulatory Visit: Payer: 59 | Attending: Internal Medicine | Admitting: Internal Medicine

## 2023-11-04 ENCOUNTER — Encounter: Payer: Self-pay | Admitting: Internal Medicine

## 2023-11-04 ENCOUNTER — Other Ambulatory Visit: Payer: Self-pay

## 2023-11-04 VITALS — BP 152/82 | HR 62 | Ht 65.0 in | Wt 172.2 lb

## 2023-11-04 DIAGNOSIS — I4719 Other supraventricular tachycardia: Secondary | ICD-10-CM

## 2023-11-04 MED ORDER — AMIODARONE HCL 200 MG PO TABS: 200.0000 mg | ORAL_TABLET | Freq: Every day | ORAL | 3 refills | Status: DC

## 2023-11-04 NOTE — Progress Notes (Signed)
HPI Michelle Morrison returns today for followup. She is a pleasant 62 yo woman with a long RP tachy and unusual AVNRT. We were able to slow her tach with ablation of the slow pathway but she remained inducible. Mapping of the left side of Koch's triangle was carried out but a His potential was visualized and the decision made not to proceed with ablation out of concern for the development of CHB. She returns today for followup. In the interim she notes no palpitations or symptoms of heart racing. No Known Allergies   Current Outpatient Medications  Medication Sig Dispense Refill   albuterol (VENTOLIN HFA) 108 (90 Base) MCG/ACT inhaler Inhale 1-2 puffs into the lungs every 4 (four) hours as needed for wheezing or shortness of breath.     amiodarone (PACERONE) 200 MG tablet Take 200 mg by mouth daily.     atorvastatin (LIPITOR) 80 MG tablet Take 1 tablet (80 mg total) by mouth daily. 90 tablet 3   dapagliflozin propanediol (FARXIGA) 10 MG TABS tablet TAKE 1 TABLET (10 MG TOTAL) BY MOUTH DAILY. 30 tablet 6   diclofenac Sodium (VOLTAREN) 1 % GEL Apply 4 g topically daily as needed (stomach pain).     furosemide (LASIX) 20 MG tablet Take 1 tablet (20 mg total) by mouth daily. Follow with cards for refill and repeat labs 30 tablet 0   hydrALAZINE (APRESOLINE) 25 MG tablet Take 1 tablet (25 mg total) by mouth 3 (three) times daily. Follow up with your PCP and cardiologist to follow up your blood pressure and for refills/to adjust dose as needed 90 tablet 1   isosorbide mononitrate (IMDUR) 30 MG 24 hr tablet Take 1 tablet (30 mg total) by mouth daily. 90 tablet 3   sacubitril-valsartan (ENTRESTO) 97-103 MG Take 1 tablet by mouth 2 (two) times daily. Follow with cards for refills 60 tablet 3   TRADJENTA 5 MG TABS tablet Take 5 mg by mouth daily.     No current facility-administered medications for this visit.     Past Medical History:  Diagnosis Date   Atrial tachycardia (HCC)    CHF (congestive  heart failure) (HCC)    a. EF 45% by echo in 03/2020 with normal cors by cath   Diabetes mellitus without complication (HCC)    Diarrhea    Heart disease    Herpes simplex infection    Hyperlipidemia    Hypertension     ROS:   All systems reviewed and negative except as noted in the HPI.   Past Surgical History:  Procedure Laterality Date   CESAREAN SECTION     CHOLECYSTECTOMY     COLONOSCOPY N/A 08/26/2014   Procedure: COLONOSCOPY;  Surgeon: West Bali, MD;  [facility];  Service: Endoscopy;  Laterality: N/A;  9:30 AM - moved to 8:30 - Ginger to notify pt   LEFT HEART CATH AND CORONARY ANGIOGRAPHY N/A 04/14/2020   Procedure: LEFT HEART CATH AND CORONARY ANGIOGRAPHY;  Surgeon: Swaziland, [name], MD;  Location: [facility] INVASIVE CV LAB;  Service: Cardiovascular;  Laterality: N/A;   RIGHT/LEFT HEART CATH AND CORONARY ANGIOGRAPHY N/A 02/19/2021   Procedure: RIGHT/LEFT HEART CATH AND CORONARY ANGIOGRAPHY;  Surgeon: Laurey Morale, MD;  Location: [facility] INVASIVE CV LAB;  Service: Cardiovascular;  Laterality: N/A;   SVT ABLATION N/A 09/30/2023   Procedure: SVT ABLATION;  Surgeon: Marinus Maw, MD;  Location: [facility] INVASIVE CV LAB;  Service: Cardiovascular;  Laterality: N/A;  Family History  Problem Relation Age of Onset   Hypertension Mother    Diabetes Mother    Colon cancer Neg Hx      Social History   Socioeconomic History   Marital status: Single    Spouse name: Not on file   Number of children: Not on file   Years of education: Not on file   Highest education level: Not on file  Occupational History   Not on file  Tobacco Use   Smoking status: Never   Smokeless tobacco: Never  Vaping Use   Vaping status: Never Used  Substance and Sexual Activity   Alcohol use: No    Alcohol/week: 0.0 standard drinks of alcohol   Drug use: No   Sexual activity: Not Currently    Birth control/protection: Post-menopausal, Abstinence  Other Topics Concern   Not on file   Social History Narrative   Not on file   Social Determinants of Health   Financial Resource Strain: Low Risk  (02/27/2021)   Overall Financial Resource Strain (CARDIA)    Difficulty of Paying Living Expenses: Not hard at all  Recent Concern: Financial Resource Strain - Medium Risk (02/19/2021)   Overall Financial Resource Strain (CARDIA)    Difficulty of Paying Living Expenses: Somewhat hard  Food Insecurity: No Food Insecurity (09/09/2023)   Hunger Vital Sign    Worried About Running Out of Food in the Last Year: Never true    Ran Out of Food in the Last Year: Never true  Transportation Needs: No Transportation Needs (09/09/2023)   PRAPARE - Administrator, Civil Service (Medical): No    Lack of Transportation (Non-Medical): No  Physical Activity: Sufficiently Active (02/27/2021)   Exercise Vital Sign    Days of Exercise per Week: 7 days    Minutes of Exercise per Session: 30 min  Stress: No Stress Concern Present (02/27/2021)   [employer] of Occupational Health - Occupational Stress Questionnaire    Feeling of Stress : Not at all  Social Connections: Unknown (02/27/2021)   Social Connection and Isolation Panel [NHANES]    Frequency of Communication with Friends and Family: More than three times a week    Frequency of Social Gatherings with Friends and Family: Once a week    Attends Religious Services: 1 to 4 times per year    Active Member of [employer] or Organizations: No    Attends Banker Meetings: Never    Marital Status: Patient declined  Catering manager Violence: Not At Risk (09/09/2023)   Humiliation, Afraid, Rape, and Kick questionnaire    Fear of Current or Ex-Partner: No    Emotionally Abused: No    Physically Abused: No    Sexually Abused: No     BP (!) 152/82   Pulse 62   Ht 5\' 5"  (1.651 m)   Wt 172 lb 3.2 oz (78.1 kg)   LMP 05/23/2007   SpO2 97%   BMI 28.66 kg/m   Physical Exam:  Well appearing NAD HEENT: Unremarkable Neck:   No JVD, no thyromegally Lymphatics:  No adenopathy Back:  No CVA tenderness Lungs:  Clear with no wheezes HEART:  Regular rate rhythm, no murmurs, no rubs, no clicks Abd:  soft, positive bowel sounds, no organomegally, no rebound, no guarding Ext:  2 plus pulses, no edema, no cyanosis, no clubbing Skin:  No rashes no nodules Neuro:  CN II through XII intact, motor grossly intact  EKG - nsr with LBBB  Assess/Plan: SVT - her ablation was unsuccessful due to the concern about developing CHB. She will undergo watchful waiting. Continue amiodarone at low dose. 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 Continue.

## 2023-11-04 NOTE — Patient Instructions (Signed)
Medication Instructions:  Your physician has recommended you make the following change in your medication:  Decrease Amiodarone to 1 tablet daily Monday - Saturday. Do not take on Sunday!  Lab Work: None ordered.  If you have labs (blood work) drawn today and your tests are completely normal, you will receive your results only by: MyChart Message (if you have MyChart) OR A paper copy in the mail If you have any lab test that is abnormal or we need to change your treatment, we will call you to review the results.  Testing/Procedures: None ordered.  Follow-Up: At [facility], you and your health needs are our priority.  As part of our continuing mission to provide you with exceptional heart care, we have created designated Provider Care Teams.  These Care Teams include your primary Cardiologist (physician) and Advanced Practice Providers (APPs -  Physician Assistants and Nurse Practitioners) who all work together to provide you with the care you need, when you need it.  Your next appointment:   March 2025   The format for your next appointment:   In Person  Provider:   Lewayne Bunting, MD{or one of the following Advanced Practice Providers on your designated Care Team:   Francis Dowse, [place] [address]" [place], [place] Earnest Rosier, NP    Important Information About Sugar

## 2023-11-25 NOTE — Progress Notes (Signed)
 [facility] - Clinic Note  12/09/2023    CHIEF COMPLAINT Patient presents for Retina Follow Up   HISTORY OF PRESENT ILLNESS: Michelle Morrison is a 62 y.o. female who presents to the clinic today for:   HPI     Retina Follow Up   Patient presents with  CRVO/BRVO.  In both eyes.  This started 6 weeks ago.  Duration of 6 weeks.  Since onset it is stable.  I, the attending physician,  performed the HPI with the patient and updated documentation appropriately.        Comments   6 week retina follow up BRVO and IVE OS pt is reporting no vision changes noticed she denies any flashes or floaters last reading 117 Monday       Last edited by Valdemar Rogue, MD on 12/09/2023  4:50 PM.      Pt states    Referring physician: [name], [name], MD [address] [place],  [place] [address]  HISTORICAL INFORMATION:   Selected notes from the MEDICAL RECORD NUMBER Referred by Dr. Darroll RUTH: 05.08.23 Ocular Hx- NPDR OS  Patient saw Dr. [name].  Vision at that time was OD 20/20, OS 20/25    CURRENT MEDICATIONS: No current outpatient medications on file. (Ophthalmic Drugs)   No current facility-administered medications for this visit. (Ophthalmic Drugs)   Current Outpatient Medications (Other)  Medication Sig   albuterol  (VENTOLIN  HFA) 108 (90 Base) MCG/ACT inhaler Inhale 1-2 puffs into the lungs every 4 (four) hours as needed for wheezing or shortness of breath.   amiodarone  (PACERONE ) 200 MG tablet Take 1 tablet (200 mg total) by mouth daily. Monday - Saturday. Do not take on Sunday.   atorvastatin  (LIPITOR ) 80 MG tablet Take 1 tablet (80 mg total) by mouth daily.   dapagliflozin  propanediol (FARXIGA ) 10 MG TABS tablet TAKE 1 TABLET (10 MG TOTAL) BY MOUTH DAILY.   diclofenac Sodium (VOLTAREN) 1 % GEL Apply 4 g topically daily as needed (stomach pain).   furosemide  (LASIX ) 20 MG tablet Take 1 tablet (20 mg total) by mouth daily. Follow with  cards for refill and repeat labs   hydrALAZINE  (APRESOLINE ) 25 MG tablet Take 1 tablet (25 mg total) by mouth 3 (three) times daily. Follow up with your PCP and cardiologist to follow up your blood pressure and for refills/to adjust dose as needed   isosorbide  mononitrate (IMDUR ) 30 MG 24 hr tablet Take 1 tablet (30 mg total) by mouth daily.   sacubitril -valsartan  (ENTRESTO ) 97-103 MG Take 1 tablet by mouth 2 (two) times daily. Follow with cards for refills   TRADJENTA  5 MG TABS tablet Take 5 mg by mouth daily.   No current facility-administered medications for this visit. (Other)   REVIEW OF SYSTEMS: ROS   Positive for: Endocrine, Cardiovascular, Eyes, Respiratory Negative for: Constitutional, Gastrointestinal, Neurological, Skin, Genitourinary, Musculoskeletal, HENT, Psychiatric, Allergic/Imm, Heme/Lymph Last edited by [name], COT on 12/09/2023  8:44 AM.     ALLERGIES No Known Allergies  PAST MEDICAL HISTORY Past Medical History:  Diagnosis Date   Atrial tachycardia (HCC)    CHF (congestive heart failure) (HCC)    a. EF 45% by echo in 03/2020 with normal cors by cath   Diabetes mellitus without complication (HCC)    Diarrhea    Heart disease    Herpes simplex infection    Hyperlipidemia    Hypertension    Past Surgical History:  Procedure Laterality Date  CESAREAN SECTION     CHOLECYSTECTOMY     COLONOSCOPY N/A 08/26/2014   Procedure: COLONOSCOPY;  Surgeon: [name], MD;  [facility];  Service: Endoscopy;  Laterality: N/A;  9:30 AM - moved to 8:30 - Ginger to notify pt   LEFT HEART CATH AND CORONARY ANGIOGRAPHY N/A 04/14/2020   Procedure: LEFT HEART CATH AND CORONARY ANGIOGRAPHY;  Surgeon: [name], [name], MD;  Location: [facility] INVASIVE CV LAB;  Service: Cardiovascular;  Laterality: N/A;   RIGHT/LEFT HEART CATH AND CORONARY ANGIOGRAPHY N/A 02/19/2021   Procedure: RIGHT/LEFT HEART CATH AND CORONARY ANGIOGRAPHY;  Surgeon: [name], MD;   Location: [facility] INVASIVE CV LAB;  Service: Cardiovascular;  Laterality: N/A;   SVT ABLATION N/A 09/30/2023   Procedure: SVT ABLATION;  Surgeon: [name], MD;  Location: [facility] INVASIVE CV LAB;  Service: Cardiovascular;  Laterality: N/A;   FAMILY HISTORY Family History  Problem Relation Age of Onset   Hypertension Mother    Diabetes Mother    Colon cancer Neg Hx    SOCIAL HISTORY Social History   Tobacco Use   Smoking status: Never   Smokeless tobacco: Never  Vaping Use   Vaping status: Never Used  Substance Use Topics   Alcohol  use: No    Alcohol /week: 0.0 standard drinks of alcohol    Drug use: No       OPHTHALMIC EXAM:  Base Eye Exam     Visual Acuity (Snellen - Linear)       Right Left   Dist [place] 20/25 20/50 -2   Dist ph [place] NI NI         Tonometry (Tonopen, 8:50 AM)       Right Left   Pressure 18 16         Pupils       Pupils Dark Light Shape React APD   Right PERRL 2 1 Round Brisk None   Left PERRL 2 1 Round Brisk None         Visual Fields       Left Right    Full Full         Extraocular Movement       Right Left    Full, Ortho Full, Ortho         Neuro/Psych     Oriented x3: Yes   Mood/Affect: Normal         Dilation     Both eyes: 2.5% Phenylephrine @ 8:50 AM           Slit Lamp and Fundus Exam     Slit Lamp Exam       Right Left   Lids/Lashes Dermatochalasis - upper lid, Meibomian gland dysfunction Dermatochalasis - upper lid, Meibomian gland dysfunction   Conjunctiva/Sclera melanosis melanosis   Cornea trace PEE trace PEE   Anterior Chamber deep, narrow temporal angle deep, narrow temporal angle   Iris Round and dilated, No NVI Round and dilated, No NVI   Lens 2+ Nuclear sclerosis, 2+ Cortical cataract 2+ Nuclear sclerosis, 2+ Cortical cataract   Anterior Vitreous Vitreous syneresis Vitreous syneresis         Fundus Exam       Right Left   Disc Pink and sharp, PPP Pink and sharp, PPP   C/D Ratio 0.6  0.6   Macula Flat, Good foveal reflex, trace cystic changes, focal punctate MA temporal to fovea, no frank edema Blunted foveal reflex, central edema -- slightly improved, perifoveal exudates, cluster  of IRH superior macula -- improving   Vessels Attenuated, Tortuous Attenuated, 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 Central Foveal Thickness: 226. Progression has been stable. Findings include normal foveal contour, no IRF, no SRF, intraretinal hyper-reflective material, vitreomacular adhesion (Focal cystic changes and IRHM IT macula -- slightly improved).   Left Eye Quality was good. Central Foveal Thickness: 265. Progression has improved. Findings include no SRF, abnormal foveal contour, subretinal hyper-reflective material, intraretinal hyper-reflective material, intraretinal fluid, vitreomacular adhesion (Mild interval improvement in IRF/IRHM superior fovea and macula, central ORA and SRHM).   Notes *Images captured and stored on drive  Diagnosis / Impression:  OD: NFP; Focal cystic changes and IRHM IT macula -- slightly improved OS: BRVO w/ CME - Mild interval improvement in IRF/IRHM superior fovea and macula, central ORA and SRHM  Clinical management:  See below  Abbreviations: NFP - Normal foveal profile. CME - cystoid macular edema. PED - pigment epithelial detachment. IRF - intraretinal fluid. SRF - subretinal fluid. EZ - ellipsoid zone. ERM - epiretinal membrane. ORA - outer retinal atrophy. ORT - outer retinal tubulation. SRHM - subretinal hyper-reflective material. IRHM - intraretinal hyper-reflective material      Intravitreal Injection, Pharmacologic Agent - OS - Left Eye       Time Out 12/09/2023. 9:50 AM. Confirmed correct patient, procedure, site, and patient  consented.   Anesthesia Topical anesthesia was used. Anesthetic medications included Lidocaine  2%, Proparacaine 0.5%.   Procedure Preparation included 5% betadine to ocular surface, eyelid speculum. A (32g) needle was used.   Injection: 2 mg aflibercept  2 MG/0.05ML   Route: Intravitreal, Site: Left Eye   NDC: [id number], Lot: [id number], Expiration date: 09/24/2024, Waste: 0 mL   Post-op Post injection exam found visual acuity of at least counting fingers. The patient tolerated the procedure well. There were no complications. The patient received written and verbal post procedure care education. Post injection medications were not given.            ASSESSMENT/PLAN:    ICD-10-CM   1. Branch retinal vein occlusion of left eye with macular edema  H34.8320 OCT, Retina - OU - Both Eyes    Intravitreal Injection, Pharmacologic Agent - OS - Left Eye    aflibercept  (EYLEA ) SOLN 2 mg    2. Essential hypertension  I10     3. Hypertensive retinopathy of both eyes  H35.033     4. Mild nonproliferative diabetic retinopathy of both eyes without macular edema associated with type 2 diabetes mellitus (HCC)  Z88.6706     5. Long term (current) use of oral hypoglycemic drugs  Z79.84     6. 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-related cataract of both eyes  H25.813      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, ?macroaneurysm - s/p IVA OS #1 (05.17.23), #2 (06.14.23), #3 (08.02.23), #4 (09.06.23), #5 (10.04.23), #6 (11.06.23) -- IVA resistance ========================================================== - s/p IVE OS #1 (12.08.23), #2 (01.17.24), #3 (03.14.24), #4 (04.25.24), #5 (06.07.24), #6 (07.29.24), #7 (09.09.24), #8 (10.22.24), #9 (12.03.24) **h/o increased fluid OS at 7 wks -- noted on 07.29.24 visit** -  BCVA OS stable at 20/50 - OCT OS: Mild interval improvement in  IRF/IRHM superior fovea and macula, central ORA and SRHM at 6 weeks - recommend IVE OS #10 today, 1.14.25 w/ f/u at 6 wks - pt wishes to proceed with injection   - RBA of procedure discussed, questions answered - IVE informed consent obtained and signed, 01.14.25 (OS) - see procedure note - F/U 6 weeks -- DFE/OCT/possible injection   2,3. Hypertensive retinopathy OU - discussed importance of tight BP control - monitor  4,5. Mild nonproliferative diabetic retinopathy w/o DME OD - exam shows rare MA OU  - FA (05.17.23) shows rare MA OU; no NV OU -- BRVO OS as above - OCT OD: NFP; Focal cystic changes and IRHM in temporal macula -- persistent - BCVA OD 20/20  - will continue to hold off on anti-VEGF therapy OD for now  - monitor  6. Mixed Cataract OU - The symptoms of cataract, surgical options, and treatments and risks were discussed with patient. - discussed diagnosis and progression - monitor  Ophthalmic Meds Ordered this visit:  Meds ordered this encounter  Medications   aflibercept  (EYLEA ) SOLN 2 mg     Return in about 6 weeks (around 01/20/2024) for f/u BRVO OS, DFE, OCT, Possible Injxn.  There are no Patient Instructions on file for this visit.   This document serves as a record of services personally performed by Redell JUDITHANN Hans, MD, PhD. It was created on their behalf by Wanda GEANNIE Keens, COT an ophthalmic technician. The creation of this record is the provider's dictation and/or activities during the visit.    Electronically signed by:  Wanda GEANNIE Keens, COT  12/11/23 1:52 AM  This document serves as a record of services personally performed by Redell JUDITHANN Hans, MD, PhD. It was created on their behalf by Alan PARAS. Delores, OA an ophthalmic technician. The creation of this record is the provider's dictation and/or activities during the visit.    Electronically signed by: Alan PARAS. Delores, OA 12/11/23 1:52 AM  Redell JUDITHANN Hans, M.D., Ph.D. Diseases & Surgery of the  Retina and Vitreous [facility]  I have reviewed the above documentation for accuracy and completeness, and I agree with the above. Redell JUDITHANN Hans, M.D., Ph.D. 12/11/23 1:52 AM   Abbreviations: M myopia (nearsighted); A astigmatism; H hyperopia (farsighted); P presbyopia; Mrx spectacle prescription;  CTL contact lenses; OD right eye; OS left eye; OU both eyes  XT exotropia; ET esotropia; PEK punctate epithelial keratitis; PEE punctate epithelial erosions; DES dry eye syndrome; MGD meibomian gland dysfunction; ATs artificial tears; PFAT's preservative free artificial tears; NSC nuclear sclerotic cataract; PSC posterior subcapsular cataract; ERM epi-retinal membrane; PVD posterior vitreous detachment; RD retinal detachment; DM diabetes mellitus; DR diabetic retinopathy; NPDR non-proliferative diabetic retinopathy; PDR proliferative diabetic retinopathy; CSME clinically significant macular edema; DME diabetic macular edema; dbh dot blot hemorrhages; CWS cotton wool spot; POAG primary open angle glaucoma; C/D cup-to-disc ratio; HVF humphrey visual field; GVF goldmann visual field; OCT optical coherence tomography; IOP intraocular pressure; BRVO Branch retinal vein occlusion; CRVO central retinal vein occlusion; CRAO central retinal artery occlusion; BRAO branch retinal artery occlusion; RT retinal tear; SB scleral buckle; PPV pars plana vitrectomy; VH Vitreous hemorrhage; PRP panretinal laser photocoagulation; IVK intravitreal kenalog; VMT vitreomacular traction; MH Macular hole;  NVD neovascularization of the disc; NVE neovascularization elsewhere; AREDS age related eye disease study; ARMD age related macular degeneration; POAG primary open angle glaucoma; EBMD epithelial/anterior basement membrane  dystrophy; ACIOL anterior chamber intraocular lens; IOL intraocular lens; PCIOL posterior chamber intraocular lens; Phaco/IOL phacoemulsification with intraocular lens placement; PRK  photorefractive keratectomy; LASIK laser assisted in situ keratomileusis; HTN hypertension; DM diabetes mellitus; COPD chronic obstructive pulmonary disease

## 2023-11-26 DIAGNOSIS — I10 Essential (primary) hypertension: Secondary | ICD-10-CM | POA: Diagnosis not present

## 2023-11-26 DIAGNOSIS — I5042 Chronic combined systolic (congestive) and diastolic (congestive) heart failure: Secondary | ICD-10-CM | POA: Diagnosis not present

## 2023-12-09 ENCOUNTER — Other Ambulatory Visit (HOSPITAL_COMMUNITY): Payer: Self-pay

## 2023-12-09 ENCOUNTER — Ambulatory Visit (INDEPENDENT_AMBULATORY_CARE_PROVIDER_SITE_OTHER): Payer: 59 | Admitting: Ophthalmology

## 2023-12-09 ENCOUNTER — Encounter (INDEPENDENT_AMBULATORY_CARE_PROVIDER_SITE_OTHER): Payer: Self-pay | Admitting: Ophthalmology

## 2023-12-09 DIAGNOSIS — H34832 Tributary (branch) retinal vein occlusion, left eye, with macular edema: Secondary | ICD-10-CM

## 2023-12-09 DIAGNOSIS — E113293 Type 2 diabetes mellitus with mild nonproliferative diabetic retinopathy without macular edema, bilateral: Secondary | ICD-10-CM

## 2023-12-09 DIAGNOSIS — Z7984 Long term (current) use of oral hypoglycemic drugs: Secondary | ICD-10-CM | POA: Diagnosis not present

## 2023-12-09 DIAGNOSIS — H35033 Hypertensive retinopathy, bilateral: Secondary | ICD-10-CM

## 2023-12-09 DIAGNOSIS — H25813 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-related cataract, bilateral: Secondary | ICD-10-CM | POA: Diagnosis not present

## 2023-12-09 DIAGNOSIS — I10 Essential (primary) hypertension: Secondary | ICD-10-CM

## 2023-12-09 MED ORDER — AFLIBERCEPT 2MG/0.05ML IZ SOLN FOR KALEIDOSCOPE
2.0000 mg | INTRAVITREAL | Status: AC | PRN
  Administered 2023-12-09: 2 mg via INTRAVITREAL

## 2023-12-11 ENCOUNTER — Ambulatory Visit: Payer: 59 | Admitting: Cardiology

## 2023-12-19 ENCOUNTER — Telehealth: Payer: Self-pay | Admitting: Cardiology

## 2023-12-19 ENCOUNTER — Other Ambulatory Visit: Payer: Self-pay | Admitting: Cardiology

## 2023-12-19 MED ORDER — FUROSEMIDE 20 MG PO TABS: 20.0000 mg | ORAL_TABLET | Freq: Every day | ORAL | 0 refills | Status: DC

## 2023-12-19 NOTE — Telephone Encounter (Signed)
Patient needs refill on her lasix 20mg . She called pharmacy they are out of refills. She is scheduled with Branch 2/4.  Thank you

## 2023-12-19 NOTE — Telephone Encounter (Signed)
Refill complete

## 2023-12-27 DIAGNOSIS — I5042 Chronic combined systolic (congestive) and diastolic (congestive) heart failure: Secondary | ICD-10-CM | POA: Diagnosis not present

## 2023-12-27 DIAGNOSIS — I10 Essential (primary) hypertension: Secondary | ICD-10-CM | POA: Diagnosis not present

## 2023-12-29 ENCOUNTER — Encounter: Payer: Self-pay | Admitting: Cardiology

## 2023-12-29 ENCOUNTER — Ambulatory Visit: Payer: 59 | Attending: Cardiology | Admitting: Cardiology

## 2023-12-29 ENCOUNTER — Other Ambulatory Visit (HOSPITAL_COMMUNITY)
Admission: RE | Admit: 2023-12-29 | Discharge: 2023-12-29 | Disposition: A | Discharge status: Home / Self Care | Payer: 59 | Source: Ambulatory Visit | Attending: Cardiology | Admitting: Cardiology

## 2023-12-29 VITALS — BP 134/65 | HR 66 | Ht 67.0 in | Wt 169.2 lb

## 2023-12-29 DIAGNOSIS — I10 Essential (primary) hypertension: Secondary | ICD-10-CM | POA: Diagnosis not present

## 2023-12-29 DIAGNOSIS — I4719 Other supraventricular tachycardia: Secondary | ICD-10-CM | POA: Insufficient documentation

## 2023-12-29 DIAGNOSIS — I5022 Chronic systolic (congestive) heart failure: Secondary | ICD-10-CM | POA: Insufficient documentation

## 2023-12-29 LAB — BASIC METABOLIC PANEL
Anion gap: 10 (ref 5–15)
BUN: 22 mg/dL (ref 8–23)
CO2: 26 mmol/L (ref 22–32)
Calcium: 9.2 mg/dL (ref 8.9–10.3)
Chloride: 102 mmol/L (ref 98–111)
Creatinine, Ser: 1.1 mg/dL — ABNORMAL HIGH (ref 0.44–1.00)
GFR, Estimated: 57 mL/min — ABNORMAL LOW (ref >60)
Glucose, Bld: 124 mg/dL — ABNORMAL HIGH (ref 70–99)
Potassium: 4.7 mmol/L (ref 3.5–5.1)
Sodium: 138 mmol/L (ref 135–145)

## 2023-12-29 LAB — MAGNESIUM: Magnesium: 2.2 mg/dL (ref 1.7–2.4)

## 2023-12-29 NOTE — Patient Instructions (Signed)
Medication Instructions:  Your physician recommends that you continue on your current medications as directed. Please refer to the Current Medication list given to you today.  *If you need a refill on your cardiac medications before your next appointment, please call your pharmacy*   Lab Work: BMET MAG  If you have labs (blood work) drawn today and your tests are completely normal, you will receive your results only by: MyChart Message (if you have MyChart) OR A paper copy in the mail If you have any lab test that is abnormal or we need to change your treatment, we will call you to review the results.   Testing/Procedures: Your physician has requested that you have an echocardiogram. Echocardiography is a painless test that uses sound waves to create images of your heart. It provides your doctor with information about the size and shape of your heart and how well your heart's chambers and valves are working. This procedure takes approximately one hour. There are no restrictions for this procedure. Please do NOT wear cologne, perfume, aftershave, or lotions (deodorant is allowed). Please arrive 15 minutes prior to your appointment time.  Please note: We ask at that you not bring children with you during ultrasound (echo/ vascular) testing. Due to room size and safety concerns, children are not allowed in the ultrasound rooms during exams. Our front office staff cannot provide observation of children in our lobby area while testing is being conducted. An adult accompanying a patient to their appointment will only be allowed in the ultrasound room at the discretion of the ultrasound technician under special circumstances. We apologize for any inconvenience.    Follow-Up: At [facility], you and your health needs are our priority.  As part of our continuing mission to provide you with exceptional heart care, we have created designated Provider Care Teams.  These Care Teams include  your primary Cardiologist (physician) and Advanced Practice Providers (APPs -  Physician Assistants and Nurse Practitioners) who all work together to provide you with the care you need, when you need it.  We recommend signing up for the patient portal called "MyChart".  Sign up information is provided on this After Visit Summary.  MyChart is used to connect with patients for Virtual Visits (Telemedicine).  Patients are able to view lab/test results, encounter notes, upcoming appointments, etc.  Non-urgent messages can be sent to your provider as well.   To learn more about what you can do with MyChart, go to [email or web address].    Your next appointment:   3 month(s)  Provider:   You may see Dina Rich, MD or one of the following Advanced Practice Providers on your designated Care Team:   Randall An, PA-C  Jacolyn Reedy, [place]     Other Instructions

## 2023-12-29 NOTE — Progress Notes (Signed)
Clinical Summary Ms. Zoeller is a 63 y.o.female seen today for follow up of the following medical problem.s     1. Chronic systolic/diastolic HF - admit 11/6107 with pulmonary edema - EKG with LBBB, had WMA on echo referred for cath - 03/2020 cath: normal coronaries, LVEDP 10 - 03/2020 echo: LVEF 45%, anteroseptal wall hypokinesis, grade II diastolic dysfunction     Admitted 01/2021 with acute on chronic HF 01/2021 echo: LVEF 25-30%, mod to severe RV dysfunction, mod to severe TR - difficult to diurese with low bp's, worsening renal function. 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      07/2021 echo: LVEF 55-60%, no WMAs, grade I dd.    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 No recent edema - compliant with meds   2. HTN - compliant with meds     3. Hyperlipidemia -compliant with meds 07/2023 TC 181 TG 112 HDL 64 LDL 95    4. PSVT/Atach - had some runs during prior hospital admission - 04/2020 monitor Min HR 38, Max HR 163, Avg HR 72. Min HR occurred in early AM hours presumably while sleeping Telemetry tracings show sinus rhythm and sinus bradycardia,several runs of atach with aberrancy up to 30 seconds.. Runs of atach followed by postconversion pauses up to 1.4 seconds. 5 beat run of NSVT No symptoms reported     01/2021 admissoins issues with atach, difficult to control due to intermittent bradycardia - started on amio gtt, transitioned to oral amio - has been  on dronederone, followed by EP  04/2023 monitor: min HR 33, Max HR 187, Avg HR 55. 3 NSVT episodes. 51 SVT episoddes longest 35 seconds.  - no recent palpitations.  -her atach can easily be confused as sinus tachycardia    09/2023 ablation was not successful - currently on amidoarone.  - no recent palpitations - compliant with meds - EKG today shows ectopic atrial rhytyhm at 57   5. OSA screen - no snoring, +daytime somnolence, no witnessed apneic episodes - HF, difficult to control atrial arrythmias, HTN   - upcoming appt with pulmonary in Feb   Past Medical History:  Diagnosis Date   Atrial tachycardia (HCC)    CHF (congestive heart failure) (HCC)    a. EF 45% by echo in 03/2020 with normal cors by cath   Diabetes mellitus without complication (HCC)    Diarrhea    Heart disease    Herpes simplex infection    Hyperlipidemia    Hypertension      No Known Allergies   Current Outpatient Medications  Medication Sig Dispense Refill   albuterol (VENTOLIN HFA) 108 (90 Base) MCG/ACT inhaler Inhale 1-2 puffs into the lungs every 4 (four) hours as needed for wheezing or shortness of breath.     amiodarone (PACERONE) 200 MG tablet Take 1 tablet (200 mg total) by mouth daily. Monday - Saturday. Do not take on Sunday. 90 tablet 3  atorvastatin (LIPITOR) 80 MG tablet Take 1 tablet (80 mg total) by mouth daily. 90 tablet 3   dapagliflozin propanediol (FARXIGA) 10 MG TABS tablet TAKE 1 TABLET (10 MG TOTAL) BY MOUTH DAILY. 30 tablet 6   diclofenac Sodium (VOLTAREN) 1 % GEL Apply 4 g topically daily as needed (stomach pain).     furosemide (LASIX) 20 MG tablet Take 1 tablet (20 mg total) by mouth daily. Follow with cards for refill and repeat labs 30 tablet 0   hydrALAZINE (APRESOLINE) 25 MG tablet Take 1 tablet (25 mg total) by mouth 3 (three) times daily. Follow up with your PCP and cardiologist to follow up your blood pressure and for refills/to adjust dose as needed 90 tablet 1    isosorbide mononitrate (IMDUR) 30 MG 24 hr tablet Take 1 tablet (30 mg total) by mouth daily. 90 tablet 3   sacubitril-valsartan (ENTRESTO) 97-103 MG Take 1 tablet by mouth 2 (two) times daily. Follow with cards for refills 60 tablet 3   TRADJENTA 5 MG TABS tablet Take 5 mg by mouth daily.     No current facility-administered medications for this visit.     Past Surgical History:  Procedure Laterality Date   CESAREAN SECTION     CHOLECYSTECTOMY     COLONOSCOPY N/A 08/26/2014   Procedure: COLONOSCOPY;  Surgeon: West Bali, MD;  Location: [facility];  Service: Endoscopy;  Laterality: N/A;  9:30 AM - moved to 8:30 - Ginger to notify pt   LEFT HEART CATH AND CORONARY ANGIOGRAPHY N/A 04/14/2020   Procedure: LEFT HEART CATH AND CORONARY ANGIOGRAPHY;  Surgeon: [place]az[place], [name], MD;  Location: [facility] INVASIVE CV LAB;  Service: Cardiovascular;  Laterality: N/A;   RIGHT/LEFT HEART CATH AND CORONARY ANGIOGRAPHY N/A 02/19/2021   Procedure: RIGHT/LEFT HEART CATH AND CORONARY ANGIOGRAPHY;  Surgeon: Laurey Morale, MD;  Location: [facility] INVASIVE CV LAB;  Service: Cardiovascular;  Laterality: N/A;   SVT ABLATION N/A 09/30/2023   Procedure: SVT ABLATION;  Surgeon: Marinus Maw, MD;  Location: [facility] INVASIVE CV LAB;  Service: Cardiovascular;  Laterality: N/A;     No Known Allergies    Family History  Problem Relation Age of Onset   Hypertension Mother    Diabetes Mother    Colon cancer Neg Hx      Social History Ms. Hatton reports that she has never smoked. She has never used smokeless tobacco. Ms. Lipps reports no history of alcohol use.   Review of Systems CONSTITUTIONAL: No weight loss, fever, chills, weakness or fatigue.  HEENT: Eyes: No visual loss, blurred vision, double vision or yellow sclerae.No hearing loss, sneezing, congestion, runny nose or sore throat.  SKIN: No rash or itching.  CARDIOVASCULAR: per hpi RESPIRATORY: No shortness of breath, cough or sputum.  GASTROINTESTINAL: No  anorexia, nausea, vomiting or diarrhea. No abdominal pain or blood.  GENITOURINARY: No burning on urination, no polyuria NEUROLOGICAL: No headache, dizziness, syncope, paralysis, ataxia, numbness or tingling in the extremities. No change in bowel or bladder control.  MUSCULOSKELETAL: No muscle, back pain, joint pain or stiffness.  LYMPHATICS: No enlarged nodes. No history of splenectomy.  PSYCHIATRIC: No history of depression or anxiety.  ENDOCRINOLOGIC: No reports of sweating, cold or heat intolerance. No polyuria or polydipsia.  Marland Kitchen   Physical Examination Today's Vitals   12/29/23 0929  BP: 138/62  Pulse: 66  SpO2: 95%  Weight: 169 lb 3.2 oz (76.7 kg)  Height: 5\' 7"  (1.702 m)   Body mass index is  26.5 kg/m.  Gen: resting comfortably, no acute distress HEENT: no scleral icterus, pupils equal round and reactive, no palptable cervical adenopathy,  CV: RRR, no m/r,g no jvd Resp: Clear to auscultation bilaterally GI: abdomen is soft, non-tender, non-distended, normal bowel sounds, no hepatosplenomegaly MSK: extremities are warm, no edema.  Skin: warm, no rash Neuro:  no focal deficits Psych: appropriate affect   Diagnostic Studies  01/2021 echo IMPRESSIONS     1. Global hypokinesis with more prominent anteroseptal hypokinesis. .  Left ventricular ejection fraction, by estimation, is 25 to 30%. The left  ventricle has severely decreased function. The left ventricle demonstrates  global hypokinesis. There is  moderate left ventricular hypertrophy. Left ventricular diastolic  parameters are indeterminate.   2. Right ventricular systolic function moderately to severely reduced.  The right ventricular size is severely enlarged. There is moderately  elevated pulmonary artery systolic pressure.   3. Left atrial size was severely dilated.   4. Right atrial size was severely dilated.   5. The mitral valve is normal in structure. No evidence of mitral valve  regurgitation. No  evidence of mitral stenosis.   6. By color TR looks moderate. Hepatic systolic flow reversal would  suggest more severe TR. . The tricuspid valve is abnormal. Tricuspid valve  regurgitation is moderate to severe.   7. The aortic valve has an indeterminant number of cusps. Aortic valve  regurgitation is not visualized. No aortic stenosis is present.   8. The inferior vena cava is normal in size with <50% respiratory  variability, suggesting right atrial pressure of 8 mmHg.    01/2021 RHC/LHC 1. Low filling pressures.  2. Preserved cardiac output.  3. No significant CAD.      01/2021 CMRI IMPRESSION: 1. Normal LV size with EF 20%, diffuse hypokinesis with septal-lateral dyssynchrony c/w LBBB.   2.  Normal RV size with EF 26%.   3. Diffuse delayed enhancement imaging, however there does appear to be LGE in the septum that could be in a coronary disease-type pattern.       03/2023 echo 1. Left ventricular ejection fraction, by estimation, is 45%. The left  ventricle has mildly decreased function. The left ventricle demonstrates  regional wall motion abnormalities (see scoring diagram/findings for  description). There is mild left  ventricular hypertrophy. Left ventricular diastolic parameters are  consistent with Grade III diastolic dysfunction (restrictive). Elevated  left atrial pressure.   2. Right ventricular systolic function is normal. The right ventricular  size is normal. There is normal pulmonary artery systolic pressure.   3. Left atrial size was moderately dilated.   4. The mitral valve is normal in structure. Trivial mitral valve  regurgitation. No evidence of mitral stenosis.   5. The tricuspid valve is abnormal.   6. The aortic valve is tricuspid. Aortic valve regurgitation is not  visualized. No aortic stenosis is present.   7. The inferior vena cava is normal in size with greater than 50%  respiratory variability, suggesting right atrial pressure of 3 mmHg.       Assessment and Plan   1.Chronic  HFrEF - no beta blocker due to bradycardia. 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   2. Atach/PSVT - per EP, currently on amiodarone - EKG today shows ectopic rhythm at 57 - no symptoms - continue  current meds   3.HTN -reasonable bp control, pending echo may titrate entresto.    4. OSA screen - signs and symptoms of OSA. HF clinic had also requested gettnig patient arranged for sleep eval -upcoming appt with pulmonary     Antoine Poche, M.D.

## 2023-12-31 ENCOUNTER — Encounter: Payer: Self-pay | Admitting: Internal Medicine

## 2024-01-07 ENCOUNTER — Other Ambulatory Visit (HOSPITAL_COMMUNITY): Payer: 59

## 2024-01-07 ENCOUNTER — Ambulatory Visit (HOSPITAL_COMMUNITY): Payer: 59

## 2024-01-07 NOTE — Progress Notes (Signed)
 [facility] - Clinic Note  01/20/2024    CHIEF COMPLAINT Patient presents for Retina Follow Up   HISTORY OF PRESENT ILLNESS: Michelle Morrison is a 63 y.o. female who presents to the clinic today for:   HPI     Retina Follow Up   Patient presents with  CRVO/BRVO.  In left eye.  This started 6 weeks ago.  I, the attending physician,  performed the HPI with the patient and updated documentation appropriately.        Comments   Patient here for 6 weeks retina follow up for BRVO OS. Patient states vision so far pretty good. No eye pain.       Last edited by [name], MD on 01/20/2024 12:08 PM.    Pt feels like vision is stable, she states her blood sugar stays around 117, she is taking her diabetes medication like she's supposed to  Referring physician: [name], MD [address] [place],  [place] [address]  HISTORICAL INFORMATION:   Selected notes from the MEDICAL RECORD NUMBER Referred by Dr. [name]: 05.08.23 Ocular Hx- NPDR OS  Patient saw Dr. [name] Feb 2022.  Vision at that time was OD 20/20, OS 20/25    CURRENT MEDICATIONS: No current outpatient medications on file. (Ophthalmic Drugs)   No current facility-administered medications for this visit. (Ophthalmic Drugs)   Current Outpatient Medications (Other)  Medication Sig   albuterol (VENTOLIN HFA) 108 (90 Base) MCG/ACT inhaler Inhale 1-2 puffs into the lungs every 4 (four) hours as needed for wheezing or shortness of breath.   amiodarone (PACERONE) 200 MG tablet Take 1 tablet (200 mg total) by mouth daily. Monday - Saturday. Do not take on Sunday.   atorvastatin (LIPITOR) 80 MG tablet Take 1 tablet (80 mg total) by mouth daily.   dapagliflozin propanediol (FARXIGA) 10 MG TABS tablet TAKE 1 TABLET (10 MG TOTAL) BY MOUTH DAILY.   sacubitril-valsartan (ENTRESTO) 97-103 MG Take 1 tablet by mouth 2 (two) times daily. Follow with cards for refills   TRADJENTA 5 MG TABS  tablet Take 5 mg by mouth daily.   diclofenac Sodium (VOLTAREN) 1 % GEL Apply 4 g topically daily as needed (stomach pain). (Patient not taking: Reported on 12/29/2023)   furosemide (LASIX) 20 MG tablet Take 1 tablet (20 mg total) by mouth daily. Follow with cards for refill and repeat labs   hydrALAZINE (APRESOLINE) 25 MG tablet Take 1 tablet (25 mg total) by mouth 3 (three) times daily. Follow up with your PCP and cardiologist to follow up your blood pressure and for refills/to adjust dose as needed   isosorbide mononitrate (IMDUR) 30 MG 24 hr tablet Take 1 tablet (30 mg total) by mouth daily.   No current facility-administered medications for this visit. (Other)   REVIEW OF SYSTEMS: ROS   Positive for: Endocrine, Cardiovascular, Eyes, Respiratory Negative for: Constitutional, Gastrointestinal, Neurological, Skin, Genitourinary, Musculoskeletal, HENT, Psychiatric, Allergic/Imm, Heme/Lymph Last edited by Laddie Aquas, COA on 01/20/2024  8:25 AM.      ALLERGIES No Known Allergies  PAST MEDICAL HISTORY Past Medical History:  Diagnosis Date   Atrial tachycardia (HCC)    CHF (congestive heart failure) (HCC)    a. EF 45% by echo in 03/2020 with normal cors by cath   Diabetes mellitus without complication (HCC)    Diarrhea    Heart disease    Herpes simplex infection    Hyperlipidemia    Hypertension  Past Surgical History:  Procedure Laterality Date   CESAREAN SECTION     CHOLECYSTECTOMY     COLONOSCOPY N/A 08/26/2014   Procedure: COLONOSCOPY;  Surgeon: West Bali, MD;  Location: [facility];  Service: Endoscopy;  Laterality: N/A;  9:30 AM - moved to 8:30 - Ginger to notify pt   LEFT HEART CATH AND CORONARY ANGIOGRAPHY N/A 04/14/2020   Procedure: LEFT HEART CATH AND CORONARY ANGIOGRAPHY;  Surgeon: Swaziland, [name], MD;  Location: [facility] INVASIVE CV LAB;  Service: Cardiovascular;  Laterality: N/A;   RIGHT/LEFT HEART CATH AND CORONARY ANGIOGRAPHY N/A 02/19/2021   Procedure:  RIGHT/LEFT HEART CATH AND CORONARY ANGIOGRAPHY;  Surgeon: Laurey Morale, MD;  Location: [facility] INVASIVE CV LAB;  Service: Cardiovascular;  Laterality: N/A;   SVT ABLATION N/A 09/30/2023   Procedure: SVT ABLATION;  Surgeon: Marinus Maw, MD;  Location: [facility] INVASIVE CV LAB;  Service: Cardiovascular;  Laterality: N/A;   FAMILY HISTORY Family History  Problem Relation Age of Onset   Hypertension Mother    Diabetes Mother    Colon cancer Neg Hx    SOCIAL HISTORY Social History   Tobacco Use   Smoking status: Never   Smokeless tobacco: Never  Vaping Use   Vaping status: Never Used  Substance Use Topics   Alcohol use: No    Alcohol/week: 0.0 standard drinks of alcohol   Drug use: No       OPHTHALMIC EXAM:  Base Eye Exam     Visual Acuity (Snellen - Linear)       Right Left   Dist [place] 20/25 -1 20/50 -2   Dist ph [place] NI NI         Tonometry (Tonopen, 8:23 AM)       Right Left   Pressure 17 17         Pupils       Dark Light Shape React APD   Right 2 1 Round Brisk None   Left 2 1 Round Brisk None         Visual Fields (Counting fingers)       Left Right    Full Full         Extraocular Movement       Right Left    Full, Ortho Full, Ortho         Neuro/Psych     Oriented x3: Yes   Mood/Affect: Normal         Dilation     Both eyes: 1.0% Mydriacyl, 2.5% Phenylephrine @ 8:23 AM           Slit Lamp and Fundus Exam     Slit Lamp Exam       Right Left   Lids/Lashes Dermatochalasis - upper lid, Meibomian gland dysfunction Dermatochalasis - upper lid, Meibomian gland dysfunction   Conjunctiva/Sclera melanosis melanosis   Cornea trace PEE trace PEE   Anterior Chamber deep, narrow temporal angle deep, narrow temporal angle   Iris Round and dilated, No NVI Round and dilated, No NVI   Lens 2+ Nuclear sclerosis, 2+ Cortical cataract 2+ Nuclear sclerosis, 2+ Cortical cataract   Anterior Vitreous Vitreous syneresis Vitreous syneresis          Fundus Exam       Right Left   Disc Pink and sharp, PPP Pink and sharp, PPP   C/D Ratio 0.6 0.6   Macula Flat, Good foveal reflex, focal punctate MA temporal to fovea -- improved, no frank edema Blunted  foveal reflex, central edema -- persistent, punctate perifoveal exudates, cluster of IRH superior macula -- improving   Vessels Attenuated, Tortuous Attenuated, old BRVO with macroaneurysm superior macula, mild tortuosity   Periphery Attached, rare MA Attached, rare MA           IMAGING AND PROCEDURES  Imaging and Procedures for 01/20/2024  OCT, Retina - OU - Both Eyes       Right Eye Quality was good. Central Foveal Thickness: 230. Progression has improved. Findings include normal foveal contour, no IRF, no SRF, intraretinal hyper-reflective material, vitreomacular adhesion (Focal cystic changes and IRHM IT macula -- slightly improved).   Left Eye Quality was good. Central Foveal Thickness: 261. Progression has been stable. Findings include no SRF, abnormal foveal contour, subretinal hyper-reflective material, intraretinal hyper-reflective material, intraretinal fluid, vitreomacular adhesion (Persistent IRF/IRHM superior fovea and macula, central ORA and SRHM).   Notes *Images captured and stored on drive  Diagnosis / Impression:  OD: NFP; Focal cystic changes and IRHM IT macula -- slightly improved OS: BRVO w/ CME - persistent IRF/IRHM superior fovea and macula, central ORA and SRHM  Clinical management:  See below  Abbreviations: NFP - Normal foveal profile. CME - cystoid macular edema. PED - pigment epithelial detachment. IRF - intraretinal fluid. SRF - subretinal fluid. EZ - ellipsoid zone. ERM - epiretinal membrane. ORA - outer retinal atrophy. ORT - outer retinal tubulation. SRHM - subretinal hyper-reflective material. IRHM - intraretinal hyper-reflective material      Intravitreal Injection, Pharmacologic Agent - OS - Left Eye       Time Out 01/20/2024. 10:08 AM.  Confirmed correct patient, procedure, site, and patient consented.   Anesthesia Topical anesthesia was used. Anesthetic medications included Lidocaine 2%, Proparacaine 0.5%.   Procedure Preparation included 5% betadine to ocular surface, eyelid speculum. A (32g) needle was used.   Injection: 2 mg aflibercept 2 MG/0.05ML   Route: Intravitreal, Site: Left Eye   NDC: L6038910, Lot: [id number], Expiration date: 12/25/2024, Waste: 0 mL   Post-op Post injection exam found visual acuity of at least counting fingers. The patient tolerated the procedure well. There were no complications. The patient received written and verbal post procedure care education. Post injection medications were not given.             ASSESSMENT/PLAN:    ICD-10-CM   1. Branch retinal vein occlusion of left eye with macular edema  H34.8320 OCT, Retina - OU - Both Eyes    Intravitreal Injection, Pharmacologic Agent - OS - Left Eye    aflibercept (EYLEA) SOLN 2 mg    2. Essential hypertension  I10     3. Hypertensive retinopathy of both eyes  H35.033     4. Mild nonproliferative diabetic retinopathy of both eyes without macular edema associated with type 2 diabetes mellitus (HCC)  Z30.8657     5. Long term (current) use of oral hypoglycemic drugs  Z79.84     6. 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-related cataract of both eyes  H25.813      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, ?macroaneurysm - s/p IVA OS #1 (05.17.23), #2 (06.14.23), #3 (08.02.23), #4 (09.06.23), #5 (10.04.23), #6 (11.06.23) -- IVA resistance ========================================================== - s/p IVE OS #1 (12.08.23), #2 (01.17.24), #3 (03.14.24), #4 (04.25.24), #5 (06.07.24), #6 (07.29.24), #7 (09.09.24), #8 (10.22.24), #9 (12.03.24), #10 (01.14.25) **h/o increased fluid OS at  7 wks -- noted on 07.29.24  visit** - BCVA OS stable at 20/50 - OCT OS: persistent IRF/IRHM superior fovea and macula, central ORA and SRHM at 6 weeks **discussed decreased efficacy / resistance to Eylea and potential benefit of switching medication* - recommend IVE OS #11 today, 02.25.25 w/ f/u at 6 wks - pt wishes to proceed with injection   - RBA of procedure discussed, questions answered - IVE informed consent obtained and signed, 01.14.25 (OS) - see procedure note - F/U 6 weeks -- DFE/OCT/possible injection  - will check V[employer] auth for next visit  2,3. Hypertensive retinopathy OU - discussed importance of tight BP control - monitor  4,5. Mild nonproliferative diabetic retinopathy w/o DME OD - exam shows rare MA OU  - FA (05.17.23) shows rare MA OU; no NV OU -- BRVO OS as above - OCT OD: NFP; Focal cystic changes and IRHM in temporal macula -- persistent - BCVA OD 20/20  - will continue to hold off on anti-VEGF therapy OD for now  - monitor  6. Mixed Cataract OU - The symptoms of cataract, surgical options, and treatments and risks were discussed with patient. - discussed diagnosis and progression - monitor  Ophthalmic Meds Ordered this visit:  Meds ordered this encounter  Medications   aflibercept (EYLEA) SOLN 2 mg     Return in about 6 weeks (around 03/02/2024) for f/u BRVO OS, DFE, OCT, Possible Injxn.  There are no Patient Instructions on file for this visit.   This document serves as a record of services personally performed by Karie Chimera, MD, PhD. It was created on their behalf by Charlette Caffey, COT an ophthalmic technician. The creation of this record is the provider's dictation and/or activities during the visit.    Electronically signed by:  Charlette Caffey, COT  01/20/24 12:11 PM  This document serves as a record of services personally performed by Karie Chimera, MD, PhD. It was created on their behalf by Glee Arvin. Manson Passey, OA an ophthalmic technician. The creation of  this record is the provider's dictation and/or activities during the visit.    Electronically signed by: Glee Arvin. Manson Passey, OA 01/20/24 12:11 PM  Karie Chimera, M.D., Ph.D. Diseases & Surgery of the Retina and Vitreous [facility]  I have reviewed the above documentation for accuracy and completeness, and I agree with the above. Karie Chimera, M.D., Ph.D. 01/20/24 12:12 PM   Abbreviations: M myopia (nearsighted); A astigmatism; H hyperopia (farsighted); P presbyopia; Mrx spectacle prescription;  CTL contact lenses; OD right eye; OS left eye; OU both eyes  XT exotropia; ET esotropia; PEK punctate epithelial keratitis; PEE punctate epithelial erosions; DES dry eye syndrome; MGD meibomian gland dysfunction; ATs artificial tears; PFAT's preservative free artificial tears; NSC nuclear sclerotic cataract; PSC posterior subcapsular cataract; ERM epi-retinal membrane; PVD posterior vitreous detachment; RD retinal detachment; DM diabetes mellitus; DR diabetic retinopathy; NPDR non-proliferative diabetic retinopathy; PDR proliferative diabetic retinopathy; CSME clinically significant macular edema; DME diabetic macular edema; dbh dot blot hemorrhages; CWS cotton wool spot; POAG primary open angle glaucoma; C/D cup-to-disc ratio; HVF humphrey visual field; GVF goldmann visual field; OCT optical coherence tomography; IOP intraocular pressure; BRVO Branch retinal vein occlusion; CRVO central retinal vein occlusion; CRAO central retinal artery occlusion; BRAO branch retinal artery occlusion; RT retinal tear; SB scleral buckle; PPV pars plana vitrectomy; VH Vitreous hemorrhage; PRP panretinal laser photocoagulation; IVK intravitreal kenalog; VMT vitreomacular traction; MH Macular hole;  NVD neovascularization  of the disc; NVE neovascularization elsewhere; AREDS age related eye disease study; ARMD age related macular degeneration; POAG primary open angle glaucoma; EBMD epithelial/anterior  basement membrane dystrophy; ACIOL anterior chamber intraocular lens; IOL intraocular lens; PCIOL posterior chamber intraocular lens; Phaco/IOL phacoemulsification with intraocular lens placement; PRK photorefractive keratectomy; LASIK laser assisted in situ keratomileusis; HTN hypertension; DM diabetes mellitus; COPD chronic obstructive pulmonary disease

## 2024-01-12 ENCOUNTER — Ambulatory Visit (INDEPENDENT_AMBULATORY_CARE_PROVIDER_SITE_OTHER): Payer: 59 | Admitting: Primary Care

## 2024-01-12 ENCOUNTER — Encounter: Payer: Self-pay | Admitting: Primary Care

## 2024-01-12 VITALS — BP 173/77 | HR 68 | Ht 67.0 in | Wt 171.0 lb

## 2024-01-12 DIAGNOSIS — Z9189 Other specified personal risk factors, not elsewhere classified: Secondary | ICD-10-CM

## 2024-01-12 DIAGNOSIS — I5022 Chronic systolic (congestive) heart failure: Secondary | ICD-10-CM | POA: Diagnosis not present

## 2024-01-12 DIAGNOSIS — I4719 Other supraventricular tachycardia: Secondary | ICD-10-CM

## 2024-01-12 NOTE — Patient Instructions (Addendum)
-  POSSIBLE SLEEP APNEA: Sleep apnea is a condition where breathing repeatedly stops and starts during sleep. It can increase the risk of heart problems if untreated. We have ordered a home sleep study to evaluate this further due to your transportation limitations. If sleep apnea is confirmed, potential treatments include weight loss, oral appliances, CPAP, and possibly consulting an ENT specialist for surgical options.  -SUPRAVENTRICULAR TACHYCARDIA: Supraventricular tachycardia is a condition where the heart suddenly beats much faster than normal. You had a successful ablation procedure in November, and there have been no recurrences of symptoms. Please continue your current cardiac medications as directed by your cardiologist.  -HEART FAILURE AND HYPERTENSION: Heart failure is when the heart doesn't pump blood as well as it should, and hypertension is high blood pressure. Both conditions are being managed by your cardiologist. Please continue your current cardiac medications as directed by your cardiologist.  INSTRUCTIONS:  Please complete the home sleep study as ordered. Follow up with your cardiologist as scheduled and continue taking your prescribed medications. If the sleep study confirms sleep apnea, we will discuss the appropriate treatment options.  Follow-up 6-8 weeks with Waynetta Sandy NP to review sleep study

## 2024-01-12 NOTE — Progress Notes (Signed)
@Patient  ID: Leonette Nutting, female    DOB: [birth year], 63 y.o.   MRN: [id number]  Chief Complaint  Patient presents with   Establish Care    Referring provider: Antoine Poche, MD  HPI: 63 year old female, never smoked. PMH significant for CHF, HTN, aflutter, type 2 diabetes, CKD.   01/12/2024 Discussed the use of AI scribe software for clinical note transcription with the patient, who gave verbal consent to proceed.  History of Present Illness   AUBREANNA PERCLE is a 63 year old female with atrial fibrillation, supraventricular tachycardia, heart failure, and hypertension who presents for a sleep consult. She was referred by her cardiologist, for evaluation of potential sleep apnea.  She occasionally wakes up gasping or catching her breath. She has never been told she snores, as she sleeps alone. She does not have a set bedtime, typically going to bed between 10 PM and midnight, and does not have difficulty falling asleep. She wakes up once or twice a night to use the restroom, which she attributes to her diuretic medication. She usually gets up for the day around 7 AM.  Her cardiac history includes atrial fibrillation, supraventricular tachycardia, heart failure, and hypertension. She underwent a cardiac ablation in November, which has since resolved her arrhythmia symptoms. She is under the care of Dr. Dina Rich and Dr. Sharrell Ku for her heart conditions.  She continues to take her prescribed medications, including Lasix 20 mg once daily in the morning.  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 Epworth score 2/24. No concern for narcolepsy, cataplexy or sleep walking.            No Known Allergies  Immunization History  Administered Date(s) Administered   Influenza-Unspecified 11/25/2018   Moderna Sars-Covid-2 Vaccination 02/17/2020, 03/16/2020    Past Medical History:  Diagnosis Date   Atrial tachycardia (HCC)    CHF  (congestive heart failure) (HCC)    a. EF 45% by echo in 03/2020 with normal cors by cath   Diabetes mellitus without complication (HCC)    Diarrhea    Heart disease    Herpes simplex infection    Hyperlipidemia    Hypertension     Tobacco History: Social History   Tobacco Use  Smoking Status Never  Smokeless Tobacco Never   Counseling given: Not Answered   Outpatient Medications Prior to Visit  Medication Sig Dispense Refill   albuterol (VENTOLIN HFA) 108 (90 Base) MCG/ACT inhaler Inhale 1-2 puffs into the lungs every 4 (four) hours as needed for wheezing or shortness of breath.     amiodarone (PACERONE) 200 MG tablet Take 1 tablet (200 mg total) by mouth daily. Monday - Saturday. Do not take on Sunday. 90 tablet 3   atorvastatin (LIPITOR) 80 MG tablet Take 1 tablet (80 mg total) by mouth daily. 90 tablet 3   dapagliflozin propanediol (FARXIGA) 10 MG TABS tablet TAKE 1 TABLET (10 MG TOTAL) BY MOUTH DAILY. 30 tablet 6   furosemide (LASIX) 20 MG tablet Take 1 tablet (20 mg total) by mouth daily. Follow with cards for refill and repeat labs 30 tablet 0   sacubitril-valsartan (ENTRESTO) 97-103 MG Take 1 tablet by mouth 2 (two) times daily. Follow with cards for refills 60 tablet 3   TRADJENTA 5 MG TABS tablet Take 5 mg by mouth daily.     diclofenac Sodium (VOLTAREN) 1 % GEL Apply 4 g topically daily as needed (stomach pain). (Patient not  taking: Reported on 12/29/2023)     hydrALAZINE (APRESOLINE) 25 MG tablet Take 1 tablet (25 mg total) by mouth 3 (three) times daily. Follow up with your PCP and cardiologist to follow up your blood pressure and for refills/to adjust dose as needed 90 tablet 1   isosorbide mononitrate (IMDUR) 30 MG 24 hr tablet Take 1 tablet (30 mg total) by mouth daily. 90 tablet 3   No facility-administered medications prior to visit.   Review of Systems  Review of Systems  Constitutional:  Positive for fatigue.  Respiratory: Negative.    Cardiovascular:  Negative.   Psychiatric/Behavioral:  Positive for sleep disturbance.    Physical Exam  BP (!) 173/77   Pulse 68   Ht 5\' 7"  (1.702 m)   Wt 171 lb (77.6 kg)   LMP 05/23/2007   SpO2 97%   BMI 26.78 kg/m  Physical Exam Constitutional:      General: She is not in acute distress.    Appearance: Normal appearance. She is not ill-appearing.  Cardiovascular:     Rate and Rhythm: Normal rate and regular rhythm.  Pulmonary:     Effort: Pulmonary effort is normal.     Breath sounds: Normal breath sounds.  Musculoskeletal:     Cervical back: Neck supple.  Neurological:     General: No focal deficit present.     Mental Status: She is alert and oriented to person, place, and time. Mental status is at baseline.  Psychiatric:        Mood and Affect: Mood normal.        Behavior: Behavior normal.        Thought Content: Thought content normal.        Judgment: Judgment normal.      Lab Results:  CBC    Component Value Date/Time   WBC 4.4 09/26/2023 1225   RBC 4.79 09/26/2023 1225   HGB 13.7 09/26/2023 1225   HCT 43.4 09/26/2023 1225   PLT 233 09/26/2023 1225   MCV 90.6 09/26/2023 1225   MCH 28.6 09/26/2023 1225   MCHC 31.6 09/26/2023 1225   RDW 15.6 (H) 09/26/2023 1225   LYMPHSABS 1.1 09/26/2023 1225   MONOABS 0.7 09/26/2023 1225   EOSABS 0.1 09/26/2023 1225   BASOSABS 0.1 09/26/2023 1225    BMET    Component Value Date/Time   NA 138 12/29/2023 1030   K 4.7 12/29/2023 1030   CL 102 12/29/2023 1030   CO2 26 12/29/2023 1030   GLUCOSE 124 (H) 12/29/2023 1030   BUN 22 12/29/2023 1030   CREATININE 1.10 (H) 12/29/2023 1030   CALCIUM 9.2 12/29/2023 1030   GFRNONAA 57 (L) 12/29/2023 1030   GFRAA >60 06/10/2020 0640    BNP    Component Value Date/Time   BNP 3,202.0 (H) 09/09/2023 1750    ProBNP No results found for: "PROBNP"  Imaging: No results found.   Assessment & Plan:   1. At risk for sleep apnea (Primary) - Home sleep test; Future  2. Atrial  tachycardia, paroxysmal (HCC) - Home sleep test; Future  3. Chronic systolic CHF (congestive heart failure) (HCC) - Home sleep test; Future   Possible Sleep Apnea Patient referred by cardiology for evaluation due to history of cardiac arrhythmias and heart failure. Patient reports occasional waking up gasping for breath and frequent nocturnal urination, but no known snoring. Discussed the potential cardiac risks associated with untreated sleep apnea. -Order home sleep study due to patient's transportation limitations. -Discussed potential treatment options  if sleep apnea is confirmed, including weight loss, oral appliances, CPAP, and possible ENT consultation for surgical options.  Supraventricular Tachycardia History of ablation in November for supraventricular tachycardia. No recurrence of symptoms since the procedure. -Continue current cardiac medications as directed by cardiology.  Heart Failure and Hypertension Chronic conditions managed by cardiology. -Continue current cardiac medications as directed by cardiology.      Glenford Bayley, NP 01/12/2024

## 2024-01-20 ENCOUNTER — Ambulatory Visit (INDEPENDENT_AMBULATORY_CARE_PROVIDER_SITE_OTHER): Payer: 59 | Admitting: Ophthalmology

## 2024-01-20 ENCOUNTER — Encounter (INDEPENDENT_AMBULATORY_CARE_PROVIDER_SITE_OTHER): Payer: Self-pay | Admitting: Ophthalmology

## 2024-01-20 DIAGNOSIS — H25813 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-related cataract, bilateral: Secondary | ICD-10-CM | POA: Diagnosis not present

## 2024-01-20 DIAGNOSIS — H35033 Hypertensive retinopathy, bilateral: Secondary | ICD-10-CM | POA: Diagnosis not present

## 2024-01-20 DIAGNOSIS — I10 Essential (primary) hypertension: Secondary | ICD-10-CM

## 2024-01-20 DIAGNOSIS — Z7984 Long term (current) use of oral hypoglycemic drugs: Secondary | ICD-10-CM

## 2024-01-20 DIAGNOSIS — E113293 Type 2 diabetes mellitus with mild nonproliferative diabetic retinopathy without macular edema, bilateral: Secondary | ICD-10-CM

## 2024-01-20 DIAGNOSIS — H34832 Tributary (branch) retinal vein occlusion, left eye, with macular edema: Secondary | ICD-10-CM

## 2024-01-20 MED ORDER — AFLIBERCEPT 2MG/0.05ML IZ SOLN FOR KALEIDOSCOPE
2.0000 mg | INTRAVITREAL | Status: AC | PRN
  Administered 2024-01-20: 2 mg via INTRAVITREAL

## 2024-01-21 ENCOUNTER — Ambulatory Visit (HOSPITAL_COMMUNITY): Admission: RE | Admit: 2024-01-21 | Payer: 59 | Source: Ambulatory Visit

## 2024-01-30 ENCOUNTER — Encounter

## 2024-01-30 ENCOUNTER — Other Ambulatory Visit: Payer: Self-pay | Admitting: Cardiology

## 2024-01-30 DIAGNOSIS — Z9189 Other specified personal risk factors, not elsewhere classified: Secondary | ICD-10-CM

## 2024-01-30 DIAGNOSIS — G4730 Sleep apnea, unspecified: Secondary | ICD-10-CM | POA: Diagnosis not present

## 2024-01-30 DIAGNOSIS — I5022 Chronic systolic (congestive) heart failure: Secondary | ICD-10-CM

## 2024-01-30 DIAGNOSIS — I4719 Other supraventricular tachycardia: Secondary | ICD-10-CM

## 2024-02-02 ENCOUNTER — Ambulatory Visit: Payer: 59 | Attending: Internal Medicine | Admitting: Internal Medicine

## 2024-02-02 ENCOUNTER — Encounter: Payer: Self-pay | Admitting: Internal Medicine

## 2024-02-02 VITALS — BP 116/60 | HR 78 | Ht 67.0 in | Wt 174.0 lb

## 2024-02-02 DIAGNOSIS — I447 Left bundle-branch block, unspecified: Secondary | ICD-10-CM | POA: Diagnosis not present

## 2024-02-02 NOTE — Patient Instructions (Signed)
 Medication Instructions:  Your physician recommends that you continue on your current medications as directed. Please refer to the Current Medication list given to you today.  *If you need a refill on your cardiac medications before your next appointment, please call your pharmacy*   Lab Work: NONE   If you have labs (blood work) drawn today and your tests are completely normal, you will receive your results only by: MyChart Message (if you have MyChart) OR A paper copy in the mail If you have any lab test that is abnormal or we need to change your treatment, we will call you to review the results.   Testing/Procedures: Your physician has requested that you have an echocardiogram. Echocardiography is a painless test that uses sound waves to create images of your heart. It provides your doctor with information about the size and shape of your heart and how well your heart's chambers and valves are working. This procedure takes approximately one hour. There are no restrictions for this procedure. Please do NOT wear cologne, perfume, aftershave, or lotions (deodorant is allowed). Please arrive 15 minutes prior to your appointment time.  Please note: We ask at that you not bring children with you during ultrasound (echo/ vascular) testing. Due to room size and safety concerns, children are not allowed in the ultrasound rooms during exams. Our front office staff cannot provide observation of children in our lobby area while testing is being conducted. An adult accompanying a patient to their appointment will only be allowed in the ultrasound room at the discretion of the ultrasound technician under special circumstances. We apologize for any inconvenience.    Follow-Up: At [facility], you and your health needs are our priority.  As part of our continuing mission to provide you with exceptional heart care, we have created designated Provider Care Teams.  These Care Teams include your  primary Cardiologist (physician) and Advanced Practice Providers (APPs -  Physician Assistants and Nurse Practitioners) who all work together to provide you with the care you need, when you need it.  We recommend signing up for the patient portal called "MyChart".  Sign up information is provided on this After Visit Summary.  MyChart is used to connect with patients for Virtual Visits (Telemedicine).  Patients are able to view lab/test results, encounter notes, upcoming appointments, etc.  Non-urgent messages can be sent to your provider as well.   To learn more about what you can do with MyChart, go to [email or web address].    Your next appointment:    May   Provider:   Lewayne Bunting, MD    Other Instructions Thank you for choosing [facility]!

## 2024-02-02 NOTE — Progress Notes (Signed)
 HPI Michelle Morrison returns today for followup. She is a pleasant 63 yo woman with a long RP tachy and unusual AVNRT. We were able to slow her tach with ablation of the slow pathway but she remained inducible. Mapping of the left side of Koch's triangle was carried out but a His potential was visualized and the decision made not to proceed with ablation out of concern for the development of CHB. She returns today for followup. In the interim she notes no palpitations or symptoms of heart racing since starting amiodarone. Her EF at her echo in October demonstrates preserved LV function.  No Known Allergies   Current Outpatient Medications  Medication Sig Dispense Refill   albuterol (VENTOLIN HFA) 108 (90 Base) MCG/ACT inhaler Inhale 1-2 puffs into the lungs every 4 (four) hours as needed for wheezing or shortness of breath.     amiodarone (PACERONE) 200 MG tablet Take 1 tablet (200 mg total) by mouth daily. Monday - Saturday. Do not take on Sunday. 90 tablet 3   atorvastatin (LIPITOR) 80 MG tablet Take 1 tablet (80 mg total) by mouth daily. 90 tablet 3   dapagliflozin propanediol (FARXIGA) 10 MG TABS tablet TAKE 1 TABLET (10 MG TOTAL) BY MOUTH DAILY. 30 tablet 6   diclofenac Sodium (VOLTAREN) 1 % GEL Apply 4 g topically daily as needed (stomach pain).     furosemide (LASIX) 20 MG tablet TAKE 1 TABLET(20 MG) BY MOUTH DAILY. FOLLOW WITH CARDS FOR REFILL AND REPEAT LABS 30 tablet 2   hydrALAZINE (APRESOLINE) 25 MG tablet Take 1 tablet (25 mg total) by mouth 3 (three) times daily. Follow up with your PCP and cardiologist to follow up your blood pressure and for refills/to adjust dose as needed 90 tablet 1   isosorbide mononitrate (IMDUR) 30 MG 24 hr tablet Take 1 tablet (30 mg total) by mouth daily. 90 tablet 3   sacubitril-valsartan (ENTRESTO) 97-103 MG Take 1 tablet by mouth 2 (two) times daily. Follow with cards for refills 60 tablet 3   TRADJENTA 5 MG TABS tablet Take 5 mg by mouth daily.      No current facility-administered medications for this visit.     Past Medical History:  Diagnosis Date   Atrial tachycardia (HCC)    CHF (congestive heart failure) (HCC)    a. EF 45% by echo in 03/2020 with normal cors by cath   Diabetes mellitus without complication (HCC)    Diarrhea    Heart disease    Herpes simplex infection    Hyperlipidemia    Hypertension     ROS:   All systems reviewed and negative except as noted in the HPI.   Past Surgical History:  Procedure Laterality Date   CESAREAN SECTION     CHOLECYSTECTOMY     COLONOSCOPY N/A 08/26/2014   Procedure: COLONOSCOPY;  Surgeon: West Bali, MD;  Location[facility];  Service: Endoscopy;  Laterality: N/A;  9:30 AM - moved to 8:30 - Ginger to notify pt   LEFT HEART CATH AND CORONARY ANGIOGRAPHY N/A 04/14/2020   Procedure: LEFT HEART CATH AND CORONARY ANGIOGRAPHY;  Surgeon: Swazil[place], [name], MD;  Location: [facility] INVASIVE CV LAB;  Service: Cardiovascular;  Laterality: N/A;   RIGHT/LEFT HEART CATH AND CORONARY ANGIOGRAPHY N/A 02/19/2021   Procedure: RIGHT/LEFT HEART CATH AND CORONARY ANGIOGRAPHY;  Surgeon: Laurey Morale, MD;  Location: [facility] INVASIVE CV LAB;  Service: Cardiovascular;  Laterality: N/A;   SVT ABLATION N/A 09/30/2023  Procedure: SVT ABLATION;  Surgeon: Marinus Maw, MD;  Location: [facility] INVASIVE CV LAB;  Service: Cardiovascular;  Laterality: N/A;     Family History  Problem Relation Age of Onset   Hypertension Mother    Diabetes Mother    Colon cancer Neg Hx      Social History   Socioeconomic History   Marital status: Single    Spouse name: Not on file   Number of children: Not on file   Years of education: Not on file   Highest education level: Not on file  Occupational History   Not on file  Tobacco Use   Smoking status: Never   Smokeless tobacco: Never  Vaping Use   Vaping status: Never Used  Substance and Sexual Activity   Alcohol use: No    Alcohol/week: 0.0 standard drinks  of alcohol   Drug use: No   Sexual activity: Not Currently    Birth control/protection: Post-menopausal, Abstinence  Other Topics Concern   Not on file  Social History Narrative   Not on file   Social Drivers of Health   Financial Resource Strain: Low Risk  (02/27/2021)   Overall Financial Resource Strain (CARDIA)    Difficulty of Paying Living Expenses: Not hard at all  Recent Concern: Financial Resource Strain - Medium Risk (02/19/2021)   Overall Financial Resource Strain (CARDIA)    Difficulty of Paying Living Expenses: Somewhat hard  Food Insecurity: No Food Insecurity (09/09/2023)   Hunger Vital Sign    Worried About Running Out of Food in the Last Year: Never true    Ran Out of Food in the Last Year: Never true  Transportation Needs: No Transportation Needs (09/09/2023)   PRAPARE - Administrator, Civil Service (Medical): No    Lack of Transportation (Non-Medical): No  Physical Activity: Sufficiently Active (02/27/2021)   Exercise Vital Sign    Days of Exercise per Week: 7 days    Minutes of Exercise per Session: 30 min  Stress: No Stress Concern Present (02/27/2021)   [employer] of Occupational Health - Occupational Stress Questionnaire    Feeling of Stress : Not at all  Social Connections: Unknown (02/27/2021)   Social Connection and Isolation Panel [NHANES]    Frequency of Communication with Friends and Family: More than three times a week    Frequency of Social Gatherings with Friends and Family: Once a week    Attends Religious Services: 1 to 4 times per year    Active Member of [employer] or Organizations: No    Attends Banker Meetings: Never    Marital Status: Patient declined  Catering manager Violence: Not At Risk (09/09/2023)   Humiliation, Afraid, Rape, and Kick questionnaire    Fear of Current or Ex-Partner: No    Emotionally Abused: No    Physically Abused: No    Sexually Abused: No     BP 116/60 (BP Location: Left Arm, Cuff Size:  Large)   Pulse 78   Ht 5\' 7"  (1.702 m)   Wt 174 lb (78.9 kg)   LMP 05/23/2007   SpO2 98%   BMI 27.25 kg/m   Physical Exam:  Well appearing NAD HEENT: Unremarkable Neck:  No JVD, no thyromegally Lymphatics:  No adenopathy Back:  No CVA tenderness Lungs:  Clear with no wheezes HEART:  Regular rate rhythm, no murmurs, no rubs, no clicks Abd:  soft, positive bowel sounds, no organomegally, no rebound, no guarding Ext:  2 plus pulses, no  edema, no cyanosis, no clubbing Skin:  No rashes no nodules Neuro:  CN II through XII intact, motor grossly intact   Assess/Plan:  SVT - her ablation was unsuccessful due to the concern about developing CHB. She will undergo watchful waiting. Continue amiodarone at low dose. 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 Continue.  Chronic systolic heart failure - her EF has been up and down. I would like her to repeat the 2D echo in April, 6 months after the last echo. If the EF is still down, we will plan to pursue biv ICD.  Sharlot Gowda Zyanna Leisinger,MD

## 2024-02-04 ENCOUNTER — Other Ambulatory Visit: Payer: Self-pay | Admitting: Cardiology

## 2024-02-04 ENCOUNTER — Ambulatory Visit (HOSPITAL_COMMUNITY)
Admission: RE | Admit: 2024-02-04 | Discharge: 2024-02-04 | Disposition: A | Discharge status: Home / Self Care | Payer: 59 | Source: Ambulatory Visit | Attending: Cardiology | Admitting: Cardiology

## 2024-02-04 DIAGNOSIS — I5022 Chronic systolic (congestive) heart failure: Secondary | ICD-10-CM

## 2024-02-04 DIAGNOSIS — I10 Essential (primary) hypertension: Secondary | ICD-10-CM

## 2024-02-04 DIAGNOSIS — I4719 Other supraventricular tachycardia: Secondary | ICD-10-CM

## 2024-02-04 LAB — ECHOCARDIOGRAM COMPLETE
AR max vel: 1.75 cm2
AV Area VTI: 1.89 cm2
AV Area mean vel: 1.94 cm2
AV Mean grad: 4.6 mmHg
AV Peak grad: 9.6 mmHg
Ao pk vel: 1.55 m/s
Area-P 1/2: 1.89 cm2
MV VTI: 2.37 cm2
S' Lateral: 2.8 cm

## 2024-02-10 ENCOUNTER — Telehealth: Payer: Self-pay | Admitting: Cardiology

## 2024-02-10 ENCOUNTER — Other Ambulatory Visit: Payer: Self-pay | Admitting: Cardiology

## 2024-02-10 MED ORDER — HYDRALAZINE HCL 25 MG PO TABS: 25.0000 mg | ORAL_TABLET | Freq: Three times a day (TID) | ORAL | 1 refills | Status: DC

## 2024-02-10 NOTE — Telephone Encounter (Signed)
 Pt's daughter came in the office today and stated that the pt tried to refill her RX for hydralazine 25 mg and it could not be refilled because it was prescribed at [facility]. Pt would like refills sent to [facility] on [address].

## 2024-02-10 NOTE — Telephone Encounter (Signed)
Medication refill sent to pharmacy  

## 2024-02-11 ENCOUNTER — Other Ambulatory Visit (HOSPITAL_COMMUNITY)
Admission: RE | Admit: 2024-02-11 | Discharge: 2024-02-11 | Disposition: A | Discharge status: Home / Self Care | Source: Ambulatory Visit | Attending: Internal Medicine | Admitting: Internal Medicine

## 2024-02-11 DIAGNOSIS — N189 Chronic kidney disease, unspecified: Secondary | ICD-10-CM | POA: Insufficient documentation

## 2024-02-11 DIAGNOSIS — Z0001 Encounter for general adult medical examination with abnormal findings: Secondary | ICD-10-CM | POA: Diagnosis not present

## 2024-02-11 DIAGNOSIS — I509 Heart failure, unspecified: Secondary | ICD-10-CM | POA: Insufficient documentation

## 2024-02-11 DIAGNOSIS — Z1389 Encounter for screening for other disorder: Secondary | ICD-10-CM | POA: Diagnosis not present

## 2024-02-11 DIAGNOSIS — I5042 Chronic combined systolic (congestive) and diastolic (congestive) heart failure: Secondary | ICD-10-CM | POA: Diagnosis not present

## 2024-02-11 DIAGNOSIS — Z23 Encounter for immunization: Secondary | ICD-10-CM | POA: Diagnosis not present

## 2024-02-11 DIAGNOSIS — E1122 Type 2 diabetes mellitus with diabetic chronic kidney disease: Secondary | ICD-10-CM | POA: Insufficient documentation

## 2024-02-11 DIAGNOSIS — E785 Hyperlipidemia, unspecified: Secondary | ICD-10-CM | POA: Diagnosis not present

## 2024-02-11 DIAGNOSIS — I10 Essential (primary) hypertension: Secondary | ICD-10-CM | POA: Diagnosis not present

## 2024-02-11 DIAGNOSIS — I479 Paroxysmal tachycardia, unspecified: Secondary | ICD-10-CM | POA: Diagnosis not present

## 2024-02-11 DIAGNOSIS — I130 Hypertensive heart and chronic kidney disease with heart failure and stage 1 through stage 4 chronic kidney disease, or unspecified chronic kidney disease: Secondary | ICD-10-CM | POA: Diagnosis not present

## 2024-02-11 LAB — CBC WITH DIFFERENTIAL/PLATELET
Abs Immature Granulocytes: 0.03 10*3/uL (ref 0.00–0.07)
Basophils Absolute: 0.1 10*3/uL (ref 0.0–0.1)
Basophils Relative: 1 %
Eosinophils Absolute: 0.2 10*3/uL (ref 0.0–0.5)
Eosinophils Relative: 2 %
HCT: 42.5 % (ref 36.0–46.0)
Hemoglobin: 13.8 g/dL (ref 12.0–15.0)
Immature Granulocytes: 0 %
Lymphocytes Relative: 25 %
Lymphs Abs: 1.9 10*3/uL (ref 0.7–4.0)
MCH: 30.8 pg (ref 26.0–34.0)
MCHC: 32.5 g/dL (ref 30.0–36.0)
MCV: 94.9 fL (ref 80.0–100.0)
Monocytes Absolute: 0.6 10*3/uL (ref 0.1–1.0)
Monocytes Relative: 9 %
Neutro Abs: 4.6 10*3/uL (ref 1.7–7.7)
Neutrophils Relative %: 63 %
Platelets: 285 10*3/uL (ref 150–400)
RBC: 4.48 MIL/uL (ref 3.87–5.11)
RDW: 15.3 % (ref 11.5–15.5)
WBC: 7.3 10*3/uL (ref 4.0–10.5)
nRBC: 0 % (ref 0.0–0.2)

## 2024-02-11 LAB — BASIC METABOLIC PANEL
Anion gap: 12 (ref 5–15)
BUN: 25 mg/dL — ABNORMAL HIGH (ref 8–23)
CO2: 27 mmol/L (ref 22–32)
Calcium: 9.1 mg/dL (ref 8.9–10.3)
Chloride: 99 mmol/L (ref 98–111)
Creatinine, Ser: 1.19 mg/dL — ABNORMAL HIGH (ref 0.44–1.00)
GFR, Estimated: 52 mL/min — ABNORMAL LOW (ref >60)
Glucose, Bld: 80 mg/dL (ref 70–99)
Potassium: 4 mmol/L (ref 3.5–5.1)
Sodium: 138 mmol/L (ref 135–145)

## 2024-02-11 LAB — HEPATIC FUNCTION PANEL
ALT: 14 U/L (ref 0–44)
AST: 21 U/L (ref 15–41)
Albumin: 3.8 g/dL (ref 3.5–5.0)
Alkaline Phosphatase: 79 U/L (ref 38–126)
Bilirubin, Direct: 0.1 mg/dL (ref 0.0–0.2)
Indirect Bilirubin: 0.5 mg/dL (ref 0.3–0.9)
Total Bilirubin: 0.6 mg/dL (ref 0.0–1.2)
Total Protein: 7.9 g/dL (ref 6.5–8.1)

## 2024-02-11 LAB — LIPID PANEL
Cholesterol: 169 mg/dL (ref 0–200)
HDL: 64 mg/dL (ref >40)
LDL Cholesterol: 82 mg/dL (ref 0–99)
Total CHOL/HDL Ratio: 2.6 ratio
Triglycerides: 115 mg/dL (ref <150)
VLDL: 23 mg/dL (ref 0–40)

## 2024-02-12 LAB — MICROALBUMIN / CREATININE URINE RATIO
Creatinine, Urine: 65.2 mg/dL
Microalb Creat Ratio: 11 mg/g{creat} (ref 0–29)
Microalb, Ur: 6.9 ug/mL — ABNORMAL HIGH

## 2024-02-12 LAB — HEMOGLOBIN A1C
Hgb A1c MFr Bld: 6.4 % — ABNORMAL HIGH (ref 4.8–5.6)
Mean Plasma Glucose: 137 mg/dL

## 2024-02-15 ENCOUNTER — Telehealth: Payer: Self-pay | Admitting: Pulmonary Disease

## 2024-02-15 DIAGNOSIS — G4733 Obstructive sleep apnea (adult) (pediatric): Secondary | ICD-10-CM | POA: Diagnosis not present

## 2024-02-15 NOTE — Telephone Encounter (Signed)
 Call patient  Sleep study result  Date of study: 01/30/2024  Impression: Moderate obstructive sleep apnea with AHI of 17.5, O2 nadir of 76%  Recommendation: DME referral  Recommend CPAP therapy for moderate obstructive sleep apnea  Auto titrating CPAP with pressure settings of 5-15 will be appropriate  Encourage weight loss measures  Follow-up in the office 4 to 6 weeks following initiation of treatment

## 2024-02-17 NOTE — Telephone Encounter (Signed)
 ATC X1. LMTCB

## 2024-02-17 NOTE — Telephone Encounter (Signed)
 Please let patient know sleep study showed moderate OSA, if agreeable to starting CPAP place order for auto settings 5-15cm h2 and schedule follow-up in 31-90 days for compliance check in

## 2024-02-18 DIAGNOSIS — Z1211 Encounter for screening for malignant neoplasm of colon: Secondary | ICD-10-CM | POA: Diagnosis not present

## 2024-02-18 DIAGNOSIS — Z1212 Encounter for screening for malignant neoplasm of rectum: Secondary | ICD-10-CM | POA: Diagnosis not present

## 2024-02-18 NOTE — Telephone Encounter (Signed)
 I called and spoke with pt. Pt was informed of Beth's note. Pt would like to wait until upcoming appointment with Beth on 03-09-24 at 11:30 am to discuss things further before she starts anything. FYI to [employer].

## 2024-02-25 NOTE — Progress Notes (Signed)
 [facility] - Clinic Note  03/02/2024    CHIEF COMPLAINT Patient presents for Retina Follow Up   HISTORY OF PRESENT ILLNESS: Michelle Morrison is a 63 y.o. female who presents to the clinic today for:   HPI     Retina Follow Up   Patient presents with  CRVO/BRVO.  In left eye.  This started 6 weeks ago.  Duration of 6 weeks.  Since onset it is stable.  I, the attending physician,  performed the HPI with the patient and updated documentation appropriately.        Comments   6 week retina follow up BRVO OS and I'VE OS pt is reporting stable vision she denies any flashes or floaters       Last edited by [name], MD on 03/02/2024 12:36 PM.     Referring physician: [name], MD [address] [place],  [place] [address]  HISTORICAL INFORMATION:   Selected notes from the MEDICAL RECORD NUMBER Referred by Dr. [name]: 05.08.23 Ocular Hx- NPDR OS  Patient saw Dr. [name] Feb 2022.  Vision at that time was OD 20/20, OS 20/25    CURRENT MEDICATIONS: No current outpatient medications on file. (Ophthalmic Drugs)   No current facility-administered medications for this visit. (Ophthalmic Drugs)   Current Outpatient Medications (Other)  Medication Sig   albuterol (VENTOLIN HFA) 108 (90 Base) MCG/ACT inhaler Inhale 1-2 puffs into the lungs every 4 (four) hours as needed for wheezing or shortness of breath.   amiodarone (PACERONE) 200 MG tablet Take 1 tablet (200 mg total) by mouth daily. Monday - Saturday. Do not take on Sunday.   atorvastatin (LIPITOR) 80 MG tablet Take 1 tablet (80 mg total) by mouth daily.   dapagliflozin propanediol (FARXIGA) 10 MG TABS tablet TAKE 1 TABLET (10 MG TOTAL) BY MOUTH DAILY.   diclofenac Sodium (VOLTAREN) 1 % GEL Apply 4 g topically daily as needed (stomach pain).   furosemide (LASIX) 20 MG tablet TAKE 1 TABLET(20 MG) BY MOUTH DAILY. FOLLOW WITH CARDS FOR REFILL AND REPEAT LABS   hydrALAZINE (APRESOLINE) 25  MG tablet Take 1 tablet (25 mg total) by mouth 3 (three) times daily. Follow up with your PCP and cardiologist to follow up your blood pressure and for refills/to adjust dose as needed   isosorbide mononitrate (IMDUR) 30 MG 24 hr tablet Take 1 tablet (30 mg total) by mouth daily.   TRADJENTA 5 MG TABS tablet Take 5 mg by mouth daily.   No current facility-administered medications for this visit. (Other)   REVIEW OF SYSTEMS: ROS   Positive for: Endocrine, Cardiovascular, Eyes, Respiratory Negative for: Constitutional, Gastrointestinal, Neurological, Skin, Genitourinary, Musculoskeletal, HENT, Psychiatric, Allergic/Imm, Heme/Lymph Last edited by Etheleen Mayhew, COT on 03/02/2024  8:21 AM.     ALLERGIES No Known Allergies  PAST MEDICAL HISTORY Past Medical History:  Diagnosis Date   Atrial tachycardia (HCC)    CHF (congestive heart failure) (HCC)    a. EF 45% by echo in 03/2020 with normal cors by cath   Diabetes mellitus without complication (HCC)    Diarrhea    Heart disease    Herpes simplex infection    Hyperlipidemia    Hypertension    Past Surgical History:  Procedure Laterality Date   CESAREAN SECTION     CHOLECYSTECTOMY     COLONOSCOPY N/A 08/26/2014   Procedure: COLONOSCOPY;  Surgeon: West Bali, MD;  [facility]: [facility];  Service: Endoscopy;  Laterality: N/A;  9:30 AM - moved to 8:30 - Ginger to notify pt   LEFT HEART CATH AND CORONARY ANGIOGRAPHY N/A 04/14/2020   Procedure: LEFT HEART CATH AND CORONARY ANGIOGRAPHY;  Surgeon: Swaziland, [name], MD;  Location: [facility] INVASIVE CV LAB;  Service: Cardiovascular;  Laterality: N/A;   RIGHT/LEFT HEART CATH AND CORONARY ANGIOGRAPHY N/A 02/19/2021   Procedure: RIGHT/LEFT HEART CATH AND CORONARY ANGIOGRAPHY;  Surgeon: Laurey Morale, MD;  Location: [facility] INVASIVE CV LAB;  Service: Cardiovascular;  Laterality: N/A;   SVT ABLATION N/A 09/30/2023   Procedure: SVT ABLATION;  Surgeon: Marinus Maw, MD;  Location: [facility] INVASIVE CV  LAB;  Service: Cardiovascular;  Laterality: N/A;   FAMILY HISTORY Family History  Problem Relation Age of Onset   Hypertension Mother    Diabetes Mother    Colon cancer Neg Hx    SOCIAL HISTORY Social History   Tobacco Use   Smoking status: Never   Smokeless tobacco: Never  Vaping Use   Vaping status: Never Used  Substance Use Topics   Alcohol use: No    Alcohol/week: 0.0 standard drinks of alcohol   Drug use: No       OPHTHALMIC EXAM:  Base Eye Exam     Visual Acuity (Snellen - Linear)       Right Left   Dist [place] 20/25 20/60   Dist ph [employer]  20/40 -2         Tonometry (Tonopen, 8:29 AM)       Right Left   Pressure 18 18         Pupils       Pupils Dark Light Shape React APD   Right PERRL 2 1 Round Brisk None   Left PERRL 2 1 Round Brisk None         Visual Fields       Left Right    Full Full         Extraocular Movement       Right Left    Full, Ortho Full, Ortho         Neuro/Psych     Oriented x3: Yes   Mood/Affect: Normal         Dilation     Both eyes: 2.5% Phenylephrine @ 8:29 AM           Slit Lamp and Fundus Exam     Slit Lamp Exam       Right Left   Lids/Lashes Dermatochalasis - upper lid, Meibomian gland dysfunction Dermatochalasis - upper lid, Meibomian gland dysfunction   Conjunctiva/Sclera melanosis melanosis   Cornea trace PEE trace PEE   Anterior Chamber deep, narrow temporal angle deep, narrow temporal angle   Iris Round and dilated, No NVI Round and dilated, No NVI   Lens 2+ Nuclear sclerosis, 2+ Cortical cataract 2+ Nuclear sclerosis, 2+ Cortical cataract   Anterior Vitreous Vitreous syneresis Vitreous syneresis         Fundus Exam       Right Left   Disc Pink and sharp, PPP Pink and sharp, PPP   C/D Ratio 0.6 0.6   Macula Flat, Good foveal reflex, focal punctate MA temporal to fovea -- improved, no frank edema Blunted foveal reflex, central edema --slightly improved, punctate perifoveal  exudates, cluster of IRH superior macula -- improving   Vessels Attenuated, Tortuous Attenuated, old BRVO with macroaneurysm superior macula, mild tortuosity   Periphery Attached, rare MA Attached, rare MA  IMAGING AND PROCEDURES  Imaging and Procedures for 03/02/2024  OCT, Retina - OU - Both Eyes       Right Eye Quality was good. Central Foveal Thickness: 227. Progression has improved. Findings include normal foveal contour, no IRF, no SRF, intraretinal hyper-reflective material, vitreomacular adhesion (Focal cystic changes).   Left Eye Quality was good. Central Foveal Thickness: 259. Progression has improved. Findings include no SRF, abnormal foveal contour, subretinal hyper-reflective material, intraretinal hyper-reflective material, intraretinal fluid, vitreomacular adhesion (Interval improvement in IRF/IRHM superior fovea and macula, central ORA and SRHM).   Notes *Images captured and stored on drive  Diagnosis / Impression:  OD: NFP; Focal cystic changes  OS: BRVO w/ CME - Interval improvement in IRF/IRHM superior fovea and macula, central ORA and SRHM  Clinical management:  See below  Abbreviations: NFP - Normal foveal profile. CME - cystoid macular edema. PED - pigment epithelial detachment. IRF - intraretinal fluid. SRF - subretinal fluid. EZ - ellipsoid zone. ERM - epiretinal membrane. ORA - outer retinal atrophy. ORT - outer retinal tubulation. SRHM - subretinal hyper-reflective material. IRHM - intraretinal hyper-reflective material      Intravitreal Injection, Pharmacologic Agent - OS - Left Eye       Time Out 03/02/2024. 10:14 AM. Confirmed correct patient, procedure, site, and patient consented.   Anesthesia Topical anesthesia was used. Anesthetic medications included Lidocaine 2%, Proparacaine 0.5%.   Procedure Preparation included 5% betadine to ocular surface, eyelid speculum. A (32g) needle was used.   Injection: 6 mg faricimab-svoa 6  MG/0.05ML   Route: Intravitreal, Site: Left Eye   NDC: 16109-604-54, Lot: [id number], Expiration date: 03/24/2025, Waste: 0 mL   Post-op Post injection exam found visual acuity of at least counting fingers. The patient tolerated the procedure well. There were no complications. The patient received written and verbal post procedure care education. Post injection medications were not given.            ASSESSMENT/PLAN:    ICD-10-CM   1. Branch retinal vein occlusion of left eye with macular edema  H34.8320 OCT, Retina - OU - Both Eyes    Intravitreal Injection, Pharmacologic Agent - OS - Left Eye    faricimab-svoa (VABYSMO) 6mg /0.66mL intravitreal injection    2. Essential hypertension  I10     3. Hypertensive retinopathy of both eyes  H35.033     4. Mild nonproliferative diabetic retinopathy of both eyes without macular edema associated with type 2 diabetes mellitus (HCC)  N82.9562     5. Long term (current) use of oral hypoglycemic drugs  Z79.84     6. 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-related cataract of both eyes  H25.813      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, ?macroaneurysm - s/p IVA OS #1 (05.17.23), #2 (06.14.23), #3 (08.02.23), #4 (09.06.23), #5 (10.04.23), #6 (11.06.23) -- IVA resistance ============================ - s/p IVE OS #1 (12.08.23), #2 (01.17.24), #3 (03.14.24), #4 (04.25.24), #5 (06.07.24), #6 (07.29.24), #7 (09.09.24), #8 (10.22.24), #9 (12.03.24), #10 (01.14.25), #11 (02.25.25) **h/o increased fluid OS at 7 wks -- noted on 07.29.24 visit** - BCVA OS 20/40 from 20/50 - OCT OS: persistent IRF/IRHM superior fovea and macula, central ORA and SRHM at 6 weeks **discussed decreased efficacy / resistance to Eylea and potential benefit of switching medication* - recommend IVV OS #1 today, 04.08.25 w/ f/u in 6 wks - pt wishes to  proceed with  injection   - RBA of procedure discussed, questions answered - IVE informed consent obtained and signed, 01.14.25 (OS) - IVV informed consent obtained and signed 04.08.25 (OS) - see procedure note - F/U 6 weeks -- DFE/OCT/possible injection    2,3. Hypertensive retinopathy OU - discussed importance of tight BP control - monitor  4,5. Mild nonproliferative diabetic retinopathy w/o DME OD - exam shows rare MA OU  - FA (05.17.23) shows rare MA OU; no NV OU -- BRVO OS as above - OCT OD: NFP; Focal cystic changes and IRHM in temporal macula -- persistent - BCVA OD 20/25 - will continue to hold off on anti-VEGF therapy OD for now  - monitor  6. Mixed Cataract OU - The symptoms of cataract, surgical options, and treatments and risks were discussed with patient. - discussed diagnosis and progression - monitor  Ophthalmic Meds Ordered this visit:  Meds ordered this encounter  Medications   faricimab-svoa (VABYSMO) 6mg /0.56mL intravitreal injection     Return in about 6 weeks (around 04/13/2024) for f/u BRVO OS , DFE, OCT, Possible, IVV, OS.  There are no Patient Instructions on file for this visit.   This document serves as a record of services personally performed by Karie Chimera, MD, PhD. It was created on their behalf by Charlette Caffey, COT an ophthalmic technician. The creation of this record is the provider's dictation and/or activities during the visit.    Electronically signed by:  Charlette Caffey, COT  03/02/24 12:37 PM  Karie Chimera, M.D., Ph.D. Diseases & Surgery of the Retina and Vitreous [facility]  I have reviewed the above documentation for accuracy and completeness, and I agree with the above. Karie Chimera, M.D., Ph.D. 03/02/24 12:39 PM    Abbreviations: M myopia (nearsighted); A astigmatism; H hyperopia (farsighted); P presbyopia; Mrx spectacle prescription;  CTL contact lenses; OD right eye; OS left eye; OU both eyes  XT  exotropia; ET esotropia; PEK punctate epithelial keratitis; PEE punctate epithelial erosions; DES dry eye syndrome; MGD meibomian gland dysfunction; ATs artificial tears; PFAT's preservative free artificial tears; NSC nuclear sclerotic cataract; PSC posterior subcapsular cataract; ERM epi-retinal membrane; PVD posterior vitreous detachment; RD retinal detachment; DM diabetes mellitus; DR diabetic retinopathy; NPDR non-proliferative diabetic retinopathy; PDR proliferative diabetic retinopathy; CSME clinically significant macular edema; DME diabetic macular edema; dbh dot blot hemorrhages; CWS cotton wool spot; POAG primary open angle glaucoma; C/D cup-to-disc ratio; HVF humphrey visual field; GVF goldmann visual field; OCT optical coherence tomography; IOP intraocular pressure; BRVO Branch retinal vein occlusion; CRVO central retinal vein occlusion; CRAO central retinal artery occlusion; BRAO branch retinal artery occlusion; RT retinal tear; SB scleral buckle; PPV pars plana vitrectomy; VH Vitreous hemorrhage; PRP panretinal laser photocoagulation; IVK intravitreal kenalog; VMT vitreomacular traction; MH Macular hole;  NVD neovascularization of the disc; NVE neovascularization elsewhere; AREDS age related eye disease study; ARMD age related macular degeneration; POAG primary open angle glaucoma; EBMD epithelial/anterior basement membrane dystrophy; ACIOL anterior chamber intraocular lens; IOL intraocular lens; PCIOL posterior chamber intraocular lens; Phaco/IOL phacoemulsification with intraocular lens placement; PRK photorefractive keratectomy; LASIK laser assisted in situ keratomileusis; HTN hypertension; DM diabetes mellitus; COPD chronic obstructive pulmonary disease

## 2024-02-26 ENCOUNTER — Other Ambulatory Visit (HOSPITAL_COMMUNITY)

## 2024-03-02 ENCOUNTER — Encounter (INDEPENDENT_AMBULATORY_CARE_PROVIDER_SITE_OTHER): Payer: Self-pay | Admitting: Ophthalmology

## 2024-03-02 ENCOUNTER — Ambulatory Visit (INDEPENDENT_AMBULATORY_CARE_PROVIDER_SITE_OTHER): Payer: 59 | Admitting: Ophthalmology

## 2024-03-02 DIAGNOSIS — H25813 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-related cataract, bilateral: Secondary | ICD-10-CM | POA: Diagnosis not present

## 2024-03-02 DIAGNOSIS — Z7984 Long term (current) use of oral hypoglycemic drugs: Secondary | ICD-10-CM

## 2024-03-02 DIAGNOSIS — H35033 Hypertensive retinopathy, bilateral: Secondary | ICD-10-CM | POA: Diagnosis not present

## 2024-03-02 DIAGNOSIS — H34832 Tributary (branch) retinal vein occlusion, left eye, with macular edema: Secondary | ICD-10-CM | POA: Diagnosis not present

## 2024-03-02 DIAGNOSIS — I10 Essential (primary) hypertension: Secondary | ICD-10-CM

## 2024-03-02 DIAGNOSIS — E113293 Type 2 diabetes mellitus with mild nonproliferative diabetic retinopathy without macular edema, bilateral: Secondary | ICD-10-CM

## 2024-03-02 MED ORDER — FARICIMAB-SVOA 6 MG/0.05ML IZ SOSY
6.0000 mg | PREFILLED_SYRINGE | INTRAVITREAL | Status: AC | PRN
  Administered 2024-03-02: 6 mg via INTRAVITREAL

## 2024-03-09 ENCOUNTER — Telehealth: Payer: Self-pay | Admitting: Primary Care

## 2024-03-09 ENCOUNTER — Ambulatory Visit: Payer: 59 | Admitting: Primary Care

## 2024-03-09 NOTE — Telephone Encounter (Signed)
 Spoke with patient to reschedule the 03/09/24 11:30 am visit with Marien Short, NP (provider out sick)--patient is scheduled for a My Chart visit on 04/06/24 at 11:30am--will mail iinformation to patient and she voiced her understanding

## 2024-03-13 DIAGNOSIS — I10 Essential (primary) hypertension: Secondary | ICD-10-CM | POA: Diagnosis not present

## 2024-03-13 DIAGNOSIS — I5042 Chronic combined systolic (congestive) and diastolic (congestive) heart failure: Secondary | ICD-10-CM | POA: Diagnosis not present

## 2024-03-29 ENCOUNTER — Ambulatory Visit: Attending: Internal Medicine | Admitting: Internal Medicine

## 2024-03-29 ENCOUNTER — Encounter: Payer: Self-pay | Admitting: Internal Medicine

## 2024-03-29 VITALS — BP 164/80 | HR 55 | Ht 66.0 in | Wt 179.2 lb

## 2024-03-29 DIAGNOSIS — I471 Supraventricular tachycardia, unspecified: Secondary | ICD-10-CM | POA: Diagnosis not present

## 2024-03-29 MED ORDER — AMIODARONE HCL 200 MG PO TABS: ORAL_TABLET | ORAL | 3 refills | Status: AC

## 2024-03-29 MED ORDER — ISOSORBIDE MONONITRATE ER 30 MG PO TB24: 45.0000 mg | ORAL_TABLET | Freq: Every day | ORAL | 3 refills | Status: AC

## 2024-03-29 NOTE — Patient Instructions (Signed)
 Medication Instructions:   DECREASE Amiodarone  to 200 mg Monday thru Friday, NONE on SATURDAY OR SUNDAY    INCREASE Imdur  to 45 mg daily, take 1 1/2 tablets daily  Labwork: None today  Testing/Procedures: None today  Follow-Up: 1 year  Any Other Special Instructions Will Be Listed Below (If Applicable).  If you need a refill on your cardiac medications before your next appointment, please call your pharmacy.

## 2024-03-29 NOTE — Progress Notes (Signed)
 HPI Michelle Morrison returns today for followup. She is a pleasant 63 yo woman with a long RP tachy and unusual AVNRT. We were able to slow her tach with ablation of the slow pathway but she remained inducible. Mapping of the left side of Koch's triangle was carried out but a His potential was visualized and the decision made not to proceed with ablation out of concern for the development of CHB. She returns today for followup. In the interim she notes no palpitations or symptoms of heart racing since starting amiodarone . Her EF at her echo several weeks ago shows mild LV dysfunction. She notes that her bp is better at home. No Known Allergies   Current Outpatient Medications  Medication Sig Dispense Refill   albuterol  (VENTOLIN  HFA) 108 (90 Base) MCG/ACT inhaler Inhale 1-2 puffs into the lungs every 4 (four) hours as needed for wheezing or shortness of breath.     amiodarone  (PACERONE ) 200 MG tablet Take 1 tablet (200 mg total) by mouth daily. Monday - Saturday. Do not take on Sunday. 90 tablet 3   atorvastatin  (LIPITOR ) 80 MG tablet Take 1 tablet (80 mg total) by mouth daily. 90 tablet 3   dapagliflozin  propanediol (FARXIGA ) 10 MG TABS tablet TAKE 1 TABLET (10 MG TOTAL) BY MOUTH DAILY. 30 tablet 6   diclofenac Sodium (VOLTAREN) 1 % GEL Apply 4 g topically daily as needed (stomach pain).     furosemide  (LASIX ) 20 MG tablet TAKE 1 TABLET(20 MG) BY MOUTH DAILY. FOLLOW WITH CARDS FOR REFILL AND REPEAT LABS 30 tablet 2   hydrALAZINE  (APRESOLINE ) 25 MG tablet Take 1 tablet (25 mg total) by mouth 3 (three) times daily. Follow up with your PCP and cardiologist to follow up your blood pressure and for refills/to adjust dose as needed 90 tablet 1   isosorbide  mononitrate (IMDUR ) 30 MG 24 hr tablet Take 1 tablet (30 mg total) by mouth daily. 90 tablet 3   TRADJENTA  5 MG TABS tablet Take 5 mg by mouth daily.     No current facility-administered medications for this visit.     Past Medical History:   Diagnosis Date   Atrial tachycardia (HCC)    CHF (congestive heart failure) (HCC)    a. EF 45% by echo in 03/2020 with normal cors by cath   Diabetes mellitus without complication (HCC)    Diarrhea    Heart disease    Herpes simplex infection    Hyperlipidemia    Hypertension     ROS:   All systems reviewed and negative except as noted in the HPI.   Past Surgical History:  Procedure Laterality Date   CESAREAN SECTION     CHOLECYSTECTOMY     COLONOSCOPY N/A 08/26/2014   Procedure: COLONOSCOPY;  Surgeon: Alyce Jubilee, MD;  [facility]: [facility];  Service: Endoscopy;  Laterality: N/A;  9:30 AM - moved to 8:30 - Ginger to notify pt   LEFT HEART CATH AND CORONARY ANGIOGRAPHY N/A 04/14/2020   Procedure: LEFT HEART CATH AND CORONARY ANGIOGRAPHY;  Surgeon: Swaziland, [name], MD;  Location: [facility] INVASIVE CV LAB;  Service: Cardiovascular;  Laterality: N/A;   RIGHT/LEFT HEART CATH AND CORONARY ANGIOGRAPHY N/A 02/19/2021   Procedure: RIGHT/LEFT HEART CATH AND CORONARY ANGIOGRAPHY;  Surgeon: Darlis Eisenmenger, MD;  Location: [facility] INVASIVE CV LAB;  Service: Cardiovascular;  Laterality: N/A;   SVT ABLATION N/A 09/30/2023   Procedure: SVT ABLATION;  Surgeon: Tammie Fall, MD;  Location: [facility] INVASIVE  CV LAB;  Service: Cardiovascular;  Laterality: N/A;     Family History  Problem Relation Age of Onset   Hypertension Mother    Diabetes Mother    Colon cancer Neg Hx      Social History   Socioeconomic History   Marital status: Single    Spouse name: Not on file   Number of children: Not on file   Years of education: Not on file   Highest education level: Not on file  Occupational History   Not on file  Tobacco Use   Smoking status: Never   Smokeless tobacco: Never  Vaping Use   Vaping status: Never Used  Substance and Sexual Activity   Alcohol  use: No    Alcohol /week: 0.0 standard drinks of alcohol    Drug use: No   Sexual activity: Not Currently    Birth control/protection:  Post-menopausal, Abstinence  Other Topics Concern   Not on file  Social History Narrative   Not on file   Social Drivers of Health   Financial Resource Strain: Low Risk  (02/27/2021)   Overall Financial Resource Strain (CARDIA)    Difficulty of Paying Living Expenses: Not hard at all  Recent Concern: Financial Resource Strain - Medium Risk (02/19/2021)   Overall Financial Resource Strain (CARDIA)    Difficulty of Paying Living Expenses: Somewhat hard  Food Insecurity: No Food Insecurity (09/09/2023)   Hunger Vital Sign    Worried About Running Out of Food in the Last Year: Never true    Ran Out of Food in the Last Year: Never true  Transportation Needs: No Transportation Needs (09/09/2023)   PRAPARE - Administrator, Civil Service (Medical): No    Lack of Transportation (Non-Medical): No  Physical Activity: Sufficiently Active (02/27/2021)   Exercise Vital Sign    Days of Exercise per Week: 7 days    Minutes of Exercise per Session: 30 min  Stress: No Stress Concern Present (02/27/2021)   [employer] of Occupational Health - Occupational Stress Questionnaire    Feeling of Stress : Not at all  Social Connections: Unknown (02/27/2021)   Social Connection and Isolation Panel [NHANES]    Frequency of Communication with Friends and Family: More than three times a week    Frequency of Social Gatherings with Friends and Family: Once a week    Attends Religious Services: 1 to 4 times per year    Active Member of [employer] or Organizations: No    Attends Banker Meetings: Never    Marital Status: Patient declined  Catering manager Violence: Not At Risk (09/09/2023)   Humiliation, Afraid, Rape, and Kick questionnaire    Fear of Current or Ex-Partner: No    Emotionally Abused: No    Physically Abused: No    Sexually Abused: No     BP (!) 164/80   Pulse (!) 55   Ht 5\' 6"  (1.676 m)   Wt 179 lb 3.2 oz (81.3 kg)   LMP 05/23/2007   SpO2 96%   BMI 28.92 kg/m    Physical Exam:  Well appearing NAD HEENT: Unremarkable Neck:  No JVD, no thyromegally Lymphatics:  No adenopathy Back:  No CVA tenderness Lungs:  Clear with no wheezes HEART:  Regular rate rhythm, no murmurs, no rubs, no clicks Abd:  soft, positive bowel sounds, no organomegally, no rebound, no guarding Ext:  2 plus pulses, no edema, no cyanosis, no clubbing Skin:  No rashes no nodules Neuro:  CN II through  XII intact, motor grossly intact  Assess/Plan: SVT - She has not had any more SVT. I have asked her to reduce the amio to 200 mg daily, Mon-Fri, none on Sat/Sun. Chronic systolic heart failure - she has had near normalization of her LV function. Continue medical therapy. HTN -her bp is up today. I have asked her to increase Imdur  to 45 mg daily.  Obesity - She is encouraged to lose weight.   Pete Brand Conway Fedora,MD

## 2024-04-01 NOTE — Progress Notes (Signed)
 [facility] - Clinic Note  04/13/2024    CHIEF COMPLAINT Patient presents for Retina Follow Up   HISTORY OF PRESENT ILLNESS: Michelle Morrison is a 63 y.o. female who presents to the clinic today for:   HPI     Retina Follow Up   Patient presents with  CRVO/BRVO.  In left eye.  This started 6 weeks ago.  I, the attending physician,  performed the HPI with the patient and updated documentation appropriately.        Comments   Patient here for 6 weeks retina follow up for BRVO OS. Patient states vision doing better. No eye pain.       Last edited by Ronelle Coffee, MD on 04/13/2024  4:52 PM.    Pt feels like vision is improved  Referring physician: Wyvonna Heidelberg, MD [address] [place],  [place] [address]  HISTORICAL INFORMATION:   Selected notes from the MEDICAL RECORD NUMBER Referred by Dr. Barbra Boone LEE: 05.08.23 Ocular Hx- NPDR OS  Patient saw Dr. Mason Sole Feb 2022.  Vision at that time was OD 20/20, OS 20/25    CURRENT MEDICATIONS: No current outpatient medications on file. (Ophthalmic Drugs)   No current facility-administered medications for this visit. (Ophthalmic Drugs)   Current Outpatient Medications (Other)  Medication Sig   albuterol  (VENTOLIN  HFA) 108 (90 Base) MCG/ACT inhaler Inhale 1-2 puffs into the lungs every 4 (four) hours as needed for wheezing or shortness of breath.   amiodarone  (PACERONE ) 200 MG tablet Take 1 tablet (200 mg total) Monday thru Friday NONE on Saturday and Sunday   atorvastatin  (LIPITOR ) 80 MG tablet Take 1 tablet (80 mg total) by mouth daily.   diclofenac Sodium (VOLTAREN) 1 % GEL Apply 4 g topically daily as needed (stomach pain). (Patient not taking: Reported on 04/15/2024)   isosorbide  mononitrate (IMDUR ) 30 MG 24 hr tablet Take 1.5 tablets (45 mg total) by mouth daily.   TRADJENTA  5 MG TABS tablet Take 5 mg by mouth daily.   dapagliflozin  propanediol (FARXIGA ) 10 MG TABS tablet TAKE 1 TABLET (10 MG  TOTAL) BY MOUTH DAILY.   ENTRESTO  97-103 MG Take 1 tablet by mouth 2 (two) times daily.   furosemide  (LASIX ) 20 MG tablet Take 1 tablet (20 mg total) by mouth daily.   hydrALAZINE  (APRESOLINE ) 50 MG tablet Take 1 tablet (50 mg total) by mouth 3 (three) times daily.   No current facility-administered medications for this visit. (Other)   REVIEW OF SYSTEMS: ROS   Positive for: Endocrine, Cardiovascular, Eyes, Respiratory Negative for: Constitutional, Gastrointestinal, Neurological, Skin, Genitourinary, Musculoskeletal, HENT, Psychiatric, Allergic/Imm, Heme/Lymph Last edited by Sylvan Evener, COA on 04/13/2024  8:33 AM.      ALLERGIES No Known Allergies  PAST MEDICAL HISTORY Past Medical History:  Diagnosis Date   Atrial tachycardia (HCC)    CHF (congestive heart failure) (HCC)    a. EF 45% by echo in 03/2020 with normal cors by cath   Diabetes mellitus without complication (HCC)    Diarrhea    Heart disease    Herpes simplex infection    Hyperlipidemia    Hypertension    Past Surgical History:  Procedure Laterality Date   CESAREAN SECTION     CHOLECYSTECTOMY     COLONOSCOPY N/A 08/26/2014   Procedure: COLONOSCOPY;  Surgeon: Alyce Jubilee, MD;  Location: [facility];  Service: Endoscopy;  Laterality: N/A;  9:30 AM - moved to 8:30 - Ginger to notify pt  LEFT HEART CATH AND CORONARY ANGIOGRAPHY N/A 04/14/2020   Procedure: LEFT HEART CATH AND CORONARY ANGIOGRAPHY;  Surgeon: Swaziland, [name], MD;  Location[facility] INVASIVE CV LAB;  Service: Cardiovascular;  Laterality: N/A;   RIGHT/LEFT HEART CATH AND CORONARY ANGIOGRAPHY N/A 02/19/2021   Procedure: RIGHT/LEFT HEART CATH AND CORONARY ANGIOGRAPHY;  Surgeon: Darlis Eisenmenger, MD;  Location: [facility] INVASIVE CV LAB;  Service: Cardiovascular;  Laterality: N/A;   SVT ABLATION N/A 09/30/2023   Procedure: SVT ABLATION;  Surgeon: Tammie Fall, MD;  Location: [facility] INVASIVE CV LAB;  Service: Cardiovascular;  Laterality: N/A;   FAMILY  HISTORY Family History  Problem Relation Age of Onset   Hypertension Mother    Diabetes Mother    Colon cancer Neg Hx    SOCIAL HISTORY Social History   Tobacco Use   Smoking status: Never   Smokeless tobacco: Never  Vaping Use   Vaping status: Never Used  Substance Use Topics   Alcohol  use: No    Alcohol /week: 0.0 standard drinks of alcohol    Drug use: No       OPHTHALMIC EXAM:  Base Eye Exam     Visual Acuity (Snellen - Linear)       Right Left   Dist [place] 20/20 -1 20/60 -1   Dist ph [place]  20/40 -2         Tonometry (Tonopen, 8:32 AM)       Right Left   Pressure 18 16         Pupils       Dark Light Shape React APD   Right 2 1 Round Brisk None   Left 2 1 Round Brisk None         Visual Fields (Counting fingers)       Left Right    Full Full         Extraocular Movement       Right Left    Full, Ortho Full, Ortho         Neuro/Psych     Oriented x3: Yes   Mood/Affect: Normal         Dilation     Both eyes: 1.0% Mydriacyl, 2.5% Phenylephrine @ 8:31 AM           Slit Lamp and Fundus Exam     Slit Lamp Exam       Right Left   Lids/Lashes Dermatochalasis - upper lid, Meibomian gland dysfunction Dermatochalasis - upper lid, Meibomian gland dysfunction   Conjunctiva/Sclera melanosis melanosis   Cornea trace PEE trace PEE   Anterior Chamber deep, narrow temporal angle deep, narrow temporal angle   Iris Round and dilated, No NVI Round and dilated, No NVI   Lens 2+ Nuclear sclerosis, 2+ Cortical cataract 2+ Nuclear sclerosis, 2+ Cortical cataract   Anterior Vitreous Vitreous syneresis Vitreous syneresis         Fundus Exam       Right Left   Disc Pink and sharp, PPP Pink and sharp, PPP   C/D Ratio 0.6 0.6   Macula Flat, Good foveal reflex, focal punctate MA temporal to fovea, no frank edema Blunted foveal reflex, central edema -- slightly improved, punctate perifoveal exudates, cluster of IRH superior macula -- improving    Vessels Attenuated, Tortuous Attenuated, old BRVO with macroaneurysm superior macula, mild tortuosity   Periphery Attached, rare MA Attached, rare MA           IMAGING AND PROCEDURES  Imaging and Procedures for 04/13/2024  OCT, Retina - OU - Both Eyes        Right Eye Quality was good. Central Foveal Thickness: 229. Progression has been stable. Findings include normal foveal contour, no IRF, no SRF, intraretinal hyper-reflective material, vitreomacular adhesion (Focal cystic changes).   Left Eye Quality was good. Central Foveal Thickness: 249. Progression has improved. Findings include no SRF, abnormal foveal contour, subretinal hyper-reflective material, intraretinal hyper-reflective material, intraretinal fluid, vitreomacular adhesion (Mild interval improvement in IRF/IRHM superior fovea and macula, central ORA and SRHM).   Notes  *Images captured and stored on drive  Diagnosis / Impression:  OD: NFP; Focal cystic changes  OS: BRVO w/ CME - mild interval improvement in IRF/IRHM superior fovea and macula, central ORA and SRHM  Clinical management:  See below  Abbreviations: NFP - Normal foveal profile. CME - cystoid macular edema. PED - pigment epithelial detachment. IRF - intraretinal fluid. SRF - subretinal fluid. EZ - ellipsoid zone. ERM - epiretinal membrane. ORA - outer retinal atrophy. ORT - outer retinal tubulation. SRHM - subretinal hyper-reflective material. IRHM - intraretinal hyper-reflective material      Intravitreal Injection, Pharmacologic Agent - OS - Left Eye       Time Out 04/13/2024. 9:40 AM. Confirmed correct patient, procedure, site, and patient consented.   Anesthesia Topical anesthesia was used. Anesthetic medications included Lidocaine  2%, Proparacaine 0.5%.   Procedure Preparation included 5% betadine to ocular surface, eyelid speculum. A (32g) needle was used.   Injection: 6 mg faricimab -svoa 6 MG/0.05ML   Route: Intravitreal, Site: Left  Eye   NDC: 16109-604-54, Lot: [id number], Expiration date: 04/24/2025, Waste: 0 mL   Post-op Post injection exam found visual acuity of at least counting fingers. The patient tolerated the procedure well. There were no complications. The patient received written and verbal post procedure care education. Post injection medications were not given.            ASSESSMENT/PLAN:    ICD-10-CM   1. Branch retinal vein occlusion of left eye with macular edema  H34.8320 OCT, Retina - OU - Both Eyes    Intravitreal Injection, Pharmacologic Agent - OS - Left Eye    faricimab -svoa (VABYSMO ) 6mg /0.69mL intravitreal injection    2. Essential hypertension  I10     3. Hypertensive retinopathy of both eyes  H35.033     4. Mild nonproliferative diabetic retinopathy of both eyes without macular edema associated with type 2 diabetes mellitus (HCC)  N82.9562     5. Long term (current) use of oral hypoglycemic drugs  Z79.84     6. 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-related cataract of both eyes  H25.813      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, ?macroaneurysm - s/p IVA OS #1 (05.17.23), #2 (06.14.23), #3 (08.02.23), #4 (09.06.23), #5 (10.04.23), #6 (11.06.23) -- IVA resistance - s/p IVE OS #1 (12.08.23), #2 (01.17.24), #3 (03.14.24), #4 (04.25.24), #5 (06.07.24), #6 (07.29.24), #7 (09.09.24), #8 (10.22.24), #9 (12.03.24), #10 (01.14.25), #11 (02.25.25) -- IVE resistance ============================ - s/p IVV OS #1 (04.08.25) **h/o increased fluid OS at 7 wks -- noted on 07.29.24 visit** - BCVA OS 20/40  - OCT OS: mild interval improvement in IRF/IRHM superior fovea and macula, central ORA and SRHM at 6 weeks - recommend IVV OS #2 today, 05.20.25 w/ f/u in 6 wks - pt wishes to proceed with injection   - RBA  of procedure discussed, questions answered - IVV informed consent obtained and  signed 04.08.25 (OS) - see procedure note - F/U 6 weeks -- DFE/OCT/possible injection   2,3. Hypertensive retinopathy OU - discussed importance of tight BP control - monitor  4,5. Mild nonproliferative diabetic retinopathy w/o DME OD - exam shows rare MA OU  - FA (05.17.23) shows rare MA OU; no NV OU -- BRVO OS as above - OCT OD: NFP; Focal cystic changes and IRHM in temporal macula -- persistent - BCVA OD 20/25 - will continue to hold off on anti-VEGF therapy OD for now  - monitor  6. Mixed Cataract OU - The symptoms of cataract, surgical options, and treatments and risks were discussed with patient. - discussed diagnosis and progression - monitor  Ophthalmic Meds Ordered this visit:  Meds ordered this encounter  Medications   faricimab -svoa (VABYSMO ) 6mg /0.1mL intravitreal injection     Return in about 6 weeks (around 05/25/2024) for f/u BRVO OS, DFE, OCT, Possible Injxn.  There are no Patient Instructions on file for this visit.  This document serves as a record of services personally performed by Jeanice Millard, MD, PhD. It was created on their behalf by Olene Berne, COT an ophthalmic technician. The creation of this record is the provider's dictation and/or activities during the visit.    Electronically signed by:  Olene Berne, COT  04/18/24 1:29 AM  This document serves as a record of services personally performed by Jeanice Millard, MD, PhD. It was created on their behalf by Morley Arabia. Bevin Bucks, OA an ophthalmic technician. The creation of this record is the provider's dictation and/or activities during the visit.    Electronically signed by: Morley Arabia. Bevin Bucks, OA 04/18/24 1:29 AM  Jeanice Millard, M.D., Ph.D. Diseases & Surgery of the Retina and Vitreous [facility]  I have reviewed the above documentation for accuracy and completeness, and I agree with the above. Jeanice Millard, M.D., Ph.D. 04/18/24 1:31 AM   Abbreviations: M  myopia (nearsighted); A astigmatism; H hyperopia (farsighted); P presbyopia; Mrx spectacle prescription;  CTL contact lenses; OD right eye; OS left eye; OU both eyes  XT exotropia; ET esotropia; PEK punctate epithelial keratitis; PEE punctate epithelial erosions; DES dry eye syndrome; MGD meibomian gland dysfunction; ATs artificial tears; PFAT's preservative free artificial tears; NSC nuclear sclerotic cataract; PSC posterior subcapsular cataract; ERM epi-retinal membrane; PVD posterior vitreous detachment; RD retinal detachment; DM diabetes mellitus; DR diabetic retinopathy; NPDR non-proliferative diabetic retinopathy; PDR proliferative diabetic retinopathy; CSME clinically significant macular edema; DME diabetic macular edema; dbh dot blot hemorrhages; CWS cotton wool spot; POAG primary open angle glaucoma; C/D cup-to-disc ratio; HVF humphrey visual field; GVF goldmann visual field; OCT optical coherence tomography; IOP intraocular pressure; BRVO Branch retinal vein occlusion; CRVO central retinal vein occlusion; CRAO central retinal artery occlusion; BRAO branch retinal artery occlusion; RT retinal tear; SB scleral buckle; PPV pars plana vitrectomy; VH Vitreous hemorrhage; PRP panretinal laser photocoagulation; IVK intravitreal kenalog; VMT vitreomacular traction; MH Macular hole;  NVD neovascularization of the disc; NVE neovascularization elsewhere; AREDS age related eye disease study; ARMD age related macular degeneration; POAG primary open angle glaucoma; EBMD epithelial/anterior basement membrane dystrophy; ACIOL anterior chamber intraocular lens; IOL intraocular lens; PCIOL posterior chamber intraocular lens; Phaco/IOL phacoemulsification with intraocular lens placement; PRK photorefractive keratectomy; LASIK laser assisted in situ keratomileusis; HTN hypertension; DM diabetes mellitus; COPD chronic obstructive pulmonary disease

## 2024-04-06 ENCOUNTER — Encounter: Payer: Self-pay | Admitting: Primary Care

## 2024-04-06 ENCOUNTER — Ambulatory Visit (INDEPENDENT_AMBULATORY_CARE_PROVIDER_SITE_OTHER): Admitting: Primary Care

## 2024-04-06 VITALS — BP 144/72 | HR 78 | Temp 97.3°F | Wt 180.0 lb

## 2024-04-06 DIAGNOSIS — G4733 Obstructive sleep apnea (adult) (pediatric): Secondary | ICD-10-CM | POA: Diagnosis not present

## 2024-04-06 NOTE — Progress Notes (Signed)
 @Patient  ID: Michelle Morrison, female    DOB: 06-Apr-1961, 63 y.o.   MRN: [id number]  Chief Complaint  Patient presents with   Follow-up    Hst results     Referring provider: Wyvonna Heidelberg*  HPI: 63 year old female, never smoked. PMH significant for CHF, HTN, aflutter, type 2 diabetes, CKD.   Previous [facility] pulmonary encounter:  01/12/2024 Discussed the use of AI scribe software for clinical note transcription with the patient, who gave verbal consent to proceed.  History of Present Illness   Michelle Morrison is a 63 year old female with atrial fibrillation, supraventricular tachycardia, heart failure, and hypertension who presents for a sleep consult. She was referred by her cardiologist, for evaluation of potential sleep apnea.  She occasionally wakes up gasping or catching her breath. She has never been told she snores, as she sleeps alone. She does not have a set bedtime, typically going to bed between 10 PM and midnight, and does not have difficulty falling asleep. She wakes up once or twice a night to use the restroom, which she attributes to her diuretic medication. She usually gets up for the day around 7 AM.  Her cardiac history includes atrial fibrillation, supraventricular tachycardia, heart failure, and hypertension. She underwent a cardiac ablation in November, which has since resolved her arrhythmia symptoms. She is under the care of Dr. Armida Lander and Dr. Jalene Mayor for her heart conditions.  She continues to take her prescribed medications, including Lasix  20 mg once daily in the morning.  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 Epworth score 2/24. No concern for narcolepsy, cataplexy or sleep walking.       04/06/2024- Interim hx  Discussed the use of AI scribe software for clinical note transcription with the patient, who gave verbal consent to proceed.  History of Present Illness   Michelle Morrison is a 63 year old  female with atrial fibrillation who presents for a follow-up regarding sleep apnea. She was referred by her cardiologist for evaluation of sleep apnea due to its potential impact on her cardiac health.  She has a history of atrial fibrillation and hypertension. A sleep study conducted in March 2025 revealed moderate obstructive sleep apnea, with an average of seventeen apneic events per hour.  She acknowledges having been told she snores and occasionally wakes herself up at night. No frequent snoring or daytime sleepiness. She does not report feeling tired during the day and denies falling asleep unexpectedly. She describes her sleep as generally restful, with minimal tossing and turning, except when engaged in activities like cleaning or working.  She is not currently using any specific treatment for sleep apnea.      Sleep testing 01/30/24 HST>> Moderate OSA, AHI 17.5/hour with SpO2 low 76%   No Known Allergies  Immunization History  Administered Date(s) Administered   Influenza-Unspecified 11/25/2018   Moderna Sars-Covid-2 Vaccination 02/17/2020, 03/16/2020    Past Medical History:  Diagnosis Date   Atrial tachycardia (HCC)    CHF (congestive heart failure) (HCC)    a. EF 45% by echo in 03/2020 with normal cors by cath   Diabetes mellitus without complication (HCC)    Diarrhea    Heart disease    Herpes simplex infection    Hyperlipidemia    Hypertension     Tobacco History: Social History   Tobacco Use  Smoking Status Never  Smokeless Tobacco Never   Counseling given: Not Answered  Outpatient Medications Prior to Visit  Medication Sig Dispense Refill   albuterol  (VENTOLIN  HFA) 108 (90 Base) MCG/ACT inhaler Inhale 1-2 puffs into the lungs every 4 (four) hours as needed for wheezing or shortness of breath.     amiodarone  (PACERONE ) 200 MG tablet Take 1 tablet (200 mg total) Monday thru Friday NONE on Saturday and Sunday 90 tablet 3   atorvastatin  (LIPITOR ) 80 MG tablet  Take 1 tablet (80 mg total) by mouth daily. 90 tablet 3   dapagliflozin  propanediol (FARXIGA ) 10 MG TABS tablet TAKE 1 TABLET (10 MG TOTAL) BY MOUTH DAILY. 30 tablet 6   diclofenac Sodium (VOLTAREN) 1 % GEL Apply 4 g topically daily as needed (stomach pain).     furosemide  (LASIX ) 20 MG tablet TAKE 1 TABLET(20 MG) BY MOUTH DAILY. FOLLOW WITH CARDS FOR REFILL AND REPEAT LABS 30 tablet 2   isosorbide  mononitrate (IMDUR ) 30 MG 24 hr tablet Take 1.5 tablets (45 mg total) by mouth daily. 135 tablet 3   TRADJENTA  5 MG TABS tablet Take 5 mg by mouth daily.     hydrALAZINE  (APRESOLINE ) 25 MG tablet Take 1 tablet (25 mg total) by mouth 3 (three) times daily. Follow up with your PCP and cardiologist to follow up your blood pressure and for refills/to adjust dose as needed 90 tablet 1   No facility-administered medications prior to visit.   Review of Systems  Review of Systems  Constitutional: Negative.  Negative for fatigue.  HENT: Negative.    Respiratory: Negative.    Psychiatric/Behavioral:  Negative for sleep disturbance.    Physical Exam  BP (!) 144/72 (BP Location: Left Arm, Patient Position: Sitting, Cuff Size: Large)   Pulse 78   Temp (!) 97.3 F (36.3 C) (Temporal)   Wt 180 lb (81.6 kg)   LMP 05/23/2007   SpO2 97%   BMI 29.05 kg/m  Physical Exam Constitutional:      Appearance: Normal appearance. She is not ill-appearing.  HENT:     Head: Normocephalic and atraumatic.  Cardiovascular:     Rate and Rhythm: Normal rate and regular rhythm.  Pulmonary:     Effort: Pulmonary effort is normal.     Breath sounds: Normal breath sounds.  Skin:    General: Skin is warm and dry.  Neurological:     General: No focal deficit present.     Mental Status: She is alert and oriented to person, place, and time. Mental status is at baseline.  Psychiatric:        Mood and Affect: Mood normal.        Behavior: Behavior normal.        Thought Content: Thought content normal.        Judgment:  Judgment normal.      Lab Results:  CBC    Component Value Date/Time   WBC 7.3 02/11/2024 1532   RBC 4.48 02/11/2024 1532   HGB 13.8 02/11/2024 1532   HCT 42.5 02/11/2024 1532   PLT 285 02/11/2024 1532   MCV 94.9 02/11/2024 1532   MCH 30.8 02/11/2024 1532   MCHC 32.5 02/11/2024 1532   RDW 15.3 02/11/2024 1532   LYMPHSABS 1.9 02/11/2024 1532   MONOABS 0.6 02/11/2024 1532   EOSABS 0.2 02/11/2024 1532   BASOSABS 0.1 02/11/2024 1532    BMET    Component Value Date/Time   NA 138 02/11/2024 1532   K 4.0 02/11/2024 1532   CL 99 02/11/2024 1532   CO2 27 02/11/2024 1532  GLUCOSE 80 02/11/2024 1532   BUN 25 (H) 02/11/2024 1532   CREATININE 1.19 (H) 02/11/2024 1532   CALCIUM  9.1 02/11/2024 1532   GFRNONAA 52 (L) 02/11/2024 1532   GFRAA >60 06/10/2020 0640    BNP    Component Value Date/Time   BNP 3,202.0 (H) 09/09/2023 1750    ProBNP No results found for: "PROBNP"  Imaging: No results found.   Assessment & Plan:   1. Moderate obstructive sleep apnea (Primary) - Ambulatory Referral for DME  Assessment and Plan    Obstructive Sleep Apnea Moderate obstructive sleep apnea with an average of seventeen apneic events per hour, confirmed by sleep study in March 2025. She is minimally symptomatic, however, has a significant cardiac history, including atrial fibrillation, SVT, CHF and hypertension. Reviewed that untreated moderate-severe OSA increases risk of cardiac complications. Emphasized the importance of CPAP therapy to reduce the risk of recurrent cardiac arrhythmias. Explained CPAP use, including nasal mask and machine function, to maintain airway patency and prevent apneic events. Highlighted the need for at least four hours of CPAP use per night, preferably throughout the entire sleep period.  - Order CPAP auto settings 5-15cm h20 with nasal mask fitting through medical supply company - Instruct to use CPAP for at least four hours per night - Follow up in 8-12  weeks to assess CPAP tolerance and effectiveness  Antonio Baumgarten, NP 04/06/2024

## 2024-04-06 NOTE — Patient Instructions (Addendum)
 -OBSTRUCTIVE SLEEP APNEA: Obstructive sleep apnea is a condition where your airway becomes blocked during sleep, causing breathing pauses. Your sleep study showed moderate sleep apnea with an average of seventeen apneic events per hour. Due to your cardiac history, it is important to use CPAP therapy to reduce the risk of recurrent cardiac arrhythmias. CPAP involves using a nasal mask connected to a machine that keeps your airway open. You should use the CPAP for at least four hours each night, preferably throughout your entire sleep period. If you experience discomfort, you can discontinue use, but the health benefits are significant given your cardiac history. A CPAP with a nasal mask fitting will be ordered for you through a medical supply company.  -ATRIAL FIBRILLATION: Atrial fibrillation is an irregular and often rapid heart rate that can increase your risk of strokes, heart failure, and other heart-related complications. Your condition is currently well-managed, but untreated sleep apnea can increase the risk of recurrence. Using CPAP therapy can help reduce this risk.  -HYPERTENSION: Hypertension, or high blood pressure, is a condition where the force of the blood against your artery walls is too high. Treating your sleep apnea with CPAP therapy may help improve your blood pressure control.  INSTRUCTIONS: Aim to wear CPAP nightly   Follow-up: Please schedule visit in 8-12 weeks for CPAP compliance   CPAP and BIPAP Information CPAP and BIPAP use air pressure to keep your airways open and help you breathe well. CPAP and BIPAP use different amounts of pressure. Your health care provider will tell you whether CPAP or BIPAP would be best for you. CPAP stands for continuous positive airway pressure. With CPAP, the amount of pressure stays the same while you breathe in and out. BIPAP stands for bi-level positive airway pressure. With BIPAP, the amount of pressure will be higher when you breathe in  and lower when you breathe out. This allows you to take bigger breaths. CPAP or BIPAP may be used in the hospital or at home. You may need to have a sleep study before your provider can order a device for you to use at home. What are the advantages? CPAP and BIPAP are most often used for obstructive sleep apnea to keep the airways from collapsing when the muscles relax during sleep. CPAP or BIPAP can be used if you have: Chronic obstructive pulmonary disease. Heart failure. Medical conditions that cause muscle weakness. Other problems that cause breathing to be shallow, weak, or difficult. What are the risks? Your provider will talk with you about risks. These may include: Sores on your nose or face caused from the mask, prongs, or nasal pillows. Dry or stuffy nose or nosebleeds. Feeling gassy or bloated. Sinus or lung infection if the equipment is not cleaned well. When should CPAP or BIPAP be used? In most cases, CPAP or BIPAP is used during sleep at night or whenever the main sleep time happens. It's also used during naps. People with some medical conditions may need to wear the mask when they're awake. Follow instructions from your provider about when to use your CPAP or BIPAP. What happens during CPAP or BIPAP?  Both CPAP and BIPAP use a small machine that uses electricity to create air pressure. A long tube connects the device to a plastic mask. Air is blown through the mask into your nose or mouth. The amount of pressure that's used to blow the air can be adjusted. Your provider will set the pressure setting and help you find the best mask  for you. Tips for using the mask There are different types and sizes of masks. If your mask does not fit well, talk with your provider about getting a different one. Some common types of masks include: Full face masks, which fit over the mouth and nose. Nasal masks, which fit over the nose. Nasal pillow or prong masks, which fit into the  nostrils. The mask needs to be snug to your face, so some people feel trapped or closed in at first. If you feel this way, you may need to get used to the mask. Hold the mask loosely over your nose or mouth and then gradually put the the mask on more snugly. Slowly increase the amount of time you use the mask. If you have trouble with your mask not fitting well or leaking, talk with your provider. Do not stop using the mask. Tips for using the device Follow instructions from your provider about how to and how often to use the device. For home use, CPAP and BIPAP devices come from home health care companies. There are many different brands. Your health insurance company will help to decide which device you get. Keep the CPAP or BIPAP device and attachments clean. Ask your home health care company or check the instruction book for cleaning instructions. Make sure the humidifier is filled with germ-free (sterile) water  and is working correctly. This will help prevent a dry or stuffy nose or nosebleeds. A nasal saline mist or spray may keep your nose from getting dry and sore. Do not eat or drink while the CPAP or BIPAP device is on. Food or drinks could get pushed into your lungs by the pressure of the CPAP or BIPAP. Follow these instructions at home: Take over-the-counter and prescription medicines only as told by your provider. Do not smoke, vape, or use nicotine or tobacco. Contact a health care provider if: You have redness or pressure sores on your head, face, mouth, or nose from the mask or headgear. You have trouble using the CPAP or BIPAP device. You have trouble going to sleep or staying asleep. Someone tells you that you snore even when wearing your CPAP or BIPAP device. Get help right away if: You have trouble breathing. You feel confused. These symptoms may be an emergency. Get help right away. Call 911. Do not wait to see if the symptoms will go away. Do not drive yourself to the  hospital. This information is not intended to replace advice given to you by your health care provider. Make sure you discuss any questions you have with your health care provider. Document Revised: 03/05/2023 Document Reviewed: 03/05/2023 Elsevier Patient Education  2024 [employer].

## 2024-04-12 DIAGNOSIS — I5042 Chronic combined systolic (congestive) and diastolic (congestive) heart failure: Secondary | ICD-10-CM | POA: Diagnosis not present

## 2024-04-12 DIAGNOSIS — I10 Essential (primary) hypertension: Secondary | ICD-10-CM | POA: Diagnosis not present

## 2024-04-13 ENCOUNTER — Ambulatory Visit (INDEPENDENT_AMBULATORY_CARE_PROVIDER_SITE_OTHER): Admitting: Ophthalmology

## 2024-04-13 ENCOUNTER — Encounter (INDEPENDENT_AMBULATORY_CARE_PROVIDER_SITE_OTHER): Payer: Self-pay | Admitting: Ophthalmology

## 2024-04-13 DIAGNOSIS — I10 Essential (primary) hypertension: Secondary | ICD-10-CM

## 2024-04-13 DIAGNOSIS — H35033 Hypertensive retinopathy, bilateral: Secondary | ICD-10-CM

## 2024-04-13 DIAGNOSIS — H34832 Tributary (branch) retinal vein occlusion, left eye, with macular edema: Secondary | ICD-10-CM

## 2024-04-13 DIAGNOSIS — E113293 Type 2 diabetes mellitus with mild nonproliferative diabetic retinopathy without macular edema, bilateral: Secondary | ICD-10-CM | POA: Diagnosis not present

## 2024-04-13 DIAGNOSIS — H25813 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-related cataract, bilateral: Secondary | ICD-10-CM | POA: Diagnosis not present

## 2024-04-13 DIAGNOSIS — Z7984 Long term (current) use of oral hypoglycemic drugs: Secondary | ICD-10-CM

## 2024-04-13 MED ORDER — FARICIMAB-SVOA 6 MG/0.05ML IZ SOSY
6.0000 mg | PREFILLED_SYRINGE | INTRAVITREAL | Status: AC | PRN
  Administered 2024-04-13: 6 mg via INTRAVITREAL

## 2024-04-15 ENCOUNTER — Ambulatory Visit: Payer: 59 | Attending: Cardiology | Admitting: Cardiology

## 2024-04-15 ENCOUNTER — Encounter: Payer: Self-pay | Admitting: Cardiology

## 2024-04-15 VITALS — BP 144/70 | HR 53 | Ht 63.0 in | Wt 184.0 lb

## 2024-04-15 DIAGNOSIS — I5022 Chronic systolic (congestive) heart failure: Secondary | ICD-10-CM | POA: Diagnosis not present

## 2024-04-15 DIAGNOSIS — E782 Mixed hyperlipidemia: Secondary | ICD-10-CM | POA: Diagnosis not present

## 2024-04-15 DIAGNOSIS — I10 Essential (primary) hypertension: Secondary | ICD-10-CM | POA: Diagnosis not present

## 2024-04-15 DIAGNOSIS — I471 Supraventricular tachycardia, unspecified: Secondary | ICD-10-CM

## 2024-04-15 MED ORDER — DAPAGLIFLOZIN PROPANEDIOL 10 MG PO TABS: ORAL_TABLET | Freq: Every day | ORAL | 3 refills | Status: DC

## 2024-04-15 MED ORDER — FUROSEMIDE 20 MG PO TABS: 20.0000 mg | ORAL_TABLET | Freq: Every day | ORAL | 3 refills | Status: AC

## 2024-04-15 MED ORDER — HYDRALAZINE HCL 50 MG PO TABS: 50.0000 mg | ORAL_TABLET | Freq: Three times a day (TID) | ORAL | 3 refills | Status: AC

## 2024-04-15 NOTE — Progress Notes (Signed)
 Clinical Summary Ms. Keeven is a 63 y.o.female seen today for follow up of the following medical problem.s     1. Chronic systolic/diastolic HF - admit 07/6294 with pulmonary edema - EKG with LBBB, had WMA on echo referred for cath - 03/2020 cath: normal coronaries, LVEDP 10 - 03/2020 echo: LVEF 45%, anteroseptal wall hypokinesis, grade II diastolic dysfunction     Admitted 01/2021 with acute on chronic HF 01/2021 echo: LVEF 25-30%, mod to severe RV dysfunction, mod to severe TR - difficult to diurese with low bp's, worsening renal function. 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     07/2021 echo: LVEF 55-60%, no WMAs, grade I dd.   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 HTN - she is compliant with emds -      3. Hyperlipidemia -compliant with meds 07/2023 TC 181 TG 112 HDL 64 LDL 95  - 01/2024 TC 169 TG 115 HDL 64 LDL 82   4. PSVT/Atach - had some runs during prior hospital admission - 04/2020 monitor Min HR 38, Max HR 163, Avg HR 72. Min HR occurred in early AM hours presumably while sleeping Telemetry tracings show sinus rhythm and sinus bradycardia,several runs of atach with aberrancy up to 30 seconds.. Runs of atach followed by postconversion pauses up to 1.4 seconds. 5 beat run of NSVT No symptoms reported     01/2021 admissoins issues with atach, difficult to control due to  intermittent bradycardia - started on amio gtt, transitioned to oral amio - has been on dronederone, followed by EP  04/2023 monitor: min HR 33, Max HR 187, Avg HR 55. 3 NSVT episodes. 51 SVT episoddes longest 35 seconds.  - no recent palpitations.  -her atach can easily be confused as sinus tachycardia     09/2023 ablation was not successful - currently on amidoarone.  - denies any palpitaitons    5. OSA  - followed by pulmonary, working on getting cpap    Past Medical History:  Diagnosis Date   Atrial tachycardia (HCC)    CHF (congestive heart failure) (HCC)    a. EF 45% by echo in 03/2020 with normal cors by cath   Diabetes mellitus without complication (HCC)    Diarrhea    Heart disease    Herpes simplex infection    Hyperlipidemia    Hypertension      No Known Allergies   Current Outpatient Medications  Medication Sig Dispense Refill   albuterol  (VENTOLIN  HFA) 108 (90 Base) MCG/ACT inhaler Inhale 1-2 puffs into the lungs every 4 (four) hours as needed for wheezing or shortness of breath.     amiodarone  (PACERONE ) 200 MG tablet Take 1 tablet (200 mg total) Monday thru Friday NONE on Saturday and Sunday 90 tablet 3   atorvastatin  (LIPITOR ) 80 MG tablet Take 1 tablet (80 mg  total) by mouth daily. 90 tablet 3   ENTRESTO  97-103 MG Take 1 tablet by mouth 2 (two) times daily.     hydrALAZINE  (APRESOLINE ) 50 MG tablet Take 1 tablet (50 mg total) by mouth 3 (three) times daily. 270 tablet 3   isosorbide  mononitrate (IMDUR ) 30 MG 24 hr tablet Take 1.5 tablets (45 mg total) by mouth daily. 135 tablet 3   TRADJENTA  5 MG TABS tablet Take 5 mg by mouth daily.     dapagliflozin  propanediol (FARXIGA ) 10 MG TABS tablet TAKE 1 TABLET (10 MG TOTAL) BY MOUTH DAILY. 90 tablet 3   diclofenac Sodium (VOLTAREN) 1 % GEL Apply 4 g topically daily as needed (stomach pain). (Patient not taking: Reported on 04/15/2024)     furosemide  (LASIX ) 20 MG tablet Take 1 tablet (20 mg total) by mouth daily.  90 tablet 3   No current facility-administered medications for this visit.     Past Surgical History:  Procedure Laterality Date   CESAREAN SECTION     CHOLECYSTECTOMY     COLONOSCOPY N/A 08/26/2014   Procedure: COLONOSCOPY;  Surgeon: Alyce Jubilee, MD;  Location: [facility];  Service: Endoscopy;  Laterality: N/A;  9:30 AM - moved to 8:30 - Ginger to notify pt   LEFT HEART CATH AND CORONARY ANGIOGRAPHY N/A 04/14/2020   Procedure: LEFT HEART CATH AND CORONARY ANGIOGRAPHY;  Surgeon: Swaziland, [name], MD;  Location: [facility] INVASIVE CV LAB;  Service: Cardiovascular;  Laterality: N/A;   RIGHT/LEFT HEART CATH AND CORONARY ANGIOGRAPHY N/A 02/19/2021   Procedure: RIGHT/LEFT HEART CATH AND CORONARY ANGIOGRAPHY;  Surgeon: Darlis Eisenmenger, MD;  Location: [facility] INVASIVE CV LAB;  Service: Cardiovascular;  Laterality: N/A;   SVT ABLATION N/A 09/30/2023   Procedure: SVT ABLATION;  Surgeon: Tammie Fall, MD;  Location: [facility] INVASIVE CV LAB;  Service: Cardiovascular;  Laterality: N/A;     No Known Allergies    Family History  Problem Relation Age of Onset   Hypertension Mother    Diabetes Mother    Colon cancer Neg Hx      Social History Ms. Sarchet reports that she has never smoked. She has never used smokeless tobacco. Ms. Cozart reports no history of alcohol  use.     Physical Examination Today's Vitals   04/15/24 1033 04/15/24 1056  BP: (!) 142/70 (!) 144/70  Pulse: (!) 53   SpO2: 94%   Weight: 184 lb (83.5 kg)   Height: 5\' 3"  (1.6 m)   PainSc: 0-No pain    Body mass index is 32.59 kg/m.; Gen: resting comfortably, no acute distress HEENT: no scleral icterus, pupils equal round and reactive, no palptable cervical adenopathy,  CV: RRR, no m/rg, no jvd Resp: Clear to auscultation bilaterally GI: abdomen is soft, non-tender, non-distended, normal bowel sounds, no hepatosplenomegaly MSK: extremities are warm, no edema.  Skin: warm, no rash Neuro:  no focal deficits Psych:  appropriate affect   Diagnostic Studies  01/2021 echo IMPRESSIONS     1. Global hypokinesis with more prominent anteroseptal hypokinesis. .  Left ventricular ejection fraction, by estimation, is 25 to 30%. The left  ventricle has severely decreased function. The left ventricle demonstrates  global hypokinesis. There is  moderate left ventricular hypertrophy. Left ventricular diastolic  parameters are indeterminate.   2. Right ventricular systolic function moderately to severely reduced.  The right ventricular size is severely enlarged. There is moderately  elevated pulmonary artery systolic pressure.   3. Left atrial size was severely dilated.  4. Right atrial size was severely dilated.   5. The mitral valve is normal in structure. No evidence of mitral valve  regurgitation. No evidence of mitral stenosis.   6. By color TR looks moderate. Hepatic systolic flow reversal would  suggest more severe TR. . The tricuspid valve is abnormal. Tricuspid valve  regurgitation is moderate to severe.   7. The aortic valve has an indeterminant number of cusps. Aortic valve  regurgitation is not visualized. No aortic stenosis is present.   8. The inferior vena cava is normal in size with <50% respiratory  variability, suggesting right atrial pressure of 8 mmHg.    01/2021 RHC/LHC 1. Low filling pressures.  2. Preserved cardiac output.  3. No significant CAD.      01/2021 CMRI IMPRESSION: 1. Normal LV size with EF 20%, diffuse hypokinesis with septal-lateral dyssynchrony c/w LBBB.   2.  Normal RV size with EF 26%.   3. Diffuse delayed enhancement imaging, however there does appear to be LGE in the septum that could be in a coronary disease-type pattern.       03/2023 echo 1. Left ventricular ejection fraction, by estimation, is 45%. The left  ventricle has mildly decreased function. The left ventricle demonstrates  regional wall motion abnormalities (see scoring diagram/findings for   description). There is mild left  ventricular hypertrophy. Left ventricular diastolic parameters are  consistent with Grade III diastolic dysfunction (restrictive). Elevated  left atrial pressure.   2. Right ventricular systolic function is normal. The right ventricular  size is normal. There is normal pulmonary artery systolic pressure.   3. Left atrial size was moderately dilated.   4. The mitral valve is normal in structure. Trivial mitral valve  regurgitation. No evidence of mitral stenosis.   5. The tricuspid valve is abnormal.   6. The aortic valve is tricuspid. Aortic valve regurgitation is not  visualized. No aortic stenosis is present.   7. The inferior vena cava is normal in size with greater than 50%  respiratory variability, suggesting right atrial pressure of 3 mmHg.      Assessment and Plan  1.Chronic  HFrEF - no beta blocker due to bradycardia. 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 Atach/PSVT - per EP, currently on amiodarone  - doing well without symptoms, continue current meds   3.HTN -bp above goal, increase hydralazine  to 50mg  tid.    4. HLD - at goal, continue current meds    Laurann Pollock, M.D.

## 2024-04-15 NOTE — Patient Instructions (Signed)
 Medication Instructions:  Your physician has recommended you make the following change in your medication:   -Increase Hydralazine  to 50 mg three times daily   *If you need a refill on your cardiac medications before your next appointment, please call your pharmacy*  Lab Work: None If you have labs (blood work) drawn today and your tests are completely normal, you will receive your results only by: MyChart Message (if you have MyChart) OR A paper copy in the mail If you have any lab test that is abnormal or we need to change your treatment, we will call you to review the results.  Testing/Procedures: None  Follow-Up: At [facility], you and your health needs are our priority.  As part of our continuing mission to provide you with exceptional heart care, our providers are all part of one team.  This team includes your primary Cardiologist (physician) and Advanced Practice Providers or APPs (Physician Assistants and Nurse Practitioners) who all work together to provide you with the care you need, when you need it.  Your next appointment:   6 month(s)  Provider:   You may see Armida Lander, MD or one of the following Advanced Practice Providers on your designated Care Team:   Woodfin Hays, PA-C  [place], [place] Theotis Flake, [place]     We recommend signing up for the patient portal called "MyChart".  Sign up information is provided on this After Visit Summary.  MyChart is used to connect with patients for Virtual Visits (Telemedicine).  Patients are able to view lab/test results, encounter notes, upcoming appointments, etc.  Non-urgent messages can be sent to your provider as well.   To learn more about what you can do with MyChart, go to [email or web address].   Other Instructions

## 2024-04-18 ENCOUNTER — Encounter (INDEPENDENT_AMBULATORY_CARE_PROVIDER_SITE_OTHER): Payer: Self-pay | Admitting: Ophthalmology

## 2024-04-22 DIAGNOSIS — G4733 Obstructive sleep apnea (adult) (pediatric): Secondary | ICD-10-CM | POA: Diagnosis not present

## 2024-05-13 DIAGNOSIS — I10 Essential (primary) hypertension: Secondary | ICD-10-CM | POA: Diagnosis not present

## 2024-05-13 DIAGNOSIS — I5042 Chronic combined systolic (congestive) and diastolic (congestive) heart failure: Secondary | ICD-10-CM | POA: Diagnosis not present

## 2024-05-20 NOTE — Progress Notes (Signed)
 [facility] - Clinic Note  05/25/2024    CHIEF COMPLAINT Patient presents for Retina Follow Up   HISTORY OF PRESENT ILLNESS: Michelle Morrison is a 63 y.o. female who presents to the clinic today for:   HPI     Retina Follow Up   Patient presents with  CRVO/BRVO.  In left eye.  This started 6 weeks ago.  Duration of 6 weeks.  Since onset it is stable.  I, the attending physician,  performed the HPI with the patient and updated documentation appropriately.        Comments   6 week retina follow up BRVO OS and IVV OS pt is reporting no vision changes noticed she denies any flashes or floaters her last reading 115      Last edited by Valdemar Rogue, MD on 05/25/2024  9:45 AM.    Pt feels like vision is improved  Referring physician: [name], [name], MD [address] [place],  [place] [address]  HISTORICAL INFORMATION:   Selected notes from the MEDICAL RECORD NUMBER Referred by Dr. [name]: 05.08.23 Ocular Hx- NPDR OS  Patient saw Dr. [name].  Vision at that time was OD 20/20, OS 20/25    CURRENT MEDICATIONS: No current outpatient medications on file. (Ophthalmic Drugs)   No current facility-administered medications for this visit. (Ophthalmic Drugs)   Current Outpatient Medications (Other)  Medication Sig   albuterol  (VENTOLIN  HFA) 108 (90 Base) MCG/ACT inhaler Inhale 1-2 puffs into the lungs every 4 (four) hours as needed for wheezing or shortness of breath.   amiodarone  (PACERONE ) 200 MG tablet Take 1 tablet (200 mg total) Monday thru Friday NONE on Saturday and Sunday   atorvastatin  (LIPITOR ) 80 MG tablet Take 1 tablet (80 mg total) by mouth daily.   dapagliflozin  propanediol (FARXIGA ) 10 MG TABS tablet TAKE 1 TABLET (10 MG TOTAL) BY MOUTH DAILY.   diclofenac Sodium (VOLTAREN) 1 % GEL Apply 4 g topically daily as needed (stomach pain). (Patient not taking: Reported on 04/15/2024)   ENTRESTO  97-103 MG Take 1 tablet by mouth 2  (two) times daily.   furosemide  (LASIX ) 20 MG tablet Take 1 tablet (20 mg total) by mouth daily.   hydrALAZINE  (APRESOLINE ) 50 MG tablet Take 1 tablet (50 mg total) by mouth 3 (three) times daily.   isosorbide  mononitrate (IMDUR ) 30 MG 24 hr tablet Take 1.5 tablets (45 mg total) by mouth daily.   TRADJENTA  5 MG TABS tablet Take 5 mg by mouth daily.   No current facility-administered medications for this visit. (Other)   REVIEW OF SYSTEMS: ROS   Positive for: Endocrine, Cardiovascular, Eyes, Respiratory Negative for: Constitutional, Gastrointestinal, Neurological, Skin, Genitourinary, Musculoskeletal, HENT, Psychiatric, Allergic/Imm, Heme/Lymph Last edited by [name], COT on 05/25/2024  8:15 AM.     ALLERGIES No Known Allergies  PAST MEDICAL HISTORY Past Medical History:  Diagnosis Date   Atrial tachycardia (HCC)    CHF (congestive heart failure) (HCC)    a. EF 45% by echo in 03/2020 with normal cors by cath   Diabetes mellitus without complication (HCC)    Diarrhea    Heart disease    Herpes simplex infection    Hyperlipidemia    Hypertension    Past Surgical History:  Procedure Laterality Date   CESAREAN SECTION     CHOLECYSTECTOMY     COLONOSCOPY N/A 08/26/2014   Procedure: COLONOSCOPY;  Surgeon: [name], MD;  Location: [facility];  Service: Endoscopy;  Laterality: N/A;  9:30 AM - moved to 8:30 - Ginger to notify pt   LEFT HEART CATH AND CORONARY ANGIOGRAPHY N/A 04/14/2020   Procedure: LEFT HEART CATH AND CORONARY ANGIOGRAPHY;  Surgeon: [place]az[place], [name], MD;  Location[facility] INVASIVE CV LAB;  Service: Cardiovascular;  Laterality: N/A;   RIGHT/LEFT HEART CATH AND CORONARY ANGIOGRAPHY N/A 02/19/2021   Procedure: RIGHT/LEFT HEART CATH AND CORONARY ANGIOGRAPHY;  Surgeon: [name], MD;  Location: [facility] INVASIVE CV LAB;  Service: Cardiovascular;  Laterality: N/A;   SVT ABLATION N/A 09/30/2023   Procedure: SVT ABLATION;  Surgeon: [name], MD;   Location: [facility] INVASIVE CV LAB;  Service: Cardiovascular;  Laterality: N/A;   FAMILY HISTORY Family History  Problem Relation Age of Onset   Hypertension Mother    Diabetes Mother    Colon cancer Neg Hx    SOCIAL HISTORY Social History   Tobacco Use   Smoking status: Never   Smokeless tobacco: Never  Vaping Use   Vaping status: Never Used  Substance Use Topics   Alcohol  use: No    Alcohol /week: 0.0 standard drinks of alcohol    Drug use: No       OPHTHALMIC EXAM:  Base Eye Exam     Visual Acuity (Snellen - Linear)       Right Left   Dist [place] 20/25 20/30   Dist ph  NI NI         Tonometry (Tonopen, 8:22 AM)       Right Left   Pressure 18 18         Pupils       Pupils Dark Light Shape React APD   Right PERRL 2 1 Round Brisk None   Left PERRL 2 1 Round Brisk None         Visual Fields       Left Right    Full Full         Extraocular Movement       Right Left    Full, Ortho Full, Ortho         Neuro/Psych     Oriented x3: Yes   Mood/Affect: Normal         Dilation     Both eyes: 2.5% Phenylephrine @ 8:22 AM           Slit Lamp and Fundus Exam     Slit Lamp Exam       Right Left   Lids/Lashes Dermatochalasis - upper lid, Meibomian gland dysfunction Dermatochalasis - upper lid, Meibomian gland dysfunction   Conjunctiva/Sclera melanosis melanosis   Cornea trace PEE trace PEE   Anterior Chamber deep, narrow temporal angle deep, narrow temporal angle   Iris Round and dilated, No NVI Round and dilated, No NVI   Lens 2+ Nuclear sclerosis, 2+ Cortical cataract 2+ Nuclear sclerosis, 2+ Cortical cataract   Anterior Vitreous Vitreous syneresis Vitreous syneresis         Fundus Exam       Right Left   Disc Pink and sharp, PPP Pink and sharp, PPP   C/D Ratio 0.6 0.6   Macula Flat, Good foveal reflex, focal punctate MA temporal to fovea -- improved, no frank edema Blunted foveal reflex, central edema -- slightly improved,  cystic changes superior macula -- slightly increased, punctate perifoveal exudates, cluster of IRH superior macula -- improving   Vessels Attenuated, Tortuous Attenuated, 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 Central Foveal Thickness: 212. Progression has been stable. Findings include normal foveal contour, no IRF, no SRF, intraretinal hyper-reflective material, vitreomacular adhesion (Focal cystic changes).   Left Eye Quality was good. Central Foveal Thickness: 265. Progression has improved. Findings include no SRF, abnormal foveal contour, subretinal hyper-reflective material, intraretinal hyper-reflective material, intraretinal fluid, vitreomacular adhesion (Mild interval increase in IRF/IRHM superior macula; superior fovea slightly improved, central ORA and SRHM).   Notes *Images captured and stored on drive  Diagnosis / Impression:  OD: NFP; Focal cystic changes  OS: BRVO w/ CME - Mild interval increase in IRF/IRHM superior macula; superior fovea slightly improved, central ORA and SRHM  Clinical management:  See below  Abbreviations: NFP - Normal foveal profile. CME - cystoid macular edema. PED - pigment epithelial detachment. IRF - intraretinal fluid. SRF - subretinal fluid. EZ - ellipsoid zone. ERM - epiretinal membrane. ORA - outer retinal atrophy. ORT - outer retinal tubulation. SRHM - subretinal hyper-reflective material. IRHM - intraretinal hyper-reflective material      Intravitreal Injection, Pharmacologic Agent - OS - Left Eye       Time Out 05/25/2024. 9:09 AM. Confirmed correct patient, procedure, site, and patient consented.   Anesthesia Topical anesthesia was used. Anesthetic medications included Lidocaine  2%, Proparacaine 0.5%.   Procedure Preparation  included 5% betadine to ocular surface, eyelid speculum. A (32g) needle was used.   Injection: 6 mg faricimab -svoa 6 MG/0.05ML   Route: Intravitreal, Site: Left Eye   NDC: 49757-903-93, Lot: [id number], Expiration date: 04/24/2025, Waste: 0 mL   Post-op Post injection exam found visual acuity of at least counting fingers. The patient tolerated the procedure well. There were no complications. The patient received written and verbal post procedure care education. Post injection medications were not given.            ASSESSMENT/PLAN:    ICD-10-CM   1. Branch retinal vein occlusion of left eye with macular edema  H34.8320 OCT, Retina - OU - Both Eyes    Intravitreal Injection, Pharmacologic Agent - OS - Left Eye    faricimab -svoa (VABYSMO ) 6mg /0.70mL intravitreal injection    2. Essential hypertension  I10     3. Hypertensive retinopathy of both eyes  H35.033     4. Mild nonproliferative diabetic retinopathy of both eyes without macular edema associated with type 2 diabetes mellitus (HCC)  Z88.6706     5. Long term (current) use of oral hypoglycemic drugs  Z79.84     6. 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-related cataract of both eyes  H25.813      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, ?macroaneurysm - s/p IVA OS #1 (05.17.23), #2 (06.14.23), #3 (08.02.23), #4 (09.06.23), #5 (10.04.23), #6 (11.06.23) -- IVA resistance - s/p IVE OS #1 (12.08.23), #2 (01.17.24), #3 (03.14.24), #4 (04.25.24), #5 (06.07.24), #6 (07.29.24), #7 (09.09.24), #8 (10.22.24), #9 (12.03.24), #10 (01.14.25), #11 (02.25.25) -- IVE resistance ============================ - s/p IVV OS #1 (04.08.25), #2 (05.20.25) **h/o increased fluid OS at 7 wks -- noted on 07.29.24 visit** - BCVA OS improved to 20/30 from 20/40  - OCT OS: Mild interval increase in IRF/IRHM superior  macula; superior fovea slightly  improved, central ORA and SRHM at 6 weeks - recommend IVV OS #3 today, 07.01.25 w/ f/u in 6 wks - pt wishes to proceed with injection   - RBA of procedure discussed, questions answered - IVV informed consent obtained and signed 04.08.25 (OS) - see procedure note - F/U 6 weeks -- DFE/OCT/possible injection   2,3. Hypertensive retinopathy OU - discussed importance of tight BP control - monitor  4,5. Mild nonproliferative diabetic retinopathy w/o DME OD - exam shows rare MA OU  - FA (05.17.23) shows rare MA OU; no NV OU -- BRVO OS as above - OCT OD: NFP; Focal cystic changes and IRHM in temporal macula -- persistent - BCVA OD 20/25 - will continue to hold off on anti-VEGF therapy OD for now  - monitor  6. Mixed Cataract OU - The symptoms of cataract, surgical options, and treatments and risks were discussed with patient. - discussed diagnosis and progression - monitor  Ophthalmic Meds Ordered this visit:  Meds ordered this encounter  Medications   faricimab -svoa (VABYSMO ) 6mg /0.31mL intravitreal injection     Return in about 6 weeks (around 07/06/2024) for f/u BRVO OS, DFE, OCT, Possible Injxn.  There are no Patient Instructions on file for this visit.  This document serves as a record of services personally performed by Redell JUDITHANN Hans, MD, PhD. It was created on their behalf by Auston Muzzy, COMT. The creation of this record is the provider's dictation and/or activities during the visit.  Electronically signed by: Auston Muzzy, COMT 05/25/24 9:46 AM  This document serves as a record of services personally performed by Redell JUDITHANN Hans, MD, PhD. It was created on their behalf by Alan PARAS. Delores, OA an ophthalmic technician. The creation of this record is the provider's dictation and/or activities during the visit.    Electronically signed by: Alan PARAS. Delores, OA 05/25/24 9:46 AM  Redell JUDITHANN Hans, M.D., Ph.D. Diseases & Surgery of the Retina and Vitreous [facility]  I have reviewed the above documentation for accuracy and completeness, and I agree with the above. Redell JUDITHANN Hans, M.D., Ph.D. 05/25/24 9:47 AM   Abbreviations: M myopia (nearsighted); A astigmatism; H hyperopia (farsighted); P presbyopia; Mrx spectacle prescription;  CTL contact lenses; OD right eye; OS left eye; OU both eyes  XT exotropia; ET esotropia; PEK punctate epithelial keratitis; PEE punctate epithelial erosions; DES dry eye syndrome; MGD meibomian gland dysfunction; ATs artificial tears; PFAT's preservative free artificial tears; NSC nuclear sclerotic cataract; PSC posterior subcapsular cataract; ERM epi-retinal membrane; PVD posterior vitreous detachment; RD retinal detachment; DM diabetes mellitus; DR diabetic retinopathy; NPDR non-proliferative diabetic retinopathy; PDR proliferative diabetic retinopathy; CSME clinically significant macular edema; DME diabetic macular edema; dbh dot blot hemorrhages; CWS cotton wool spot; POAG primary open angle glaucoma; C/D cup-to-disc ratio; HVF humphrey visual field; GVF goldmann visual field; OCT optical coherence tomography; IOP intraocular pressure; BRVO Branch retinal vein occlusion; CRVO central retinal vein occlusion; CRAO central retinal artery occlusion; BRAO branch retinal artery occlusion; RT retinal tear; SB scleral buckle; PPV pars plana vitrectomy; VH Vitreous hemorrhage; PRP panretinal laser photocoagulation; IVK intravitreal kenalog; VMT vitreomacular traction; MH Macular hole;  NVD neovascularization of the disc; NVE neovascularization elsewhere; AREDS age related eye disease study; ARMD age related macular degeneration; POAG primary open angle glaucoma; EBMD epithelial/anterior basement membrane dystrophy; ACIOL anterior chamber intraocular lens; IOL intraocular lens; PCIOL posterior chamber intraocular lens; Phaco/IOL phacoemulsification with intraocular lens placement; PRK photorefractive keratectomy;  LASIK laser  assisted in situ keratomileusis; HTN hypertension; DM diabetes mellitus; COPD chronic obstructive pulmonary disease

## 2024-05-25 ENCOUNTER — Ambulatory Visit (INDEPENDENT_AMBULATORY_CARE_PROVIDER_SITE_OTHER): Admitting: Ophthalmology

## 2024-05-25 ENCOUNTER — Encounter (INDEPENDENT_AMBULATORY_CARE_PROVIDER_SITE_OTHER): Payer: Self-pay | Admitting: Ophthalmology

## 2024-05-25 DIAGNOSIS — I10 Essential (primary) hypertension: Secondary | ICD-10-CM

## 2024-05-25 DIAGNOSIS — H35033 Hypertensive retinopathy, bilateral: Secondary | ICD-10-CM

## 2024-05-25 DIAGNOSIS — H34832 Tributary (branch) retinal vein occlusion, left eye, with macular edema: Secondary | ICD-10-CM

## 2024-05-25 DIAGNOSIS — H25813 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-related cataract, bilateral: Secondary | ICD-10-CM | POA: Diagnosis not present

## 2024-05-25 DIAGNOSIS — E113293 Type 2 diabetes mellitus with mild nonproliferative diabetic retinopathy without macular edema, bilateral: Secondary | ICD-10-CM | POA: Diagnosis not present

## 2024-05-25 DIAGNOSIS — Z7984 Long term (current) use of oral hypoglycemic drugs: Secondary | ICD-10-CM | POA: Diagnosis not present

## 2024-05-25 DIAGNOSIS — G4733 Obstructive sleep apnea (adult) (pediatric): Secondary | ICD-10-CM | POA: Diagnosis not present

## 2024-05-25 MED ORDER — FARICIMAB-SVOA 6 MG/0.05ML IZ SOSY
6.0000 mg | PREFILLED_SYRINGE | INTRAVITREAL | Status: AC | PRN
Start: 2024-05-25 — End: 2024-05-25
  Administered 2024-05-25: 6 mg via INTRAVITREAL

## 2024-06-12 DIAGNOSIS — I10 Essential (primary) hypertension: Secondary | ICD-10-CM | POA: Diagnosis not present

## 2024-06-12 DIAGNOSIS — I5042 Chronic combined systolic (congestive) and diastolic (congestive) heart failure: Secondary | ICD-10-CM | POA: Diagnosis not present

## 2024-06-16 ENCOUNTER — Ambulatory Visit: Admitting: Primary Care

## 2024-06-16 ENCOUNTER — Encounter: Payer: Self-pay | Admitting: Primary Care

## 2024-06-16 VITALS — BP 172/98 | HR 80 | Temp 98.4°F | Ht 63.0 in | Wt 216.4 lb

## 2024-06-16 DIAGNOSIS — G4733 Obstructive sleep apnea (adult) (pediatric): Secondary | ICD-10-CM | POA: Diagnosis not present

## 2024-06-16 DIAGNOSIS — I10 Essential (primary) hypertension: Secondary | ICD-10-CM | POA: Diagnosis not present

## 2024-06-16 NOTE — Progress Notes (Signed)
 @Patient  ID: Michelle Morrison, female    DOB: Jan 20, 1961, 63 y.o.   MRN: [id number]  Chief Complaint  Patient presents with   Follow-up    Cpap compliance f/u    Referring provider: Carlette Benita Area*  HPI 63 year old female, never smoked. PMH significant for CHF, HTN, aflutter, type 2 diabetes, CKD.   Previous [facility] pulmonary encounter:  01/12/2024 Discussed the use of AI scribe software for clinical note transcription with the patient, who gave verbal consent to proceed.  History of Present Illness   Michelle Morrison is a 64 year old female with atrial fibrillation, supraventricular tachycardia, heart failure, and hypertension who presents for a sleep consult. She was referred by her cardiologist, for evaluation of potential sleep apnea.  She occasionally wakes up gasping or catching her breath. She has never been told she snores, as she sleeps alone. She does not have a set bedtime, typically going to bed between 10 PM and midnight, and does not have difficulty falling asleep. She wakes up once or twice a night to use the restroom, which she attributes to her diuretic medication. She usually gets up for the day around 7 AM.  Her cardiac history includes atrial fibrillation, supraventricular tachycardia, heart failure, and hypertension. She underwent a cardiac ablation in November, which has since resolved her arrhythmia symptoms. She is under the care of Dr. Dorn Ross and Dr. Cathlyn Birmingham for her heart conditions.  She continues to take her prescribed medications, including Lasix  20 mg once daily in the morning.  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 Epworth score 2/24. No concern for narcolepsy, cataplexy or sleep walking.       04/06/2024 Discussed the use of AI scribe software for clinical note transcription with the patient, who gave verbal consent to proceed.  History of Present Illness   Michelle Morrison is a 63 year old female with  atrial fibrillation who presents for a follow-up regarding sleep apnea. She was referred by her cardiologist for evaluation of sleep apnea due to its potential impact on her cardiac health.  She has a history of atrial fibrillation and hypertension. A sleep study conducted in March 2025 revealed moderate obstructive sleep apnea, with an average of seventeen apneic events per hour.  She acknowledges having been told she snores and occasionally wakes herself up at night. No frequent snoring or daytime sleepiness. She does not report feeling tired during the day and denies falling asleep unexpectedly. She describes her sleep as generally restful, with minimal tossing and turning, except when engaged in activities like cleaning or working.  She is not currently using any specific treatment for sleep apnea.     Obstructive Sleep Apnea Moderate obstructive sleep apnea with an average of seventeen apneic events per hour, confirmed by sleep study in March 2025. She is minimally symptomatic, however, has a significant cardiac history, including atrial fibrillation, SVT, CHF and hypertension. Reviewed that untreated moderate-severe OSA increases risk of cardiac complications. Emphasized the importance of CPAP therapy to reduce the risk of recurrent cardiac arrhythmias. Explained CPAP use, including nasal mask and machine function, to maintain airway patency and prevent apneic events. Highlighted the need for at least four hours of CPAP use per night, preferably throughout the entire sleep period.  - Order CPAP auto settings 5-15cm h20 with nasal mask fitting through medical supply company - Instruct to use CPAP for at least four hours per night - Follow up in  8-12 weeks to assess CPAP tolerance and effectiveness    06/16/2024- Interim hx  Discussed the use of AI scribe software for clinical note transcription with the patient, who gave verbal consent to proceed.  History of Present Illness   Michelle Morrison  is a 63 year old female with moderate obstructive sleep apnea who presents for a follow-up after starting CPAP therapy.  She underwent a sleep study in March 2025, which revealed moderate obstructive sleep apnea. She was subsequently started on CPAP therapy and this is her first follow-up visit since initiating the treatment.  She uses the CPAP machine almost every night and states that it helps her significantly at night. However, compliance is about 30% of the time, with usage for more than four hours on 22% of the nights. She forgets to use the machine on some nights due to tiredness.  The CPAP therapy has improved her breathing, sleep quality, and energy levels, allowing her to walk better and feel less tired. No issues with the CPAP machine itself, but she mentions needing more supplies for the machine, such as the lung thing, which she changes every two weeks.  Her past medical history is significant for atrial fibrillation, supraventricular tachycardia, congestive heart failure, and hypertension.     Airview download 04/15/24-06/13/24 Usage 18/60 (30%); 13/60 (22%) > 4 hours Average usage days used 4 hours 25 mins Pressure 5-15cm h20 (14.7cm h20-95%) Airleaks 10.8L/min AHI 1.3  Sleep testing 01/30/24 HST>> Moderate OSA, AHI 17.5/hour with SpO2 low 76%    No Known Allergies  Immunization History  Administered Date(s) Administered   Influenza-Unspecified 11/25/2018   Moderna Sars-Covid-2 Vaccination 02/17/2020, 03/16/2020    Past Medical History:  Diagnosis Date   Atrial tachycardia (HCC)    CHF (congestive heart failure) (HCC)    a. EF 45% by echo in 03/2020 with normal cors by cath   Diabetes mellitus without complication (HCC)    Diarrhea    Heart disease    Herpes simplex infection    Hyperlipidemia    Hypertension     Tobacco History: Social History   Tobacco Use  Smoking Status Never  Smokeless Tobacco Never   Counseling given: Not Answered   Outpatient  Medications Prior to Visit  Medication Sig Dispense Refill   albuterol  (VENTOLIN  HFA) 108 (90 Base) MCG/ACT inhaler Inhale 1-2 puffs into the lungs every 4 (four) hours as needed for wheezing or shortness of breath.     amiodarone  (PACERONE ) 200 MG tablet Take 1 tablet (200 mg total) Monday thru Friday NONE on Saturday and Sunday 90 tablet 3   atorvastatin  (LIPITOR ) 80 MG tablet Take 1 tablet (80 mg total) by mouth daily. 90 tablet 3   dapagliflozin  propanediol (FARXIGA ) 10 MG TABS tablet TAKE 1 TABLET (10 MG TOTAL) BY MOUTH DAILY. 90 tablet 3   diclofenac Sodium (VOLTAREN) 1 % GEL Apply 4 g topically daily as needed (stomach pain).     ENTRESTO  97-103 MG Take 1 tablet by mouth 2 (two) times daily.     furosemide  (LASIX ) 20 MG tablet Take 1 tablet (20 mg total) by mouth daily. 90 tablet 3   hydrALAZINE  (APRESOLINE ) 50 MG tablet Take 1 tablet (50 mg total) by mouth 3 (three) times daily. 270 tablet 3   isosorbide  mononitrate (IMDUR ) 30 MG 24 hr tablet Take 1.5 tablets (45 mg total) by mouth daily. 135 tablet 3   TRADJENTA  5 MG TABS tablet Take 5 mg by mouth daily.     No  facility-administered medications prior to visit.      Review of Systems  Review of Systems  Constitutional: Negative.   Respiratory: Negative.    Cardiovascular: Negative.     Physical Exam  BP (!) 172/98 (BP Location: Left Arm, Cuff Size: Large)   Pulse 80   Temp 98.4 F (36.9 C) (Oral)   Ht 5' 3 (1.6 m)   Wt 216 lb 6.4 oz (98.2 kg)   LMP 05/23/2007   SpO2 99%   BMI 38.33 kg/m  Physical Exam Constitutional:      Appearance: Normal appearance.  HENT:     Head: Normocephalic and atraumatic.     Mouth/Throat:     Mouth: Mucous membranes are moist.     Pharynx: Oropharynx is clear.  Cardiovascular:     Rate and Rhythm: Normal rate and regular rhythm.  Pulmonary:     Effort: Pulmonary effort is normal.     Breath sounds: Normal breath sounds.  Musculoskeletal:        General: Normal range of motion.   Skin:    General: Skin is warm and dry.  Neurological:     General: No focal deficit present.     Mental Status: She is alert and oriented to person, place, and time. Mental status is at baseline.  Psychiatric:        Mood and Affect: Mood normal.        Behavior: Behavior normal.        Thought Content: Thought content normal.        Judgment: Judgment normal.      Lab Results:  CBC    Component Value Date/Time   WBC 7.3 02/11/2024 1532   RBC 4.48 02/11/2024 1532   HGB 13.8 02/11/2024 1532   HCT 42.5 02/11/2024 1532   PLT 285 02/11/2024 1532   MCV 94.9 02/11/2024 1532   MCH 30.8 02/11/2024 1532   MCHC 32.5 02/11/2024 1532   RDW 15.3 02/11/2024 1532   LYMPHSABS 1.9 02/11/2024 1532   MONOABS 0.6 02/11/2024 1532   EOSABS 0.2 02/11/2024 1532   BASOSABS 0.1 02/11/2024 1532    BMET    Component Value Date/Time   NA 138 02/11/2024 1532   K 4.0 02/11/2024 1532   CL 99 02/11/2024 1532   CO2 27 02/11/2024 1532   GLUCOSE 80 02/11/2024 1532   BUN 25 (H) 02/11/2024 1532   CREATININE 1.19 (H) 02/11/2024 1532   CALCIUM  9.1 02/11/2024 1532   GFRNONAA 52 (L) 02/11/2024 1532   GFRAA >60 06/10/2020 0640    BNP    Component Value Date/Time   BNP 3,202.0 (H) 09/09/2023 1750    ProBNP No results found for: PROBNP  Imaging: Intravitreal Injection, Pharmacologic Agent - OS - Left Eye Result Date: 05/25/2024 Time Out 05/25/2024. 9:09 AM. Confirmed correct patient, procedure, site, and patient consented. Anesthesia Topical anesthesia was used. Anesthetic medications included Lidocaine  2%, Proparacaine 0.5%. Procedure Preparation included 5% betadine to ocular surface, eyelid speculum. A (32g) needle was used. Injection: 6 mg faricimab -svoa 6 MG/0.05ML   Route: Intravitreal, Site: Left Eye   NDC: 49757-903-93, Lot: [id number], Expiration date: 04/24/2025, Waste: 0 mL Post-op Post injection exam found visual acuity of at least counting fingers. The patient tolerated the procedure well.  There were no complications. The patient received written and verbal post procedure care education. Post injection medications were not given.   OCT, Retina - OU - Both Eyes Result Date: 05/25/2024 Right Eye Quality was poor. Central Foveal Thickness: 212.  Progression has been stable. Findings include normal foveal contour, no IRF, no SRF, intraretinal hyper-reflective material, vitreomacular adhesion (Focal cystic changes). Left Eye Quality was good. Central Foveal Thickness: 265. Progression has improved. Findings include no SRF, abnormal foveal contour, subretinal hyper-reflective material, intraretinal hyper-reflective material, intraretinal fluid, vitreomacular adhesion (Mild interval increase in IRF/IRHM superior macula; superior fovea slightly improved, central ORA and SRHM). Notes *Images captured and stored on drive Diagnosis / Impression: OD: NFP; Focal cystic changes OS: BRVO w/ CME - Mild interval increase in IRF/IRHM superior macula; superior fovea slightly improved, central ORA and SRHM Clinical management: See below Abbreviations: NFP - Normal foveal profile. CME - cystoid macular edema. PED - pigment epithelial detachment. IRF - intraretinal fluid. SRF - subretinal fluid. EZ - ellipsoid zone. ERM - epiretinal membrane. ORA - outer retinal atrophy. ORT - outer retinal tubulation. SRHM - subretinal hyper-reflective material. IRHM - intraretinal hyper-reflective material     Assessment & Plan:   1. Moderate obstructive sleep apnea (Primary)  2. HTN (hypertension), benign  Assessment and Plan    Obstructive Sleep Apnea Moderate obstructive sleep apnea diagnosed via sleep study in March 2025. CPAP therapy initiated to reduce risk of cardiac complications. Current compliance is suboptimal with usage on 30% of nights and more than 4 hours on 22% of nights. Pressure auto setting 5-15cm h20. AHI on CPAP is 1.3, indicating well-controlled apnea. She reports improved breathing, sleep, and energy  levels with CPAP use. - Encourage CPAP use every night for at least 4 hours. - Schedule follow-up in 6 weeks to reassess compliance. - Educate on changing CPAP components: change mask every 2-4 weeks, filter monthly, tubing every 3 months.  Atrial Fibrillation Atrial fibrillation with increased risk of cardiac arrhythmias. - Continue Amiodarone  200mg  daily - Emphasize importance of CPAP therapy in reducing risk of cardiac arrhythmias.  Hypertension Blood pressure is elevated today but she is asymptomatic, patient did not take mediation this morning  - Continue Imdur  45mg  daily, Hydralazine  50mg  TID - Advised patient to take medication when she gets home     Almarie LELON Ferrari, NP 06/16/2024

## 2024-06-16 NOTE — Patient Instructions (Signed)
  VISIT SUMMARY: Michelle Morrison, a 63 year old female with moderate obstructive sleep apnea, came in for a follow-up after starting CPAP therapy. She reports that the CPAP machine has significantly improved her breathing, sleep quality, and energy levels, although her compliance with the therapy is currently suboptimal. She also mentioned needing more supplies for the CPAP machine.  YOUR PLAN: -OBSTRUCTIVE SLEEP APNEA: Obstructive sleep apnea is a condition where your airway becomes blocked during sleep, causing breathing pauses. You were diagnosed with moderate obstructive sleep apnea in March 2025 and started on CPAP therapy to reduce the risk of heart complications. Your current compliance is low, with usage on 30% of nights and more than 4 hours on 22% of nights. Your apnea-hypopnea index (AHI) on CPAP is 1.3, indicating well-controlled apnea. Please use the CPAP machine every night for at least 4 hours. We will reassess your compliance in 6 weeks. Remember to change the CPAP mask every 2-4 weeks, the filter monthly, and the tubing every 3 months.  -ATRIAL FIBRILLATION: Atrial fibrillation is an irregular and often rapid heart rate that can increase the risk of strokes, heart failure, and other heart-related complications. Using your CPAP machine as prescribed can help reduce the risk of these complications.  -HYPERTENSION: Hypertension is high blood pressure, which can lead to severe health complications and increase the risk of heart disease, stroke, and sometimes death. Using your CPAP machine as prescribed can help reduce the risk of these complications. Make sure to take your medication today when you get home.   INSTRUCTIONS: Please schedule a follow-up appointment in 6 weeks to reassess your compliance with the CPAP therapy. Make sure to use the CPAP machine every night for at least 4 hours and change the CPAP mask every 2-4 weeks, the filter monthly, and the tubing every 3  months.   Follow-up 6-8 weeks with Beth NP in person for CPAP compliance

## 2024-06-22 NOTE — Progress Notes (Signed)
 [facility] - Clinic Note  07/06/2024    CHIEF COMPLAINT Patient presents for Retina Follow Up   HISTORY OF PRESENT ILLNESS: Michelle Morrison is a 63 y.o. female who presents to the clinic today for:   HPI     Retina Follow Up   Patient presents with  CRVO/BRVO.  In left eye.  This started 6 weeks ago.  I, the attending physician,  performed the HPI with the patient and updated documentation appropriately.        Comments   Patient here for 6 weeks retina follow up for BRVO OS. Patient states vision doing good. No eye pain. Has a sty left upper lid.      Last edited by Valdemar Rogue, MD on 07/06/2024  5:50 PM.     Pt states she has a bump on lid on OS. Pt given advise to use warm compress.   Referring physician: [name], [name], MD [address] [place],  [place] [address]  HISTORICAL INFORMATION:   Selected notes from the MEDICAL RECORD NUMBER Referred by Dr. [name]: 05.08.23 Ocular Hx- NPDR OS  Patient saw Dr. [name].  Vision at that time was OD 20/20, OS 20/25    CURRENT MEDICATIONS: No current outpatient medications on file. (Ophthalmic Drugs)   No current facility-administered medications for this visit. (Ophthalmic Drugs)   Current Outpatient Medications (Other)  Medication Sig   albuterol  (VENTOLIN  HFA) 108 (90 Base) MCG/ACT inhaler Inhale 1-2 puffs into the lungs every 4 (four) hours as needed for wheezing or shortness of breath.   amiodarone  (PACERONE ) 200 MG tablet Take 1 tablet (200 mg total) Monday thru Friday NONE on Saturday and Sunday   atorvastatin  (LIPITOR ) 80 MG tablet Take 1 tablet (80 mg total) by mouth daily.   dapagliflozin  propanediol (FARXIGA ) 10 MG TABS tablet TAKE 1 TABLET (10 MG TOTAL) BY MOUTH DAILY.   diclofenac Sodium (VOLTAREN) 1 % GEL Apply 4 g topically daily as needed (stomach pain).   ENTRESTO  97-103 MG Take 1 tablet by mouth 2 (two) times daily.   furosemide  (LASIX ) 20 MG tablet Take  1 tablet (20 mg total) by mouth daily.   hydrALAZINE  (APRESOLINE ) 50 MG tablet Take 1 tablet (50 mg total) by mouth 3 (three) times daily.   TRADJENTA  5 MG TABS tablet Take 5 mg by mouth daily.   isosorbide  mononitrate (IMDUR ) 30 MG 24 hr tablet Take 1.5 tablets (45 mg total) by mouth daily.   No current facility-administered medications for this visit. (Other)   REVIEW OF SYSTEMS: ROS   Positive for: Endocrine, Cardiovascular, Eyes, Respiratory Negative for: Constitutional, Gastrointestinal, Neurological, Skin, Genitourinary, Musculoskeletal, HENT, Psychiatric, Allergic/Imm, Heme/Lymph Last edited by Orval Asberry RAMAN, COA on 07/06/2024  8:04 AM.      ALLERGIES No Known Allergies  PAST MEDICAL HISTORY Past Medical History:  Diagnosis Date   Atrial tachycardia (HCC)    CHF (congestive heart failure) (HCC)    a. EF 45% by echo in 03/2020 with normal cors by cath   Diabetes mellitus without complication (HCC)    Diarrhea    Heart disease    Herpes simplex infection    Hyperlipidemia    Hypertension    Past Surgical History:  Procedure Laterality Date   CESAREAN SECTION     CHOLECYSTECTOMY     COLONOSCOPY N/A 08/26/2014   Procedure: COLONOSCOPY;  Surgeon: [name], MD;  Location: [facility];  Service: Endoscopy;  Laterality: N/A;  9:30 AM - moved to 8:30 - Ginger to notify pt   LEFT HEART CATH AND CORONARY ANGIOGRAPHY N/A 04/14/2020   Procedure: LEFT HEART CATH AND CORONARY ANGIOGRAPHY;  Surgeon: Swaziland, [name], MD;  Location: [facility] INVASIVE CV LAB;  Service: Cardiovascular;  Laterality: N/A;   RIGHT/LEFT HEART CATH AND CORONARY ANGIOGRAPHY N/A 02/19/2021   Procedure: RIGHT/LEFT HEART CATH AND CORONARY ANGIOGRAPHY;  Surgeon: [name], MD;  Location: [facility] INVASIVE CV LAB;  Service: Cardiovascular;  Laterality: N/A;   SVT ABLATION N/A 09/30/2023   Procedure: SVT ABLATION;  Surgeon: [name], MD;  Location: [facility] INVASIVE CV LAB;  Service: Cardiovascular;  Laterality:  N/A;   FAMILY HISTORY Family History  Problem Relation Age of Onset   Hypertension Mother    Diabetes Mother    Colon cancer Neg Hx    SOCIAL HISTORY Social History   Tobacco Use   Smoking status: Never   Smokeless tobacco: Never  Vaping Use   Vaping status: Never Used  Substance Use Topics   Alcohol  use: No    Alcohol /week: 0.0 standard drinks of alcohol    Drug use: No       OPHTHALMIC EXAM:  Base Eye Exam     Visual Acuity (Snellen - Linear)       Right Left   Dist [place] 20/20 -2 20/25         Tonometry (Tonopen, 8:02 AM)       Right Left   Pressure 19 20         Pupils       Dark Light Shape React APD   Right 2 1 Round Brisk None   Left 2 1 Round Brisk None         Visual Fields (Counting fingers)       Left Right    Full Full         Extraocular Movement       Right Left    Full, Ortho Full, Ortho         Neuro/Psych     Oriented x3: Yes   Mood/Affect: Normal         Dilation     Both eyes: 1.0% Mydriacyl, 2.5% Phenylephrine @ 8:02 AM           Slit Lamp and Fundus Exam     Slit Lamp Exam       Right Left   Lids/Lashes Dermatochalasis, upper lid chalazion, Meibomian gland dysfunction Dermatochalasis - upper lid, Meibomian gland dysfunction   Conjunctiva/Sclera melanosis melanosis   Cornea trace PEE trace PEE   Anterior Chamber deep, narrow temporal angle deep, narrow temporal angle   Iris Round and dilated, No NVI Round and dilated, No NVI   Lens 2+ Nuclear sclerosis, 2+ Cortical cataract 2+ Nuclear sclerosis, 2+ Cortical cataract   Anterior Vitreous Vitreous syneresis Vitreous syneresis         Fundus Exam       Right Left   Disc Pink and sharp, PPP Pink and sharp, PPP   C/D Ratio 0.6 0.6   Macula Flat, Good foveal reflex, no frank heme or edema Blunted foveal reflex, central edema -- slightly improved, cystic changes superior macula -- slightly improved, punctate perifoveal exudates, cluster of IRH superior  macula -- improving   Vessels Attenuated, Tortuous Attenuated, 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 Central Foveal Thickness: 229. Progression has been stable. Findings include normal foveal contour, no IRF, no SRF, intraretinal hyper-reflective material, vitreomacular adhesion .   Left Eye Quality was good. Central Foveal Thickness: 232. Progression has improved. Findings include no SRF, abnormal foveal contour, subretinal hyper-reflective material, intraretinal hyper-reflective material, intraretinal fluid, vitreomacular adhesion (Interval improvement in IRF/IRHM superior and central macula, central ORA and SRHM).   Notes *Images captured and stored on drive  Diagnosis / Impression:  OD: NFP; No IRF/SRF OS: BRVO w/ CME - Interval improvement in IRF/IRHM superior and central macula, central ORA and SRHM  Clinical management:  See below  Abbreviations: NFP - Normal foveal profile. CME - cystoid macular edema. PED - pigment epithelial detachment. IRF - intraretinal fluid. SRF - subretinal fluid. EZ - ellipsoid zone. ERM - epiretinal membrane. ORA - outer retinal atrophy. ORT - outer retinal tubulation. SRHM - subretinal hyper-reflective material. IRHM - intraretinal hyper-reflective material      Intravitreal Injection, Pharmacologic Agent - OS - Left Eye       Time Out 07/06/2024. 9:08 AM. Confirmed correct patient, procedure, site, and patient consented.   Anesthesia Topical anesthesia was used. Anesthetic medications included Lidocaine  2%, Proparacaine 0.5%.   Procedure Preparation included 5% betadine to ocular surface, eyelid speculum. A supplied (32g) needle was used.   Injection: 6 mg faricimab -svoa 6 MG/0.05ML   Route: Intravitreal, Site: Left Eye   NDC:  49757-903-93, Lot: [id number], Expiration date: 06/23/2025, Waste: 0 mL   Post-op Post injection exam found visual acuity of at least counting fingers. The patient tolerated the procedure well. There were no complications. The patient received written and verbal post procedure care education. Post injection medications were not given.             ASSESSMENT/PLAN:    ICD-10-CM   1. Branch retinal vein occlusion of left eye with macular edema  H34.8320 OCT, Retina - OU - Both Eyes    Intravitreal Injection, Pharmacologic Agent - OS - Left Eye    faricimab -svoa (VABYSMO ) 6mg /0.10mL intravitreal injection    2. Essential hypertension  I10     3. Hypertensive retinopathy of both eyes  H35.033     4. Mild nonproliferative diabetic retinopathy of both eyes without macular edema associated with type 2 diabetes mellitus (HCC)  Z88.6706     5. Long term (current) use of oral hypoglycemic drugs  Z79.84     6. 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-related cataract of both eyes  H25.813       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, ?macroaneurysm - s/p IVA OS #1 (05.17.23), #2 (06.14.23), #3 (08.02.23), #4 (09.06.23), #5 (10.04.23), #6 (11.06.23) -- IVA resistance - s/p IVE OS #1 (12.08.23), #2 (01.17.24), #3 (03.14.24), #4 (04.25.24), #5 (06.07.24), #6 (07.29.24), #7 (09.09.24), #8 (10.22.24), #9 (12.03.24), #10 (01.14.25), #11 (02.25.25) -- IVE resistance ============================ - s/p IVV OS #1 (04.08.25), #2 (05.20.25), #3 (07.01.25) **h/o increased fluid OS at 7 wks -- noted on 07.29.24 visit** - BCVA OS 20/25 from 20/30  - OCT OS: Interval improvement in IRF/IRHM superior and central macula, central ORA and SRHM at 6 weeks - recommend IVV OS #4 today, 08.12.25 w/ f/u in 6 wks - pt wishes  to proceed with injection   - RBA of procedure discussed, questions answered - IVV  informed consent obtained and signed 04.08.25 (OS) - see procedure note - F/U 6 weeks -- DFE/OCT/possible injection   2,3. Hypertensive retinopathy OU - discussed importance of tight BP control - monitor  4,5. Mild nonproliferative diabetic retinopathy w/o DME OD - exam shows rare MA OU  - FA (05.17.23) shows rare MA OU; no NV OU -- BRVO OS as above - OCT OD: NFP; Focal cystic changes and IRHM in temporal macula -- persistent - BCVA OD 20/20 from 20/25 - will continue to hold off on anti-VEGF therapy OD for now  - monitor  6. Mixed Cataract OU - The symptoms of cataract, surgical options, and treatments and risks were discussed with patient. - discussed diagnosis and progression - monitor  Ophthalmic Meds Ordered this visit:  Meds ordered this encounter  Medications   faricimab -svoa (VABYSMO ) 6mg /0.56mL intravitreal injection     Return in about 6 weeks (around 08/17/2024) for BRVO OS, DFE, OCT, Possible Injxn.  There are no Patient Instructions on file for this visit.  This document serves as a record of services personally performed by Redell JUDITHANN Hans, MD, PhD. It was created on their behalf by Avelina Pereyra, COA an ophthalmic technician. The creation of this record is the provider's dictation and/or activities during the visit.   Electronically signed by: Avelina GORMAN Pereyra, COT  07/11/24  12:43 AM    Redell JUDITHANN Hans, M.D., Ph.D. Diseases & Surgery of the Retina and Vitreous [facility]  I have reviewed the above documentation for accuracy and completeness, and I agree with the above. Redell JUDITHANN Hans, M.D., Ph.D. 07/11/24 12:45 AM   Abbreviations: M myopia (nearsighted); A astigmatism; H hyperopia (farsighted); P presbyopia; Mrx spectacle prescription;  CTL contact lenses; OD right eye; OS left eye; OU both eyes  XT exotropia; ET esotropia; PEK punctate epithelial keratitis; PEE punctate epithelial erosions; DES dry eye syndrome; MGD meibomian gland  dysfunction; ATs artificial tears; PFAT's preservative free artificial tears; NSC nuclear sclerotic cataract; PSC posterior subcapsular cataract; ERM epi-retinal membrane; PVD posterior vitreous detachment; RD retinal detachment; DM diabetes mellitus; DR diabetic retinopathy; NPDR non-proliferative diabetic retinopathy; PDR proliferative diabetic retinopathy; CSME clinically significant macular edema; DME diabetic macular edema; dbh dot blot hemorrhages; CWS cotton wool spot; POAG primary open angle glaucoma; C/D cup-to-disc ratio; HVF humphrey visual field; GVF goldmann visual field; OCT optical coherence tomography; IOP intraocular pressure; BRVO Branch retinal vein occlusion; CRVO central retinal vein occlusion; CRAO central retinal artery occlusion; BRAO branch retinal artery occlusion; RT retinal tear; SB scleral buckle; PPV pars plana vitrectomy; VH Vitreous hemorrhage; PRP panretinal laser photocoagulation; IVK intravitreal kenalog; VMT vitreomacular traction; MH Macular hole;  NVD neovascularization of the disc; NVE neovascularization elsewhere; AREDS age related eye disease study; ARMD age related macular degeneration; POAG primary open angle glaucoma; EBMD epithelial/anterior basement membrane dystrophy; ACIOL anterior chamber intraocular lens; IOL intraocular lens; PCIOL posterior chamber intraocular lens; Phaco/IOL phacoemulsification with intraocular lens placement; PRK photorefractive keratectomy; LASIK laser assisted in situ keratomileusis; HTN hypertension; DM diabetes mellitus; COPD chronic obstructive pulmonary disease

## 2024-07-06 ENCOUNTER — Encounter (INDEPENDENT_AMBULATORY_CARE_PROVIDER_SITE_OTHER): Payer: Self-pay | Admitting: Ophthalmology

## 2024-07-06 ENCOUNTER — Ambulatory Visit (INDEPENDENT_AMBULATORY_CARE_PROVIDER_SITE_OTHER): Admitting: Ophthalmology

## 2024-07-06 DIAGNOSIS — H25813 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-related cataract, bilateral: Secondary | ICD-10-CM

## 2024-07-06 DIAGNOSIS — H35033 Hypertensive retinopathy, bilateral: Secondary | ICD-10-CM | POA: Diagnosis not present

## 2024-07-06 DIAGNOSIS — E113293 Type 2 diabetes mellitus with mild nonproliferative diabetic retinopathy without macular edema, bilateral: Secondary | ICD-10-CM

## 2024-07-06 DIAGNOSIS — Z7984 Long term (current) use of oral hypoglycemic drugs: Secondary | ICD-10-CM | POA: Diagnosis not present

## 2024-07-06 DIAGNOSIS — I10 Essential (primary) hypertension: Secondary | ICD-10-CM | POA: Diagnosis not present

## 2024-07-06 DIAGNOSIS — H34832 Tributary (branch) retinal vein occlusion, left eye, with macular edema: Secondary | ICD-10-CM

## 2024-07-06 MED ORDER — FARICIMAB-SVOA 6 MG/0.05ML IZ SOSY
6.0000 mg | PREFILLED_SYRINGE | INTRAVITREAL | Status: AC | PRN
  Administered 2024-07-06 (×2): 6 mg via INTRAVITREAL

## 2024-07-07 ENCOUNTER — Telehealth: Payer: Self-pay | Admitting: Cardiology

## 2024-07-07 NOTE — Telephone Encounter (Signed)
*  STAT* If patient is at the pharmacy, call can be transferred to refill team.   1. Which medications need to be refilled? (please list name of each medication and dose if known) furosemide  (LASIX ) 20 MG tablet   2. Which pharmacy/location (including street and city if local pharmacy) is medication to be sent to?  [id number] - [place], [place] - [address]    3. Do they need a 30 day or 90 day supply? 90

## 2024-07-07 NOTE — Telephone Encounter (Signed)
 Year supply sent in on 04/15/24.

## 2024-07-13 DIAGNOSIS — I10 Essential (primary) hypertension: Secondary | ICD-10-CM | POA: Diagnosis not present

## 2024-07-13 DIAGNOSIS — I5042 Chronic combined systolic (congestive) and diastolic (congestive) heart failure: Secondary | ICD-10-CM | POA: Diagnosis not present

## 2024-07-23 DIAGNOSIS — G4733 Obstructive sleep apnea (adult) (pediatric): Secondary | ICD-10-CM | POA: Diagnosis not present

## 2024-08-04 ENCOUNTER — Encounter (INDEPENDENT_AMBULATORY_CARE_PROVIDER_SITE_OTHER): Payer: Self-pay | Admitting: *Deleted

## 2024-08-12 NOTE — Progress Notes (Signed)
 [facility] - Clinic Note  08/18/2024    CHIEF COMPLAINT Patient presents for Retina Follow Up   HISTORY OF PRESENT ILLNESS: Michelle Morrison is a 63 y.o. female who presents to the clinic today for:   HPI     Retina Follow Up   Patient presents with  CRVO/BRVO.  In left eye.  Severity is moderate.  Duration of 6 weeks.  Since onset it is stable.  I, the attending physician,  performed the HPI with the patient and updated documentation appropriately.        Comments   6 week Retina eval. Patient states no vision changes noticed       Last edited by Valdemar Rogue, MD on 08/18/2024 12:34 PM.     Pt states the vision in the left eye is fuzzy.   Referring physician: [name], [name], MD [address] [place],  [place] [address]  HISTORICAL INFORMATION:   Selected notes from the MEDICAL RECORD NUMBER Referred by Dr. [name]: 05.08.23 Ocular Hx- NPDR OS  Patient saw Dr. [name].  Vision at that time was OD 20/20, OS 20/25    CURRENT MEDICATIONS: No current outpatient medications on file. (Ophthalmic Drugs)   No current facility-administered medications for this visit. (Ophthalmic Drugs)   Current Outpatient Medications (Other)  Medication Sig   albuterol  (VENTOLIN  HFA) 108 (90 Base) MCG/ACT inhaler Inhale 1-2 puffs into the lungs every 4 (four) hours as needed for wheezing or shortness of breath.   amiodarone  (PACERONE ) 200 MG tablet Take 1 tablet (200 mg total) Monday thru Friday NONE on Saturday and Sunday   atorvastatin  (LIPITOR ) 80 MG tablet Take 1 tablet (80 mg total) by mouth daily.   dapagliflozin  propanediol (FARXIGA ) 10 MG TABS tablet TAKE 1 TABLET (10 MG TOTAL) BY MOUTH DAILY.   diclofenac Sodium (VOLTAREN) 1 % GEL Apply 4 g topically daily as needed (stomach pain).   ENTRESTO  97-103 MG Take 1 tablet by mouth 2 (two) times daily.   furosemide  (LASIX ) 20 MG tablet Take 1 tablet (20 mg total) by mouth daily.   TRADJENTA   5 MG TABS tablet Take 5 mg by mouth daily.   hydrALAZINE  (APRESOLINE ) 50 MG tablet Take 1 tablet (50 mg total) by mouth 3 (three) times daily.   isosorbide  mononitrate (IMDUR ) 30 MG 24 hr tablet Take 1.5 tablets (45 mg total) by mouth daily.   No current facility-administered medications for this visit. (Other)   REVIEW OF SYSTEMS: ROS   Positive for: Endocrine, Cardiovascular, Eyes, Respiratory Negative for: Constitutional, Gastrointestinal, Neurological, Skin, Genitourinary, Musculoskeletal, HENT, Psychiatric, Allergic/Imm, Heme/Lymph Last edited by German Olam BRAVO, COT on 08/18/2024  8:45 AM.     ALLERGIES No Known Allergies  PAST MEDICAL HISTORY Past Medical History:  Diagnosis Date   Atrial tachycardia    CHF (congestive heart failure) (HCC)    a. EF 45% by echo in 03/2020 with normal cors by cath   Diabetes mellitus without complication (HCC)    Diarrhea    Heart disease    Herpes simplex infection    Hyperlipidemia    Hypertension    Past Surgical History:  Procedure Laterality Date   CESAREAN SECTION     CHOLECYSTECTOMY     COLONOSCOPY N/A 08/26/2014   Procedure: COLONOSCOPY;  Surgeon: [name], MD;  Location: [facility];  Service: Endoscopy;  Laterality: N/A;  9:30 AM - moved to 8:30 - Ginger to notify pt   LEFT  HEART CATH AND CORONARY ANGIOGRAPHY N/A 04/14/2020   Procedure: LEFT HEART CATH AND CORONARY ANGIOGRAPHY;  Surgeon: Swazil[place], [name], MD;  Location: [facility] INVASIVE CV LAB;  Service: Cardiovascular;  Laterality: N/A;   RIGHT/LEFT HEART CATH AND CORONARY ANGIOGRAPHY N/A 02/19/2021   Procedure: RIGHT/LEFT HEART CATH AND CORONARY ANGIOGRAPHY;  Surgeon: [name], MD;  Location: [facility] INVASIVE CV LAB;  Service: Cardiovascular;  Laterality: N/A;   SVT ABLATION N/A 09/30/2023   Procedure: SVT ABLATION;  Surgeon: [name], MD;  Location: [facility] INVASIVE CV LAB;  Service: Cardiovascular;  Laterality: N/A;   FAMILY HISTORY Family History  Problem Relation Age  of Onset   Hypertension Mother    Diabetes Mother    Colon cancer Neg Hx    SOCIAL HISTORY Social History   Tobacco Use   Smoking status: Never   Smokeless tobacco: Never  Vaping Use   Vaping status: Never Used  Substance Use Topics   Alcohol  use: No    Alcohol /week: 0.0 standard drinks of alcohol    Drug use: No       OPHTHALMIC EXAM:  Base Eye Exam     Visual Acuity (Snellen - Linear)       Right Left   Dist [place] 20/20 -1 20/40 -3   Dist ph [place]  20/NI         Tonometry (Tonopen, 8:48 AM)       Right Left   Pressure 18 18         Pupils       Dark Light Shape React APD   Right 3 2 Round Brisk None   Left 3 2 Round Brisk None         Visual Fields (Counting fingers)       Left Right    Full Full         Extraocular Movement       Right Left    Full, Ortho Full, Ortho         Neuro/Psych     Oriented x3: Yes   Mood/Affect: Normal         Dilation     Both eyes: 1.0% Mydriacyl, 2.5% Phenylephrine @ 8:48 AM           Slit Lamp and Fundus Exam     Slit Lamp Exam       Right Left   Lids/Lashes Dermatochalasis, upper lid chalazion, Meibomian gland dysfunction Dermatochalasis - upper lid, Meibomian gland dysfunction   Conjunctiva/Sclera melanosis melanosis   Cornea trace PEE trace PEE   Anterior Chamber deep, narrow temporal angle deep, narrow temporal angle   Iris Round and dilated, No NVI Round and dilated, No NVI   Lens 2+ Nuclear sclerosis, 2+ Cortical cataract 2+ Nuclear sclerosis, 2+ Cortical cataract   Anterior Vitreous Vitreous syneresis Vitreous syneresis         Fundus Exam       Right Left   Disc Pink and sharp, PPP Pink and sharp, PPP   C/D Ratio 0.6 0.6   Macula Flat, Good foveal reflex, no frank heme or edema Blunted foveal reflex, +central edema, cystic changes superior macula -- slightly improved, punctate perifoveal exudates, cluster of IRH superior macula -- improving   Vessels Attenuated, Tortuous  Attenuated, old BRVO with macroaneurysm superior macula, mild tortuosity   Periphery Attached, rare MA Attached, rare MA           Refraction     Manifest Refraction (Auto)  Sphere Cylinder Axis Dist VA   Right       Left -0.75 +0.25 177 20/30-2           IMAGING AND PROCEDURES  Imaging and Procedures for 08/18/2024  OCT, Retina - OU - Both Eyes       Right Eye Quality was poor. Central Foveal Thickness: 222. Progression has been stable. Findings include normal foveal contour, no IRF, no SRF, intraretinal hyper-reflective material, vitreomacular adhesion .   Left Eye Quality was good. Central Foveal Thickness: 214. Progression has worsened. Findings include no SRF, abnormal foveal contour, subretinal hyper-reflective material, intraretinal hyper-reflective material, intraretinal fluid, vitreomacular adhesion (Persistent IRF/IRHM superior and central macula-- slightly increased, central ORA and SRHM).   Notes *Images captured and stored on drive  Diagnosis / Impression:  OD: NFP; No IRF/SRF OS: BRVO w/ CME - Persistent IRF/IRHM superior and central macula-- slightly increased, central ORA and SRHM  Clinical management:  See below  Abbreviations: NFP - Normal foveal profile. CME - cystoid macular edema. PED - pigment epithelial detachment. IRF - intraretinal fluid. SRF - subretinal fluid. EZ - ellipsoid zone. ERM - epiretinal membrane. ORA - outer retinal atrophy. ORT - outer retinal tubulation. SRHM - subretinal hyper-reflective material. IRHM - intraretinal hyper-reflective material      Intravitreal Injection, Pharmacologic Agent - OS - Left Eye       Time Out 08/18/2024. 9:42 AM. Confirmed correct patient, procedure, site, and patient consented.   Anesthesia Topical anesthesia was used. Anesthetic medications included Lidocaine  2%, Proparacaine 0.5%.   Procedure Preparation included 5% betadine to ocular surface, eyelid speculum. A supplied (32g) needle  was used.   Injection: 6 mg faricimab -svoa 6 MG/0.05ML   Route: Intravitreal, Site: Left Eye   NDC: 49757-903-93, Lot: [id number], Expiration date: 08/24/2025, Waste: 0 mL   Post-op Post injection exam found visual acuity of at least counting fingers. The patient tolerated the procedure well. There were no complications. The patient received written and verbal post procedure care education. Post injection medications were not given.            ASSESSMENT/PLAN:    ICD-10-CM   1. Branch retinal vein occlusion of left eye with macular edema  H34.8320 OCT, Retina - OU - Both Eyes    Intravitreal Injection, Pharmacologic Agent - OS - Left Eye    faricimab -svoa (VABYSMO ) 6mg /0.6mL intravitreal injection    2. Essential hypertension  I10     3. Hypertensive retinopathy of both eyes  H35.033     4. Mild nonproliferative diabetic retinopathy of both eyes without macular edema associated with type 2 diabetes mellitus (HCC)  Z88.6706     5. Long term (current) use of oral hypoglycemic drugs  Z79.84     6. 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-related cataract of both eyes  H25.813      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, ?macroaneurysm - s/p IVA OS #1 (05.17.23), #2 (06.14.23), #3 (08.02.23), #4 (09.06.23), #5 (10.04.23), #6 (11.06.23) -- IVA resistance =============== - s/p IVE OS #1 (12.08.23), #2 (01.17.24), #3 (03.14.24), #4 (04.25.24), #5 (06.07.24), #6 (07.29.24), #7 (09.09.24), #8 (10.22.24), #9 (12.03.24), #10 (01.14.25), #11 (02.25.25) -- IVE resistance ================ - s/p IVV OS #1 (04.08.25), #2 (05.20.25), #3 (07.01.25), #4 (08.12.25) **h/o increased fluid OS at 7 wks -- noted on 07.29.24 visit** - BCVA OS 20/25 from 20/30  -  OCT OS: Persistent IRF/IRHM superior and central macula-- slightly increased, central ORA and SRHM at 6 weeks - recommend IVV OS #5  today, 09.24.25 w/ f/u dec to 5 wks - pt wishes to proceed with injection   - RBA of procedure discussed, questions answered - IVV informed consent obtained and signed 04.08.25 (OS) - see procedure note - F/U 5 weeks -- DFE/OCT/possible injection   2,3. Hypertensive retinopathy OU - discussed importance of tight BP control - monitor  4,5. Mild nonproliferative diabetic retinopathy w/o DME OD - exam shows rare MA OU  - FA (05.17.23) shows rare MA OU; no NV OU -- BRVO OS as above - OCT OD: NFP; Focal cystic changes and IRHM in temporal macula -- persistent - BCVA OD 20/20 from 20/25 - will continue to hold off on anti-VEGF therapy OD for now  - monitor  6. Mixed Cataract OU - The symptoms of cataract, surgical options, and treatments and risks were discussed with patient. - discussed diagnosis and progression - monitor  Ophthalmic Meds Ordered this visit:  Meds ordered this encounter  Medications   faricimab -svoa (VABYSMO ) 6mg /0.56mL intravitreal injection     Return in about 5 weeks (around 09/22/2024) for f/u, BRVO, DFE, OCT, Possible, IVV, OS.  There are no Patient Instructions on file for this visit.  This document serves as a record of services personally performed by Redell JUDITHANN Hans, MD, PhD. It was created on their behalf by Almetta Pesa, an ophthalmic technician. The creation of this record is the provider's dictation and/or activities during the visit.    Electronically signed by: Almetta Pesa, OA, 08/22/24  3:01 AM This document serves as a record of services personally performed by Redell JUDITHANN Hans, MD, PhD. It was created on their behalf by Wanda GEANNIE Keens, COT an ophthalmic technician. The creation of this record is the provider's dictation and/or activities during the visit.    Electronically signed by:  Wanda GEANNIE Keens, COT  08/22/24 3:01 AM  Redell JUDITHANN Hans, M.D., Ph.D. Diseases & Surgery of the Retina and Vitreous [facility]  I have reviewed the above documentation for accuracy and completeness, and I agree with the above. Redell JUDITHANN Hans, M.D., Ph.D. 08/22/24 3:03 AM   Abbreviations: M myopia (nearsighted); A astigmatism; H hyperopia (farsighted); P presbyopia; Mrx spectacle prescription;  CTL contact lenses; OD right eye; OS left eye; OU both eyes  XT exotropia; ET esotropia; PEK punctate epithelial keratitis; PEE punctate epithelial erosions; DES dry eye syndrome; MGD meibomian gland dysfunction; ATs artificial tears; PFAT's preservative free artificial tears; NSC nuclear sclerotic cataract; PSC posterior subcapsular cataract; ERM epi-retinal membrane; PVD posterior vitreous detachment; RD retinal detachment; DM diabetes mellitus; DR diabetic retinopathy; NPDR non-proliferative diabetic retinopathy; PDR proliferative diabetic retinopathy; CSME clinically significant macular edema; DME diabetic macular edema; dbh dot blot hemorrhages; CWS cotton wool spot; POAG primary open angle glaucoma; C/D cup-to-disc ratio; HVF humphrey visual field; GVF goldmann visual field; OCT optical coherence tomography; IOP intraocular pressure; BRVO Branch retinal vein occlusion; CRVO central retinal vein occlusion; CRAO central retinal artery occlusion; BRAO branch retinal artery occlusion; RT retinal tear; SB scleral buckle; PPV pars plana vitrectomy; VH Vitreous hemorrhage; PRP panretinal laser photocoagulation; IVK intravitreal kenalog; VMT vitreomacular traction; MH Macular hole;  NVD neovascularization of the disc; NVE neovascularization elsewhere; AREDS age related eye disease study; ARMD age related macular degeneration; POAG primary open angle glaucoma; EBMD epithelial/anterior basement membrane dystrophy; ACIOL anterior chamber intraocular lens; IOL intraocular lens;  PCIOL posterior chamber intraocular lens; Phaco/IOL phacoemulsification with intraocular lens placement; PRK photorefractive keratectomy; LASIK laser assisted in situ  keratomileusis; HTN hypertension; DM diabetes mellitus; COPD chronic obstructive pulmonary disease

## 2024-08-13 ENCOUNTER — Encounter: Payer: Self-pay | Admitting: Primary Care

## 2024-08-13 ENCOUNTER — Ambulatory Visit (INDEPENDENT_AMBULATORY_CARE_PROVIDER_SITE_OTHER): Admitting: Primary Care

## 2024-08-13 VITALS — BP 150/60 | HR 58 | Temp 97.4°F | Ht 66.0 in | Wt 188.6 lb

## 2024-08-13 DIAGNOSIS — G4733 Obstructive sleep apnea (adult) (pediatric): Secondary | ICD-10-CM | POA: Diagnosis not present

## 2024-08-13 DIAGNOSIS — R0683 Snoring: Secondary | ICD-10-CM

## 2024-08-13 DIAGNOSIS — I10 Essential (primary) hypertension: Secondary | ICD-10-CM | POA: Diagnosis not present

## 2024-08-13 NOTE — Patient Instructions (Addendum)
  VISIT SUMMARY: Today, we discussed your ongoing issues with CPAP mask fit and compliance, as well as your blood pressure management. You have made significant improvements in using your CPAP machine, but you are experiencing issues with the headgear. Additionally, you have adjusted your blood pressure medication dosage due to dizziness, but your blood pressure remains slightly elevated.  YOUR PLAN: -OBSTRUCTIVE SLEEP APNEA: Obstructive sleep apnea is a condition where your breathing stops and starts repeatedly during sleep. You have been using a CPAP machine to help manage this condition, and your compliance has improved significantly. However, you are experiencing issues with the headgear for your nasal pillow mask. You should bring your current mask and nasal pillow mask to the medical supply store to address the headgear issue. Continue using your CPAP every night.  -HYPERTENSION: Hypertension, or high blood pressure, is a condition where the force of the blood against your artery walls is too high. You have been prescribed hydralazine  to manage your blood pressure, but you experienced dizziness with the original dosage and have adjusted it yourself. You should inform your primary care physician about this adjustment, as your blood pressure remains slightly elevated.  INSTRUCTIONS: Please visit the medical supply store with your current mask and nasal pillow mask to address the headgear issue. Continue using your CPAP every night. Inform your primary care physician about the dose adjustment of hydralazine .  Follow-up: 6 months with Beth NP for CPAP compliance

## 2024-08-13 NOTE — Progress Notes (Signed)
 @Patient  ID: Michelle Morrison, female    DOB: 09/03/1961, 63 y.o.   MRN: [id number]  Chief Complaint  Patient presents with   Obstructive Sleep Apnea    CPAP f/u     Referring provider: Carlette Benita Area*  HPI: 63 year old female, never smoked. PMH significant for CHF, HTN, aflutter, type 2 diabetes, CKD.   Sleep testing 01/30/24 HST>> Moderate OSA, AHI 17.5/hour with SpO2 low 76%   Previous [facility] pulmonary encounter:  06/16/2024 Discussed the use of AI scribe software for clinical note transcription with the patient, who gave verbal consent to proceed.  History of Present Illness   Michelle Morrison is a 63 year old female with moderate obstructive sleep apnea who presents for a follow-up after starting CPAP therapy.  She underwent a sleep study in March 2025, which revealed moderate obstructive sleep apnea. She was subsequently started on CPAP therapy and this is her first follow-up visit since initiating the treatment.  She uses the CPAP machine almost every night and states that it helps her significantly at night. However, compliance is about 30% of the time, with usage for more than four hours on 22% of the nights. She forgets to use the machine on some nights due to tiredness.  The CPAP therapy has improved her breathing, sleep quality, and energy levels, allowing her to walk better and feel less tired. No issues with the CPAP machine itself, but she mentions needing more supplies for the machine, such as the lung thing, which she changes every two weeks.  Her past medical history is significant for atrial fibrillation, supraventricular tachycardia, congestive heart failure, and hypertension.     Airview download 04/15/24-06/13/24 Usage 18/60 (30%); 13/60 (22%) > 4 hours Average usage days used 4 hours 25 mins Pressure 5-15cm h20 (14.7cm h20-95%) Airleaks 10.8L/min AHI 1.3  08/13/2024- Interim hx  Discussed the use of AI scribe software for clinical note transcription with  the patient, who gave verbal consent to proceed.  History of Present Illness Michelle Morrison is a 63 year old female with moderate sleep apnea who presents with issues related to CPAP mask fit and compliance.  She has a history of moderate sleep apnea, diagnosed following a sleep study in March 2025. CPAP therapy was initiated, and initially, compliance was low at 30%. However, she has since improved significantly, now using the CPAP 90% of the time, with 77% of nights achieving more than four hours of use. Her current apnea score is 2.2, and her CPAP pressure settings are 5 to 15 cm H2O.  She reports issues with the CPAP mask, specifically the headgear. She received a nasal pillow mask, the Airfit P10, but lacks the appropriate headgear to secure it properly. She describes the current headgear as inadequate, preferred rubber type that fits around her head vs plastic strap. Despite these issues, she is using the nasal pillow mask every night and reports improved sleep quality, stating she can 'sleep at night' and feels less tired during the day.  Regarding her blood pressure management, she is prescribed hydralazine  50 mg to be taken three times a day. However, she experiences dizziness with this dosage and has self-adjusted to taking 25 mg three times a day, which she tolerates better. She notes that her blood pressure remains slightly elevated despite this adjustment.  Airview download 07/13/24-08/11/24 Usage days 27/30 days; 23 days (77%) > 4 hours Average usage 5 hours 5 mins   Pressure 5-15cm h20 (14.6cm h20-95%) Airleaks 14.6L/min (95%)  Ahi 2.2    No Known Allergies  Immunization History  Administered Date(s) Administered   Influenza-Unspecified 11/25/2018   Moderna Sars-Covid-2 Vaccination 02/17/2020, 03/16/2020    Past Medical History:  Diagnosis Date   Atrial tachycardia (HCC)    CHF (congestive heart failure) (HCC)    a. EF 45% by echo in 03/2020 with normal cors by cath    Diabetes mellitus without complication (HCC)    Diarrhea    Heart disease    Herpes simplex infection    Hyperlipidemia    Hypertension     Tobacco History: Social History   Tobacco Use  Smoking Status Never  Smokeless Tobacco Never   Counseling given: Not Answered   Outpatient Medications Prior to Visit  Medication Sig Dispense Refill   albuterol  (VENTOLIN  HFA) 108 (90 Base) MCG/ACT inhaler Inhale 1-2 puffs into the lungs every 4 (four) hours as needed for wheezing or shortness of breath.     amiodarone  (PACERONE ) 200 MG tablet Take 1 tablet (200 mg total) Monday thru Friday NONE on Saturday and Sunday 90 tablet 3   atorvastatin  (LIPITOR ) 80 MG tablet Take 1 tablet (80 mg total) by mouth daily. 90 tablet 3   dapagliflozin  propanediol (FARXIGA ) 10 MG TABS tablet TAKE 1 TABLET (10 MG TOTAL) BY MOUTH DAILY. 90 tablet 3   diclofenac Sodium (VOLTAREN) 1 % GEL Apply 4 g topically daily as needed (stomach pain).     ENTRESTO  97-103 MG Take 1 tablet by mouth 2 (two) times daily.     furosemide  (LASIX ) 20 MG tablet Take 1 tablet (20 mg total) by mouth daily. 90 tablet 3   hydrALAZINE  (APRESOLINE ) 50 MG tablet Take 1 tablet (50 mg total) by mouth 3 (three) times daily. 270 tablet 3   isosorbide  mononitrate (IMDUR ) 30 MG 24 hr tablet Take 1.5 tablets (45 mg total) by mouth daily. 135 tablet 3   TRADJENTA  5 MG TABS tablet Take 5 mg by mouth daily.     No facility-administered medications prior to visit.   Review of Systems  Review of Systems  Constitutional: Negative.   Respiratory: Negative.     Physical Exam  BP (!) 156/61   Pulse (!) 58   Temp (!) 97.4 F (36.3 C)   Ht 5' 6 (1.676 m)   Wt 188 lb 9.6 oz (85.5 kg)   LMP 05/23/2007   SpO2 97% Comment: RA  BMI 30.44 kg/m  Physical Exam Constitutional:      Appearance: Normal appearance. She is well-developed.  HENT:     Head: Normocephalic and atraumatic.     Mouth/Throat:     Mouth: Mucous membranes are moist.      Pharynx: Oropharynx is clear.  Eyes:     Pupils: Pupils are equal, round, and reactive to light.  Cardiovascular:     Rate and Rhythm: Normal rate and regular rhythm.     Heart sounds: Normal heart sounds. No murmur heard. Pulmonary:     Effort: Pulmonary effort is normal. No respiratory distress.     Breath sounds: Normal breath sounds. No wheezing or rhonchi.  Abdominal:     General: Bowel sounds are normal.     Palpations: Abdomen is soft.     Tenderness: There is no abdominal tenderness.  Musculoskeletal:        General: Normal range of motion.     Cervical back: Normal range of motion and neck supple.  Skin:    General: Skin is warm and dry.  Findings: No erythema or rash.  Neurological:     General: No focal deficit present.     Mental Status: She is alert and oriented to person, place, and time. Mental status is at baseline.  Psychiatric:        Mood and Affect: Mood normal.        Behavior: Behavior normal.        Thought Content: Thought content normal.        Judgment: Judgment normal.     Lab Results:  CBC    Component Value Date/Time   WBC 7.3 02/11/2024 1532   RBC 4.48 02/11/2024 1532   HGB 13.8 02/11/2024 1532   HCT 42.5 02/11/2024 1532   PLT 285 02/11/2024 1532   MCV 94.9 02/11/2024 1532   MCH 30.8 02/11/2024 1532   MCHC 32.5 02/11/2024 1532   RDW 15.3 02/11/2024 1532   LYMPHSABS 1.9 02/11/2024 1532   MONOABS 0.6 02/11/2024 1532   EOSABS 0.2 02/11/2024 1532   BASOSABS 0.1 02/11/2024 1532    BMET    Component Value Date/Time   NA 138 02/11/2024 1532   K 4.0 02/11/2024 1532   CL 99 02/11/2024 1532   CO2 27 02/11/2024 1532   GLUCOSE 80 02/11/2024 1532   BUN 25 (H) 02/11/2024 1532   CREATININE 1.19 (H) 02/11/2024 1532   CALCIUM  9.1 02/11/2024 1532   GFRNONAA 52 (L) 02/11/2024 1532   GFRAA >60 06/10/2020 0640    BNP    Component Value Date/Time   BNP 3,202.0 (H) 09/09/2023 1750    ProBNP No results found for:  PROBNP  Imaging: No results found.   Assessment & Plan:   1. Snoring (Primary)  2. OSA (obstructive sleep apnea)  3. HTN (hypertension), benign  Assessment and Plan Assessment & Plan Obstructive sleep apnea Patient has moderate obstructive sleep apnea. CPAP therapy initiated to reduce cardiac complications. Initial compliance was 30%, now improved to 90% usage and 77% usage for more than four hours. Current pressure 5-15cm h20 with residual apnea score is 2.2, indicating effective management. Nasal pillow mask used, but headgear not fitting properly. - Provide contact information and address for the medical supply store - Instruct to bring current mask and nasal pillow mask to the medical supply store to address headgear issue - Advise to continue using CPAP every nightly 4- 6 hours or longer - Continue to encourage weight loss efforts   Hypertension Blood pressure today was 150/68 mmHg. Prescribed hydralazine  50 mg three times a day, but experienced dizziness and reduced dose to 25 mg three times a day. Lower dose better tolerated, but blood pressure remains slightly elevated. - Advise to inform primary care physician about the dose adjustment of hydralazine    Michelle LELON Ferrari, NP 08/13/2024

## 2024-08-16 DIAGNOSIS — Z23 Encounter for immunization: Secondary | ICD-10-CM | POA: Diagnosis not present

## 2024-08-16 DIAGNOSIS — E1122 Type 2 diabetes mellitus with diabetic chronic kidney disease: Secondary | ICD-10-CM | POA: Diagnosis not present

## 2024-08-16 DIAGNOSIS — E785 Hyperlipidemia, unspecified: Secondary | ICD-10-CM | POA: Diagnosis not present

## 2024-08-16 DIAGNOSIS — I10 Essential (primary) hypertension: Secondary | ICD-10-CM | POA: Diagnosis not present

## 2024-08-16 DIAGNOSIS — I5042 Chronic combined systolic (congestive) and diastolic (congestive) heart failure: Secondary | ICD-10-CM | POA: Diagnosis not present

## 2024-08-17 DIAGNOSIS — Z0001 Encounter for general adult medical examination with abnormal findings: Secondary | ICD-10-CM | POA: Diagnosis not present

## 2024-08-17 DIAGNOSIS — E1122 Type 2 diabetes mellitus with diabetic chronic kidney disease: Secondary | ICD-10-CM | POA: Diagnosis not present

## 2024-08-17 DIAGNOSIS — I169 Hypertensive crisis, unspecified: Secondary | ICD-10-CM | POA: Diagnosis not present

## 2024-08-18 ENCOUNTER — Ambulatory Visit (INDEPENDENT_AMBULATORY_CARE_PROVIDER_SITE_OTHER): Admitting: Ophthalmology

## 2024-08-18 ENCOUNTER — Encounter (INDEPENDENT_AMBULATORY_CARE_PROVIDER_SITE_OTHER): Payer: Self-pay | Admitting: Ophthalmology

## 2024-08-18 ENCOUNTER — Other Ambulatory Visit (HOSPITAL_COMMUNITY): Payer: Self-pay | Admitting: Internal Medicine

## 2024-08-18 DIAGNOSIS — Z7984 Long term (current) use of oral hypoglycemic drugs: Secondary | ICD-10-CM

## 2024-08-18 DIAGNOSIS — H35033 Hypertensive retinopathy, bilateral: Secondary | ICD-10-CM

## 2024-08-18 DIAGNOSIS — H34832 Tributary (branch) retinal vein occlusion, left eye, with macular edema: Secondary | ICD-10-CM | POA: Diagnosis not present

## 2024-08-18 DIAGNOSIS — I10 Essential (primary) hypertension: Secondary | ICD-10-CM | POA: Diagnosis not present

## 2024-08-18 DIAGNOSIS — H25813 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-related cataract, bilateral: Secondary | ICD-10-CM | POA: Diagnosis not present

## 2024-08-18 DIAGNOSIS — Z1231 Encounter for screening mammogram for malignant neoplasm of breast: Secondary | ICD-10-CM

## 2024-08-18 DIAGNOSIS — E113293 Type 2 diabetes mellitus with mild nonproliferative diabetic retinopathy without macular edema, bilateral: Secondary | ICD-10-CM | POA: Diagnosis not present

## 2024-08-18 MED ORDER — FARICIMAB-SVOA 6 MG/0.05ML IZ SOSY
6.0000 mg | PREFILLED_SYRINGE | INTRAVITREAL | Status: AC | PRN
  Administered 2024-08-18: 6 mg via INTRAVITREAL

## 2024-09-03 ENCOUNTER — Ambulatory Visit: Admitting: Obstetrics & Gynecology

## 2024-09-14 NOTE — Progress Notes (Signed)
 [facility] - Clinic Note  09/22/2024    CHIEF COMPLAINT Patient presents for Retina Follow Up   HISTORY OF PRESENT ILLNESS: Michelle Morrison is a 63 y.o. female who presents to the clinic today for:   HPI     Retina Follow Up   Patient presents with  CRVO/BRVO.  In left eye.  Severity is moderate.  Duration of 5 weeks.  Since onset it is stable.  I, the attending physician,  performed the HPI with the patient and updated documentation appropriately.        Comments   5 week Retina eval. Patient states vision is about the same      Last edited by Valdemar Rogue, MD on 09/22/2024 12:50 PM.      Pt states she's doing well, vision seems stable.   Referring physician: [name], [name], MD [address] [place],  [place] [address]  HISTORICAL INFORMATION:   Selected notes from the MEDICAL RECORD NUMBER Referred by Dr. [name]: 05.08.23 Ocular Hx- NPDR OS  Patient saw Dr. [name].  Vision at that time was OD 20/20, OS 20/25    CURRENT MEDICATIONS: No current outpatient medications on file. (Ophthalmic Drugs)   No current facility-administered medications for this visit. (Ophthalmic Drugs)   Current Outpatient Medications (Other)  Medication Sig   albuterol  (VENTOLIN  HFA) 108 (90 Base) MCG/ACT inhaler Inhale 1-2 puffs into the lungs every 4 (four) hours as needed for wheezing or shortness of breath.   amiodarone  (PACERONE ) 200 MG tablet Take 1 tablet (200 mg total) Monday thru Friday NONE on Saturday and Sunday   atorvastatin  (LIPITOR ) 80 MG tablet Take 1 tablet (80 mg total) by mouth daily.   dapagliflozin  propanediol (FARXIGA ) 10 MG TABS tablet TAKE 1 TABLET (10 MG TOTAL) BY MOUTH DAILY.   diclofenac Sodium (VOLTAREN) 1 % GEL Apply 4 g topically daily as needed (stomach pain).   ENTRESTO  97-103 MG Take 1 tablet by mouth 2 (two) times daily.   furosemide  (LASIX ) 20 MG tablet Take 1 tablet (20 mg total) by mouth daily.    TRADJENTA  5 MG TABS tablet Take 5 mg by mouth daily.   hydrALAZINE  (APRESOLINE ) 50 MG tablet Take 1 tablet (50 mg total) by mouth 3 (three) times daily.   isosorbide  mononitrate (IMDUR ) 30 MG 24 hr tablet Take 1.5 tablets (45 mg total) by mouth daily.   No current facility-administered medications for this visit. (Other)   REVIEW OF SYSTEMS: ROS   Positive for: Endocrine, Cardiovascular, Eyes, Respiratory Negative for: Constitutional, Gastrointestinal, Neurological, Skin, Genitourinary, Musculoskeletal, HENT, Psychiatric, Allergic/Imm, Heme/Lymph Last edited by German Olam BRAVO, COT on 09/22/2024  7:57 AM.      ALLERGIES No Known Allergies  PAST MEDICAL HISTORY Past Medical History:  Diagnosis Date   Atrial tachycardia    CHF (congestive heart failure) (HCC)    a. EF 45% by echo in 03/2020 with normal cors by cath   Diabetes mellitus without complication (HCC)    Diarrhea    Heart disease    Herpes simplex infection    Hyperlipidemia    Hypertension    Past Surgical History:  Procedure Laterality Date   CESAREAN SECTION     CHOLECYSTECTOMY     COLONOSCOPY N/A 08/26/2014   Procedure: COLONOSCOPY;  Surgeon: [name], MD;  Location: [facility];  Service: Endoscopy;  Laterality: N/A;  9:30 AM - moved to 8:30 - Ginger to notify pt   LEFT  HEART CATH AND CORONARY ANGIOGRAPHY N/A 04/14/2020   Procedure: LEFT HEART CATH AND CORONARY ANGIOGRAPHY;  Surgeon: [name], [name], MD;  Location: [facility] INVASIVE CV LAB;  Service: Cardiovascular;  Laterality: N/A;   RIGHT/LEFT HEART CATH AND CORONARY ANGIOGRAPHY N/A 02/19/2021   Procedure: RIGHT/LEFT HEART CATH AND CORONARY ANGIOGRAPHY;  Surgeon: [name], MD;  Location: [facility] INVASIVE CV LAB;  Service: Cardiovascular;  Laterality: N/A;   SVT ABLATION N/A 09/30/2023   Procedure: SVT ABLATION;  Surgeon: [name], MD;  Location: [facility] INVASIVE CV LAB;  Service: Cardiovascular;  Laterality: N/A;   FAMILY HISTORY Family History  Problem  Relation Age of Onset   Hypertension Mother    Diabetes Mother    Colon cancer Neg Hx    SOCIAL HISTORY Social History   Tobacco Use   Smoking status: Never   Smokeless tobacco: Never  Vaping Use   Vaping status: Never Used  Substance Use Topics   Alcohol  use: No    Alcohol /week: 0.0 standard drinks of alcohol    Drug use: No       OPHTHALMIC EXAM:  Base Eye Exam     Visual Acuity (Snellen - Linear)       Right Left   Dist [place] 20/20 -2 20/40   Dist ph [place]  20/NI         Tonometry (Tonopen, 7:59 AM)       Right Left   Pressure 16 20         Pupils       Dark Light Shape React APD   Right 3 2 Round Brisk None   Left 3 2 Round Brisk None         Visual Fields (Counting fingers)       Left Right    Full Full         Extraocular Movement       Right Left    Full, Ortho Full, Ortho         Neuro/Psych     Oriented x3: Yes   Mood/Affect: Normal         Dilation     Both eyes: 1.0% Mydriacyl, 2.5% Phenylephrine @ 7:59 AM           Slit Lamp and Fundus Exam     Slit Lamp Exam       Right Left   Lids/Lashes Dermatochalasis, upper lid chalazion, Meibomian gland dysfunction Dermatochalasis - upper lid, Meibomian gland dysfunction   Conjunctiva/Sclera melanosis melanosis   Cornea trace PEE trace PEE   Anterior Chamber deep, narrow temporal angle deep, narrow temporal angle   Iris Round and dilated, No NVI Round and dilated, No NVI   Lens 2+ Nuclear sclerosis, 2+ Cortical cataract 2+ Nuclear sclerosis, 2+ Cortical cataract   Anterior Vitreous Vitreous syneresis Vitreous syneresis         Fundus Exam       Right Left   Disc Pink and sharp, PPP Pink and sharp, PPP   C/D Ratio 0.6 0.6   Macula Flat, Good foveal reflex, no frank heme or edema Blunted foveal reflex, +central edema, cystic changes superior macula, punctate perifoveal exudates, cluster of IRH superior macula   Vessels Attenuated, Tortuous Attenuated, old BRVO with  macroaneurysm superior macula, mild tortuosity   Periphery Attached, rare MA Attached, rare MA           IMAGING AND PROCEDURES  Imaging and Procedures for 09/22/2024  OCT, Retina - OU - Both Eyes  Right Eye Quality was poor. Central Foveal Thickness: 224. Progression has been stable. Findings include normal foveal contour, no IRF, no SRF, intraretinal hyper-reflective material, vitreomacular adhesion .   Left Eye Quality was good. Central Foveal Thickness: 212. Progression has been stable. Findings include no SRF, abnormal foveal contour, subretinal hyper-reflective material, intraretinal hyper-reflective material, intraretinal fluid, vitreomacular adhesion (Persistent IRF/IRHM superior and central macula, central ORA and SRHM).   Notes *Images captured and stored on drive  Diagnosis / Impression:  OD: NFP; No IRF/SRF OS: BRVO w/ CME - Persistent IRF/IRHM superior and central macula, central ORA and SRHM  Clinical management:  See below  Abbreviations: NFP - Normal foveal profile. CME - cystoid macular edema. PED - pigment epithelial detachment. IRF - intraretinal fluid. SRF - subretinal fluid. EZ - ellipsoid zone. ERM - epiretinal membrane. ORA - outer retinal atrophy. ORT - outer retinal tubulation. SRHM - subretinal hyper-reflective material. IRHM - intraretinal hyper-reflective material      Intravitreal Injection, Pharmacologic Agent - OS - Left Eye       Time Out 09/22/2024. 8:20 AM. Confirmed correct patient, procedure, site, and patient consented.   Anesthesia Topical anesthesia was used. Anesthetic medications included Lidocaine  2%, Proparacaine 0.5%.   Procedure Preparation included 5% betadine to ocular surface, eyelid speculum. A supplied (32g) needle was used.   Injection: 6 mg faricimab -svoa 6 MG/0.05ML   Route: Intravitreal, Site: Left Eye   NDC: 49757-903-93, Lot: [id number], Expiration date: 11/23/2025, Waste: 0 mL   Post-op Post injection exam  found visual acuity of at least counting fingers. The patient tolerated the procedure well. There were no complications. The patient received written and verbal post procedure care education. Post injection medications were not given.             ASSESSMENT/PLAN:    ICD-10-CM   1. Branch retinal vein occlusion of left eye with macular edema (HCC)  H34.8320 OCT, Retina - OU - Both Eyes    Intravitreal Injection, Pharmacologic Agent - OS - Left Eye    faricimab -svoa (VABYSMO ) 6mg /0.33mL intravitreal injection    2. Essential hypertension  I10     3. Hypertensive retinopathy of both eyes  H35.033     4. Mild nonproliferative diabetic retinopathy of both eyes without macular edema associated with type 2 diabetes mellitus (HCC)  Z88.6706     5. Long term (current) use of oral hypoglycemic drugs  Z79.84     6. 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-related cataract of both eyes  H25.813       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, ?macroaneurysm - s/p IVA OS #1 (05.17.23), #2 (06.14.23), #3 (08.02.23), #4 (09.06.23), #5 (10.04.23), #6 (11.06.23) -- IVA resistance =============== - s/p IVE OS #1 (12.08.23), #2 (01.17.24), #3 (03.14.24), #4 (04.25.24), #5 (06.07.24), #6 (07.29.24), #7 (09.09.24), #8 (10.22.24), #9 (12.03.24), #10 (01.14.25), #11 (02.25.25) -- IVE resistance ================ - s/p IVV OS #1 (04.08.25), #2 (05.20.25), #3 (07.01.25), #4 (08.12.25), #5 (09.24.25) **h/o increased fluid OS at 7 wks -- noted on 07.29.24 visit** - BCVA OS 20/40 stable - OCT OS: Persistent IRF/IRHM superior and central macula, central ORA and SRHM, central ORA and SRHM at 5 weeks - recommend IVV OS #6 today, 10.29.25 w/ f/u in 5 wks - pt wishes to proceed with injection   - RBA of procedure discussed, questions answered - IVV informed consent obtained  and signed 04.08.25 (OS) -  see procedure note - F/U 5 weeks -- DFE/OCT/possible injection   2,3. Hypertensive retinopathy OU - discussed importance of tight BP control - monitor  4,5. Mild nonproliferative diabetic retinopathy w/o DME OD - exam shows rare MA OU  - FA (05.17.23) shows rare MA OU; no NV OU -- BRVO OS as above - OCT OD: NFP; Focal cystic changes and IRHM in temporal macula -- persistent - BCVA OD 20/20 from 20/25 - will continue to hold off on anti-VEGF therapy OD for now  - monitor  6. Mixed Cataract OU - The symptoms of cataract, surgical options, and treatments and risks were discussed with patient. - discussed diagnosis and progression - monitor  Ophthalmic Meds Ordered this visit:  Meds ordered this encounter  Medications   faricimab -svoa (VABYSMO ) 6mg /0.85mL intravitreal injection     Return in about 5 weeks (around 10/27/2024) for BRVO OS, DFE, OCT, Possible Injxn.  There are no Patient Instructions on file for this visit.  This document serves as a record of services personally performed by Redell JUDITHANN Hans, MD, PhD. It was created on their behalf by Almetta Pesa, an ophthalmic technician. The creation of this record is the provider's dictation and/or activities during the visit.    Electronically signed by: Almetta Pesa, OA, 09/26/24  2:19 PM   Redell JUDITHANN Hans, M.D., Ph.D. Diseases & Surgery of the Retina and Vitreous [facility]  I have reviewed the above documentation for accuracy and completeness, and I agree with the above. Redell JUDITHANN Hans, M.D., Ph.D. 09/26/24 2:20 PM   Abbreviations: M myopia (nearsighted); A astigmatism; H hyperopia (farsighted); P presbyopia; Mrx spectacle prescription;  CTL contact lenses; OD right eye; OS left eye; OU both eyes  XT exotropia; ET esotropia; PEK punctate epithelial keratitis; PEE punctate epithelial erosions; DES dry eye syndrome; MGD meibomian gland dysfunction; ATs artificial tears; PFAT's preservative free  artificial tears; NSC nuclear sclerotic cataract; PSC posterior subcapsular cataract; ERM epi-retinal membrane; PVD posterior vitreous detachment; RD retinal detachment; DM diabetes mellitus; DR diabetic retinopathy; NPDR non-proliferative diabetic retinopathy; PDR proliferative diabetic retinopathy; CSME clinically significant macular edema; DME diabetic macular edema; dbh dot blot hemorrhages; CWS cotton wool spot; POAG primary open angle glaucoma; C/D cup-to-disc ratio; HVF humphrey visual field; GVF goldmann visual field; OCT optical coherence tomography; IOP intraocular pressure; BRVO Branch retinal vein occlusion; CRVO central retinal vein occlusion; CRAO central retinal artery occlusion; BRAO branch retinal artery occlusion; RT retinal tear; SB scleral buckle; PPV pars plana vitrectomy; VH Vitreous hemorrhage; PRP panretinal laser photocoagulation; IVK intravitreal kenalog; VMT vitreomacular traction; MH Macular hole;  NVD neovascularization of the disc; NVE neovascularization elsewhere; AREDS age related eye disease study; ARMD age related macular degeneration; POAG primary open angle glaucoma; EBMD epithelial/anterior basement membrane dystrophy; ACIOL anterior chamber intraocular lens; IOL intraocular lens; PCIOL posterior chamber intraocular lens; Phaco/IOL phacoemulsification with intraocular lens placement; PRK photorefractive keratectomy; LASIK laser assisted in situ keratomileusis; HTN hypertension; DM diabetes mellitus; COPD chronic obstructive pulmonary disease

## 2024-09-15 DIAGNOSIS — I5042 Chronic combined systolic (congestive) and diastolic (congestive) heart failure: Secondary | ICD-10-CM | POA: Diagnosis not present

## 2024-09-15 DIAGNOSIS — I10 Essential (primary) hypertension: Secondary | ICD-10-CM | POA: Diagnosis not present

## 2024-09-17 ENCOUNTER — Other Ambulatory Visit (HOSPITAL_COMMUNITY): Payer: Self-pay | Admitting: Internal Medicine

## 2024-09-17 DIAGNOSIS — Z1231 Encounter for screening mammogram for malignant neoplasm of breast: Secondary | ICD-10-CM

## 2024-09-22 ENCOUNTER — Encounter (INDEPENDENT_AMBULATORY_CARE_PROVIDER_SITE_OTHER): Payer: Self-pay | Admitting: Ophthalmology

## 2024-09-22 ENCOUNTER — Ambulatory Visit (INDEPENDENT_AMBULATORY_CARE_PROVIDER_SITE_OTHER): Admitting: Ophthalmology

## 2024-09-22 DIAGNOSIS — Z7984 Long term (current) use of oral hypoglycemic drugs: Secondary | ICD-10-CM

## 2024-09-22 DIAGNOSIS — I10 Essential (primary) hypertension: Secondary | ICD-10-CM | POA: Diagnosis not present

## 2024-09-22 DIAGNOSIS — E113293 Type 2 diabetes mellitus with mild nonproliferative diabetic retinopathy without macular edema, bilateral: Secondary | ICD-10-CM

## 2024-09-22 DIAGNOSIS — H35033 Hypertensive retinopathy, bilateral: Secondary | ICD-10-CM | POA: Diagnosis not present

## 2024-09-22 DIAGNOSIS — H25813 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-related cataract, bilateral: Secondary | ICD-10-CM

## 2024-09-22 DIAGNOSIS — H34832 Tributary (branch) retinal vein occlusion, left eye, with macular edema: Secondary | ICD-10-CM

## 2024-09-22 MED ORDER — FARICIMAB-SVOA 6 MG/0.05ML IZ SOSY
6.0000 mg | PREFILLED_SYRINGE | INTRAVITREAL | Status: AC | PRN
  Administered 2024-09-22: 6 mg via INTRAVITREAL

## 2024-09-23 ENCOUNTER — Inpatient Hospital Stay (HOSPITAL_COMMUNITY): Admission: RE | Admit: 2024-09-23 | Source: Ambulatory Visit

## 2024-10-05 ENCOUNTER — Telehealth: Payer: Self-pay

## 2024-10-05 NOTE — Telephone Encounter (Signed)
 Who is your primary care physician: Dr.[name]  Reasons for the colonoscopy: screening  Have you had a colonoscopy before?  Yes 2015  Do you have family history of colon cancer? no  Previous colonoscopy with polyps removed? no  Do you have a history colorectal cancer?   no  Are you diabetic? If yes, Type 1 or Type 2?    Yes type 2  Do you have a prosthetic or mechanical heart valve? no  Do you have a pacemaker/defibrillator?   no  Have you had endocarditis/atrial fibrillation? no  Have you had joint replacement within the last 12 months?  no  Do you tend to be constipated or have to use laxatives? no  Do you have any history of drugs or alchohol?  no  Do you use supplemental oxygen ?  no  Have you had a stroke or heart attack within the last 6 months? no  Do you take weight loss medication?  no  For female patients: have you had a hysterectomy?  no                                     are you post menopausal?       no                                            do you still have your menstrual cycle? no      Do you take any blood-thinning medications such as: (aspirin , warfarin, Plavix, Aggrenox)  no  If yes we need the name, milligram, dosage and who is prescribing doctor  Current Outpatient Medications on File Prior to Visit  Medication Sig Dispense Refill   albuterol  (VENTOLIN  HFA) 108 (90 Base) MCG/ACT inhaler Inhale 1-2 puffs into the lungs every 4 (four) hours as needed for wheezing or shortness of breath.     amiodarone  (PACERONE ) 200 MG tablet Take 1 tablet (200 mg total) Monday thru Friday NONE on Saturday and Sunday 90 tablet 3   atorvastatin  (LIPITOR ) 80 MG tablet Take 1 tablet (80 mg total) by mouth daily. 90 tablet 3   dapagliflozin  propanediol (FARXIGA ) 10 MG TABS tablet Take by mouth daily.     ENTRESTO  97-103 MG Take 1 tablet by mouth 2 (two) times daily.     furosemide  (LASIX ) 20 MG tablet Take 1 tablet (20 mg total) by mouth daily. 90 tablet 3   hydrALAZINE   (APRESOLINE ) 50 MG tablet Take 1 tablet (50 mg total) by mouth 3 (three) times daily. 270 tablet 3   isosorbide  mononitrate (IMDUR ) 30 MG 24 hr tablet Take 1.5 tablets (45 mg total) by mouth daily. 135 tablet 3   TRADJENTA  5 MG TABS tablet Take 5 mg by mouth daily.     No current facility-administered medications on file prior to visit.    No Known Allergies   Pharmacy: [facility]  Primary Insurance Name: [facility] [id number]  Best number where you can be reached: [phone]

## 2024-10-06 ENCOUNTER — Ambulatory Visit (HOSPITAL_COMMUNITY)

## 2024-10-14 NOTE — Progress Notes (Shared)
 [facility] - Clinic Note  10/27/2024    CHIEF COMPLAINT Patient presents for No chief complaint on file.   HISTORY OF PRESENT ILLNESS: Michelle Morrison is a 63 y.o. female who presents to the clinic today for:      Pt states she's doing well, vision seems stable.   Referring physician: [name], [name], MD [address] [place],  [place] [address]  HISTORICAL INFORMATION:   Selected notes from the MEDICAL RECORD NUMBER Referred by Dr. [name]: 05.08.23 Ocular Hx- NPDR OS  Patient saw Dr. [name].  Vision at that time was OD 20/20, OS 20/25    CURRENT MEDICATIONS: No current outpatient medications on file. (Ophthalmic Drugs)   No current facility-administered medications for this visit. (Ophthalmic Drugs)   Current Outpatient Medications (Other)  Medication Sig   albuterol  (VENTOLIN  HFA) 108 (90 Base) MCG/ACT inhaler Inhale 1-2 puffs into the lungs every 4 (four) hours as needed for wheezing or shortness of breath.   amiodarone  (PACERONE ) 200 MG tablet Take 1 tablet (200 mg total) Monday thru Friday NONE on Saturday and Sunday   atorvastatin  (LIPITOR ) 80 MG tablet Take 1 tablet (80 mg total) by mouth daily.   dapagliflozin  propanediol (FARXIGA ) 10 MG TABS tablet Take by mouth daily.   ENTRESTO  97-103 MG Take 1 tablet by mouth 2 (two) times daily.   furosemide  (LASIX ) 20 MG tablet Take 1 tablet (20 mg total) by mouth daily.   hydrALAZINE  (APRESOLINE ) 50 MG tablet Take 1 tablet (50 mg total) by mouth 3 (three) times daily.   isosorbide  mononitrate (IMDUR ) 30 MG 24 hr tablet Take 1.5 tablets (45 mg total) by mouth daily.   TRADJENTA  5 MG TABS tablet Take 5 mg by mouth daily.   No current facility-administered medications for this visit. (Other)   REVIEW OF SYSTEMS:    ALLERGIES No Known Allergies  PAST MEDICAL HISTORY Past Medical History:  Diagnosis Date   Atrial tachycardia    CHF (congestive heart failure) (HCC)     a. EF 45% by echo in 03/2020 with normal cors by cath   Diabetes mellitus without complication (HCC)    Diarrhea    Heart disease    Herpes simplex infection    Hyperlipidemia    Hypertension    Past Surgical History:  Procedure Laterality Date   CESAREAN SECTION     CHOLECYSTECTOMY     COLONOSCOPY N/A 08/26/2014   Procedure: COLONOSCOPY;  Surgeon: [name], MD;  [facility];  Service: Endoscopy;  Laterality: N/A;  9:30 AM - moved to 8:30 - Ginger to notify pt   LEFT HEART CATH AND CORONARY ANGIOGRAPHY N/A 04/14/2020   Procedure: LEFT HEART CATH AND CORONARY ANGIOGRAPHY;  Surgeon: [name], [name], MD;  Location: [facility] INVASIVE CV LAB;  Service: Cardiovascular;  Laterality: N/A;   RIGHT/LEFT HEART CATH AND CORONARY ANGIOGRAPHY N/A 02/19/2021   Procedure: RIGHT/LEFT HEART CATH AND CORONARY ANGIOGRAPHY;  Surgeon: [name], MD;  Location: [facility] INVASIVE CV LAB;  Service: Cardiovascular;  Laterality: N/A;   SVT ABLATION N/A 09/30/2023   Procedure: SVT ABLATION;  Surgeon: [name], MD;  Location: [facility] INVASIVE CV LAB;  Service: Cardiovascular;  Laterality: N/A;   FAMILY HISTORY Family History  Problem Relation Age of Onset   Hypertension Mother    Diabetes Mother    Colon cancer Neg Hx    SOCIAL HISTORY Social History   Tobacco Use   Smoking status:  Never   Smokeless tobacco: Never  Vaping Use   Vaping status: Never Used  Substance Use Topics   Alcohol  use: No    Alcohol /week: 0.0 standard drinks of alcohol    Drug use: No       OPHTHALMIC EXAM:  Not recorded    IMAGING AND PROCEDURES  Imaging and Procedures for 10/27/2024           ASSESSMENT/PLAN:  No diagnosis found.   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, ?macroaneurysm - s/p IVA OS #1 (05.17.23), #2 (06.14.23), #3 (08.02.23), #4 (09.06.23), #5 (10.04.23),  #6 (11.06.23) -- IVA resistance =============== - s/p IVE OS #1 (12.08.23), #2 (01.17.24), #3 (03.14.24), #4 (04.25.24), #5 (06.07.24), #6 (07.29.24), #7 (09.09.24), #8 (10.22.24), #9 (12.03.24), #10 (01.14.25), #11 (02.25.25) -- IVE resistance ================ - s/p IVV OS #1 (04.08.25), #2 (05.20.25), #3 (07.01.25), #4 (08.12.25), #5 (09.24.25), #6 (10.29.25) **h/o increased fluid OS at 7 wks -- noted on 07.29.24 visit** - BCVA OS 20/40 stable - OCT OS: Persistent IRF/IRHM superior and central macula, central ORA and SRHM, central ORA and SRHM at 5 weeks - recommend IVV OS #7 today, 12.03.25 w/ f/u in 5 wks - pt wishes to proceed with injection   - RBA of procedure discussed, questions answered - IVV informed consent obtained and signed 04.08.25 (OS) - see procedure note - F/U 5 weeks -- DFE/OCT/possible injection   2,3. Hypertensive retinopathy OU - discussed importance of tight BP control - monitor  4,5. Mild nonproliferative diabetic retinopathy w/o DME OD - exam shows rare MA OU  - FA (05.17.23) shows rare MA OU; no NV OU -- BRVO OS as above - OCT OD: NFP; Focal cystic changes and IRHM in temporal macula -- persistent - BCVA OD 20/20 from 20/25 - will continue to hold off on anti-VEGF therapy OD for now  - monitor  6. Mixed Cataract OU - The symptoms of cataract, surgical options, and treatments and risks were discussed with patient. - discussed diagnosis and progression - monitor  Ophthalmic Meds Ordered this visit:  No orders of the defined types were placed in this encounter.    No follow-ups on file.  There are no Patient Instructions on file for this visit.  This document serves as a record of services personally performed by Redell JUDITHANN Hans, MD, PhD. It was created on their behalf by Almetta Pesa, an ophthalmic technician. The creation of this record is the provider's dictation and/or activities during the visit.    Electronically signed by: Almetta Pesa,  OA, 10/14/24  11:59 AM   Redell JUDITHANN Hans, M.D., Ph.D. Diseases & Surgery of the Retina and Vitreous [facility]   Abbreviations: M myopia (nearsighted); A astigmatism; H hyperopia (farsighted); P presbyopia; Mrx spectacle prescription;  CTL contact lenses; OD right eye; OS left eye; OU both eyes  XT exotropia; ET esotropia; PEK punctate epithelial keratitis; PEE punctate epithelial erosions; DES dry eye syndrome; MGD meibomian gland dysfunction; ATs artificial tears; PFAT's preservative free artificial tears; NSC nuclear sclerotic cataract; PSC posterior subcapsular cataract; ERM epi-retinal membrane; PVD posterior vitreous detachment; RD retinal detachment; DM diabetes mellitus; DR diabetic retinopathy; NPDR non-proliferative diabetic retinopathy; PDR proliferative diabetic retinopathy; CSME clinically significant macular edema; DME diabetic macular edema; dbh dot blot hemorrhages; CWS cotton wool spot; POAG primary open angle glaucoma; C/D cup-to-disc ratio; HVF humphrey  visual field; GVF goldmann visual field; OCT optical coherence tomography; IOP intraocular pressure; BRVO Branch retinal vein occlusion; CRVO central retinal vein occlusion; CRAO central retinal artery occlusion; BRAO branch retinal artery occlusion; RT retinal tear; SB scleral buckle; PPV pars plana vitrectomy; VH Vitreous hemorrhage; PRP panretinal laser photocoagulation; IVK intravitreal kenalog; VMT vitreomacular traction; MH Macular hole;  NVD neovascularization of the disc; NVE neovascularization elsewhere; AREDS age related eye disease study; ARMD age related macular degeneration; POAG primary open angle glaucoma; EBMD epithelial/anterior basement membrane dystrophy; ACIOL anterior chamber intraocular lens; IOL intraocular lens; PCIOL posterior chamber intraocular lens; Phaco/IOL phacoemulsification with intraocular lens placement; PRK photorefractive keratectomy; LASIK laser assisted in situ keratomileusis;  HTN hypertension; DM diabetes mellitus; COPD chronic obstructive pulmonary disease

## 2024-10-25 ENCOUNTER — Ambulatory Visit: Admitting: Obstetrics & Gynecology

## 2024-10-26 NOTE — Progress Notes (Signed)
 [facility] - Clinic Note  11/01/2024    CHIEF COMPLAINT Patient presents for Retina Follow Up   HISTORY OF PRESENT ILLNESS: Michelle Morrison is a 63 y.o. female who presents to the clinic today for:   HPI     Retina Follow Up   Patient presents with  CRVO/BRVO.  In left eye.  This started 5 weeks ago.  Duration of 5 weeks.  Since onset it is stable.        Comments   5 week retina follow up BRVO OS and IVV OS pt Is reporting no vision changes noticed she denies any flashes or floaters her last reading 117 last week       Last edited by [name], COT on 11/01/2024  7:52 AM.     Patient feels the vision is the same.  Referring physician: [name], [name], MD [id number] [place],  [place] [address]  HISTORICAL INFORMATION:   Selected notes from the MEDICAL RECORD NUMBER Referred by Dr. [name]: 05.08.23 Ocular Hx- NPDR OS  Patient saw Dr. [name].  Vision at that time was OD 20/20, OS 20/25    CURRENT MEDICATIONS: No current outpatient medications on file. (Ophthalmic Drugs)   No current facility-administered medications for this visit. (Ophthalmic Drugs)   Current Outpatient Medications (Other)  Medication Sig   albuterol  (VENTOLIN  HFA) 108 (90 Base) MCG/ACT inhaler Inhale 1-2 puffs into the lungs every 4 (four) hours as needed for wheezing or shortness of breath.   amiodarone  (PACERONE ) 200 MG tablet Take 1 tablet (200 mg total) Monday thru Friday NONE on Saturday and Sunday   atorvastatin  (LIPITOR ) 80 MG tablet Take 1 tablet (80 mg total) by mouth daily.   dapagliflozin  propanediol (FARXIGA ) 10 MG TABS tablet Take by mouth daily.   ENTRESTO  97-103 MG Take 1 tablet by mouth 2 (two) times daily.   furosemide  (LASIX ) 20 MG tablet Take 1 tablet (20 mg total) by mouth daily.   hydrALAZINE  (APRESOLINE ) 50 MG tablet Take 1 tablet (50 mg total) by mouth 3 (three) times daily.   isosorbide  mononitrate (IMDUR ) 30  MG 24 hr tablet Take 1.5 tablets (45 mg total) by mouth daily.   TRADJENTA  5 MG TABS tablet Take 5 mg by mouth daily.   No current facility-administered medications for this visit. (Other)   REVIEW OF SYSTEMS: ROS   Positive for: Endocrine, Cardiovascular, Eyes, Respiratory Negative for: Constitutional, Gastrointestinal, Neurological, Skin, Genitourinary, Musculoskeletal, HENT, Psychiatric, Allergic/Imm, Heme/Lymph Last edited by [name], COT on 11/01/2024  7:52 AM.       ALLERGIES No Known Allergies  PAST MEDICAL HISTORY Past Medical History:  Diagnosis Date   Atrial tachycardia    CHF (congestive heart failure) (HCC)    a. EF 45% by echo in 03/2020 with normal cors by cath   Diabetes mellitus without complication (HCC)    Diarrhea    Heart disease    Herpes simplex infection    Hyperlipidemia    Hypertension    Past Surgical History:  Procedure Laterality Date   CESAREAN SECTION     CHOLECYSTECTOMY     COLONOSCOPY N/A 08/26/2014   Procedure: COLONOSCOPY;  Surgeon: [name], MD;  Location: [facility];  Service: Endoscopy;  Laterality: N/A;  9:30 AM - moved to 8:30 - Ginger to notify pt   LEFT HEART CATH AND CORONARY ANGIOGRAPHY N/A 04/14/2020   Procedure: LEFT HEART CATH AND CORONARY ANGIOGRAPHY;  Surgeon: [name], [name], MD;  Location: [facility] INVASIVE CV LAB;  Service: Cardiovascular;  Laterality: N/A;   RIGHT/LEFT HEART CATH AND CORONARY ANGIOGRAPHY N/A 02/19/2021   Procedure: RIGHT/LEFT HEART CATH AND CORONARY ANGIOGRAPHY;  Surgeon: [name], MD;  Location: [facility] INVASIVE CV LAB;  Service: Cardiovascular;  Laterality: N/A;   SVT ABLATION N/A 09/30/2023   Procedure: SVT ABLATION;  Surgeon: [name], MD;  Location: [facility] INVASIVE CV LAB;  Service: Cardiovascular;  Laterality: N/A;   FAMILY HISTORY Family History  Problem Relation Age of Onset   Hypertension Mother    Diabetes Mother    Colon cancer Neg Hx    SOCIAL HISTORY Social History    Tobacco Use   Smoking status: Never   Smokeless tobacco: Never  Vaping Use   Vaping status: Never Used  Substance Use Topics   Alcohol  use: No    Alcohol /week: 0.0 standard drinks of alcohol    Drug use: No       OPHTHALMIC EXAM:  Base Eye Exam     Visual Acuity (Snellen - Linear)       Right Left   Dist [place] 20/25 -2 20/30 -1   Dist ph [place] NI NI         Tonometry (Tonopen, 7:58 AM)       Right Left   Pressure 17 19         Pupils       Pupils Dark Light Shape React APD   Right PERRL 3 2 Round Brisk None   Left PERRL 3 2 Round Brisk None         Visual Fields       Left Right    Full Full         Extraocular Movement       Right Left    Full, Ortho Full, Ortho         Neuro/Psych     Oriented x3: Yes   Mood/Affect: Normal         Dilation     Both eyes: 2.5% Phenylephrine @ 7:58 AM           Slit Lamp and Fundus Exam     Slit Lamp Exam       Right Left   Lids/Lashes Dermatochalasis, upper lid chalazion, Meibomian gland dysfunction Dermatochalasis - upper lid, Meibomian gland dysfunction   Conjunctiva/Sclera melanosis melanosis   Cornea trace PEE trace PEE   Anterior Chamber deep, narrow temporal angle deep, narrow temporal angle   Iris Round and dilated, No NVI Round and dilated, No NVI   Lens 2+ Nuclear sclerosis, 2+ Cortical cataract 2+ Nuclear sclerosis, 2+ Cortical cataract   Anterior Vitreous Vitreous syneresis Vitreous syneresis         Fundus Exam       Right Left   Disc Pink and sharp, PPP Pink and sharp, PPP   C/D Ratio 0.6 0.6   Macula Flat, Good foveal reflex, no frank heme or edema Blunted foveal reflex, +central edema, cystic changes superior macula, punctate perifoveal exudates, cluster of IRH superior macula   Vessels Attenuated, Tortuous Attenuated, old BRVO with macroaneurysm superior macula, mild tortuosity   Periphery Attached, rare MA Attached, rare MA           IMAGING AND PROCEDURES   Imaging and Procedures for 11/01/2024  OCT, Retina - OU - Both Eyes       Right Eye Quality was poor. Central Foveal Thickness: 224. Progression has  been stable. Findings include normal foveal contour, no IRF, no SRF, intraretinal hyper-reflective material, vitreomacular adhesion .   Left Eye Quality was good. Central Foveal Thickness: 212. Progression has been stable. Findings include no SRF, abnormal foveal contour, subretinal hyper-reflective material, intraretinal hyper-reflective material, intraretinal fluid, vitreomacular adhesion (Persistent IRF/IRHM superior and central macula, central ORA and SRHM).   Notes *Images captured and stored on drive  Diagnosis / Impression:  OD: NFP; No IRF/SRF OS: BRVO w/ CME - Persistent IRF/IRHM superior and central macula, central ORA and SRHM  Clinical management:  See below  Abbreviations: NFP - Normal foveal profile. CME - cystoid macular edema. PED - pigment epithelial detachment. IRF - intraretinal fluid. SRF - subretinal fluid. EZ - ellipsoid zone. ERM - epiretinal membrane. ORA - outer retinal atrophy. ORT - outer retinal tubulation. SRHM - subretinal hyper-reflective material. IRHM - intraretinal hyper-reflective material      Intravitreal Injection, Pharmacologic Agent - OS - Left Eye       Time Out 11/01/2024. 7:40 AM. Confirmed correct patient, procedure, site, and patient consented.   Anesthesia Topical anesthesia was used. Anesthetic medications included Lidocaine  2%, Proparacaine 0.5%.   Procedure Preparation included 5% betadine to ocular surface, eyelid speculum. A supplied (32g) needle was used.   Injection: 6 mg faricimab -svoa 6 MG/0.05ML   Route: Intravitreal, Site: Left Eye   NDC: [phone], Waste: 0 mL   Post-op Post injection exam found visual acuity of at least counting fingers. The patient tolerated the procedure well. There were no complications. The patient received written and verbal post procedure care  education. Post injection medications were not given.              ASSESSMENT/PLAN:    ICD-10-CM   1. Branch retinal vein occlusion of left eye with macular edema (HCC)  H34.8320 OCT, Retina - OU - Both Eyes    Intravitreal Injection, Pharmacologic Agent - OS - Left Eye    2. Essential hypertension  I10     3. Hypertensive retinopathy of both eyes  H35.033     4. Mild nonproliferative diabetic retinopathy of both eyes without macular edema associated with type 2 diabetes mellitus (HCC)  Z88.6706     5. Long term (current) use of oral hypoglycemic drugs  Z79.84     6. 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-related cataract of both eyes  H25.813      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, ?macroaneurysm - s/p IVA OS #1 (05.17.23), #2 (06.14.23), #3 (08.02.23), #4 (09.06.23), #5 (10.04.23), #6 (11.06.23)  -- IVA resistance =============== - s/p IVE OS #1 (12.08.23), #2 (01.17.24), #3 (03.14.24), #4 (04.25.24), #5 (06.07.24), #6 (07.29.24), #7 (09.09.24), #8 (10.22.24), #9 (12.03.24), #10 (01.14.25), #11 (02.25.25)  -- IVE resistance ================ - s/p IVV OS #1 (04.08.25), #2 (05.20.25), #3 (07.01.25), #4 (08.12.25), #5 (09.24.25), #6 (10.29.25) **h/o increased fluid OS at 7 wks -- noted on 07.29.24 visit** - BCVA OS 20/30 from 20/40  - OCT OS: Persistent IRF/IRHM superior and central macula-- slightly improved, central ORA and SRHM, central ORA and SRHM at 5+ weeks - recommend IVV OS #7 today, 12.08.25 w/ f/u in 5 wks - pt wishes to proceed with injection   - RBA of procedure discussed, questions answered - IVV informed consent obtained and signed 04.08.25 (OS) - see procedure note - F/U 5 weeks -- DFE/OCT/possible injection  2,3. Hypertensive retinopathy OU - discussed importance of tight BP control - monitor  4,5. Mild nonproliferative diabetic  retinopathy w/o DME OD - exam shows rare MA OU  - FA (05.17.23) shows rare MA OU; no NV OU -- BRVO OS as above - OCT OD: NFP; Focal cystic changes and IRHM in temporal macula -- persistent - BCVA OD 20/20 from 20/25 - will continue to hold off on anti-VEGF therapy OD for now  - monitor  6. Mixed Cataract OU - The symptoms of cataract, surgical options, and treatments and risks were discussed with patient. - discussed diagnosis and progression - monitor  Ophthalmic Meds Ordered this visit:  No orders of the defined types were placed in this encounter.    Return in about 5 weeks (around 12/06/2024) for f/u, BRVO, DFE, OCT, Possible, IVV, OS.  There are no Patient Instructions on file for this visit.  This document serves as a record of services personally performed by Redell JUDITHANN Hans, MD, PhD. It was created on their behalf by Avelina Pereyra, COA an ophthalmic technician. The creation of this record is the provider's dictation and/or activities during the visit.   Electronically signed by: Avelina GORMAN Pereyra, COT  11/01/24  8:03 AM   This document serves as a record of services personally performed by Redell JUDITHANN Hans, MD, PhD. It was created on their behalf by Wanda GEANNIE Keens, COT an ophthalmic technician. The creation of this record is the provider's dictation and/or activities during the visit.    Electronically signed by:  Wanda GEANNIE Keens, COT  11/01/24 8:03 AM   Redell JUDITHANN Hans, M.D., Ph.D. Diseases & Surgery of the Retina and Vitreous [facility] 11/08/24  Abbreviations: M myopia (nearsighted); A astigmatism; H hyperopia (farsighted); P presbyopia; Mrx spectacle prescription;  CTL contact lenses; OD right eye; OS left eye; OU both eyes  XT exotropia; ET esotropia; PEK punctate epithelial keratitis; PEE punctate epithelial erosions; DES dry eye syndrome; MGD meibomian gland dysfunction; ATs artificial tears; PFAT's preservative free artificial tears;  NSC nuclear sclerotic cataract; PSC posterior subcapsular cataract; ERM epi-retinal membrane; PVD posterior vitreous detachment; RD retinal detachment; DM diabetes mellitus; DR diabetic retinopathy; NPDR non-proliferative diabetic retinopathy; PDR proliferative diabetic retinopathy; CSME clinically significant macular edema; DME diabetic macular edema; dbh dot blot hemorrhages; CWS cotton wool spot; POAG primary open angle glaucoma; C/D cup-to-disc ratio; HVF humphrey visual field; GVF goldmann visual field; OCT optical coherence tomography; IOP intraocular pressure; BRVO Branch retinal vein occlusion; CRVO central retinal vein occlusion; CRAO central retinal artery occlusion; BRAO branch retinal artery occlusion; RT retinal tear; SB scleral buckle; PPV pars plana vitrectomy; VH Vitreous hemorrhage; PRP panretinal laser photocoagulation; IVK intravitreal kenalog; VMT vitreomacular traction; MH Macular hole;  NVD neovascularization of the disc; NVE neovascularization elsewhere; AREDS age related eye disease study; ARMD age related macular degeneration; POAG primary open angle glaucoma; EBMD epithelial/anterior basement membrane dystrophy; ACIOL anterior chamber intraocular lens; IOL intraocular lens; PCIOL posterior chamber intraocular lens; Phaco/IOL phacoemulsification with intraocular lens placement; PRK photorefractive keratectomy; LASIK laser assisted in situ keratomileusis; HTN hypertension; DM diabetes mellitus; COPD chronic obstructive pulmonary disease

## 2024-10-27 ENCOUNTER — Encounter (INDEPENDENT_AMBULATORY_CARE_PROVIDER_SITE_OTHER): Admitting: Ophthalmology

## 2024-10-27 DIAGNOSIS — H35033 Hypertensive retinopathy, bilateral: Secondary | ICD-10-CM

## 2024-10-27 DIAGNOSIS — E113293 Type 2 diabetes mellitus with mild nonproliferative diabetic retinopathy without macular edema, bilateral: Secondary | ICD-10-CM

## 2024-10-27 DIAGNOSIS — I10 Essential (primary) hypertension: Secondary | ICD-10-CM

## 2024-10-27 DIAGNOSIS — Z7984 Long term (current) use of oral hypoglycemic drugs: Secondary | ICD-10-CM

## 2024-10-27 DIAGNOSIS — H25813 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-related cataract, bilateral: Secondary | ICD-10-CM

## 2024-10-27 DIAGNOSIS — H34832 Tributary (branch) retinal vein occlusion, left eye, with macular edema: Secondary | ICD-10-CM

## 2024-11-01 ENCOUNTER — Encounter: Payer: Self-pay | Admitting: *Deleted

## 2024-11-01 ENCOUNTER — Other Ambulatory Visit: Payer: Self-pay | Admitting: *Deleted

## 2024-11-01 ENCOUNTER — Encounter (INDEPENDENT_AMBULATORY_CARE_PROVIDER_SITE_OTHER): Payer: Self-pay | Admitting: Ophthalmology

## 2024-11-01 ENCOUNTER — Ambulatory Visit (INDEPENDENT_AMBULATORY_CARE_PROVIDER_SITE_OTHER): Admitting: Ophthalmology

## 2024-11-01 ENCOUNTER — Ambulatory Visit: Admitting: Obstetrics & Gynecology

## 2024-11-01 DIAGNOSIS — H35033 Hypertensive retinopathy, bilateral: Secondary | ICD-10-CM | POA: Diagnosis not present

## 2024-11-01 DIAGNOSIS — H25813 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-related cataract, bilateral: Secondary | ICD-10-CM

## 2024-11-01 DIAGNOSIS — H34832 Tributary (branch) retinal vein occlusion, left eye, with macular edema: Secondary | ICD-10-CM

## 2024-11-01 DIAGNOSIS — Z7984 Long term (current) use of oral hypoglycemic drugs: Secondary | ICD-10-CM

## 2024-11-01 DIAGNOSIS — E113293 Type 2 diabetes mellitus with mild nonproliferative diabetic retinopathy without macular edema, bilateral: Secondary | ICD-10-CM | POA: Diagnosis not present

## 2024-11-01 DIAGNOSIS — I10 Essential (primary) hypertension: Secondary | ICD-10-CM

## 2024-11-01 MED ORDER — PEG 3350-KCL-NA BICARB-NACL 420 G PO SOLR: 4000.0000 mL | Freq: Once | ORAL | 0 refills | Status: AC

## 2024-11-01 NOTE — Telephone Encounter (Signed)
 Pt is cancelling procedure on 11/16/24 due to not having any transportation. She is asking to be rescheduled to Jan and on a Friday. Advised pt will call once we get providers schedule.

## 2024-11-01 NOTE — Telephone Encounter (Signed)
 Pt has been scheduled for 11/16/24. Instructions sent via mychart and prep sent to pharmacy

## 2024-11-01 NOTE — Telephone Encounter (Signed)
 Hold Tradjenta  and Farxiga  72 hours before colonoscopy Give Trilyte  due to CKD

## 2024-11-01 NOTE — Telephone Encounter (Signed)
 Questionnaire from recall, no referral needed

## 2024-11-02 ENCOUNTER — Encounter (INDEPENDENT_AMBULATORY_CARE_PROVIDER_SITE_OTHER): Payer: Self-pay | Admitting: Ophthalmology

## 2024-11-02 MED ORDER — FARICIMAB-SVOA 6 MG/0.05ML IZ SOSY
6.0000 mg | PREFILLED_SYRINGE | INTRAVITREAL | Status: AC | PRN
  Administered 2024-11-02: 6 mg via INTRAVITREAL

## 2024-11-03 ENCOUNTER — Ambulatory Visit (HOSPITAL_COMMUNITY)

## 2024-11-05 ENCOUNTER — Ambulatory Visit (HOSPITAL_COMMUNITY)
Admission: RE | Admit: 2024-11-05 | Discharge: 2024-11-05 | Disposition: A | Source: Ambulatory Visit | Attending: Internal Medicine | Admitting: Internal Medicine

## 2024-11-05 ENCOUNTER — Encounter (HOSPITAL_COMMUNITY): Payer: Self-pay

## 2024-11-05 ENCOUNTER — Encounter: Payer: Self-pay | Admitting: *Deleted

## 2024-11-05 DIAGNOSIS — Z1231 Encounter for screening mammogram for malignant neoplasm of breast: Secondary | ICD-10-CM

## 2024-11-05 NOTE — Telephone Encounter (Signed)
 Pt has been rescheduled for 12/10/24. Instructions mailed and sent via mychart, pt already had prep from being previously scheduled.

## 2024-11-16 ENCOUNTER — Encounter (HOSPITAL_COMMUNITY): Payer: Self-pay

## 2024-11-16 ENCOUNTER — Ambulatory Visit (HOSPITAL_COMMUNITY): Admit: 2024-11-16 | Admitting: Internal Medicine

## 2024-11-16 SURGERY — COLONOSCOPY
Anesthesia: Choice

## 2024-11-26 NOTE — Progress Notes (Signed)
 " [facility] - Clinic Note  12/06/2024    CHIEF COMPLAINT Patient presents for Retina Follow Up   HISTORY OF PRESENT ILLNESS: Michelle Morrison is a 64 y.o. female who presents to the clinic today for:   HPI     Retina Follow Up   Patient presents with  CRVO/BRVO.  In left eye.  This started 5 weeks ago.  Duration of weeks.  Since onset it is stable.  I, the attending physician,  performed the HPI with the patient and updated documentation appropriately.        Comments   5 week retina follow up BRVO OS and IVV OS pt is reporting no vision changes noticed she denies any flashes or floaters her last reading was 117 Sunday       Last edited by Valdemar Rogue, MD on 12/06/2024  5:52 PM.     Patient feels the vision is the same.  Referring physician: [name], [name], MD [address] [place],  [place] [address]  HISTORICAL INFORMATION:   Selected notes from the MEDICAL RECORD NUMBER Referred by Dr. [name]: 05.08.23 Ocular Hx- NPDR OS  Patient saw Dr. [name].  Vision at that time was OD 20/20, OS 20/25    CURRENT MEDICATIONS: No current outpatient medications on file. (Ophthalmic Drugs)   No current facility-administered medications for this visit. (Ophthalmic Drugs)   Current Outpatient Medications (Other)  Medication Sig   albuterol  (VENTOLIN  HFA) 108 (90 Base) MCG/ACT inhaler Inhale 1-2 puffs into the lungs every 4 (four) hours as needed for wheezing or shortness of breath.   amiodarone  (PACERONE ) 200 MG tablet Take 1 tablet (200 mg total) Monday thru Friday NONE on Saturday and Sunday   atorvastatin  (LIPITOR ) 80 MG tablet Take 1 tablet (80 mg total) by mouth daily.   dapagliflozin  propanediol (FARXIGA ) 10 MG TABS tablet Take by mouth daily.   ENTRESTO  97-103 MG Take 1 tablet by mouth 2 (two) times daily.   furosemide  (LASIX ) 20 MG tablet Take 1 tablet (20 mg total) by mouth daily.   hydrALAZINE  (APRESOLINE ) 50 MG tablet  Take 1 tablet (50 mg total) by mouth 3 (three) times daily.   isosorbide  mononitrate (IMDUR ) 30 MG 24 hr tablet Take 1.5 tablets (45 mg total) by mouth daily.   ondansetron  (ZOFRAN ) 4 MG tablet Take 1 tablet (4 mg total) by mouth every 8 (eight) hours as needed for nausea or vomiting.   TRADJENTA  5 MG TABS tablet Take 5 mg by mouth daily.   No current facility-administered medications for this visit. (Other)   REVIEW OF SYSTEMS: ROS   Positive for: Endocrine, Cardiovascular, Eyes, Respiratory Negative for: Constitutional, Gastrointestinal, Neurological, Skin, Genitourinary, Musculoskeletal, HENT, Psychiatric, Allergic/Imm, Heme/Lymph Last edited by [name], COT on 12/06/2024  8:03 AM.      ALLERGIES No Known Allergies  PAST MEDICAL HISTORY Past Medical History:  Diagnosis Date   Atrial tachycardia    CHF (congestive heart failure) (HCC)    a. EF 45% by echo in 03/2020 with normal cors by cath   Diabetes mellitus without complication (HCC)    Diarrhea    Heart disease    Herpes simplex infection    Hyperlipidemia    Hypertension    Past Surgical History:  Procedure Laterality Date   CESAREAN SECTION     CHOLECYSTECTOMY     COLONOSCOPY N/A 08/26/2014   Procedure: COLONOSCOPY;  Surgeon: [name], MD;  Location: [facility];  Service: Endoscopy;  Laterality: N/A;  9:30 AM - moved to 8:30 - Ginger to notify pt   LEFT HEART CATH AND CORONARY ANGIOGRAPHY N/A 04/14/2020   Procedure: LEFT HEART CATH AND CORONARY ANGIOGRAPHY;  Surgeon: [name], [name], MD;  Location: [facility] INVASIVE CV LAB;  Service: Cardiovascular;  Laterality: N/A;   RIGHT/LEFT HEART CATH AND CORONARY ANGIOGRAPHY N/A 02/19/2021   Procedure: RIGHT/LEFT HEART CATH AND CORONARY ANGIOGRAPHY;  Surgeon: [name], MD;  Location: [facility] INVASIVE CV LAB;  Service: Cardiovascular;  Laterality: N/A;   SVT ABLATION N/A 09/30/2023   Procedure: SVT ABLATION;  Surgeon: [name], MD;  Location: [facility] INVASIVE  CV LAB;  Service: Cardiovascular;  Laterality: N/A;   FAMILY HISTORY Family History  Problem Relation Age of Onset   Hypertension Mother    Diabetes Mother    Colon cancer Neg Hx    SOCIAL HISTORY Social History   Tobacco Use   Smoking status: Never   Smokeless tobacco: Never  Vaping Use   Vaping status: Never Used  Substance Use Topics   Alcohol  use: No    Alcohol /week: 0.0 standard drinks of alcohol    Drug use: No       OPHTHALMIC EXAM:  Base Eye Exam     Visual Acuity (Snellen - Linear)       Right Left   Dist [place] 20/20 -1 20/30 -3   Dist ph [place] NI NI         Tonometry (Tonopen, 8:08 AM)       Right Left   Pressure 18 19         Pupils       Pupils Dark Light Shape React APD   Right PERRL 3 2 Round Brisk None   Left PERRL 3 2 Round Brisk None         Visual Fields       Left Right    Full Full         Extraocular Movement       Right Left    Full, Ortho Full, Ortho         Neuro/Psych     Oriented x3: Yes   Mood/Affect: Normal         Dilation     Both eyes: 2.5% Phenylephrine @ 8:08 AM           Slit Lamp and Fundus Exam     Slit Lamp Exam       Right Left   Lids/Lashes Dermatochalasis, upper lid chalazion, Meibomian gland dysfunction Dermatochalasis - upper lid, Meibomian gland dysfunction   Conjunctiva/Sclera melanosis melanosis   Cornea trace PEE trace PEE   Anterior Chamber deep, narrow temporal angle deep, narrow temporal angle   Iris Round and dilated, No NVI Round and dilated, No NVI   Lens 2+ Nuclear sclerosis, 2+ Cortical cataract 2+ Nuclear sclerosis, 2+ Cortical cataract   Anterior Vitreous Vitreous syneresis Vitreous syneresis         Fundus Exam       Right Left   Disc Pink and sharp, PPP Pink and sharp, PPP   C/D Ratio 0.6 0.6   Macula Flat, Good foveal reflex, no frank heme or edema Blunted foveal reflex, +central edema, cystic changes superior macula, punctate perifoveal exudates, cluster of  IRH superior macula--all slightly improved   Vessels Attenuated, Tortuous Attenuated, old BRVO with macroaneurysm superior macula, mild tortuosity   Periphery Attached, rare MA Attached, rare MA  IMAGING AND PROCEDURES  Imaging and Procedures for 12/06/2024  OCT, Retina - OU - Both Eyes       Right Eye Quality was poor. Central Foveal Thickness: 223. Progression has been stable. Findings include normal foveal contour, no IRF, no SRF, intraretinal hyper-reflective material, vitreomacular adhesion .   Left Eye Quality was good. Central Foveal Thickness: 207. Progression has improved. Findings include no SRF, abnormal foveal contour, subretinal hyper-reflective material, intraretinal hyper-reflective material, intraretinal fluid, vitreomacular adhesion (Persistent IRF/IRHM superior and central macula-- slightly improved, central ORA and SRHM).   Notes *Images captured and stored on drive  Diagnosis / Impression:  OD: NFP; No IRF/SRF OS: BRVO w/ CME - Persistent IRF/IRHM superior and central macula-- slightly improved, central ORA and SRHM  Clinical management:  See below  Abbreviations: NFP - Normal foveal profile. CME - cystoid macular edema. PED - pigment epithelial detachment. IRF - intraretinal fluid. SRF - subretinal fluid. EZ - ellipsoid zone. ERM - epiretinal membrane. ORA - outer retinal atrophy. ORT - outer retinal tubulation. SRHM - subretinal hyper-reflective material. IRHM - intraretinal hyper-reflective material      Intravitreal Injection, Pharmacologic Agent - OS - Left Eye       Time Out 12/06/2024. 7:34 AM. Confirmed correct patient, procedure, site, and patient consented.   Anesthesia Topical anesthesia was used. Anesthetic medications included Lidocaine  2%, Proparacaine 0.5%.   Procedure Preparation included 5% betadine to ocular surface, eyelid speculum. A supplied (32g) needle was used.   Injection: 6 mg faricimab -svoa 6 MG/0.05ML   Route:  Intravitreal, Site: Left Eye   NDC: 49757-903-93, Lot: [id number], Expiration date: 11/24/2025, Waste: 0 mL   Post-op Post injection exam found visual acuity of at least counting fingers. The patient tolerated the procedure well. There were no complications. The patient received written and verbal post procedure care education. Post injection medications were not given.             ASSESSMENT/PLAN:    ICD-10-CM   1. Branch retinal vein occlusion of left eye with macular edema (HCC)  H34.8320 OCT, Retina - OU - Both Eyes    Intravitreal Injection, Pharmacologic Agent - OS - Left Eye    faricimab -svoa (VABYSMO ) 6mg /0.34mL intravitreal injection    2. Essential hypertension  I10     3. Hypertensive retinopathy of both eyes  H35.033     4. Mild nonproliferative diabetic retinopathy of both eyes without macular edema associated with type 2 diabetes mellitus (HCC)  Z88.6706     5. Long term (current) use of oral hypoglycemic drugs  Z79.84     6. 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-related cataract of both eyes  H25.813      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, ?macroaneurysm - s/p IVA OS #1 (05.17.23), #2 (06.14.23), #3 (08.02.23), #4 (09.06.23), #5 (10.04.23), #6 (11.06.23)  -- IVA resistance =============== - s/p IVE OS #1 (12.08.23), #2 (01.17.24), #3 (03.14.24), #4 (04.25.24), #5 (06.07.24), #6 (07.29.24), #7 (09.09.24), #8 (10.22.24), #9 (12.03.24), #10 (01.14.25), #11 (02.25.25)  -- IVE resistance ================ - s/p IVV OS #1 (04.08.25), #2 (05.20.25), #3 (07.01.25), #4 (08.12.25), #5 (09.24.25), #6 (10.29.25), #7 (12.08.25) **h/o increased fluid OS at 7 wks -- noted on 07.29.24 visit** - BCVA OS 20/30 - stable - OCT OS: Persistent IRF/IRHM superior and central macula-- slightly improved, central ORA and SRHM at 5 weeks - recommend IVV  OS #8 today,  01.12.26 w/ f/u in 5 wks - pt wishes to proceed with injection   - RBA of procedure discussed, questions answered - IVV informed consent obtained and signed 04.08.25 (OS) - see procedure note - F/U 5 weeks -- DFE/OCT/possible injection  2,3. Hypertensive retinopathy OU - discussed importance of tight BP control - monitor  4,5. Mild nonproliferative diabetic retinopathy w/o DME OD - exam shows rare MA OU  - FA (05.17.23) shows rare MA OU; no NV OU -- BRVO OS as above - OCT OD: NFP; Focal cystic changes and IRHM in temporal macula -- persistent - BCVA OD 20/20- stable - will continue to hold off on anti-VEGF therapy OD for now  - monitor  6. Mixed Cataract OU - The symptoms of cataract, surgical options, and treatments and risks were discussed with patient. - discussed diagnosis and progression - monitor  Ophthalmic Meds Ordered this visit:  Meds ordered this encounter  Medications   faricimab -svoa (VABYSMO ) 6mg /0.76mL intravitreal injection     Return in about 5 weeks (around 01/10/2025) for f/u, BRVO, DFE, OCT, IVV, OS.  There are no Patient Instructions on file for this visit.  This document serves as a record of services personally performed by Redell JUDITHANN Hans, MD, PhD. It was created on their behalf by Paulina Jamse Gay an ophthalmic technician. The creation of this record is the provider's dictation and/or activities during the visit.   Electronically signed by: Alana D Fowler  12/13/24  1:05 AM   This document serves as a record of services personally performed by Redell JUDITHANN Hans, MD, PhD. It was created on their behalf by Wanda GEANNIE Keens, COT an ophthalmic technician. The creation of this record is the provider's dictation and/or activities during the visit.    Electronically signed by:  Wanda GEANNIE Keens, COT  12/13/24 1:05 AM  Redell JUDITHANN Hans, M.D., Ph.D. Diseases & Surgery of the Retina and Vitreous [facility]  I have reviewed the  above documentation for accuracy and completeness, and I agree with the above. Redell JUDITHANN Hans, M.D., Ph.D. 12/13/24 1:06 AM   Abbreviations: M myopia (nearsighted); A astigmatism; H hyperopia (farsighted); P presbyopia; Mrx spectacle prescription;  CTL contact lenses; OD right eye; OS left eye; OU both eyes  XT exotropia; ET esotropia; PEK punctate epithelial keratitis; PEE punctate epithelial erosions; DES dry eye syndrome; MGD meibomian gland dysfunction; ATs artificial tears; PFAT's preservative free artificial tears; NSC nuclear sclerotic cataract; PSC posterior subcapsular cataract; ERM epi-retinal membrane; PVD posterior vitreous detachment; RD retinal detachment; DM diabetes mellitus; DR diabetic retinopathy; NPDR non-proliferative diabetic retinopathy; PDR proliferative diabetic retinopathy; CSME clinically significant macular edema; DME diabetic macular edema; dbh dot blot hemorrhages; CWS cotton wool spot; POAG primary open angle glaucoma; C/D cup-to-disc ratio; HVF humphrey visual field; GVF goldmann visual field; OCT optical coherence tomography; IOP intraocular pressure; BRVO Branch retinal vein occlusion; CRVO central retinal vein occlusion; CRAO central retinal artery occlusion; BRAO branch retinal artery occlusion; RT retinal tear; SB scleral buckle; PPV pars plana vitrectomy; VH Vitreous hemorrhage; PRP panretinal laser photocoagulation; IVK intravitreal kenalog; VMT vitreomacular traction; MH Macular hole;  NVD neovascularization of the disc; NVE neovascularization elsewhere; AREDS age related eye disease study; ARMD age related macular degeneration; POAG primary open angle glaucoma; EBMD epithelial/anterior basement membrane dystrophy; ACIOL anterior chamber intraocular lens; IOL intraocular lens; PCIOL posterior chamber intraocular lens; Phaco/IOL phacoemulsification with intraocular lens placement; PRK photorefractive keratectomy; LASIK laser assisted in situ keratomileusis; HTN  hypertension; DM diabetes mellitus; COPD chronic obstructive pulmonary disease "

## 2024-12-06 ENCOUNTER — Ambulatory Visit (INDEPENDENT_AMBULATORY_CARE_PROVIDER_SITE_OTHER): Admitting: Ophthalmology

## 2024-12-06 ENCOUNTER — Encounter (INDEPENDENT_AMBULATORY_CARE_PROVIDER_SITE_OTHER): Payer: Self-pay | Admitting: Ophthalmology

## 2024-12-06 DIAGNOSIS — I10 Essential (primary) hypertension: Secondary | ICD-10-CM

## 2024-12-06 DIAGNOSIS — H34832 Tributary (branch) retinal vein occlusion, left eye, with macular edema: Secondary | ICD-10-CM

## 2024-12-06 DIAGNOSIS — E113293 Type 2 diabetes mellitus with mild nonproliferative diabetic retinopathy without macular edema, bilateral: Secondary | ICD-10-CM | POA: Diagnosis not present

## 2024-12-06 DIAGNOSIS — Z7984 Long term (current) use of oral hypoglycemic drugs: Secondary | ICD-10-CM | POA: Diagnosis not present

## 2024-12-06 DIAGNOSIS — H35033 Hypertensive retinopathy, bilateral: Secondary | ICD-10-CM

## 2024-12-06 DIAGNOSIS — H25813 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-related cataract, bilateral: Secondary | ICD-10-CM

## 2024-12-06 MED ORDER — FARICIMAB-SVOA 6 MG/0.05ML IZ SOSY
6.0000 mg | PREFILLED_SYRINGE | INTRAVITREAL | Status: AC | PRN
  Administered 2024-12-06: 6 mg via INTRAVITREAL

## 2024-12-07 ENCOUNTER — Encounter (HOSPITAL_COMMUNITY)
Admission: RE | Admit: 2024-12-07 | Discharge: 2024-12-07 | Disposition: A | Discharge status: Home / Self Care | Source: Ambulatory Visit | Attending: Internal Medicine | Admitting: Internal Medicine

## 2024-12-07 ENCOUNTER — Telehealth (INDEPENDENT_AMBULATORY_CARE_PROVIDER_SITE_OTHER): Payer: Self-pay

## 2024-12-07 NOTE — Telephone Encounter (Signed)
 I spoke with the patient and she says someone from the office called her and explained the prep directions to her and she no longer needs my assistance.

## 2024-12-07 NOTE — Pre-Procedure Instructions (Signed)
Attempted pre-op phone call. Left Vm for her to call us back.

## 2024-12-07 NOTE — Telephone Encounter (Signed)
 I called and got the patient vm, I asked if she still needed us  to call the [facility] clinic back.   Patient called and left a vm on nurse line she had a question prior to her procedure (TCS) on 12/10/2024. Patient left the number of [phone].

## 2024-12-08 ENCOUNTER — Other Ambulatory Visit: Payer: Self-pay

## 2024-12-08 ENCOUNTER — Encounter (HOSPITAL_COMMUNITY): Payer: Self-pay

## 2024-12-08 ENCOUNTER — Other Ambulatory Visit: Payer: Self-pay | Admitting: Internal Medicine

## 2024-12-08 MED ORDER — ONDANSETRON HCL 4 MG PO TABS: 4.0000 mg | ORAL_TABLET | Freq: Three times a day (TID) | ORAL | 0 refills | Status: AC | PRN

## 2024-12-08 NOTE — Telephone Encounter (Signed)
 Per Dr. Cindie I sent in course of ondansetron  to patient's pharmacy. She has a history of prolonged QT and ondansetron  can make this worse so would recommend she take as few as she needs. Certainly do not take more than 3 tablets in a 24-hour period.   Called pt and made aware of above. She stated she has had this before and she did fine with it but will follow recommendations. She stated she will just take 1 tablet 30 minutes before her prep and see how she does.

## 2024-12-08 NOTE — Telephone Encounter (Signed)
 Patient stated liquid prep makes her very nauseated. She wants to know if something can be called in to help her keep the prep down. Message sent to Dr. Cindie.

## 2024-12-08 NOTE — Telephone Encounter (Signed)
 Pt phoned this morning 5 times between 8:30---8:35am regarding her wanting a different prep for her procedure. I explained to the pt that this will have to be sent to the schedulers phone to handle. Transferred call to the schedulers.

## 2024-12-10 ENCOUNTER — Encounter (INDEPENDENT_AMBULATORY_CARE_PROVIDER_SITE_OTHER): Payer: Self-pay | Admitting: *Deleted

## 2024-12-10 ENCOUNTER — Encounter (HOSPITAL_COMMUNITY): Admission: RE | Disposition: A | Payer: Self-pay | Source: Home / Self Care | Attending: Internal Medicine

## 2024-12-10 ENCOUNTER — Encounter (HOSPITAL_COMMUNITY): Payer: Self-pay | Admitting: Internal Medicine

## 2024-12-10 ENCOUNTER — Ambulatory Visit (HOSPITAL_COMMUNITY)
Admission: RE | Admit: 2024-12-10 | Discharge: 2024-12-10 | Disposition: A | Discharge status: Home / Self Care | Attending: Internal Medicine | Admitting: Internal Medicine

## 2024-12-10 ENCOUNTER — Ambulatory Visit (HOSPITAL_COMMUNITY): Admitting: Anesthesiology

## 2024-12-10 DIAGNOSIS — D123 Benign neoplasm of transverse colon: Secondary | ICD-10-CM | POA: Insufficient documentation

## 2024-12-10 DIAGNOSIS — I509 Heart failure, unspecified: Secondary | ICD-10-CM | POA: Diagnosis not present

## 2024-12-10 DIAGNOSIS — K648 Other hemorrhoids: Secondary | ICD-10-CM | POA: Insufficient documentation

## 2024-12-10 DIAGNOSIS — I110 Hypertensive heart disease with heart failure: Secondary | ICD-10-CM | POA: Diagnosis not present

## 2024-12-10 DIAGNOSIS — E119 Type 2 diabetes mellitus without complications: Secondary | ICD-10-CM | POA: Insufficient documentation

## 2024-12-10 DIAGNOSIS — Z1211 Encounter for screening for malignant neoplasm of colon: Secondary | ICD-10-CM | POA: Insufficient documentation

## 2024-12-10 DIAGNOSIS — K635 Polyp of colon: Secondary | ICD-10-CM | POA: Diagnosis not present

## 2024-12-10 HISTORY — PX: POLYPECTOMY: SHX149

## 2024-12-10 HISTORY — PX: COLONOSCOPY: SHX5424

## 2024-12-10 LAB — HM COLONOSCOPY

## 2024-12-10 LAB — GLUCOSE, CAPILLARY: Glucose-Capillary: 109 mg/dL — ABNORMAL HIGH (ref 70–99)

## 2024-12-10 MED ORDER — LACTATED RINGERS IV SOLN: INTRAVENOUS | Status: DC

## 2024-12-10 MED ORDER — PROPOFOL 500 MG/50ML IV EMUL
INTRAVENOUS | Status: DC | PRN
  Administered 2024-12-10: 80 mg via INTRAVENOUS
  Administered 2024-12-10: 150 ug/kg/min via INTRAVENOUS

## 2024-12-10 NOTE — Discharge Instructions (Addendum)
" °  Colonoscopy Discharge Instructions  Read the instructions outlined below and refer to this sheet in the next few weeks. These discharge instructions provide you with general information on caring for yourself after you leave the hospital. Your doctor may also give you specific instructions. While your treatment has been planned according to the most current medical practices available, unavoidable complications occasionally occur.   ACTIVITY You may resume your regular activity, but move at a slower pace for the next 24 hours.  Take frequent rest periods for the next 24 hours.  Walking will help get rid of the air and reduce the bloated feeling in your belly (abdomen).  No driving for 24 hours (because of the medicine (anesthesia) used during the test).   Do not sign any important legal documents or operate any machinery for 24 hours (because of the anesthesia used during the test).  NUTRITION Drink plenty of fluids.  You may resume your normal diet as instructed by your doctor.  Begin with a light meal and progress to your normal diet. Heavy or fried foods are harder to digest and may make you feel sick to your stomach (nauseated).  Avoid alcoholic beverages for 24 hours or as instructed.  MEDICATIONS You may resume your normal medications unless your doctor tells you otherwise.  WHAT YOU CAN EXPECT TODAY Some feelings of bloating in the abdomen.  Passage of more gas than usual.  Spotting of blood in your stool or on the toilet paper.  IF YOU HAD POLYPS REMOVED DURING THE COLONOSCOPY: No aspirin  products for 7 days or as instructed.  No alcohol  for 7 days or as instructed.  Eat a soft diet for the next 24 hours.  FINDING OUT THE RESULTS OF YOUR TEST Not all test results are available during your visit. If your test results are not back during the visit, make an appointment with your caregiver to find out the results. Do not assume everything is normal if you have not heard from your  caregiver or the medical facility. It is important for you to follow up on all of your test results.  SEEK IMMEDIATE MEDICAL ATTENTION IF: You have more than a spotting of blood in your stool.  Your belly is swollen (abdominal distention).  You are nauseated or vomiting.  You have a temperature over 101.  You have abdominal pain or discomfort that is severe or gets worse throughout the day.   Your colonoscopy revealed 3 polyp(s) which I removed successfully. Await pathology results, my office will contact you. I recommend repeating colonoscopy in 5-7 years for surveillance purposes, depending on pathology results.  Otherwise follow up with GI as needed.     I hope you have a great rest of your week!  [name]. Cindie, D.O. Gastroenterology and Hepatology [facility]  "

## 2024-12-10 NOTE — H&P (Signed)
 Primary Care Physician:  Carlette Benita Area, MD Primary Gastroenterologist:  Dr. Cindie  Pre-Procedure History & Physical: HPI:  Michelle Morrison is a 64 y.o. female is here for a colonoscopy for colon cancer screening purposes.  Patient denies any family history of colorectal cancer.  No melena or hematochezia.   Past Medical History:  Diagnosis Date   Atrial tachycardia    CHF (congestive heart failure) (HCC)    a. EF 45% by echo in 03/2020 with normal cors by cath   Diabetes mellitus without complication (HCC)    Diarrhea    Heart disease    Herpes simplex infection    Hyperlipidemia    Hypertension     Past Surgical History:  Procedure Laterality Date   CESAREAN SECTION     CHOLECYSTECTOMY     COLONOSCOPY N/A 08/26/2014   Procedure: COLONOSCOPY;  Surgeon: [name], MD;  Location: [facility];  Service: Endoscopy;  Laterality: N/A;  9:30 AM - moved to 8:30 - Ginger to notify pt   LEFT HEART CATH AND CORONARY ANGIOGRAPHY N/A 04/14/2020   Procedure: LEFT HEART CATH AND CORONARY ANGIOGRAPHY;  Surgeon: [name], [name], MD;  Location: [facility] INVASIVE CV LAB;  Service: Cardiovascular;  Laterality: N/A;   RIGHT/LEFT HEART CATH AND CORONARY ANGIOGRAPHY N/A 02/19/2021   Procedure: RIGHT/LEFT HEART CATH AND CORONARY ANGIOGRAPHY;  Surgeon: [name], MD;  Location: [facility] INVASIVE CV LAB;  Service: Cardiovascular;  Laterality: N/A;   SVT ABLATION N/A 09/30/2023   Procedure: SVT ABLATION;  Surgeon: [name], MD;  Location: [facility] INVASIVE CV LAB;  Service: Cardiovascular;  Laterality: N/A;    Prior to Admission medications  Medication Sig Start Date End Date Taking? Authorizing Provider  amiodarone  (PACERONE ) 200 MG tablet Take 1 tablet (200 mg total) Monday thru Friday NONE on Saturday and Sunday 03/29/24  Yes [name], MD  atorvastatin  (LIPITOR ) 80 MG tablet Take 1 tablet (80 mg total) by mouth daily. 03/14/21  Yes BranchDorn FALCON, MD  ENTRESTO  97-103 MG Take 1 tablet by  mouth 2 (two) times daily. 04/15/24  Yes [provider]  furosemide  (LASIX ) 20 MG tablet Take 1 tablet (20 mg total) by mouth daily. 04/15/24  Yes BranchDorn FALCON, MD  hydrALAZINE  (APRESOLINE ) 50 MG tablet Take 1 tablet (50 mg total) by mouth 3 (three) times daily. 04/15/24 12/10/24 Yes BranchDorn FALCON, MD  isosorbide  mononitrate (IMDUR ) 30 MG 24 hr tablet Take 1.5 tablets (45 mg total) by mouth daily. 03/29/24 12/10/24 Yes [name], MD  albuterol  (VENTOLIN  HFA) 108 [phone] Base) MCG/ACT inhaler Inhale 1-2 puffs into the lungs every 4 (four) hours as needed for wheezing or shortness of breath. 05/30/20   [provider]  dapagliflozin  propanediol (FARXIGA ) 10 MG TABS tablet Take by mouth daily.    [provider]  ondansetron  (ZOFRAN ) 4 MG tablet Take 1 tablet (4 mg total) by mouth every 8 (eight) hours as needed for nausea or vomiting. 12/08/24   Cindie [name], DO  TRADJENTA  5 MG TABS tablet Take 5 mg by mouth daily. 08/12/22   [provider]    Allergies as of 11/05/2024   (No Known Allergies)    Family History  Problem Relation Age of Onset   Hypertension Mother    Diabetes Mother    Colon cancer Neg Hx     Social History   Socioeconomic History   Marital status: Single    Spouse name: Not on file  Number of children: Not on file   Years of education: Not on file   Highest education level: Not on file  Occupational History   Not on file  Tobacco Use   Smoking status: Never   Smokeless tobacco: Never  Vaping Use   Vaping status: Never Used  Substance and Sexual Activity   Alcohol  use: No    Alcohol /week: 0.0 standard drinks of alcohol    Drug use: No   Sexual activity: Not Currently    Birth control/protection: Post-menopausal, Abstinence  Other Topics Concern   Not on file  Social History Narrative   Not on file   Social Drivers of Health   Tobacco Use: Low Risk (12/10/2024)   Patient History    Smoking Tobacco Use: Never     Smokeless Tobacco Use: Never    Passive Exposure: Not on file  Financial Resource Strain: Not on file  Food Insecurity: No Food Insecurity (09/09/2023)   Hunger Vital Sign    Worried About Running Out of Food in the Last Year: Never true    Ran Out of Food in the Last Year: Never true  Transportation Needs: No Transportation Needs (09/09/2023)   PRAPARE - Administrator, Civil Service (Medical): No    Lack of Transportation (Non-Medical): No  Physical Activity: Not on file  Stress: Not on file  Social Connections: Not on file  Intimate Partner Violence: Not At Risk (09/09/2023)   Humiliation, Afraid, Rape, and Kick questionnaire    Fear of Current or Ex-Partner: No    Emotionally Abused: No    Physically Abused: No    Sexually Abused: No  Depression (PHQ2-9): Not on file  Alcohol  Screen: Not on file  Housing: Low Risk (09/09/2023)   Housing    Last Housing Risk Score: 0  Utilities: Not At Risk (09/09/2023)   AHC Utilities    Threatened with loss of utilities: No  Health Literacy: Not on file    Review of Systems: See HPI, otherwise negative ROS  Physical Exam: Vital signs in last 24 hours: Temp:  [98.3 F (36.8 C)] 98.3 F (36.8 C) (01/16 0645) Resp:  [16] 16 (01/16 0645) BP: (199)/(73) 199/73 (01/16 0645) SpO2:  [99 %] 99 % (01/16 0645) Weight:  [85.5 kg] 85.5 kg (01/16 0615)   General:   Alert,  Well-developed, well-nourished, pleasant and cooperative in NAD Head:  Normocephalic and atraumatic. Eyes:  Sclera clear, no icterus.   Conjunctiva pink. Ears:  Normal auditory acuity. Nose:  No deformity, discharge,  or lesions. Msk:  Symmetrical without gross deformities. Normal posture. Extremities:  Without clubbing or edema. Neurologic:  Alert and  oriented x4;  grossly normal neurologically. Skin:  Intact without significant lesions or rashes. Psych:  Alert and cooperative. Normal mood and affect.  Impression/Plan: Michelle Morrison is here for a  colonoscopy to be performed for colon cancer screening purposes.  The risks of the procedure including infection, bleed, or perforation as well as benefits, limitations, alternatives and imponderables have been reviewed with the patient. Questions have been answered. All parties agreeable.

## 2024-12-10 NOTE — Anesthesia Preprocedure Evaluation (Signed)
"                                    Anesthesia Evaluation  Patient identified by MRN, date of birth, ID band Patient awake    Reviewed: Allergy & Precautions, H&P , NPO status , Patient's Chart, lab work & pertinent test results, reviewed documented beta blocker date and time   Airway Mallampati: II  TM Distance: >3 FB Neck ROM: full    Dental no notable dental hx.    Pulmonary neg pulmonary ROS   Pulmonary exam normal breath sounds clear to auscultation       Cardiovascular Exercise Tolerance: Good hypertension, +CHF   Rhythm:regular Rate:Normal     Neuro/Psych negative neurological ROS  negative psych ROS   GI/Hepatic negative GI ROS, Neg liver ROS,,,  Endo/Other  diabetes    Renal/GU Renal disease  negative genitourinary   Musculoskeletal   Abdominal   Peds  Hematology negative hematology ROS (+)   Anesthesia Other Findings   Reproductive/Obstetrics negative OB ROS                              Anesthesia Physical Anesthesia Plan  ASA: 2  Anesthesia Plan: MAC   Post-op Pain Management:    Induction:   PONV Risk Score and Plan: Propofol  infusion  Airway Management Planned:   Additional Equipment:   Intra-op Plan:   Post-operative Plan:   Informed Consent: I have reviewed the patients History and Physical, chart, labs and discussed the procedure including the risks, benefits and alternatives for the proposed anesthesia with the patient or authorized representative who has indicated his/her understanding and acceptance.     Dental Advisory Given  Plan Discussed with: CRNA  Anesthesia Plan Comments:         Anesthesia Quick Evaluation  "

## 2024-12-10 NOTE — Transfer of Care (Signed)
 Immediate Anesthesia Transfer of Care Note  Patient: Michelle Morrison  Procedure(s) Performed: COLONOSCOPY POLYPECTOMY, INTESTINE  Patient Location: Endoscopy Unit  Anesthesia Type:General  Level of Consciousness: drowsy  Airway & Oxygen  Therapy: Patient Spontanous Breathing  Post-op Assessment: Report given to RN and Post -op Vital signs reviewed and stable  Post vital signs: Reviewed and stable  Last Vitals:  Vitals Value Taken Time  BP 87/29 12/10/24 07:52  Temp    Pulse 56 12/10/24 07:54  Resp 23 12/10/24 07:54  SpO2 98 % 12/10/24 07:54  Vitals shown include unfiled device data.  Last Pain:  Vitals:   12/10/24 0732  PainSc: 0-No pain         Complications: No notable events documented.

## 2024-12-10 NOTE — Op Note (Signed)
 [facility] Patient Name: Michelle Morrison Procedure Date: 12/10/2024 7:17 AM MRN: [id number] Date of Birth: October 09, 1961 Attending MD: [name]. Cindie , [place], [id number] CSN: [id number] Age: 64 Admit Type: Outpatient Procedure:                Colonoscopy Indications:              Screening for colorectal malignant neoplasm Providers:                [name]. Cindie, DO, Jon LABOR. Gerome RN, RN,                            Olam Ada, RN Referring MD:              Medicines:                See the Anesthesia note for documentation of the                            administered medications Complications:            No immediate complications. Estimated Blood Loss:     Estimated blood loss was minimal. Procedure:                Pre-Anesthesia Assessment:                           - The anesthesia plan was to use monitored                            anesthesia care (MAC).                           After obtaining informed consent, the colonoscope                            was passed under direct vision. Throughout the                            procedure, the patient's blood pressure, pulse, and                            oxygen  saturations were monitored continuously. The                            PCF-[id number] ([id number]) was introduced through the                            anus and advanced to the the cecum, identified by                            appendiceal orifice and ileocecal valve. The                            colonoscopy was performed without difficulty. The                            patient tolerated the procedure  well. The quality                            of the bowel preparation was evaluated using the                            BBPS [facility] Bowel Preparation Scale) with scores                            of: Right Colon = 3, Transverse Colon = 3 and Left                            Colon = 3 (entire mucosa seen well with no residual                            staining,  small fragments of stool or opaque                            liquid). The total BBPS score equals 9. Scope In: 7:35:28 AM Scope Out: 7:49:24 AM Scope Withdrawal Time: 0 hours 10 minutes 45 seconds  Total Procedure Duration: 0 hours 13 minutes 56 seconds  Findings:      Non-bleeding internal hemorrhoids were found.      Three sessile polyps were found in the transverse colon. The polyps were       4 to 5 mm in size. These polyps were removed with a cold snare.       Resection and retrieval were complete.      The exam was otherwise without abnormality. Impression:               - Non-bleeding internal hemorrhoids.                           - Three 4 to 5 mm polyps in the transverse colon,                            removed with a cold snare. Resected and retrieved.                           - The examination was otherwise normal. Moderate Sedation:      Per Anesthesia Care Recommendation:           - Patient has a contact number available for                            emergencies. The signs and symptoms of potential                            delayed complications were discussed with the                            patient. Return to normal activities tomorrow.                            Written discharge instructions were provided to the  patient.                           - Resume previous diet.                           - Continue present medications.                           - Await pathology results.                           - Repeat colonoscopy in 5-7 years depending on                            pathology results for surveillance.                           - Return to GI clinic PRN. Procedure Code(s):        --- Professional ---                           [phone], Colonoscopy, flexible; with removal of                            tumor(s), polyp(s), or other lesion(s) by snare                            technique Diagnosis Code(s):        ---  Professional ---                           Z12.11, Encounter for screening for malignant                            neoplasm of colon                           D12.3, Benign neoplasm of transverse colon (hepatic                            flexure or splenic flexure)                           K64.8, Other hemorrhoids CPT copyright 2022 American [employer]. All rights reserved. The codes documented in this report are preliminary and upon coder review may  be revised to meet current compliance requirements. [name]. Cindie, DO [name]. Cindie, DO 12/10/2024 7:52:33 AM This report has been signed electronically. Number of Addenda: 0

## 2024-12-10 NOTE — Anesthesia Postprocedure Evaluation (Signed)
"   Anesthesia Post Note  Patient: Michelle Morrison  Procedure(s) Performed: COLONOSCOPY POLYPECTOMY, INTESTINE  Patient location during evaluation: Phase II Anesthesia Type: MAC Level of consciousness: awake Pain management: pain level controlled Vital Signs Assessment: post-procedure vital signs reviewed and stable Respiratory status: spontaneous breathing and respiratory function stable Cardiovascular status: blood pressure returned to baseline and stable Postop Assessment: no headache and no apparent nausea or vomiting Anesthetic complications: no Comments: Late entry   No notable events documented.   Last Vitals:  Vitals:   12/10/24 0809 12/10/24 0815  BP: (!) 105/42 (!) 128/54  Pulse: (!) 47 (!) 51  Resp: 14   Temp:    SpO2: 98% 98%    Last Pain:  Vitals:   12/10/24 0752  TempSrc: Oral  PainSc: 0-No pain                 Yvonna PARAS Jacquan Savas      "

## 2024-12-13 ENCOUNTER — Encounter (INDEPENDENT_AMBULATORY_CARE_PROVIDER_SITE_OTHER): Payer: Self-pay | Admitting: Ophthalmology

## 2024-12-14 ENCOUNTER — Encounter (HOSPITAL_COMMUNITY): Payer: Self-pay | Admitting: Internal Medicine

## 2024-12-14 LAB — SURGICAL PATHOLOGY

## 2024-12-17 ENCOUNTER — Ambulatory Visit: Payer: Self-pay | Admitting: Internal Medicine

## 2024-12-27 ENCOUNTER — Ambulatory Visit: Admitting: Obstetrics & Gynecology

## 2024-12-29 NOTE — Progress Notes (Signed)
7 yr TCS noted in recall  

## 2025-01-10 ENCOUNTER — Encounter (INDEPENDENT_AMBULATORY_CARE_PROVIDER_SITE_OTHER): Admitting: Ophthalmology

## 2025-01-10 DIAGNOSIS — H25813 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-related cataract, bilateral: Secondary | ICD-10-CM

## 2025-01-10 DIAGNOSIS — I10 Essential (primary) hypertension: Secondary | ICD-10-CM

## 2025-01-10 DIAGNOSIS — H35033 Hypertensive retinopathy, bilateral: Secondary | ICD-10-CM

## 2025-01-10 DIAGNOSIS — H34832 Tributary (branch) retinal vein occlusion, left eye, with macular edema: Secondary | ICD-10-CM

## 2025-01-10 DIAGNOSIS — Z7984 Long term (current) use of oral hypoglycemic drugs: Secondary | ICD-10-CM

## 2025-01-10 DIAGNOSIS — E113293 Type 2 diabetes mellitus with mild nonproliferative diabetic retinopathy without macular edema, bilateral: Secondary | ICD-10-CM

## 2025-02-03 ENCOUNTER — Ambulatory Visit: Admitting: Obstetrics & Gynecology
